# Patient Record
Sex: Female | Born: 1950
Health system: Southern US, Community
[De-identification: ages and names within clinical notes are randomized; demographics above are authoritative.]

## PROBLEM LIST (undated history)

## (undated) DIAGNOSIS — S0990XA Unspecified injury of head, initial encounter: Secondary | ICD-10-CM

## (undated) DIAGNOSIS — H269 Unspecified cataract: Secondary | ICD-10-CM

## (undated) DIAGNOSIS — J301 Allergic rhinitis due to pollen: Secondary | ICD-10-CM

## (undated) DIAGNOSIS — F988 Other specified behavioral and emotional disorders with onset usually occurring in childhood and adolescence: Secondary | ICD-10-CM

## (undated) DIAGNOSIS — R413 Other amnesia: Secondary | ICD-10-CM

## (undated) DIAGNOSIS — D649 Anemia, unspecified: Secondary | ICD-10-CM

## (undated) DIAGNOSIS — G2581 Restless legs syndrome: Secondary | ICD-10-CM

## (undated) DIAGNOSIS — G4730 Sleep apnea, unspecified: Secondary | ICD-10-CM

## (undated) DIAGNOSIS — G4733 Obstructive sleep apnea (adult) (pediatric): Secondary | ICD-10-CM

## (undated) DIAGNOSIS — Z9989 Dependence on other enabling machines and devices: Secondary | ICD-10-CM

## (undated) DIAGNOSIS — K219 Gastro-esophageal reflux disease without esophagitis: Secondary | ICD-10-CM

## (undated) DIAGNOSIS — E785 Hyperlipidemia, unspecified: Secondary | ICD-10-CM

## (undated) DIAGNOSIS — G3184 Mild cognitive impairment, so stated: Secondary | ICD-10-CM

## (undated) DIAGNOSIS — F32A Depression, unspecified: Secondary | ICD-10-CM

## (undated) DIAGNOSIS — G43909 Migraine, unspecified, not intractable, without status migrainosus: Secondary | ICD-10-CM

## (undated) DIAGNOSIS — R0981 Nasal congestion: Secondary | ICD-10-CM

## (undated) DIAGNOSIS — F329 Major depressive disorder, single episode, unspecified: Secondary | ICD-10-CM

## (undated) DIAGNOSIS — M810 Age-related osteoporosis without current pathological fracture: Secondary | ICD-10-CM

## (undated) DIAGNOSIS — N189 Chronic kidney disease, unspecified: Secondary | ICD-10-CM

## (undated) DIAGNOSIS — K760 Fatty (change of) liver, not elsewhere classified: Secondary | ICD-10-CM

## (undated) DIAGNOSIS — IMO0001 Reserved for inherently not codable concepts without codable children: Secondary | ICD-10-CM

## (undated) DIAGNOSIS — K589 Irritable bowel syndrome without diarrhea: Secondary | ICD-10-CM

## (undated) HISTORY — DX: Fatty (change of) liver, not elsewhere classified: K76.0

## (undated) HISTORY — DX: Hyperlipidemia, unspecified: E78.5

## (undated) HISTORY — DX: Other amnesia: R41.3

## (undated) HISTORY — DX: Anemia, unspecified: D64.9

## (undated) HISTORY — DX: Gastro-esophageal reflux disease without esophagitis: K21.9

## (undated) HISTORY — DX: Irritable bowel syndrome, unspecified: K58.9

## (undated) HISTORY — DX: Nasal congestion: R09.81

## (undated) HISTORY — DX: Unspecified injury of head, initial encounter: S09.90XA

## (undated) HISTORY — DX: Depression, unspecified: F32.A

## (undated) HISTORY — DX: Unspecified cataract: H26.9

## (undated) HISTORY — DX: Restless legs syndrome: G25.81

## (undated) HISTORY — DX: Age-related osteoporosis without current pathological fracture: M81.0

## (undated) HISTORY — DX: Sleep apnea, unspecified: G47.30

## (undated) HISTORY — PX: OTHER SURGICAL HISTORY: SHX169

## (undated) HISTORY — DX: Major depressive disorder, single episode, unspecified: F32.9

## (undated) HISTORY — PX: URETEROSCOPY: SHX842

## (undated) HISTORY — DX: Obstructive sleep apnea (adult) (pediatric): G47.33

## (undated) HISTORY — PX: NASAL SINUS SURGERY: SHX719

## (undated) HISTORY — DX: Dependence on other enabling machines and devices: Z99.89

## (undated) HISTORY — PX: JOINT REPLACEMENT: SHX530

## (undated) HISTORY — DX: Migraine, unspecified, not intractable, without status migrainosus: G43.909

## (undated) HISTORY — PX: APPENDECTOMY: SHX54

## (undated) HISTORY — DX: Reserved for inherently not codable concepts without codable children: IMO0001

## (undated) HISTORY — DX: Allergic rhinitis due to pollen: J30.1

## (undated) HISTORY — PX: COSMETIC SURGERY: SHX468

## (undated) HISTORY — PX: ABDOMINAL HYSTERECTOMY: SHX81

## (undated) HISTORY — DX: Mild cognitive impairment of uncertain or unknown etiology: G31.84

## (undated) HISTORY — PX: FRACTURE SURGERY: SHX138

## (undated) HISTORY — PX: TONSILLECTOMY AND ADENOIDECTOMY: SHX28

## (undated) HISTORY — DX: Chronic kidney disease, unspecified: N18.9

## (undated) HISTORY — PX: EYE SURGERY: SHX253

## (undated) HISTORY — DX: Other specified behavioral and emotional disorders with onset usually occurring in childhood and adolescence: F98.8

---

## 1998-02-23 ENCOUNTER — Emergency Department (HOSPITAL_COMMUNITY): Admission: EM | Admit: 1998-02-23 | Discharge: 1998-02-23 | Payer: Self-pay | Admitting: Emergency Medicine

## 1999-04-26 ENCOUNTER — Other Ambulatory Visit: Admission: RE | Admit: 1999-04-26 | Discharge: 1999-04-26 | Payer: Self-pay | Admitting: Gynecology

## 1999-06-03 ENCOUNTER — Other Ambulatory Visit: Admission: RE | Admit: 1999-06-03 | Discharge: 1999-06-03 | Payer: Self-pay | Admitting: Gynecology

## 2000-01-02 ENCOUNTER — Encounter (INDEPENDENT_AMBULATORY_CARE_PROVIDER_SITE_OTHER): Payer: Self-pay

## 2000-01-02 ENCOUNTER — Other Ambulatory Visit: Admission: RE | Admit: 2000-01-02 | Discharge: 2000-01-02 | Payer: Self-pay | Admitting: Gynecology

## 2000-05-11 ENCOUNTER — Other Ambulatory Visit: Admission: RE | Admit: 2000-05-11 | Discharge: 2000-05-11 | Payer: Self-pay | Admitting: Gynecology

## 2000-05-27 ENCOUNTER — Encounter: Admission: RE | Admit: 2000-05-27 | Discharge: 2000-05-27 | Payer: Self-pay | Admitting: Gynecology

## 2000-05-27 ENCOUNTER — Encounter: Payer: Self-pay | Admitting: Gynecology

## 2001-08-31 ENCOUNTER — Other Ambulatory Visit: Admission: RE | Admit: 2001-08-31 | Discharge: 2001-08-31 | Payer: Self-pay | Admitting: Gynecology

## 2002-03-21 ENCOUNTER — Encounter: Payer: Self-pay | Admitting: Internal Medicine

## 2002-03-21 ENCOUNTER — Encounter: Admission: RE | Admit: 2002-03-21 | Discharge: 2002-03-21 | Payer: Self-pay | Admitting: Internal Medicine

## 2003-01-24 ENCOUNTER — Other Ambulatory Visit: Admission: RE | Admit: 2003-01-24 | Discharge: 2003-01-24 | Payer: Self-pay | Admitting: Gynecology

## 2004-09-26 ENCOUNTER — Other Ambulatory Visit: Admission: RE | Admit: 2004-09-26 | Discharge: 2004-09-26 | Payer: Self-pay | Admitting: Gynecology

## 2004-12-29 ENCOUNTER — Emergency Department (HOSPITAL_COMMUNITY): Admission: EM | Admit: 2004-12-29 | Discharge: 2004-12-29 | Payer: Self-pay | Admitting: Emergency Medicine

## 2008-04-27 ENCOUNTER — Ambulatory Visit (HOSPITAL_COMMUNITY): Admission: RE | Admit: 2008-04-27 | Discharge: 2008-04-27 | Payer: Self-pay | Admitting: Internal Medicine

## 2008-07-17 ENCOUNTER — Other Ambulatory Visit: Admission: RE | Admit: 2008-07-17 | Discharge: 2008-07-17 | Payer: Self-pay | Admitting: Internal Medicine

## 2008-08-03 ENCOUNTER — Ambulatory Visit (HOSPITAL_COMMUNITY): Admission: RE | Admit: 2008-08-03 | Discharge: 2008-08-03 | Payer: Self-pay | Admitting: Internal Medicine

## 2008-08-07 ENCOUNTER — Ambulatory Visit (HOSPITAL_COMMUNITY): Admission: RE | Admit: 2008-08-07 | Discharge: 2008-08-07 | Payer: Self-pay | Admitting: Internal Medicine

## 2011-02-22 ENCOUNTER — Emergency Department (HOSPITAL_COMMUNITY)
Admission: EM | Admit: 2011-02-22 | Discharge: 2011-02-22 | Disposition: A | Discharge status: Home / Self Care | Payer: BC Managed Care – PPO | Attending: Emergency Medicine | Admitting: Emergency Medicine

## 2011-02-22 ENCOUNTER — Emergency Department (HOSPITAL_COMMUNITY): Payer: BC Managed Care – PPO

## 2011-02-22 DIAGNOSIS — R1031 Right lower quadrant pain: Secondary | ICD-10-CM | POA: Insufficient documentation

## 2011-02-22 DIAGNOSIS — K219 Gastro-esophageal reflux disease without esophagitis: Secondary | ICD-10-CM | POA: Insufficient documentation

## 2011-02-22 DIAGNOSIS — R109 Unspecified abdominal pain: Secondary | ICD-10-CM | POA: Insufficient documentation

## 2011-02-22 DIAGNOSIS — T148XXA Other injury of unspecified body region, initial encounter: Secondary | ICD-10-CM | POA: Insufficient documentation

## 2011-02-22 DIAGNOSIS — F329 Major depressive disorder, single episode, unspecified: Secondary | ICD-10-CM | POA: Insufficient documentation

## 2011-02-22 DIAGNOSIS — X58XXXA Exposure to other specified factors, initial encounter: Secondary | ICD-10-CM | POA: Insufficient documentation

## 2011-02-22 DIAGNOSIS — F3289 Other specified depressive episodes: Secondary | ICD-10-CM | POA: Insufficient documentation

## 2011-02-22 DIAGNOSIS — Z87442 Personal history of urinary calculi: Secondary | ICD-10-CM | POA: Insufficient documentation

## 2011-02-22 LAB — URINALYSIS, ROUTINE W REFLEX MICROSCOPIC
Nitrite: NEGATIVE
Protein, ur: NEGATIVE mg/dL
Specific Gravity, Urine: 1.039 — ABNORMAL HIGH (ref 1.005–1.030)
Urobilinogen, UA: 1 mg/dL (ref 0.0–1.0)

## 2011-02-22 LAB — DIFFERENTIAL
Basophils Absolute: 0 10*3/uL (ref 0.0–0.1)
Eosinophils Absolute: 0.1 10*3/uL (ref 0.0–0.7)
Eosinophils Relative: 1 % (ref 0–5)
Lymphocytes Relative: 35 % (ref 12–46)
Monocytes Absolute: 0.7 10*3/uL (ref 0.1–1.0)

## 2011-02-22 LAB — POCT I-STAT, CHEM 8
Calcium, Ion: 1.24 mmol/L (ref 1.12–1.32)
Creatinine, Ser: 0.8 mg/dL (ref 0.50–1.10)
Glucose, Bld: 88 mg/dL (ref 70–99)
HCT: 39 % (ref 36.0–46.0)
Hemoglobin: 13.3 g/dL (ref 12.0–15.0)
Potassium: 4.1 mEq/L (ref 3.5–5.1)

## 2011-02-22 LAB — URINE MICROSCOPIC-ADD ON

## 2011-02-22 LAB — CBC
HCT: 38 % (ref 36.0–46.0)
MCHC: 32.4 g/dL (ref 30.0–36.0)
MCV: 79.7 fL (ref 78.0–100.0)
Platelets: 327 10*3/uL (ref 150–400)
RDW: 14.6 % (ref 11.5–15.5)
WBC: 7.5 10*3/uL (ref 4.0–10.5)

## 2011-09-02 ENCOUNTER — Encounter: Payer: Self-pay | Admitting: Internal Medicine

## 2011-09-23 ENCOUNTER — Encounter: Payer: Self-pay | Admitting: Internal Medicine

## 2011-09-23 ENCOUNTER — Ambulatory Visit (AMBULATORY_SURGERY_CENTER): Payer: BC Managed Care – PPO

## 2011-09-23 VITALS — Ht 67.0 in | Wt 180.9 lb

## 2011-09-23 DIAGNOSIS — Z1211 Encounter for screening for malignant neoplasm of colon: Secondary | ICD-10-CM

## 2011-09-23 DIAGNOSIS — D649 Anemia, unspecified: Secondary | ICD-10-CM

## 2011-09-23 MED ORDER — PEG-KCL-NACL-NASULF-NA ASC-C 100 G PO SOLR: 1.0000 | Freq: Once | ORAL | Status: AC

## 2011-10-07 ENCOUNTER — Other Ambulatory Visit: Payer: BC Managed Care – PPO | Admitting: Internal Medicine

## 2011-10-09 ENCOUNTER — Telehealth: Payer: Self-pay | Admitting: Internal Medicine

## 2011-10-09 DIAGNOSIS — Z1211 Encounter for screening for malignant neoplasm of colon: Secondary | ICD-10-CM

## 2011-10-09 DIAGNOSIS — D649 Anemia, unspecified: Secondary | ICD-10-CM

## 2011-10-09 MED ORDER — PEG-KCL-NACL-NASULF-NA ASC-C 100 G PO SOLR: ORAL | Status: DC

## 2011-10-09 NOTE — Telephone Encounter (Signed)
Pt left her Moviprep in [place].  She needs the Moviprep sent to [facility] on [address].  Rx sent to [facility] as directed.  Pharmacist had questioned if Moviprep could be given as a generic; they were out of the name brand Moviprep.  Left message at pharmacy that pt is to have the name brand Moviprep.    Also, pt left her instructions in [place].  Writer reviewed prep instructions via phone and understanding voiced.

## 2011-10-13 ENCOUNTER — Ambulatory Visit (AMBULATORY_SURGERY_CENTER): Payer: BC Managed Care – PPO | Admitting: Internal Medicine

## 2011-10-13 ENCOUNTER — Encounter: Payer: Self-pay | Admitting: Internal Medicine

## 2011-10-13 VITALS — BP 124/72 | HR 65 | Temp 97.2°F | Resp 20 | Ht 67.0 in | Wt 180.0 lb

## 2011-10-13 DIAGNOSIS — Z1211 Encounter for screening for malignant neoplasm of colon: Secondary | ICD-10-CM

## 2011-10-13 DIAGNOSIS — D126 Benign neoplasm of colon, unspecified: Secondary | ICD-10-CM

## 2011-10-13 DIAGNOSIS — D128 Benign neoplasm of rectum: Secondary | ICD-10-CM

## 2011-10-13 DIAGNOSIS — D129 Benign neoplasm of anus and anal canal: Secondary | ICD-10-CM

## 2011-10-13 MED ORDER — SODIUM CHLORIDE 0.9 % IV SOLN: 500.0000 mL | INTRAVENOUS | Status: DC

## 2011-10-13 NOTE — Patient Instructions (Signed)
YOU HAD AN ENDOSCOPIC PROCEDURE TODAY AT THE [facility]: Refer to the procedure report that was given to you for any specific questions about what was found during the examination.  If the procedure report does not answer your questions, please call your gastroenterologist to clarify.  If you requested that your care partner not be given the details of your procedure findings, then the procedure report has been included in a sealed envelope for you to review at your convenience later.  YOU SHOULD EXPECT: Some feelings of bloating in the abdomen. Passage of more gas than usual.  Walking can help get rid of the air that was put into your GI tract during the procedure and reduce the bloating. If you had a lower endoscopy (such as a colonoscopy or flexible sigmoidoscopy) you may notice spotting of blood in your stool or on the toilet paper. If you underwent a bowel prep for your procedure, then you may not have a normal bowel movement for a few days.  DIET: Your first meal following the procedure should be a light meal and then it is ok to progress to your normal diet.  A half-sandwich or bowl of soup is an example of a good first meal.  Heavy or fried foods are harder to digest and may make you feel nauseous or bloated.  Likewise meals heavy in dairy and vegetables can cause extra gas to form and this can also increase the bloating.  Drink plenty of fluids but you should avoid alcoholic beverages for 24 hours.  ACTIVITY: Your care partner should take you home directly after the procedure.  You should plan to take it easy, moving slowly for the rest of the day.  You can resume normal activity the day after the procedure however you should NOT DRIVE or use heavy machinery for 24 hours (because of the sedation medicines used during the test).    SYMPTOMS TO REPORT IMMEDIATELY: A gastroenterologist can be reached at any hour.  During normal business hours, 8:30 AM to 5:00 PM Monday through Friday,  call [phone].  After hours and on weekends, please call the GI answering service at [phone] who will take a message and have the physician on call contact you.   Following lower endoscopy (colonoscopy or flexible sigmoidoscopy):  Excessive amounts of blood in the stool  Significant tenderness or worsening of abdominal pains  Swelling of the abdomen that is new, acute  Fever of 100F or higher    FOLLOW UP: If any biopsies were taken you will be contacted by phone or by letter within the next 1-3 weeks.  Call your gastroenterologist if you have not heard about the biopsies in 3 weeks.  Our staff will call the home number listed on your records the next business day following your procedure to check on you and address any questions or concerns that you may have at that time regarding the information given to you following your procedure. This is a courtesy call and so if there is no answer at the home number and we have not heard from you through the emergency physician on call, we will assume that you have returned to your regular daily activities without incident.  SIGNATURES/CONFIDENTIALITY: You and/or your care partner have signed paperwork which will be entered into your electronic medical record.  These signatures attest to the fact that that the information above on your After Visit Summary has been reviewed and is understood.  Full responsibility of the confidentiality   of this discharge information lies with you and/or your care-partner.     

## 2011-10-13 NOTE — Op Note (Signed)
[facility] [address] [place], [place]  [address]  COLONOSCOPY PROCEDURE REPORT  PATIENT:  Kimberly Ray, Kimberly Ray  MR#:  [id number] BIRTHDATE:  [birth year], 60 yrs. old  GENDER:  female ENDOSCOPIST:  Carie Caddy. Corby Villasenor, MD REF. BY:  Lucky Cowboy, M.D. PROCEDURE DATE:  10/13/2011 PROCEDURE:  Colon with cold biopsy polypectomy ASA CLASS:  Class II INDICATIONS:  Routine Risk Screening, 1st colonoscopy MEDICATIONS:   These medications were titrated to patient response per physician's verbal order, Versed 10 mg IV, Fentanyl 100 mcg IV  DESCRIPTION OF PROCEDURE:   After the risks benefits and alternatives of the procedure were thoroughly explained, informed consent was obtained.  Digital rectal exam was performed and revealed no rectal masses.   The LB [id number]-[id number] endoscope was introduced through the anus and advanced to the terminal ileum which was intubated for a short distance, without limitations. The quality of the prep was good, using MoviPrep.  The instrument was then slowly withdrawn as the colon was fully examined. <<PROCEDUREIMAGES>>  FINDINGS:  The terminal ileum appeared normal.  Moderate diverticulosis was found in the left colon.  A sessile polyp was found in the rectum. The polyp was removed using cold biopsy forceps.   Retroflexed views in the rectum revealed no abnormalities.    The time to cecum =  minutes. The scope was then withdrawn in  minutes from the cecum and the procedure completed.  COMPLICATIONS:  None  ENDOSCOPIC IMPRESSION: 1) Normal terminal ileum 2) Moderate diverticulosis in the left colon 3) Sessile polyp in the rectum.  Removed and sent to pathology.  RECOMMENDATIONS: 1) Await pathology results 2) High fiber diet. 3) If the polyps removed today is proven to be an adenomatous (pre-cancerous) polyp, you will need a repeat colonoscopy in 5 years. Otherwise you should continue to follow colorectal cancer screening guidelines for  "routine risk" patients with colonoscopy in 10 years. You will receive a letter within 1-2 weeks with the results of your biopsy as well as final recommendations. Please call my office if you have not received a letter after 3 weeks. 4) Per patient report: if anemia as recently reported to the patient by her PCP office is found to be iron deficiency, then would consider EGD as next diagnostic step.  Carie Caddy. Rhea Belton, MD  CC:  Lucky Cowboy, MD The Patient  n. eSIGNED:   Carie Caddy. Olia Hinderliter at 10/13/2011 11:21 AM  Damaris Hippo, [phone]

## 2011-10-13 NOTE — Progress Notes (Signed)
Patient did not experience any of the following events: a burn prior to discharge; a fall within the facility; wrong site/side/patient/procedure/implant event; or a hospital transfer or hospital admission upon discharge from the facility. (G8907) Patient did not have preoperative order for IV antibiotic SSI prophylaxis. (G8918)  

## 2011-10-14 ENCOUNTER — Telehealth: Payer: Self-pay | Admitting: *Deleted

## 2011-10-14 NOTE — Telephone Encounter (Signed)
  Follow up Call-  Call back number 10/13/2011  Post procedure Call Back phone  # [phone]  Permission to leave phone message Yes     Patient questions:  Do you have a fever, pain , or abdominal swelling? no Pain Score  0 *  Have you tolerated food without any problems? yes  Have you been able to return to your normal activities? yes  Do you have any questions about your discharge instructions: Diet   no Medications  no Follow up visit  no  Do you have questions or concerns about your Care? no  Actions: * If pain score is 4 or above: No action needed, pain <4.

## 2011-10-20 ENCOUNTER — Encounter: Payer: Self-pay | Admitting: Internal Medicine

## 2013-03-02 ENCOUNTER — Other Ambulatory Visit (HOSPITAL_COMMUNITY): Payer: Self-pay | Admitting: Internal Medicine

## 2013-03-02 DIAGNOSIS — Z1231 Encounter for screening mammogram for malignant neoplasm of breast: Secondary | ICD-10-CM

## 2013-03-09 ENCOUNTER — Encounter: Payer: Self-pay | Admitting: Neurology

## 2013-03-09 DIAGNOSIS — R413 Other amnesia: Secondary | ICD-10-CM

## 2013-03-09 DIAGNOSIS — S0990XA Unspecified injury of head, initial encounter: Secondary | ICD-10-CM

## 2013-03-10 ENCOUNTER — Other Ambulatory Visit: Payer: Self-pay | Admitting: Neurology

## 2013-03-10 ENCOUNTER — Encounter: Payer: Self-pay | Admitting: Neurology

## 2013-03-10 ENCOUNTER — Ambulatory Visit (INDEPENDENT_AMBULATORY_CARE_PROVIDER_SITE_OTHER): Payer: 59 | Admitting: Neurology

## 2013-03-10 VITALS — BP 120/66 | HR 68 | Ht 67.0 in | Wt 173.5 lb

## 2013-03-10 DIAGNOSIS — S0990XA Unspecified injury of head, initial encounter: Secondary | ICD-10-CM

## 2013-03-10 DIAGNOSIS — R413 Other amnesia: Secondary | ICD-10-CM
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
## 2013-03-11 LAB — THYROID PANEL WITH TSH: Free Thyroxine Index: 1.7 (ref 1.2–4.9)

## 2013-03-11 LAB — ANA W/REFLEX IF POSITIVE: Anti Nuclear Antibody(ANA): NEGATIVE

## 2013-03-14 NOTE — Progress Notes (Signed)
Quick Note:  Spoke with patient and relayed results of blood work. Patient understood and had no questions.  ______ 

## 2013-03-16 ENCOUNTER — Ambulatory Visit (INDEPENDENT_AMBULATORY_CARE_PROVIDER_SITE_OTHER): Payer: 59

## 2013-03-16 DIAGNOSIS — S0990XA Unspecified injury of head, initial encounter: Secondary | ICD-10-CM

## 2013-03-16 DIAGNOSIS — R413 Other amnesia: Secondary | ICD-10-CM

## 2013-03-17 NOTE — Progress Notes (Signed)
Quick Note:  Spoke with patient and relayed the results of their MRI. The patient was also reminded of any future appointments. The patient understood and had no questions.  ______ 

## 2013-04-08 DIAGNOSIS — G4733 Obstructive sleep apnea (adult) (pediatric): Secondary | ICD-10-CM

## 2013-04-08 DIAGNOSIS — G4761 Periodic limb movement disorder: Secondary | ICD-10-CM

## 2013-05-03 ENCOUNTER — Ambulatory Visit (HOSPITAL_COMMUNITY): Payer: BC Managed Care – PPO | Attending: Internal Medicine

## 2013-06-10 ENCOUNTER — Ambulatory Visit: Payer: 59 | Admitting: Nurse Practitioner

## 2013-06-28 ENCOUNTER — Encounter: Payer: Self-pay | Admitting: Internal Medicine

## 2013-06-29 ENCOUNTER — Ambulatory Visit: Payer: Self-pay | Admitting: Emergency Medicine

## 2013-06-29 ENCOUNTER — Encounter: Payer: Self-pay | Admitting: Emergency Medicine

## 2013-06-29 ENCOUNTER — Encounter: Payer: Managed Care, Other (non HMO) | Admitting: Emergency Medicine

## 2013-06-29 ENCOUNTER — Ambulatory Visit: Payer: Managed Care, Other (non HMO) | Admitting: Emergency Medicine

## 2013-06-29 VITALS — BP 122/68 | HR 82 | Temp 97.8°F | Resp 18 | Ht 67.0 in | Wt 161.0 lb

## 2013-06-29 DIAGNOSIS — F988 Other specified behavioral and emotional disorders with onset usually occurring in childhood and adolescence: Secondary | ICD-10-CM | POA: Insufficient documentation

## 2013-06-29 DIAGNOSIS — E559 Vitamin D deficiency, unspecified: Secondary | ICD-10-CM

## 2013-06-29 DIAGNOSIS — G43909 Migraine, unspecified, not intractable, without status migrainosus: Secondary | ICD-10-CM

## 2013-06-29 DIAGNOSIS — E782 Mixed hyperlipidemia: Secondary | ICD-10-CM

## 2013-06-29 DIAGNOSIS — F411 Generalized anxiety disorder: Secondary | ICD-10-CM

## 2013-06-29 DIAGNOSIS — R7309 Other abnormal glucose: Secondary | ICD-10-CM

## 2013-06-29 LAB — BASIC METABOLIC PANEL WITH GFR
CO2: 28 mEq/L (ref 19–32)
Glucose, Bld: 97 mg/dL (ref 70–99)
Potassium: 4.5 mEq/L (ref 3.5–5.3)
Sodium: 143 mEq/L (ref 135–145)

## 2013-06-29 LAB — CBC WITH DIFFERENTIAL/PLATELET
Eosinophils Relative: 1 % (ref 0–5)
HCT: 39.8 % (ref 36.0–46.0)
Hemoglobin: 13.6 g/dL (ref 12.0–15.0)
Lymphocytes Relative: 36 % (ref 12–46)
Lymphs Abs: 2.1 10*3/uL (ref 0.7–4.0)
MCV: 85.4 fL (ref 78.0–100.0)
Monocytes Absolute: 0.5 10*3/uL (ref 0.1–1.0)
Neutrophils Relative %: 54 % (ref 43–77)
Platelets: 303 10*3/uL (ref 150–400)
RBC: 4.66 MIL/uL (ref 3.87–5.11)
WBC: 5.9 10*3/uL (ref 4.0–10.5)

## 2013-06-29 LAB — LIPID PANEL
Cholesterol: 212 mg/dL — ABNORMAL HIGH (ref 0–200)
HDL: 41 mg/dL (ref >39)
Triglycerides: 322 mg/dL — ABNORMAL HIGH (ref <150)

## 2013-06-29 LAB — HEPATIC FUNCTION PANEL
ALT: 18 U/L (ref 0–35)
AST: 21 U/L (ref 0–37)
Alkaline Phosphatase: 68 U/L (ref 39–117)
Bilirubin, Direct: 0.1 mg/dL (ref 0.0–0.3)
Indirect Bilirubin: 0.4 mg/dL (ref 0.0–0.9)

## 2013-06-29 MED ORDER — VILAZODONE HCL 20 MG PO TABS: 20.0000 mg | ORAL_TABLET | Freq: Every day | ORAL | Status: DC

## 2013-06-29 MED ORDER — ELETRIPTAN HYDROBROMIDE 20 MG PO TABS: 20.0000 mg | ORAL_TABLET | ORAL | Status: DC | PRN

## 2013-06-29 MED ORDER — METHYLPHENIDATE HCL ER (LA) 10 MG PO CP24: 10.0000 mg | ORAL_CAPSULE | Freq: Every day | ORAL | Status: DC

## 2013-06-29 NOTE — Progress Notes (Signed)
Subjective:    Patient ID: Kimberly Ray, female    DOB: 03-Aug-1951, 62 y.o.   MRN: [id number]  HPI Comments: 62 yo female presents for 3 month F/U for Anxiety, ADD, Cholesterol, Pre-Dm, D. Deficient. She has been eating healthier and lost 17 #. She has not added cardio outside of work but plans to in the future. She has done well with Metformin add on at last OV for elevated insulin level. She has been able to decrease Viibryd to 30 mg on some days and would like a refil of 20 mg in order to be able to continue lower dose. She feels better on Ritalin LA has been able to decrease dose occasionally.    Hyperlipidemia  Anxiety      Current Outpatient Prescriptions on File Prior to Visit  Medication Sig Dispense Refill  . b complex vitamins tablet Take 1 tablet by mouth daily.      . cholecalciferol (VITAMIN D) 1000 UNITS tablet Take 1,000 Units by mouth daily. Take 5,000 units daily      . CINNAMON PO Take by mouth daily.      . fish oil-omega-3 fatty acids 1000 MG capsule Take 1 g by mouth daily.      Marland Kitchen lamoTRIgine (LAMICTAL) 100 MG tablet Take 100 mg by mouth 2 (two) times daily.      . magnesium 30 MG tablet Take 30 mg by mouth daily.      . metFORMIN (GLUCOPHAGE) 500 MG tablet       . Multiple Vitamin (MULTIVITAMIN) tablet Take 1 tablet by mouth daily. Multi-vitamin for Women-Take 1 daily      . NON FORMULARY Tumeric-Take 1 daily      . pantoprazole (PROTONIX) 40 MG tablet Take 40 mg by mouth daily.      Marland Kitchen selenium 50 MCG TABS Take 50 mcg by mouth daily.      Marland Kitchen VIIBRYD 40 MG TABS Take 20 mg by mouth daily.        No current facility-administered medications on file prior to visit.   ALLERGIES Erythromycin; Ivp dye; Penicillins; Clarithromycin; Macrodantin; and Wellbutrin  Past Medical History  Diagnosis Date  . Depression   . Migraines   . GERD (gastroesophageal reflux disease)   . Anemia   . Sinus congestion   . Memory loss   . Head trauma   . Hay fever       Review of Systems  All other systems reviewed and are negative.       Objective:   Physical Exam  Nursing note and vitals reviewed. Constitutional: She is oriented to person, place, and time. She appears well-developed and well-nourished.  HENT:  Head: Normocephalic and atraumatic.  Right Ear: External ear normal.  Left Ear: External ear normal.  Nose: Nose normal.  Mouth/Throat: Oropharynx is clear and moist.  Eyes: Conjunctivae and EOM are normal. Pupils are equal, round, and reactive to light.  Neck: Normal range of motion. Neck supple. No JVD present. No thyromegaly present.  Cardiovascular: Normal rate, regular rhythm, normal heart sounds and intact distal pulses.   Pulmonary/Chest: Effort normal and breath sounds normal.  Abdominal: Soft. Bowel sounds are normal.  Musculoskeletal: Normal range of motion.  Lymphadenopathy:    She has no cervical adenopathy.  Neurological: She is alert and oriented to person, place, and time.  Skin: Skin is warm and dry.  Psychiatric: She has a normal mood and affect. Judgment normal.  Assessment & Plan:  1. 3 month F/U for ADD, Anxiety, Cholesterol, Pre-Dm, D. Deficient check labs. Give needed refills AD in chart. Continue healthy diet and exercise.

## 2013-06-29 NOTE — Patient Instructions (Signed)
Fat and Cholesterol Control Diet Fat and cholesterol levels in your blood and organs are influenced by your diet. High levels of fat and cholesterol may lead to diseases of the heart, small and large blood vessels, gallbladder, liver, and pancreas. CONTROLLING FAT AND CHOLESTEROL WITH DIET Although exercise and lifestyle factors are important, your diet is key. That is because certain foods are known to raise cholesterol and others to lower it. The goal is to balance foods for their effect on cholesterol and more importantly, to replace saturated and trans fat with other types of fat, such as monounsaturated fat, polyunsaturated fat, and omega-3 fatty acids. On average, a person should consume no more than 15 to 17 g of saturated fat daily. Saturated and trans fats are considered "bad" fats, and they will raise LDL cholesterol. Saturated fats are primarily found in animal products such as meats, butter, and cream. However, that does not mean you need to give up all your favorite foods. Today, there are good tasting, low-fat, low-cholesterol substitutes for most of the things you like to eat. Choose low-fat or nonfat alternatives. Choose round or loin cuts of red meat. These types of cuts are lowest in fat and cholesterol. Chicken (without the skin), fish, veal, and ground turkey breast are great choices. Eliminate fatty meats, such as hot dogs and salami. Even shellfish have little or no saturated fat. Have a 3 oz (85 g) portion when you eat lean meat, poultry, or fish. Trans fats are also called "partially hydrogenated oils." They are oils that have been scientifically manipulated so that they are solid at room temperature resulting in a longer shelf life and improved taste and texture of foods in which they are added. Trans fats are found in stick margarine, some tub margarines, cookies, crackers, and baked goods.  When baking and cooking, oils are a great substitute for butter. The monounsaturated oils are  especially beneficial since it is believed they lower LDL and raise HDL. The oils you should avoid entirely are saturated tropical oils, such as coconut and palm.  Remember to eat a lot from food groups that are naturally free of saturated and trans fat, including fish, fruit, vegetables, beans, grains (barley, rice, couscous, bulgur wheat), and pasta (without cream sauces).  IDENTIFYING FOODS THAT LOWER FAT AND CHOLESTEROL  Soluble fiber may lower your cholesterol. This type of fiber is found in fruits such as apples, vegetables such as broccoli, potatoes, and carrots, legumes such as beans, peas, and lentils, and grains such as barley. Foods fortified with plant sterols (phytosterol) may also lower cholesterol. You should eat at least 2 g per day of these foods for a cholesterol lowering effect.  Read package labels to identify low-saturated fats, trans fat free, and low-fat foods at the supermarket. Select cheeses that have only 2 to 3 g saturated fat per ounce. Use a heart-healthy tub margarine that is free of trans fats or partially hydrogenated oil. When buying baked goods (cookies, crackers), avoid partially hydrogenated oils. Breads and muffins should be made from whole grains (whole-wheat or whole oat flour, instead of "flour" or "enriched flour"). Buy non-creamy canned soups with reduced salt and no added fats.  FOOD PREPARATION TECHNIQUES  Never deep-fry. If you must fry, either stir-fry, which uses very little fat, or use non-stick cooking sprays. When possible, broil, bake, or roast meats, and steam vegetables. Instead of putting butter or margarine on vegetables, use lemon and herbs, applesauce, and cinnamon (for squash and sweet potatoes). Use nonfat   yogurt, salsa, and low-fat dressings for salads.  LOW-SATURATED FAT / LOW-FAT FOOD SUBSTITUTES Meats / Saturated Fat (g)  Avoid: Steak, marbled (3 oz/85 g) / 11 g  Choose: Steak, lean (3 oz/85 g) / 4 g  Avoid: Hamburger (3 oz/85 g) / 7  g  Choose: Hamburger, lean (3 oz/85 g) / 5 g  Avoid: Ham (3 oz/85 g) / 6 g  Choose: Ham, lean cut (3 oz/85 g) / 2.4 g  Avoid: Chicken, with skin, dark meat (3 oz/85 g) / 4 g  Choose: Chicken, skin removed, dark meat (3 oz/85 g) / 2 g  Avoid: Chicken, with skin, light meat (3 oz/85 g) / 2.5 g  Choose: Chicken, skin removed, light meat (3 oz/85 g) / 1 g Dairy / Saturated Fat (g)  Avoid: Whole milk (1 cup) / 5 g  Choose: Low-fat milk, 2% (1 cup) / 3 g  Choose: Low-fat milk, 1% (1 cup) / 1.5 g  Choose: Skim milk (1 cup) / 0.3 g  Avoid: Hard cheese (1 oz/28 g) / 6 g  Choose: Skim milk cheese (1 oz/28 g) / 2 to 3 g  Avoid: Cottage cheese, 4% fat (1 cup) / 6.5 g  Choose: Low-fat cottage cheese, 1% fat (1 cup) / 1.5 g  Avoid: Ice cream (1 cup) / 9 g  Choose: Sherbet (1 cup) / 2.5 g  Choose: Nonfat frozen yogurt (1 cup) / 0.3 g  Choose: Frozen fruit bar / trace  Avoid: Whipped cream (1 tbs) / 3.5 g  Choose: Nondairy whipped topping (1 tbs) / 1 g Condiments / Saturated Fat (g)  Avoid: Mayonnaise (1 tbs) / 2 g  Choose: Low-fat mayonnaise (1 tbs) / 1 g  Avoid: Butter (1 tbs) / 7 g  Choose: Extra light margarine (1 tbs) / 1 g  Avoid: Coconut oil (1 tbs) / 11.8 g  Choose: Olive oil (1 tbs) / 1.8 g  Choose: Corn oil (1 tbs) / 1.7 g  Choose: Safflower oil (1 tbs) / 1.2 g  Choose: Sunflower oil (1 tbs) / 1.4 g  Choose: Soybean oil (1 tbs) / 2.4 g  Choose: Canola oil (1 tbs) / 1 g Document Released: 08/04/2005 Document Revised: 11/29/2012 Document Reviewed: 01/23/2011 [employer] Patient Information 2014 [place], [place]. Ataques de Ansiedade e Pnico (Anxiety and Panic Attacks) O mdico informou que voc est tendo um ataque de ansiedade ou de pnico, que pode se apresentar de vrias formas. Na [employer] vezes, o incio desses ataques  espontneo. Eles ocorrem a qualquer hora do dia, incluindo os perodos do sono e a Research officer, trade union. So repentinos,  fortes e inexplicados. Embora os ataques de pnico sejam bem assustadores, eles so fisicamente inofensivos. Por vezes, a causa de sua ansiedade  desconhecida. A ansiedade  um mecanismo protetor do corpo em seu mecanismo da luta ou do voo. A maior parte destas situaes de perigo visveis so, na verdade, situaes no fsicas (como por exemplo, a ansiedade por perder o trabalho). CAUSAS As causas da ansiedade ou ataques de pnico so vrias. Os ataques de pnico ocorrem em pessoas saudveis em certas circunstncias. Pode haver uma causa gentica para os ataques de pnico. A ansiedade pode tambm ser um efeito secundrio de alguns medicamentos. SINTOMAS Alguns dos sentimentos recorrentes mais comuns so:  Intenso terror.  Palpitaes e dor no peito.  Tontura, sentir que Optician, dispensing.  Sensaes de sufocamento e asfixia.  Sensaes de quente e frio.  Sentimento de uma desgraa iminente e  que a morte est prxima.  Medo de ficar louco(a).  Formigamento nas extremidades.  Sentimentos de Medical laboratory scientific officer.  Suor.  Tremores, estremecimentos.  Realidade alterada (desrealizao).  Ruptura com a personalidade (despersonalizao). Todos esses sintomas mais comuns e outros podem ser combinados para criar ataques de pnico. DIAGNSTICO A avaliao do mdico depende do tipo de sintomas que o paciente experimenta. O diagnstico de ansiedade ou ataque de pnico  feito quando no  possvel determinar uma doena fsica como causa dos sintomas. TRATAMENTO O tratamento para prevenir a ansiedade e os ataques de pnico podem incluir:  Fuga de circunstncias que causam ansiedade.  Tranquilizao e relaxamento.  Prtica regular de exerccios fsicos.  Terapias de relaxamento, como ioga.  Psicoterapia com um psiquiatra ou terapeuta.  Evaso  cafena, lcool e drogas ilegais.  Medicamentos vendidos com receita mdica. PROCURE UM MDICO IMEDIATAMENTE SE:  Se experimentar sintomas de  ataque de pnico diferentes dos sintomas habituais.  Tiver sintomas piorando ou preocupantes. Document Released: 08/04/2005 Document Revised: 10/27/2011 [facility] Patient Information 2014 [place], [place].

## 2013-06-30 LAB — VITAMIN D 25 HYDROXY (VIT D DEFICIENCY, FRACTURES): Vit D, 25-Hydroxy: 52 ng/mL (ref 30–89)

## 2013-06-30 LAB — HEMOGLOBIN A1C: Hgb A1c MFr Bld: 5.8 % — ABNORMAL HIGH (ref <5.7)

## 2013-07-27 ENCOUNTER — Ambulatory Visit (INDEPENDENT_AMBULATORY_CARE_PROVIDER_SITE_OTHER): Payer: 59 | Admitting: Emergency Medicine

## 2013-07-27 ENCOUNTER — Encounter: Payer: Self-pay | Admitting: Emergency Medicine

## 2013-07-27 VITALS — BP 116/84 | HR 82 | Temp 98.2°F | Resp 16 | Ht 67.0 in | Wt 165.0 lb

## 2013-07-27 DIAGNOSIS — G2581 Restless legs syndrome: Secondary | ICD-10-CM

## 2013-07-27 DIAGNOSIS — F411 Generalized anxiety disorder: Secondary | ICD-10-CM

## 2013-07-27 DIAGNOSIS — G43909 Migraine, unspecified, not intractable, without status migrainosus: Secondary | ICD-10-CM

## 2013-07-27 DIAGNOSIS — F988 Other specified behavioral and emotional disorders with onset usually occurring in childhood and adolescence: Secondary | ICD-10-CM

## 2013-07-27 DIAGNOSIS — G4733 Obstructive sleep apnea (adult) (pediatric): Secondary | ICD-10-CM

## 2013-07-27 DIAGNOSIS — K529 Noninfective gastroenteritis and colitis, unspecified: Secondary | ICD-10-CM

## 2013-07-27 MED ORDER — METHYLPHENIDATE HCL ER (LA) 10 MG PO CP24: 10.0000 mg | ORAL_CAPSULE | Freq: Three times a day (TID) | ORAL | Status: DC

## 2013-07-27 MED ORDER — CIPROFLOXACIN HCL 500 MG PO TABS: 500.0000 mg | ORAL_TABLET | Freq: Two times a day (BID) | ORAL | Status: AC

## 2013-07-27 MED ORDER — ROPINIROLE HCL 1 MG PO TABS: ORAL_TABLET | ORAL | Status: DC

## 2013-07-27 MED ORDER — ELETRIPTAN HYDROBROMIDE 20 MG PO TABS: 20.0000 mg | ORAL_TABLET | ORAL | Status: DC | PRN

## 2013-07-27 MED ORDER — HYOSCYAMINE SULFATE 0.125 MG PO TABS: 0.1250 mg | ORAL_TABLET | ORAL | Status: DC | PRN

## 2013-07-27 NOTE — Patient Instructions (Signed)
Restless Legs Syndrome Restless legs syndrome is a movement disorder. It may also be called a sensori-motor disorder.  CAUSES  No one knows what specifically causes restless legs syndrome, but it tends to run in families. It is also more common in people with low iron, in pregnancy, in people who need dialysis, and those with nerve damage (neuropathy).Some medications may make restless legs syndrome worse.Those medications include drugs to treat high blood pressure, some heart conditions, nausea, colds, allergies, and depression. SYMPTOMS Symptoms include uncomfortable sensations in the legs. These leg sensations are worse during periods of inactivity or rest. They are also worse while sitting or lying down. Individuals that have the disorder describe sensations in the legs that feel like:  Pulling.  Drawing.  Crawling.  Worming.  Boring.  Tingling.  Pins and needles.  Prickling.  Pain. The sensations are usually accompanied by an overwhelming urge to move the legs. Sudden muscle jerks may also occur. Movement provides temporary relief from the discomfort. In rare cases, the arms may also be affected. Symptoms may interfere with going to sleep (sleep onset insomnia). Restless legs syndrome may also be related to periodic limb movement disorder (PLMD). PLMD is another more common motor disorder. It also causes interrupted sleep. The symptoms from PLMD usually occur most often when you are awake. TREATMENT  Treatment for restless legs syndrome is symptomatic. This means that the symptoms are treated.   Massage and cold compresses may provide temporary relief.  Walk, stretch, or take a cold or hot bath.  Get regular exercise and a good night's sleep.  Avoid caffeine, alcohol, nicotine, and medications that can make it worse.  Do activities that provide mental stimulation like discussions, needlework, and video games. These may be helpful if you are not able to walk or  stretch. Some medications are effective in relieving the symptoms. However, many of these medications have side effects. Ask your caregiver about medications that may help your symptoms. Correcting iron deficiency may improve symptoms for some patients. Document Released: 07/25/2002 Document Revised: 10/27/2011 Document Reviewed: 10/31/2010 [facility] Patient Information 2014 [place], [place]. Colitis Colitis is inflammation of the colon. Colitis can be a short-term or long-standing (chronic) illness. Crohn's disease and ulcerative colitis are 2 types of colitis which are chronic. They usually require lifelong treatment. CAUSES  There are many different causes of colitis, including:  Viruses.  Germs (bacteria).  Medicine reactions. SYMPTOMS   Diarrhea.  Intestinal bleeding.  Pain.  Fever.  Throwing up (vomiting).  Tiredness (fatigue).  Weight loss.  Bowel blockage. DIAGNOSIS  The diagnosis of colitis is based on examination and stool or blood tests. X-rays, CT scan, and colonoscopy may also be needed. TREATMENT  Treatment may include:  Fluids given through the vein (intravenously).  Bowel rest (nothing to eat or drink for a period of time).  Medicine for pain and diarrhea.  Medicines (antibiotics) that kill germs.  Cortisone medicines.  Surgery. HOME CARE INSTRUCTIONS   Get plenty of rest.  Drink enough water and fluids to keep your urine clear or pale yellow.  Eat a well-balanced diet.  Call your caregiver for follow-up as recommended. SEEK IMMEDIATE MEDICAL CARE IF:   You develop chills.  You have an oral temperature above 102 F (38.9 C), not controlled by medicine.  You have extreme weakness, fainting, or dehydration.  You have repeated vomiting.  You develop severe belly (abdominal) pain or are passing bloody or tarry stools. MAKE SURE YOU:   Understand these instructions.  Will  watch your condition.  Will get help right away if you are not  doing well or get worse. Document Released: 09/11/2004 Document Revised: 10/27/2011 Document Reviewed: 12/07/2009 [facility] Patient Information 2014 [place], [place].

## 2013-07-29 ENCOUNTER — Telehealth: Payer: Self-pay | Admitting: Internal Medicine

## 2013-07-29 NOTE — Telephone Encounter (Signed)
Patient called and left message with the front to inform Loree Fee, PA-C that she took one dose of Ropinirole Rx and c/o negative side effects of dizziness, headache, and increased irritation.  I called patient to advise her to d/c Rx and call Monday with results on how she's feeling.  Patient states that symptoms of RLS increased with just one dose and requests we call in new Rx.  Per Loree Fee, PA-C patient needs to d/c Ropinirole and call Monday to see if Loree Fee, PA-C wants to prescribe alternative Rx for symptoms.

## 2013-07-29 NOTE — Progress Notes (Signed)
Subjective:    Patient ID: Kimberly Ray, female    DOB: December 08, 1950, 62 y.o.   MRN: [id number]  HPI Comments: 62 YO FEMALE needs FMLA forms completed for yearly coverage. She notes headaches have not changed. She occasional gets 2-3 month lasting from 1-3 days. She does note + aura, with increased sensitivity with light, sound, and smell. She uses her RX and sleep to relieve symptoms.  She has not received her OSA machine or mask yet, but notes she needs a small travel size unit due to her work constraints. She notes she also still suffering with RLS and is very difficult to sleep. She notes legs are extremely tight in the a.m.  She occasionally has diarrhea with increased stomach cramping. She denies any food triggers but thinks stress may increased episodes. She denies blood or mucus. She takes OTC and has a good BM and it usually settles down.    Current Outpatient Prescriptions on File Prior to Visit  Medication Sig Dispense Refill  . b complex vitamins tablet Take 1 tablet by mouth daily.      . cholecalciferol (VITAMIN D) 1000 UNITS tablet Take 1,000 Units by mouth daily. Take 5,000 units daily      . CINNAMON PO Take by mouth daily.      . fish oil-omega-3 fatty acids 1000 MG capsule Take 1 g by mouth daily.      Marland Kitchen lamoTRIgine (LAMICTAL) 100 MG tablet Take 100 mg by mouth 2 (two) times daily.      . magnesium 30 MG tablet Take 30 mg by mouth daily.      . metFORMIN (GLUCOPHAGE) 500 MG tablet       . Multiple Vitamin (MULTIVITAMIN) tablet Take 1 tablet by mouth daily. Multi-vitamin for Women-Take 1 daily      . NON FORMULARY Tumeric-Take 1 daily      . pantoprazole (PROTONIX) 40 MG tablet Take 40 mg by mouth daily.      Marland Kitchen selenium 50 MCG TABS Take 50 mcg by mouth daily.      Marland Kitchen VIIBRYD 40 MG TABS Take 40 mg by mouth daily.       . Vilazodone HCl 20 MG TABS Take 1 tablet (20 mg total) by mouth daily.  30 tablet  1   No current facility-administered medications on file prior to  visit.   ALLERGIES Erythromycin; Ivp dye; Penicillins; Clarithromycin; Macrodantin; and Wellbutrin  Past Medical History  Diagnosis Date  . Depression   . Migraines   . GERD (gastroesophageal reflux disease)   . Anemia   . Sinus congestion   . Memory loss   . Head trauma   . Hay fever   . OSA on CPAP   . RLS (restless legs syndrome)       Review of Systems  Gastrointestinal: Positive for diarrhea.  Musculoskeletal: Positive for myalgias.  Neurological: Positive for headaches.  Psychiatric/Behavioral: Positive for sleep disturbance.  All other systems reviewed and are negative.    BP 116/84  Pulse 82  Temp(Src) 98.2 F (36.8 C) (Temporal)  Resp 16  Ht 5\' 7"  (1.702 m)  Wt 165 lb (74.844 kg)  BMI 25.84 kg/m2     Objective:   Physical Exam  Nursing note and vitals reviewed. Constitutional: She is oriented to person, place, and time. She appears well-developed and well-nourished. No distress.  HENT:  Head: Normocephalic and atraumatic.  Right Ear: External ear normal.  Left Ear: External ear normal.  Nose: Nose  normal.  Mouth/Throat: Oropharynx is clear and moist.  Eyes: Conjunctivae and EOM are normal.  Neck: Normal range of motion. Neck supple. No JVD present. No thyromegaly present.  Cardiovascular: Normal rate, regular rhythm, normal heart sounds and intact distal pulses.   Pulmonary/Chest: Effort normal and breath sounds normal.  Abdominal: Soft. Bowel sounds are normal. She exhibits no distension and no mass. There is no tenderness. There is no rebound and no guarding.  Musculoskeletal: Normal range of motion. She exhibits no edema and no tenderness.  Lymphadenopathy:    She has no cervical adenopathy.  Neurological: She is alert and oriented to person, place, and time. No cranial nerve deficit.  Skin: Skin is warm and dry. No rash noted. No erythema. No pallor.  Psychiatric: She has a normal mood and affect. Her behavior is normal. Judgment and thought  content normal.          Assessment & Plan:  1. ? Colitis vs stress. Bland diet, Cipro 500 mg, hyoscyamine .125 AD. W/c if symptoms increase. Decrease stress. 2. Migraines- FMLA paperwork 3. OSA has not received mask or machine yet, needs smaller portable due to job travel. 4. RLS- Slow titration of requip 1mg  call with results. 5. ADD- refill RX

## 2013-08-01 ENCOUNTER — Other Ambulatory Visit: Payer: Self-pay | Admitting: Emergency Medicine

## 2013-08-03 ENCOUNTER — Other Ambulatory Visit: Payer: Self-pay | Admitting: Emergency Medicine

## 2013-08-03 MED ORDER — GABAPENTIN 300 MG PO CAPS: 300.0000 mg | ORAL_CAPSULE | Freq: Two times a day (BID) | ORAL | Status: DC

## 2013-08-08 ENCOUNTER — Telehealth: Payer: Self-pay | Admitting: Internal Medicine

## 2013-08-08 NOTE — Telephone Encounter (Signed)
Patient called with c/o negative side effects with Neurotin Rx. Rx too strong for RLS and causing her to sleep prolonged periods of time and "feels foggy."  Per Loree Fee, PA-C patient has tried and failed multiple Rx's and needs follow up with Neurologists for further evaluation and Rx to help treat symptoms.  Patient states she has follow up scheduled with [facility] in January.

## 2013-08-24 ENCOUNTER — Encounter: Payer: Self-pay | Admitting: Emergency Medicine

## 2013-08-30 ENCOUNTER — Other Ambulatory Visit (HOSPITAL_COMMUNITY)
Admission: RE | Admit: 2013-08-30 | Discharge: 2013-08-30 | Disposition: A | Discharge status: Home / Self Care | Payer: Medicare HMO | Source: Ambulatory Visit | Attending: Emergency Medicine | Admitting: Emergency Medicine

## 2013-08-30 ENCOUNTER — Ambulatory Visit (INDEPENDENT_AMBULATORY_CARE_PROVIDER_SITE_OTHER): Payer: 59 | Admitting: Emergency Medicine

## 2013-08-30 ENCOUNTER — Encounter: Payer: Self-pay | Admitting: Emergency Medicine

## 2013-08-30 VITALS — BP 124/82 | HR 70 | Temp 98.0°F | Resp 18 | Ht 67.0 in | Wt 168.0 lb

## 2013-08-30 DIAGNOSIS — E782 Mixed hyperlipidemia: Secondary | ICD-10-CM

## 2013-08-30 DIAGNOSIS — Z124 Encounter for screening for malignant neoplasm of cervix: Secondary | ICD-10-CM

## 2013-08-30 DIAGNOSIS — Z1212 Encounter for screening for malignant neoplasm of rectum: Secondary | ICD-10-CM

## 2013-08-30 DIAGNOSIS — Z0000 Encounter for general adult medical examination without abnormal findings: Secondary | ICD-10-CM

## 2013-08-30 DIAGNOSIS — Z111 Encounter for screening for respiratory tuberculosis: Secondary | ICD-10-CM

## 2013-08-30 DIAGNOSIS — F32A Depression, unspecified: Secondary | ICD-10-CM

## 2013-08-30 DIAGNOSIS — R7309 Other abnormal glucose: Secondary | ICD-10-CM

## 2013-08-30 DIAGNOSIS — Z23 Encounter for immunization: Secondary | ICD-10-CM

## 2013-08-30 DIAGNOSIS — F988 Other specified behavioral and emotional disorders with onset usually occurring in childhood and adolescence: Secondary | ICD-10-CM

## 2013-08-30 DIAGNOSIS — F329 Major depressive disorder, single episode, unspecified: Secondary | ICD-10-CM

## 2013-08-30 DIAGNOSIS — D171 Benign lipomatous neoplasm of skin and subcutaneous tissue of trunk: Secondary | ICD-10-CM

## 2013-08-30 DIAGNOSIS — R5381 Other malaise: Secondary | ICD-10-CM

## 2013-08-30 DIAGNOSIS — R5383 Other fatigue: Secondary | ICD-10-CM

## 2013-08-30 MED ORDER — METHYLPHENIDATE HCL ER (LA) 10 MG PO CP24: 10.0000 mg | ORAL_CAPSULE | Freq: Three times a day (TID) | ORAL | Status: DC

## 2013-08-30 NOTE — Patient Instructions (Signed)
Seasonal Affective Disorder  A seasonal affective disorder is a depressive reaction. It is when you feel emotionally down, which seems to come at specific times of the year. The most common time of year for this is winter. Otherwise, it behaves like a plain depression. As with other depressive disorders, there are:  Crying episodes.  Headaches.  Irritability.  Loss of energy. DIAGNOSIS  The diagnosis of this problem is usually made by the history (what has been going on). A physical exam may be done to make sure there is no other cause of your depression. TREATMENT  The treatment of seasonal affective disorders has been found to be helped immensely by photo-therapy. This means a person sits or lies for several hours per day in front of or under bright lights. The symptoms (problems) of depression respond rapidly, usually over a couple days. HOME CARE INSTRUCTIONS   Follow your caregiver's instructions for light therapy.  You must be awake during the light therapy.  If you do not respond or you feel you are getting worse, see your caregiver. Document Released: 04/29/2001 Document Revised: 10/27/2011 Document Reviewed: 11/20/2005 [facility] Patient Information 2014 [place], [place].

## 2013-08-30 NOTE — Progress Notes (Signed)
Subjective:    Patient ID: Kimberly Ray, female    DOB: April 25, 1951, 63 y.o.   MRN: [id number]  HPI Comments: 63 yo WF CPE and presents for 3 month F/U for ADD, Cholesterol, Pre-Dm, D. deficient She still notes mild depression. She notes hse feels more weepy than normal. She has not received CPAP machine yet so she is still having sleep disturbance and RLS. She was not able to tolerate RLS RX. She is not exercising routinely or eating as healthy. She had improved diet before holidays. LAST LABS BS 157 T 239 TG 274 L 144 INSULIN 251 A1 5.5 D 49  SHE NOTES THE LUMP ON HER MID BACK HAS INCREASED IN SIZE AND she would like it removed. She notes it has started affecting the way he sleeps due to it's size she is unable to lie on her back.  Anxiety Symptoms include nervous/anxious behavior. Patient reports no suicidal ideas.     Current Outpatient Prescriptions on File Prior to Visit  Medication Sig Dispense Refill  . hyoscyamine (LEVSIN, ANASPAZ) 0.125 MG tablet Take 1 tablet (0.125 mg total) by mouth every 4 (four) hours as needed.  60 tablet  0  . metFORMIN (GLUCOPHAGE) 500 MG tablet       . pantoprazole (PROTONIX) 40 MG tablet Take 40 mg by mouth daily.      Marland Kitchen VIIBRYD 40 MG TABS Take 40 mg by mouth daily.       Marland Kitchen b complex vitamins tablet Take 1 tablet by mouth daily.      . cholecalciferol (VITAMIN D) 1000 UNITS tablet Take 1,000 Units by mouth daily. Take 5,000 units daily      . CINNAMON PO Take by mouth daily.      . fish oil-omega-3 fatty acids 1000 MG capsule Take 1 g by mouth daily.      Marland Kitchen gabapentin (NEURONTIN) 300 MG capsule Take 1 capsule (300 mg total) by mouth 2 (two) times daily.  60 capsule  2  . lamoTRIgine (LAMICTAL) 100 MG tablet Take 100 mg by mouth 2 (two) times daily.      . magnesium 30 MG tablet Take 30 mg by mouth daily.      . Multiple Vitamin (MULTIVITAMIN) tablet Take 1 tablet by mouth daily. Multi-vitamin for Women-Take 1 daily      . NON FORMULARY Tumeric-Take  1 daily      . rOPINIRole (REQUIP) 1 MG tablet Start 1 qhs and slowly  Titrate up to 3 a day  90 tablet  1  . selenium 50 MCG TABS Take 50 mcg by mouth daily.       No current facility-administered medications on file prior to visit.   ALLERGIES Erythromycin; Ivp dye; Penicillins; Clarithromycin; Macrodantin; and Wellbutrin  Past Medical History  Diagnosis Date  . Depression   . Migraines   . GERD (gastroesophageal reflux disease)   . Anemia   . Sinus congestion   . Memory loss   . Head trauma   . Hay fever   . OSA on CPAP   . RLS (restless legs syndrome)    Past Surgical History  Procedure Laterality Date  . Perforated eardrum      left side  . Nasal sinus surgery    . Tonsillectomy and adenoidectomy    . Kidney stone removed     History  Substance Use Topics  . Smoking status: Never Smoker   . Smokeless tobacco: Never Used  . Alcohol Use: 0.6  oz/week    1 Shots of liquor per week     Comment: Once a year   Family History  Problem Relation Age of Onset  . Diabetes Mother   . Diabetes Sister   . Breast cancer Maternal Grandmother       Review of Systems  Constitutional: Positive for fatigue.  Eyes:       MILLER 2014 WNL  Psychiatric/Behavioral: Positive for sleep disturbance. Negative for suicidal ideas. The patient is nervous/anxious.   All other systems reviewed and are negative.   BP 124/82  Pulse 70  Temp(Src) 98 F (36.7 C) (Temporal)  Resp 18  Ht 5\' 7"  (1.702 m)  Wt 168 lb (76.204 kg)  BMI 26.31 kg/m2     Objective:   Physical Exam  Nursing note and vitals reviewed. Constitutional: She is oriented to person, place, and time. She appears well-developed and well-nourished. No distress.  HENT:  Head: Normocephalic and atraumatic.  Right Ear: External ear normal.  Left Ear: External ear normal.  Nose: Nose normal.  Mouth/Throat: Oropharynx is clear and moist. No oropharyngeal exudate.  Eyes: Conjunctivae and EOM are normal. Pupils are  equal, round, and reactive to light. Right eye exhibits no discharge. Left eye exhibits no discharge. No scleral icterus.  Neck: Normal range of motion. Neck supple. No JVD present. No tracheal deviation present. No thyromegaly present.  Cardiovascular: Normal rate, regular rhythm, normal heart sounds and intact distal pulses.   Pulmonary/Chest: Effort normal and breath sounds normal.  Abdominal: Soft. Bowel sounds are normal. She exhibits no distension and no mass. There is no tenderness. There is no rebound and no guarding.  Genitourinary: Vagina normal and uterus normal. No vaginal discharge found.  Breasts WNL  Musculoskeletal: Normal range of motion. She exhibits no edema and no tenderness.       Arms: Lymphadenopathy:    She has no cervical adenopathy.  Neurological: She is alert and oriented to person, place, and time. She has normal reflexes. No cranial nerve deficit. She exhibits normal muscle tone. Coordination normal.  Skin: Skin is warm and dry. No rash noted. No erythema. No pallor.  Psychiatric: She has a normal mood and affect. Her behavior is normal. Judgment and thought content normal.      EKG NSCSPT    Assessment & Plan:  1. CPE AND 3 month F/U for ADD, Cholesterol, Pre-Dm, D. Deficient. Needs healthy diet, cardio QD and obtain healthy weight. Check Labs, Check BP if >130/80 call office 2. Depression/ Fatigue vs OSA without machine- check labs, increase activity and H2O. Increase folic acid, STart CPAP, Increase lamictal to 1 tab a.m. And 1/2 tab p.m. If no change consider change RX 3. Back ?  Lipoma- Ref Gen Surgery

## 2013-08-31 ENCOUNTER — Other Ambulatory Visit: Payer: Managed Care, Other (non HMO)

## 2013-08-31 ENCOUNTER — Ambulatory Visit (HOSPITAL_COMMUNITY)
Admission: RE | Admit: 2013-08-31 | Discharge: 2013-08-31 | Disposition: A | Discharge status: Home / Self Care | Payer: Medicare HMO | Source: Ambulatory Visit | Attending: Internal Medicine | Admitting: Internal Medicine

## 2013-08-31 DIAGNOSIS — R5383 Other fatigue: Principal | ICD-10-CM

## 2013-08-31 DIAGNOSIS — E559 Vitamin D deficiency, unspecified: Secondary | ICD-10-CM

## 2013-08-31 DIAGNOSIS — R7309 Other abnormal glucose: Secondary | ICD-10-CM

## 2013-08-31 DIAGNOSIS — Z1231 Encounter for screening mammogram for malignant neoplasm of breast: Secondary | ICD-10-CM

## 2013-08-31 DIAGNOSIS — R5381 Other malaise: Secondary | ICD-10-CM

## 2013-08-31 DIAGNOSIS — E782 Mixed hyperlipidemia: Secondary | ICD-10-CM

## 2013-08-31 LAB — URINALYSIS, ROUTINE W REFLEX MICROSCOPIC
Bilirubin Urine: NEGATIVE
Glucose, UA: NEGATIVE mg/dL
Hgb urine dipstick: NEGATIVE
Ketones, ur: NEGATIVE mg/dL
LEUKOCYTES UA: NEGATIVE
NITRITE: NEGATIVE
PH: 5.5 (ref 5.0–8.0)
Protein, ur: NEGATIVE mg/dL
Specific Gravity, Urine: 1.012 (ref 1.005–1.030)
Urobilinogen, UA: 0.2 mg/dL (ref 0.0–1.0)

## 2013-08-31 LAB — CBC WITH DIFFERENTIAL/PLATELET
BASOS ABS: 0 10*3/uL (ref 0.0–0.1)
BASOS PCT: 1 % (ref 0–1)
EOS ABS: 0 10*3/uL (ref 0.0–0.7)
Eosinophils Relative: 1 % (ref 0–5)
HCT: 41 % (ref 36.0–46.0)
HEMOGLOBIN: 13.8 g/dL (ref 12.0–15.0)
Lymphocytes Relative: 10 % — ABNORMAL LOW (ref 12–46)
Lymphs Abs: 0.7 10*3/uL (ref 0.7–4.0)
MCH: 27.9 pg (ref 26.0–34.0)
MCHC: 33.7 g/dL (ref 30.0–36.0)
MCV: 83 fL (ref 78.0–100.0)
Monocytes Absolute: 0.5 10*3/uL (ref 0.1–1.0)
Monocytes Relative: 8 % (ref 3–12)
NEUTROS PCT: 80 % — AB (ref 43–77)
Neutro Abs: 5.3 10*3/uL (ref 1.7–7.7)
Platelets: 261 10*3/uL (ref 150–400)
RBC: 4.94 MIL/uL (ref 3.87–5.11)
RDW: 13.8 % (ref 11.5–15.5)
WBC: 6.5 10*3/uL (ref 4.0–10.5)

## 2013-08-31 LAB — HEPATIC FUNCTION PANEL
ALK PHOS: 85 U/L (ref 39–117)
ALT: 16 U/L (ref 0–35)
AST: 20 U/L (ref 0–37)
Albumin: 4.6 g/dL (ref 3.5–5.2)
BILIRUBIN DIRECT: 0.1 mg/dL (ref 0.0–0.3)
Indirect Bilirubin: 0.5 mg/dL (ref 0.0–0.9)
Total Bilirubin: 0.6 mg/dL (ref 0.3–1.2)
Total Protein: 6.9 g/dL (ref 6.0–8.3)

## 2013-08-31 LAB — MICROALBUMIN / CREATININE URINE RATIO
CREATININE, URINE: 60.8 mg/dL
MICROALB/CREAT RATIO: 9.9 mg/g (ref 0.0–30.0)
Microalb, Ur: 0.6 mg/dL (ref 0.00–1.89)

## 2013-08-31 LAB — HEMOGLOBIN A1C
HEMOGLOBIN A1C: 5.9 % — AB (ref <5.7)
Mean Plasma Glucose: 123 mg/dL — ABNORMAL HIGH (ref <117)

## 2013-08-31 LAB — TSH: TSH: 1.75 u[IU]/mL (ref 0.350–4.500)

## 2013-08-31 LAB — BASIC METABOLIC PANEL WITH GFR
BUN: 21 mg/dL (ref 6–23)
CO2: 27 mEq/L (ref 19–32)
Calcium: 9.4 mg/dL (ref 8.4–10.5)
Chloride: 102 mEq/L (ref 96–112)
Creat: 0.68 mg/dL (ref 0.50–1.10)
GFR, Est African American: >89 mL/min
GFR, Est Non African American: >89 mL/min
Glucose, Bld: 95 mg/dL (ref 70–99)
Potassium: 4 mEq/L (ref 3.5–5.3)
Sodium: 140 mEq/L (ref 135–145)

## 2013-08-31 LAB — MAGNESIUM: Magnesium: 1.7 mg/dL (ref 1.5–2.5)

## 2013-08-31 LAB — LIPID PANEL
CHOL/HDL RATIO: 4 ratio
CHOLESTEROL: 225 mg/dL — AB (ref 0–200)
HDL: 56 mg/dL (ref >39)
LDL Cholesterol: 121 mg/dL — ABNORMAL HIGH (ref 0–99)
Triglycerides: 242 mg/dL — ABNORMAL HIGH (ref <150)
VLDL: 48 mg/dL — ABNORMAL HIGH (ref 0–40)

## 2013-09-01 ENCOUNTER — Other Ambulatory Visit: Payer: Self-pay | Admitting: Internal Medicine

## 2013-09-01 ENCOUNTER — Other Ambulatory Visit: Payer: Self-pay | Admitting: Emergency Medicine

## 2013-09-01 DIAGNOSIS — R928 Other abnormal and inconclusive findings on diagnostic imaging of breast: Secondary | ICD-10-CM

## 2013-09-01 LAB — INSULIN, FASTING: Insulin fasting, serum: 49 u[IU]/mL — ABNORMAL HIGH (ref 3–28)

## 2013-09-01 LAB — VITAMIN D 25 HYDROXY (VIT D DEFICIENCY, FRACTURES): Vit D, 25-Hydroxy: 42 ng/mL (ref 30–89)

## 2013-09-01 MED ORDER — PRAVASTATIN SODIUM 40 MG PO TABS: 40.0000 mg | ORAL_TABLET | Freq: Every evening | ORAL | Status: DC

## 2013-09-02 LAB — TB SKIN TEST
Induration: 0 mm
TB Skin Test: NEGATIVE

## 2013-09-07 NOTE — Progress Notes (Signed)
SCHEDULED PT 6 WK F/U WITH ms 10/14/13

## 2013-09-12 ENCOUNTER — Ambulatory Visit: Payer: 59 | Admitting: Nurse Practitioner

## 2013-09-13 ENCOUNTER — Ambulatory Visit
Admission: RE | Admit: 2013-09-13 | Discharge: 2013-09-13 | Disposition: A | Discharge status: Home / Self Care | Payer: Managed Care, Other (non HMO) | Source: Ambulatory Visit | Attending: Internal Medicine | Admitting: Internal Medicine

## 2013-09-13 DIAGNOSIS — R928 Other abnormal and inconclusive findings on diagnostic imaging of breast: Secondary | ICD-10-CM

## 2013-09-21 ENCOUNTER — Ambulatory Visit (INDEPENDENT_AMBULATORY_CARE_PROVIDER_SITE_OTHER): Payer: Medicare HMO | Admitting: Surgery

## 2013-10-11 ENCOUNTER — Ambulatory Visit: Payer: Self-pay | Admitting: Emergency Medicine

## 2013-10-12 ENCOUNTER — Ambulatory Visit (INDEPENDENT_AMBULATORY_CARE_PROVIDER_SITE_OTHER): Payer: Medicare HMO | Admitting: Surgery

## 2013-10-30 ENCOUNTER — Other Ambulatory Visit: Payer: Self-pay | Admitting: Emergency Medicine

## 2013-10-30 MED ORDER — PANTOPRAZOLE SODIUM 40 MG PO TBEC: 40.0000 mg | DELAYED_RELEASE_TABLET | Freq: Every day | ORAL | Status: DC

## 2013-12-02 ENCOUNTER — Ambulatory Visit: Payer: 59 | Admitting: Nurse Practitioner

## 2013-12-05 ENCOUNTER — Encounter: Payer: Self-pay | Admitting: Emergency Medicine

## 2013-12-05 ENCOUNTER — Ambulatory Visit (INDEPENDENT_AMBULATORY_CARE_PROVIDER_SITE_OTHER): Payer: Managed Care, Other (non HMO) | Admitting: Emergency Medicine

## 2013-12-05 VITALS — BP 122/74 | HR 92 | Temp 98.0°F | Resp 16 | Ht 67.0 in | Wt 179.0 lb

## 2013-12-05 DIAGNOSIS — R5383 Other fatigue: Secondary | ICD-10-CM

## 2013-12-05 DIAGNOSIS — R7309 Other abnormal glucose: Secondary | ICD-10-CM

## 2013-12-05 DIAGNOSIS — K5732 Diverticulitis of large intestine without perforation or abscess without bleeding: Secondary | ICD-10-CM

## 2013-12-05 DIAGNOSIS — F988 Other specified behavioral and emotional disorders with onset usually occurring in childhood and adolescence: Secondary | ICD-10-CM

## 2013-12-05 DIAGNOSIS — R5381 Other malaise: Secondary | ICD-10-CM

## 2013-12-05 DIAGNOSIS — E782 Mixed hyperlipidemia: Secondary | ICD-10-CM

## 2013-12-05 LAB — CBC WITH DIFFERENTIAL/PLATELET
BASOS ABS: 0 10*3/uL (ref 0.0–0.1)
BASOS PCT: 0 % (ref 0–1)
Eosinophils Absolute: 0 10*3/uL (ref 0.0–0.7)
Eosinophils Relative: 0 % (ref 0–5)
HCT: 39.6 % (ref 36.0–46.0)
HEMOGLOBIN: 13.1 g/dL (ref 12.0–15.0)
Lymphocytes Relative: 18 % (ref 12–46)
Lymphs Abs: 1.8 10*3/uL (ref 0.7–4.0)
MCH: 27.7 pg (ref 26.0–34.0)
MCHC: 33.1 g/dL (ref 30.0–36.0)
MCV: 83.7 fL (ref 78.0–100.0)
MONOS PCT: 9 % (ref 3–12)
Monocytes Absolute: 0.9 10*3/uL (ref 0.1–1.0)
NEUTROS PCT: 73 % (ref 43–77)
Neutro Abs: 7.2 10*3/uL (ref 1.7–7.7)
Platelets: 302 10*3/uL (ref 150–400)
RBC: 4.73 MIL/uL (ref 3.87–5.11)
RDW: 13.8 % (ref 11.5–15.5)
WBC: 9.9 10*3/uL (ref 4.0–10.5)

## 2013-12-05 MED ORDER — CIPROFLOXACIN HCL 500 MG PO TABS: 500.0000 mg | ORAL_TABLET | Freq: Two times a day (BID) | ORAL | Status: AC

## 2013-12-05 MED ORDER — ELETRIPTAN HYDROBROMIDE 20 MG PO TABS: 20.0000 mg | ORAL_TABLET | ORAL | Status: DC | PRN

## 2013-12-05 MED ORDER — LAMOTRIGINE 100 MG PO TABS: 100.0000 mg | ORAL_TABLET | Freq: Two times a day (BID) | ORAL | Status: DC

## 2013-12-05 MED ORDER — PANTOPRAZOLE SODIUM 40 MG PO TBEC: 40.0000 mg | DELAYED_RELEASE_TABLET | Freq: Every day | ORAL | Status: DC

## 2013-12-05 MED ORDER — VILAZODONE HCL 40 MG PO TABS: 40.0000 mg | ORAL_TABLET | Freq: Every day | ORAL | Status: DC

## 2013-12-05 MED ORDER — METHYLPHENIDATE HCL ER (LA) 10 MG PO CP24: 10.0000 mg | ORAL_CAPSULE | Freq: Three times a day (TID) | ORAL | Status: DC

## 2013-12-05 NOTE — Progress Notes (Signed)
Subjective:    Patient ID: Kimberly Ray, female    DOB: 1951-02-02, 63 y.o.   MRN: [id number]  HPI Comments: 63 yo WF presents for 3 month F/U for HTN, Cholesterol, Pre-Dm, D. Deficient. She had been eating healthy and keeping active before recent illness.  Last labs  HGBA1C      5.9   08/31/2013 CREATININE     0.68   08/31/2013 BUN              21   08/31/2013 NA              140   08/31/2013 K               4.0   08/31/2013 CL              102   08/31/2013 CO2              27   08/31/2013 ALT           16   08/31/2013 AST           20   08/31/2013 ALKPHOS       85   08/31/2013 BILITOT      0.6   08/31/2013 CHOL         225   08/31/2013 HDL           56   08/31/2013 LDLCALC      121   08/31/2013 TRIG         242   08/31/2013 CHOLHDL      4.0   08/31/2013 WBC      6.5   08/31/2013 HGB     13.8   08/31/2013 HCT     41.0   08/31/2013 MCV     83.0   08/31/2013 PLT      261   08/31/2013  She notes cold like symptoms x 2 days and now with increased aches/ fatigue/ low abdomen pain/ feverish. She has tried Herbals and increased fluids w/o relief. She denies OTC cold medication. She notes she did eat spicy soup last eve that could contribute to abdomen pain today.  She notes migraines increased last month with increased allergy congestion. She missed at least 4-6 days of work with increased symptoms of headaches. She notes mild improvement with frequency this month. She notes relpax helps but does not always completely resolve headache.   Anxiety    Hyperlipidemia Associated symptoms include myalgias.   Current Outpatient Prescriptions on File Prior to Visit  Medication Sig Dispense Refill  . cholecalciferol (VITAMIN D) 1000 UNITS tablet Take 1,000 Units by mouth daily. Take 5,000 units daily      . fish oil-omega-3 fatty acids 1000 MG capsule Take 1 g by mouth daily.      Marland Kitchen lamoTRIgine (LAMICTAL) 100 MG tablet Take 100 mg by mouth 2 (two) times daily.      . magnesium 30 MG tablet Take 30 mg by  mouth daily.      . metFORMIN (GLUCOPHAGE) 500 MG tablet Take 500 mg by mouth daily with breakfast.       . methylphenidate (RITALIN LA) 10 MG 24 hr capsule Take 1 capsule (10 mg total) by mouth 3 (three) times daily.  90 capsule  0  . Multiple Vitamin (MULTIVITAMIN) tablet Take 1 tablet by mouth daily. Multi-vitamin for Women-Take 1 daily      . pantoprazole (PROTONIX) 40 MG tablet Take 1 tablet (40 mg total)  by mouth daily.  90 tablet  1  . selenium 50 MCG TABS Take 50 mcg by mouth daily.      Marland Kitchen VIIBRYD 40 MG TABS Take 40 mg by mouth daily.        No current facility-administered medications on file prior to visit.   Allergies  Allergen Reactions  . Erythromycin Other (See Comments)    TBS  . Ivp Dye [Iodinated Diagnostic Agents] Other (See Comments)    Hot flashes, itching scalp  . Penicillins Hives  . Clarithromycin Other (See Comments)    Itching ankles  . Macrodantin [Nitrofurantoin]   . Wellbutrin [Bupropion]    Past Medical History  Diagnosis Date  . Depression   . Migraines   . GERD (gastroesophageal reflux disease)   . Anemia   . Sinus congestion   . Memory loss   . Head trauma   . Hay fever   . OSA on CPAP   . RLS (restless legs syndrome)       Review of Systems  Constitutional: Positive for fever and fatigue.  HENT: Positive for postnasal drip.   Gastrointestinal: Positive for abdominal pain.  Musculoskeletal: Positive for arthralgias and myalgias.  Neurological: Positive for headaches.   BP 122/74  Pulse 92  Temp(Src) 98 F (36.7 C) (Temporal)  Resp 16  Ht 5\' 7"  (1.702 m)  Wt 179 lb (81.194 kg)  BMI 28.03 kg/m2     Objective:   Physical Exam  Nursing note and vitals reviewed. Constitutional: She is oriented to person, place, and time. She appears well-developed and well-nourished. No distress.  HENT:  Head: Normocephalic and atraumatic.  Right Ear: External ear normal.  Left Ear: External ear normal.  Nose: Nose normal.  Mouth/Throat:  Oropharynx is clear and moist. No oropharyngeal exudate.  Cloudy TM's bilaterally   Eyes: Conjunctivae and EOM are normal.  Neck: Normal range of motion. Neck supple. No JVD present. No thyromegaly present.  Cardiovascular: Normal rate, regular rhythm, normal heart sounds and intact distal pulses.   Pulmonary/Chest: Effort normal and breath sounds normal.  Abdominal: Soft. Bowel sounds are normal. She exhibits no distension and no mass. There is tenderness. There is no rebound and no guarding.  LLQ  Musculoskeletal: Normal range of motion. She exhibits no edema and no tenderness.  Lymphadenopathy:    She has no cervical adenopathy.  Neurological: She is alert and oriented to person, place, and time. No cranial nerve deficit.  Skin: Skin is warm and dry. No rash noted. No erythema. No pallor.  Psychiatric: She has a normal mood and affect. Her behavior is normal. Judgment and thought content normal.          Assessment & Plan:  1.  3 month F/U for Cholesterol, Pre-Dm, D. Deficient. Needs healthy diet, cardio QD and obtain healthy weight. Check Labs, Check BP if >130/80 call office   2. ? Viral illness vs colitis- Cipro 500 mg AD, bland diet explained, push fluids, check labs, w/c if SX increase or ER.   3. Migraines- Refil RX, continue AD may need to change allergy medication during flares to see if decreases migraines

## 2013-12-05 NOTE — Patient Instructions (Signed)
Dehydration, Adult Dehydration means your body does not have as much fluid as it needs. Your kidneys, brain, and heart will not work properly without the right amount of fluids and salt.  HOME CARE  Ask your doctor how to replace body fluid losses (rehydrate).  Drink enough fluids to keep your pee (urine) clear or pale yellow.  Drink small amounts of fluids often if you feel sick to your stomach (nauseous) or throw up (vomit).  Eat like you normally do.  Avoid:  Foods or drinks high in sugar.  Bubbly (carbonated) drinks.  Juice.  Very hot or cold fluids.  Drinks with caffeine.  Fatty, greasy foods.  Alcohol.  Tobacco.  Eating too much.  Gelatin desserts.  Wash your hands to avoid spreading germs (bacteria, viruses).  Only take medicine as told by your doctor.  Keep all doctor visits as told. GET HELP RIGHT AWAY IF:   You cannot drink something without throwing up.  You get worse even with treatment.  Your vomit has blood in it or looks greenish.  Your poop (stool) has blood in it or looks black and tarry.  You have not peed in 6 to 8 hours.  You pee a small amount of very dark pee.  You have a fever.  You pass out (faint).  You have belly (abdominal) pain that gets worse or stays in one spot (localizes).  You have a rash, stiff neck, or bad headache.  You get easily annoyed, sleepy, or are hard to wake up.  You feel weak, dizzy, or very thirsty. MAKE SURE YOU:   Understand these instructions.  Will watch your condition.  Will get help right away if you are not doing well or get worse. Document Released: 05/31/2009 Document Revised: 10/27/2011 Document Reviewed: 03/24/2011 [facility] Patient Information 2014 [place], [place]. Diverticulitis Small pockets or "bubbles" can develop in the wall of the intestine. Diverticulitis is when those pockets become infected and inflamed. This causes stomach pain (usually on the left side). HOME CARE  Take  all medicine as told by your doctor.  Try a clear liquid diet (broth, tea, or water) for as long as told by your doctor.  Keep all follow-up visits with your doctor.  You may be put on a low-fiber diet once you start feeling better. Here are foods that have low-fiber:  White breads, cereals, rice, and pasta.  Cooked fruits and vegetables or soft fresh fruits and vegetables without the skin.  Ground or well-cooked tender beef, ham, veal, lamb, pork, or poultry.  Eggs and seafood.  After you are doing well on the low-fiber diet, you may be put on a high-fiber diet. Here are ways to increase your fiber:  Choose whole-grain breads, cereals, pasta, and brown rice.  Choose fruits and vegetables with skin on. Do not overcook the vegetables.  Choose nuts, seeds, legumes, dried peas, beans, and lentils.  Look for food products that have more than 3 grams of fiber per serving on the food label. GET HELP RIGHT AWAY IF:  Your pain does not get better or gets worse.  You have trouble eating food.  You are not pooping (having bowel movements) like normal.  You have a temperature by mouth above 102 F (38.9 C), not controlled by medicine.  You keep throwing up (vomiting).  You have bloody or black, tarry poop (stools).  You are getting worse and not better. MAKE SURE YOU:   Understand these instructions.  Will watch your condition.  Will get  help right away if you are not doing well or get worse. Document Released: 01/21/2008 Document Revised: 10/27/2011 Document Reviewed: 06/25/2009 [facility] Patient Information 2014 [place], [place].

## 2013-12-06 LAB — HEPATIC FUNCTION PANEL
ALT: 30 U/L (ref 0–35)
AST: 29 U/L (ref 0–37)
Albumin: 4.4 g/dL (ref 3.5–5.2)
Alkaline Phosphatase: 71 U/L (ref 39–117)
BILIRUBIN DIRECT: 0.1 mg/dL (ref 0.0–0.3)
BILIRUBIN INDIRECT: 0.4 mg/dL (ref 0.2–1.2)
TOTAL PROTEIN: 6.5 g/dL (ref 6.0–8.3)
Total Bilirubin: 0.5 mg/dL (ref 0.2–1.2)

## 2013-12-06 LAB — HEMOGLOBIN A1C
Hgb A1c MFr Bld: 5.8 % — ABNORMAL HIGH (ref <5.7)
Mean Plasma Glucose: 120 mg/dL — ABNORMAL HIGH (ref <117)

## 2013-12-06 LAB — BASIC METABOLIC PANEL WITH GFR
BUN: 12 mg/dL (ref 6–23)
CO2: 24 meq/L (ref 19–32)
CREATININE: 0.71 mg/dL (ref 0.50–1.10)
Calcium: 9.3 mg/dL (ref 8.4–10.5)
Chloride: 102 mEq/L (ref 96–112)
GFR, Est African American: >89 mL/min
GFR, Est Non African American: >89 mL/min
Glucose, Bld: 115 mg/dL — ABNORMAL HIGH (ref 70–99)
Potassium: 4.3 mEq/L (ref 3.5–5.3)
Sodium: 138 mEq/L (ref 135–145)

## 2013-12-06 LAB — LIPID PANEL
Cholesterol: 193 mg/dL (ref 0–200)
HDL: 47 mg/dL (ref >39)
LDL CALC: 107 mg/dL — AB (ref 0–99)
TRIGLYCERIDES: 193 mg/dL — AB (ref <150)
Total CHOL/HDL Ratio: 4.1 Ratio
VLDL: 39 mg/dL (ref 0–40)

## 2013-12-06 LAB — INSULIN, FASTING: Insulin fasting, serum: 170 u[IU]/mL — ABNORMAL HIGH (ref 3–28)

## 2013-12-14 ENCOUNTER — Encounter: Payer: Self-pay | Admitting: *Deleted

## 2013-12-15 ENCOUNTER — Other Ambulatory Visit: Payer: Self-pay | Admitting: Emergency Medicine

## 2013-12-15 MED ORDER — CIPROFLOXACIN HCL 500 MG PO TABS: 500.0000 mg | ORAL_TABLET | Freq: Two times a day (BID) | ORAL | Status: AC

## 2014-01-02 ENCOUNTER — Ambulatory Visit: Payer: 59 | Admitting: Neurology

## 2014-01-19 ENCOUNTER — Ambulatory Visit (INDEPENDENT_AMBULATORY_CARE_PROVIDER_SITE_OTHER): Payer: Managed Care, Other (non HMO) | Admitting: Emergency Medicine

## 2014-01-19 ENCOUNTER — Encounter: Payer: Self-pay | Admitting: Emergency Medicine

## 2014-01-19 VITALS — BP 122/70 | HR 102 | Temp 97.8°F | Resp 16 | Ht 67.0 in | Wt 183.0 lb

## 2014-01-19 DIAGNOSIS — R109 Unspecified abdominal pain: Secondary | ICD-10-CM

## 2014-01-19 DIAGNOSIS — H9209 Otalgia, unspecified ear: Secondary | ICD-10-CM

## 2014-01-19 DIAGNOSIS — R062 Wheezing: Secondary | ICD-10-CM

## 2014-01-19 DIAGNOSIS — F988 Other specified behavioral and emotional disorders with onset usually occurring in childhood and adolescence: Secondary | ICD-10-CM

## 2014-01-19 MED ORDER — METHYLPHENIDATE HCL ER (CD) 10 MG PO CPCR: 10.0000 mg | ORAL_CAPSULE | Freq: Three times a day (TID) | ORAL | Status: DC

## 2014-01-19 MED ORDER — ALBUTEROL SULFATE HFA 108 (90 BASE) MCG/ACT IN AERS: 2.0000 | INHALATION_SPRAY | Freq: Four times a day (QID) | RESPIRATORY_TRACT | Status: DC | PRN

## 2014-01-19 MED ORDER — DOXYCYCLINE HYCLATE 100 MG PO TABS: 100.0000 mg | ORAL_TABLET | Freq: Two times a day (BID) | ORAL | Status: DC

## 2014-01-19 MED ORDER — PREDNISONE 10 MG PO TABS: ORAL_TABLET | ORAL | Status: DC

## 2014-01-19 MED ORDER — ELETRIPTAN HYDROBROMIDE 20 MG PO TABS: 20.0000 mg | ORAL_TABLET | ORAL | Status: DC | PRN

## 2014-01-19 NOTE — Patient Instructions (Signed)
Allergic Rhinitis Allergic rhinitis is when the mucous membranes in the nose respond to allergens. Allergens are particles in the air that cause your body to have an allergic reaction. This causes you to release allergic antibodies. Through a chain of events, these eventually cause you to release histamine into the blood stream. Although meant to protect the body, it is this release of histamine that causes your discomfort, such as frequent sneezing, congestion, and an itchy, runny nose.  CAUSES  Seasonal allergic rhinitis (hay fever) is caused by pollen allergens that may come from grasses, trees, and weeds. Year-round allergic rhinitis (perennial allergic rhinitis) is caused by allergens such as house dust mites, pet dander, and mold spores.  SYMPTOMS   Nasal stuffiness (congestion).  Itchy, runny nose with sneezing and tearing of the eyes. DIAGNOSIS  Your health care provider can help you determine the allergen or allergens that trigger your symptoms. If you and your health care provider are unable to determine the allergen, skin or blood testing may be used. TREATMENT  Allergic Rhinitis does not have a cure, but it can be controlled by:  Medicines and allergy shots (immunotherapy).  Avoiding the allergen. Hay fever may often be treated with antihistamines in pill or nasal spray forms. Antihistamines block the effects of histamine. There are over-the-counter medicines that may help with nasal congestion and swelling around the eyes. Check with your health care provider before taking or giving this medicine.  If avoiding the allergen or the medicine prescribed do not work, there are many new medicines your health care provider can prescribe. Stronger medicine may be used if initial measures are ineffective. Desensitizing injections can be used if medicine and avoidance does not work. Desensitization is when a patient is given ongoing shots until the body becomes less sensitive to the allergen.  Make sure you follow up with your health care provider if problems continue. HOME CARE INSTRUCTIONS It is not possible to completely avoid allergens, but you can reduce your symptoms by taking steps to limit your exposure to them. It helps to know exactly what you are allergic to so that you can avoid your specific triggers. SEEK MEDICAL CARE IF:   You have a fever.  You develop a cough that does not stop easily (persistent).  You have shortness of breath.  You start wheezing.  Symptoms interfere with normal daily activities. Document Released: 04/29/2001 Document Revised: 05/25/2013 Document Reviewed: 04/11/2013 [employer] Patient Information 2014 [employer].  

## 2014-01-19 NOTE — Progress Notes (Signed)
   Subjective:    Patient ID: Kimberly Ray, female    DOB: [birth year], 63 y.o.   MRN: [id number]  HPI Comments: 63 yo WF has had cold over 2 weeks. She now has laryngitis/ sinus pressure with color to production. She started zpak and it improved some of symptoms. She still has wheeze and feels clamy. She notes her ears have felt full on/ off.  SHe is still having abdomen pain on/off and has not followed up at GI AD. She has not had severe diarrhea as previous.   Otalgia  Associated symptoms include coughing and headaches.  Cough Associated symptoms include ear pain, headaches and wheezing.  Headache  Associated symptoms include coughing and ear pain.      Review of Systems  Constitutional: Positive for fatigue.  HENT: Positive for congestion and ear pain.   Respiratory: Positive for cough and wheezing.   Neurological: Positive for headaches.  All other systems reviewed and are negative.  BP 122/70  Pulse 102  Temp(Src) 97.8 F (36.6 C) (Temporal)  Resp 16  Ht 5\' 7"  (1.702 m)  Wt 183 lb (83.008 kg)  BMI 28.66 kg/m2     Objective:   Physical Exam  Nursing note and vitals reviewed. Constitutional: She is oriented to person, place, and time. She appears well-developed and well-nourished. No distress.  HENT:  Head: Normocephalic and atraumatic.  Right Ear: External ear normal.  Left Ear: External ear normal.  Nose: Nose normal.  Mouth/Throat: Oropharynx is clear and moist. No oropharyngeal exudate.  Left TM with ? Perforation, right with fluid bubbles  Eyes: Conjunctivae and EOM are normal.  Neck: Normal range of motion. Neck supple. No JVD present. No thyromegaly present.  Cardiovascular: Normal rate, regular rhythm, normal heart sounds and intact distal pulses.   Pulmonary/Chest: Effort normal. She has wheezes.  Abdominal: Soft. Bowel sounds are normal. She exhibits no distension. There is no tenderness. There is no rebound.  Musculoskeletal: Normal range of motion.  She exhibits no edema and no tenderness.  Lymphadenopathy:    She has no cervical adenopathy.  Neurological: She is alert and oriented to person, place, and time. No cranial nerve deficit.  Skin: Skin is warm and dry. No rash noted. No erythema. No pallor.  Psychiatric: She has a normal mood and affect. Her behavior is normal. Judgment and thought content normal.          Assessment & Plan:  1. Abdomen pain- Needs GI referral, prefers not to go to [facility]  2. Ear pain- REFER ENT w/ ? perforation  3. Wheeze- Pred DP 10 mg AD, Doxy 100 mg AD, Alb HFA inhaler AD

## 2014-02-21 ENCOUNTER — Other Ambulatory Visit: Payer: Self-pay | Admitting: Internal Medicine

## 2014-02-21 DIAGNOSIS — R921 Mammographic calcification found on diagnostic imaging of breast: Secondary | ICD-10-CM

## 2014-02-27 ENCOUNTER — Ambulatory Visit
Admission: RE | Admit: 2014-02-27 | Discharge: 2014-02-27 | Disposition: A | Discharge status: Home / Self Care | Payer: Managed Care, Other (non HMO) | Source: Ambulatory Visit | Attending: Internal Medicine | Admitting: Internal Medicine

## 2014-02-27 ENCOUNTER — Other Ambulatory Visit: Payer: Self-pay | Admitting: Internal Medicine

## 2014-02-27 ENCOUNTER — Ambulatory Visit: Payer: Self-pay | Admitting: Emergency Medicine

## 2014-02-27 DIAGNOSIS — R921 Mammographic calcification found on diagnostic imaging of breast: Secondary | ICD-10-CM

## 2014-02-27 HISTORY — PX: BREAST BIOPSY: SHX20

## 2014-02-28 ENCOUNTER — Ambulatory Visit
Admission: RE | Admit: 2014-02-28 | Discharge: 2014-02-28 | Disposition: A | Discharge status: Home / Self Care | Payer: Managed Care, Other (non HMO) | Source: Ambulatory Visit | Attending: Emergency Medicine | Admitting: Emergency Medicine

## 2014-02-28 ENCOUNTER — Other Ambulatory Visit: Payer: Self-pay | Admitting: Otolaryngology

## 2014-02-28 ENCOUNTER — Ambulatory Visit (INDEPENDENT_AMBULATORY_CARE_PROVIDER_SITE_OTHER): Payer: Managed Care, Other (non HMO) | Admitting: Emergency Medicine

## 2014-02-28 ENCOUNTER — Ambulatory Visit
Admission: RE | Admit: 2014-02-28 | Discharge: 2014-02-28 | Disposition: A | Discharge status: Home / Self Care | Payer: Managed Care, Other (non HMO) | Source: Ambulatory Visit | Attending: Otolaryngology | Admitting: Otolaryngology

## 2014-02-28 ENCOUNTER — Ambulatory Visit: Payer: Self-pay | Admitting: Emergency Medicine

## 2014-02-28 ENCOUNTER — Encounter: Payer: Self-pay | Admitting: Emergency Medicine

## 2014-02-28 VITALS — BP 124/80 | HR 84 | Temp 98.0°F | Resp 16 | Ht 67.0 in | Wt 187.0 lb

## 2014-02-28 DIAGNOSIS — M25569 Pain in unspecified knee: Secondary | ICD-10-CM

## 2014-02-28 DIAGNOSIS — F988 Other specified behavioral and emotional disorders with onset usually occurring in childhood and adolescence: Secondary | ICD-10-CM

## 2014-02-28 DIAGNOSIS — E782 Mixed hyperlipidemia: Secondary | ICD-10-CM

## 2014-02-28 DIAGNOSIS — E559 Vitamin D deficiency, unspecified: Secondary | ICD-10-CM

## 2014-02-28 DIAGNOSIS — R059 Cough, unspecified: Secondary | ICD-10-CM

## 2014-02-28 DIAGNOSIS — J310 Chronic rhinitis: Secondary | ICD-10-CM

## 2014-02-28 DIAGNOSIS — R05 Cough: Secondary | ICD-10-CM

## 2014-02-28 DIAGNOSIS — R7309 Other abnormal glucose: Secondary | ICD-10-CM

## 2014-02-28 DIAGNOSIS — I10 Essential (primary) hypertension: Secondary | ICD-10-CM

## 2014-02-28 DIAGNOSIS — R030 Elevated blood-pressure reading, without diagnosis of hypertension: Secondary | ICD-10-CM

## 2014-02-28 DIAGNOSIS — Z79899 Other long term (current) drug therapy: Secondary | ICD-10-CM

## 2014-02-28 LAB — HEMOGLOBIN A1C
Hgb A1c MFr Bld: 5.9 % — ABNORMAL HIGH (ref <5.7)
MEAN PLASMA GLUCOSE: 123 mg/dL — AB (ref <117)

## 2014-02-28 LAB — CBC WITH DIFFERENTIAL/PLATELET
Basophils Absolute: 0 10*3/uL (ref 0.0–0.1)
Basophils Relative: 0 % (ref 0–1)
EOS PCT: 1 % (ref 0–5)
Eosinophils Absolute: 0.1 10*3/uL (ref 0.0–0.7)
HCT: 41.2 % (ref 36.0–46.0)
HEMOGLOBIN: 13.7 g/dL (ref 12.0–15.0)
LYMPHS PCT: 36 % (ref 12–46)
Lymphs Abs: 2.4 10*3/uL (ref 0.7–4.0)
MCH: 27.3 pg (ref 26.0–34.0)
MCHC: 33.3 g/dL (ref 30.0–36.0)
MCV: 82.1 fL (ref 78.0–100.0)
MONO ABS: 0.8 10*3/uL (ref 0.1–1.0)
MONOS PCT: 12 % (ref 3–12)
NEUTROS ABS: 3.4 10*3/uL (ref 1.7–7.7)
Neutrophils Relative %: 51 % (ref 43–77)
Platelets: 375 10*3/uL (ref 150–400)
RBC: 5.02 MIL/uL (ref 3.87–5.11)
RDW: 14.9 % (ref 11.5–15.5)
WBC: 6.7 10*3/uL (ref 4.0–10.5)

## 2014-02-28 LAB — IRON AND TIBC
%SAT: 19 % — AB (ref 20–55)
Iron: 91 ug/dL (ref 42–145)
TIBC: 468 ug/dL (ref 250–470)
UIBC: 377 ug/dL (ref 125–400)

## 2014-02-28 LAB — VITAMIN B12: VITAMIN B 12: 553 pg/mL (ref 211–911)

## 2014-02-28 MED ORDER — METHYLPHENIDATE HCL ER (CD) 10 MG PO CPCR: 10.0000 mg | ORAL_CAPSULE | Freq: Three times a day (TID) | ORAL | Status: DC

## 2014-02-28 MED ORDER — ELETRIPTAN HYDROBROMIDE 20 MG PO TABS: 20.0000 mg | ORAL_TABLET | ORAL | Status: DC | PRN

## 2014-02-28 NOTE — Progress Notes (Signed)
Subjective:    Patient ID: Kimberly Ray, female    DOB: 25-Nov-1950, 63 y.o.   MRN: [id number]  HPI Comments: 63 yo WF with multiple complaints presents for 3 month F/U for elevated BP w/o HTN, Cholesterol, Pre-Dm, D. deficient She has had increased stress with work. She has been trying to improve diet but is not exercising routinely. She had to reschedule GI apointment and still has not had f/u as advised numerous times for  Colitis flares. She notes most recent flare 2 days ago.   She has had some green stringy mucus in post pharynx. She has had been using claritin D, mucinex and had ENT evaluation and will have CT f/u for chronic sinusitis.   She has had a couple of episodes where she has aspirated food while awake. She notes she has had walking pneumonia in past and recent aspiration almost felt like residual pneumonia.She notes she has felt mildly feverish and is always fatigued.   WBC             9.9   12/05/2013 HGB            13.1   12/05/2013 HCT            39.6   12/05/2013 PLT             302   12/05/2013 GLUCOSE         115   12/05/2013 CHOL            193   12/05/2013 TRIG            193   12/05/2013 HDL              47   12/05/2013 LDLCALC         107   12/05/2013 ALT              30   12/05/2013 AST              29   12/05/2013 NA              138   12/05/2013 K               4.3   12/05/2013 CL              102   12/05/2013 CREATININE     0.71   12/05/2013 BUN              12   12/05/2013 CO2              24   12/05/2013 TSH           1.750   08/31/2013 HGBA1C          5.8   12/05/2013 MICROALBUR     0.60   08/30/2013   Anxiety    Gastrophageal Reflux She complains of abdominal pain and coughing. Associated symptoms include fatigue.      Medication List       This list is accurate as of: 02/28/14 11:56 AM.  Always use your most recent med list.               albuterol 108 (90 BASE) MCG/ACT inhaler  Commonly known as:  PROVENTIL HFA;VENTOLIN HFA  Inhale 2 puffs into  the lungs every 6 (six) hours as needed for wheezing or shortness of breath.     eletriptan 20 MG tablet  Commonly known as:  RELPAX  Take 1 tablet (  20 mg total) by mouth as needed for migraine or headache. 1 po at onset of headache. May repeat in 2 hours if headache persists or recurs.     fish oil-omega-3 fatty acids 1000 MG capsule  Take 1 g by mouth daily.     lamoTRIgine 100 MG tablet  Commonly known as:  LAMICTAL  Take 1 tablet (100 mg total) by mouth 2 (two) times daily.     magnesium 30 MG tablet  Take 30 mg by mouth daily.     metFORMIN 500 MG tablet  Commonly known as:  GLUCOPHAGE  Take 500 mg by mouth daily with breakfast.     methylphenidate 10 MG CR capsule  Commonly known as:  METADATE CD  Take 1 capsule (10 mg total) by mouth 3 (three) times daily.     multivitamin tablet  Take 1 tablet by mouth daily. Multi-vitamin for Women-Take 1 daily     pantoprazole 40 MG tablet  Commonly known as:  PROTONIX  Take 1 tablet (40 mg total) by mouth daily.     RED YEAST RICE PO  Take by mouth daily.     selenium 50 MCG Tabs tablet  Take 50 mcg by mouth daily.     Vilazodone HCl 40 MG Tabs  Commonly known as:  VIIBRYD  Take 1 tablet (40 mg total) by mouth daily.       Allergies  Allergen Reactions  . Erythromycin Other (See Comments)    TBS  . Ivp Dye [Iodinated Diagnostic Agents] Other (See Comments)    Hot flashes, itching scalp  . Penicillins Hives  . Clarithromycin Other (See Comments)    Itching ankles  . Macrodantin [Nitrofurantoin]   . Wellbutrin [Bupropion]    Past Medical History  Diagnosis Date  . Depression   . Migraines   . GERD (gastroesophageal reflux disease)   . Anemia   . Sinus congestion   . Memory loss   . Head trauma   . Hay fever   . OSA on CPAP   . RLS (restless legs syndrome)      Review of Systems  Constitutional: Positive for fever and fatigue.  HENT: Positive for congestion and sinus pressure.   Respiratory: Positive  for cough.   Gastrointestinal: Positive for abdominal pain, diarrhea and abdominal distention.  All other systems reviewed and are negative.  BP 124/80  Pulse 84  Temp(Src) 98 F (36.7 C) (Temporal)  Resp 16  Ht 5\' 7"  (1.702 m)  Wt 187 lb (84.823 kg)  BMI 29.28 kg/m2     Objective:   Physical Exam  Nursing note and vitals reviewed. Constitutional: She is oriented to person, place, and time. She appears well-developed and well-nourished. No distress.  HENT:  Head: Normocephalic and atraumatic.  Right Ear: External ear normal.  Left Ear: External ear normal.  Nose: Nose normal.  Mouth/Throat: Oropharynx is clear and moist.  Eyes: Conjunctivae and EOM are normal.  Neck: Normal range of motion. Neck supple. No JVD present. No thyromegaly present.  Cardiovascular: Normal rate, regular rhythm, normal heart sounds and intact distal pulses.   Pulmonary/Chest: Effort normal and breath sounds normal.  Abdominal: Soft. Bowel sounds are normal. She exhibits no distension and no mass. There is no tenderness. There is no rebound and no guarding.  Musculoskeletal: Normal range of motion. She exhibits no edema and no tenderness.  Lymphadenopathy:    She has no cervical adenopathy.  Neurological: She is alert and oriented to person, place, and  time. No cranial nerve deficit.  Skin: Skin is warm and dry. No rash noted. No erythema. No pallor.  Psychiatric: She has a normal mood and affect. Her behavior is normal. Judgment and thought content normal.          Assessment & Plan:  1.  3 month F/U for elevated BP w/o HTN, Cholesterol, Pre-Dm, D. Deficient. Needs healthy diet, cardio QD and obtain healthy weight. Check Labs, Check BP if >130/80 call office   2. ? Sinusitis- She will check with ENT to see can reschedule CT sinus and let us know if unable to get with ENT, and we can order CT, check labs   3. ? ASpiration pneumonia- get CXR, GET GI moved up to evaluate, may need pulmonology  referral if GI eval NEG  4. Leg pain vs anemia- Check labs vs early neuropathy, increase activity, elevate legs, compression socks  OVER 40 minutes of exam, counseling, chart review, referral discussed. ADVISED NEEDS TO BE COMPLIANT with f/u in order to try to improve overall health

## 2014-02-28 NOTE — Patient Instructions (Signed)
Anemia, Nonspecific Anemia is a condition in which the concentration of red blood cells or hemoglobin in the blood is below normal. Hemoglobin is a substance in red blood cells that carries oxygen to the tissues of the body. Anemia results in not enough oxygen reaching these tissues.  CAUSES  Common causes of anemia include:   Excessive bleeding. Bleeding may be internal or external. This includes excessive bleeding from periods (in women) or from the intestine.   Poor nutrition.   Chronic kidney, thyroid, and liver disease.  Bone marrow disorders that decrease red blood cell production.  Cancer and treatments for cancer.  HIV, AIDS, and their treatments.  Spleen problems that increase red blood cell destruction.  Blood disorders.  Excess destruction of red blood cells due to infection, medicines, and autoimmune disorders. SIGNS AND SYMPTOMS   Minor weakness.   Dizziness.   Headache.  Palpitations.   Shortness of breath, especially with exercise.   Paleness.  Cold sensitivity.  Indigestion.  Nausea.  Difficulty sleeping.  Difficulty concentrating. Symptoms may occur suddenly or they may develop slowly.  DIAGNOSIS  Additional blood tests are often needed. These help your health care provider determine the best treatment. Your health care provider will check your stool for blood and look for other causes of blood loss.  TREATMENT  Treatment varies depending on the cause of the anemia. Treatment can include:   Supplements of iron, vitamin B12, or folic acid.   Hormone medicines.   A blood transfusion. This may be needed if blood loss is severe.   Hospitalization. This may be needed if there is significant continual blood loss.   Dietary changes.  Spleen removal. HOME CARE INSTRUCTIONS Keep all follow-up appointments. It often takes many weeks to correct anemia, and having your health care provider check on your condition and your response to  treatment is very important. SEEK IMMEDIATE MEDICAL CARE IF:   You develop extreme weakness, shortness of breath, or chest pain.   You become dizzy or have trouble concentrating.  You develop heavy vaginal bleeding.   You develop a rash.   You have bloody or black, tarry stools.   You faint.   You vomit up blood.   You vomit repeatedly.   You have abdominal pain.  You have a fever or persistent symptoms for more than 2-3 days.   You have a fever and your symptoms suddenly get worse.   You are dehydrated.  MAKE SURE YOU:  Understand these instructions.  Will watch your condition.  Will get help right away if you are not doing well or get worse. Document Released: 09/11/2004 Document Revised: 04/06/2013 Document Reviewed: 01/28/2013 [employer] Patient Information 2015 [employer]. This information is not intended to replace advice given to you by your health care provider. Make sure you discuss any questions you have with your health care provider.  

## 2014-03-01 ENCOUNTER — Other Ambulatory Visit: Payer: Self-pay | Admitting: Emergency Medicine

## 2014-03-01 ENCOUNTER — Telehealth: Payer: Self-pay

## 2014-03-01 DIAGNOSIS — Z780 Asymptomatic menopausal state: Secondary | ICD-10-CM

## 2014-03-01 LAB — LIPID PANEL
CHOLESTEROL: 219 mg/dL — AB (ref 0–200)
HDL: 53 mg/dL (ref >39)
LDL Cholesterol: 126 mg/dL — ABNORMAL HIGH (ref 0–99)
Total CHOL/HDL Ratio: 4.1 Ratio
Triglycerides: 202 mg/dL — ABNORMAL HIGH (ref <150)
VLDL: 40 mg/dL (ref 0–40)

## 2014-03-01 LAB — VITAMIN D 25 HYDROXY (VIT D DEFICIENCY, FRACTURES): Vit D, 25-Hydroxy: 38 ng/mL (ref 30–89)

## 2014-03-01 LAB — HEPATIC FUNCTION PANEL
ALBUMIN: 4.8 g/dL (ref 3.5–5.2)
ALT: 38 U/L — ABNORMAL HIGH (ref 0–35)
AST: 41 U/L — AB (ref 0–37)
Alkaline Phosphatase: 75 U/L (ref 39–117)
BILIRUBIN DIRECT: 0.1 mg/dL (ref 0.0–0.3)
BILIRUBIN TOTAL: 0.7 mg/dL (ref 0.2–1.2)
Indirect Bilirubin: 0.6 mg/dL (ref 0.2–1.2)
Total Protein: 7.1 g/dL (ref 6.0–8.3)

## 2014-03-01 LAB — BASIC METABOLIC PANEL WITH GFR
BUN: 14 mg/dL (ref 6–23)
CO2: 25 mEq/L (ref 19–32)
CREATININE: 0.76 mg/dL (ref 0.50–1.10)
Calcium: 9.9 mg/dL (ref 8.4–10.5)
Chloride: 103 mEq/L (ref 96–112)
GFR, Est Non African American: 83 mL/min
GLUCOSE: 88 mg/dL (ref 70–99)
POTASSIUM: 3.9 meq/L (ref 3.5–5.3)
Sodium: 140 mEq/L (ref 135–145)

## 2014-03-01 LAB — INSULIN, FASTING: Insulin fasting, serum: 40 u[IU]/mL — ABNORMAL HIGH (ref 3–28)

## 2014-03-01 LAB — MAGNESIUM: MAGNESIUM: 1.9 mg/dL (ref 1.5–2.5)

## 2014-03-01 LAB — FOLATE RBC: RBC Folate: 412 ng/mL (ref >280)

## 2014-03-01 NOTE — Telephone Encounter (Signed)
Patient called c/o increased GI symptoms since OV. Patient is requesting to see any GI doctor tomorrow. No appts at GI for tomorrow. Patient is advised to go to ER if unable to wait until GI appt.

## 2014-03-06 ENCOUNTER — Other Ambulatory Visit: Payer: Self-pay | Admitting: Gastroenterology

## 2014-03-06 DIAGNOSIS — R1032 Left lower quadrant pain: Secondary | ICD-10-CM

## 2014-03-07 ENCOUNTER — Ambulatory Visit
Admission: RE | Admit: 2014-03-07 | Discharge: 2014-03-07 | Disposition: A | Discharge status: Home / Self Care | Payer: Managed Care, Other (non HMO) | Source: Ambulatory Visit | Attending: Gastroenterology | Admitting: Gastroenterology

## 2014-03-07 DIAGNOSIS — R1032 Left lower quadrant pain: Secondary | ICD-10-CM

## 2014-03-15 ENCOUNTER — Ambulatory Visit (INDEPENDENT_AMBULATORY_CARE_PROVIDER_SITE_OTHER): Payer: Managed Care, Other (non HMO) | Admitting: Physician Assistant

## 2014-03-15 ENCOUNTER — Ambulatory Visit: Payer: Self-pay | Admitting: Physician Assistant

## 2014-03-15 ENCOUNTER — Encounter: Payer: Self-pay | Admitting: Physician Assistant

## 2014-03-15 VITALS — BP 132/80 | HR 80 | Temp 97.7°F | Resp 16 | Wt 188.0 lb

## 2014-03-15 DIAGNOSIS — F988 Other specified behavioral and emotional disorders with onset usually occurring in childhood and adolescence: Secondary | ICD-10-CM

## 2014-03-15 DIAGNOSIS — Z79899 Other long term (current) drug therapy: Secondary | ICD-10-CM

## 2014-03-15 LAB — COMPREHENSIVE METABOLIC PANEL
ALK PHOS: 75 U/L (ref 39–117)
ALT: 39 U/L — AB (ref 0–35)
AST: 60 U/L — AB (ref 0–37)
Albumin: 4.7 g/dL (ref 3.5–5.2)
BILIRUBIN TOTAL: 0.6 mg/dL (ref 0.2–1.2)
BUN: 18 mg/dL (ref 6–23)
CO2: 26 meq/L (ref 19–32)
CREATININE: 0.76 mg/dL (ref 0.50–1.10)
Calcium: 10 mg/dL (ref 8.4–10.5)
Chloride: 102 mEq/L (ref 96–112)
Glucose, Bld: 79 mg/dL (ref 70–99)
Potassium: 4.4 mEq/L (ref 3.5–5.3)
Sodium: 140 mEq/L (ref 135–145)
Total Protein: 7 g/dL (ref 6.0–8.3)

## 2014-03-15 LAB — CBC WITH DIFFERENTIAL/PLATELET
BASOS ABS: 0.1 10*3/uL (ref 0.0–0.1)
Basophils Relative: 1 % (ref 0–1)
EOS ABS: 0.1 10*3/uL (ref 0.0–0.7)
Eosinophils Relative: 1 % (ref 0–5)
HCT: 41.1 % (ref 36.0–46.0)
Hemoglobin: 13.4 g/dL (ref 12.0–15.0)
Lymphocytes Relative: 23 % (ref 12–46)
Lymphs Abs: 1.5 10*3/uL (ref 0.7–4.0)
MCH: 27 pg (ref 26.0–34.0)
MCHC: 32.6 g/dL (ref 30.0–36.0)
MCV: 82.7 fL (ref 78.0–100.0)
Monocytes Absolute: 0.7 10*3/uL (ref 0.1–1.0)
Monocytes Relative: 11 % (ref 3–12)
Neutro Abs: 4.2 10*3/uL (ref 1.7–7.7)
Neutrophils Relative %: 64 % (ref 43–77)
PLATELETS: 336 10*3/uL (ref 150–400)
RBC: 4.97 MIL/uL (ref 3.87–5.11)
RDW: 14.6 % (ref 11.5–15.5)
WBC: 6.5 10*3/uL (ref 4.0–10.5)

## 2014-03-15 MED ORDER — METHYLPHENIDATE HCL ER (CD) 10 MG PO CPCR: 10.0000 mg | ORAL_CAPSULE | Freq: Three times a day (TID) | ORAL | Status: DC

## 2014-03-15 MED ORDER — VORTIOXETINE HBR 20 MG PO TABS: ORAL_TABLET | ORAL | Status: DC

## 2014-03-15 NOTE — Progress Notes (Signed)
HPI 63 y.o.female presents for 2 week follow up. Since follow up she has seen GI, Dr. Oletta Lamas and ENT, Dr. Constance Holster, CT AB showed diverticulosis but other wise normal and her CT sinuses showed infection, she took Doxycycline which has helped. She had a negative breast biopsy. She has been walking with her mom, she is living with her parents to help with them and this has added extra stress. She also states at work there is a Hydrologist and a coworker committed suicide 3 weeks ago so she has been feeling a lot of stress/emotions. She feels that the Viibryd is not working as well any more.   Lab Results  Component Value Date   ALT 38* 02/28/2014   AST 41* 02/28/2014   ALKPHOS 75 02/28/2014   BILITOT 0.7 02/28/2014    Past Medical History  Diagnosis Date  . Depression   . Migraines   . GERD (gastroesophageal reflux disease)   . Anemia   . Sinus congestion   . Memory loss   . Head trauma   . Hay fever   . OSA on CPAP   . RLS (restless legs syndrome)      Allergies  Allergen Reactions  . Erythromycin Other (See Comments)    TBS  . Ivp Dye [Iodinated Diagnostic Agents] Other (See Comments)    Hot flashes, itching scalp  . Penicillins Hives  . Clarithromycin Other (See Comments)    Itching ankles  . Macrodantin [Nitrofurantoin]   . Wellbutrin [Bupropion]       Current Outpatient Prescriptions on File Prior to Visit  Medication Sig Dispense Refill  . albuterol (PROVENTIL HFA;VENTOLIN HFA) 108 (90 BASE) MCG/ACT inhaler Inhale 2 puffs into the lungs every 6 (six) hours as needed for wheezing or shortness of breath.  1 Inhaler  2  . eletriptan (RELPAX) 20 MG tablet Take 1 tablet (20 mg total) by mouth as needed for migraine or headache. 1 po at onset of headache. May repeat in 2 hours if headache persists or recurs.  8 tablet  2  . eletriptan (RELPAX) 20 MG tablet Take 1 tablet (20 mg total) by mouth as needed for migraine or headache. 1 po AD. May repeat in 2 hours. Max 80 mg/24h  8 tablet  2   . fish oil-omega-3 fatty acids 1000 MG capsule Take 1 g by mouth daily.      Marland Kitchen lamoTRIgine (LAMICTAL) 100 MG tablet Take 1 tablet (100 mg total) by mouth 2 (two) times daily.  180 tablet  1  . metFORMIN (GLUCOPHAGE) 500 MG tablet Take 500 mg by mouth daily with breakfast.       . methylphenidate (METADATE CD) 10 MG CR capsule Take 1 capsule (10 mg total) by mouth 3 (three) times daily.  90 capsule  0  . Multiple Vitamin (MULTIVITAMIN) tablet Take 1 tablet by mouth daily. Multi-vitamin for Women-Take 1 daily      . pantoprazole (PROTONIX) 40 MG tablet Take 1 tablet (40 mg total) by mouth daily.  90 tablet  2  . Red Yeast Rice Extract (RED YEAST RICE PO) Take by mouth daily.      Marland Kitchen selenium 50 MCG TABS Take 50 mcg by mouth daily.      . Vilazodone HCl (VIIBRYD) 40 MG TABS Take 1 tablet (40 mg total) by mouth daily.  90 tablet  1   No current facility-administered medications on file prior to visit.    ROS: all negative expect above.   Physical: Danley Danker  Weights   03/15/14 0922  Weight: 188 lb (85.276 kg)   BP 132/80  Pulse 80  Temp(Src) 97.7 F (36.5 C)  Resp 16  Wt 188 lb (85.276 kg) General Appearance: Well nourished, in no apparent distress. Eyes: PERRLA, EOMs. Sinuses: No Frontal/maxillary tenderness ENT/Mouth: Ext aud canals clear, normal light reflex with TMs without erythema, bulging. Post pharynx without erythema, swelling, exudate.  Respiratory: CTAB Cardio: RRR, no murmurs, rubs or gallops. Peripheral pulses brisk and equal bilaterally, without edema. No aortic or femoral bruits. Abdomen: Soft, with bowl sounds. Nontender, no guarding, rebound. Lymphatics: Non tender without lymphadenopathy.  Musculoskeletal: Full ROM all peripheral extremities, 5/5 strength, and normal gait. Skin: Warm, dry without rashes, lesions, ecchymosis.  Neuro: Cranial nerves intact, reflexes equal bilaterally. Normal muscle tone, no cerebellar symptoms. Sensation intact.  Pysch: Awake and oriented  X 3, normal affect, Insight and Judgment appropriate.   Assessment and Plan: Elevated LFTs- check levels Depression/anxiety- states has had thought of suicide but no plan at this time and understands that her family needs her- she would like to try to come off the Viibryd, will switch to Brintellix, will do everyother day and then switch completely to brintellix. Diverticulosis- increase fiber, diet discussed Obesity with co morbidities- long discussion about weight loss, diet, and exercise

## 2014-03-15 NOTE — Patient Instructions (Addendum)
Please cut Viibyrd in half for 2-3 days then start on brintellix and 1/2 of the viibryd everyother day for 1 week, then switch to the Brintellix 10mg  daily.   Depression Depression refers to feeling sad, low, down in the dumps, blue, gloomy, or empty. In general, there are two kinds of depression: 1. Normal sadness or normal grief. This kind of depression is one that we all feel from time to time after upsetting life experiences, such as the loss of a job or the ending of a relationship. This kind of depression is considered normal, is short lived, and resolves within a few days to 2 weeks. Depression experienced after the loss of a loved one (bereavement) often lasts longer than 2 weeks but normally gets better with time. 2. Clinical depression. This kind of depression lasts longer than normal sadness or normal grief or interferes with your ability to function at home, at work, and in school. It also interferes with your personal relationships. It affects almost every aspect of your life. Clinical depression is an illness. Symptoms of depression can also be caused by conditions other than those mentioned above, such as:  Physical illness. Some physical illnesses, including underactive thyroid gland (hypothyroidism), severe anemia, specific types of cancer, diabetes, uncontrolled seizures, heart and lung problems, strokes, and chronic pain are commonly associated with symptoms of depression.  Side effects of some prescription medicine. In some people, certain types of medicine can cause symptoms of depression.  Substance abuse. Abuse of alcohol and illicit drugs can cause symptoms of depression. SYMPTOMS Symptoms of normal sadness and normal grief include the following:  Feeling sad or crying for short periods of time.  Not caring about anything (apathy).  Difficulty sleeping or sleeping too much.  No longer able to enjoy the things you used to enjoy.  Desire to be by oneself all the time  (social isolation).  Lack of energy or motivation.  Difficulty concentrating or remembering.  Change in appetite or weight.  Restlessness or agitation. Symptoms of clinical depression include the same symptoms of normal sadness or normal grief and also the following symptoms:  Feeling sad or crying all the time.  Feelings of guilt or worthlessness.  Feelings of hopelessness or helplessness.  Thoughts of suicide or the desire to harm yourself (suicidal ideation).  Loss of touch with reality (psychotic symptoms). Seeing or hearing things that are not real (hallucinations) or having false beliefs about your life or the people around you (delusions and paranoia). DIAGNOSIS  The diagnosis of clinical depression is usually based on how bad the symptoms are and how long they have lasted. Your health care provider will also ask you questions about your medical history and substance use to find out if physical illness, use of prescription medicine, or substance abuse is causing your depression. Your health care provider may also order blood tests. TREATMENT  Often, normal sadness and normal grief do not require treatment. However, sometimes antidepressant medicine is given for bereavement to ease the depressive symptoms until they resolve. The treatment for clinical depression depends on how bad the symptoms are but often includes antidepressant medicine, counseling with a mental health professional, or both. Your health care provider will help to determine what treatment is best for you. Depression caused by physical illness usually goes away with appropriate medical treatment of the illness. If prescription medicine is causing depression, talk with your health care provider about stopping the medicine, decreasing the dose, or changing to another medicine. Depression caused by  the abuse of alcohol or illicit drugs goes away when you stop using these substances. Some adults need professional help in  order to stop drinking or using drugs. SEEK IMMEDIATE MEDICAL CARE IF:  You have thoughts about hurting yourself or others.  You lose touch with reality (have psychotic symptoms).  You are taking medicine for depression and have a serious side effect. FOR MORE INFORMATION  National [employer] on Mental Illness: www.nami.[employer]: https://carter.com/ Document Released: 08/01/2000 Document Revised: 12/19/2013 Document Reviewed: 11/03/2011 [facility] Patient Information 2015 [place], [place]. This information is not intended to replace advice given to you by your health care provider. Make sure you discuss any questions you have with your health care provider.

## 2014-03-16 ENCOUNTER — Ambulatory Visit: Payer: Self-pay | Admitting: Emergency Medicine

## 2014-03-23 ENCOUNTER — Ambulatory Visit (HOSPITAL_COMMUNITY)
Admission: RE | Admit: 2014-03-23 | Discharge: 2014-03-23 | Disposition: A | Discharge status: Home / Self Care | Payer: Managed Care, Other (non HMO) | Source: Ambulatory Visit | Attending: Emergency Medicine | Admitting: Emergency Medicine

## 2014-03-23 ENCOUNTER — Encounter (INDEPENDENT_AMBULATORY_CARE_PROVIDER_SITE_OTHER): Payer: Self-pay | Admitting: General Surgery

## 2014-03-23 ENCOUNTER — Ambulatory Visit (INDEPENDENT_AMBULATORY_CARE_PROVIDER_SITE_OTHER): Payer: Medicare HMO | Admitting: General Surgery

## 2014-03-23 VITALS — BP 118/78 | HR 84 | Temp 97.8°F | Ht 67.0 in | Wt 189.0 lb

## 2014-03-23 DIAGNOSIS — R222 Localized swelling, mass and lump, trunk: Secondary | ICD-10-CM

## 2014-03-23 DIAGNOSIS — Z13820 Encounter for screening for osteoporosis: Secondary | ICD-10-CM | POA: Insufficient documentation

## 2014-03-23 DIAGNOSIS — R229 Localized swelling, mass and lump, unspecified: Secondary | ICD-10-CM

## 2014-03-23 DIAGNOSIS — Z780 Asymptomatic menopausal state: Secondary | ICD-10-CM

## 2014-03-23 NOTE — Progress Notes (Signed)
Patient ID: Kimberly Ray, female   DOB: 1950-10-07, 62 y.o.   MRN: [id number]  Chief Complaint  Patient presents with  . eval lipoma on back    HPI Kimberly Ray is a 63 y.o. female.  The patient is a 63 year old female who is referred by the [place], [place], for evaluation of a mid back mass. He states that for several years. She states become more bothersome and painful when she sits back. Patient works as a Catering manager and states 1 sitting in her seat this painful.  HPI  Past Medical History  Diagnosis Date  . Depression   . Migraines   . GERD (gastroesophageal reflux disease)   . Anemia   . Sinus congestion   . Memory loss   . Head trauma   . Hay fever   . OSA on CPAP   . RLS (restless legs syndrome)     Past Surgical History  Procedure Laterality Date  . Perforated eardrum      left side  . Nasal sinus surgery    . Tonsillectomy and adenoidectomy    . Kidney stone removed      Family History  Problem Relation Age of Onset  . Diabetes Mother   . Diabetes Sister   . Breast cancer Maternal Grandmother     Social History History  Substance Use Topics  . Smoking status: Never Smoker   . Smokeless tobacco: Never Used  . Alcohol Use: 0.6 oz/week    1 Shots of liquor per week     Comment: Once a year    Allergies  Allergen Reactions  . Erythromycin Other (See Comments)    TBS  . Ivp Dye [Iodinated Diagnostic Agents] Other (See Comments)    Hot flashes, itching scalp  . Penicillins Hives  . Clarithromycin Other (See Comments)    Itching ankles  . Macrodantin [Nitrofurantoin]   . Wellbutrin [Bupropion]     Current Outpatient Prescriptions  Medication Sig Dispense Refill  . albuterol (PROVENTIL HFA;VENTOLIN HFA) 108 (90 BASE) MCG/ACT inhaler Inhale 2 puffs into the lungs every 6 (six) hours as needed for wheezing or shortness of breath.  1 Inhaler  2  . eletriptan (RELPAX) 20 MG tablet Take 1 tablet (20 mg total) by mouth as needed for migraine or  headache. 1 po at onset of headache. May repeat in 2 hours if headache persists or recurs.  8 tablet  2  . eletriptan (RELPAX) 20 MG tablet Take 1 tablet (20 mg total) by mouth as needed for migraine or headache. 1 po AD. May repeat in 2 hours. Max 80 mg/24h  8 tablet  2  . fish oil-omega-3 fatty acids 1000 MG capsule Take 1 g by mouth daily.      Marland Kitchen lamoTRIgine (LAMICTAL) 100 MG tablet Take 1 tablet (100 mg total) by mouth 2 (two) times daily.  180 tablet  1  . MAGNESIUM PO Take 250 mg by mouth daily.      . metFORMIN (GLUCOPHAGE) 500 MG tablet Take 500 mg by mouth daily with breakfast.       . methylphenidate (METADATE CD) 10 MG CR capsule Take 1 capsule (10 mg total) by mouth 3 (three) times daily.  30 capsule  0  . Multiple Vitamin (MULTIVITAMIN) tablet Take 1 tablet by mouth daily. Multi-vitamin for Women-Take 1 daily      . pantoprazole (PROTONIX) 40 MG tablet Take 1 tablet (40 mg total) by mouth daily.  90 tablet  2  .  Red Yeast Rice Extract (RED YEAST RICE PO) Take by mouth daily.      Marland Kitchen selenium 50 MCG TABS Take 50 mcg by mouth daily.      . Vilazodone HCl (VIIBRYD) 40 MG TABS Take 1 tablet (40 mg total) by mouth daily.  90 tablet  1  . Vortioxetine HBr (BRINTELLIX) 20 MG TABS 1/2-1 pill daily for depression  30 tablet  2   No current facility-administered medications for this visit.    Review of Systems Review of Systems  Constitutional: Negative.   HENT: Negative.   Respiratory: Negative.   Cardiovascular: Negative.   Gastrointestinal: Negative.   Neurological: Negative.   All other systems reviewed and are negative.   Blood pressure 118/78, pulse 84, temperature 97.8 F (36.6 C), height 5\' 7"  (1.702 m), weight 189 lb (85.73 kg).  Physical Exam Physical Exam  Constitutional: She is oriented to person, place, and time. She appears well-developed and well-nourished.  HENT:  Head: Normocephalic and atraumatic.  Eyes: Conjunctivae and EOM are normal. Pupils are equal, round,  and reactive to light.  Neck: Normal range of motion. Neck supple.  Cardiovascular: Normal rate, regular rhythm and normal heart sounds.   Pulmonary/Chest: Effort normal and breath sounds normal.  Abdominal: Soft. Bowel sounds are normal. She exhibits no distension and no mass. There is no tenderness. There is no rebound and no guarding.  Musculoskeletal: Normal range of motion.       Arms: 2-3cm subq mass  Neurological: She is alert and oriented to person, place, and time.  Skin: Skin is warm and dry.  Psychiatric: She has a normal mood and affect.    Data Reviewed none  Assessment    63 year old female with a likely epidermal inclusion cyst of the midback     Plan    1. The patient elected to proceed to the operating room for excision of the cyst versus lipoma. 2. I discussed with her the risks and benefits of the procedure to include but not limited to, infection bleeding, demonstrated structures, possible recurrence. Patient voiced understanding and wished to proceed.        Rosario Jacks., Willford Rabideau 03/23/2014, 10:41 AM

## 2014-03-29 ENCOUNTER — Ambulatory Visit (HOSPITAL_COMMUNITY): Payer: Managed Care, Other (non HMO)

## 2014-04-21 ENCOUNTER — Ambulatory Visit: Payer: Self-pay | Admitting: Physician Assistant

## 2014-05-04 ENCOUNTER — Other Ambulatory Visit: Payer: Self-pay | Admitting: Internal Medicine

## 2014-05-04 DIAGNOSIS — F988 Other specified behavioral and emotional disorders with onset usually occurring in childhood and adolescence: Secondary | ICD-10-CM

## 2014-05-04 MED ORDER — METHYLPHENIDATE HCL ER (CD) 10 MG PO CPCR: 10.0000 mg | ORAL_CAPSULE | Freq: Three times a day (TID) | ORAL | Status: DC

## 2014-05-08 ENCOUNTER — Encounter: Payer: Self-pay | Admitting: Internal Medicine

## 2014-05-08 ENCOUNTER — Ambulatory Visit (INDEPENDENT_AMBULATORY_CARE_PROVIDER_SITE_OTHER): Payer: Managed Care, Other (non HMO) | Admitting: Internal Medicine

## 2014-05-08 ENCOUNTER — Telehealth: Payer: Self-pay

## 2014-05-08 VITALS — BP 120/82 | HR 82 | Temp 98.6°F | Resp 16 | Ht 67.0 in | Wt 185.0 lb

## 2014-05-08 DIAGNOSIS — F988 Other specified behavioral and emotional disorders with onset usually occurring in childhood and adolescence: Secondary | ICD-10-CM

## 2014-05-08 DIAGNOSIS — F341 Dysthymic disorder: Secondary | ICD-10-CM

## 2014-05-08 DIAGNOSIS — M79609 Pain in unspecified limb: Secondary | ICD-10-CM

## 2014-05-08 MED ORDER — BUPROPION HCL ER (XL) 150 MG PO TB24: ORAL_TABLET | ORAL | Status: DC

## 2014-05-08 MED ORDER — METHYLPHENIDATE HCL ER (CD) 10 MG PO CPCR: 10.0000 mg | ORAL_CAPSULE | Freq: Three times a day (TID) | ORAL | Status: DC

## 2014-05-08 MED ORDER — FLUOXETINE HCL 20 MG PO CAPS: 20.0000 mg | ORAL_CAPSULE | Freq: Every day | ORAL | Status: DC

## 2014-05-08 NOTE — Patient Instructions (Signed)
Stop Viibyrd  Start Prozac 1 capsule daily  Start Bupropion 150 mg XL 1 tablet daily

## 2014-05-08 NOTE — Telephone Encounter (Signed)
[facility] last [facility] notes, last lab results, and last EKG. Records were faxed to [phone]

## 2014-05-09 ENCOUNTER — Other Ambulatory Visit: Payer: Self-pay | Admitting: Internal Medicine

## 2014-05-09 ENCOUNTER — Other Ambulatory Visit (INDEPENDENT_AMBULATORY_CARE_PROVIDER_SITE_OTHER): Payer: Managed Care, Other (non HMO)

## 2014-05-09 DIAGNOSIS — Z79899 Other long term (current) drug therapy: Secondary | ICD-10-CM

## 2014-05-09 DIAGNOSIS — R740 Nonspecific elevation of levels of transaminase and lactic acid dehydrogenase [LDH]: Principal | ICD-10-CM

## 2014-05-09 DIAGNOSIS — R7402 Elevation of levels of lactic acid dehydrogenase (LDH): Secondary | ICD-10-CM

## 2014-05-09 DIAGNOSIS — R5383 Other fatigue: Secondary | ICD-10-CM

## 2014-05-09 DIAGNOSIS — R7401 Elevation of levels of liver transaminase levels: Secondary | ICD-10-CM

## 2014-05-09 DIAGNOSIS — M79609 Pain in unspecified limb: Secondary | ICD-10-CM

## 2014-05-09 DIAGNOSIS — R7309 Other abnormal glucose: Secondary | ICD-10-CM

## 2014-05-09 DIAGNOSIS — E559 Vitamin D deficiency, unspecified: Secondary | ICD-10-CM

## 2014-05-09 DIAGNOSIS — R5381 Other malaise: Secondary | ICD-10-CM

## 2014-05-09 NOTE — Progress Notes (Signed)
   Subjective:    Patient ID: Kimberly Ray, female    DOB: 14-Mar-1951, 63 y.o.   MRN: [id number]  Anxiety Presents for initial (Presents for initial visit with me , but does have long standing hx/o depression.) visit. Onset was more than 5 years ago. The problem has been waxing and waning (Patient has  had improvement on various med regimens in the past , but also has developed tachyphalaxis or side effects  to different medications.). Symptoms include decreased concentration, depressed mood, dizziness, excessive worry, insomnia, irritability, malaise, muscle tension, nervous/anxious behavior, obsessions, palpitations and panic. Patient reports no chest pain, compulsions, confusion, dry mouth, feeling of choking, hyperventilation, impotence, nausea, restlessness, shortness of breath or suicidal ideas. Symptoms occur occasionally.        Review of Systems  Constitutional: Positive for irritability and fatigue. Negative for fever, chills and diaphoresis.  Eyes: Negative.   Respiratory: Negative.  Negative for shortness of breath.   Cardiovascular: Positive for palpitations. Negative for chest pain.  Gastrointestinal: Negative.  Negative for nausea.  Endocrine: Negative.   Genitourinary: Negative.  Negative for impotence.  Musculoskeletal: Positive for arthralgias and myalgias.  Neurological: Positive for dizziness, tremors, light-headedness, numbness and headaches. Negative for seizures, syncope, speech difficulty and weakness.  Psychiatric/Behavioral: Positive for decreased concentration. Negative for suicidal ideas and confusion. The patient is nervous/anxious and has insomnia.        Objective:   Physical Exam  Constitutional: She appears well-developed and well-nourished.  HENT:  Mouth/Throat: Oropharynx is clear and moist.  WNL  Eyes: Conjunctivae and EOM are normal. Pupils are equal, round, and reactive to light. Right eye exhibits no discharge. Left eye exhibits no discharge.   Neck: Normal range of motion. Neck supple. No JVD present. No thyromegaly present.  Cardiovascular: Normal rate, regular rhythm and normal heart sounds.   No murmur heard. Pulmonary/Chest: Effort normal. No respiratory distress. She has no wheezes. She has no rales. She exhibits no tenderness.  Musculoskeletal: Normal range of motion.  Lymphadenopathy:    She has no cervical adenopathy.  Neurological: She is alert. She has normal reflexes. No cranial nerve deficit. Coordination normal.  Skin: Skin is warm and dry. No rash noted. No erythema. No pallor.  Psychiatric:  Affect is flat and somewhat melancholic. Thought content is flat. No delusions or suicidal ideations. Judgement seems appropriate.           Assessment & Plan:   1. Pain in extremity, generalized Myalgias  - recheck labs tomorrow  2. Depression  - will d/c and switch Viibryd due  to perceived clouded thinking to Prozac which she feels helped her in the past and also add back lower dose of Bupropion which she felt intolerant to higher dose in the past . ROV - 3 weeks.  3. ADD (attention deficit disorder)  -Refill - methylphenidate (METADATE CD) 10 MG CR capsule; Take 1 capsule (10 mg total) by mouth 3 (three) times daily.  Dispense: 90 capsule; Refill: 0  4. PreDiabetes.   -recheck labs in am  5. Abn LFT's  - Recheck labs in am

## 2014-05-10 LAB — HEPATIC FUNCTION PANEL
ALT: 28 U/L (ref 0–35)
AST: 41 U/L — AB (ref 0–37)
Albumin: 4.5 g/dL (ref 3.5–5.2)
Alkaline Phosphatase: 86 U/L (ref 39–117)
Bilirubin, Direct: 0.1 mg/dL (ref 0.0–0.3)
Indirect Bilirubin: 0.5 mg/dL (ref 0.2–1.2)
TOTAL PROTEIN: 6.8 g/dL (ref 6.0–8.3)
Total Bilirubin: 0.6 mg/dL (ref 0.2–1.2)

## 2014-05-10 LAB — CBC WITH DIFFERENTIAL/PLATELET
BASOS ABS: 0 10*3/uL (ref 0.0–0.1)
BASOS PCT: 0 % (ref 0–1)
EOS ABS: 0.1 10*3/uL (ref 0.0–0.7)
Eosinophils Relative: 2 % (ref 0–5)
HCT: 39.8 % (ref 36.0–46.0)
Hemoglobin: 12.9 g/dL (ref 12.0–15.0)
LYMPHS ABS: 1.6 10*3/uL (ref 0.7–4.0)
Lymphocytes Relative: 22 % (ref 12–46)
MCH: 27.2 pg (ref 26.0–34.0)
MCHC: 32.4 g/dL (ref 30.0–36.0)
MCV: 84 fL (ref 78.0–100.0)
Monocytes Absolute: 0.6 10*3/uL (ref 0.1–1.0)
Monocytes Relative: 8 % (ref 3–12)
NEUTROS PCT: 68 % (ref 43–77)
Neutro Abs: 5 10*3/uL (ref 1.7–7.7)
PLATELETS: 373 10*3/uL (ref 150–400)
RBC: 4.74 MIL/uL (ref 3.87–5.11)
RDW: 13.8 % (ref 11.5–15.5)
WBC: 7.3 10*3/uL (ref 4.0–10.5)

## 2014-05-10 LAB — C-REACTIVE PROTEIN: CRP: 1.9 mg/dL — ABNORMAL HIGH (ref <0.60)

## 2014-05-10 LAB — HEMOGLOBIN A1C
Hgb A1c MFr Bld: 6 % — ABNORMAL HIGH (ref <5.7)
MEAN PLASMA GLUCOSE: 126 mg/dL — AB (ref <117)

## 2014-05-10 LAB — TSH: TSH: 1.636 u[IU]/mL (ref 0.350–4.500)

## 2014-05-10 LAB — CK: CK TOTAL: 53 U/L (ref 7–177)

## 2014-05-10 LAB — INSULIN, FASTING: Insulin fasting, serum: 32.8 u[IU]/mL — ABNORMAL HIGH (ref 2.0–19.6)

## 2014-05-10 LAB — VITAMIN D 25 HYDROXY (VIT D DEFICIENCY, FRACTURES): Vit D, 25-Hydroxy: 53 ng/mL (ref 30–89)

## 2014-05-10 LAB — SEDIMENTATION RATE: SED RATE: 12 mm/h (ref 0–22)

## 2014-05-10 LAB — ANTI-DNA ANTIBODY, DOUBLE-STRANDED: ds DNA Ab: <1 IU/mL

## 2014-05-10 LAB — MAGNESIUM: MAGNESIUM: 1.9 mg/dL (ref 1.5–2.5)

## 2014-05-11 LAB — ALDOLASE: ALDOLASE: 6.9 U/L (ref <=8.1)

## 2014-05-11 NOTE — Progress Notes (Signed)
Patient ID: Kimberly Ray, female   DOB: 23-Sep-1950, 63 y.o.   MRN: [id number] Mailed FMLA forms to patient. Patient aware.

## 2014-05-12 ENCOUNTER — Other Ambulatory Visit (INDEPENDENT_AMBULATORY_CARE_PROVIDER_SITE_OTHER): Payer: Self-pay | Admitting: General Surgery

## 2014-05-22 ENCOUNTER — Other Ambulatory Visit: Payer: Self-pay | Admitting: Internal Medicine

## 2014-05-22 DIAGNOSIS — F319 Bipolar disorder, unspecified: Secondary | ICD-10-CM

## 2014-05-22 MED ORDER — BUPROPION HCL ER (XL) 150 MG PO TB24: ORAL_TABLET | ORAL | Status: DC

## 2014-05-22 MED ORDER — FLUOXETINE HCL 20 MG PO CAPS: 20.0000 mg | ORAL_CAPSULE | Freq: Every day | ORAL | Status: DC

## 2014-05-23 ENCOUNTER — Other Ambulatory Visit: Payer: Self-pay | Admitting: *Deleted

## 2014-06-02 ENCOUNTER — Ambulatory Visit: Payer: Self-pay | Admitting: Physician Assistant

## 2014-06-07 ENCOUNTER — Ambulatory Visit: Payer: Managed Care, Other (non HMO) | Admitting: Internal Medicine

## 2014-06-07 ENCOUNTER — Encounter: Payer: Self-pay | Admitting: Internal Medicine

## 2014-06-07 VITALS — BP 124/82 | HR 76 | Temp 98.1°F | Resp 16 | Ht 67.0 in | Wt 185.0 lb

## 2014-06-07 DIAGNOSIS — F339 Major depressive disorder, recurrent, unspecified: Secondary | ICD-10-CM | POA: Insufficient documentation

## 2014-06-07 DIAGNOSIS — M79609 Pain in unspecified limb: Secondary | ICD-10-CM

## 2014-06-07 DIAGNOSIS — F319 Bipolar disorder, unspecified: Secondary | ICD-10-CM

## 2014-06-07 MED ORDER — FLUOXETINE HCL 40 MG PO CAPS: ORAL_CAPSULE | ORAL | Status: DC

## 2014-06-07 MED ORDER — BUPROPION HCL ER (XL) 300 MG PO TB24: ORAL_TABLET | ORAL | Status: DC

## 2014-06-07 MED ORDER — BUPROPION HCL ER (XL) 150 MG PO TB24: ORAL_TABLET | ORAL | Status: DC

## 2014-06-07 NOTE — Progress Notes (Signed)
Subjective:    Patient ID: Kimberly Ray, female    DOB: 1951-05-27, 63 y.o.   MRN: [id number]  Clio very nice 63 yo DWF presents for 1 mo  F/u of med change from Brintillex failing her Depression and "clouding "her" thinking". Since switched to Prozac 20 and Bupropion 150, she report onlys slight improvement and still feeling depressed & emotionally flat. Her thinking seems clearer to her. She has restarted her Vit D. Continues to have aching in her limb girdle musculature. Denies suicidal ideations.     Medication List   albuterol 108 (90 BASE) MCG/ACT inhaler  Commonly known as:  PROVENTIL HFA;VENTOLIN HFA  Inhale 2 puffs into the lungs every 6 (six) hours as needed for wheezing or shortness of breath.     buPROPion 300 MG 24 hr tablet  Commonly known as:  WELLBUTRIN XL  Take 1 tablet daily  for depression     clindamycin 300 MG capsule  Commonly known as:  CLEOCIN     eletriptan 20 MG tablet  Commonly known as:  RELPAX  Take 1 tablet (20 mg total) by mouth as needed for migraine or headache. 1 po at onset of headache. May repeat in 2 hours if headache persists or recurs.     fish oil-omega-3 fatty acids 1000 MG capsule  Take 1 g by mouth daily.     FLUoxetine 40 MG capsule  Commonly known as:  PROZAC  Take 1 capsule daily for mood & Depression     lamoTRIgine 100 MG tablet  Commonly known as:  LAMICTAL  Take 1 tablet (100 mg total) by mouth 2 (two) times daily.     MAGNESIUM PO  Take 250 mg by mouth daily.     metFORMIN 500 MG tablet  Commonly known as:  GLUCOPHAGE  Take 500 mg by mouth daily with breakfast.     methylphenidate 10 MG CR capsule  Commonly known as:  METADATE CD  Take 1 capsule (10 mg total) by mouth 3 (three) times daily.     multivitamin tablet  Take 1 tablet by mouth daily. Multi-vitamin for Women-Take 1 daily     pantoprazole 40 MG tablet  Commonly known as:  PROTONIX  Take 1 tablet (40 mg total) by mouth daily.     VICODIN 5-300 MG Tabs   Generic drug:  Hydrocodone-Acetaminophen       Allergies  Allergen Reactions  . Erythromycin Other (See Comments)    TBS  . Ivp Dye [Iodinated Diagnostic Agents] Other (See Comments)    Hot flashes, itching scalp  . Penicillins Hives  . Clarithromycin Other (See Comments)    Itching ankles  . Brintellix [Vortioxetine]     Itching  . Macrodantin [Nitrofurantoin]   . Wellbutrin [Bupropion]    Past Medical History  Diagnosis Date  . Depression   . Migraines   . GERD (gastroesophageal reflux disease)   . Anemia   . Sinus congestion   . Memory loss   . Head trauma   . Hay fever   . OSA on CPAP   . RLS (restless legs syndrome)    Review of Systems Negative & noncontributory to above.  Objective:   Physical Exam  BP 124/82  Pulse 76  Temp(Src) 98.1 F (36.7 C) (Temporal)  Resp 16  Ht 5\' 7"  (1.702 m)  Wt 185 lb (83.915 kg)  BMI 28.97 kg/m2  HEENT - Neg Neck - supple. Chest - Clear Cor - RRR w/o sig MAbd -  No palpable organomegaly, masses or tenderness. BS nl. MS- FROM w/o deformities. Muscle power, tone and bulk Nl. Gait Nl. Neuro - No obvious Cr N abnormalities. Sensory, motor and Cerebellar functions appear Nl w/o focal abnormalities. Psyche - Mental status seems clear with flat affect. Judgement seems appropriate.  No delusions or ideations.  Assessment & Plan:   1) Myalgias - suspect FM  2) Depression  Discussed increasing Prozac to 40 mg and Bupropion to 300 XL daily. Also discussed importance of Exercise in battling Depression.

## 2014-06-08 ENCOUNTER — Ambulatory Visit: Payer: Self-pay | Admitting: Physician Assistant

## 2014-07-07 ENCOUNTER — Ambulatory Visit: Payer: Self-pay | Admitting: Internal Medicine

## 2014-07-10 ENCOUNTER — Encounter: Payer: Self-pay | Admitting: Emergency Medicine

## 2014-07-20 ENCOUNTER — Ambulatory Visit (INDEPENDENT_AMBULATORY_CARE_PROVIDER_SITE_OTHER): Payer: Managed Care, Other (non HMO) | Admitting: Internal Medicine

## 2014-07-20 VITALS — BP 122/80 | HR 80 | Temp 98.1°F | Resp 16 | Ht 67.0 in | Wt 185.4 lb

## 2014-07-20 DIAGNOSIS — F319 Bipolar disorder, unspecified: Secondary | ICD-10-CM

## 2014-07-20 DIAGNOSIS — R030 Elevated blood-pressure reading, without diagnosis of hypertension: Secondary | ICD-10-CM

## 2014-07-20 DIAGNOSIS — G43009 Migraine without aura, not intractable, without status migrainosus: Secondary | ICD-10-CM

## 2014-07-20 DIAGNOSIS — F32A Depression, unspecified: Secondary | ICD-10-CM

## 2014-07-20 DIAGNOSIS — F329 Major depressive disorder, single episode, unspecified: Secondary | ICD-10-CM

## 2014-07-21 ENCOUNTER — Encounter: Payer: Self-pay | Admitting: Internal Medicine

## 2014-07-21 MED ORDER — FLUOXETINE HCL 40 MG PO CAPS
ORAL_CAPSULE | ORAL | Status: DC
Start: 2014-07-21 — End: 2014-08-09

## 2014-07-21 MED ORDER — FLUOXETINE HCL 40 MG PO CAPS: ORAL_CAPSULE | ORAL | Status: DC

## 2014-07-21 MED ORDER — BUPROPION HCL ER (XL) 300 MG PO TB24: ORAL_TABLET | ORAL | Status: DC

## 2014-07-21 NOTE — Progress Notes (Signed)
Subjective:    Patient ID: Kimberly Ray, female    DOB: 05-31-1951, 63 y.o.   MRN: [id number]  HPI  Patient returns for 1 month f/u after restarting & then increasing Fluoxetine & Bupropion after failure on Brintellix. Patient reports improved sense of well being for the last week since increasing dose of medications. Denies any apparent SE's.   Medication Sig  . albuterol HFA inhaler Inhale 2 puffs into the lungs every 6 (six) hours as needed for wheezing or shortness of breath.  . eletriptan (RELPAX) 20 MG tablet 1 po at onset of headache. May repeat in 2 hours   . fish oil 1000 MG cap Take 1 g by mouth daily.  Marland Kitchen lamoTRIgine (LAMICTAL) 100 MG tablet Take 1 tablet (100 mg total) by mouth 2 (two) times daily.  . metFORMIN XR 500 MG tablet Take 500 mg by mouth daily with breakfast.   . pantoprazole  40 MG tablet Take 1 tablet (40 mg total) by mouth daily.  Marland Kitchen VICODIN 5-300 MG TABS   . buPROPion  XL) 300 MG  Take 1 tablet daily  for depression  . FLUoxetine 40 MG capsule Take 1 capsule daily for mood & Depression  . MAGNESIUM Take 250 mg by mouth daily.  . MULTIVITAMIN Take 1 tablet by mouth daily. Multi-vitamin for Women-Take 1 daily   Allergies  Allergen Reactions  . Erythromycin Other (See Comments)    TBS  . Ivp Dye [Iodinated Diagnostic Agents] Other (See Comments)    Hot flashes, itching scalp  . Penicillins Hives  . Clarithromycin Other (See Comments)    Itching ankles  . Brintellix [Vortioxetine]     Itching  . Macrodantin [Nitrofurantoin]    Past Medical History  Diagnosis Date  . Depression   . Migraines   . GERD (gastroesophageal reflux disease)   . Anemia   . Sinus congestion   . Memory loss   . Head trauma   . Hay fever   . OSA on CPAP   . RLS (restless legs syndrome)    Review of Systems non contributory to above    Objective:   Physical Exam  BP 122/80   Pulse 80  Temp 98.1 F   Resp 16  Ht 5\' 7"    Wt 185 lb 6.4 oz   BMI 29.03  HEENT - Eac's  patent. TM's Nl. EOM's full. PERRLA. NasoOroPharynx clear. Neck - supple. Nl Thyroid. Carotids 2+ & No bruits, nodes, JVD Chest - Clear equal BS w/o Rales, rhonchi, wheezes. Cor - Nl HS. RRR w/o sig MGR. PP 1(+). No edema. Abd - Benign. MS- FROM w/o deformities. Muscle power, tone and bulk Nl. Gait Nl. Neuro - No obvious Cr N abnormalities. Sensory, motor and Cerebellar functions appear Nl w/o focal abnormalities. Psyche - Mental status normal & appropriate.  No delusions, ideations or obvious mood abnormalities.    Assessment & Plan:   1. Elevated blood pressure reading without diagnosis of hypertension   2. Migraine without aura and without status migrainosus, not intractable   3. Depression   4. Bipolar affective disorder  - buPROPion (WELLBUTRIN XL) 300 MG 24 hr tablet; Take 1 tablet daily  for depression  Dispense: 90 tablet; Refill: 99 - FLUoxetine (PROZAC) 40 MG capsule; Take 1 capsule daily for mood & Depression  Dispense: 90 capsule; Refill: 99   - 30 minutes was spent reviewing chart, face-to-face examining & counseling and evaluating patient and filling out FMLA forms and doing refills.

## 2014-07-31 ENCOUNTER — Other Ambulatory Visit: Payer: Self-pay | Admitting: *Deleted

## 2014-07-31 MED ORDER — ELETRIPTAN HYDROBROMIDE 20 MG PO TABS
20.0000 mg | ORAL_TABLET | ORAL | Status: DC | PRN
Start: 2014-07-31 — End: 2014-10-10

## 2014-08-09 ENCOUNTER — Encounter: Payer: Self-pay | Admitting: Internal Medicine

## 2014-08-09 ENCOUNTER — Ambulatory Visit: Payer: Managed Care, Other (non HMO) | Admitting: Internal Medicine

## 2014-08-09 VITALS — BP 116/82 | HR 76 | Temp 97.7°F | Resp 16 | Ht 67.0 in | Wt 183.6 lb

## 2014-08-09 DIAGNOSIS — R030 Elevated blood-pressure reading, without diagnosis of hypertension: Secondary | ICD-10-CM

## 2014-08-09 DIAGNOSIS — G43009 Migraine without aura, not intractable, without status migrainosus: Secondary | ICD-10-CM

## 2014-08-09 MED ORDER — FLUOXETINE HCL 20 MG PO CAPS: ORAL_CAPSULE | ORAL | Status: DC

## 2014-08-13 ENCOUNTER — Encounter: Payer: Self-pay | Admitting: Internal Medicine

## 2014-08-13 NOTE — Progress Notes (Signed)
Subjective:    Patient ID: Kimberly Ray, female    DOB: 1951/03/15, 63 y.o.   MRN: [id number]  HPI   This patient is a very nice 63 yo DFF with hx/o Labile HTN, HLD, ADD, Chronic a=Anxiety/Depression & ADD and Prediabetes who reports initial improvement increasing her Bupropion XL to 300 mg and Fluoxetine 40 mg & then began tapering her doses again to 1/2 doses QOD because of feelings of "overmedication". Medication Sig  . albuterol HFA inhaler Inhale 2 puffs  every 6  hours as needed  . buPROPion  XL) 300 MG 24 hr tab Take 1 tablet daily  for depression  . eletriptan (RELPAX) 20 MG tablet Take 1 tab as needed for migraine1 po at onset of HA  . fish oil 1000 MG capsule Take 1 g by mouth daily.  Marland Kitchen lamoTRIgine (LAMICTAL) 100 MG tablet Take 1 tablet (100 mg total) by mouth 2 (two) times daily.  . metFORMIN500 MG tablet Take 500 mg by mouth daily with breakfast.   . pantoprazole  40 MG tablet Take 1 tablet (40 mg total) by mouth daily.  Marland Kitchen VICODIN 5-300 MG TABS   . FLUoxetine 40 MG capsule Take 1 capsule daily for mood & Depression  . MAGNESIUM PO Take 250 mg by mouth daily.   Allergies  Allergen Reactions  . Erythromycin Other (See Comments)    TBS  . Ivp Dye [Iodinated Diagnostic Agents] Other (See Comments)    Hot flashes, itching scalp  . Penicillins Hives  . Clarithromycin Other (See Comments)    Itching ankles  . Brintellix [Vortioxetine]     Itching  . Macrodantin [Nitrofurantoin]    Past Medical History  Diagnosis Date  . Depression   . Migraines   . GERD (gastroesophageal reflux disease)   . Anemia   . Sinus congestion   . Memory loss   . Head trauma   . Hay fever   . OSA on CPAP   . RLS (restless legs syndrome)    Review of Systems  In addition to the HPI above,  No Fever-chills,  No Headache, No changes with Vision or hearing,  No problems swallowing food or Liquids,  No Chest pain or productive Cough or Shortness of Breath,  No Abdominal pain, No Nausea or  Vomitting, Bowel movements are regular,  No Blood in stool or Urine,  No dysuria,  No new skin rashes or bruises,  No new joints pains-aches,  No new weakness, tingling, numbness in any extremity,  No recent weight loss,  No polyuria, polydypsia or polyphagia,  No significant Mental Stressors.  A full 10 point Review of Systems was done, except as stated above, all other Review of Systems were negative     Objective:   Physical Exam  BP 116/82 Pulse 76  Temp 97.7 F  Resp 16  Ht 5\' 7"    Wt 183 lb 9.6 oz    BMI 28.75   HEENT - Eac's patent. TM's Nl. EOM's full. PERRLA. NasoOroPharynx clear. Neck - supple. Nl Thyroid. Carotids 2+ & No bruits, nodes, JVD Chest - Clear equal BS w/o Rales, rhonchi, wheezes. Cor - Nl HS. RRR w/o sig MGR. PP 1(+). No edema. Abd - No palpable organomegaly, masses or tenderness. BS nl. MS- FROM w/o deformities. Muscle power, tone and bulk Nl. Gait Nl. Neuro - No obvious Cr N abnormalities. Sensory, motor and Cerebellar functions appear Nl w/o focal abnormalities. Psyche - Mental status normal & appropriate.  No  delusions, ideations or obvious mood abnormalities.    Assessment & Plan:   1. Elevated blood pressure reading without diagnosis of hypertension   2. Migraine without aura and without status migrainosus, not intractable  - Discussed tapering med dosing to Fluoxetine to QOD & maintaining Bupropion 300 XL  at full std dose of 300 mg/daily - ROV 3 mo for CPE or prn

## 2014-08-13 NOTE — Progress Notes (Deleted)
Patient ID: Kimberly Ray, female   DOB: 11-12-1950, 63 y.o.   MRN: [id number]   This very nice 63 y.o. DWF presents for 3 month follow up with Hypertension, Hyperlipidemia, T2_NIDDM and Vitamin D Deficiency.    Patient is treated for HTN & BP has been controlled at home. Today's BP: 116/82 mmHg. Patient has had no complaints of any cardiac type chest pain, palpitations, dyspnea/orthopnea/PND, dizziness, claudication, or dependent edema.  Hyperlipidemia is controlled with diet & meds. Patient denies myalgias or other med SE's. Last Lipids were not at goal - Total Chol  219; HDL 53; LDL 126; & with elevated Trig 202 on 02/28/2014.   Also, the patient has history of T2_NIDDM  and has had no symptoms of reactive hypoglycemia, diabetic polys, paresthesias or visual blurring.  Last A1c was  6.0% on  05/09/2014.   Further, the patient also has history of Vitamin D Deficiency and supplements vitamin D without any suspected side-effects. Last vitamin D was  53 on  05/09/2014.   Medication List   buPROPion 300 MG 24 hr tablet  Commonly known as:  WELLBUTRIN XL  Take 1 tablet daily  for depression     eletriptan 20 MG tablet  Commonly known as:  RELPAX  Take 1 tablet (20 mg total) by mouth as needed for migraine or headache. 1 po at onset of headache. May repeat in 2 hours if headache persists or recurs.     fish oil-omega-3 fatty acids 1000 MG capsule  Take 1 g by mouth daily.     FLUoxetine 20 MG capsule  Commonly known as:  PROZAC  Take 1 to 2 capsules daily as directed for mood     FLUVIRIN Susp  Generic drug:  Influenza Vac Typ A&B Surf Ant     lamoTRIgine 100 MG tablet  Commonly known as:  LAMICTAL  Take 1 tablet (100 mg total) by mouth 2 (two) times daily.     metFORMIN 500 MG tablet  Commonly known as:  GLUCOPHAGE  Take 500 mg by mouth daily with breakfast.     pantoprazole 40 MG tablet  Commonly known as:  PROTONIX  Take 1 tablet (40 mg total) by mouth daily.     VICODIN 5-300  MG Tabs  Generic drug:  Hydrocodone-Acetaminophen     Allergies  Allergen Reactions  . Erythromycin Other (See Comments)    TBS  . Ivp Dye [Iodinated Diagnostic Agents] Other (See Comments)    Hot flashes, itching scalp  . Penicillins Hives  . Clarithromycin Other (See Comments)    Itching ankles  . Brintellix [Vortioxetine]     Itching  . Macrodantin [Nitrofurantoin]     PMHx:   Past Medical History  Diagnosis Date  . Depression   . Migraines   . GERD (gastroesophageal reflux disease)   . Anemia   . Sinus congestion   . Memory loss   . Head trauma   . Hay fever   . OSA on CPAP   . RLS (restless legs syndrome)    Immunization History  Administered Date(s) Administered  . PPD Test 08/19/2011, 08/30/2013  . Pneumococcal Conjugate-13 08/18/2009  . Td 08/19/2003  . Tdap 08/30/2013  . Zoster 08/18/2010   Past Surgical History  Procedure Laterality Date  . Perforated eardrum      left side  . Nasal sinus surgery    . Tonsillectomy and adenoidectomy    . Kidney stone removed     FHx:    Reviewed /  unchanged  SHx:    Reviewed / unchanged  Systems Review:  Constitutional: Denies fever, chills, wt changes, headaches, insomnia, fatigue, night sweats, change in appetite. Eyes: Denies redness, blurred vision, diplopia, discharge, itchy, watery eyes.  ENT: Denies discharge, congestion, post nasal drip, epistaxis, sore throat, earache, hearing loss, dental pain, tinnitus, vertigo, sinus pain, snoring.  CV: Denies chest pain, palpitations, irregular heartbeat, syncope, dyspnea, diaphoresis, orthopnea, PND, claudication or edema. Respiratory: denies cough, dyspnea, DOE, pleurisy, hoarseness, laryngitis, wheezing.  Gastrointestinal: Denies dysphagia, odynophagia, heartburn, reflux, water brash, abdominal pain or cramps, nausea, vomiting, bloating, diarrhea, constipation, hematemesis, melena, hematochezia  or hemorrhoids. Genitourinary: Denies dysuria, frequency, urgency,  nocturia, hesitancy, discharge, hematuria or flank pain. Musculoskeletal: Denies arthralgias, myalgias, stiffness, jt. swelling, pain, limping or strain/sprain.  Skin: Denies pruritus, rash, hives, warts, acne, eczema or change in skin lesion(s). Neuro: No weakness, tremor, incoordination, spasms, paresthesia or pain. Psychiatric: Denies confusion, memory loss or sensory loss. Endo: Denies change in weight, skin or hair change.  Heme/Lymph: No excessive bleeding, bruising or enlarged lymph nodes.  Physical Exam  BP 116/82  Pulse 76  Temp 97.7 F   Resp 16  Ht 5\' 7"   Wt 183 lb 9.6 oz    BMI 28.75  Appears well nourished and in no distress.  Eyes: PERRLA, EOMs, conjunctiva no swelling or erythema. Sinuses: No frontal/maxillary tenderness ENT/Mouth: EAC's clear, TM's nl w/o erythema, bulging. Nares clear w/o erythema, swelling, exudates. Oropharynx clear without erythema or exudates. Oral hygiene is good. Tongue normal, non obstructing. Hearing intact.  Neck: Supple. Thyroid nl. Car 2+/2+ without bruits, nodes or JVD. Chest: Respirations nl with BS clear & equal w/o rales, rhonchi, wheezing or stridor.  Cor: Heart sounds normal w/ regular rate and rhythm without sig. murmurs, gallops, clicks, or rubs. Peripheral pulses normal and equal  without edema.  Abdomen: Soft & bowel sounds normal. Non-tender w/o guarding, rebound, hernias, masses, or organomegaly.  Lymphatics: Unremarkable.  Musculoskeletal: Full ROM all peripheral extremities, joint stability, 5/5 strength, and normal gait.  Skin: Warm, dry without exposed rashes, lesions or ecchymosis apparent.  Neuro: Cranial nerves intact, reflexes equal bilaterally. Sensory-motor testing grossly intact. Tendon reflexes grossly intact.  Pysch: Alert & oriented x 3.  Insight and judgement nl & appropriate. No ideations.  Assessment and Plan:  1. Hypertension - Continue monitor blood pressure at home. Continue diet/meds same.  2.  Hyperlipidemia - Continue diet/meds, exercise,& lifestyle modifications. Continue monitor periodic cholesterol/liver & renal functions   3. T2_NIDDM - Continue diet, exercise, lifestyle modifications. Monitor appropriate labs.  4. Vitamin D Deficiency - Continue supplementation.   Recommended regular exercise, BP monitoring, weight control, and discussed med and SE's. Recommended labs to assess and monitor clinical status. Further disposition pending results of labs.

## 2014-08-30 ENCOUNTER — Encounter: Payer: Self-pay | Admitting: Emergency Medicine

## 2014-09-12 ENCOUNTER — Other Ambulatory Visit: Payer: Self-pay

## 2014-09-12 DIAGNOSIS — Z1231 Encounter for screening mammogram for malignant neoplasm of breast: Secondary | ICD-10-CM

## 2014-09-13 ENCOUNTER — Ambulatory Visit
Admission: RE | Admit: 2014-09-13 | Discharge: 2014-09-13 | Disposition: A | Discharge status: Home / Self Care | Payer: Managed Care, Other (non HMO) | Source: Ambulatory Visit

## 2014-09-13 ENCOUNTER — Other Ambulatory Visit: Payer: Self-pay

## 2014-09-13 DIAGNOSIS — Z1231 Encounter for screening mammogram for malignant neoplasm of breast: Secondary | ICD-10-CM

## 2014-10-10 ENCOUNTER — Ambulatory Visit
Admission: RE | Admit: 2014-10-10 | Discharge: 2014-10-10 | Disposition: A | Discharge status: Home / Self Care | Payer: Managed Care, Other (non HMO) | Source: Ambulatory Visit | Attending: Emergency Medicine | Admitting: Emergency Medicine

## 2014-10-10 ENCOUNTER — Encounter: Payer: Self-pay | Admitting: Emergency Medicine

## 2014-10-10 ENCOUNTER — Ambulatory Visit (INDEPENDENT_AMBULATORY_CARE_PROVIDER_SITE_OTHER): Payer: Managed Care, Other (non HMO) | Admitting: Emergency Medicine

## 2014-10-10 VITALS — BP 130/80 | HR 96 | Temp 98.0°F | Resp 18 | Ht 67.0 in | Wt 185.0 lb

## 2014-10-10 DIAGNOSIS — K2100 Gastro-esophageal reflux disease with esophagitis, without bleeding: Secondary | ICD-10-CM

## 2014-10-10 DIAGNOSIS — R5383 Other fatigue: Secondary | ICD-10-CM

## 2014-10-10 DIAGNOSIS — F319 Bipolar disorder, unspecified: Secondary | ICD-10-CM

## 2014-10-10 DIAGNOSIS — R05 Cough: Secondary | ICD-10-CM

## 2014-10-10 DIAGNOSIS — R5381 Other malaise: Secondary | ICD-10-CM

## 2014-10-10 DIAGNOSIS — R059 Cough, unspecified: Secondary | ICD-10-CM

## 2014-10-10 LAB — CBC WITH DIFFERENTIAL/PLATELET
BASOS ABS: 0 10*3/uL (ref 0.0–0.1)
Basophils Relative: 0 % (ref 0–1)
EOS ABS: 0.1 10*3/uL (ref 0.0–0.7)
EOS PCT: 1 % (ref 0–5)
HCT: 41 % (ref 36.0–46.0)
HEMOGLOBIN: 13.4 g/dL (ref 12.0–15.0)
LYMPHS ABS: 1.7 10*3/uL (ref 0.7–4.0)
Lymphocytes Relative: 23 % (ref 12–46)
MCH: 27.2 pg (ref 26.0–34.0)
MCHC: 32.7 g/dL (ref 30.0–36.0)
MCV: 83.2 fL (ref 78.0–100.0)
MPV: 9.2 fL (ref 8.6–12.4)
Monocytes Absolute: 0.7 10*3/uL (ref 0.1–1.0)
Monocytes Relative: 9 % (ref 3–12)
Neutro Abs: 5.1 10*3/uL (ref 1.7–7.7)
Neutrophils Relative %: 67 % (ref 43–77)
Platelets: 310 10*3/uL (ref 150–400)
RBC: 4.93 MIL/uL (ref 3.87–5.11)
RDW: 14.9 % (ref 11.5–15.5)
WBC: 7.6 10*3/uL (ref 4.0–10.5)

## 2014-10-10 LAB — BASIC METABOLIC PANEL WITH GFR
BUN: 18 mg/dL (ref 6–23)
CHLORIDE: 101 meq/L (ref 96–112)
CO2: 26 meq/L (ref 19–32)
Calcium: 10.1 mg/dL (ref 8.4–10.5)
Creat: 0.81 mg/dL (ref 0.50–1.10)
GFR, EST AFRICAN AMERICAN: 89 mL/min
GFR, Est Non African American: 77 mL/min
Glucose, Bld: 119 mg/dL — ABNORMAL HIGH (ref 70–99)
POTASSIUM: 4.2 meq/L (ref 3.5–5.3)
Sodium: 139 mEq/L (ref 135–145)

## 2014-10-10 MED ORDER — METFORMIN HCL 500 MG PO TABS: 500.0000 mg | ORAL_TABLET | Freq: Every day | ORAL | Status: DC

## 2014-10-10 MED ORDER — PANTOPRAZOLE SODIUM 40 MG PO TBEC: 40.0000 mg | DELAYED_RELEASE_TABLET | Freq: Every day | ORAL | Status: DC

## 2014-10-10 MED ORDER — LAMOTRIGINE 100 MG PO TABS: 100.0000 mg | ORAL_TABLET | Freq: Two times a day (BID) | ORAL | Status: DC

## 2014-10-10 MED ORDER — BUPROPION HCL ER (XL) 300 MG PO TB24: ORAL_TABLET | ORAL | Status: DC

## 2014-10-10 MED ORDER — ELETRIPTAN HYDROBROMIDE 20 MG PO TABS: 20.0000 mg | ORAL_TABLET | ORAL | Status: DC | PRN

## 2014-10-10 MED ORDER — METHYLPHENIDATE HCL ER (CD) 10 MG PO CPCR: 10.0000 mg | ORAL_CAPSULE | Freq: Three times a day (TID) | ORAL | Status: DC | PRN

## 2014-10-10 MED ORDER — FLUOXETINE HCL 20 MG PO TABS: 20.0000 mg | ORAL_TABLET | Freq: Two times a day (BID) | ORAL | Status: DC

## 2014-10-10 NOTE — Progress Notes (Signed)
Subjective:     Patient ID: Kimberly Ray, female   DOB: 09-07-1950, 64 y.o.   MRN: [id number]  HPI Comments: 64 yo Patient presents with c/o increasing fatigue, occasional cough, productive thick white mucus.  Symptoms have been intermittent times 2 months with multiple herbals. She has had increased reflux but has not followed up with GI. She is clearing throat more and staying hoarse.   She has been on Lamictal/ Prozac/ ritalin/ Wellbutrin since winter 2015 and has felt a little better but had to decrease Prozac 20 mg QOD alternating 40 mg QOD. She notes some days are better than others but still has occasional fatigue and decreased mood.   She also needs multiple medications refilled.   Cough     Medication List       This list is accurate as of: 10/10/14 11:59 AM.  Always use your most recent med list.               albuterol 108 (90 BASE) MCG/ACT inhaler  Commonly known as:  PROVENTIL HFA;VENTOLIN HFA  Inhale 2 puffs into the lungs every 6 (six) hours as needed for wheezing or shortness of breath.     buPROPion 300 MG 24 hr tablet  Commonly known as:  WELLBUTRIN XL  Take 1 tablet daily  for depression     eletriptan 20 MG tablet  Commonly known as:  RELPAX  Take 1 tablet (20 mg total) by mouth as needed for migraine or headache. 1 po at onset of headache. May repeat in 2 hours if headache persists or recurs.     fish oil-omega-3 fatty acids 1000 MG capsule  Take 1 g by mouth daily.     FLUoxetine 20 MG tablet  Commonly known as:  PROZAC  Take 1 tablet (20 mg total) by mouth 2 (two) times daily.     FLUVIRIN Susp  Generic drug:  Influenza Vac Typ A&B Surf Ant     lamoTRIgine 100 MG tablet  Commonly known as:  LAMICTAL  Take 1 tablet (100 mg total) by mouth 2 (two) times daily.     metFORMIN 500 MG tablet  Commonly known as:  GLUCOPHAGE  Take 1 tablet (500 mg total) by mouth daily with breakfast.     methylphenidate 10 MG CR capsule  Commonly known as:   METADATE CD  Take 1 capsule (10 mg total) by mouth 3 (three) times daily as needed.     pantoprazole 40 MG tablet  Commonly known as:  PROTONIX  Take 1 tablet (40 mg total) by mouth daily.       Allergies  Allergen Reactions  . Erythromycin Other (See Comments)    TBS  . Ivp Dye [Iodinated Diagnostic Agents] Other (See Comments)    Hot flashes, itching scalp  . Penicillins Hives  . Clarithromycin Other (See Comments)    Itching ankles  . Brintellix [Vortioxetine]     Itching  . Macrodantin [Nitrofurantoin]    Past Medical History  Diagnosis Date  . Depression   . Migraines   . GERD (gastroesophageal reflux disease)   . Anemia   . Sinus congestion   . Memory loss   . Head trauma   . Hay fever   . OSA on CPAP   . RLS (restless legs syndrome)      Review of Systems  Constitutional: Positive for fatigue.  Respiratory: Positive for cough.   Gastrointestinal:       Reflux  All other systems  reviewed and are negative.  BP 130/80 mmHg  Pulse 96  Temp(Src) 98 F (36.7 C) (Temporal)  Resp 18  Ht 5\' 7"  (1.702 m)  Wt 185 lb (83.915 kg)  BMI 28.97 kg/m2     Objective:   Physical Exam  Constitutional: She is oriented to person, place, and time. She appears well-developed and well-nourished.  HENT:  Left Ear: External ear normal.  Nose: Nose normal.  Mouth/Throat: Oropharynx is clear and moist.  Eyes: Conjunctivae and EOM are normal.  Neck: Normal range of motion. Neck supple. No JVD present. No thyromegaly present.  Cardiovascular: Normal rate, regular rhythm, normal heart sounds and intact distal pulses.   Pulmonary/Chest: Effort normal and breath sounds normal.  Abdominal: Soft. Bowel sounds are normal. She exhibits no distension. There is no tenderness. There is no rebound.  Musculoskeletal: Normal range of motion. She exhibits no edema or tenderness.  Lymphadenopathy:    She has no cervical adenopathy.  Neurological: She is alert and oriented to person,  place, and time.  Skin: Skin is warm and dry. No rash noted.  Psychiatric: She has a normal mood and affect. Her behavior is normal. Judgment and thought content normal.  Nursing note and vitals reviewed.      Assessment:     Bipolar affective disorder, most recent episode unspecified type, remission status unspecified - Plan: buPROPion (WELLBUTRIN XL) 300 MG 24 hr tablet  Cough - Plan: CBC with Differential/Platelet, DG Chest 2 View  Malaise and fatigue - Plan: BASIC METABOLIC PANEL WITH GFR, CBC with Differential/Platelet, DG Chest 2 View  Gastroesophageal reflux disease with esophagitis - Plan: BASIC METABOLIC PANEL WITH GFR, CBC with Differential/Platelet, DG Chest 2 View, Ambulatory referral to Gastroenterology       Plan:     1. Bipolar/ ADD- Controlled refill RX AD.   2. COUGH vs GERD-Increase Protonix to BID, NEW Referral to [facility] scheduled for EGD and further evaluation  3. Fatigue- check labs, increase activity and H2O may be in relation to mood vs medicine

## 2014-10-10 NOTE — Patient Instructions (Signed)
Gastroesophageal Reflux Disease, Adult Gastroesophageal reflux disease (GERD) happens when acid from your stomach flows up into the esophagus. When acid comes in contact with the esophagus, the acid causes soreness (inflammation) in the esophagus. Over time, GERD may create small holes (ulcers) in the lining of the esophagus. CAUSES   Increased body weight. This puts pressure on the stomach, making acid rise from the stomach into the esophagus.  Smoking. This increases acid production in the stomach.  Drinking alcohol. This causes decreased pressure in the lower esophageal sphincter (valve or ring of muscle between the esophagus and stomach), allowing acid from the stomach into the esophagus.  Late evening meals and a full stomach. This increases pressure and acid production in the stomach.  A malformed lower esophageal sphincter. Sometimes, no cause is found. SYMPTOMS   Burning pain in the lower part of the mid-chest behind the breastbone and in the mid-stomach area. This may occur twice a week or more often.  Trouble swallowing.  Sore throat.  Dry cough.  Asthma-like symptoms including chest tightness, shortness of breath, or wheezing. DIAGNOSIS  Your caregiver may be able to diagnose GERD based on your symptoms. In some cases, X-rays and other tests may be done to check for complications or to check the condition of your stomach and esophagus. TREATMENT  Your caregiver may recommend over-the-counter or prescription medicines to help decrease acid production. Ask your caregiver before starting or adding any new medicines.  HOME CARE INSTRUCTIONS   Change the factors that you can control. Ask your caregiver for guidance concerning weight loss, quitting smoking, and alcohol consumption.  Avoid foods and drinks that make your symptoms worse, such as:  Caffeine or alcoholic drinks.  Chocolate.  Peppermint or mint flavorings.  Garlic and onions.  Spicy foods.  Citrus fruits,  such as oranges, lemons, or limes.  Tomato-based foods such as sauce, chili, salsa, and pizza.  Fried and fatty foods.  Avoid lying down for the 3 hours prior to your bedtime or prior to taking a nap.  Eat small, frequent meals instead of large meals.  Wear loose-fitting clothing. Do not wear anything tight around your waist that causes pressure on your stomach.  Raise the head of your bed 6 to 8 inches with wood blocks to help you sleep. Extra pillows will not help.  Only take over-the-counter or prescription medicines for pain, discomfort, or fever as directed by your caregiver.  Do not take aspirin, ibuprofen, or other nonsteroidal anti-inflammatory drugs (NSAIDs). SEEK IMMEDIATE MEDICAL CARE IF:   You have pain in your arms, neck, jaw, teeth, or back.  Your pain increases or changes in intensity or duration.  You develop nausea, vomiting, or sweating (diaphoresis).  You develop shortness of breath, or you faint.  Your vomit is green, yellow, black, or looks like coffee grounds or blood.  Your stool is red, bloody, or black. These symptoms could be signs of other problems, such as heart disease, gastric bleeding, or esophageal bleeding. MAKE SURE YOU:   Understand these instructions.  Will watch your condition.  Will get help right away if you are not doing well or get worse. Document Released: 05/14/2005 Document Revised: 10/27/2011 Document Reviewed: 02/21/2011 [employer] Patient Information 2015 [employer]. This information is not intended to replace advice given to you by your health care provider. Make sure you discuss any questions you have with your health care provider.  

## 2014-10-25 ENCOUNTER — Telehealth: Payer: Self-pay | Admitting: *Deleted

## 2014-10-25 NOTE — Telephone Encounter (Signed)
I called patient to schedule appointment with GI from [facility].  Patient declines going back to see Dr. Hilarie Fredrickson, I explained to her that leaving a GI doctor requires her to get her records and see if another will accept her.  She says she knows this already and has already gotten her records from Dr. Hilarie Fredrickson.  Patient states she wants to see a GI doc in [place] but she does not know the name of the practice or doctor.  States she will call back with that info soon to get her scheduled.

## 2014-11-02 ENCOUNTER — Encounter: Payer: Self-pay | Admitting: Internal Medicine

## 2014-11-09 ENCOUNTER — Encounter: Payer: Self-pay | Admitting: Internal Medicine

## 2014-11-09 ENCOUNTER — Ambulatory Visit (INDEPENDENT_AMBULATORY_CARE_PROVIDER_SITE_OTHER): Payer: Managed Care, Other (non HMO) | Admitting: Internal Medicine

## 2014-11-09 VITALS — BP 124/80 | HR 80 | Temp 97.7°F | Resp 16 | Ht 66.5 in | Wt 184.0 lb

## 2014-11-09 DIAGNOSIS — K219 Gastro-esophageal reflux disease without esophagitis: Secondary | ICD-10-CM

## 2014-11-09 DIAGNOSIS — Z79899 Other long term (current) drug therapy: Secondary | ICD-10-CM

## 2014-11-09 DIAGNOSIS — R7309 Other abnormal glucose: Secondary | ICD-10-CM

## 2014-11-09 DIAGNOSIS — E782 Mixed hyperlipidemia: Secondary | ICD-10-CM

## 2014-11-09 DIAGNOSIS — Z1212 Encounter for screening for malignant neoplasm of rectum: Secondary | ICD-10-CM

## 2014-11-09 DIAGNOSIS — E559 Vitamin D deficiency, unspecified: Secondary | ICD-10-CM

## 2014-11-09 DIAGNOSIS — Z111 Encounter for screening for respiratory tuberculosis: Secondary | ICD-10-CM

## 2014-11-09 DIAGNOSIS — G4733 Obstructive sleep apnea (adult) (pediatric): Secondary | ICD-10-CM

## 2014-11-09 DIAGNOSIS — R5383 Other fatigue: Secondary | ICD-10-CM

## 2014-11-09 DIAGNOSIS — J45909 Unspecified asthma, uncomplicated: Secondary | ICD-10-CM

## 2014-11-09 DIAGNOSIS — R030 Elevated blood-pressure reading, without diagnosis of hypertension: Secondary | ICD-10-CM

## 2014-11-09 DIAGNOSIS — F319 Bipolar disorder, unspecified: Secondary | ICD-10-CM

## 2014-11-09 DIAGNOSIS — R7303 Prediabetes: Secondary | ICD-10-CM

## 2014-11-09 DIAGNOSIS — IMO0001 Reserved for inherently not codable concepts without codable children: Secondary | ICD-10-CM

## 2014-11-09 DIAGNOSIS — Z9989 Dependence on other enabling machines and devices: Secondary | ICD-10-CM

## 2014-11-09 DIAGNOSIS — G2581 Restless legs syndrome: Secondary | ICD-10-CM

## 2014-11-09 DIAGNOSIS — F3130 Bipolar disorder, current episode depressed, mild or moderate severity, unspecified: Secondary | ICD-10-CM

## 2014-11-09 HISTORY — DX: Unspecified asthma, uncomplicated: J45.909

## 2014-11-09 LAB — BASIC METABOLIC PANEL WITH GFR
BUN: 23 mg/dL (ref 6–23)
CO2: 26 mEq/L (ref 19–32)
Calcium: 9.9 mg/dL (ref 8.4–10.5)
Chloride: 99 mEq/L (ref 96–112)
Creat: 0.73 mg/dL (ref 0.50–1.10)
GFR, Est Non African American: 87 mL/min
GLUCOSE: 79 mg/dL (ref 70–99)
POTASSIUM: 4.6 meq/L (ref 3.5–5.3)
Sodium: 137 mEq/L (ref 135–145)

## 2014-11-09 LAB — CBC WITH DIFFERENTIAL/PLATELET
Basophils Absolute: 0 10*3/uL (ref 0.0–0.1)
Basophils Relative: 0 % (ref 0–1)
EOS PCT: 1 % (ref 0–5)
Eosinophils Absolute: 0.1 10*3/uL (ref 0.0–0.7)
HCT: 38.7 % (ref 36.0–46.0)
HEMOGLOBIN: 12.6 g/dL (ref 12.0–15.0)
LYMPHS PCT: 23 % (ref 12–46)
Lymphs Abs: 2.2 10*3/uL (ref 0.7–4.0)
MCH: 27.5 pg (ref 26.0–34.0)
MCHC: 32.6 g/dL (ref 30.0–36.0)
MCV: 84.5 fL (ref 78.0–100.0)
MONO ABS: 0.7 10*3/uL (ref 0.1–1.0)
MPV: 9.5 fL (ref 8.6–12.4)
Monocytes Relative: 7 % (ref 3–12)
NEUTROS ABS: 6.6 10*3/uL (ref 1.7–7.7)
Neutrophils Relative %: 69 % (ref 43–77)
Platelets: 331 10*3/uL (ref 150–400)
RBC: 4.58 MIL/uL (ref 3.87–5.11)
RDW: 15 % (ref 11.5–15.5)
WBC: 9.5 10*3/uL (ref 4.0–10.5)

## 2014-11-09 LAB — LIPID PANEL
Cholesterol: 197 mg/dL (ref 0–200)
HDL: 47 mg/dL (ref >=46)
LDL Cholesterol: 106 mg/dL — ABNORMAL HIGH (ref 0–99)
TRIGLYCERIDES: 218 mg/dL — AB (ref <150)
Total CHOL/HDL Ratio: 4.2 Ratio
VLDL: 44 mg/dL — ABNORMAL HIGH (ref 0–40)

## 2014-11-09 LAB — HEMOGLOBIN A1C
HEMOGLOBIN A1C: 5.9 % — AB (ref <5.7)
MEAN PLASMA GLUCOSE: 123 mg/dL — AB (ref <117)

## 2014-11-09 LAB — HEPATIC FUNCTION PANEL
ALBUMIN: 4.6 g/dL (ref 3.5–5.2)
ALT: 58 U/L — AB (ref 0–35)
AST: 72 U/L — AB (ref 0–37)
Alkaline Phosphatase: 71 U/L (ref 39–117)
Bilirubin, Direct: 0.1 mg/dL (ref 0.0–0.3)
Indirect Bilirubin: 0.4 mg/dL (ref 0.2–1.2)
Total Bilirubin: 0.5 mg/dL (ref 0.2–1.2)
Total Protein: 6.9 g/dL (ref 6.0–8.3)

## 2014-11-09 LAB — IRON AND TIBC
%SAT: 10 % — AB (ref 20–55)
Iron: 47 ug/dL (ref 42–145)
TIBC: 450 ug/dL (ref 250–470)
UIBC: 403 ug/dL — ABNORMAL HIGH (ref 125–400)

## 2014-11-09 LAB — MAGNESIUM: Magnesium: 1.8 mg/dL (ref 1.5–2.5)

## 2014-11-09 MED ORDER — RANITIDINE HCL 300 MG PO TABS: ORAL_TABLET | ORAL | Status: DC

## 2014-11-09 NOTE — Patient Instructions (Signed)
Recommend the book "The END of DIETING" by Dr Excell Seltzer   & the book "The END of DIABETES " by Dr Excell Seltzer  At [email or web address] - get book & Audio CD's      Being diabetic has a  300% increased risk for heart attack, stroke, cancer, and alzheimer- type vascular dementia. It is very important that you work harder with diet by avoiding all foods that are white. Avoid white rice (brown & wild rice is OK), white potatoes (sweetpotatoes in moderation is OK), White bread or wheat bread or anything made out of white flour like bagels, donuts, rolls, buns, biscuits, cakes, pastries, cookies, pizza crust, and pasta (made from white flour & egg whites) - vegetarian pasta or spinach or wheat pasta is OK. Multigrain breads like Arnold's or [employer], or multigrain sandwich thins or flatbreads.  Diet, exercise and weight loss can reverse and cure diabetes in the early stages.  Diet, exercise and weight loss is very important in the control and prevention of complications of diabetes which affects every system in your body, ie. Brain - dementia/stroke, eyes - glaucoma/blindness, heart - heart attack/heart failure, kidneys - dialysis, stomach - gastric paralysis, intestines - malabsorption, nerves - severe painful neuritis, circulation - gangrene & loss of a leg(s), and finally cancer and Alzheimers.    I recommend avoid fried & greasy foods,  sweets/candy, white rice (brown or wild rice or Quinoa is OK), white potatoes (sweet potatoes are OK) - anything made from white flour - bagels, doughnuts, rolls, buns, biscuits,white and wheat breads, pizza crust and traditional pasta made of white flour & egg white(vegetarian pasta or spinach or wheat pasta is OK).  Multi-grain bread is OK - like multi-grain flat bread or sandwich thins. Avoid alcohol in excess. Exercise is also important.    Eat all the vegetables you want - avoid meat, especially red meat and dairy - especially cheese.  Cheese is the most  concentrated form of trans-fats which is the worst thing to clog up our arteries. Veggie cheese is OK which can be found in the fresh produce section at [employer] or [employer] or [employer]  Preventive Care for Adults A healthy lifestyle and preventive care can promote health and wellness. Preventive health guidelines for women include the following key practices.  A routine yearly physical is a good way to check with your health care provider about your health and preventive screening. It is a chance to share any concerns and updates on your health and to receive a thorough exam.  Visit your dentist for a routine exam and preventive care every 6 months. Brush your teeth twice a day and floss once a day. Good oral hygiene prevents tooth decay and gum disease.  The frequency of eye exams is based on your age, health, family medical history, use of contact lenses, and other factors. Follow your health care provider's recommendations for frequency of eye exams.  Eat a healthy diet. Foods like vegetables, fruits, whole grains, low-fat dairy products, and lean protein foods contain the nutrients you need without too many calories. Decrease your intake of foods high in solid fats, added sugars, and salt. Eat the right amount of calories for you.Get information about a proper diet from your health care provider, if necessary.  Regular physical exercise is one of the most important things you can do for your health. Most adults should get at least 150 minutes of moderate-intensity exercise (any activity that increases your heart rate and  causes you to sweat) each week. In addition, most adults need muscle-strengthening exercises on 2 or more days a week.  Maintain a healthy weight. The body mass index (BMI) is a screening tool to identify possible weight problems. It provides an estimate of body fat based on height and weight. Your health care provider can find your BMI and can help you achieve or  maintain a healthy weight.For adults 20 years and older:  A BMI below 18.5 is considered underweight.  A BMI of 18.5 to 24.9 is normal.  A BMI of 25 to 29.9 is considered overweight.  A BMI of 30 and above is considered obese.  Maintain normal blood lipids and cholesterol levels by exercising and minimizing your intake of saturated fat. Eat a balanced diet with plenty of fruit and vegetables. Blood tests for lipids and cholesterol should begin at age 57 and be repeated every 5 years. If your lipid or cholesterol levels are high, you are over 50, or you are at high risk for heart disease, you may need your cholesterol levels checked more frequently.Ongoing high lipid and cholesterol levels should be treated with medicines if diet and exercise are not working.  If you smoke, find out from your health care provider how to quit. If you do not use tobacco, do not start.  Lung cancer screening is recommended for adults aged 52-80 years who are at high risk for developing lung cancer because of a history of smoking. A yearly low-dose CT scan of the lungs is recommended for people who have at least a 30-pack-year history of smoking and are a current smoker or have quit within the past 15 years. A pack year of smoking is smoking an average of 1 pack of cigarettes a day for 1 year (for example: 1 pack a day for 30 years or 2 packs a day for 15 years). Yearly screening should continue until the smoker has stopped smoking for at least 15 years. Yearly screening should be stopped for people who develop a health problem that would prevent them from having lung cancer treatment.  High blood pressure causes heart disease and increases the risk of stroke. Your blood pressure should be checked at least every 1 to 2 years. Ongoing high blood pressure should be treated with medicines if weight loss and exercise do not work.  If you are 43-25 years old, ask your health care provider if you should take aspirin to  prevent strokes.  Diabetes screening involves taking a blood sample to check your fasting blood sugar level. This should be done once every 3 years, after age 36, if you are within normal weight and without risk factors for diabetes. Testing should be considered at a younger age or be carried out more frequently if you are overweight and have at least 1 risk factor for diabetes.  Breast cancer screening is essential preventive care for women. You should practice "breast self-awareness." This means understanding the normal appearance and feel of your breasts and may include breast self-examination. Any changes detected, no matter how small, should be reported to a health care provider. Women in their 73s and 30s should have a clinical breast exam (CBE) by a health care provider as part of a regular health exam every 1 to 3 years. After age 12, women should have a CBE every year. Starting at age 75, women should consider having a mammogram (breast X-ray test) every year. Women who have a family history of breast cancer should talk to  their health care provider about genetic screening. Women at a high risk of breast cancer should talk to their health care providers about having an MRI and a mammogram every year.  Breast cancer gene (BRCA)-related cancer risk assessment is recommended for women who have family members with BRCA-related cancers. BRCA-related cancers include breast, ovarian, tubal, and peritoneal cancers. Having family members with these cancers may be associated with an increased risk for harmful changes (mutations) in the breast cancer genes BRCA1 and BRCA2. Results of the assessment will determine the need for genetic counseling and BRCA1 and BRCA2 testing.  Routine pelvic exams to screen for cancer are no longer recommended for nonpregnant women who are considered low risk for cancer of the pelvic organs (ovaries, uterus, and vagina) and who do not have symptoms. Ask your health care provider  if a screening pelvic exam is right for you.  If you have had past treatment for cervical cancer or a condition that could lead to cancer, you need Pap tests and screening for cancer for at least 20 years after your treatment. If Pap tests have been discontinued, your risk factors (such as having a new sexual partner) need to be reassessed to determine if screening should be resumed. Some women have medical problems that increase the chance of getting cervical cancer. In these cases, your health care provider may recommend more frequent screening and Pap tests.  Colorectal cancer can be detected and often prevented. Most routine colorectal cancer screening begins at the age of 63 years and continues through age 41 years. However, your health care provider may recommend screening at an earlier age if you have risk factors for colon cancer. On a yearly basis, your health care provider may provide home test kits to check for hidden blood in the stool. Use of a small camera at the end of a tube, to directly examine the colon (sigmoidoscopy or colonoscopy), can detect the earliest forms of colorectal cancer. Talk to your health care provider about this at age 57, when routine screening begins. Direct exam of the colon should be repeated every 5-10 years through age 65 years, unless early forms of pre-cancerous polyps or small growths are found.  Hepatitis C blood testing is recommended for all people born from [birth year] through 1965 and any individual with known risks for hepatitis C.  Pra  Osteoporosis is a disease in which the bones lose minerals and strength with aging. This can result in serious bone fractures or breaks. The risk of osteoporosis can be identified using a bone density scan. Women ages 78 years and over and women at risk for fractures or osteoporosis should discuss screening with their health care providers. Ask your health care provider whether you should take a calcium supplement or vitamin D  to reduce the rate of osteoporosis.  Menopause can be associated with physical symptoms and risks. Hormone replacement therapy is available to decrease symptoms and risks. You should talk to your health care provider about whether hormone replacement therapy is right for you.  Use sunscreen. Apply sunscreen liberally and repeatedly throughout the day. You should seek shade when your shadow is shorter than you. Protect yourself by wearing long sleeves, pants, a wide-brimmed hat, and sunglasses year round, whenever you are outdoors.  Once a month, do a whole body skin exam, using a mirror to look at the skin on your back. Tell your health care provider of new moles, moles that have irregular borders, moles that are larger than a pencil  eraser, or moles that have changed in shape or color.  Stay current with required vaccines (immunizations).  Influenza vaccine. All adults should be immunized every year.  Tetanus, diphtheria, and acellular pertussis (Td, Tdap) vaccine. Pregnant women should receive 1 dose of Tdap vaccine during each pregnancy. The dose should be obtained regardless of the length of time since the last dose. Immunization is preferred during the 27th-36th week of gestation. An adult who has not previously received Tdap or who does not know her vaccine status should receive 1 dose of Tdap. This initial dose should be followed by tetanus and diphtheria toxoids (Td) booster doses every 10 years. Adults with an unknown or incomplete history of completing a 3-dose immunization series with Td-containing vaccines should begin or complete a primary immunization series including a Tdap dose. Adults should receive a Td booster every 10 years.  Varicella vaccine. An adult without evidence of immunity to varicella should receive 2 doses or a second dose if she has previously received 1 dose. Pregnant females who do not have evidence of immunity should receive the first dose after pregnancy. This first  dose should be obtained before leaving the health care facility. The second dose should be obtained 4-8 weeks after the first dose.  Human papillomavirus (HPV) vaccine. Females aged 13-26 years who have not received the vaccine previously should obtain the 3-dose series. The vaccine is not recommended for use in pregnant females. However, pregnancy testing is not needed before receiving a dose. If a female is found to be pregnant after receiving a dose, no treatment is needed. In that case, the remaining doses should be delayed until after the pregnancy. Immunization is recommended for any person with an immunocompromised condition through the age of 24 years if she did not get any or all doses earlier. During the 3-dose series, the second dose should be obtained 4-8 weeks after the first dose. The third dose should be obtained 24 weeks after the first dose and 16 weeks after the second dose.  Zoster vaccine. One dose is recommended for adults aged 52 years or older unless certain conditions are present.  Measles, mumps, and rubella (MMR) vaccine. Adults born before [birth year] generally are considered immune to measles and mumps. Adults born in [birth year] or later should have 1 or more doses of MMR vaccine unless there is a contraindication to the vaccine or there is laboratory evidence of immunity to each of the three diseases. A routine second dose of MMR vaccine should be obtained at least 28 days after the first dose for students attending postsecondary schools, health care workers, or international travelers. People who received inactivated measles vaccine or an unknown type of measles vaccine during 1963-1967 should receive 2 doses of MMR vaccine. People who received inactivated mumps vaccine or an unknown type of mumps vaccine before 1979 and are at high risk for mumps infection should consider immunization with 2 doses of MMR vaccine. For females of childbearing age, rubella immunity should be determined. If there  is no evidence of immunity, females who are not pregnant should be vaccinated. If there is no evidence of immunity, females who are pregnant should delay immunization until after pregnancy. Unvaccinated health care workers born before [birth year] who lack laboratory evidence of measles, mumps, or rubella immunity or laboratory confirmation of disease should consider measles and mumps immunization with 2 doses of MMR vaccine or rubella immunization with 1 dose of MMR vaccine.  Pneumococcal 13-valent conjugate (PCV13) vaccine. When indicated, a person who  is uncertain of her immunization history and has no record of immunization should receive the PCV13 vaccine. An adult aged 68 years or older who has certain medical conditions and has not been previously immunized should receive 1 dose of PCV13 vaccine. This PCV13 should be followed with a dose of pneumococcal polysaccharide (PPSV23) vaccine. The PPSV23 vaccine dose should be obtained at least 8 weeks after the dose of PCV13 vaccine. An adult aged 60 years or older who has certain medical conditions and previously received 1 or more doses of PPSV23 vaccine should receive 1 dose of PCV13. The PCV13 vaccine dose should be obtained 1 or more years after the last PPSV23 vaccine dose.    Pneumococcal polysaccharide (PPSV23) vaccine. When PCV13 is also indicated, PCV13 should be obtained first. All adults aged 42 years and older should be immunized. An adult younger than age 80 years who has certain medical conditions should be immunized. Any person who resides in a nursing home or long-term care facility should be immunized. An adult smoker should be immunized. People with an immunocompromised condition and certain other conditions should receive both PCV13 and PPSV23 vaccines. People with human immunodeficiency virus (HIV) infection should be immunized as soon as possible after diagnosis. Immunization during chemotherapy or radiation therapy should be avoided. Routine use  of PPSV23 vaccine is not recommended for American Indians, [place] Natives, or people younger than 65 years unless there are medical conditions that require PPSV23 vaccine. When indicated, people who have unknown immunization and have no record of immunization should receive PPSV23 vaccine. One-time revaccination 5 years after the first dose of PPSV23 is recommended for people aged 19-64 years who have chronic kidney failure, nephrotic syndrome, asplenia, or immunocompromised conditions. People who received 1-2 doses of PPSV23 before age 70 years should receive another dose of PPSV23 vaccine at age 68 years or later if at least 5 years have passed since the previous dose. Doses of PPSV23 are not needed for people immunized with PPSV23 at or after age 38 years.  Preventive Services / Frequency   Ages 9 to 79 years  Blood pressure check.  Lipid and cholesterol check.  Lung cancer screening. / Every year if you are aged 51-80 years and have a 30-pack-year history of smoking and currently smoke or have quit within the past 15 years. Yearly screening is stopped once you have quit smoking for at least 15 years or develop a health problem that would prevent you from having lung cancer treatment.  Clinical breast exam.** / Every year after age 32 years.  BRCA-related cancer risk assessment.** / For women who have family members with a BRCA-related cancer (breast, ovarian, tubal, or peritoneal cancers).  Mammogram.** / Every year beginning at age 21 years and continuing for as long as you are in good health. Consult with your health care provider.  Pap test.** / Every 3 years starting at age 56 years through age 72 or 3 years with a history of 3 consecutive normal Pap tests.  HPV screening.** / Every 3 years from ages 39 years through ages 22 to 61 years with a history of 3 consecutive normal Pap tests.  Fecal occult blood test (FOBT) of stool. / Every year beginning at age 18 years and continuing  until age 70 years. You may not need to do this test if you get a colonoscopy every 10 years.  Flexible sigmoidoscopy or colonoscopy.** / Every 5 years for a flexible sigmoidoscopy or every 10 years for a colonoscopy beginning  at age 78 years and continuing until age 65 years.  Hepatitis C blood test.** / For all people born from [birth year] through 1966 and any individual with known risks for hepatitis C.  Skin self-exam. / Monthly.  Influenza vaccine. / Every year.  Tetanus, diphtheria, and acellular pertussis (Tdap/Td) vaccine.** / Consult your health care provider. Pregnant women should receive 1 dose of Tdap vaccine during each pregnancy. 1 dose of Td every 10 years.  Varicella vaccine.** / Consult your health care provider. Pregnant females who do not have evidence of immunity should receive the first dose after pregnancy.  Zoster vaccine.** / 1 dose for adults aged 64 years or older.  Pneumococcal 13-valent conjugate (PCV13) vaccine.** / Consult your health care provider.  Pneumococcal polysaccharide (PPSV23) vaccine.** / 1 to 2 doses if you smoke cigarettes or if you have certain conditions.  Meningococcal vaccine.** / Consult your health care provider.  Hepatitis A vaccine.** / Consult your health care provider.  Hepatitis B vaccine.** / Consult your health care provider. Screening for abdominal aortic aneurysm (AAA)  by ultrasound is recommended for people over 50 who have history of high blood pressure or who are current or former smokers.

## 2014-11-09 NOTE — Progress Notes (Signed)
Patient ID: Kimberly Ray, female   DOB: 09-06-1950, 64 y.o.   MRN: [id number]  Annual Comprehensive Examination  This very nice 64 y.o. DWF presents for complete physical.  Patient has been followed for HTN, PreDM/Insulin Resistance/T2_DM, Hyperlipidemia, and Vitamin D Deficiency.    Patient has been monitored a number of years for labile elevated BP. Patient's BP has been controlled at home and patient denies any cardiac symptoms as chest pain, palpitations, shortness of breath, dizziness or ankle swelling. Today's BP: 124/80 mmHg    Patient's hyperlipidemia is not controlled with diet and medications. Patient denies myalgias or other medication SE's. Last lipids were not at goal - Total Chol 219; HDL 53; LDL 126; with elevated Trig 202 on 02/28/2014.   Patient has PreDm with Insulin resistance since 2014 and was finally started on Metformin to also help with weight control after she had several elevated insulin levels in the range 250-350 and patient denies reactive hypoglycemic symptoms, visual blurring, diabetic polys, or paresthesias. Last A1c was  6.0% on 9/22/201.   Finally, patient has history of Vitamin D Deficiency of  37 in 2012 and last Vitamin D was 53 on 05/09/2014.  Medication Sig  . albuterol (PROVENTIL HFA;VENTOLIN HFA) 108 (90 BASE) MCG/ACT inhaler Inhale 2 puffs into the lungs every 6 (six) hours as needed for wheezing or shortness of breath.  Marland Kitchen buPROPion (WELLBUTRIN XL) 300 MG 24 hr tablet Take 1 tablet daily  for depression  . eletriptan (RELPAX) 20 MG tablet Take 1 tablet (20 mg total) by mouth as needed for migraine or headache. 1 po at onset of headache. May repeat in 2 hours if headache persists or recurs.  Marland Kitchen FLUoxetine (PROZAC) 20 MG tablet Take 1 tablet (20 mg total) by mouth 2 (two) times daily.  Marland Kitchen FLUVIRIN SUSP   . lamoTRIgine (LAMICTAL) 100 MG tablet Take 1 tablet (100 mg total) by mouth 2 (two) times daily.  . metFORMIN (GLUCOPHAGE) 500 MG tablet Take 1 tablet (500  mg total) by mouth daily with breakfast.  . methylphenidate (METADATE CD) 10 MG CR capsule Take 1 capsule (10 mg total) by mouth 3 (three) times daily as needed.  . pantoprazole (PROTONIX) 40 MG tablet Take 1 tablet (40 mg total) by mouth daily.  . fish oil-omega-3 fatty acids 1000 MG capsule Take 1 g by mouth daily.   Allergies  Allergen Reactions  . Erythromycin Other (See Comments)    TBS  . Ivp Dye [Iodinated Diagnostic Agents] Other (See Comments)    Hot flashes, itching scalp  . Penicillins Hives  . Clarithromycin Other (See Comments)    Itching ankles  . Brintellix [Vortioxetine]     Itching  . Macrodantin [Nitrofurantoin]    Past Medical History  Diagnosis Date  . Depression   . Migraines   . GERD (gastroesophageal reflux disease)   . Anemia   . Sinus congestion   . Memory loss   . Head trauma   . Hay fever   . OSA on CPAP   . RLS (restless legs syndrome)    Health Maintenance  Topic Date Due  . HIV Screening  01/22/1965  . INFLUENZA VACCINE  03/18/2014  . MAMMOGRAM  09/13/2016  . COLONOSCOPY  10/12/2021  . TETANUS/TDAP  08/31/2023  . ZOSTAVAX  Completed   Immunization History  Administered Date(s) Administered  . PPD Test 08/19/2011, 08/30/2013  . Pneumococcal Conjugate-13 08/18/2009  . Td 08/19/2003  . Tdap 08/30/2013  . Zoster 08/18/2010   Past  Surgical History  Procedure Laterality Date  . Perforated eardrum      left side  . Nasal sinus surgery    . Tonsillectomy and adenoidectomy    . Kidney stone removed     Family History  Problem Relation Age of Onset  . Diabetes Mother   . Diabetes Sister   . Breast cancer Maternal Grandmother    History  Substance Use Topics  . Smoking status: Never Smoker   . Smokeless tobacco: Never Used  . Alcohol Use: 0.6 oz/week    1 Shots of liquor per week     Comment: Once a year    ROS Constitutional: Denies fever, chills, weight loss/gain, headaches, insomnia, fatigue, night sweats, and change in  appetite. Eyes: Denies redness, blurred vision, diplopia, discharge, itchy, watery eyes.  ENT: Denies discharge, congestion, post nasal drip, epistaxis, sore throat, earache, hearing loss, dental pain, Tinnitus, Vertigo, Sinus pain, snoring.  Cardio: Denies chest pain, palpitations, irregular heartbeat, syncope, dyspnea, diaphoresis, orthopnea, PND, claudication, edema Respiratory: denies cough, dyspnea, DOE, pleurisy, hoarseness, laryngitis, wheezing.  Gastrointestinal: Denies dysphagia, heartburn, reflux, water brash, pain, cramps, nausea, vomiting, bloating, diarrhea, constipation, hematemesis, melena, hematochezia, jaundice, hemorrhoids Genitourinary: Denies dysuria, frequency, urgency, nocturia, hesitancy, discharge, hematuria, flank pain Breast: Breast lumps, nipple discharge, bleeding.  Musculoskeletal: Denies arthralgia, myalgia, stiffness, Jt. Swelling, pain, limp, and strain/sprain. Denies falls. Skin: Denies puritis, rash, hives, warts, acne, eczema, changing in skin lesion Neuro: No weakness, tremor, incoordination, spasms, paresthesia, pain Psychiatric: Denies confusion, memory loss, sensory loss. Denies Depression. Endocrine: Denies change in weight, skin, hair change, nocturia, and paresthesia, diabetic polys, visual blurring, hyper / hypo glycemic episodes.  Heme/Lymph: No excessive bleeding, bruising, enlarged lymph nodes.  Physical Exam  BP 124/80   Pulse 80  Temp 97.7 F   Resp 16  Ht 5' 6.5"   Wt 184 lb     BMI 29.26   General Appearance: Well nourished and in no apparent distress. Eyes: PERRLA, EOMs, conjunctiva no swelling or erythema, normal fundi and vessels. Sinuses: No frontal/maxillary tenderness ENT/Mouth: EACs patent / TMs  nl. Nares clear without erythema, swelling, mucoid exudates. Oral hygiene is good. No erythema, swelling, or exudate. Tongue normal, non-obstructing. Tonsils not swollen or erythematous. Hearing normal.  Neck: Supple, thyroid normal. No  bruits, nodes or JVD. Respiratory: Respiratory effort normal.  BS equal and clear bilateral without rales, rhonci, wheezing or stridor. Cardio: Heart sounds are normal with regular rate and rhythm and no murmurs, rubs or gallops. Peripheral pulses are normal and equal bilaterally without edema. No aortic or femoral bruits. Chest: symmetric with normal excursions and percussion. Breasts: Symmetric, without lumps, nipple discharge, retractions, or fibrocystic changes.  Abdomen: Flat, soft, with bowl sounds. Nontender, no guarding, rebound, hernias, masses, or organomegaly.  Lymphatics: Non tender without lymphadenopathy.  Genitourinary:  Musculoskeletal: Full ROM all peripheral extremities, joint stability, 5/5 strength, and normal gait. Skin: Warm and dry without rashes, lesions, cyanosis, clubbing or  ecchymosis.  Neuro: Cranial nerves intact, reflexes equal bilaterally. Normal muscle tone, no cerebellar symptoms. Sensation intact.  Pysch: Awake and oriented X 3, normal affect, Insight and Judgment appropriate.   Assessment and Plan  1. Elevated BP  - Microalbumin / creatinine urine ratio - EKG 12-Lead - [name], RETROPERITNL ABD,  LTD  2. Mixed hyperlipidemia  - Lipid panel  3. Prediabetes / Insulin Resistance  - Hemoglobin A1c - Insulin, random  4. Vitamin D deficiency  - Vit D  25 hydroxy (rtn osteoporosis monitoring)  5.  Bipolar depression   6. RLS (restless legs syndrome)   7. OSA on CPAP   8. Screening for rectal cancer  - POC Hemoccult Bld/Stl (3-Cd Home Screen); Future  9. Gastroesophageal reflux disease, esophagitis presence not specified   10. Other fatigue  - Vitamin B12 - Iron and TIBC - TSH  11. Medication management  - Urine Microscopic - CBC with Differential/Platelet - BASIC METABOLIC PANEL WITH GFR - Hepatic function panel - Magnesium   Continue prudent diet as discussed, weight control, BP monitoring, regular exercise, and medications.  Discussed med's effects and SE's. Screening labs and tests as requested with regular follow-up as recommended. Over 40 minutes of exam, counseling, chart review was performed.

## 2014-11-10 ENCOUNTER — Encounter: Payer: Self-pay | Admitting: Internal Medicine

## 2014-11-10 LAB — MICROALBUMIN / CREATININE URINE RATIO
Creatinine, Urine: 100.6 mg/dL
Microalb Creat Ratio: 7 mg/g (ref 0.0–30.0)
Microalb, Ur: 0.7 mg/dL (ref <2.0)

## 2014-11-10 LAB — URINALYSIS, MICROSCOPIC ONLY
Bacteria, UA: NONE SEEN
CRYSTALS: NONE SEEN
Casts: NONE SEEN
SQUAMOUS EPITHELIAL / LPF: NONE SEEN

## 2014-11-10 LAB — VITAMIN D 25 HYDROXY (VIT D DEFICIENCY, FRACTURES): Vit D, 25-Hydroxy: 47 ng/mL (ref 30–100)

## 2014-11-10 LAB — TSH: TSH: 1.979 u[IU]/mL (ref 0.350–4.500)

## 2014-11-10 LAB — VITAMIN B12: Vitamin B-12: 578 pg/mL (ref 211–911)

## 2014-11-10 LAB — INSULIN, RANDOM: INSULIN: 23.6 u[IU]/mL — AB (ref 2.0–19.6)

## 2014-11-13 LAB — TB SKIN TEST
Induration: 0 mm
TB Skin Test: NEGATIVE

## 2014-11-15 ENCOUNTER — Other Ambulatory Visit: Payer: Self-pay

## 2014-11-15 DIAGNOSIS — K219 Gastro-esophageal reflux disease without esophagitis: Secondary | ICD-10-CM

## 2014-12-06 ENCOUNTER — Encounter: Payer: Self-pay | Admitting: Physician Assistant

## 2014-12-06 ENCOUNTER — Ambulatory Visit (INDEPENDENT_AMBULATORY_CARE_PROVIDER_SITE_OTHER): Payer: Managed Care, Other (non HMO) | Admitting: Physician Assistant

## 2014-12-06 VITALS — BP 102/62 | HR 84 | Temp 97.7°F | Resp 16 | Wt 181.0 lb

## 2014-12-06 DIAGNOSIS — G43009 Migraine without aura, not intractable, without status migrainosus: Secondary | ICD-10-CM

## 2014-12-06 DIAGNOSIS — F3130 Bipolar disorder, current episode depressed, mild or moderate severity, unspecified: Secondary | ICD-10-CM

## 2014-12-06 DIAGNOSIS — R748 Abnormal levels of other serum enzymes: Secondary | ICD-10-CM

## 2014-12-06 DIAGNOSIS — F319 Bipolar disorder, unspecified: Secondary | ICD-10-CM

## 2014-12-06 MED ORDER — FLUOXETINE HCL 20 MG PO TABS: 20.0000 mg | ORAL_TABLET | Freq: Two times a day (BID) | ORAL | Status: DC

## 2014-12-06 MED ORDER — METHYLPHENIDATE HCL ER (CD) 10 MG PO CPCR: 10.0000 mg | ORAL_CAPSULE | Freq: Three times a day (TID) | ORAL | Status: DC | PRN

## 2014-12-06 NOTE — Progress Notes (Signed)
Subjective:    Patient ID: Kimberly Ray, female    DOB: [birth year], 64 y.o.   MRN: [id number]  HPI 64 y.o. with history of migraine, depression, presents for FMLA paper work. She has had headaches since before 2001, diagnosed with migraines and treated with relpax, she will have disabling migraines up to 3-4 times a month, more right sided/occipital with sensitivity to light and sound with nausea. She will have to take her medications and lay down and is able to recover. She is also treated for depression with lamictal, wellbutrin and prozac which is fairly well controlled but she will occ have flares of her depression as well which cause her to be unable to work due the inability to concentrate.    Current Outpatient Prescriptions on File Prior to Visit  Medication Sig Dispense Refill  . albuterol (PROVENTIL HFA;VENTOLIN HFA) 108 (90 BASE) MCG/ACT inhaler Inhale 2 puffs into the lungs every 6 (six) hours as needed for wheezing or shortness of breath. 1 Inhaler 2  . buPROPion (WELLBUTRIN XL) 300 MG 24 hr tablet Take 1 tablet daily  for depression 90 tablet 1  . eletriptan (RELPAX) 20 MG tablet Take 1 tablet (20 mg total) by mouth as needed for migraine or headache. 1 po at onset of headache. May repeat in 2 hours if headache persists or recurs. 24 tablet 1  . FLUoxetine (PROZAC) 20 MG tablet Take 1 tablet (20 mg total) by mouth 2 (two) times daily. 180 tablet 0  . FLUVIRIN SUSP   0  . lamoTRIgine (LAMICTAL) 100 MG tablet Take 1 tablet (100 mg total) by mouth 2 (two) times daily. 180 tablet 1  . metFORMIN (GLUCOPHAGE) 500 MG tablet Take 1 tablet (500 mg total) by mouth daily with breakfast. 90 tablet 1  . methylphenidate (METADATE CD) 10 MG CR capsule Take 1 capsule (10 mg total) by mouth 3 (three) times daily as needed. 90 capsule 0  . ranitidine (ZANTAC) 300 MG tablet Take 1 tablet 2 x daily for acid indigestion & reflux 180 tablet 3   No current facility-administered medications on file  prior to visit.   Past Medical History  Diagnosis Date  . Depression   . Migraines   . GERD (gastroesophageal reflux disease)   . Anemia   . Sinus congestion   . Memory loss   . Head trauma   . Hay fever   . OSA on CPAP   . RLS (restless legs syndrome)    Review of Systems  Constitutional: Positive for fatigue. Negative for fever.  HENT: Negative for postnasal drip.   Gastrointestinal: Negative for abdominal pain.  Musculoskeletal: Positive for myalgias and arthralgias.  Neurological: Positive for headaches.       Objective:   Physical Exam  Constitutional: She is oriented to person, place, and time. She appears well-developed and well-nourished.  HENT:  Left Ear: External ear normal.  Nose: Nose normal.  Mouth/Throat: Oropharynx is clear and moist.  Eyes: Conjunctivae and EOM are normal.  Neck: Normal range of motion. Neck supple. No JVD present. No thyromegaly present.  Cardiovascular: Normal rate, regular rhythm, normal heart sounds and intact distal pulses.   Pulmonary/Chest: Effort normal and breath sounds normal.  Abdominal: Soft. Bowel sounds are normal. She exhibits no distension. There is no tenderness. There is no rebound.  Musculoskeletal: Normal range of motion. She exhibits no edema or tenderness.  Lymphadenopathy:    She has no cervical adenopathy.  Neurological: She is alert and oriented  to person, place, and time.  Skin: Skin is warm and dry. No rash noted.  Psychiatric: She has a normal mood and affect. Her behavior is normal. Judgment and thought content normal.  Nursing note and vitals reviewed.      Assessment & Plan:  Migraine/depression- will fill out FMLA papers for work, continue medications, continue regular follow ups and PRN for flares.   Elevated liver function- will recheck liver enzymes

## 2014-12-06 NOTE — Patient Instructions (Signed)
Liver enzymes are elevated. This could be from a virus like hepatitis, infection like mono, Alcohol, pain medications that contain tylenol, fat and/or cholesterol build up. Decrease alcohol, decrease tylenol, and we encourage weight loss.

## 2014-12-07 LAB — HEPATIC FUNCTION PANEL
ALBUMIN: 4.1 g/dL (ref 3.5–5.2)
ALK PHOS: 67 U/L (ref 39–117)
ALT: 45 U/L — ABNORMAL HIGH (ref 0–35)
AST: 56 U/L — AB (ref 0–37)
BILIRUBIN INDIRECT: 0.3 mg/dL (ref 0.2–1.2)
Bilirubin, Direct: 0.1 mg/dL (ref 0.0–0.3)
TOTAL PROTEIN: 6.6 g/dL (ref 6.0–8.3)
Total Bilirubin: 0.4 mg/dL (ref 0.2–1.2)

## 2014-12-12 ENCOUNTER — Other Ambulatory Visit: Payer: Self-pay

## 2015-01-05 ENCOUNTER — Other Ambulatory Visit (INDEPENDENT_AMBULATORY_CARE_PROVIDER_SITE_OTHER): Payer: Managed Care, Other (non HMO)

## 2015-01-05 DIAGNOSIS — Z1212 Encounter for screening for malignant neoplasm of rectum: Secondary | ICD-10-CM

## 2015-01-05 LAB — POC HEMOCCULT BLD/STL (HOME/3-CARD/SCREEN)
Card #2 Fecal Occult Blod, POC: NEGATIVE
Card #3 Fecal Occult Blood, POC: NEGATIVE
Fecal Occult Blood, POC: NEGATIVE

## 2015-01-30 ENCOUNTER — Other Ambulatory Visit: Payer: Self-pay | Admitting: *Deleted

## 2015-01-30 DIAGNOSIS — F319 Bipolar disorder, unspecified: Secondary | ICD-10-CM

## 2015-01-30 MED ORDER — BUPROPION HCL ER (XL) 300 MG PO TB24: ORAL_TABLET | ORAL | Status: DC

## 2015-02-23 ENCOUNTER — Ambulatory Visit: Payer: Self-pay | Admitting: Internal Medicine

## 2015-03-14 ENCOUNTER — Encounter: Payer: Self-pay | Admitting: Internal Medicine

## 2015-03-14 ENCOUNTER — Ambulatory Visit (INDEPENDENT_AMBULATORY_CARE_PROVIDER_SITE_OTHER): Payer: Managed Care, Other (non HMO) | Admitting: Internal Medicine

## 2015-03-14 VITALS — BP 118/64

## 2015-03-14 DIAGNOSIS — R7309 Other abnormal glucose: Secondary | ICD-10-CM

## 2015-03-14 DIAGNOSIS — IMO0001 Reserved for inherently not codable concepts without codable children: Secondary | ICD-10-CM

## 2015-03-14 DIAGNOSIS — J45909 Unspecified asthma, uncomplicated: Secondary | ICD-10-CM

## 2015-03-14 DIAGNOSIS — R030 Elevated blood-pressure reading, without diagnosis of hypertension: Secondary | ICD-10-CM

## 2015-03-14 DIAGNOSIS — E782 Mixed hyperlipidemia: Secondary | ICD-10-CM

## 2015-03-14 DIAGNOSIS — D509 Iron deficiency anemia, unspecified: Secondary | ICD-10-CM

## 2015-03-14 DIAGNOSIS — F319 Bipolar disorder, unspecified: Secondary | ICD-10-CM

## 2015-03-14 DIAGNOSIS — Z79899 Other long term (current) drug therapy: Secondary | ICD-10-CM

## 2015-03-14 DIAGNOSIS — R7303 Prediabetes: Secondary | ICD-10-CM

## 2015-03-14 DIAGNOSIS — E559 Vitamin D deficiency, unspecified: Secondary | ICD-10-CM

## 2015-03-14 LAB — CBC WITH DIFFERENTIAL/PLATELET
Basophils Absolute: 0 10*3/uL (ref 0.0–0.1)
Basophils Relative: 0 % (ref 0–1)
Eosinophils Absolute: 0.1 10*3/uL (ref 0.0–0.7)
Eosinophils Relative: 1 % (ref 0–5)
HEMATOCRIT: 42.3 % (ref 36.0–46.0)
HEMOGLOBIN: 13.6 g/dL (ref 12.0–15.0)
LYMPHS PCT: 30 % (ref 12–46)
Lymphs Abs: 2.2 10*3/uL (ref 0.7–4.0)
MCH: 27.9 pg (ref 26.0–34.0)
MCHC: 32.2 g/dL (ref 30.0–36.0)
MCV: 86.7 fL (ref 78.0–100.0)
MONO ABS: 0.9 10*3/uL (ref 0.1–1.0)
MPV: 9.4 fL (ref 8.6–12.4)
Monocytes Relative: 12 % (ref 3–12)
NEUTROS ABS: 4.2 10*3/uL (ref 1.7–7.7)
NEUTROS PCT: 57 % (ref 43–77)
Platelets: 353 10*3/uL (ref 150–400)
RBC: 4.88 MIL/uL (ref 3.87–5.11)
RDW: 13.9 % (ref 11.5–15.5)
WBC: 7.4 10*3/uL (ref 4.0–10.5)

## 2015-03-14 MED ORDER — ELETRIPTAN HYDROBROMIDE 20 MG PO TABS: 20.0000 mg | ORAL_TABLET | ORAL | Status: DC | PRN

## 2015-03-14 MED ORDER — BUPROPION HCL ER (XL) 300 MG PO TB24: ORAL_TABLET | ORAL | Status: DC

## 2015-03-14 NOTE — Patient Instructions (Signed)

## 2015-03-14 NOTE — Addendum Note (Signed)
Addended by: Starlyn Skeans A on: 03/14/2015 03:55 PM   Modules accepted: Orders

## 2015-03-14 NOTE — Progress Notes (Signed)
Patient ID: ORVELLA DIGIULIO, female   DOB: [birth year], 64 y.o.   MRN: [id number]  Assessment and Plan:  Hypertension:  -Continue medication,  -monitor blood pressure at home.  -Continue DASH diet.   -Reminder to go to the ER if any CP, SOB, nausea, dizziness, severe HA, changes vision/speech, left arm numbness and tingling, and jaw pain.  Cholesterol: -Continue diet and exercise.  -Check cholesterol.   Pre-diabetes: -consider d/c metformin with further weight loss and control -Continue diet and exercise.  -Check A1C  Vitamin D Def: -check level -continue medications.   GERD -cont zantac -avoid trigger foods -small frequent meals  ADD IN WALKING TO DAILY ROUTINE.  Continue diet and meds as discussed. Further disposition pending results of labs.  HPI 64 y.o. female  presents for 3 month follow up with hypertension, hyperlipidemia, prediabetes and vitamin D.   Her blood pressure has been controlled at home, today their BP is BP: 118/64 mmHg.   She does workout. She denies chest pain, shortness of breath, dizziness.  She reports [place] 10 minutes.     She is on cholesterol medication and denies myalgias. Her cholesterol is at goal. The cholesterol last visit was:   Lab Results  Component Value Date   CHOL 197 11/09/2014   HDL 47 11/09/2014   LDLCALC 106* 11/09/2014   TRIG 218* 11/09/2014   CHOLHDL 4.2 11/09/2014     She has been working on diet and exercise for prediabetes, and denies foot ulcerations, hyperglycemia, hypoglycemia , increased appetite, nausea, paresthesia of the feet, polydipsia, polyuria, visual disturbances, vomiting and weight loss. Last A1C in the office was:  Lab Results  Component Value Date   HGBA1C 5.9* 11/09/2014    Patient is on Vitamin D supplement.  Lab Results  Component Value Date   VD25OH 47 11/09/2014       Current Medications:  Current Outpatient Prescriptions on File Prior to Visit   Medication Sig Dispense Refill  . albuterol (PROVENTIL HFA;VENTOLIN HFA) 108 (90 BASE) MCG/ACT inhaler Inhale 2 puffs into the lungs every 6 (six) hours as needed for wheezing or shortness of breath. 1 Inhaler 2  . FLUoxetine (PROZAC) 20 MG tablet Take 1 tablet (20 mg total) by mouth 2 (two) times daily. 180 tablet 0  . FLUVIRIN SUSP   0  . lamoTRIgine (LAMICTAL) 100 MG tablet Take 1 tablet (100 mg total) by mouth 2 (two) times daily. 180 tablet 1  . metFORMIN (GLUCOPHAGE) 500 MG tablet Take 1 tablet (500 mg total) by mouth daily with breakfast. 90 tablet 1  . methylphenidate (METADATE CD) 10 MG CR capsule Take 1 capsule (10 mg total) by mouth 3 (three) times daily as needed. 90 capsule 0  . ranitidine (ZANTAC) 300 MG tablet Take 1 tablet 2 x daily for acid indigestion & reflux 180 tablet 3   No current facility-administered medications on file prior to visit.    Medical History:  Past Medical History  Diagnosis Date  . Depression   . Migraines   . GERD (gastroesophageal reflux disease)   . Anemia   . Sinus congestion   . Memory loss   . Head trauma   . Hay fever   . OSA on CPAP   . RLS (restless legs syndrome)     Allergies:  Allergies  Allergen Reactions  . Erythromycin Other (See Comments)    TBS  . Ivp Dye [Iodinated Diagnostic Agents] Other (See Comments)  Hot flashes, itching scalp  . Penicillins Hives  . Clarithromycin Other (See Comments)    Itching ankles  . Brintellix [Vortioxetine]     Itching  . Macrodantin [Nitrofurantoin]      Review of Systems:  Review of Systems  Constitutional: Negative for fever and chills.  HENT: Negative for congestion, ear pain and sore throat.   Eyes: Negative.   Respiratory: Negative for cough, shortness of breath and wheezing.   Cardiovascular: Negative for chest pain, palpitations and leg swelling.  Gastrointestinal: Positive for heartburn. Negative for diarrhea, constipation, blood in stool and melena.  Genitourinary:  Negative.   Neurological: Negative for dizziness, sensory change, loss of consciousness and headaches.  Psychiatric/Behavioral: Negative for depression. The patient is not nervous/anxious and does not have insomnia.     Family history- Review and unchanged  Social history- Review and unchanged  Physical Exam: BP 118/64 mmHg Wt Readings from Last 3 Encounters:  12/06/14 181 lb (82.101 kg)  11/09/14 184 lb (83.462 kg)  10/10/14 185 lb (83.915 kg)    General Appearance: Well nourished well developed, in no apparent distress. Eyes: PERRLA, EOMs, conjunctiva no swelling or erythema ENT/Mouth: Ear canals normal without obstruction, swelling, erythma, discharge.  TMs normal bilaterally.  Oropharynx moist, clear, without exudate, or postoropharyngeal swelling. Neck: Supple, thyroid normal,no cervical adenopathy  Respiratory: Respiratory effort normal, Breath sounds clear A&P without rhonchi, wheeze, or rale.  No retractions, no accessory usage. Cardio: RRR with no MRGs. Brisk peripheral pulses without edema.  Abdomen: Soft, + BS,  Non tender, no guarding, rebound, hernias, masses. Musculoskeletal: Full ROM, 5/5 strength, Normal gait Skin: Warm, dry without rashes, lesions, ecchymosis.  Neuro: Awake and oriented X 3, Cranial nerves intact. Normal muscle tone, no cerebellar symptoms. Psych: Normal affect, Insight and Judgment appropriate.    Starlyn Skeans, PA-C 3:37 PM [facility]

## 2015-03-15 LAB — BASIC METABOLIC PANEL WITH GFR
BUN: 28 mg/dL — ABNORMAL HIGH (ref 7–25)
CO2: 28 mEq/L (ref 20–31)
Calcium: 10.6 mg/dL — ABNORMAL HIGH (ref 8.6–10.4)
Chloride: 102 mEq/L (ref 98–110)
Creat: 1.05 mg/dL — ABNORMAL HIGH (ref 0.50–0.99)
GFR, EST NON AFRICAN AMERICAN: 56 mL/min — AB (ref >=60)
GFR, Est African American: 64 mL/min (ref >=60)
Glucose, Bld: 92 mg/dL (ref 65–99)
Potassium: 4.9 mEq/L (ref 3.5–5.3)
Sodium: 142 mEq/L (ref 135–146)

## 2015-03-15 LAB — IRON AND TIBC
%SAT: 10 % — ABNORMAL LOW (ref 20–55)
IRON: 44 ug/dL (ref 42–145)
TIBC: 443 ug/dL (ref 250–470)
UIBC: 399 ug/dL (ref 125–400)

## 2015-03-15 LAB — HEMOGLOBIN A1C
Hgb A1c MFr Bld: 5.7 % — ABNORMAL HIGH (ref <5.7)
MEAN PLASMA GLUCOSE: 117 mg/dL — AB (ref <117)

## 2015-03-15 LAB — HEPATIC FUNCTION PANEL
ALK PHOS: 83 U/L (ref 33–130)
ALT: 66 U/L — ABNORMAL HIGH (ref 6–29)
AST: 59 U/L — ABNORMAL HIGH (ref 10–35)
Albumin: 4.6 g/dL (ref 3.6–5.1)
BILIRUBIN INDIRECT: 0.2 mg/dL (ref 0.2–1.2)
Bilirubin, Direct: 0.1 mg/dL (ref <=0.2)
TOTAL PROTEIN: 7 g/dL (ref 6.1–8.1)
Total Bilirubin: 0.3 mg/dL (ref 0.2–1.2)

## 2015-03-15 LAB — LIPID PANEL
Cholesterol: 220 mg/dL — ABNORMAL HIGH (ref 125–200)
HDL: 50 mg/dL (ref >=46)
LDL CALC: 101 mg/dL (ref <130)
Total CHOL/HDL Ratio: 4.4 Ratio (ref <=5.0)
Triglycerides: 343 mg/dL — ABNORMAL HIGH (ref <150)
VLDL: 69 mg/dL — ABNORMAL HIGH (ref <30)

## 2015-03-15 LAB — MAGNESIUM: Magnesium: 2.2 mg/dL (ref 1.5–2.5)

## 2015-03-15 LAB — INSULIN, RANDOM: INSULIN: 115.1 u[IU]/mL — AB (ref 2.0–19.6)

## 2015-03-15 LAB — VITAMIN D 25 HYDROXY (VIT D DEFICIENCY, FRACTURES): VIT D 25 HYDROXY: 51 ng/mL (ref 30–100)

## 2015-03-15 LAB — TSH: TSH: 3.915 u[IU]/mL (ref 0.350–4.500)

## 2015-03-19 ENCOUNTER — Other Ambulatory Visit: Payer: Self-pay | Admitting: *Deleted

## 2015-03-19 ENCOUNTER — Other Ambulatory Visit: Payer: Self-pay | Admitting: Internal Medicine

## 2015-03-28 ENCOUNTER — Other Ambulatory Visit: Payer: Self-pay | Admitting: Physician Assistant

## 2015-03-28 MED ORDER — FLUOXETINE HCL 20 MG PO TABS: 20.0000 mg | ORAL_TABLET | Freq: Two times a day (BID) | ORAL | Status: DC

## 2015-06-01 ENCOUNTER — Ambulatory Visit: Payer: Self-pay | Admitting: Internal Medicine

## 2015-06-15 ENCOUNTER — Encounter: Payer: Self-pay | Admitting: Internal Medicine

## 2015-06-15 ENCOUNTER — Ambulatory Visit (INDEPENDENT_AMBULATORY_CARE_PROVIDER_SITE_OTHER): Payer: Managed Care, Other (non HMO) | Admitting: Internal Medicine

## 2015-06-15 VITALS — BP 142/90 | HR 68 | Temp 97.5°F | Resp 16 | Wt 188.2 lb

## 2015-06-15 DIAGNOSIS — K219 Gastro-esophageal reflux disease without esophagitis: Secondary | ICD-10-CM | POA: Diagnosis not present

## 2015-06-15 DIAGNOSIS — Z6829 Body mass index (BMI) 29.0-29.9, adult: Secondary | ICD-10-CM

## 2015-06-15 DIAGNOSIS — R030 Elevated blood-pressure reading, without diagnosis of hypertension: Secondary | ICD-10-CM | POA: Diagnosis not present

## 2015-06-15 DIAGNOSIS — Z79899 Other long term (current) drug therapy: Secondary | ICD-10-CM

## 2015-06-15 DIAGNOSIS — IMO0001 Reserved for inherently not codable concepts without codable children: Secondary | ICD-10-CM

## 2015-06-15 DIAGNOSIS — E559 Vitamin D deficiency, unspecified: Secondary | ICD-10-CM

## 2015-06-15 DIAGNOSIS — E782 Mixed hyperlipidemia: Secondary | ICD-10-CM | POA: Diagnosis not present

## 2015-06-15 DIAGNOSIS — R7303 Prediabetes: Secondary | ICD-10-CM | POA: Diagnosis not present

## 2015-06-15 LAB — HEPATIC FUNCTION PANEL
ALBUMIN: 4.3 g/dL (ref 3.6–5.1)
ALT: 32 U/L — AB (ref 6–29)
AST: 33 U/L (ref 10–35)
Alkaline Phosphatase: 81 U/L (ref 33–130)
BILIRUBIN INDIRECT: 0.3 mg/dL (ref 0.2–1.2)
Bilirubin, Direct: 0.1 mg/dL (ref <=0.2)
TOTAL PROTEIN: 6.6 g/dL (ref 6.1–8.1)
Total Bilirubin: 0.4 mg/dL (ref 0.2–1.2)

## 2015-06-15 LAB — BASIC METABOLIC PANEL WITH GFR
BUN: 16 mg/dL (ref 7–25)
CO2: 23 mmol/L (ref 20–31)
Calcium: 9.3 mg/dL (ref 8.6–10.4)
Chloride: 102 mmol/L (ref 98–110)
Creat: 0.76 mg/dL (ref 0.50–0.99)
GFR, Est African American: >89 mL/min (ref >=60)
GFR, Est Non African American: 83 mL/min (ref >=60)
GLUCOSE: 84 mg/dL (ref 65–99)
Potassium: 4.1 mmol/L (ref 3.5–5.3)
Sodium: 135 mmol/L (ref 135–146)

## 2015-06-15 LAB — CBC WITH DIFFERENTIAL/PLATELET
BASOS ABS: 0.1 10*3/uL (ref 0.0–0.1)
BASOS PCT: 1 % (ref 0–1)
EOS ABS: 0.1 10*3/uL (ref 0.0–0.7)
Eosinophils Relative: 1 % (ref 0–5)
HEMATOCRIT: 41.6 % (ref 36.0–46.0)
HEMOGLOBIN: 13.7 g/dL (ref 12.0–15.0)
LYMPHS PCT: 33 % (ref 12–46)
Lymphs Abs: 1.8 10*3/uL (ref 0.7–4.0)
MCH: 28.7 pg (ref 26.0–34.0)
MCHC: 32.9 g/dL (ref 30.0–36.0)
MCV: 87 fL (ref 78.0–100.0)
MPV: 10 fL (ref 8.6–12.4)
Monocytes Absolute: 0.7 10*3/uL (ref 0.1–1.0)
Monocytes Relative: 12 % (ref 3–12)
Neutro Abs: 2.9 10*3/uL (ref 1.7–7.7)
Neutrophils Relative %: 53 % (ref 43–77)
Platelets: 314 10*3/uL (ref 150–400)
RBC: 4.78 MIL/uL (ref 3.87–5.11)
RDW: 13 % (ref 11.5–15.5)
WBC: 5.5 10*3/uL (ref 4.0–10.5)

## 2015-06-15 LAB — LIPID PANEL
CHOLESTEROL: 229 mg/dL — AB (ref 125–200)
HDL: 42 mg/dL — ABNORMAL LOW (ref >=46)
Total CHOL/HDL Ratio: 5.5 Ratio — ABNORMAL HIGH (ref <=5.0)
Triglycerides: 436 mg/dL — ABNORMAL HIGH (ref <150)

## 2015-06-15 LAB — TSH: TSH: 2.029 u[IU]/mL (ref 0.350–4.500)

## 2015-06-15 LAB — HEMOGLOBIN A1C
HEMOGLOBIN A1C: 5.9 % — AB (ref <5.7)
Mean Plasma Glucose: 123 mg/dL — ABNORMAL HIGH (ref <117)

## 2015-06-15 LAB — MAGNESIUM: Magnesium: 1.8 mg/dL (ref 1.5–2.5)

## 2015-06-15 NOTE — Patient Instructions (Signed)

## 2015-06-16 ENCOUNTER — Encounter: Payer: Self-pay | Admitting: Internal Medicine

## 2015-06-16 DIAGNOSIS — E663 Overweight: Secondary | ICD-10-CM | POA: Insufficient documentation

## 2015-06-16 DIAGNOSIS — E669 Obesity, unspecified: Secondary | ICD-10-CM | POA: Insufficient documentation

## 2015-06-16 LAB — INSULIN, RANDOM: Insulin: 162.3 u[IU]/mL — ABNORMAL HIGH (ref 2.0–19.6)

## 2015-06-16 LAB — VITAMIN D 25 HYDROXY (VIT D DEFICIENCY, FRACTURES): VIT D 25 HYDROXY: 40 ng/mL (ref 30–100)

## 2015-06-16 NOTE — Progress Notes (Signed)
Patient ID: Kimberly Ray, female   DOB: [birth year], 64 y.o.   MRN: [id number]   This very nice 64 y.o. DWF  presents for follow up with labile Hypertension, Hyperlipidemia, T2_NIDDM and Vitamin D Deficiency. Patient also has GERD which is controlled with diet & Ranitidine.   Patient has labile HTN  For years and has been monitored expectantly & BP has been controlled at home. Today's BP is elevated at 142/90. Patient has had no complaints of any cardiac type chest pain, palpitations, dyspnea/orthopnea/PND, dizziness, claudication, or dependent edema.   Hyperlipidemia is controlled with diet & meds. Patient denies myalgias or other med SE's. Current  Lipids are not at goal with elevated Cholesterol 229*; HDL 42*; LDL NOT CALC; and elevated Triglycerides 436.   Also, the patient has been treated for T2_NIDDM since Oct 2015 and has had no symptoms of reactive hypoglycemia, diabetic polys, paresthesias or visual blurring.  Current  A1c is 5.9%.     Further, the patient also has history of Vitamin D Deficiency of 37 in 2011 and supplements vitamin D without any suspected side-effects. Current Vitamin D is 40.   Medication Sig  . albuterol  HFA  inhaler Inhale 2 puffs into the lungs every 6 (six) hours as needed for wheezing or shortness of breath. - infrequently  . buPROPion  XL 300 MG 24 hr tablet Take 1 tablet daily  for depression- alternates 1 tab qod with 1/2 tab qod  . eletriptan (RELPAX) 20 MG tablet 1 po at onset of headache. May repeat in 2 hours if headache persists or recurs.  Marland Kitchen FLUoxetine (PROZAC) 20 MG tablet Take 1 tab  times daily.  Marland Kitchen lamoTRIgine (LAMICTAL) 100 MG tablet Take 1 tab 2  times daily.  . metFORMIN  500 MG tablet Take 1 tab daily with breakfast.  . methylphenidate (METADATE CD) 10 MG CR cap Take 1 capsule (10 mg total) by mouth 3 (three) times daily as needed.  . Ranitidine  300 MG tablet Take 1 tablet 2 x daily for acid indigestion & reflux  . FLUVIRIN SUSP    Allergies   Allergen Reactions  . Erythromycin Other (See Comments)    TBS  . Ivp Dye [Iodinated Diagnostic Agents] Other (See Comments)    Hot flashes, itching scalp  . Penicillins Hives  . Clarithromycin Other (See Comments)    Itching ankles  . Brintellix [Vortioxetine]     Itching  . Macrodantin [Nitrofurantoin]    PMHx:   Past Medical History  Diagnosis Date  . Depression   . Migraines   . GERD (gastroesophageal reflux disease)   . Anemia   . Sinus congestion   . Memory loss   . Head trauma   . Hay fever   . OSA on CPAP   . RLS (restless legs syndrome)    Immunization History  Administered Date(s) Administered  . Influenza-Unspecified 06/02/2015  . PPD Test 08/19/2011, 08/30/2013, 11/09/2014  . Pneumococcal Conjugate-13 08/18/2009  . Td 08/19/2003  . Tdap 08/30/2013  . Zoster 08/18/2010   Past Surgical History  Procedure Laterality Date  . Perforated eardrum      left side  . Nasal sinus surgery    . Tonsillectomy and adenoidectomy    . Kidney stone removed     FHx:    Reviewed / unchanged  SHx:    Reviewed / unchanged  Systems Review:  Constitutional: Denies fever, chills, wt changes, headaches, insomnia, fatigue, night sweats, change in appetite. Eyes: Denies redness,  blurred vision, diplopia, discharge, itchy, watery eyes.  ENT: Denies discharge, congestion, post nasal drip, epistaxis, sore throat, earache, hearing loss, dental pain, tinnitus, vertigo, sinus pain, snoring.  CV: Denies chest pain, palpitations, irregular heartbeat, syncope, dyspnea, diaphoresis, orthopnea, PND, claudication or edema. Respiratory: denies cough, dyspnea, DOE, pleurisy, hoarseness, laryngitis, wheezing.  Gastrointestinal: Denies dysphagia, odynophagia, heartburn, reflux, water brash, abdominal pain or cramps, nausea, vomiting, bloating, diarrhea, constipation, hematemesis, melena, hematochezia  or hemorrhoids. Genitourinary: Denies dysuria, frequency, urgency, nocturia, hesitancy,  discharge, hematuria or flank pain. Musculoskeletal: Denies arthralgias, myalgias, stiffness, jt. swelling, pain, limping or strain/sprain.  Skin: Denies pruritus, rash, hives, warts, acne, eczema or change in skin lesion(s). Neuro: No weakness, tremor, incoordination, spasms, paresthesia or pain. Psychiatric: Denies confusion, memory loss or sensory loss. Endo: Denies change in weight, skin or hair change.  Heme/Lymph: No excessive bleeding, bruising or enlarged lymph nodes.  Physical Exam  BP 142/90 mmHg  Pulse 68  Temp(Src) 97.5 F (36.4 C)  Resp 16  Wt 188 lb 3.2 oz (85.367 kg)  Appears well nourished and in no distress. Eyes: PERRLA, EOMs, conjunctiva no swelling or erythema. Sinuses: No frontal/maxillary tenderness ENT/Mouth: EAC's clear, TM's nl w/o erythema, bulging. Nares clear w/o erythema, swelling, exudates. Oropharynx clear without erythema or exudates. Oral hygiene is good. Tongue normal, non obstructing. Hearing intact.  Neck: Supple. Thyroid nl. Car 2+/2+ without bruits, nodes or JVD. Chest: Respirations nl with BS clear & equal w/o rales, rhonchi, wheezing or stridor.  Cor: Heart sounds normal w/ regular rate and rhythm without sig. murmurs, gallops, clicks, or rubs. Peripheral pulses normal and equal  without edema.  Abdomen: Soft & bowel sounds normal. Non-tender w/o guarding, rebound, hernias, masses, or organomegaly.  Lymphatics: Unremarkable.  Musculoskeletal: Full ROM all peripheral extremities, joint stability, 5/5 strength, and normal gait.  Skin: Warm, dry without exposed rashes, lesions or ecchymosis apparent.  Neuro: Cranial nerves intact, reflexes equal bilaterally. Sensory-motor testing grossly intact. Tendon reflexes grossly intact.  Pysch: Alert & oriented x 3.  Insight and judgement nl & appropriate. No ideations.  Assessment and Plan:  1. Elevated BP  - TSH  2. Mixed hyperlipidemia  - Lipid panel  3. Prediabetes  - Hemoglobin A1c -  Insulin, random  4. Vitamin D deficiency  - Vit D  25 hydroxy  5. Gastroesophageal reflux disease   6. Medication management  - CBC with Differential/Platelet - BASIC METABOLIC PANEL WITH GFR - Hepatic function panel - Magnesium  7. BMI 29.92,  adult   Recommended regular exercise, BP monitoring, weight control, and discussed med and SE's. Recommended labs to assess and monitor clinical status. Further disposition pending results of labs. Over 30 minutes of exam, counseling, chart review was performed

## 2015-07-23 ENCOUNTER — Other Ambulatory Visit: Payer: Self-pay | Admitting: *Deleted

## 2015-07-23 MED ORDER — METFORMIN HCL 500 MG PO TABS: 500.0000 mg | ORAL_TABLET | Freq: Two times a day (BID) | ORAL | Status: DC

## 2015-08-08 ENCOUNTER — Other Ambulatory Visit: Payer: Self-pay | Admitting: Emergency Medicine

## 2015-09-26 ENCOUNTER — Other Ambulatory Visit: Payer: Self-pay | Admitting: Internal Medicine

## 2015-10-08 ENCOUNTER — Ambulatory Visit (INDEPENDENT_AMBULATORY_CARE_PROVIDER_SITE_OTHER): Payer: Managed Care, Other (non HMO) | Admitting: Internal Medicine

## 2015-10-08 ENCOUNTER — Ambulatory Visit: Payer: Self-pay | Admitting: Physician Assistant

## 2015-10-08 ENCOUNTER — Encounter: Payer: Self-pay | Admitting: Internal Medicine

## 2015-10-08 VITALS — BP 118/80 | HR 106 | Temp 98.0°F | Resp 16 | Ht 66.5 in | Wt 181.0 lb

## 2015-10-08 DIAGNOSIS — E559 Vitamin D deficiency, unspecified: Secondary | ICD-10-CM | POA: Diagnosis not present

## 2015-10-08 DIAGNOSIS — E782 Mixed hyperlipidemia: Secondary | ICD-10-CM

## 2015-10-08 DIAGNOSIS — IMO0001 Reserved for inherently not codable concepts without codable children: Secondary | ICD-10-CM

## 2015-10-08 DIAGNOSIS — R030 Elevated blood-pressure reading, without diagnosis of hypertension: Secondary | ICD-10-CM

## 2015-10-08 DIAGNOSIS — R7303 Prediabetes: Secondary | ICD-10-CM | POA: Diagnosis not present

## 2015-10-08 DIAGNOSIS — Z79899 Other long term (current) drug therapy: Secondary | ICD-10-CM

## 2015-10-08 DIAGNOSIS — K219 Gastro-esophageal reflux disease without esophagitis: Secondary | ICD-10-CM

## 2015-10-08 LAB — CBC WITH DIFFERENTIAL/PLATELET
Basophils Absolute: 0 10*3/uL (ref 0.0–0.1)
Basophils Relative: 0 % (ref 0–1)
EOS ABS: 0.1 10*3/uL (ref 0.0–0.7)
Eosinophils Relative: 1 % (ref 0–5)
HCT: 40.9 % (ref 36.0–46.0)
HEMOGLOBIN: 13.3 g/dL (ref 12.0–15.0)
LYMPHS ABS: 1.9 10*3/uL (ref 0.7–4.0)
Lymphocytes Relative: 24 % (ref 12–46)
MCH: 28.4 pg (ref 26.0–34.0)
MCHC: 32.5 g/dL (ref 30.0–36.0)
MCV: 87.2 fL (ref 78.0–100.0)
MONOS PCT: 9 % (ref 3–12)
MPV: 9.5 fL (ref 8.6–12.4)
Monocytes Absolute: 0.7 10*3/uL (ref 0.1–1.0)
NEUTROS PCT: 66 % (ref 43–77)
Neutro Abs: 5.3 10*3/uL (ref 1.7–7.7)
PLATELETS: 337 10*3/uL (ref 150–400)
RBC: 4.69 MIL/uL (ref 3.87–5.11)
RDW: 13.4 % (ref 11.5–15.5)
WBC: 8.1 10*3/uL (ref 4.0–10.5)

## 2015-10-08 LAB — BASIC METABOLIC PANEL WITH GFR
BUN: 25 mg/dL (ref 7–25)
CHLORIDE: 101 mmol/L (ref 98–110)
CO2: 25 mmol/L (ref 20–31)
CREATININE: 1.09 mg/dL — AB (ref 0.50–0.99)
Calcium: 9.8 mg/dL (ref 8.6–10.4)
GFR, Est African American: 62 mL/min (ref >=60)
GFR, Est Non African American: 53 mL/min — ABNORMAL LOW (ref >=60)
GLUCOSE: 128 mg/dL — AB (ref 65–99)
Potassium: 4.1 mmol/L (ref 3.5–5.3)
Sodium: 138 mmol/L (ref 135–146)

## 2015-10-08 LAB — HEPATIC FUNCTION PANEL
ALBUMIN: 4.4 g/dL (ref 3.6–5.1)
ALK PHOS: 61 U/L (ref 33–130)
ALT: 36 U/L — ABNORMAL HIGH (ref 6–29)
AST: 40 U/L — ABNORMAL HIGH (ref 10–35)
BILIRUBIN DIRECT: 0.1 mg/dL (ref <=0.2)
BILIRUBIN INDIRECT: 0.4 mg/dL (ref 0.2–1.2)
BILIRUBIN TOTAL: 0.5 mg/dL (ref 0.2–1.2)
TOTAL PROTEIN: 7 g/dL (ref 6.1–8.1)

## 2015-10-08 LAB — LIPID PANEL
CHOL/HDL RATIO: 5.1 ratio — AB (ref <=5.0)
CHOLESTEROL: 231 mg/dL — AB (ref 125–200)
HDL: 45 mg/dL — ABNORMAL LOW (ref >=46)
Triglycerides: 426 mg/dL — ABNORMAL HIGH (ref <150)

## 2015-10-08 LAB — HEMOGLOBIN A1C
Hgb A1c MFr Bld: 5.5 % (ref <5.7)
MEAN PLASMA GLUCOSE: 111 mg/dL (ref <117)

## 2015-10-08 MED ORDER — METHYLPHENIDATE HCL ER (CD) 10 MG PO CPCR: 10.0000 mg | ORAL_CAPSULE | Freq: Three times a day (TID) | ORAL | Status: DC | PRN

## 2015-10-08 MED ORDER — METFORMIN HCL ER 500 MG PO TB24: 500.0000 mg | ORAL_TABLET | Freq: Every day | ORAL | Status: DC

## 2015-10-08 MED ORDER — SUCRALFATE 1 G PO TABS: 1.0000 g | ORAL_TABLET | Freq: Four times a day (QID) | ORAL | Status: DC

## 2015-10-08 MED ORDER — ONDANSETRON 8 MG PO TBDP: ORAL_TABLET | ORAL | Status: DC

## 2015-10-08 NOTE — Patient Instructions (Signed)
Food Choices for Gastroesophageal Reflux Disease, Adult When you have gastroesophageal reflux disease (GERD), the foods you eat and your eating habits are very important. Choosing the right foods can help ease the discomfort of GERD. WHAT GENERAL GUIDELINES DO I NEED TO FOLLOW?  Choose fruits, vegetables, whole grains, low-fat dairy products, and low-fat meat, fish, and poultry.  Limit fats such as oils, salad dressings, butter, nuts, and avocado.  Keep a food diary to identify foods that cause symptoms.  Avoid foods that cause reflux. These may be different for different people.  Eat frequent small meals instead of three large meals each day.  Eat your meals slowly, in a relaxed setting.  Limit fried foods.  Cook foods using methods other than frying.  Avoid drinking alcohol.  Avoid drinking large amounts of liquids with your meals.  Avoid bending over or lying down until 2-3 hours after eating. WHAT FOODS ARE NOT RECOMMENDED? The following are some foods and drinks that may worsen your symptoms: Vegetables Tomatoes. Tomato juice. Tomato and spaghetti sauce. Chili peppers. Onion and garlic. Horseradish. Fruits Oranges, grapefruit, and lemon (fruit and juice). Meats High-fat meats, fish, and poultry. This includes hot dogs, ribs, ham, sausage, salami, and bacon. Dairy Whole milk and chocolate milk. Sour cream. Cream. Butter. Ice cream. Cream cheese.  Beverages Coffee and tea, with or without caffeine. Carbonated beverages or energy drinks. Condiments Hot sauce. Barbecue sauce.  Sweets/Desserts Chocolate and cocoa. Donuts. Peppermint and spearmint. Fats and Oils High-fat foods, including French fries and potato chips. Other Vinegar. Strong spices, such as black pepper, white pepper, red pepper, cayenne, curry powder, cloves, ginger, and chili powder. The items listed above may not be a complete list of foods and beverages to avoid. Contact your dietitian for more  information.   This information is not intended to replace advice given to you by your health care provider. Make sure you discuss any questions you have with your health care provider.   Document Released: 08/04/2005 Document Revised: 08/25/2014 Document Reviewed: 06/08/2013 Elsevier Interactive Patient Education 2016 Elsevier Inc.  

## 2015-10-08 NOTE — Progress Notes (Signed)
Patient ID: Kimberly Ray, female   DOB: 14-Oct-1950, 65 y.o.   MRN: [id number]  Assessment and Plan:  Hypertension:  -Continue medication,  -monitor blood pressure at home.  -Continue DASH diet.   -Reminder to go to the ER if any CP, SOB, nausea, dizziness, severe HA, changes vision/speech, left arm numbness and tingling, and jaw pain.  Cholesterol: -Continue diet and exercise.  -Check cholesterol.   Pre-diabetes: -stop metformin IR -start metformin xr -zofran prn for upset stomach -Continue diet and exercise.  -Check A1C  Vitamin D Def: -check level -continue medications.   GERD -referral to GI -cont ranitidine -carafate prn  Continue diet and meds as discussed. Further disposition pending results of labs.  HPI 65 y.o. female  presents for 3 month follow up with hypertension, hyperlipidemia, prediabetes and vitamin D.   Her blood pressure has been controlled at home, today their BP is BP: 118/80 mmHg.   She does workout. She denies chest pain, shortness of breath, dizziness.   She is on cholesterol medication and denies myalgias. Her cholesterol is at goal. The cholesterol last visit was:   Lab Results  Component Value Date   CHOL 229* 06/15/2015   HDL 42* 06/15/2015   LDLCALC NOT CALC 06/15/2015   TRIG 436* 06/15/2015   CHOLHDL 5.5* 06/15/2015     She has been working on diet and exercise for prediabetes, and denies foot ulcerations, hyperglycemia, hypoglycemia , increased appetite, nausea, paresthesia of the feet, polydipsia, polyuria, visual disturbances, vomiting and weight loss. Last A1C in the office was:  Lab Results  Component Value Date   HGBA1C 5.9* 06/15/2015    Patient is on Vitamin D supplement.  Lab Results  Component Value Date   VD25OH 40 06/15/2015     Patient reports that she does need her FMLA papers filled out for her migraines and also for her depression. She would like them mailed to her in [place].    She also notes that she has been  having a hard time with GERD.  She stopped protonix suddenly.  She reports that she did see Dr. Oletta Lamas and she didn't like him.  She has not had an endoscopy since then.  She has been having a lot of nausea and some diarrhea with the metformin.  She is currently on IR dosing.     Current Medications:  Current Outpatient Prescriptions on File Prior to Visit  Medication Sig Dispense Refill  . albuterol (PROVENTIL HFA;VENTOLIN HFA) 108 (90 BASE) MCG/ACT inhaler Inhale 2 puffs into the lungs every 6 (six) hours as needed for wheezing or shortness of breath. 1 Inhaler 2  . buPROPion (WELLBUTRIN XL) 300 MG 24 hr tablet TAKE 1 TABLET DAILY FOR    DEPRESSION 90 tablet 1  . eletriptan (RELPAX) 20 MG tablet Take 1 tablet (20 mg total) by mouth as needed for migraine or headache. 1 po at onset of headache. May repeat in 2 hours if headache persists or recurs. 24 tablet 3  . FLUoxetine (PROZAC) 20 MG tablet Take 1 tablet (20 mg total) by mouth 2 (two) times daily. 180 tablet 0  . lamoTRIgine (LAMICTAL) 100 MG tablet Take 1 tablet (100 mg total) by mouth 2 (two) times daily. 180 tablet 1  . metFORMIN (GLUCOPHAGE) 500 MG tablet TAKE 1 TABLET (500 MG TOTAL) BY MOUTH 2 (TWO) TIMES DAILY WITH A MEAL. 60 tablet 1  . methylphenidate (METADATE CD) 10 MG CR capsule Take 1 capsule (10 mg total) by mouth 3 (  three) times daily as needed. 90 capsule 0  . ranitidine (ZANTAC) 300 MG tablet Take 1 tablet 2 x daily for acid indigestion & reflux 180 tablet 3   No current facility-administered medications on file prior to visit.    Medical History:  Past Medical History  Diagnosis Date  . Depression   . Migraines   . GERD (gastroesophageal reflux disease)   . Anemia   . Sinus congestion   . Memory loss   . Head trauma   . Hay fever   . OSA on CPAP   . RLS (restless legs syndrome)     Allergies:  Allergies  Allergen Reactions  . Erythromycin Other (See Comments)    TBS  . Ivp Dye [Iodinated Diagnostic  Agents] Other (See Comments)    Hot flashes, itching scalp  . Penicillins Hives  . Clarithromycin Other (See Comments)    Itching ankles  . Brintellix [Vortioxetine]     Itching  . Macrodantin [Nitrofurantoin]      Review of Systems:  Review of Systems  Constitutional: Negative for fever, chills and malaise/fatigue.  HENT: Negative for congestion, ear pain and sore throat.   Respiratory: Negative for cough, shortness of breath and wheezing.   Cardiovascular: Negative for chest pain, palpitations and leg swelling.  Gastrointestinal: Positive for heartburn, nausea and diarrhea. Negative for vomiting, abdominal pain, constipation, blood in stool and melena.  Genitourinary: Negative.   Skin: Negative.   Neurological: Negative for dizziness, sensory change, loss of consciousness and headaches.  Psychiatric/Behavioral: Negative for depression. The patient is not nervous/anxious and does not have insomnia.     Family history- Review and unchanged  Social history- Review and unchanged  Physical Exam: BP 118/80 mmHg  Pulse 106  Temp(Src) 98 F (36.7 C) (Temporal)  Resp 16  Ht 5' 6.5" (1.689 m)  Wt 181 lb (82.101 kg)  BMI 28.78 kg/m2 Wt Readings from Last 3 Encounters:  10/08/15 181 lb (82.101 kg)  06/15/15 188 lb 3.2 oz (85.367 kg)  12/06/14 181 lb (82.101 kg)    General Appearance: Well nourished well developed, in no apparent distress. Eyes: PERRLA, EOMs, conjunctiva no swelling or erythema ENT/Mouth: Ear canals normal without obstruction, swelling, erythma, discharge.  TMs normal bilaterally.  Oropharynx moist, clear, without exudate, or postoropharyngeal swelling. Neck: Supple, thyroid normal,no cervical adenopathy  Respiratory: Respiratory effort normal, Breath sounds clear A&P without rhonchi, wheeze, or rale.  No retractions, no accessory usage. Cardio: RRR with no MRGs. Brisk peripheral pulses without edema.  Abdomen: Soft, + BS,  Non tender, no guarding, rebound,  hernias, masses. Musculoskeletal: Full ROM, 5/5 strength, Normal gait Skin: Warm, dry without rashes, lesions, ecchymosis.  Neuro: Awake and oriented X 3, Cranial nerves intact. Normal muscle tone, no cerebellar symptoms. Psych: Normal affect, Insight and Judgment appropriate.    Starlyn Skeans, PA-C 2:51 PM [facility]

## 2015-10-09 ENCOUNTER — Other Ambulatory Visit: Payer: Self-pay | Admitting: Internal Medicine

## 2015-10-09 LAB — TSH: TSH: 2.61 m[IU]/L

## 2015-10-09 MED ORDER — PRAVASTATIN SODIUM 40 MG PO TABS: 40.0000 mg | ORAL_TABLET | Freq: Every evening | ORAL | Status: DC

## 2015-11-07 ENCOUNTER — Ambulatory Visit
Admission: RE | Admit: 2015-11-07 | Discharge: 2015-11-07 | Disposition: A | Discharge status: Home / Self Care | Payer: Managed Care, Other (non HMO) | Source: Ambulatory Visit

## 2015-11-07 ENCOUNTER — Other Ambulatory Visit: Payer: Self-pay

## 2015-11-07 DIAGNOSIS — Z1231 Encounter for screening mammogram for malignant neoplasm of breast: Secondary | ICD-10-CM

## 2015-11-09 ENCOUNTER — Other Ambulatory Visit: Payer: Self-pay | Admitting: Internal Medicine

## 2015-11-09 DIAGNOSIS — R928 Other abnormal and inconclusive findings on diagnostic imaging of breast: Secondary | ICD-10-CM

## 2015-11-14 ENCOUNTER — Other Ambulatory Visit: Payer: Self-pay | Admitting: Gastroenterology

## 2015-11-16 ENCOUNTER — Ambulatory Visit
Admission: RE | Admit: 2015-11-16 | Discharge: 2015-11-16 | Disposition: A | Discharge status: Home / Self Care | Payer: Managed Care, Other (non HMO) | Source: Ambulatory Visit | Attending: Internal Medicine | Admitting: Internal Medicine

## 2015-11-16 DIAGNOSIS — R928 Other abnormal and inconclusive findings on diagnostic imaging of breast: Secondary | ICD-10-CM

## 2015-11-26 ENCOUNTER — Encounter: Payer: Self-pay | Admitting: Internal Medicine

## 2015-12-15 ENCOUNTER — Ambulatory Visit (HOSPITAL_COMMUNITY)
Admission: EM | Admit: 2015-12-15 | Discharge: 2015-12-15 | Disposition: A | Discharge status: Home / Self Care | Payer: Managed Care, Other (non HMO) | Attending: Emergency Medicine | Admitting: Emergency Medicine

## 2015-12-15 ENCOUNTER — Encounter (HOSPITAL_COMMUNITY): Payer: Self-pay | Admitting: Emergency Medicine

## 2015-12-15 DIAGNOSIS — J209 Acute bronchitis, unspecified: Secondary | ICD-10-CM | POA: Diagnosis not present

## 2015-12-15 MED ORDER — ALBUTEROL SULFATE HFA 108 (90 BASE) MCG/ACT IN AERS: 2.0000 | INHALATION_SPRAY | RESPIRATORY_TRACT | Status: DC | PRN

## 2015-12-15 MED ORDER — HYDROCOD POLST-CPM POLST ER 10-8 MG/5ML PO SUER: 5.0000 mL | Freq: Every evening | ORAL | Status: DC | PRN

## 2015-12-15 MED ORDER — AZITHROMYCIN 250 MG PO TABS: 250.0000 mg | ORAL_TABLET | Freq: Every day | ORAL | Status: DC

## 2015-12-15 MED ORDER — PREDNISONE 10 MG PO TABS: ORAL_TABLET | ORAL | Status: DC

## 2015-12-15 MED ORDER — IPRATROPIUM-ALBUTEROL 0.5-2.5 (3) MG/3ML IN SOLN
RESPIRATORY_TRACT | Status: AC
  Filled 2015-12-15: qty 3

## 2015-12-15 NOTE — ED Notes (Signed)
Patient belongings placed in bag and given to husband. 1 watch, 1 bracelet, 1 shirt, glasses, and notebook.

## 2015-12-15 NOTE — ED Notes (Signed)
C/o cold sx onset x1 week... 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 No acute distress.

## 2015-12-15 NOTE — ED Provider Notes (Signed)
CSN: [id number]     Arrival date & time 12/15/15  1821 History   First MD Initiated Contact with Patient 12/15/15 1844     Chief Complaint  Patient presents with  . URI   (Consider location/radiation/quality/duration/timing/severity/associated sxs/prior Treatment) HPI  History obtained from patient:  Pt presents with the cc [id number], wheezes Duration of symptoms: almost 1 week Treatment prior to arrival:OTC meds Context:recent arrival from [place]. Sudden onset of symptoms Other symptoms include: tightness to breath, dry cough without sputum production Pain score:2 FAMILY HISTORY: Cancer in grandmother SOCIAL HISTORY: non smoker   Past Medical History  Diagnosis Date  . Depression   . Migraines   . GERD (gastroesophageal reflux disease)   . Anemia   . Sinus congestion   . Memory loss   . Head trauma   . Hay fever   . OSA on CPAP   . RLS (restless legs syndrome)    Past Surgical History  Procedure Laterality Date  . Perforated eardrum      left side  . Nasal sinus surgery    . Tonsillectomy and adenoidectomy    . Kidney stone removed     Family History  Problem Relation Age of Onset  . Diabetes Mother   . Diabetes Sister   . Breast cancer Maternal Grandmother    Social History  Substance Use Topics  . Smoking status: Never Smoker   . Smokeless tobacco: Never Used  . Alcohol Use: 0.6 oz/week    1 Shots of liquor per week     Comment: Once a year   OB History    No data available     Review of Systems ROS +'ve cough, wheezes,   Denies: HEADACHE, NAUSEA, ABDOMINAL PAIN, CHEST PAIN, CONGESTION, DYSURIA, SHORTNESS OF BREATH  Allergies  Erythromycin; Ivp dye; Penicillins; Clarithromycin; Brintellix; and Macrodantin  Home Medications   Prior to Admission medications   Medication Sig Start Date End Date Taking? Authorizing Provider  buPROPion (WELLBUTRIN XL) 300 MG 24 hr tablet TAKE 1 TABLET DAILY FOR    DEPRESSION 08/08/15  Yes Unk Pinto, MD   eletriptan (RELPAX) 20 MG tablet Take 1 tablet (20 mg total) by mouth as needed for migraine or headache. 1 po at onset of headache. May repeat in 2 hours if headache persists or recurs. 03/14/15  Yes Courtney Forcucci, PA-C  lamoTRIgine (LAMICTAL) 100 MG tablet Take 1 tablet (100 mg total) by mouth 2 (two) times daily. 10/10/14  Yes Kelby Aline, PA-C  metFORMIN (GLUCOPHAGE XR) 500 MG 24 hr tablet Take 1 tablet (500 mg total) by mouth daily with breakfast. 10/08/15 10/07/16 Yes Courtney Forcucci, PA-C  albuterol (PROVENTIL HFA;VENTOLIN HFA) 108 (90 BASE) MCG/ACT inhaler Inhale 2 puffs into the lungs every 6 (six) hours as needed for wheezing or shortness of breath. 01/19/14   Kelby Aline, PA-C  albuterol (PROVENTIL HFA;VENTOLIN HFA) 108 (90 Base) MCG/ACT inhaler Inhale 2 puffs into the lungs every 4 (four) hours as needed for wheezing or shortness of breath. 12/15/15   Konrad Felix, PA  azithromycin (ZITHROMAX Z-PAK) 250 MG tablet Take 1 tablet (250 mg total) by mouth daily. 12/15/15   Konrad Felix, PA  chlorpheniramine-HYDROcodone [facility]) 10-8 MG/5ML SUER Take 5 mLs by mouth at bedtime as needed for cough. 12/15/15   Konrad Felix, PA  FLUoxetine (PROZAC) 20 MG tablet Take 1 tablet (20 mg total) by mouth 2 (two) times daily. 03/28/15   Vicie Mutters, PA-C  methylphenidate ([place] CD) 10  MG CR capsule Take 1 capsule (10 mg total) by mouth 3 (three) times daily as needed. 10/08/15   Courtney Forcucci, PA-C  ondansetron (ZOFRAN ODT) 8 MG disintegrating tablet 8mg  ODT q4 hours prn nausea 10/08/15   Courtney Forcucci, PA-C  pravastatin (PRAVACHOL) 40 MG tablet Take 1 tablet (40 mg total) by mouth every evening. 10/09/15 10/08/16  Courtney Forcucci, PA-C  predniSONE (DELTASONE) 10 MG tablet Sig: 4 tables once a day for 3 days, 3 tablets once a day X3 days, 2 tablets a day for 3 days, 1 tablet a day for 3 days. 12/15/15   Konrad Felix, PA  ranitidine (ZANTAC) 300 MG tablet Take 1 tablet  2 x daily for acid indigestion & reflux 11/09/14 10/08/15  Unk Pinto, MD  sucralfate (CARAFATE) 1 g tablet Take 1 tablet (1 g total) by mouth 4 (four) times daily. 10/08/15 10/07/16  Starlyn Skeans, PA-C   Meds Ordered and Administered this Visit  Medications - No data to display  BP 129/71 mmHg  Pulse 85  Temp(Src) 98 F (36.7 C) (Oral)  Resp 16  SpO2 97% No data found.   Physical Exam NURSES NOTES AND VITAL SIGNS REVIEWED. CONSTITUTIONAL: Well developed, well nourished, no acute distress HEENT: normocephalic, atraumatic EYES: Conjunctiva normal NECK:normal ROM, supple, no adenopathy PULMONARY:No respiratory distress, normal effort, diffuse wheezes throughout lung fields.  ABDOMINAL: Soft, ND, NT BS+, No CVAT MUSCULOSKELETAL: Normal ROM of all extremities,  SKIN: warm and dry without rash PSYCHIATRIC: Mood and affect, behavior are normal  ED Course  Procedures (including critical care time)  Labs Review Labs Reviewed - No data to display  Imaging Review No results found.   Visual Acuity Review  Right Eye Distance:   Left Eye Distance:   Bilateral Distance:    Right Eye Near:   Left Eye Near:    Bilateral Near:     Nebulizer treatment given with good results.  rx prednisone, albuterol, zpak, tussionex    MDM   1. Bronchospasm with bronchitis, acute     Patient is reassured that there are no issues that require transfer to higher level of care at this time or additional tests. Patient is advised to continue home symptomatic treatment. Patient is advised that if there are new or worsening symptoms to attend the emergency department, contact primary care provider, or return to UC. Instructions of care provided discharged home in stable condition.    THIS NOTE WAS GENERATED USING A VOICE RECOGNITION SOFTWARE PROGRAM. ALL REASONABLE EFFORTS  WERE MADE TO PROOFREAD THIS DOCUMENT FOR ACCURACY.  I have verbally reviewed the discharge instructions with the  patient. A printed AVS was given to the patient.  All questions were answered prior to discharge.      Konrad Felix, PA 12/15/15 2042

## 2015-12-15 NOTE — Discharge Instructions (Signed)
Bronchospasm, Adult A bronchospasm is when the tubes that carry air in and out of your lungs (airways) spasm or tighten. During a bronchospasm it is hard to breathe. This is because the airways get smaller. A bronchospasm can be triggered by:  Allergies. These may be to animals, pollen, food, or mold.  Infection. This is a common cause of bronchospasm.  Exercise.  Irritants. These include pollution, cigarette smoke, strong odors, aerosol sprays, and paint fumes.  Weather changes.  Stress.  Being emotional. HOME CARE   Always have a plan for getting help. Know when to call your doctor and local emergency services (911 in the [place] Know where you can get emergency care.  Only take medicines as told by your doctor.  If you were prescribed an inhaler or nebulizer machine, ask your doctor how to use it correctly. Always use a spacer with your inhaler if you were given one.  Stay calm during an attack. Try to relax and breathe more slowly.  Control your home environment:  Change your heating and air conditioning filter at least once a month.  Limit your use of fireplaces and wood stoves.  Do not  smoke. Do not  allow smoking in your home.  Avoid perfumes and fragrances.  Get rid of pests (such as roaches and mice) and their droppings.  Throw away plants if you see mold on them.  Keep your house clean and dust free.  Replace carpet with wood, tile, or vinyl flooring. Carpet can trap dander and dust.  Use allergy-proof pillows, mattress covers, and box spring covers.  Wash bed sheets and blankets every week in hot water. Dry them in a dryer.  Use blankets that are made of polyester or cotton.  Wash hands frequently. GET HELP IF:  You have muscle aches.  You have chest pain.  The thick spit you spit or cough up (sputum) changes from clear or white to yellow, green, gray, or bloody.  The thick spit you spit or cough up gets thicker.  There are problems that may be  related to the medicine you are given such as:  A rash.  Itching.  Swelling.  Trouble breathing. GET HELP RIGHT AWAY IF:  You feel you cannot breathe or catch your breath.  You cannot stop coughing.  Your treatment is not helping you breathe better.  You have very bad chest pain. MAKE SURE YOU:   Understand these instructions.  Will watch your condition.  Will get help right away if you are not doing well or get worse.   This information is not intended to replace advice given to you by your health care provider. Make sure you discuss any questions you have with your health care provider.   Document Released: 06/01/2009 Document Revised: 08/25/2014 Document Reviewed: 01/25/2013 Elsevier Interactive Patient Education 2016 Elsevier Inc.  

## 2015-12-16 ENCOUNTER — Other Ambulatory Visit: Payer: Self-pay | Admitting: Internal Medicine

## 2015-12-26 ENCOUNTER — Encounter: Payer: Self-pay | Admitting: Internal Medicine

## 2015-12-26 ENCOUNTER — Other Ambulatory Visit: Payer: Self-pay | Admitting: Internal Medicine

## 2015-12-26 ENCOUNTER — Ambulatory Visit (INDEPENDENT_AMBULATORY_CARE_PROVIDER_SITE_OTHER): Payer: Managed Care, Other (non HMO) | Admitting: Internal Medicine

## 2015-12-26 VITALS — BP 122/82 | HR 76 | Temp 97.7°F | Resp 16 | Ht 66.5 in | Wt 185.4 lb

## 2015-12-26 DIAGNOSIS — J4521 Mild intermittent asthma with (acute) exacerbation: Secondary | ICD-10-CM | POA: Diagnosis not present

## 2015-12-26 MED ORDER — ALBUTEROL SULFATE HFA 108 (90 BASE) MCG/ACT IN AERS: INHALATION_SPRAY | RESPIRATORY_TRACT | Status: DC

## 2015-12-26 MED ORDER — FLUTICASONE FUROATE-VILANTEROL 200-25 MCG/INH IN AEPB: 1.0000 | INHALATION_SPRAY | Freq: Every day | RESPIRATORY_TRACT | Status: DC

## 2015-12-26 MED ORDER — AZITHROMYCIN 250 MG PO TABS: ORAL_TABLET | ORAL | Status: DC

## 2015-12-26 MED ORDER — PREDNISONE 20 MG PO TABS: ORAL_TABLET | ORAL | Status: DC

## 2015-12-26 NOTE — Progress Notes (Signed)
Subjective:    Patient ID: Kimberly Ray, female    DOB: May 28, 1951, 65 y.o.   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 Denies fevers, chills, sweats or sputum production. Still has dry cough & occasional wheezing & using her Albuterol MDI rescue occasionally.  Medication Sig  . buPROPion-XL 300 MG  TAKE 1 TABLET DAILY FOR    DEPRESSION  . eletriptan  20 MG tablet Take 1 tablet (20 mg total) by mouth as needed for migraine or headache.   Marland Kitchen FLUoxetine  20 MG tablet Take 1 tablet (20 mg total) by mouth 2 (two) times daily.  Marland Kitchen lamoTRIgine 100 MG tablet Take 1 tablet (100 mg total) by mouth 2 (two) times daily.  . metFORMIN-XR) 500 MG Take 1 tablet (500 mg total) by mouth daily with breakfast.  . METADATE CD 10 MG CR Take 1 capsule (10 mg total) by mouth 3 (three) times daily as needed.  . ondansetron  ODT 8 MG  8mg  ODT q4 hours prn nausea  . pravastatin  40 MG tablet Take 1 tablet (40 mg total) by mouth every evening.  . sucralfate  1 g tablet Take 1 tablet (1 g total) by mouth 4 (four) times daily.  . ranitidine  300 MG tablet Take 1 tablet 2 x daily for acid indigestion & reflux  . albuterol  HFA) 108   inhaler Inhale 2 puffs into the lungs every 6 (six) hours as needed for wheezing or shortness of breath.   Allergies  Allergen Reactions  . Erythromycin Other (See Comments)    TBS  . Ivp Dye [Iodinated Diagnostic Agents] Other (See Comments)    Hot flashes, itching scalp  . Penicillins Hives  . Clarithromycin Other (See Comments)    Itching ankles  . Brintellix [Vortioxetine]     Itching  . Macrodantin [Nitrofurantoin]    Past Medical History  Diagnosis Date  . Depression   . Migraines   . GERD (gastroesophageal reflux disease)   . Anemia   . Sinus congestion   . Memory loss   . Head trauma   . Hay fever   . OSA on  CPAP   . RLS (restless legs syndrome)    Past Surgical History  Procedure Laterality Date  . Perforated eardrum      left side  . Nasal sinus surgery    . Tonsillectomy and adenoidectomy    . Kidney stone removed     Review of Systems  10 point systems review negative except as above.    Objective:   Physical Exam  BP 122/82 mmHg  Pulse 76  Temp(Src) 97.7 F (36.5 C)  Resp 16  Ht 5' 6.5" (1.689 m)  Wt 185 lb 6.4 oz (84.097 kg)  BMI 29.48 kg/m2  Dry cough. Sl hoarse. No stridor.   HEENT - Eac's patent. TM's Nl. EOM's full. PERRLA. NasoOroPharynx clear. Neck - supple. Nl Thyroid. Carotids 2+ & No bruits, nodes, JVD Chest - Clear equal BS w/o Rales, rhonchi, but does have few post-tuissive forced expiratory wheezes.  Cor - Nl HS. RRR w/o sig MGR. PP 1(+). No edema. MS- FROM w/o deformities. Muscle power, tone and bulk Nl. Gait Nl. Neuro - No obvious Cr N abnormalities. Sensory, motor and Cerebellar functions appear Nl w/o focal abnormalities.    Assessment & Plan:  1. Asthmatic bronchitis, mild intermittent with acute exacerbation  - fluticasone furoate-vilanterol (BREO ELLIPTA) 200-25 MCG/INH AEPB; Inhale 1 puff into the lungs daily. Use 1 inhalation daily  Dispense: 60 each; Refill: 99  - Gave Rx-Z-Pak & 1 rf and Rx-Prednisine 20 mm to hold if sputum turns putrid  - Gave sx- Breo 100/25 #14 doses

## 2015-12-26 NOTE — Patient Instructions (Addendum)
Asthma Attack Prevention While you may not be able to control the fact that you have asthma, you can take actions to prevent asthma attacks. The best way to prevent asthma attacks is to maintain good control of your asthma. You can achieve this by:  Taking your medicines as directed.  Avoiding things that can irritate your airways or make your asthma symptoms worse (asthma triggers).  Keeping track of how well your asthma is controlled and of any changes in your symptoms.  Responding quickly to worsening asthma symptoms (asthma attack).  Seeking emergency care when it is needed. WHAT ARE SOME WAYS TO PREVENT AN ASTHMA ATTACK? Have a Plan Work with your health care provider to create a written plan for managing and treating your asthma attacks (asthma action plan). This plan includes:  A list of your asthma triggers and how you can avoid them.  Information on when medicines should be taken and when their dosages should be changed.  The use of a device that measures how well your lungs are working (peak flow meter). Monitor Your Asthma Use your peak flow meter and record your results in a journal every day. A drop in your peak flow numbers on one or more days may indicate the start of an asthma attack. This can happen even before you start to feel symptoms. You can prevent an asthma attack from getting worse by following the steps in your asthma action plan. Avoid Asthma Triggers Work with your asthma health care provider to find out what your asthma triggers are. This can be done by:  Allergy testing.  Keeping a journal that notes when asthma attacks occur and the factors that may have contributed to them.  Determining if there are other medical conditions that are making your asthma worse. Once you have determined your asthma triggers, take steps to avoid them. This may include avoiding excessive or prolonged exposure to:  Dust. Have someone dust and vacuum your home for you once or  twice a week. Using a high-efficiency particulate arrestance (HEPA) vacuum is best.  Smoke. This includes campfire smoke, forest fire smoke, and secondhand smoke from tobacco products.  Pet dander. Avoid contact with animals that you know you are allergic to.  Allergens from trees, grasses or pollens. Avoid spending a lot of time outdoors when pollen counts are high, and on very windy days.  Very cold, dry, or humid air.  Mold.  Foods that contain high amounts of sulfites.  Strong odors.  Outdoor air pollutants, such as engine exhaust.  Indoor air pollutants, such as aerosol sprays and fumes from household cleaners.  Household pests, including dust mites and cockroaches, and pest droppings.  Certain medicines, including NSAIDs. Always talk to your health care provider before stopping or starting any new medicines. Medicines Take over-the-counter and prescription medicines only as told by your health care provider. Many asthma attacks can be prevented by carefully following your medicine schedule. Taking your medicines correctly is especially important when you cannot avoid certain asthma triggers. Act Quickly If an asthma attack does happen, acting quickly can decrease how severe it is and how long it lasts. Take these steps:   Pay attention to your symptoms. If you are coughing, wheezing, or having difficulty breathing, do not wait to see if your symptoms go away on their own. Follow your asthma action plan.  If you have followed your asthma action plan and your symptoms are not improving, call your health care provider or seek immediate medical care   at the nearest hospital. It is important to note how often you need to use your fast-acting rescue inhaler. If you are using your rescue inhaler more often, it may mean that your asthma is not under control. Adjusting your asthma treatment plan may help you to prevent future asthma attacks and help you to gain better control of your  condition. HOW CAN I PREVENT AN ASTHMA ATTACK WHEN I EXERCISE? Follow advice from your health care provider about whether you should use your fast-acting inhaler before exercising. Many people with asthma experience exercise-induced bronchoconstriction (EIB). This condition often worsens during vigorous exercise in cold, humid, or dry environments. Usually, people with EIB can stay very active by pre-treating with a fast-acting inhaler before exercising.   This information is not intended to replace advice given to you by your health care provider. Make sure you discuss any questions you have with your health care provider.   Document Released: 07/23/2009 Document Revised: 04/25/2015 Document Reviewed: 01/04/2015 Elsevier Interactive Patient Education 2016 [employer]  +++++++++++++++++++++++++++++++++++++++++++++++++++++++++++++++++++++++++++++++++++++ Asthma, Adult Asthma is a recurring condition in which the airways tighten and narrow. Asthma can make it difficult to breathe. It can cause coughing, wheezing, and shortness of breath. Asthma episodes, also called asthma attacks, range from minor to life-threatening. Asthma cannot be cured, but medicines and lifestyle changes can help control it. CAUSES Asthma is believed to be caused by inherited (genetic) and environmental factors, but its exact cause is unknown. Asthma may be triggered by allergens, lung infections, or irritants in the air. Asthma triggers are different for each person. Common triggers include:   Animal dander.  Dust mites.  Cockroaches.  Pollen from trees or grass.  Mold.  Smoke.  Air pollutants such as dust, household cleaners, hair sprays, aerosol sprays, paint fumes, strong chemicals, or strong odors.  Cold air, weather changes, and winds (which increase molds and pollens in the air).  Strong emotional expressions such as crying or laughing hard.  Stress.  Certain medicines (such as aspirin) or types of  drugs (such as beta-blockers).  Sulfites in foods and drinks. Foods and drinks that may contain sulfites include dried fruit, potato chips, and sparkling grape juice.  Infections or inflammatory conditions such as the flu, a cold, or an inflammation of the nasal membranes (rhinitis).  Gastroesophageal reflux disease (GERD).  Exercise or strenuous activity. SYMPTOMS Symptoms may occur immediately after asthma is triggered or many hours later. Symptoms include:  Wheezing.  Excessive nighttime or early morning coughing.  Frequent or severe coughing with a common cold.  Chest tightness.  Shortness of breath. DIAGNOSIS  The diagnosis of asthma is made by a review of your medical history and a physical exam. Tests may also be performed. These may include:  Lung function studies. These tests show how much air you breathe in and out.  Allergy tests.  Imaging tests such as X-rays. TREATMENT  Asthma cannot be cured, but it can usually be controlled. Treatment involves identifying and avoiding your asthma triggers. It also involves medicines. There are 2 classes of medicine used for asthma treatment:   Controller medicines. These prevent asthma symptoms from occurring. They are usually taken every day.  Reliever or rescue medicines. These quickly relieve asthma symptoms. They are used as needed and provide short-term relief. Your health care provider will help you create an asthma action plan. An asthma action plan is a written plan for managing and treating your asthma attacks. It includes a list of your asthma triggers and how  they may be avoided. It also includes information on when medicines should be taken and when their dosage should be changed. An action plan may also involve the use of a device called a peak flow meter. A peak flow meter measures how well the lungs are working. It helps you monitor your condition. HOME CARE INSTRUCTIONS   Take medicines only as directed by your  health care provider. Speak with your health care provider if you have questions about how or when to take the medicines.  Use a peak flow meter as directed by your health care provider. Record and keep track of readings.  Understand and use the action plan to help minimize or stop an asthma attack without needing to seek medical care.  Control your home environment in the following ways to help prevent asthma attacks:  Do not smoke. Avoid being exposed to secondhand smoke.  Change your heating and air conditioning filter regularly.  Limit your use of fireplaces and wood stoves.  Get rid of pests (such as roaches and mice) and their droppings.  Throw away plants if you see mold on them.  Clean your floors and dust regularly. Use unscented cleaning products.  Try to have someone else vacuum for you regularly. Stay out of rooms while they are being vacuumed and for a short while afterward. If you vacuum, use a dust mask from a hardware store, a double-layered or microfilter vacuum cleaner bag, or a vacuum cleaner with a HEPA filter.  Replace carpet with wood, tile, or vinyl flooring. Carpet can trap dander and dust.  Use allergy-proof pillows, mattress covers, and box spring covers.  Wash bed sheets and blankets every week in hot water and dry them in a dryer.  Use blankets that are made of polyester or cotton.  Clean bathrooms and kitchens with bleach. If possible, have someone repaint the walls in these rooms with mold-resistant paint. Keep out of the rooms that are being cleaned and painted.  Wash hands frequently. SEEK MEDICAL CARE IF:   You have wheezing, shortness of breath, or a cough even if taking medicine to prevent attacks.  The colored mucus you cough up (sputum) is thicker than usual.  Your sputum changes from clear or white to yellow, green, gray, or bloody.  You have any problems that may be related to the medicines you are taking (such as a rash, itching,  swelling, or trouble breathing).  You are using a reliever medicine more than 2-3 times per week.  Your peak flow is still at 50-79% of your personal best after following your action plan for 1 hour.  You have a fever. SEEK IMMEDIATE MEDICAL CARE IF:   You seem to be getting worse and are unresponsive to treatment during an asthma attack.  You are short of breath even at rest.  You get short of breath when doing very little physical activity.  You have difficulty eating, drinking, or talking due to asthma symptoms.  You develop chest pain.  You develop a fast heartbeat.  You have a bluish color to your lips or fingernails.  You are light-headed, dizzy, or faint.  Your peak flow is less than 50% of your personal best.   This information is not intended to replace advice given to you by your health care provider. Make sure you discuss any questions you have with your health care provider.   Document Released: 08/04/2005 Document Revised: 04/25/2015 Document Reviewed: 03/03/2013 Elsevier Interactive Patient Education 2016 [employer] Inc[employer] +++++++++++++++++++++++++++++++++++++++++++++++++++++++++++++++++++++++++++++++++++++++ Recommend  Adult Low Dose Aspirin or   coated  Aspirin 81 mg daily   To reduce risk of Colon Cancer 20 %,   Skin Cancer 26 % ,   Melanoma 46%   and   Pancreatic cancer 60%   ++++++++++++++++++++++++++++++++++++++++++++++++++++++ Vitamin D goal   is between 70-100.   Please make sure that you are taking your Vitamin D as directed.   It is very important as a natural anti-inflammatory   helping hair, skin, and nails, as well as reducing stroke and heart attack risk.   It helps your bones and helps with mood.  It also decreases numerous cancer risks so please take it as directed.   Low Vit D is associated with a 200-300% higher risk for CANCER   and 200-300% higher risk for HEART   ATTACK  &  STROKE.    .....................................Marland Kitchen  It is also associated with higher death rate at younger ages,   autoimmune diseases like Rheumatoid arthritis, Lupus, Multiple Sclerosis.     Also many other serious conditions, like depression, Alzheimer's  Dementia, infertility, muscle aches, fatigue, fibromyalgia - just to name a few.  ++++++++++++++++++++++++++++++++++++++++++++++++  Recommend the book "The END of DIETING" by Dr Excell Seltzer   & the book "The END of DIABETES " by Dr Excell Seltzer  At [email or web address] - get book & Audio CD's     Being diabetic has a  300% increased risk for heart attack, stroke, cancer, and alzheimer- type vascular dementia. It is very important that you work harder with diet by avoiding all foods that are white. Avoid white rice (brown & wild rice is OK), white potatoes (sweetpotatoes in moderation is OK), White bread or wheat bread or anything made out of white flour like bagels, donuts, rolls, buns, biscuits, cakes, pastries, cookies, pizza crust, and pasta (made from white flour & egg whites) - vegetarian pasta or spinach or wheat pasta is OK. Multigrain breads like Arnold's or Pepperidge Farm, or multigrain sandwich thins or flatbreads.  Diet, exercise and weight loss can reverse and cure diabetes in the early stages.  Diet, exercise and weight loss is very important in the control and prevention of complications of diabetes which affects every system in your body, ie. Brain - dementia/stroke, eyes - glaucoma/blindness, heart - heart attack/heart failure, kidneys - dialysis, stomach - gastric paralysis, intestines - malabsorption, nerves - severe painful neuritis, circulation - gangrene & loss of a leg(s), and finally cancer and Alzheimers.    I recommend avoid fried & greasy foods,  sweets/candy, white rice (brown or wild rice or Quinoa is OK), white potatoes (sweet potatoes are OK) - anything made from white flour - bagels, doughnuts, rolls, buns, biscuits,white and  wheat breads, pizza crust and traditional pasta made of white flour & egg white(vegetarian pasta or spinach or wheat pasta is OK).  Multi-grain bread is OK - like multi-grain flat bread or sandwich thins. Avoid alcohol in excess. Exercise is also important.    Eat all the vegetables you want - avoid meat, especially red meat and dairy - especially cheese.  Cheese is the most concentrated form of trans-fats which is the worst thing to clog up our arteries. Veggie cheese is OK which can be found in the fresh produce section at [employer] or [employer] or [employer]  ++++++++++++++++++++++++++++++++++++++++++++++++++ DASH Eating Plan  DASH stands for "Dietary Approaches to Stop Hypertension."   The DASH eating plan is a healthy eating plan that has been shown to reduce high blood  pressure (hypertension). Additional health benefits may include reducing the risk of type 2 diabetes mellitus, heart disease, and stroke. The DASH eating plan may also help with weight loss.  WHAT DO I NEED TO KNOW ABOUT THE DASH EATING PLAN?  For the DASH eating plan, you will follow these general guidelines:  Choose foods with a percent daily value for sodium of less than 5% (as listed on the food label).  Use salt-free seasonings or herbs instead of table salt or sea salt.  Check with your health care provider or pharmacist before using salt substitutes.  Eat lower-sodium products, often labeled as "lower sodium" or "no salt added."  Eat fresh foods.  Eat more vegetables, fruits, and low-fat dairy products.    Choose whole grains. Look for the word "whole" as the first word in the ingredient list.  Choose fish   Limit sweets, desserts, sugars, and sugary drinks.  Choose heart-healthy fats.  Eat veggie cheese   Eat more home-cooked food and less restaurant, buffet, and fast food.  Limit fried foods.  Cook foods using methods other than frying.  Limit canned vegetables. If you do use them, rinse  them well to decrease the sodium.  When eating at a restaurant, ask that your food be prepared with less salt, or no salt if possible.                      WHAT FOODS CAN I EAT?  Read Dr Fara Olden Fuhrman's books on The End of Dieting & The End of Diabetes  Grains  Whole grain or whole wheat bread. Brown rice. Whole grain or whole wheat pasta. Quinoa, bulgur, and whole grain cereals. Low-sodium cereals. Corn or whole wheat flour tortillas. Whole grain cornbread. Whole grain crackers. Low-sodium crackers.  Vegetables  Fresh or frozen vegetables (raw, steamed, roasted, or grilled). Low-sodium or reduced-sodium tomato and vegetable juices. Low-sodium or reduced-sodium tomato sauce and paste. Low-sodium or reduced-sodium canned vegetables.   Fruits  All fresh, canned (in natural juice), or frozen fruits.  Protein Products   All fish and seafood.  Dried beans, peas, or lentils. Unsalted nuts and seeds. Unsalted canned beans.  Dairy  Low-fat dairy products, such as skim or 1% milk, 2% or reduced-fat cheeses, low-fat ricotta or cottage cheese, or plain low-fat yogurt. Low-sodium or reduced-sodium cheeses.  Fats and Oils  Tub margarines without trans fats. Light or reduced-fat mayonnaise and salad dressings (reduced sodium). Avocado. Safflower, olive, or canola oils. Natural peanut or almond butter.  Other  Unsalted popcorn and pretzels. The items listed above may not be a complete list of recommended foods or beverages. Contact your dietitian for more options.  +++++++++++++++++++++++++++++++++++++++++++  WHAT FOODS ARE NOT RECOMMENDED?  Grains/ White flour or wheat flour  White bread. White pasta. White rice. Refined cornbread. Bagels and croissants. Crackers that contain trans fat.  Vegetables  Creamed or fried vegetables. Vegetables in a . Regular canned vegetables. Regular canned tomato sauce and paste. Regular tomato and vegetable juices.  Fruits  Dried fruits. Canned  fruit in light or heavy syrup. Fruit juice.  Meat and Other Protein Products  Meat in general - RED mwaet & White meat.  Fatty cuts of meat. Ribs, chicken wings, bacon, sausage, bologna, salami, chitterlings, fatback, hot dogs, bratwurst, and packaged luncheon meats.  Dairy  Whole or 2% milk, cream, half-and-half, and cream cheese. Whole-fat or sweetened yogurt. Full-fat cheeses or blue cheese. Nondairy creamers and whipped toppings. Processed cheese, cheese spreads, or  cheese curds.  Condiments  Onion and garlic salt, seasoned salt, table salt, and sea salt. Canned and packaged gravies. Worcestershire sauce. Tartar sauce. Barbecue sauce. Teriyaki sauce. Soy sauce, including reduced sodium. Steak sauce. Fish sauce. Oyster sauce. Cocktail sauce. Horseradish. Ketchup and mustard. Meat flavorings and tenderizers. Bouillon cubes. Hot sauce. Tabasco sauce. Marinades. Taco seasonings. Relishes.  Fats and Oils Butter, stick margarine, lard, shortening and bacon fat. Coconut, palm kernel, or palm oils. Regular salad dressings.  Pickles and olives. Salted popcorn and pretzels.  The items listed above may not be a complete list of foods and beverages to avoid.   

## 2016-01-06 ENCOUNTER — Encounter: Payer: Self-pay | Admitting: *Deleted

## 2016-01-07 ENCOUNTER — Ambulatory Visit (INDEPENDENT_AMBULATORY_CARE_PROVIDER_SITE_OTHER): Payer: Managed Care, Other (non HMO) | Admitting: Internal Medicine

## 2016-01-07 ENCOUNTER — Encounter: Payer: Self-pay | Admitting: Internal Medicine

## 2016-01-07 VITALS — BP 124/84 | HR 76 | Temp 98.0°F | Resp 18 | Ht 66.75 in | Wt 185.0 lb

## 2016-01-07 DIAGNOSIS — Z0001 Encounter for general adult medical examination with abnormal findings: Secondary | ICD-10-CM

## 2016-01-07 DIAGNOSIS — Z79899 Other long term (current) drug therapy: Secondary | ICD-10-CM | POA: Diagnosis not present

## 2016-01-07 DIAGNOSIS — Z1212 Encounter for screening for malignant neoplasm of rectum: Secondary | ICD-10-CM

## 2016-01-07 DIAGNOSIS — I10 Essential (primary) hypertension: Secondary | ICD-10-CM

## 2016-01-07 DIAGNOSIS — F988 Other specified behavioral and emotional disorders with onset usually occurring in childhood and adolescence: Secondary | ICD-10-CM

## 2016-01-07 DIAGNOSIS — Z0000 Encounter for general adult medical examination without abnormal findings: Secondary | ICD-10-CM | POA: Diagnosis not present

## 2016-01-07 DIAGNOSIS — D509 Iron deficiency anemia, unspecified: Secondary | ICD-10-CM

## 2016-01-07 DIAGNOSIS — Z136 Encounter for screening for cardiovascular disorders: Secondary | ICD-10-CM | POA: Diagnosis not present

## 2016-01-07 DIAGNOSIS — E559 Vitamin D deficiency, unspecified: Secondary | ICD-10-CM

## 2016-01-07 DIAGNOSIS — R739 Hyperglycemia, unspecified: Secondary | ICD-10-CM

## 2016-01-07 DIAGNOSIS — E785 Hyperlipidemia, unspecified: Secondary | ICD-10-CM

## 2016-01-07 DIAGNOSIS — J209 Acute bronchitis, unspecified: Secondary | ICD-10-CM

## 2016-01-07 LAB — BASIC METABOLIC PANEL WITH GFR
BUN: 16 mg/dL (ref 7–25)
CHLORIDE: 101 mmol/L (ref 98–110)
CO2: 26 mmol/L (ref 20–31)
Calcium: 9.6 mg/dL (ref 8.6–10.4)
Creat: 0.87 mg/dL (ref 0.50–0.99)
GFR, EST NON AFRICAN AMERICAN: 70 mL/min (ref >=60)
GFR, Est African American: 81 mL/min (ref >=60)
GLUCOSE: 85 mg/dL (ref 65–99)
POTASSIUM: 4.2 mmol/L (ref 3.5–5.3)
SODIUM: 137 mmol/L (ref 135–146)

## 2016-01-07 LAB — IRON AND TIBC
%SAT: 10 % — ABNORMAL LOW (ref 11–50)
Iron: 41 ug/dL — ABNORMAL LOW (ref 45–160)
TIBC: 418 ug/dL (ref 250–450)
UIBC: 377 ug/dL (ref 125–400)

## 2016-01-07 LAB — HEPATIC FUNCTION PANEL
ALK PHOS: 71 U/L (ref 33–130)
ALT: 29 U/L (ref 6–29)
AST: 32 U/L (ref 10–35)
Albumin: 4.4 g/dL (ref 3.6–5.1)
BILIRUBIN DIRECT: 0.1 mg/dL (ref <=0.2)
BILIRUBIN INDIRECT: 0.3 mg/dL (ref 0.2–1.2)
Total Bilirubin: 0.4 mg/dL (ref 0.2–1.2)
Total Protein: 6.8 g/dL (ref 6.1–8.1)

## 2016-01-07 LAB — CBC WITH DIFFERENTIAL/PLATELET
Basophils Absolute: 0 cells/uL (ref 0–200)
Basophils Relative: 0 %
EOS PCT: 2 %
Eosinophils Absolute: 138 cells/uL (ref 15–500)
HEMATOCRIT: 40 % (ref 35.0–45.0)
HEMOGLOBIN: 12.8 g/dL (ref 11.7–15.5)
LYMPHS ABS: 1725 {cells}/uL (ref 850–3900)
Lymphocytes Relative: 25 %
MCH: 27.6 pg (ref 27.0–33.0)
MCHC: 32 g/dL (ref 32.0–36.0)
MCV: 86.4 fL (ref 80.0–100.0)
MONO ABS: 552 {cells}/uL (ref 200–950)
MPV: 9.4 fL (ref 7.5–12.5)
Monocytes Relative: 8 %
NEUTROS ABS: 4485 {cells}/uL (ref 1500–7800)
Neutrophils Relative %: 65 %
Platelets: 335 10*3/uL (ref 140–400)
RBC: 4.63 MIL/uL (ref 3.80–5.10)
RDW: 13.2 % (ref 11.0–15.0)
WBC: 6.9 10*3/uL (ref 3.8–10.8)

## 2016-01-07 LAB — LIPID PANEL
CHOL/HDL RATIO: 3.7 ratio (ref <=5.0)
CHOLESTEROL: 212 mg/dL — AB (ref 125–200)
HDL: 57 mg/dL (ref >=46)
LDL CALC: 95 mg/dL (ref <130)
Triglycerides: 301 mg/dL — ABNORMAL HIGH (ref <150)
VLDL: 60 mg/dL — AB (ref <30)

## 2016-01-07 LAB — URINALYSIS, ROUTINE W REFLEX MICROSCOPIC
BILIRUBIN URINE: NEGATIVE
GLUCOSE, UA: NEGATIVE
Hgb urine dipstick: NEGATIVE
KETONES UR: NEGATIVE
Leukocytes, UA: NEGATIVE
Nitrite: NEGATIVE
PROTEIN: NEGATIVE
Specific Gravity, Urine: 1.008 (ref 1.001–1.035)
pH: 7 (ref 5.0–8.0)

## 2016-01-07 LAB — MAGNESIUM: Magnesium: 1.9 mg/dL (ref 1.5–2.5)

## 2016-01-07 LAB — VITAMIN B12: VITAMIN B 12: 1303 pg/mL — AB (ref 200–1100)

## 2016-01-07 LAB — TSH: TSH: 2.12 mIU/L

## 2016-01-07 MED ORDER — PSEUDOEPH-BROMPHEN-DM 30-2-10 MG/5ML PO SYRP: 5.0000 mL | ORAL_SOLUTION | Freq: Three times a day (TID) | ORAL | Status: DC | PRN

## 2016-01-07 MED ORDER — METHYLPHENIDATE HCL ER (CD) 10 MG PO CPCR: 10.0000 mg | ORAL_CAPSULE | Freq: Three times a day (TID) | ORAL | Status: DC | PRN

## 2016-01-07 NOTE — Progress Notes (Signed)
Complete Physical  Assessment and Plan:   1. Encounter for general adult medical examination with abnormal findings  - CBC with Differential/Platelet - BASIC METABOLIC PANEL WITH GFR - Hepatic function panel - Magnesium  2. Hyperlipidemia -cont diet and exercise - Lipid panel  3. Hyperglycemia  - Hemoglobin A1c - Insulin, random  4. Anemia, iron deficiency  - Iron and TIBC - Vitamin B12  5. Essential hypertension  - Urinalysis, Routine w reflex microscopic (not at [facility]) - Microalbumin / creatinine urine ratio - EKG 12-Lead - TSH  6. Screening for rectal cancer  - POC Hemoccult Bld/Stl (3-Cd Home Screen); Future  7. Vitamin D deficiency  - VITAMIN D 25 Hydroxy (Vit-D Deficiency, Fractures)  8. ADD (attention deficit disorder)  - methylphenidate (METADATE CD) 10 MG CR capsule; Take 1 capsule (10 mg total) by mouth 3 (three) times daily as needed.  Dispense: 90 capsule; Refill: 0  9. Acute bronchitis, unspecified organism  - brompheniramine-pseudoephedrine-DM 30-2-10 MG/5ML syrup; Take 5 mLs by mouth 3 (three) times daily as needed.  Dispense: 473 mL; Refill: 0    Discussed med's effects and SE's. Screening labs and tests as requested with regular follow-up as recommended.  HPI  65 y.o. female  presents for a complete physical.  Her blood pressure has been controlled at home, today their BP is BP: 128/84 mmHg.  She does not workout. She denies chest pain, shortness of breath, dizziness.   She is on cholesterol medication and denies myalgias. Her cholesterol is not at goal. The cholesterol last visit was:  Lab Results  Component Value Date   CHOL 231* 10/08/2015   HDL 45* 10/08/2015   LDLCALC NOT CALC 10/08/2015   TRIG 426* 10/08/2015   CHOLHDL 5.1* 10/08/2015  .  She has been working on diet and exercise for prediabetes, she is not on bASA, she is on ACE/ARB and denies foot ulcerations, hyperglycemia, hypoglycemia , increased appetite, nausea,  paresthesia of the feet, polydipsia, polyuria, visual disturbances, vomiting and weight loss. Last A1C in the office was:  Lab Results  Component Value Date   HGBA1C 5.5 10/08/2015  She is not checking blood sugars.  She is taking 1 metformin daily.    Patient is on Vitamin D supplement.   Lab Results  Component Value Date   VD25OH 40 06/15/2015     She reports that she is taking the protonix daily.  She did have an endoscopy.  She is also taking zantac prn.  She takes carafate prn.     Current Medications:  Current Outpatient Prescriptions on File Prior to Visit  Medication Sig Dispense Refill  . albuterol (PROVENTIL HFA;VENTOLIN HFA) 108 (90 Base) MCG/ACT inhaler Use 1 to 2 inhalations 5 to 10 minutes apart every 4 hours if need too rescue Asthma 18 g 3  . buPROPion (WELLBUTRIN XL) 300 MG 24 hr tablet TAKE 1 TABLET DAILY FOR    DEPRESSION 90 tablet 1  . eletriptan (RELPAX) 20 MG tablet Take 1 tablet (20 mg total) by mouth as needed for migraine or headache. 1 po at onset of headache. May repeat in 2 hours if headache persists or recurs. 24 tablet 3  . FLUoxetine (PROZAC) 20 MG tablet Take 1 tablet (20 mg total) by mouth 2 (two) times daily. 180 tablet 0  . fluticasone furoate-vilanterol (BREO ELLIPTA) 200-25 MCG/INH AEPB Inhale 1 puff into the lungs daily. Use 1 inhalation daily 60 each 99  . lamoTRIgine (LAMICTAL) 100 MG tablet Take 1 tablet (100  mg total) by mouth 2 (two) times daily. 180 tablet 1  . metFORMIN (GLUCOPHAGE XR) 500 MG 24 hr tablet Take 1 tablet (500 mg total) by mouth daily with breakfast. 30 tablet 11  . methylphenidate (METADATE CD) 10 MG CR capsule Take 1 capsule (10 mg total) by mouth 3 (three) times daily as needed. 90 capsule 0  . ondansetron (ZOFRAN ODT) 8 MG disintegrating tablet 8mg  ODT q4 hours prn nausea 30 tablet 1  . pravastatin (PRAVACHOL) 40 MG tablet Take 1 tablet (40 mg total) by mouth every evening. 30 tablet 11  . ranitidine (ZANTAC) 300 MG tablet Take  1 tablet 2 x daily for acid indigestion & reflux 180 tablet 3  . sucralfate (CARAFATE) 1 g tablet Take 1 tablet (1 g total) by mouth 4 (four) times daily. 120 tablet 1   No current facility-administered medications on file prior to visit.    Health Maintenance:   Immunization History  Administered Date(s) Administered  . Influenza-Unspecified 06/02/2015  . PPD Test 08/19/2011, 08/30/2013, 11/09/2014  . Pneumococcal Conjugate-13 08/18/2009  . Td 08/19/2003  . Tdap 08/30/2013  . Zoster 08/18/2010    Tetanus: 2015 Pneumovax: Due today Prevnar: 2011 Flu vaccine: 2016 Zostavax: 2012 MGM: 11/16/15  DEXA: 2015 Colonoscopy: 2013 EGD: 2017 Last Dental Exam: Dr. Tama Gander, twice yearly Last Eye Exam: Dr. Marica Otter, yearly  Patient Care Team: Unk Pinto, MD as PCP - General (Internal Medicine)  Allergies:  Allergies  Allergen Reactions  . Erythromycin Other (See Comments)    TBS  . Ivp Dye [Iodinated Diagnostic Agents] Other (See Comments)    Hot flashes, itching scalp  . Penicillins Hives  . Clarithromycin Other (See Comments)    Itching ankles  . Brintellix [Vortioxetine]     Itching  . Macrodantin [Nitrofurantoin]     Medical History:  Past Medical History  Diagnosis Date  . Depression   . Migraines   . GERD (gastroesophageal reflux disease)   . Anemia   . Sinus congestion   . Memory loss   . Head trauma   . Hay fever   . OSA on CPAP   . RLS (restless legs syndrome)     Surgical History:  Past Surgical History  Procedure Laterality Date  . Perforated eardrum      left side  . Nasal sinus surgery    . Tonsillectomy and adenoidectomy    . Kidney stone removed      Family History:  Family History  Problem Relation Age of Onset  . Diabetes Mother   . Diabetes Sister   . Breast cancer Maternal Grandmother     Social History:  Social History  Substance Use Topics  . Smoking status: Never Smoker   . Smokeless tobacco: Never Used  . Alcohol  Use: 0.6 oz/week    1 Shots of liquor per week     Comment: Once a year    Review of Systems: Review of Systems  Constitutional: Positive for malaise/fatigue. Negative for fever and chills.  HENT: Negative for congestion, ear pain and sore throat.   Respiratory: Negative for cough, shortness of breath and wheezing.   Cardiovascular: Negative for chest pain, palpitations and leg swelling.  Gastrointestinal: Positive for heartburn. Negative for abdominal pain, diarrhea, constipation, blood in stool and melena.  Genitourinary: Negative.   Neurological: Positive for headaches (occasional migraines). Negative for dizziness and loss of consciousness.  Psychiatric/Behavioral: Negative for depression. The patient is nervous/anxious. The patient does not have insomnia.  Physical Exam: Estimated body mass index is 29.21 kg/(m^2) as calculated from the following:   Height as of this encounter: 5' 6.75" (1.695 m).   Weight as of this encounter: 185 lb (83.915 kg). BP 128/84 mmHg  Pulse 76  Temp(Src) 98 F (36.7 C) (Temporal)  Resp 18  Ht 5' 6.75" (1.695 m)  Wt 185 lb (83.915 kg)  BMI 29.21 kg/m2  Wt Readings from Last 3 Encounters:  01/07/16 185 lb (83.915 kg)  12/26/15 185 lb 6.4 oz (84.097 kg)  10/08/15 181 lb (82.101 kg)    General Appearance: Well nourished well developed, in no apparent distress.  Eyes: PERRLA, EOMs, conjunctiva no swelling or erythema ENT/Mouth: Ear canals normal without obstruction, swelling, erythema, or discharge.  TMs normal bilaterally with no erythema, bulging, retraction, or loss of landmark.  Oropharynx moist and clear with no exudate, erythema, or swelling.   Neck: Supple, thyroid normal. No bruits.  No cervical adenopathy Respiratory: Respiratory effort normal, Breath sounds clear A&P without wheeze, rhonchi, rales.   Cardio: RRR without murmurs, rubs or gallops. Brisk peripheral pulses without edema.  Chest: symmetric, with normal  excursions Breasts: Symmetric, without lumps, nipple discharge, retractions.  Abdomen: Soft, nontender, no guarding, rebound, hernias, masses, or organomegaly.  Lymphatics: Non tender without lymphadenopathy.   Musculoskeletal: Full ROM all peripheral extremities,5/5 strength, and normal gait.  Skin: Warm, dry without rashes, lesions, ecchymosis. Neuro: Awake and oriented X 3, Cranial nerves intact, reflexes equal bilaterally. Normal muscle tone, no cerebellar symptoms. Sensation intact.  Psych:  normal affect, Insight and Judgment appropriate.   EKG: WNL no changes.  Over 40 minutes of exam, counseling, chart review and critical decision making was performed  Loma Sousa Forcucci 2:16 PM [facility]

## 2016-01-08 LAB — VITAMIN D 25 HYDROXY (VIT D DEFICIENCY, FRACTURES): Vit D, 25-Hydroxy: 35 ng/mL (ref 30–100)

## 2016-01-08 LAB — MICROALBUMIN / CREATININE URINE RATIO: Creatinine, Urine: 18 mg/dL — ABNORMAL LOW (ref 20–320)

## 2016-01-08 LAB — HEMOGLOBIN A1C
Hgb A1c MFr Bld: 6 % — ABNORMAL HIGH (ref <5.7)
MEAN PLASMA GLUCOSE: 126 mg/dL

## 2016-01-08 LAB — INSULIN, RANDOM: Insulin: 51.3 u[IU]/mL — ABNORMAL HIGH (ref 2.0–19.6)

## 2016-01-29 ENCOUNTER — Other Ambulatory Visit: Payer: Self-pay | Admitting: *Deleted

## 2016-01-29 DIAGNOSIS — J4521 Mild intermittent asthma with (acute) exacerbation: Secondary | ICD-10-CM

## 2016-01-29 MED ORDER — ONDANSETRON 8 MG PO TBDP: ORAL_TABLET | ORAL | Status: DC

## 2016-01-29 MED ORDER — FLUOXETINE HCL 20 MG PO TABS: 20.0000 mg | ORAL_TABLET | Freq: Two times a day (BID) | ORAL | Status: DC

## 2016-02-24 ENCOUNTER — Encounter: Payer: Self-pay | Admitting: *Deleted

## 2016-03-05 ENCOUNTER — Encounter: Payer: Self-pay | Admitting: Internal Medicine

## 2016-03-05 ENCOUNTER — Ambulatory Visit (INDEPENDENT_AMBULATORY_CARE_PROVIDER_SITE_OTHER): Payer: Managed Care, Other (non HMO) | Admitting: Internal Medicine

## 2016-03-05 VITALS — BP 130/80 | HR 89 | Temp 97.3°F | Resp 16 | Ht 66.75 in | Wt 181.8 lb

## 2016-03-05 DIAGNOSIS — F988 Other specified behavioral and emotional disorders with onset usually occurring in childhood and adolescence: Secondary | ICD-10-CM

## 2016-03-05 DIAGNOSIS — R030 Elevated blood-pressure reading, without diagnosis of hypertension: Secondary | ICD-10-CM

## 2016-03-05 DIAGNOSIS — F909 Attention-deficit hyperactivity disorder, unspecified type: Secondary | ICD-10-CM

## 2016-03-05 DIAGNOSIS — IMO0001 Reserved for inherently not codable concepts without codable children: Secondary | ICD-10-CM

## 2016-03-05 DIAGNOSIS — E119 Type 2 diabetes mellitus without complications: Secondary | ICD-10-CM | POA: Diagnosis not present

## 2016-03-05 DIAGNOSIS — E782 Mixed hyperlipidemia: Secondary | ICD-10-CM | POA: Diagnosis not present

## 2016-03-05 DIAGNOSIS — E559 Vitamin D deficiency, unspecified: Secondary | ICD-10-CM | POA: Diagnosis not present

## 2016-03-05 MED ORDER — METHYLPHENIDATE HCL ER (CD) 10 MG PO CPCR: 10.0000 mg | ORAL_CAPSULE | Freq: Three times a day (TID) | ORAL | Status: DC | PRN

## 2016-03-05 NOTE — Progress Notes (Signed)
Patient ID: Kimberly Ray, female   DOB: 06-Jan-1951, 65 y.o.   MRN: [id number]  [facility]IC[facility]                       Unk Pinto, M.D.        Uvaldo Bristle. Silverio Lay, P.A.-C       Starlyn Skeans, P.A.-C   [facility]                [address] [place] [id number] Telephone [phone] Telefax [phone] ______________________________________________________________________     This very nice 65 y.o. DWF presents for follow up with HTN, HLD, T2_DM, Bipolar Depression and Vitamin D Deficiency. Patient also relates a long story about bumping the crown of her head w/o LOC at work and later was referred by her employer & saw a neurologist 3x in [place] and and had a CTscan and also later saw an orthopedist for neck pain and had x-rays and was advised that she has "arthritis" in her neck. She feels her neck discomfort has resolved. She does have hx/o bipolar depression controlled on current meds and did request refill of he Metadate today.     Patient is monitored expectantly for labile HTN & BP has been controlled at home. Today's BP is 130/80 mmHg. Patient has had no complaints of any cardiac type chest pain, palpitations, dyspnea/orthopnea/PND, dizziness, claudication, or dependent edema.     Hyperlipidemia is controlled with diet & meds. Patient denies myalgias or other med SE's. Last Lipids were not at goal with elevated T Cholesterol 212*; HDL 57; LDL 95; & elevated Triglycerides 301 on 01/07/2016.     Also, the patient has history of T2_NIDDM circa 2014 (altho had A1c 6.3% in 2011) and has had no symptoms of reactive hypoglycemia, diabetic polys, paresthesias or visual blurring.  Last A1c was 6.0% on 01/07/2016.      Further, the patient also has history of Vitamin D Deficiency of "33" in 2012 and supplements vitamin D without any suspected side-effects. Last vitamin D was 35 on  01/07/2016.  Medication  Sig  . PROVENTIL HFA inhaler Use 1 to 2 inhalations 5 to 10 minutes apart every 4 hours if need too rescue Asthma  . buPROPion-XL 300 MG TAKE 1 TABLET DAILY  . eletriptan / RELPAX 20 MG tablet Take 1 tab as needed for migraine. May repeat in 2 hours  . FLUoxetine20 MG tablet Take 1 tablet (20 mg total) by mouth 2 (two) times daily.  Marland Kitchen BREO ELLIPTA 200-25  Inhale 1 puff into the lungs daily. Use 1 inhalation daily  . LAMICTAL 100 MG tablet Take 1 tablet (100 mg total) by mouth 2 (two) times daily.  . metFORMIN-XR 500 MG  Take 1 tablet (500 mg total) by mouth daily with breakfast.  . METADATE CD 10 MG CR  Take 1 capsule (10 mg total) by mouth 3 (three) times daily as needed.  . Ondansetron ODT 8 MG  8mg  ODT q4 hours prn nausea  . pravastatin  40 MG tablet Take 1 tablet (40 mg total) by mouth every evening.  . sucralfate  1 g tablet Take 1 tablet (1 g total) by mouth 4 (four) times daily.  . ranitidine  300 MG tablet Take 1 tablet 2 x daily for acid indigestion & reflux   Allergies  Allergen Reactions  .  Erythromycin Other (See Comments)    TBS  . Ivp Dye [Iodinated Diagnostic Agents] Other (See Comments)    Hot flashes, itching scalp  . Penicillins Hives  . Clarithromycin Other (See Comments)    Itching ankles  . Brintellix [Vortioxetine]     Itching  . Macrodantin [Nitrofurantoin]     PMHx:   Past Medical History  Diagnosis Date  . Depression   . Migraines   . GERD (gastroesophageal reflux disease)   . Anemia   . Sinus congestion   . Memory loss   . Head trauma   . Hay fever   . OSA on CPAP   . RLS (restless legs syndrome)    Immunization History  Administered Date(s) Administered  . Influenza-Unspecified 06/02/2015  . PPD Test 08/19/2011, 08/30/2013, 11/09/2014  . Pneumococcal Conjugate-13 08/18/2009  . Td 08/19/2003  . Tdap 08/30/2013  . Zoster 08/18/2010   Past Surgical History  Procedure Laterality Date  . Perforated eardrum      left side  . Nasal sinus  surgery    . Tonsillectomy and adenoidectomy    . Kidney stone removed     FHx:    Reviewed / unchanged  SHx:    Reviewed / unchanged  Systems Review:  Constitutional: Denies fever, chills, wt changes, headaches, insomnia, fatigue, night sweats, change in appetite. Eyes: Denies redness, blurred vision, diplopia, discharge, itchy, watery eyes.  ENT: Denies discharge, congestion, post nasal drip, epistaxis, sore throat, earache, hearing loss, dental pain, tinnitus, vertigo, sinus pain, snoring.  CV: Denies chest pain, palpitations, irregular heartbeat, syncope, dyspnea, diaphoresis, orthopnea, PND, claudication or edema. Respiratory: denies cough, dyspnea, DOE, pleurisy, hoarseness, laryngitis, wheezing.  Gastrointestinal: Denies dysphagia, odynophagia, heartburn, reflux, water brash, abdominal pain or cramps, nausea, vomiting, bloating, diarrhea, constipation, hematemesis, melena, hematochezia  or hemorrhoids. Genitourinary: Denies dysuria, frequency, urgency, nocturia, hesitancy, discharge, hematuria or flank pain. Musculoskeletal: Denies arthralgias, myalgias, stiffness, jt. swelling, pain, limping or strain/sprain.  Skin: Denies pruritus, rash, hives, warts, acne, eczema or change in skin lesion(s). Neuro: No weakness, tremor, incoordination, spasms, paresthesia or pain. Psychiatric: Denies confusion, memory loss or sensory loss. Endo: Denies change in weight, skin or hair change.  Heme/Lymph: No excessive bleeding, bruising or enlarged lymph nodes.  Physical Exam  BP 130/80   Pulse 89  Temp 97.3 F   Resp 16  Ht 5' 6.75" Wt 181 lb 12.8 oz     BMI 28.70   SpO2 98%  Appears well nourished and in no distress.  Eyes: PERRLA, EOMs, conjunctiva no swelling or erythema. Sinuses: No frontal/maxillary tenderness ENT/Mouth: EAC's clear, TM's nl w/o erythema, bulging. Nares clear w/o erythema, swelling, exudates. Oropharynx clear without erythema or exudates. Oral hygiene is good.  Tongue normal, non obstructing. Hearing intact.  Neck: Supple. Thyroid nl. Car 2+/2+ without bruits, nodes or JVD. Chest: Respirations nl with BS clear & equal w/o rales, rhonchi, wheezing or stridor.  Cor: Heart sounds normal w/ regular rate and rhythm without sig. murmurs, gallops, clicks, or rubs. Peripheral pulses normal and equal  without edema.  Abdomen: Soft & bowel sounds normal. Non-tender w/o guarding, rebound, hernias, masses, or organomegaly.  Lymphatics: Unremarkable.  Musculoskeletal: Full ROM all peripheral extremities, joint stability, 5/5 strength, and normal gait.  Skin: Warm, dry without exposed rashes, lesions or ecchymosis apparent.  Neuro: Cranial nerves intact, reflexes equal bilaterally. Sensory-motor testing grossly intact. Tendon reflexes grossly intact.  Pysch: Alert & oriented x 3.  Insight and judgement nl & appropriate. No ideations.  Assessment and Plan:   1. Elevated BP  - Continue medication, monitor blood pressure at home. Continue DASH diet. Reminder to go to the ER if any CP, SOB, nausea, dizziness, severe HA, changes vision/speech, left arm numbness and tingling and jaw pain.  2. Mixed hyperlipidemia  - Continue diet/meds, exercise,& lifestyle modifications. Continue monitor periodic cholesterol/liver & renal functions   3. Diabetes mellitus without complication ([place])  - Continue diet, exercise, lifestyle modifications. Monitor appropriate labs.  4. Vitamin D deficiency  - Continue supplementation.   Recommended regular exercise, BP monitoring, weight control, and discussed med and SE's. Reviewed recent last labs with patient. Reassured patient that the scalp contusion she experienced about 1 month ago will not likely effect any problems in the future. Over 30 minutes of exam, counseling, chart review was performed

## 2016-04-05 ENCOUNTER — Other Ambulatory Visit: Payer: Self-pay | Admitting: Internal Medicine

## 2016-04-05 DIAGNOSIS — F319 Bipolar disorder, unspecified: Secondary | ICD-10-CM

## 2016-04-05 MED ORDER — LAMOTRIGINE 100 MG PO TABS: 100.0000 mg | ORAL_TABLET | Freq: Two times a day (BID) | ORAL | 1 refills | Status: DC

## 2016-04-07 ENCOUNTER — Other Ambulatory Visit: Payer: Self-pay | Admitting: *Deleted

## 2016-04-07 DIAGNOSIS — F319 Bipolar disorder, unspecified: Secondary | ICD-10-CM

## 2016-04-07 MED ORDER — LAMOTRIGINE 100 MG PO TABS: 100.0000 mg | ORAL_TABLET | Freq: Two times a day (BID) | ORAL | 1 refills | Status: DC

## 2016-04-07 MED ORDER — METFORMIN HCL ER 500 MG PO TB24: 500.0000 mg | ORAL_TABLET | Freq: Every day | ORAL | 2 refills | Status: DC

## 2016-04-09 ENCOUNTER — Other Ambulatory Visit: Payer: Self-pay | Admitting: *Deleted

## 2016-04-09 ENCOUNTER — Encounter: Payer: Self-pay | Admitting: Internal Medicine

## 2016-04-09 ENCOUNTER — Ambulatory Visit (INDEPENDENT_AMBULATORY_CARE_PROVIDER_SITE_OTHER): Payer: Managed Care, Other (non HMO) | Admitting: Internal Medicine

## 2016-04-09 ENCOUNTER — Ambulatory Visit: Payer: Self-pay | Admitting: Internal Medicine

## 2016-04-09 ENCOUNTER — Ambulatory Visit (HOSPITAL_COMMUNITY)
Admission: RE | Admit: 2016-04-09 | Discharge: 2016-04-09 | Disposition: A | Discharge status: Home / Self Care | Payer: Managed Care, Other (non HMO) | Source: Ambulatory Visit | Attending: Internal Medicine | Admitting: Internal Medicine

## 2016-04-09 VITALS — BP 118/74 | HR 86 | Temp 98.0°F | Resp 16 | Ht 66.75 in | Wt 181.0 lb

## 2016-04-09 DIAGNOSIS — Z79899 Other long term (current) drug therapy: Secondary | ICD-10-CM | POA: Diagnosis not present

## 2016-04-09 DIAGNOSIS — E119 Type 2 diabetes mellitus without complications: Secondary | ICD-10-CM

## 2016-04-09 DIAGNOSIS — J45901 Unspecified asthma with (acute) exacerbation: Secondary | ICD-10-CM | POA: Insufficient documentation

## 2016-04-09 DIAGNOSIS — E559 Vitamin D deficiency, unspecified: Secondary | ICD-10-CM

## 2016-04-09 DIAGNOSIS — R030 Elevated blood-pressure reading, without diagnosis of hypertension: Secondary | ICD-10-CM

## 2016-04-09 DIAGNOSIS — E782 Mixed hyperlipidemia: Secondary | ICD-10-CM

## 2016-04-09 DIAGNOSIS — J4521 Mild intermittent asthma with (acute) exacerbation: Secondary | ICD-10-CM

## 2016-04-09 DIAGNOSIS — R0602 Shortness of breath: Secondary | ICD-10-CM | POA: Diagnosis present

## 2016-04-09 DIAGNOSIS — IMO0001 Reserved for inherently not codable concepts without codable children: Secondary | ICD-10-CM

## 2016-04-09 LAB — HEPATIC FUNCTION PANEL
ALK PHOS: 72 U/L (ref 33–130)
ALT: 32 U/L — ABNORMAL HIGH (ref 6–29)
AST: 34 U/L (ref 10–35)
Albumin: 4.3 g/dL (ref 3.6–5.1)
BILIRUBIN DIRECT: 0.1 mg/dL (ref <=0.2)
BILIRUBIN INDIRECT: 0.3 mg/dL (ref 0.2–1.2)
BILIRUBIN TOTAL: 0.4 mg/dL (ref 0.2–1.2)
Total Protein: 6.6 g/dL (ref 6.1–8.1)

## 2016-04-09 LAB — CBC WITH DIFFERENTIAL/PLATELET
BASOS PCT: 0 %
Basophils Absolute: 0 cells/uL (ref 0–200)
EOS PCT: 1 %
Eosinophils Absolute: 132 cells/uL (ref 15–500)
HEMATOCRIT: 39.7 % (ref 35.0–45.0)
Hemoglobin: 12.9 g/dL (ref 11.7–15.5)
LYMPHS ABS: 4224 {cells}/uL — AB (ref 850–3900)
LYMPHS PCT: 32 %
MCH: 28.2 pg (ref 27.0–33.0)
MCHC: 32.5 g/dL (ref 32.0–36.0)
MCV: 86.7 fL (ref 80.0–100.0)
MONO ABS: 1320 {cells}/uL — AB (ref 200–950)
MPV: 9.2 fL (ref 7.5–12.5)
Monocytes Relative: 10 %
NEUTROS PCT: 57 %
Neutro Abs: 7524 cells/uL (ref 1500–7800)
Platelets: 334 10*3/uL (ref 140–400)
RBC: 4.58 MIL/uL (ref 3.80–5.10)
RDW: 13.7 % (ref 11.0–15.0)
WBC: 13.2 10*3/uL — AB (ref 3.8–10.8)

## 2016-04-09 LAB — BASIC METABOLIC PANEL WITH GFR
BUN: 20 mg/dL (ref 7–25)
CALCIUM: 10.2 mg/dL (ref 8.6–10.4)
CO2: 25 mmol/L (ref 20–31)
Chloride: 101 mmol/L (ref 98–110)
Creat: 0.97 mg/dL (ref 0.50–0.99)
GFR, EST AFRICAN AMERICAN: 70 mL/min (ref >=60)
GFR, EST NON AFRICAN AMERICAN: 61 mL/min (ref >=60)
Glucose, Bld: 70 mg/dL (ref 65–99)
POTASSIUM: 4.2 mmol/L (ref 3.5–5.3)
SODIUM: 139 mmol/L (ref 135–146)

## 2016-04-09 LAB — LIPID PANEL
CHOL/HDL RATIO: 3.2 ratio (ref <=5.0)
Cholesterol: 163 mg/dL (ref 125–200)
HDL: 51 mg/dL (ref >=46)
LDL CALC: 40 mg/dL (ref <130)
TRIGLYCERIDES: 358 mg/dL — AB (ref <150)
VLDL: 72 mg/dL — AB (ref <30)

## 2016-04-09 LAB — TSH: TSH: 3.59 mIU/L

## 2016-04-09 MED ORDER — IPRATROPIUM-ALBUTEROL 0.5-2.5 (3) MG/3ML IN SOLN: 3.0000 mL | Freq: Once | RESPIRATORY_TRACT | Status: DC

## 2016-04-09 MED ORDER — FLUTICASONE FUROATE-VILANTEROL 200-25 MCG/INH IN AEPB: 1.0000 | INHALATION_SPRAY | Freq: Every day | RESPIRATORY_TRACT | 3 refills | Status: DC

## 2016-04-09 MED ORDER — PREDNISONE 20 MG PO TABS: ORAL_TABLET | ORAL | 0 refills | Status: DC

## 2016-04-09 MED ORDER — BENZONATATE 200 MG PO CAPS: 200.0000 mg | ORAL_CAPSULE | Freq: Three times a day (TID) | ORAL | 0 refills | Status: DC | PRN

## 2016-04-09 NOTE — Patient Instructions (Signed)
Please make sure you are taking breo every day.  Please start taking prednisone taper over the next couple weeks.  Take it until it is gone.  Please use 2 puffs of albuterol every 6 hours as needed for coughing and shortness of breath.  Please continue the claritin daily.  Please continue taking flonase 2 sprays per nostril.  Please use saline to help rinse out your nose.   [facility].  You can also take 50 mg of benadryl at bedtime.  Please take zantac or pepcid twice daily.

## 2016-04-09 NOTE — Progress Notes (Signed)
Assessment and Plan:  Hypertension:  -Continue medication -monitor blood pressure at home. -Continue DASH diet -Reminder to go to the ER if any CP, SOB, nausea, dizziness, severe HA, changes vision/speech, left arm numbness and tingling and jaw pain.  Cholesterol - Continue diet and exercise -Check cholesterol.   Diabetes without complications -Continue diet and exercise.  -Check A1C  Vitamin D Def -continue medications.   Acute uri -prednisone -Duoneb given here -albuterol q6hrs -breo daily -flonase -tessalon -claritin -nasal saline -no further abx currently as CXR was clear  Continue diet and meds as discussed. Further disposition pending results of labs. Discussed med's effects and SE's.    HPI 65 y.o. female  presents for 3 month follow up with hypertension, hyperlipidemia, diabetes and vitamin D deficiency.   Her blood pressure has been controlled at home, today their BP is BP: 118/74.She does not workout. She denies chest pain, shortness of breath, dizziness.   She is on cholesterol medication and denies myalgias. Her cholesterol is not at goal. The cholesterol was:  01/07/2016: Cholesterol 212; HDL 57; LDL Cholesterol 95; Triglycerides 301   She has been working on diet and exercise for diabetes without complications, she is on bASA, she is on ACE/ARB, and denies  foot ulcerations, hyperglycemia, hypoglycemia , increased appetite, nausea, paresthesia of the feet, polydipsia, polyuria, visual disturbances, vomiting and weight loss. Last A1C was: 01/07/2016: Hgb A1c MFr Bld 6.0   Patient is on Vitamin D supplement. 01/07/2016: Vit D, 25-Hydroxy 35  Patient reports that for the last 2 weeks she has been having sore throat, cold and runny nose.  She reports that thus far she has gone to a clinic and had a zpak, tessalon, and prednisone x 3 days.  She reports that she took 2 tablets of tessalon this morning and she feels like she is not doing any better.    Current  Medications:  Current Outpatient Prescriptions on File Prior to Visit  Medication Sig Dispense Refill  . albuterol (PROVENTIL HFA;VENTOLIN HFA) 108 (90 Base) MCG/ACT inhaler Use 1 to 2 inhalations 5 to 10 minutes apart every 4 hours if need too rescue Asthma 18 g 3  . brompheniramine-pseudoephedrine-DM 30-2-10 MG/5ML syrup Take 5 mLs by mouth 3 (three) times daily as needed. 473 mL 0  . buPROPion (WELLBUTRIN XL) 300 MG 24 hr tablet TAKE 1 TABLET DAILY FOR    DEPRESSION 90 tablet 1  . eletriptan (RELPAX) 20 MG tablet Take 1 tablet (20 mg total) by mouth as needed for migraine or headache. 1 po at onset of headache. May repeat in 2 hours if headache persists or recurs. 24 tablet 3  . FLUoxetine (PROZAC) 20 MG tablet Take 1 tablet (20 mg total) by mouth 2 (two) times daily. 180 tablet 1  . fluticasone furoate-vilanterol (BREO ELLIPTA) 200-25 MCG/INH AEPB Inhale 1 puff into the lungs daily. Use 1 inhalation daily 60 each 99  . lamoTRIgine (LAMICTAL) 100 MG tablet Take 1 tablet (100 mg total) by mouth 2 (two) times daily. 180 tablet 1  . metFORMIN (GLUCOPHAGE XR) 500 MG 24 hr tablet Take 1 tablet (500 mg total) by mouth daily with breakfast. 90 tablet 2  . methylphenidate (METADATE CD) 10 MG CR capsule Take 1 capsule (10 mg total) by mouth 3 (three) times daily as needed. 90 capsule 0  . ondansetron (ZOFRAN ODT) 8 MG disintegrating tablet 8mg  ODT q4 hours prn nausea 90 tablet 0  . pravastatin (PRAVACHOL) 40 MG tablet Take 1 tablet (40 mg  total) by mouth every evening. 30 tablet 11  . sucralfate (CARAFATE) 1 g tablet Take 1 tablet (1 g total) by mouth 4 (four) times daily. 120 tablet 1  . ranitidine (ZANTAC) 300 MG tablet Take 1 tablet 2 x daily for acid indigestion & reflux 180 tablet 3   No current facility-administered medications on file prior to visit.    Medical History:  Past Medical History:  Diagnosis Date  . Anemia   . Depression   . GERD (gastroesophageal reflux disease)   . Hay fever    . Head trauma   . Memory loss   . Migraines   . OSA on CPAP   . RLS (restless legs syndrome)   . Sinus congestion    Allergies:  Allergies  Allergen Reactions  . Erythromycin Other (See Comments)    TBS  . Ivp Dye [Iodinated Diagnostic Agents] Other (See Comments)    Hot flashes, itching scalp  . Penicillins Hives  . Clarithromycin Other (See Comments)    Itching ankles  . Brintellix [Vortioxetine]     Itching  . Macrodantin [Nitrofurantoin]      Review of Systems:  Review of Systems  Constitutional: Negative for chills, fever and malaise/fatigue.  HENT: Negative for congestion, ear pain and sore throat.   Eyes: Negative.   Respiratory: Negative for cough, shortness of breath and wheezing.   Cardiovascular: Negative for chest pain, palpitations and leg swelling.  Gastrointestinal: Negative for abdominal pain, blood in stool, constipation, diarrhea, heartburn and melena.  Genitourinary: Negative.   Skin: Negative.   Neurological: Negative for dizziness, sensory change, loss of consciousness and headaches.  Psychiatric/Behavioral: Negative for depression. The patient is not nervous/anxious and does not have insomnia.     Family history- Review and unchanged  Social history- Review and unchanged  Physical Exam: BP 118/74   Pulse 86   Temp 98 F (36.7 C) (Temporal)   Resp 16   Ht 5' 6.75" (1.695 m)   Wt 181 lb (82.1 kg)   SpO2 97%   BMI 28.56 kg/m  Wt Readings from Last 3 Encounters:  04/09/16 181 lb (82.1 kg)  03/05/16 181 lb 12.8 oz (82.5 kg)  01/07/16 185 lb (83.9 kg)   General Appearance: Well nourished well developed, non-toxic appearing, in no apparent distress. Eyes: PERRLA, EOMs, conjunctiva no swelling or erythema ENT/Mouth: Ear canals clear with no erythema, swelling, or discharge.  TMs normal bilaterally, oropharynx clear, moist, with no exudate.   Neck: Supple, thyroid normal, no JVD, no cervical adenopathy.  Respiratory: Respiratory effort  normal, breath sounds clear A&P, no wheeze, rhonchi or rales noted.  No retractions, no accessory muscle usage Cardio: RRR with no MRGs. No noted edema.  Abdomen: Soft, + BS.  Non tender, no guarding, rebound, hernias, masses. Musculoskeletal: Full ROM, 5/5 strength, Normal gait Skin: Warm, dry without rashes, lesions, ecchymosis.  Neuro: Awake and oriented X 3, Cranial nerves intact. No cerebellar symptoms.  Psych: normal affect, Insight and Judgment appropriate.    Starlyn Skeans, PA-C 10:22 AM [facility]

## 2016-04-10 LAB — HEMOGLOBIN A1C
HEMOGLOBIN A1C: 5.3 % (ref <5.7)
Mean Plasma Glucose: 105 mg/dL

## 2016-04-21 ENCOUNTER — Encounter: Payer: Self-pay | Admitting: *Deleted

## 2016-05-14 ENCOUNTER — Encounter: Payer: Self-pay | Admitting: Podiatry

## 2016-05-14 ENCOUNTER — Ambulatory Visit (INDEPENDENT_AMBULATORY_CARE_PROVIDER_SITE_OTHER): Payer: Managed Care, Other (non HMO) | Admitting: Podiatry

## 2016-05-14 VITALS — BP 118/77 | HR 85 | Resp 14 | Ht 67.0 in | Wt 175.0 lb

## 2016-05-14 DIAGNOSIS — M2041 Other hammer toe(s) (acquired), right foot: Secondary | ICD-10-CM

## 2016-05-14 NOTE — Progress Notes (Addendum)
   Subjective:    Patient ID: Kimberly Ray, female    DOB: 1951-06-06, 65 y.o.   MRN: [id number]  HPI this patient presents to the office with chief complaint of a painful fourth and fifth toe of her right foot. She says that she dropped she broke her fifth toe on her right foot years ago. The toe has since become red, swollen and enlarged compared to the fifth toe of the left foot. She says she develops corn between the fourth and fifth toes of the right foot. She says she has used acid treatment in the past. This patient also states that she's having pain noted on the top of the arch area of the left foot. She says this was painful yesterday and continues to be painful today. She presents the office today for an evaluation and treatment of this condition    Review of Systems  All other systems reviewed and are negative.      Objective:   Physical Exam GENERAL APPEARANCE: Alert, conversant. Appropriately groomed. No acute distress.  VASCULAR: Pedal pulses are  palpable at  [facility] and PT bilateral.  Capillary refill time is immediate to all digits,  Normal temperature gradient.   NEUROLOGIC: sensation is normal to 5.07 monofilament at 5/5 sites bilateral.  Light touch is intact bilateral, Muscle strength normal.  MUSCULOSKELETAL: acceptable muscle strength, tone and stability bilateral.  Intrinsic muscluature intact bilateral. Hammer toe fifth toe right and bone spur lateral aspect fourth toe right foot. HAV 1st MPJ  B/L  DERMATOLOGIC: skin color, texture, and turgor are within normal limits.  No preulcerative lesions or ulcers  are seen, no interdigital maceration noted.  No open lesions present.  Digital nails are asymptomatic. No drainage noted.         Assessment & Plan:  Hammer toe fifth toe right foot.  IE  Debride corn.  Dispensed pad fourth interspace right foot. Told her not to use acid infuture.   Gardiner Barefoot DPM

## 2016-05-15 ENCOUNTER — Ambulatory Visit: Payer: Managed Care, Other (non HMO) | Admitting: Podiatry

## 2016-06-12 ENCOUNTER — Encounter: Payer: Self-pay | Admitting: Internal Medicine

## 2016-06-12 ENCOUNTER — Ambulatory Visit (INDEPENDENT_AMBULATORY_CARE_PROVIDER_SITE_OTHER): Payer: Managed Care, Other (non HMO) | Admitting: Internal Medicine

## 2016-06-12 VITALS — BP 122/68 | HR 64 | Temp 98.2°F | Resp 16 | Ht 67.0 in | Wt 179.0 lb

## 2016-06-12 DIAGNOSIS — Z79899 Other long term (current) drug therapy: Secondary | ICD-10-CM | POA: Diagnosis not present

## 2016-06-12 DIAGNOSIS — E119 Type 2 diabetes mellitus without complications: Secondary | ICD-10-CM

## 2016-06-12 DIAGNOSIS — R030 Elevated blood-pressure reading, without diagnosis of hypertension: Secondary | ICD-10-CM | POA: Diagnosis not present

## 2016-06-12 DIAGNOSIS — J45909 Unspecified asthma, uncomplicated: Secondary | ICD-10-CM | POA: Diagnosis not present

## 2016-06-12 DIAGNOSIS — F3130 Bipolar disorder, current episode depressed, mild or moderate severity, unspecified: Secondary | ICD-10-CM | POA: Diagnosis not present

## 2016-06-12 DIAGNOSIS — Z23 Encounter for immunization: Secondary | ICD-10-CM

## 2016-06-12 DIAGNOSIS — E559 Vitamin D deficiency, unspecified: Secondary | ICD-10-CM | POA: Diagnosis not present

## 2016-06-12 DIAGNOSIS — F319 Bipolar disorder, unspecified: Secondary | ICD-10-CM

## 2016-06-12 DIAGNOSIS — E782 Mixed hyperlipidemia: Secondary | ICD-10-CM | POA: Diagnosis not present

## 2016-06-12 DIAGNOSIS — F988 Other specified behavioral and emotional disorders with onset usually occurring in childhood and adolescence: Secondary | ICD-10-CM

## 2016-06-12 LAB — CBC WITH DIFFERENTIAL/PLATELET
BASOS ABS: 0 {cells}/uL (ref 0–200)
Basophils Relative: 0 %
EOS PCT: 1 %
Eosinophils Absolute: 53 cells/uL (ref 15–500)
HCT: 41.3 % (ref 35.0–45.0)
Hemoglobin: 13.1 g/dL (ref 11.7–15.5)
LYMPHS PCT: 30 %
Lymphs Abs: 1590 cells/uL (ref 850–3900)
MCH: 28 pg (ref 27.0–33.0)
MCHC: 31.7 g/dL — AB (ref 32.0–36.0)
MCV: 88.2 fL (ref 80.0–100.0)
MONOS PCT: 11 %
MPV: 9.6 fL (ref 7.5–12.5)
Monocytes Absolute: 583 cells/uL (ref 200–950)
NEUTROS PCT: 58 %
Neutro Abs: 3074 cells/uL (ref 1500–7800)
PLATELETS: 281 10*3/uL (ref 140–400)
RBC: 4.68 MIL/uL (ref 3.80–5.10)
RDW: 13.7 % (ref 11.0–15.0)
WBC: 5.3 10*3/uL (ref 3.8–10.8)

## 2016-06-12 LAB — BASIC METABOLIC PANEL WITH GFR
BUN: 27 mg/dL — ABNORMAL HIGH (ref 7–25)
CALCIUM: 9.5 mg/dL (ref 8.6–10.4)
CO2: 23 mmol/L (ref 20–31)
CREATININE: 0.88 mg/dL (ref 0.50–0.99)
Chloride: 104 mmol/L (ref 98–110)
GFR, EST AFRICAN AMERICAN: 79 mL/min (ref >=60)
GFR, Est Non African American: 69 mL/min (ref >=60)
Glucose, Bld: 81 mg/dL (ref 65–99)
Potassium: 4 mmol/L (ref 3.5–5.3)
SODIUM: 139 mmol/L (ref 135–146)

## 2016-06-12 LAB — HEMOGLOBIN A1C
Hgb A1c MFr Bld: 5.3 % (ref <5.7)
Mean Plasma Glucose: 105 mg/dL

## 2016-06-12 LAB — HEPATIC FUNCTION PANEL
ALT: 28 U/L (ref 6–29)
AST: 32 U/L (ref 10–35)
Albumin: 4.5 g/dL (ref 3.6–5.1)
Alkaline Phosphatase: 60 U/L (ref 33–130)
BILIRUBIN DIRECT: 0.1 mg/dL (ref <=0.2)
Indirect Bilirubin: 0.6 mg/dL (ref 0.2–1.2)
Total Bilirubin: 0.7 mg/dL (ref 0.2–1.2)
Total Protein: 6.5 g/dL (ref 6.1–8.1)

## 2016-06-12 LAB — LIPID PANEL
CHOLESTEROL: 190 mg/dL (ref 125–200)
HDL: 44 mg/dL — AB (ref >=46)
LDL Cholesterol: 106 mg/dL (ref <130)
TRIGLYCERIDES: 202 mg/dL — AB (ref <150)
Total CHOL/HDL Ratio: 4.3 Ratio (ref <=5.0)
VLDL: 40 mg/dL — AB (ref <30)

## 2016-06-12 LAB — TSH: TSH: 2.11 m[IU]/L

## 2016-06-12 MED ORDER — METHYLPHENIDATE HCL ER (CD) 10 MG PO CPCR: 10.0000 mg | ORAL_CAPSULE | Freq: Three times a day (TID) | ORAL | 0 refills | Status: DC | PRN

## 2016-06-12 NOTE — Progress Notes (Signed)
Assessment and Plan:  Hypertension:  -Continue medication -monitor blood pressure at home. -Continue DASH diet -Reminder to go to the ER if any CP, SOB, nausea, dizziness, severe HA, changes vision/speech, left arm numbness and tingling and jaw pain.  Cholesterol - Continue diet and exercise -Check cholesterol.   Diabetes with diabetic chronic kidney disease  -has been recently well controlled -Continue diet and exercise.  -Check A1C  Vitamin D Def -check level -continue medications.   Bipolar -well controlled currently -cont meds -metadate refilled  Asthma -cont albuterol prn -breo if starting to have increased need to use albuterol -wear mask when dealing with dust  Continue diet and meds as discussed. Further disposition pending results of labs. Discussed med's effects and SE's.    HPI 65 y.o. female  presents for 3 month follow up with hypertension, hyperlipidemia, diabetes and vitamin D deficiency.   Her blood pressure has been controlled at home, today their BP is BP: 122/68.She does not workout. She denies chest pain, shortness of breath, dizziness.   She is on cholesterol medication and denies myalgias. Her cholesterol is at goal. The cholesterol was:  04/09/2016: Cholesterol 163; HDL 51; LDL Cholesterol 40; Triglycerides 358   She has been working on diet and exercise for diabetes with diabetic chronic kidney disease, she is not on bASA, she is not on ACE/ARB, and denies  foot ulcerations, hyperglycemia, hypoglycemia , increased appetite, nausea, paresthesia of the feet, polydipsia, polyuria, visual disturbances, vomiting and weight loss. Last A1C was: 04/09/2016: Hgb A1c MFr Bld 5.3. She is not checking blood sugars currently.  She is worried that they might be bad.  She is only taking 1 metformin currently.     Patient is on Vitamin D supplement. 01/07/2016: Vit D, 25-Hydroxy 35  She reports that she is currently renovating her new condo.  She reports that she is  scraping ceilings and is also sanding down walls.  She lives next door to her best friend.    She is not currently taking breo. She has not been taking her albuterol regularly.  She reports that she does wear a mask anytime she is doing renovation work on her apartment.  She is still working for the [employer].  She will retire at 43 if she can make it that long.      Current Medications:  Current Outpatient Prescriptions on File Prior to Visit  Medication Sig Dispense Refill  . albuterol (PROVENTIL HFA;VENTOLIN HFA) 108 (90 Base) MCG/ACT inhaler Use 1 to 2 inhalations 5 to 10 minutes apart every 4 hours if need too rescue Asthma 18 g 3  . benzonatate (TESSALON) 100 MG capsule     . benzonatate (TESSALON) 200 MG capsule Take 1 capsule (200 mg total) by mouth 3 (three) times daily as needed for cough. 30 capsule 0  . brompheniramine-pseudoephedrine-DM 30-2-10 MG/5ML syrup Take 5 mLs by mouth 3 (three) times daily as needed. 473 mL 0  . buPROPion (WELLBUTRIN XL) 300 MG 24 hr tablet TAKE 1 TABLET DAILY FOR    DEPRESSION 90 tablet 1  . eletriptan (RELPAX) 20 MG tablet Take 1 tablet (20 mg total) by mouth as needed for migraine or headache. 1 po at onset of headache. May repeat in 2 hours if headache persists or recurs. 24 tablet 3  . FLUoxetine (PROZAC) 20 MG tablet Take 1 tablet (20 mg total) by mouth 2 (two) times daily. 180 tablet 1  . fluticasone furoate-vilanterol (BREO ELLIPTA) 200-25 MCG/INH AEPB Inhale 1 puff into  the lungs daily. Use 1 inhalation daily 60 each 3  . lamoTRIgine (LAMICTAL) 100 MG tablet Take 1 tablet (100 mg total) by mouth 2 (two) times daily. 180 tablet 1  . metFORMIN (GLUCOPHAGE XR) 500 MG 24 hr tablet Take 1 tablet (500 mg total) by mouth daily with breakfast. 90 tablet 2  . methylphenidate (METADATE CD) 10 MG CR capsule Take 1 capsule (10 mg total) by mouth 3 (three) times daily as needed. 90 capsule 0  . omeprazole (PRILOSEC) 40 MG capsule     . ondansetron (ZOFRAN ODT) 8  MG disintegrating tablet 8mg  ODT q4 hours prn nausea 90 tablet 0  . pravastatin (PRAVACHOL) 40 MG tablet Take 1 tablet (40 mg total) by mouth every evening. 30 tablet 11  . sucralfate (CARAFATE) 1 g tablet Take 1 tablet (1 g total) by mouth 4 (four) times daily. 120 tablet 1  . ranitidine (ZANTAC) 300 MG tablet Take 1 tablet 2 x daily for acid indigestion & reflux 180 tablet 3   Current Facility-Administered Medications on File Prior to Visit  Medication Dose Route Frequency Provider Last Rate Last Dose  . ipratropium-albuterol (DUONEB) 0.5-2.5 (3) MG/3ML nebulizer solution 3 mL  3 mL Nebulization Once Starlyn Skeans, PA-C       Medical History:  Past Medical History:  Diagnosis Date  . Anemia   . Depression   . GERD (gastroesophageal reflux disease)   . Hay fever   . Head trauma   . IBS (irritable bowel syndrome)   . Memory loss   . Migraines   . OSA on CPAP   . Reflux   . RLS (restless legs syndrome)   . Sinus congestion    Allergies:  Allergies  Allergen Reactions  . Erythromycin Other (See Comments)    TBS  . Ivp Dye [Iodinated Diagnostic Agents] Other (See Comments)    Hot flashes, itching scalp  . Penicillins Hives  . Clarithromycin Other (See Comments)    Itching ankles  . Brintellix [Vortioxetine]     Itching  . Macrodantin [Nitrofurantoin]      Review of Systems:  Review of Systems  Constitutional: Negative for chills, fever and malaise/fatigue.  HENT: Negative for congestion, ear pain and sore throat.   Eyes: Negative.   Respiratory: Negative for cough, shortness of breath and wheezing.   Cardiovascular: Negative for chest pain, palpitations and leg swelling.  Gastrointestinal: Negative for abdominal pain, blood in stool, constipation, diarrhea, heartburn and melena.  Genitourinary: Negative.   Skin: Negative.   Neurological: Negative for dizziness, sensory change, loss of consciousness and headaches.  Psychiatric/Behavioral: Negative for depression.  The patient is not nervous/anxious and does not have insomnia.     Family history- Review and unchanged  Social history- Review and unchanged  Physical Exam: BP 122/68   Pulse 64   Temp 98.2 F (36.8 C) (Temporal)   Resp 16   Ht 5\' 7"  (1.702 m)   Wt 179 lb (81.2 kg)   BMI 28.04 kg/m  Wt Readings from Last 3 Encounters:  06/12/16 179 lb (81.2 kg)  05/14/16 175 lb (79.4 kg)  04/09/16 181 lb (82.1 kg)   General Appearance: Well nourished well developed, non-toxic appearing, in no apparent distress. Eyes: PERRLA, EOMs, conjunctiva no swelling or erythema ENT/Mouth: Ear canals clear with no erythema, swelling, or discharge.  TMs normal bilaterally, oropharynx clear, moist, with no exudate.   Neck: Supple, thyroid normal, no JVD, no cervical adenopathy.  Respiratory: Respiratory effort normal, breath sounds  clear A&P, no wheeze, rhonchi or rales noted.  No retractions, no accessory muscle usage Cardio: RRR with no MRGs. No noted edema.  Abdomen: Soft, + BS.  Non tender, no guarding, rebound, hernias, masses. Musculoskeletal: Full ROM, 5/5 strength, Normal gait Skin: Warm, dry without rashes, lesions, ecchymosis.  Neuro: Awake and oriented X 3, Cranial nerves intact. No cerebellar symptoms.  Psych: normal affect, Insight and Judgment appropriate.    Starlyn Skeans, PA-C 9:32 AM [facility]

## 2016-06-19 ENCOUNTER — Other Ambulatory Visit: Payer: Self-pay | Admitting: Internal Medicine

## 2016-07-13 ENCOUNTER — Other Ambulatory Visit: Payer: Self-pay | Admitting: Physician Assistant

## 2016-07-13 ENCOUNTER — Other Ambulatory Visit: Payer: Self-pay | Admitting: Internal Medicine

## 2016-07-13 DIAGNOSIS — F319 Bipolar disorder, unspecified: Secondary | ICD-10-CM

## 2016-07-21 ENCOUNTER — Ambulatory Visit: Payer: Self-pay | Admitting: Internal Medicine

## 2016-08-04 ENCOUNTER — Ambulatory Visit: Payer: Self-pay | Admitting: Internal Medicine

## 2016-08-07 ENCOUNTER — Ambulatory Visit (INDEPENDENT_AMBULATORY_CARE_PROVIDER_SITE_OTHER): Payer: Managed Care, Other (non HMO) | Admitting: Internal Medicine

## 2016-08-07 ENCOUNTER — Encounter: Payer: Self-pay | Admitting: Internal Medicine

## 2016-08-07 DIAGNOSIS — F988 Other specified behavioral and emotional disorders with onset usually occurring in childhood and adolescence: Secondary | ICD-10-CM | POA: Diagnosis not present

## 2016-08-07 MED ORDER — BUPROPION HCL ER (XL) 300 MG PO TB24: ORAL_TABLET | ORAL | 0 refills | Status: DC

## 2016-08-07 MED ORDER — METHYLPHENIDATE HCL ER (CD) 10 MG PO CPCR: 10.0000 mg | ORAL_CAPSULE | Freq: Three times a day (TID) | ORAL | 0 refills | Status: DC | PRN

## 2016-08-07 NOTE — Patient Instructions (Signed)

## 2016-08-07 NOTE — Progress Notes (Signed)
Assessment and Plan:   1. Attention deficit disorder, unspecified hyperactivity presence -cont medication -refill given - methylphenidate (METADATE CD) 10 MG CR capsule; Take 1 capsule (10 mg total) by mouth 3 (three) times daily as needed.  Dispense: 90 capsule; Refill: 0     HPI 65 y.o.female presents for follow-up of ADD.  She reports that she is doing well.  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  She is taking breaks from medication. Patient reports that they have been doing well.  female is taking their medication.  They are not having difficulty with their medications.    Past Medical History:  Diagnosis Date  . Anemia   . Depression   . GERD (gastroesophageal reflux disease)   . Hay fever   . Head trauma   . IBS (irritable bowel syndrome)   . Memory loss   . Migraines   . OSA on CPAP   . Reflux   . RLS (restless legs syndrome)   . Sinus congestion      Allergies  Allergen Reactions  . Erythromycin Other (See Comments)    TBS  . Ivp Dye [Iodinated Diagnostic Agents] Other (See Comments)    Hot flashes, itching scalp  . Penicillins Hives  . Clarithromycin Other (See Comments)    Itching ankles  . Brintellix [Vortioxetine]     Itching  . Macrodantin [Nitrofurantoin]       Current Outpatient Prescriptions on File Prior to Visit  Medication Sig Dispense Refill  . albuterol (PROVENTIL HFA;VENTOLIN HFA) 108 (90 Base) MCG/ACT inhaler Use 1 to 2 inhalations 5 to 10 minutes apart every 4 hours if need too rescue Asthma 18 g 3  . eletriptan (RELPAX) 20 MG tablet Take 1 tablet (20 mg total) by mouth as needed for migraine or headache. 1 po at onset of headache. May repeat in 2 hours if headache persists or recurs. 24 tablet 3  . FLUoxetine (PROZAC) 20 MG tablet TAKE 1 TABLET TWICE A DAY 180 tablet 1  . fluticasone furoate-vilanterol (BREO ELLIPTA) 200-25 MCG/INH AEPB Inhale 1 puff into the lungs daily. Use 1 inhalation daily 60 each 3  .  lamoTRIgine (LAMICTAL) 100 MG tablet Take 1 tablet (100 mg total) by mouth 2 (two) times daily. 180 tablet 1  . lamoTRIgine (LAMICTAL) 100 MG tablet TAKE 1 TABLET TWICE A DAY 180 tablet 1  . metFORMIN (GLUCOPHAGE XR) 500 MG 24 hr tablet Take 1 tablet (500 mg total) by mouth daily with breakfast. 90 tablet 2  . omeprazole (PRILOSEC) 40 MG capsule     . ondansetron (ZOFRAN-ODT) 8 MG disintegrating tablet PLACE 1 TABLET ON THE      TONGUE AND ALLOW TO        DISSOLVE EVERY 4 HOURS AS  NEEDED FOR NAUSEA 90 tablet 0  . pravastatin (PRAVACHOL) 40 MG tablet Take 1 tablet (40 mg total) by mouth every evening. 30 tablet 11  . sucralfate (CARAFATE) 1 g tablet Take 1 tablet (1 g total) by mouth 4 (four) times daily. 120 tablet 1  . ranitidine (ZANTAC) 300 MG tablet Take 1 tablet 2 x daily for acid indigestion & reflux 180 tablet 3   Current Facility-Administered Medications on File Prior to Visit  Medication Dose Route Frequency Provider Last Rate Last Dose  . ipratropium-albuterol (DUONEB) 0.5-2.5 (3) MG/3ML nebulizer solution 3 mL  3 mL Nebulization Once [employer], PA-C        ROS: all negative except  above.   Physical Exam: Filed Weights   08/07/16 1054  Weight: 175 lb (79.4 kg)   BP 116/70   Pulse 86   Temp 98.2 F (36.8 C) (Temporal)   Resp 16   Ht 5\' 7"  (1.702 m)   Wt 175 lb (79.4 kg)   BMI 27.41 kg/m  General Appearance: Well developed well nourished, non-toxic appearing in no apparent distress. Eyes: PERRLA, EOMs, conjunctiva w/ no swelling or erythema or discharge Sinuses: No Frontal/maxillary tenderness ENT/Mouth: Ear canals clear without swelling or erythema.  TM's normal bilaterally with no retractions, bulging, or loss of landmarks.   Neck: Supple, thyroid normal, no notable JVD  Respiratory: Respiratory effort normal, Clear breath sounds anteriorly and posteriorly bilaterally without rales, rhonchi, wheezing or stridor. No retractions or accessory muscle usage. Cardio:  RRR with no MRGs.   Abdomen: Soft, + BS.  Non tender, no guarding, rebound, hernias, masses.  Musculoskeletal: Full ROM, 5/5 strength, normal gait.  Skin: Warm, dry without rashes  Neuro: Awake and oriented X 3, Cranial nerves intact. Normal muscle tone, no cerebellar symptoms. Sensation intact.  Psych: normal affect, Insight and Judgment appropriate.     Starlyn Skeans, PA-C 11:23 AM [facility]

## 2016-08-25 ENCOUNTER — Other Ambulatory Visit: Payer: Self-pay | Admitting: *Deleted

## 2016-08-25 MED ORDER — BUPROPION HCL ER (XL) 300 MG PO TB24: ORAL_TABLET | ORAL | 0 refills | Status: DC

## 2016-08-26 ENCOUNTER — Other Ambulatory Visit: Payer: Self-pay | Admitting: *Deleted

## 2016-08-26 MED ORDER — BUPROPION HCL ER (XL) 300 MG PO TB24: ORAL_TABLET | ORAL | 3 refills | Status: DC

## 2016-09-08 ENCOUNTER — Ambulatory Visit (INDEPENDENT_AMBULATORY_CARE_PROVIDER_SITE_OTHER): Payer: Managed Care, Other (non HMO) | Admitting: Internal Medicine

## 2016-09-08 ENCOUNTER — Encounter: Payer: Self-pay | Admitting: Internal Medicine

## 2016-09-08 VITALS — BP 126/80 | HR 82 | Temp 98.2°F | Ht 67.0 in | Wt 183.0 lb

## 2016-09-08 DIAGNOSIS — F331 Major depressive disorder, recurrent, moderate: Secondary | ICD-10-CM

## 2016-09-08 DIAGNOSIS — K219 Gastro-esophageal reflux disease without esophagitis: Secondary | ICD-10-CM | POA: Diagnosis not present

## 2016-09-08 DIAGNOSIS — F988 Other specified behavioral and emotional disorders with onset usually occurring in childhood and adolescence: Secondary | ICD-10-CM | POA: Diagnosis not present

## 2016-09-08 DIAGNOSIS — G43809 Other migraine, not intractable, without status migrainosus: Secondary | ICD-10-CM | POA: Diagnosis not present

## 2016-09-08 MED ORDER — BENZONATATE 100 MG PO CAPS: 100.0000 mg | ORAL_CAPSULE | Freq: Four times a day (QID) | ORAL | 1 refills | Status: DC | PRN

## 2016-09-08 MED ORDER — PREDNISONE 20 MG PO TABS: ORAL_TABLET | ORAL | 0 refills | Status: DC

## 2016-09-08 MED ORDER — METHYLPHENIDATE HCL ER (CD) 10 MG PO CPCR: 10.0000 mg | ORAL_CAPSULE | Freq: Three times a day (TID) | ORAL | 0 refills | Status: DC | PRN

## 2016-09-08 MED ORDER — ELETRIPTAN HYDROBROMIDE 20 MG PO TABS: 20.0000 mg | ORAL_TABLET | ORAL | 3 refills | Status: DC | PRN

## 2016-09-08 NOTE — Progress Notes (Signed)
   Subjective:    Patient ID: Kimberly Ray, female    DOB: 06-18-1951, 66 y.o.   MRN: [id number]  HPI  Patient reports that she is here for refills on her medications and also needs her FMLA papers filled back out for her depression and migraines.  She reports that her migraines are doing relatively well on the current medications she is taking.  She does still have the occasional migrainge which will last for greater than 24 hours that will incapacitate her.  She notes that she is taking her medications regularly.  Her depression is stable.  She is able to accomplish her regular daily tasks.  She does still struggle sometimes with increased appetite and fatigue.    She also notes that she has been having runny nose, congestion, cough, and fatigue.  She reports that this has been going on for the last couple weeks.     Review of Systems  Constitutional: Positive for fatigue. Negative for chills and fever.  HENT: Positive for congestion, ear pain, postnasal drip and sinus pressure. Negative for dental problem, drooling, sinus pain, sore throat, tinnitus and voice change.   Respiratory: Positive for cough. Negative for chest tightness, shortness of breath and wheezing.   Cardiovascular: Negative for chest pain and palpitations.       Objective:   Physical Exam  Constitutional: She is oriented to person, place, and time. She appears well-developed and well-nourished. No distress.  HENT:  Head: Normocephalic.  Nose: Mucosal edema present. Right sinus exhibits no maxillary sinus tenderness and no frontal sinus tenderness. Left sinus exhibits no maxillary sinus tenderness and no frontal sinus tenderness.  Mouth/Throat: Oropharynx is clear and moist. No oropharyngeal exudate.  Eyes: Conjunctivae are normal. No scleral icterus.  Neck: Normal range of motion. Neck supple. No JVD present. No thyromegaly present.  Cardiovascular: Normal rate, regular rhythm, normal heart sounds and intact distal  pulses.  Exam reveals no gallop and no friction rub.   No murmur heard. Pulmonary/Chest: Effort normal and breath sounds normal. No respiratory distress. She has no wheezes. She has no rales. She exhibits no tenderness.  Abdominal: Soft. Bowel sounds are normal. She exhibits no distension and no mass. There is no tenderness. There is no rebound and no guarding.  Musculoskeletal: Normal range of motion.  Lymphadenopathy:    She has no cervical adenopathy.  Neurological: She is alert and oriented to person, place, and time.  Skin: Skin is warm and dry. She is not diaphoretic.  Psychiatric: She has a normal mood and affect. Her behavior is normal. Judgment and thought content normal.  Nursing note and vitals reviewed.   Vitals:   09/08/16 1123  BP: 126/80  Pulse: 82  Temp: 98.2 F (36.8 C)         Assessment & Plan:    1. Attention deficit disorder, unspecified hyperactivity presence -refill given - methylphenidate (METADATE CD) 10 MG CR capsule; Take 1 capsule (10 mg total) by mouth 3 (three) times daily as needed.  Dispense: 90 capsule; Refill: 0  2. Other migraine without status migrainosus, not intractable -relpax refilled  3. Moderate episode of recurrent major depressive disorder (HCC) -cont meds -will fill out FMLA forms  4. Gastroesophageal reflux disease without esophagitis -cont omeprazole  -cont ranitidine -recommended buying omeprazole over the counter  5.  Acute URI -tessalon -prednisone -flonase -cont claritin -cont sudafed if desired -will call if she has fevers or colored sputum production.

## 2016-09-23 ENCOUNTER — Ambulatory Visit: Payer: Self-pay | Admitting: Internal Medicine

## 2016-10-10 ENCOUNTER — Other Ambulatory Visit: Payer: Self-pay | Admitting: Internal Medicine

## 2016-10-16 ENCOUNTER — Other Ambulatory Visit: Payer: Self-pay | Admitting: *Deleted

## 2016-10-16 MED ORDER — ELETRIPTAN HYDROBROMIDE 20 MG PO TABS: 20.0000 mg | ORAL_TABLET | ORAL | 3 refills | Status: DC | PRN

## 2016-10-16 MED ORDER — ELETRIPTAN HYDROBROMIDE 20 MG PO TABS: 20.0000 mg | ORAL_TABLET | ORAL | 0 refills | Status: DC | PRN

## 2016-10-29 ENCOUNTER — Telehealth: Payer: Self-pay | Admitting: *Deleted

## 2016-10-29 NOTE — Telephone Encounter (Signed)
Patient called stating she has not received her Relpax Rx. I called [facility] to verify if Rx was received and [facility] states they shipped the Rx 10/16/16 and was noted to be received 10/21/16. [facility] states patient has also tried to fill at [facility] [place]  Patient denies receiving Rx form [facility] and states the local pharmacy was unable to fill Rx.  Patient feels Rx must have been lost in the mail and is requesting we provide an override for [facility] to reship the Rx.  Per Starlyn Skeans, PA-C orders, we will provide an override this one time in case the Rx was lost in the mail.  [facility] will reship the Rx today and patient will receive her Relpax Rx within 2 days.

## 2016-12-08 NOTE — Progress Notes (Signed)
Assessment and Plan:  Attention deficit disorder, unspecified hyperactivity presence -cont medication -refill given - methylphenidate (METADATE CD) 10 MG CR capsule; Take 1 capsule (10 mg total) by mouth 3 (three) times daily as needed.  Dispense: 90 capsule; Refill: 0  Anemia, unspecified type -     Iron and TIBC -     Ferritin -     Vitamin B12  Medication management -     CBC with Differential/Platelet -     BASIC METABOLIC PANEL WITH GFR -     Magnesium  Fatigue, unspecified type Normal EKG,will check labs, if negative may be depression, follow up psych -     CBC with Differential/Platelet -     BASIC METABOLIC PANEL WITH GFR -     TSH -     Urinalysis, Routine w reflex microscopic -     EKG 12-Lead -     CK  Vitamin D deficiency -     VITAMIN D 25 Hydroxy (Vit-D Deficiency, Fractures)  Attention deficit disorder, unspecified hyperactivity presence -     methylphenidate (METADATE CD) 10 MG CR capsule; Take 1 capsule (10 mg total) by mouth 3 (three) times daily as needed.  Vertigo No focal symptoms, get on bASA Any worsening symptoms go to ER -     fluticasone (FLONASE) 50 MCG/ACT nasal spray; Place 2 sprays into both nostrils at bedtime. -     meclizine (ANTIVERT) 25 MG tablet; 1/2-1 pill up to 3 times daily for motion sickness/dizziness  The patient was advised to call immediately if she has any concerning symptoms in the interval. The patient voices understanding of current treatment options and is in agreement with the current care plan.The patient knows to call the clinic with any problems, questions or concerns or go to the ER if any further progression of symptoms. Has close follow up in June.   Future Appointments Date Time Provider [place]  01/20/2017 9:00 AM Vicie Mutters, PA-C [facility] None    HPI 66 y.o.female presents for follow-up of ADD and chol.    She reports that she is doing well.  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  She is taking breaks from medication. Patient reports that they have been doing well.  female is taking their medication.  They are not having difficulty with their medications.    She has had fatigue x 1-2 weeks, some allergy symptoms, some vertigo, some left arm numbness last night, no palpations, fever, chills, SOB, CP, changes in vision/speech.   Her blood pressure has been controlled at home, today their BP is BP: 110/76  She does workout. She denies chest pain, shortness of breath, dizziness.  She is on cholesterol medication and denies myalgias. Her cholesterol is at goal. The cholesterol last visit was:   Lab Results  Component Value Date   CHOL 190 06/12/2016   HDL 44 (L) 06/12/2016   LDLCALC 106 06/12/2016   TRIG 202 (H) 06/12/2016   CHOLHDL 4.3 06/12/2016    She has been working on diet and exercise for prediabetes with insulin resistance (as high as 160's), states MF helps her and would like to stay on it. , and denies paresthesia of the feet, polydipsia, polyuria and visual disturbances. Last A1C in the office was:  Lab Results  Component Value Date   HGBA1C 5.3 06/12/2016   Patient is on Vitamin D supplement.   Lab Results  Component Value Date   VD25OH  35 01/07/2016     BMI is Body mass index is 27.19 kg/m., she is working on diet and exercise. Wt Readings from Last 3 Encounters:  12/09/16 173 lb 9.6 oz (78.7 kg)  09/08/16 183 lb (83 kg)  08/07/16 175 lb (79.4 kg)     Past Medical History:  Diagnosis Date  . Anemia   . Depression   . GERD (gastroesophageal reflux disease)   . Hay fever   . Head trauma   . IBS (irritable bowel syndrome)   . Memory loss   . Migraines   . OSA on CPAP   . Reflux   . RLS (restless legs syndrome)   . Sinus congestion      Allergies  Allergen Reactions  . Erythromycin Other (See Comments)    TBS  . Ivp Dye [Iodinated Diagnostic Agents] Other (See Comments)    Hot flashes, itching scalp  . Penicillins  Hives  . Clarithromycin Other (See Comments)    Itching ankles  . Brintellix [Vortioxetine]     Itching  . Macrodantin [Nitrofurantoin]       Current Outpatient Prescriptions on File Prior to Visit  Medication Sig Dispense Refill  . albuterol (PROVENTIL HFA;VENTOLIN HFA) 108 (90 Base) MCG/ACT inhaler Use 1 to 2 inhalations 5 to 10 minutes apart every 4 hours if need too rescue Asthma 18 g 3  . buPROPion (WELLBUTRIN XL) 300 MG 24 hr tablet TAKE 1 TABLET DAILY FOR    DEPRESSION 90 tablet 3  . eletriptan (RELPAX) 20 MG tablet Take 1 tablet (20 mg total) by mouth as needed for migraine or headache. 1 po at onset of headache. May repeat in 2 hours if headache persists or recurs. 24 tablet 3  . FLUoxetine (PROZAC) 20 MG tablet TAKE 1 TABLET TWICE A DAY 180 tablet 1  . lamoTRIgine (LAMICTAL) 100 MG tablet Take 1 tablet (100 mg total) by mouth 2 (two) times daily. 180 tablet 1  . lamoTRIgine (LAMICTAL) 100 MG tablet TAKE 1 TABLET TWICE A DAY 180 tablet 1  . metFORMIN (GLUCOPHAGE XR) 500 MG 24 hr tablet Take 1 tablet (500 mg total) by mouth daily with breakfast. 90 tablet 2  . methylphenidate (METADATE CD) 10 MG CR capsule Take 1 capsule (10 mg total) by mouth 3 (three) times daily as needed. 90 capsule 0  . omeprazole (PRILOSEC) 40 MG capsule     . ondansetron (ZOFRAN-ODT) 8 MG disintegrating tablet PLACE 1 TABLET ON THE      TONGUE AND ALLOW TO        DISSOLVE EVERY 4 HOURS AS  NEEDED FOR NAUSEA 90 tablet 0  . pravastatin (PRAVACHOL) 40 MG tablet TAKE ONE TABLET BY MOUTH EVERY DAY IN THE EVENING 90 tablet 1  . ranitidine (ZANTAC) 300 MG tablet Take 1 tablet 2 x daily for acid indigestion & reflux 180 tablet 3  . sucralfate (CARAFATE) 1 g tablet Take 1 tablet (1 g total) by mouth 4 (four) times daily. 120 tablet 1   Current Facility-Administered Medications on File Prior to Visit  Medication Dose Route Frequency Provider Last Rate Last Dose  . ipratropium-albuterol (DUONEB) 0.5-2.5 (3) MG/3ML  nebulizer solution 3 mL  3 mL Nebulization Once [employer], PA-C        ROS: all negative except above.   Physical Exam: Filed Weights   12/09/16 1040  Weight: 173 lb 9.6 oz (78.7 kg)   BP 110/76   Pulse 85   Temp 97.3 F (36.3 C)  Resp 14   Ht 5\' 7"  (1.702 m)   Wt 173 lb 9.6 oz (78.7 kg)   SpO2 97%   BMI 27.19 kg/m  General Appearance: Well developed well nourished, non-toxic appearing in no apparent distress. Eyes: PERRLA, EOMs, conjunctiva w/ no swelling or erythema or discharge Sinuses: No Frontal/maxillary tenderness ENT/Mouth: Ear canals clear without swelling or erythema.  TM's normal bilaterally with no retractions, bulging, or loss of landmarks.   Neck: Supple, thyroid normal, no notable JVD  Respiratory: Respiratory effort normal, Clear breath sounds anteriorly and posteriorly bilaterally without rales, rhonchi, wheezing or stridor. No retractions or accessory muscle usage. Cardio: RRR with no MRGs.   Abdomen: Soft, + BS.  Non tender, no guarding, rebound, hernias, masses.  Musculoskeletal: Full ROM, 5/5 strength, normal gait.  Skin: Warm, dry without rashes  Neuro: Awake and oriented X 3, Cranial nerves intact. Normal muscle tone, no cerebellar symptoms. Sensation intact.  Psych: normal affect, Insight and Judgment appropriate.     Vicie Mutters, PA-C 10:50 AM [facility]

## 2016-12-09 ENCOUNTER — Ambulatory Visit (INDEPENDENT_AMBULATORY_CARE_PROVIDER_SITE_OTHER): Payer: Managed Care, Other (non HMO) | Admitting: Physician Assistant

## 2016-12-09 ENCOUNTER — Other Ambulatory Visit: Payer: Self-pay | Admitting: Orthopedic Surgery

## 2016-12-09 ENCOUNTER — Ambulatory Visit
Admission: RE | Admit: 2016-12-09 | Discharge: 2016-12-09 | Disposition: A | Discharge status: Home / Self Care | Payer: Managed Care, Other (non HMO) | Source: Ambulatory Visit | Attending: Orthopedic Surgery | Admitting: Orthopedic Surgery

## 2016-12-09 ENCOUNTER — Encounter: Payer: Self-pay | Admitting: Physician Assistant

## 2016-12-09 VITALS — BP 110/76 | HR 85 | Temp 97.3°F | Resp 14 | Ht 67.0 in | Wt 173.6 lb

## 2016-12-09 DIAGNOSIS — R5383 Other fatigue: Secondary | ICD-10-CM

## 2016-12-09 DIAGNOSIS — Z1231 Encounter for screening mammogram for malignant neoplasm of breast: Secondary | ICD-10-CM

## 2016-12-09 DIAGNOSIS — R030 Elevated blood-pressure reading, without diagnosis of hypertension: Secondary | ICD-10-CM

## 2016-12-09 DIAGNOSIS — E559 Vitamin D deficiency, unspecified: Secondary | ICD-10-CM | POA: Diagnosis not present

## 2016-12-09 DIAGNOSIS — Z79899 Other long term (current) drug therapy: Secondary | ICD-10-CM

## 2016-12-09 DIAGNOSIS — E782 Mixed hyperlipidemia: Secondary | ICD-10-CM

## 2016-12-09 DIAGNOSIS — D649 Anemia, unspecified: Secondary | ICD-10-CM | POA: Diagnosis not present

## 2016-12-09 DIAGNOSIS — F988 Other specified behavioral and emotional disorders with onset usually occurring in childhood and adolescence: Secondary | ICD-10-CM

## 2016-12-09 LAB — CBC WITH DIFFERENTIAL/PLATELET
BASOS ABS: 69 {cells}/uL (ref 0–200)
Basophils Relative: 1 %
EOS ABS: 69 {cells}/uL (ref 15–500)
EOS PCT: 1 %
HCT: 39.8 % (ref 35.0–45.0)
Hemoglobin: 12.9 g/dL (ref 11.7–15.5)
LYMPHS PCT: 27 %
Lymphs Abs: 1863 cells/uL (ref 850–3900)
MCH: 27.2 pg (ref 27.0–33.0)
MCHC: 32.4 g/dL (ref 32.0–36.0)
MCV: 84 fL (ref 80.0–100.0)
MONOS PCT: 10 %
MPV: 9.3 fL (ref 7.5–12.5)
Monocytes Absolute: 690 cells/uL (ref 200–950)
NEUTROS PCT: 61 %
Neutro Abs: 4209 cells/uL (ref 1500–7800)
PLATELETS: 320 10*3/uL (ref 140–400)
RBC: 4.74 MIL/uL (ref 3.80–5.10)
RDW: 13.9 % (ref 11.0–15.0)
WBC: 6.9 10*3/uL (ref 3.8–10.8)

## 2016-12-09 LAB — HEPATIC FUNCTION PANEL
ALBUMIN: 4.5 g/dL (ref 3.6–5.1)
ALT: 18 U/L (ref 6–29)
AST: 24 U/L (ref 10–35)
Alkaline Phosphatase: 89 U/L (ref 33–130)
BILIRUBIN DIRECT: 0.1 mg/dL (ref <=0.2)
BILIRUBIN TOTAL: 0.4 mg/dL (ref 0.2–1.2)
Indirect Bilirubin: 0.3 mg/dL (ref 0.2–1.2)
Total Protein: 6.9 g/dL (ref 6.1–8.1)

## 2016-12-09 LAB — BASIC METABOLIC PANEL WITH GFR
BUN: 16 mg/dL (ref 7–25)
CALCIUM: 9.7 mg/dL (ref 8.6–10.4)
CO2: 24 mmol/L (ref 20–31)
CREATININE: 1 mg/dL — AB (ref 0.50–0.99)
Chloride: 102 mmol/L (ref 98–110)
GFR, Est African American: 68 mL/min (ref >=60)
GFR, Est Non African American: 59 mL/min — ABNORMAL LOW (ref >=60)
Glucose, Bld: 87 mg/dL (ref 65–99)
Potassium: 4.3 mmol/L (ref 3.5–5.3)
SODIUM: 139 mmol/L (ref 135–146)

## 2016-12-09 LAB — IRON AND TIBC
%SAT: 13 % (ref 11–50)
Iron: 59 ug/dL (ref 45–160)
TIBC: 459 ug/dL — AB (ref 250–450)
UIBC: 400 ug/dL (ref 125–400)

## 2016-12-09 LAB — VITAMIN B12: VITAMIN B 12: 708 pg/mL (ref 200–1100)

## 2016-12-09 LAB — FERRITIN: Ferritin: 12 ng/mL — ABNORMAL LOW (ref 20–288)

## 2016-12-09 LAB — TSH: TSH: 1.89 m[IU]/L

## 2016-12-09 MED ORDER — METHYLPHENIDATE HCL ER (CD) 10 MG PO CPCR: 10.0000 mg | ORAL_CAPSULE | Freq: Three times a day (TID) | ORAL | 0 refills | Status: DC | PRN

## 2016-12-09 MED ORDER — FLUTICASONE PROPIONATE 50 MCG/ACT NA SUSP: 2.0000 | Freq: Every day | NASAL | 1 refills | Status: DC

## 2016-12-09 MED ORDER — MECLIZINE HCL 25 MG PO TABS: ORAL_TABLET | ORAL | 0 refills | Status: DC

## 2016-12-09 NOTE — Patient Instructions (Addendum)
Your ears and sinuses are connected by the eustachian tube. When your sinuses are inflamed, this can close off the tube and cause fluid to collect in your middle ear. This can then cause dizziness, popping, clicking, ringing, and echoing in your ears. This is often NOT an infection and does NOT require antibiotics, it is caused by inflammation so the treatments help the inflammation. This can take a long time to get better so please be patient.  Here are things you can do to help with this: - Try the Flonase or Nasonex. Remember to spray each nostril twice towards the outer part of your eye.  Do not sniff but instead pinch your nose and tilt your head back to help the medicine get into your sinuses.  The best time to do this is at bedtime.Stop if you get blurred vision or nose bleeds.  -While drinking fluids, pinch and hold nose close and swallow, to help open eustachian tubes to drain fluid behind ear drums. -Please pick one of the over the counter allergy medications below and take it once daily for allergies.  It will also help with fluid behind ear drums. Claritin or loratadine cheapest but likely the weakest  Zyrtec or certizine at night because it can make you sleepy The strongest is allegra or fexafinadine  Cheapest at [employer], [employer], [employer] -can use decongestant over the counter, please do not use if you have high blood pressure or certain heart conditions.   if worsening HA, changes vision/speech, imbalance, weakness go to the ER    Dizziness Dizziness is a common problem. It is a feeling of unsteadiness or light-headedness. You may feel like you are about to faint. Dizziness can lead to injury if you stumble or fall. Anyone can become dizzy, but dizziness is more common in older adults. This condition can be caused by a number of things, including medicines, dehydration, or illness. Follow these instructions at home: Taking these steps may help with your condition: Eating and  drinking  Drink enough fluid to keep your urine clear or pale yellow. This helps to keep you from becoming dehydrated. Try to drink more clear fluids, such as water.  Do not drink alcohol.  Limit your caffeine intake if directed by your health care provider.  Limit your salt intake if directed by your health care provider. Activity  Avoid making quick movements. ? Rise slowly from chairs and steady yourself until you feel okay. ? In the morning, first sit up on the side of the bed. When you feel okay, stand slowly while you hold onto something until you know that your balance is fine.  Move your legs often if you need to stand in one place for a long time. Tighten and relax your muscles in your legs while you are standing.  Do not drive or operate heavy machinery if you feel dizzy.  Avoid bending down if you feel dizzy. Place items in your home so that they are easy for you to reach without leaning over. Lifestyle  Do not use any tobacco products, including cigarettes, chewing tobacco, or electronic cigarettes. If you need help quitting, ask your health care provider.  Try to reduce your stress level, such as with yoga or meditation. Talk with your health care provider if you need help. General instructions  Watch your dizziness for any changes.  Take medicines only as directed by your health care provider. Talk with your health care provider if you think that your dizziness is caused by   a medicine that you are taking.  Tell a friend or a family member that you are feeling dizzy. If he or she notices any changes in your behavior, have this person call your health care provider.  Keep all follow-up visits as directed by your health care provider. This is important. Contact a health care provider if:  Your dizziness does not go away.  Your dizziness or light-headedness gets worse.  You feel nauseous.  You have reduced hearing.  You have new symptoms.  You are unsteady on  your feet or you feel like the room is spinning. Get help right away if:  You vomit or have diarrhea and are unable to eat or drink anything.  You have problems talking, walking, swallowing, or using your arms, hands, or legs.  You feel generally weak.  You are not thinking clearly or you have trouble forming sentences. It may take a friend or family member to notice this.  You have chest pain, abdominal pain, shortness of breath, or sweating.  Your vision changes.  You notice any bleeding.  You have a headache.  You have neck pain or a stiff neck.  You have a fever. This information is not intended to replace advice given to you by your health care provider. Make sure you discuss any questions you have with your health care provider. Document Released: 01/28/2001 Document Revised: 01/10/2016 Document Reviewed: 07/31/2014 Elsevier Interactive Patient Education  2017 [employer]vier Inc[employer]  

## 2016-12-10 ENCOUNTER — Other Ambulatory Visit: Payer: Self-pay | Admitting: Internal Medicine

## 2016-12-10 DIAGNOSIS — N63 Unspecified lump in unspecified breast: Secondary | ICD-10-CM

## 2016-12-10 LAB — URINALYSIS, ROUTINE W REFLEX MICROSCOPIC
BILIRUBIN URINE: NEGATIVE
GLUCOSE, UA: NEGATIVE
HGB URINE DIPSTICK: NEGATIVE
KETONES UR: NEGATIVE
Leukocytes, UA: NEGATIVE
Nitrite: NEGATIVE
PH: 6 (ref 5.0–8.0)
PROTEIN: NEGATIVE
Specific Gravity, Urine: 1.013 (ref 1.001–1.035)

## 2016-12-10 LAB — VITAMIN D 25 HYDROXY (VIT D DEFICIENCY, FRACTURES): VIT D 25 HYDROXY: 30 ng/mL (ref 30–100)

## 2016-12-10 LAB — MAGNESIUM: Magnesium: 2 mg/dL (ref 1.5–2.5)

## 2016-12-10 LAB — CK: CK TOTAL: 99 U/L (ref 7–177)

## 2016-12-11 ENCOUNTER — Ambulatory Visit
Admission: RE | Admit: 2016-12-11 | Discharge: 2016-12-11 | Disposition: A | Discharge status: Home / Self Care | Payer: Managed Care, Other (non HMO) | Source: Ambulatory Visit | Attending: Internal Medicine | Admitting: Internal Medicine

## 2016-12-11 ENCOUNTER — Other Ambulatory Visit: Payer: Self-pay | Admitting: Internal Medicine

## 2016-12-11 DIAGNOSIS — N63 Unspecified lump in unspecified breast: Secondary | ICD-10-CM

## 2016-12-11 NOTE — Progress Notes (Signed)
Pt aware of lab results & voiced understanding of those results.

## 2017-01-07 ENCOUNTER — Encounter: Payer: Self-pay | Admitting: Internal Medicine

## 2017-01-18 NOTE — Progress Notes (Signed)
Complete Physical  Assessment and Plan:   Encounter for general adult medical examination with abnormal findings  Hyperlipidemia -cont diet and exercise - Lipid panel   Hyperglycemia Discussed general issues about diabetes pathophysiology and management., Educational material distributed., Suggested low cholesterol diet., Encouraged aerobic exercise., Discussed foot care., Reminded to get yearly retinal exam. Stop metformin for now  Anemia, iron deficiency - monitor, continue iron supp with Vitamin C and increase green leafy veggies - Iron and TIBC - Vitamin B12   Essential hypertension - continue medications, DASH diet, exercise and monitor at home. Call if greater than 130/80.  - Urinalysis, Routine w reflex microscopic (not at [facility]) - Microalbumin / creatinine urine ratio - EKG 12-Lead - TSH   Vitamin D deficiency - VITAMIN D 25 Hydroxy (Vit-D Deficiency, Fractures)  ADD (attention deficit disorder) - methylphenidate (METADATE CD) 10 MG CR capsule; Take 1 capsule (10 mg total) by mouth 3 (three) times daily as needed.  Dispense: 90 capsule; Refill: 0  OSA on CPAP - not using due to not having mask/tubes, needs equipment Has fatigue, frequent awakening, needs to get back on it -     For home use only DME continuous positive airway pressure (CPAP)  Extrinsic asthma without complication, unspecified asthma severity, unspecified whether persistent Avoid triggers  Gastroesophageal reflux disease, esophagitis presence not specified Continue PPI/H2 blocker, diet discussed  RLS (restless legs syndrome) Get on iron, stop metformin, increase walking ? Need to switch to cymbalta for possible FM  Bipolar depression (HCC) Continue medications ? Need to switch to cymbalta for possible FM  Encounter for hepatitis C screening test for low risk patient -     Hepatitis C antibody  Discussed med's effects and SE's. Screening labs and tests as requested with regular follow-up as  recommended. Future Appointments Date Time Provider [place]  05/13/2017 2:30 PM Vicie Mutters, PA-C [facility] None  01/21/2018 9:00 AM Vicie Mutters, PA-C [facility] None     HPI  66 y.o. female  presents for a complete physical.  Her blood pressure has been controlled at home, today their BP is BP: 112/78.  She does not workout. She denies chest pain, shortness of breath, dizziness.  She is on lamictal and ADD medication for bipolar depression.  She is NOT on cholesterol medication, she stopped with pravastatin due to knee pain, states they are better.  Her cholesterol is not at goal. The cholesterol last visit was:  Lab Results  Component Value Date   CHOL 190 06/12/2016   HDL 44 (L) 06/12/2016   LDLCALC 106 06/12/2016   TRIG 202 (H) 06/12/2016   CHOLHDL 4.3 06/12/2016  . She has been working on diet and exercise for prediabetes, she is not on bASA, she is on ACE/ARB and denies foot ulcerations, hyperglycemia, hypoglycemia , increased appetite, nausea, paresthesia of the feet, polydipsia, polyuria, visual disturbances, vomiting and weight loss. Last A1C in the office was:  Lab Results  Component Value Date   HGBA1C 5.3 06/12/2016  She is not checking blood sugars.  She is taking 1 metformin daily.    Patient is on Vitamin D supplement.   Lab Results  Component Value Date   VD25OH 30 12/09/2016     She reports that she is taking the prilosec daily.  She did have an endoscopy.  She is also taking zantac also.  She has RLS, last iron was low, b12 normal, mag and CPK normal, started back on iron but states worsened GERD so will stop  for now.  Lab Results  Component Value Date   IRON 59 12/09/2016   TIBC 459 (H) 12/09/2016   FERRITIN 12 (L) 12/09/2016    BMI is Body mass index is 26.17 kg/m., she is working on diet and exercise. She has OSA sleep study 2-3 years ago, is not on CPAP x 1 year because need replacement parts. She has fatigue, gets up 2-3 x a night for  nocturia, HA, need to be back on it.  Wt Readings from Last 3 Encounters:  01/20/17 169 lb 9.6 oz (76.9 kg)  12/09/16 173 lb 9.6 oz (78.7 kg)  09/08/16 183 lb (83 kg)     Current Medications:  Current Outpatient Prescriptions on File Prior to Visit  Medication Sig  . buPROPion (WELLBUTRIN XL) 300 MG 24 hr tablet TAKE 1 TABLET DAILY FOR    DEPRESSION  . eletriptan (RELPAX) 20 MG tablet Take 1 tablet (20 mg total) by mouth as needed for migraine or headache. 1 po at onset of headache. May repeat in 2 hours if headache persists or recurs.  Marland Kitchen FLUoxetine (PROZAC) 20 MG tablet TAKE 1 TABLET TWICE A DAY  . fluticasone (FLONASE) 50 MCG/ACT nasal spray Place 2 sprays into both nostrils at bedtime.  . lamoTRIgine (LAMICTAL) 100 MG tablet Take 1 tablet (100 mg total) by mouth 2 (two) times daily.  Marland Kitchen lamoTRIgine (LAMICTAL) 100 MG tablet TAKE 1 TABLET TWICE A DAY  . meclizine (ANTIVERT) 25 MG tablet 1/2-1 pill up to 3 times daily for motion sickness/dizziness  . methylphenidate (METADATE CD) 10 MG CR capsule Take 1 capsule (10 mg total) by mouth 3 (three) times daily as needed.  Marland Kitchen omeprazole (PRILOSEC) 40 MG capsule   . pravastatin (PRAVACHOL) 40 MG tablet TAKE ONE TABLET BY MOUTH EVERY DAY IN THE EVENING  . albuterol (PROVENTIL HFA;VENTOLIN HFA) 108 (90 Base) MCG/ACT inhaler Use 1 to 2 inhalations 5 to 10 minutes apart every 4 hours if need too rescue Asthma  . ranitidine (ZANTAC) 300 MG tablet Take 1 tablet 2 x daily for acid indigestion & reflux   Current Facility-Administered Medications on File Prior to Visit  Medication  . ipratropium-albuterol (DUONEB) 0.5-2.5 (3) MG/3ML nebulizer solution 3 mL    Health Maintenance:   Immunization History  Administered Date(s) Administered  . Influenza, High Dose Seasonal PF 06/12/2016  . Influenza-Unspecified 06/02/2015  . PPD Test 08/19/2011, 08/30/2013, 11/09/2014  . Pneumococcal Conjugate-13 08/18/2009  . Pneumococcal Polysaccharide-23 06/12/2016   . Td 08/19/2003  . Tdap 08/30/2013  . Zoster 08/18/2010    Tetanus: 2015 Pneumovax: 2017 Prevnar: 2011 Flu vaccine: 2017 Zostavax: 2012  MGM: 11/2016  DEXA: 2015 Colonoscopy: 2013 EGD: 2017 Last Dental Exam: Dr. Tama Gander, twice yearly Last Eye Exam: Dr. Marica Otter, yearly  Patient Care Team: Unk Pinto, MD as PCP - General (Internal Medicine)   Medical History:  Past Medical History:  Diagnosis Date  . Anemia   . Depression   . GERD (gastroesophageal reflux disease)   . Hay fever   . Head trauma   . IBS (irritable bowel syndrome)   . Memory loss   . Migraines   . OSA on CPAP   . Reflux   . RLS (restless legs syndrome)   . Sinus congestion     Allergies Allergies  Allergen Reactions  . Erythromycin Other (See Comments)    TBS  . Ivp Dye [Iodinated Diagnostic Agents] Other (See Comments)    Hot flashes, itching scalp  . Penicillins  Hives  . Clarithromycin Other (See Comments)    Itching ankles  . Brintellix [Vortioxetine]     Itching  . Macrodantin [Nitrofurantoin]     SURGICAL HISTORY She  has a past surgical history that includes perforated eardrum; Nasal sinus surgery; Tonsillectomy and adenoidectomy; kidney stone removed; and Breast biopsy (Right, 02/27/2014). FAMILY HISTORY Her family history includes Breast cancer in her cousin; Breast cancer (age of onset: 29) in her maternal aunt; Breast cancer (age of onset: 51) in her maternal grandmother; Diabetes in her mother and sister. SOCIAL HISTORY She  reports that she has never smoked. She has never used smokeless tobacco. She reports that she drinks about 0.6 oz of alcohol per week . She reports that she does not use drugs.  Review of Systems: Review of Systems  Constitutional: Positive for malaise/fatigue. Negative for chills and fever.  HENT: Negative for congestion, ear pain and sore throat.   Respiratory: Negative for cough, shortness of breath and wheezing.   Cardiovascular: Negative  for chest pain, palpitations and leg swelling.  Gastrointestinal: Positive for heartburn. Negative for abdominal pain, blood in stool, constipation, diarrhea and melena.  Genitourinary: Negative.   Neurological: Negative for dizziness, loss of consciousness and headaches (occasional migraines).  Psychiatric/Behavioral: Negative for depression. The patient is nervous/anxious. The patient does not have insomnia.     Physical Exam: Estimated body mass index is 26.17 kg/m as calculated from the following:   Height as of this encounter: 5' 7.5" (1.715 m).   Weight as of this encounter: 169 lb 9.6 oz (76.9 kg). BP 112/78   Pulse 74   Temp 97.5 F (36.4 C)   Resp 16   Ht 5' 7.5" (1.715 m)   Wt 169 lb 9.6 oz (76.9 kg)   SpO2 97%   BMI 26.17 kg/m   Wt Readings from Last 3 Encounters:  01/20/17 169 lb 9.6 oz (76.9 kg)  12/09/16 173 lb 9.6 oz (78.7 kg)  09/08/16 183 lb (83 kg)    General Appearance: Well nourished well developed, in no apparent distress.  Eyes: PERRLA, EOMs, conjunctiva no swelling or erythema ENT/Mouth: Ear canals normal without obstruction, swelling, erythema, or discharge.  TMs normal bilaterally with no erythema, bulging, retraction, or loss of landmark.  Oropharynx moist and clear with no exudate, erythema, or swelling.   Neck: Supple, thyroid normal. No bruits.  No cervical adenopathy Respiratory: Respiratory effort normal, Breath sounds clear A&P without wheeze, rhonchi, rales.   Cardio: RRR without murmurs, rubs or gallops. Brisk peripheral pulses without edema.  Chest: symmetric, with normal excursions Breasts: Symmetric, without lumps, nipple discharge, retractions.  Abdomen: Soft, nontender, no guarding, rebound, hernias, masses, or organomegaly.  Lymphatics: Non tender without lymphadenopathy.   Musculoskeletal: Full ROM all peripheral extremities,5/5 strength, and normal gait.  Skin: Warm, dry without rashes, lesions, ecchymosis. Neuro: Awake and oriented X  3, Cranial nerves intact, reflexes equal bilaterally. Normal muscle tone, no cerebellar symptoms. Sensation intact.  Psych:  normal affect, Insight and Judgment appropriate.   EKG: WNL no changes.  Over 40 minutes of exam, counseling, chart review and critical decision making was performed  Vicie Mutters 9:35 AM [facility]

## 2017-01-20 ENCOUNTER — Other Ambulatory Visit: Payer: Self-pay

## 2017-01-20 ENCOUNTER — Ambulatory Visit (INDEPENDENT_AMBULATORY_CARE_PROVIDER_SITE_OTHER): Payer: Managed Care, Other (non HMO) | Admitting: Physician Assistant

## 2017-01-20 ENCOUNTER — Encounter: Payer: Self-pay | Admitting: Physician Assistant

## 2017-01-20 ENCOUNTER — Encounter: Payer: Self-pay | Admitting: Internal Medicine

## 2017-01-20 VITALS — BP 112/78 | HR 74 | Temp 97.5°F | Resp 16 | Ht 67.5 in | Wt 169.6 lb

## 2017-01-20 DIAGNOSIS — R6889 Other general symptoms and signs: Secondary | ICD-10-CM | POA: Diagnosis not present

## 2017-01-20 DIAGNOSIS — R030 Elevated blood-pressure reading, without diagnosis of hypertension: Secondary | ICD-10-CM

## 2017-01-20 DIAGNOSIS — R7309 Other abnormal glucose: Secondary | ICD-10-CM | POA: Diagnosis not present

## 2017-01-20 DIAGNOSIS — Z1159 Encounter for screening for other viral diseases: Secondary | ICD-10-CM

## 2017-01-20 DIAGNOSIS — Z0001 Encounter for general adult medical examination with abnormal findings: Secondary | ICD-10-CM

## 2017-01-20 DIAGNOSIS — G2581 Restless legs syndrome: Secondary | ICD-10-CM

## 2017-01-20 DIAGNOSIS — G4733 Obstructive sleep apnea (adult) (pediatric): Secondary | ICD-10-CM

## 2017-01-20 DIAGNOSIS — Z79899 Other long term (current) drug therapy: Secondary | ICD-10-CM

## 2017-01-20 DIAGNOSIS — E559 Vitamin D deficiency, unspecified: Secondary | ICD-10-CM

## 2017-01-20 DIAGNOSIS — K219 Gastro-esophageal reflux disease without esophagitis: Secondary | ICD-10-CM

## 2017-01-20 DIAGNOSIS — F3130 Bipolar disorder, current episode depressed, mild or moderate severity, unspecified: Secondary | ICD-10-CM | POA: Diagnosis not present

## 2017-01-20 DIAGNOSIS — F988 Other specified behavioral and emotional disorders with onset usually occurring in childhood and adolescence: Secondary | ICD-10-CM | POA: Diagnosis not present

## 2017-01-20 DIAGNOSIS — Z9989 Dependence on other enabling machines and devices: Secondary | ICD-10-CM

## 2017-01-20 DIAGNOSIS — E782 Mixed hyperlipidemia: Secondary | ICD-10-CM

## 2017-01-20 DIAGNOSIS — D649 Anemia, unspecified: Secondary | ICD-10-CM

## 2017-01-20 DIAGNOSIS — J45909 Unspecified asthma, uncomplicated: Secondary | ICD-10-CM | POA: Diagnosis not present

## 2017-01-20 DIAGNOSIS — F319 Bipolar disorder, unspecified: Secondary | ICD-10-CM

## 2017-01-20 LAB — CBC WITH DIFFERENTIAL/PLATELET
BASOS PCT: 1 %
Basophils Absolute: 64 cells/uL (ref 0–200)
EOS PCT: 2 %
Eosinophils Absolute: 128 cells/uL (ref 15–500)
HCT: 39.4 % (ref 35.0–45.0)
HEMOGLOBIN: 12.6 g/dL (ref 11.7–15.5)
LYMPHS ABS: 1664 {cells}/uL (ref 850–3900)
Lymphocytes Relative: 26 %
MCH: 27.2 pg (ref 27.0–33.0)
MCHC: 32 g/dL (ref 32.0–36.0)
MCV: 85.1 fL (ref 80.0–100.0)
MONOS PCT: 9 %
MPV: 9.2 fL (ref 7.5–12.5)
Monocytes Absolute: 576 cells/uL (ref 200–950)
Neutro Abs: 3968 cells/uL (ref 1500–7800)
Neutrophils Relative %: 62 %
PLATELETS: 343 10*3/uL (ref 140–400)
RBC: 4.63 MIL/uL (ref 3.80–5.10)
RDW: 14.1 % (ref 11.0–15.0)
WBC: 6.4 10*3/uL (ref 3.8–10.8)

## 2017-01-20 LAB — HEPATITIS C ANTIBODY: HCV AB: NEGATIVE

## 2017-01-20 MED ORDER — ONDANSETRON 8 MG PO TBDP: ORAL_TABLET | ORAL | 0 refills | Status: DC

## 2017-01-20 MED ORDER — METFORMIN HCL ER 500 MG PO TB24: 500.0000 mg | ORAL_TABLET | Freq: Every day | ORAL | 2 refills | Status: DC

## 2017-01-20 MED ORDER — METHYLPHENIDATE HCL ER (CD) 10 MG PO CPCR: 10.0000 mg | ORAL_CAPSULE | Freq: Three times a day (TID) | ORAL | 0 refills | Status: DC | PRN

## 2017-01-20 MED ORDER — SUCRALFATE 1 G PO TABS: 1.0000 g | ORAL_TABLET | Freq: Four times a day (QID) | ORAL | 1 refills | Status: DC

## 2017-01-20 NOTE — Patient Instructions (Addendum)
    Simple math prevails.    1st - exercise does not produce significant weight loss - at best one converts fat into muscle , "bulks up", loses inches, but usually stays "weight neutral"     2nd - think of your body weightas a check book: If you eat more calories than you burn up - you save money or gain weight .... Or if you spend more money than you put in the check book, ie burn up more calories than you eat, then you lose weight     3rd - if you walk or run 1 mile, you burn up 100 calories - you have to burn up 3,500 calories to lose 1 pound, ie you have to walk/run 35 miles to lose 1 measly pound. So if you want to lose 10 #, then you have to walk/run 350 miles, so.... clearly exercise is not the solution.     4. So if you consume 1,500 calories, then you have to burn up the equivalent of 15 miles to stay weight neutral - It also stands to reason that if you consume 1,500 cal/day and don't lose weight, then you must be burning up about 1,500 cals/day to stay weight neutral.     5. If you really want to lose weight, you must cut your calorie intake 300 calories /day and at that rate you should lose about 1 # every 3 days.   6. Please purchase Dr Fara Olden Fuhrman's book(s) "The End of Dieting" & "Eat to Live" . It has some great concepts and recipes.     Stop the metformin Start the iron   Please increase green leafy veggies, take an iron supplement with vitamin C at the same time to increase absorption. Take the iron daily or 3 times a week, it can cause constipation or black stool. We will monitor this closely.

## 2017-01-21 LAB — HEPATIC FUNCTION PANEL
ALT: 21 U/L (ref 6–29)
AST: 25 U/L (ref 10–35)
Albumin: 4.2 g/dL (ref 3.6–5.1)
Alkaline Phosphatase: 86 U/L (ref 33–130)
BILIRUBIN DIRECT: 0.1 mg/dL (ref <=0.2)
BILIRUBIN INDIRECT: 0.2 mg/dL (ref 0.2–1.2)
BILIRUBIN TOTAL: 0.3 mg/dL (ref 0.2–1.2)
Total Protein: 6.6 g/dL (ref 6.1–8.1)

## 2017-01-21 LAB — LIPID PANEL
CHOL/HDL RATIO: 3.9 ratio (ref <5.0)
CHOLESTEROL: 182 mg/dL (ref <200)
HDL: 47 mg/dL — AB (ref >50)
LDL Cholesterol: 74 mg/dL (ref <100)
Triglycerides: 303 mg/dL — ABNORMAL HIGH (ref <150)
VLDL: 61 mg/dL — AB (ref <30)

## 2017-01-21 LAB — URINALYSIS, ROUTINE W REFLEX MICROSCOPIC
BILIRUBIN URINE: NEGATIVE
GLUCOSE, UA: NEGATIVE
Hgb urine dipstick: NEGATIVE
KETONES UR: NEGATIVE
Leukocytes, UA: NEGATIVE
Nitrite: NEGATIVE
Protein, ur: NEGATIVE
SPECIFIC GRAVITY, URINE: 1.016 (ref 1.001–1.035)
pH: 6 (ref 5.0–8.0)

## 2017-01-21 LAB — BASIC METABOLIC PANEL WITH GFR
BUN: 11 mg/dL (ref 7–25)
CHLORIDE: 105 mmol/L (ref 98–110)
CO2: 24 mmol/L (ref 20–31)
CREATININE: 0.83 mg/dL (ref 0.50–0.99)
Calcium: 9.4 mg/dL (ref 8.6–10.4)
GFR, EST NON AFRICAN AMERICAN: 74 mL/min (ref >=60)
GFR, Est African American: 85 mL/min (ref >=60)
Glucose, Bld: 88 mg/dL (ref 65–99)
POTASSIUM: 4.3 mmol/L (ref 3.5–5.3)
SODIUM: 141 mmol/L (ref 135–146)

## 2017-01-21 LAB — IRON AND TIBC
%SAT: 9 % — AB (ref 11–50)
IRON: 37 ug/dL — AB (ref 45–160)
TIBC: 417 ug/dL (ref 250–450)
UIBC: 380 ug/dL

## 2017-01-21 LAB — MICROALBUMIN / CREATININE URINE RATIO
Creatinine, Urine: 109 mg/dL (ref 20–320)
Microalb Creat Ratio: 6 mcg/mg creat (ref <30)
Microalb, Ur: 0.7 mg/dL

## 2017-01-21 LAB — TSH: TSH: 2.04 m[IU]/L

## 2017-01-21 LAB — MAGNESIUM: MAGNESIUM: 1.9 mg/dL (ref 1.5–2.5)

## 2017-01-23 ENCOUNTER — Other Ambulatory Visit: Payer: Self-pay | Admitting: Internal Medicine

## 2017-01-23 LAB — LAMOTRIGINE LEVEL: Lamotrigine Lvl: 1.7 ug/mL — ABNORMAL LOW (ref 4.0–18.0)

## 2017-01-23 MED ORDER — NEOMYCIN-POLYMYXIN-DEXAMETH 3.5-10000-0.1 OP SUSP: OPHTHALMIC | 1 refills | Status: DC

## 2017-01-23 NOTE — Progress Notes (Signed)
Pt aware of lab results & voiced understanding of those results.

## 2017-01-23 NOTE — Progress Notes (Signed)
LVM for pt to return office call for LAB results.

## 2017-02-13 ENCOUNTER — Other Ambulatory Visit: Payer: Self-pay | Admitting: Physician Assistant

## 2017-02-17 ENCOUNTER — Institutional Professional Consult (permissible substitution): Payer: Managed Care, Other (non HMO) | Admitting: Neurology

## 2017-03-25 ENCOUNTER — Ambulatory Visit (INDEPENDENT_AMBULATORY_CARE_PROVIDER_SITE_OTHER): Payer: Managed Care, Other (non HMO) | Admitting: Neurology

## 2017-03-25 ENCOUNTER — Encounter (INDEPENDENT_AMBULATORY_CARE_PROVIDER_SITE_OTHER): Payer: Self-pay

## 2017-03-25 ENCOUNTER — Encounter: Payer: Self-pay | Admitting: Neurology

## 2017-03-25 VITALS — BP 123/77 | HR 70 | Ht 67.0 in | Wt 172.0 lb

## 2017-03-25 DIAGNOSIS — G43011 Migraine without aura, intractable, with status migrainosus: Secondary | ICD-10-CM

## 2017-03-25 DIAGNOSIS — R0683 Snoring: Secondary | ICD-10-CM | POA: Diagnosis not present

## 2017-03-25 DIAGNOSIS — G43909 Migraine, unspecified, not intractable, without status migrainosus: Secondary | ICD-10-CM | POA: Insufficient documentation

## 2017-03-25 DIAGNOSIS — G4761 Periodic limb movement disorder: Secondary | ICD-10-CM | POA: Diagnosis not present

## 2017-03-25 DIAGNOSIS — G4719 Other hypersomnia: Secondary | ICD-10-CM | POA: Diagnosis not present

## 2017-03-25 DIAGNOSIS — G4720 Circadian rhythm sleep disorder, unspecified type: Secondary | ICD-10-CM

## 2017-03-25 NOTE — Progress Notes (Signed)
SLEEP MEDICINE CLINIC   Provider:  Larey Ray, M D  Primary Care Physician:  Kimberly Pinto, MD   Referring Provider: Unk Pinto, MD   Chief Complaint  Patient presents with  . New Patient (Initial Visit)    cpap user for at least 5 years, not been using it due to needing new supplies, last sleep study  also about  years ago.Marland Kitchen PA explained the benefits of using the CPAP like she is suppose to and the patient would like to get back to being complaint and get new supplies. pt is a Catering manager.    HPI:  Kimberly Ray is a 66 y.o. female , seen here as in a referral/ revisit  from Dr. Melford Ray for a new evaluation of CPAP.   [name] had been evaluated at the [facility]  center in the year 2012, she stated that she did not have follow-up was placed on a CPAP and was basically followed by the medical equipment company for supplies.  She had actually to sleep studies in the past the first one did not find enough apnea to be treated were the, but PLMS and restless legs were documented. In the meantime her sleep habits have changed and her concerns, also. 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 When she is on a " dead head" trip by plane she can sleep, in theory ,but has been asked by passengers if she had RLS ?   She carries a diagnosis of asthma, which has followed bronchitic infections in the past but is not frequent. She also takes methylphenidate for ADD which is really used for daytime sleepiness. She has carried a diagnosis of GERD continues to use a PPI, restless legs started getting on iron supplement, increased her walking.   Seasonal depression . Hep C ab positive. she will have regular screening labs with Vicie Mutters yearly. Labs were reviewed - normal HbA1c.   Sleep habits are as follows: The patient's intended bedtime is before 10 PM but due to her work requirements  and residing between [place] and [place] she does have some irregularities. She can fall asleep within minutes she states, she can stay asleep for a period of several hours, and if she wouldn't have a bathroom break in between she would sleep uninterrupted sleep. She has no trouble sleeping for 12 hours if the opportunity arises. Her restless legs do not keep her from falling asleep. They are more PLMS evident while she sleeping. Her rise time is between 3 and 5 AM depending on which flight she has to attend to. 6 hours of nocturnal sleep. If she has some opportunity to sleep in daytime , she will do so and feels refreshed.    Sleep medical history and family sleep history: brother has seasonal depression, father is 75 - has RSL and apnea. Mother is healthy 69.  The patient has a light therapy device at home which helps with seasonal depression, it also helps her to fly into [place] destinations in Winter.   Social history: The patient is single, working and resides in 2 locations due to work. Her more active social life and family life is here [place]. She has 2 grown sons. She is not a tobacco user, she seldomly drinks alcohol, and very rarely drinks caffeine in form of coffee, ice tea or soda.   Review of Systems: Out of  a complete 14 system review, the patient complains of only the following symptoms, and all other reviewed systems are negative. Skin itching, no burning feet, pin or needles.   Epworth score 20 , Fatigue severity score 50 , depression score 2/1 5   How likely are you to doze in the following situations: 0 = not likely, 1 = slight chance, 2 = moderate chance, 3 = high chance  Sitting and Reading? 3 Watching Television?  3 Sitting inactive in a public place (theater or meeting)? 3 As a passenger in a car for an hour without a break? 3  Lying down in the afternoon when circumstances permit? 3 Sitting and talking to someone?2 Sitting quietly after lunch  without alcohol? 3 In a car, while stopped for a few minutes in traffic? 1  Total = 20    Social History   Social History  . Marital status: Divorced    Spouse name: N/A  . Number of children: 2  . Years of education: 13   Occupational History  .  [employer] Attendant   Social History Main Topics  . Smoking status: Never Smoker  . Smokeless tobacco: Never Used  . Alcohol use 0.6 oz/week    1 Shots of liquor per week     Comment: Once a year  . Drug use: No  . Sexual activity: Not on file   Other Topics Concern  . Not on file   Social History Narrative   Patient is single and lives at home alone. Patient is flight attendant. Patient has college education one year      Right handed.   Caffeine- Rare.     Family History  Problem Relation Age of Onset  . Diabetes Mother   . Diabetes Sister   . Breast cancer Maternal Grandmother 60  . Breast cancer Maternal Aunt 64  . Breast cancer Cousin     Past Medical History:  Diagnosis Date  . Anemia   . Depression   . GERD (gastroesophageal reflux disease)   . Hay fever   . Head trauma   . IBS (irritable bowel syndrome)   . Memory loss   . Migraines   . OSA on CPAP   . Reflux   . RLS (restless legs syndrome)   . Sinus congestion     Past Surgical History:  Procedure Laterality Date  . BREAST BIOPSY Right 02/27/2014   Stereo- Benign  . kidney stone removed    . NASAL SINUS SURGERY    . perforated eardrum     left side  . TONSILLECTOMY AND ADENOIDECTOMY      Current Outpatient Prescriptions  Medication Sig Dispense Refill  . buPROPion (WELLBUTRIN XL) 300 MG 24 hr tablet TAKE 1 TABLET DAILY FOR    DEPRESSION 90 tablet 3  . eletriptan (RELPAX) 20 MG tablet Take 1 tablet (20 mg total) by mouth as needed for migraine or headache. 1 po at onset of headache. May repeat in 2 hours if headache persists or recurs. 24 tablet 3  . FLUoxetine (PROZAC) 20 MG tablet TAKE 1 TABLET TWICE A DAY 180 tablet 1    . fluticasone (FLONASE) 50 MCG/ACT nasal spray PLACE 2 SPRAYS INTO BOTH NOSTRILS AT BEDTIME. 16 g 1  . lamoTRIgine (LAMICTAL) 100 MG tablet Take 1 tablet (100 mg total) by mouth 2 (two) times daily. 180 tablet 1  . lamoTRIgine (LAMICTAL) 100 MG tablet TAKE 1 TABLET TWICE A DAY 180 tablet 1  .  meclizine (ANTIVERT) 25 MG tablet 1/2-1 pill up to 3 times daily for motion sickness/dizziness 30 tablet 0  . methylphenidate (METADATE CD) 10 MG CR capsule Take 1 capsule (10 mg total) by mouth 3 (three) times daily as needed. 90 capsule 0  . neomycin-polymyxin b-dexamethasone (MAXITROL) 3.5-10000-0.1 SUSP 1 to 1 drops to the affected eye every 1 to 2 hours today, then 4 x /day 5 mL 1  . omeprazole (PRILOSEC) 40 MG capsule     . ondansetron (ZOFRAN-ODT) 8 MG disintegrating tablet PLACE 1 TABLET ON THE      TONGUE AND ALLOW TO        DISSOLVE EVERY 4 HOURS AS  NEEDED FOR NAUSEA 90 tablet 0  . pravastatin (PRAVACHOL) 40 MG tablet TAKE ONE TABLET BY MOUTH EVERY DAY IN THE EVENING 90 tablet 1  . sucralfate (CARAFATE) 1 g tablet Take 1 tablet (1 g total) by mouth 4 (four) times daily. 120 tablet 1  . albuterol (PROVENTIL HFA;VENTOLIN HFA) 108 (90 Base) MCG/ACT inhaler Use 1 to 2 inhalations 5 to 10 minutes apart every 4 hours if need too rescue Asthma 18 g 3  . ranitidine (ZANTAC) 300 MG tablet Take 1 tablet 2 x daily for acid indigestion & reflux 180 tablet 3   Current Facility-Administered Medications  Medication Dose Route Frequency Provider Last Rate Last Dose  . ipratropium-albuterol (DUONEB) 0.5-2.5 (3) MG/3ML nebulizer solution 3 mL  3 mL Nebulization Once Forcucci, Courtney, PA-C        Allergies as of 03/25/2017 - Review Complete 01/20/2017  Allergen Reaction Noted  . Erythromycin Other (See Comments) 09/23/2011  . Ivp dye [iodinated diagnostic agents] Other (See Comments) 09/23/2011  . Penicillins Hives 09/23/2011  . Clarithromycin Other (See Comments) 09/23/2011  . Brintellix [vortioxetine]   05/08/2014  . Macrodantin [nitrofurantoin]  06/28/2013    Vitals: BP 123/77   Pulse 70   Ht 5\' 7"  (1.702 m)   Wt 172 lb (78 kg)   BMI 26.94 kg/m  Last Weight:  Wt Readings from Last 1 Encounters:  03/25/17 172 lb (78 kg)   CXK:GYJE mass index is 26.94 kg/m.     Last Height:   Ht Readings from Last 1 Encounters:  03/25/17 5\' 7"  (1.702 m)    Physical exam:  General: The patient is awake, alert and appears not in acute distress. The patient is well groomed. Head: Normocephalic, atraumatic. Neck is supple. Mallampati 1-2 neck circumference:  15 . Nasal airflow patent ,  Retrognathia is not seen.  Cardiovascular:  Regular rate and rhythm , without  murmurs or carotid bruit, and without distended neck veins. Respiratory: Lungs are clear to auscultation. Skin:  Without evidence of edema, or rash. Trunk: BMI is 27. The patient's posture is erect    Neurologic exam :The patient is awake and alert, oriented to place and time.   Memory subjective  described as intact.  Attention span & concentration ability appears normal.  Speech is fluent,  without dysarthria or aphasia.  Mood and affect are appropriate.  Cranial nerves:Pupils are equal and briskly reactive to light. Extraocular movements  in vertical and horizontal planes intact and without nystagmus. Visual fields by finger perimetry are intact. Hearing to finger rub intact. Facial sensation intact to fine touch.Facial motor strength is symmetric and tongue and uvula move midline. Shoulder shrug was symmetrical.  Motor exam:   Normal tone, muscle bulk and symmetric strength in all extremities. Sensory:  Fine touch, pinprick and vibration were  normal.  Coordination: Rapid alternating movements in the fingers/hands was normal. Finger-to-nose maneuver  normal without evidence of ataxia, dysmetria or tremor. Gait and station: Patient walks without assistive device . Deep tendon reflexes: in the  upper and lower extremities are symmetric  and intact. Babinski maneuver response is downgoing.    Assessment:  After physical and neurologic examination, review of laboratory studies,  Personal review of imaging studies, reports of other /same  Imaging studies, results of polysomnography and / or neurophysiology testing and pre-existing records as far as provided in visit., my assessment is   1)   I will be happy to retest Mrs. Wilshire for the presence of sleep apnea, she has not been using CPAP recently as she has not been able to get new equipment. Her degree of daytime sleepiness aside from seasonal depression, is still very high. I do think that she has implemented good sleep hygiene as good as a person with her work requirements and schedule can be.  2) excessive daytime sleepiness. stimulant use Also she takes methylphenidate in the generic form of Ritalin to suppress daytime sleepiness she has been remaining excessively daytime sleepy. We will see how her Epworth sleepiness score response to the reinitiation of CPAP therapy should she be tested positive for apnea.  3)I do not suspect that the patient has hypoxemia she does not wake up with headaches,  4)She has a well-documented history of periodic limb movement disorder rather than actual restless legs. Her restless legs do not keep her from falling asleep she does still have  the irresistible urge to move while awake- PLM overlap with RLS>   5) Circadian rhythm , shift disorder.   6) seasonal depression, on light therapy   The patient was advised of the nature of the diagnosed disorder , the treatment options and the  risks for general health and wellness arising from not treating the condition.   I spent more than 50 minutes of face to face time with the patient.  Greater than 50% of time was spent in counseling and coordination of care. We have discussed the diagnosis and differential and I answered the patient's questions.    Plan:  Treatment plan and additional workup :     Mrs. Doubleday is insured through [employer] that do not does not permit in lab sleep studies. Our sleep study however is not meant to just simply check if the patient still has apnea or not, but also to confirm if she still has periodic limb movements, and document her circadian rhythm shift. The latter is necessary to allow her to use a stimulant on a medical prescription reason.  The patient will undergo an attended sleep study with possible SPLIT, but we need to monitor for PLMs.  Will arrange for a night time study.     Kimberly Seat, MD 0/10/90, 3:30 AM  Certified in Neurology by ABPN Certified in [place] by [facility] [address], [place], [place] [address]

## 2017-04-22 ENCOUNTER — Ambulatory Visit (INDEPENDENT_AMBULATORY_CARE_PROVIDER_SITE_OTHER): Payer: Managed Care, Other (non HMO) | Admitting: Neurology

## 2017-04-22 DIAGNOSIS — G4720 Circadian rhythm sleep disorder, unspecified type: Secondary | ICD-10-CM

## 2017-04-22 DIAGNOSIS — G43011 Migraine without aura, intractable, with status migrainosus: Secondary | ICD-10-CM

## 2017-04-22 DIAGNOSIS — G4761 Periodic limb movement disorder: Secondary | ICD-10-CM | POA: Diagnosis not present

## 2017-04-22 DIAGNOSIS — R0683 Snoring: Secondary | ICD-10-CM

## 2017-04-22 DIAGNOSIS — G4719 Other hypersomnia: Secondary | ICD-10-CM

## 2017-04-23 ENCOUNTER — Telehealth: Payer: Self-pay | Admitting: Neurology

## 2017-04-23 ENCOUNTER — Other Ambulatory Visit: Payer: Self-pay | Admitting: Neurology

## 2017-04-23 MED ORDER — ROPINIROLE HCL 0.25 MG PO TABS: 0.2500 mg | ORAL_TABLET | Freq: Every day | ORAL | 3 refills | Status: DC

## 2017-04-23 MED ORDER — ALPRAZOLAM 0.5 MG PO TABS: 0.5000 mg | ORAL_TABLET | Freq: Every evening | ORAL | 0 refills | Status: DC | PRN

## 2017-04-23 NOTE — Telephone Encounter (Signed)
Pt returned call and I was able to discuss the sleep study results. I advised the pt that per Dr Dohmeier's recommendations she is needing the patient to come in for a CPAP titration study. Pt verbalized understanding and agreed. 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 I confirmed the place and resent the medication to that pharmacy as well as contacted the [facility] where was first called into to have them cancel it. I informed the pt that Dr Brett Fairy would like to have the patient take xanax the night of her sleep study. She didn't want her to continue on it just to use it the night of and she was appreciative to that. [facility] on [address] so that she could pick up prior to when she schedules her sleep study. Pt verbalized understanding and had no further questions and will await call from the sleep lab

## 2017-04-23 NOTE — Telephone Encounter (Signed)
-----   Message from Larey Seat, MD sent at 04/23/2017  8:38 AM EDT ----- It was not a good night for Kimberly Ray - she slept poorly, only 63% of the recorded time, but enough to make a diagnosis.  . The diagnosis of apnea was confirmed and her apnea is very REM sleep dependent.  Most arousals were related to PLMs.- severe periodic limb movements.   I would like to start on a medication for PLMs and have the patient return for CPAP titration. I will also provide Xanax as a sleep aid ( no refills)  to help her sleep in the lab when she returns.CD

## 2017-04-23 NOTE — Procedures (Signed)
PATIENT'S NAME:  Kimberly Ray, Kimberly Ray DOB:      [birth year]      MR#:    [id number]     DATE OF RECORDING: 04/22/2017 REFERRING M.D.:  Unk Pinto, MD Study Performed:   Baseline Polysomnogram HISTORY:  OSA on CPAP, RLS, insomnia, EDS. Depression, GERD, Head trauma, Memory loss, Migraines,   The patient endorsed the Epworth Sleepiness Scale at 20/24 points.   The patient's weight 172 pounds with a height of 67 (inches), resulting in a BMI of 27.0 kg/m2. The patient's neck circumference measured 15 inches.  CURRENT MEDICATIONS: Wellbutrin, Relpax, Prozac, Flonase, Lamictal, Antivert, Rital-Metadate, Prilosec, Zofran, Pravachol, Carafate, Proventil, Zantac, DuoNeb.   PROCEDURE:  This is a multichannel digital polysomnogram utilizing the SomnoStar 11.2 system.  Electrodes and sensors were applied and monitored per AASM Specifications.   EEG, EOG, Chin and Limb EMG, were sampled at 200 Hz.  ECG, Snore and Nasal Pressure, Thermal Airflow, Respiratory Effort, CPAP Flow and Pressure, Oximetry was sampled at 50 Hz. Digital video and audio were recorded.      BASELINE STUDY Lights Out was at 22:27 and Lights On at 05:34.  Total recording time (TRT) was 427.5 minutes, with a total sleep time (TST) of 270.5 minutes.   The patient's sleep latency was 40 minutes.  REM latency was 350.5 minutes.  The sleep efficiency was 63.3 %.     SLEEP ARCHITECTURE: WASO (Wake after sleep onset) was 130 minutes.  There were 45 minutes in Stage N1, 139.5 minutes Stage N2, 44 minutes Stage N3 and 42 minutes in Stage REM.  The percentage of Stage N1 was 16.6%, Stage N2 was 51.6%, Stage N3 was 16.3% and Stage R (REM sleep) was 15.5%.   RESPIRATORY ANALYSIS:  There were a total of 61 respiratory events:  26 obstructive apneas, 2 central apneas and 0 mixed apneas with a total of 28 apneas and an apnea index (AI) of 6.2 /hour. There were 33 hypopneas with a hypopnea index of 7.3 /hour. The patient also had 0 respiratory event related  arousals (RERAs).  The total APNEA/HYPOPNEA INDEX (AHI) was 13.5/hour and the total RESPIRATORY DISTURBANCE INDEX was 13.5 /hour.  33 events occurred in REM sleep and 46 events in NREM. The REM AHI was 47.1 /hour, versus a non-REM AHI of 7.4. The patient spent 57.5 minutes of total sleep time in the supine position and 213 minutes in non-supine. The supine AHI was 16.7 versus a non-supine AHI of 12.6.  OXYGEN SATURATION & C02:  The Wake baseline 02 saturation was 97%, with the lowest being 84%. Time spent below 89% saturation equaled 6 minutes.  PERIODIC LIMB MOVEMENTS:  The patient had a total of 511 Periodic Limb Movements.  The Periodic Limb Movement (PLM) index was 113.3 and the PLM Arousal index was 22.4/hour.  EKG sinus rhythm with PVCs. Nocturia times 3, patient reported discomfort and being more restless than usual.   IMPRESSION:   1. Severe Periodic Limb Movement Disorder (PLMD) 2. Mild Obstructive Sleep Apnea(OSA) with general AHI of 13.5 /hr. but  with REM accentuation ( REM AHI was 47/hr. ). No clinically significant hypoxemia.  3. Insomnia, sleep fragmentation.  RECOMMENDATIONS:  1. Recommend CPAP titration under use of a sleep aid  to identify an optimal treatment pressure-  2. There were severe periodic limb movements of sleep (PLMS) with associated sleep disruption.  I will consider treating the PLMS primarily with medication.   3. Avoid sedative-hypnotics which may worsen sleep apnea, alcohol and tobacco (  as applicable). 4. Further information regarding OSA may be obtained from [employer] (www.sleepfoundation.org) or American Sleep Apnea Association (www.sleepapnea.org). Correlate clinically for a history consistent with regarding restless legs syndrome (RLS).    Consider secondary restless legs syndrome. Obtain a serum ferritin level if the clinical history is consistent with RLS.  Consider iron therapy and evaluation for iron deficiency anemia if the serum  ferritin level < 50 ng/ml.  Certain medications or substances may aggravate RLS and common offenders may include the following:  nicotine, caffeine, SSRIs, TCAs, phenothiazine, dopamine antagonists, diphenhydramine, and alcohol.   5. Consider dedicated sleep psychology referral if insomnia is of clinical concern.   6. A follow up appointment will be scheduled in the Sleep Clinic at [facility]. The referring provider will be notified of the results.      I certify that I have reviewed the entire raw data recording prior to the issuance of this report in accordance with the Standards of Accreditation of the American Academy of Sleep Medicine (AASM)      Larey Seat, MD   04-23-2017 Diplomat, American Board of Psychiatry and Neurology  Diplomat, American Board of Sleep Medicine Medical Director of [facility] [employer]

## 2017-04-23 NOTE — Telephone Encounter (Signed)
Called to discuss the sleep study results with pt. She was unable to come to phone. The husband stated he would make her aware of our call and tell the pt to call back.

## 2017-04-23 NOTE — Addendum Note (Signed)
Addended by: Larey Seat on: 04/23/2017 08:38 AM   Modules accepted: Orders

## 2017-05-12 ENCOUNTER — Ambulatory Visit (INDEPENDENT_AMBULATORY_CARE_PROVIDER_SITE_OTHER): Payer: Managed Care, Other (non HMO) | Admitting: Neurology

## 2017-05-12 DIAGNOSIS — G4733 Obstructive sleep apnea (adult) (pediatric): Secondary | ICD-10-CM | POA: Diagnosis not present

## 2017-05-12 DIAGNOSIS — G4761 Periodic limb movement disorder: Secondary | ICD-10-CM

## 2017-05-12 DIAGNOSIS — G43011 Migraine without aura, intractable, with status migrainosus: Secondary | ICD-10-CM

## 2017-05-12 DIAGNOSIS — G4720 Circadian rhythm sleep disorder, unspecified type: Secondary | ICD-10-CM

## 2017-05-12 DIAGNOSIS — R0683 Snoring: Secondary | ICD-10-CM

## 2017-05-12 DIAGNOSIS — G4719 Other hypersomnia: Secondary | ICD-10-CM

## 2017-05-12 NOTE — Progress Notes (Signed)
Assessment and Plan:    Mixed hyperlipidemia -continue medications, check lipids, decrease fatty foods, increase activity.  -     CBC with Differential/Platelet -     BASIC METABOLIC PANEL WITH GFR -     Hepatic function panel -     Lipid panel  Bipolar depression (HCC) Continue medications  Elevated BP without diagnosis of hypertension - continue medications, DASH diet, exercise and monitor at home. Call if greater than 130/80.  -     TSH  Needs flu shot -     Flu vaccine HIGH DOSE PF  Female cystocele -     Ambulatory referral to Gynecology  Vitamin D deficiency -     VITAMIN D 25 Hydroxy (Vit-D Deficiency, Fractures)  Anemia, unspecified type -     Iron,Total/Total Iron Binding Cap    Continue diet and meds as discussed. Further disposition pending results of labs. Discussed med's effects and SE's.   Future Appointments Date Time Provider [place]  01/21/2018 9:00 AM Vicie Mutters, PA-C [facility] None    HPI 66 y.o. female  presents for 3 month follow up with hypertension, hyperlipidemia, diabetes and vitamin D deficiency.   Her blood pressure has been controlled at home, today their BP is BP: 118/66.She does not workout. She denies chest pain, shortness of breath, dizziness.   She is on cholesterol medication and denies myalgias. Her cholesterol is not at goal. The cholesterol was:  01/20/2017: Cholesterol 182; HDL 47; LDL Cholesterol 74; Triglycerides 303   She has been working on diet and exercise for prediabetes without complications, she is on bASA, she is on ACE/ARB, and denies  foot ulcerations, hyperglycemia, hypoglycemia , increased appetite, nausea, paresthesia of the feet, polydipsia, polyuria, visual disturbances, vomiting and weight loss. Last A1C was: 06/12/2016: Hgb A1c MFr Bld 5.3   Patient is on Vitamin D supplement. 12/09/2016: Vit D, 25-Hydroxy 30  BMI is Body mass index is 26.81 kg/m., she is working on diet and exercise. She had sleep  study and is going to get on CPAP, AHI 13.  Wt Readings from Last 3 Encounters:  05/13/17 171 lb 3.2 oz (77.7 kg)  03/25/17 172 lb (78 kg)  01/20/17 169 lb 9.6 oz (76.9 kg)     Current Medications:  Current Outpatient Prescriptions on File Prior to Visit  Medication Sig Dispense Refill  . ALPRAZolam (XANAX) 0.5 MG tablet Take 1 tablet (0.5 mg total) by mouth at bedtime as needed for anxiety. 15 tablet 0  . buPROPion (WELLBUTRIN XL) 300 MG 24 hr tablet TAKE 1 TABLET DAILY FOR    DEPRESSION 90 tablet 3  . eletriptan (RELPAX) 20 MG tablet Take 1 tablet (20 mg total) by mouth as needed for migraine or headache. 1 po at onset of headache. May repeat in 2 hours if headache persists or recurs. 24 tablet 3  . FLUoxetine (PROZAC) 20 MG tablet TAKE 1 TABLET TWICE A DAY 180 tablet 1  . fluticasone (FLONASE) 50 MCG/ACT nasal spray PLACE 2 SPRAYS INTO BOTH NOSTRILS AT BEDTIME. 16 g 1  . lamoTRIgine (LAMICTAL) 100 MG tablet Take 1 tablet (100 mg total) by mouth 2 (two) times daily. 180 tablet 1  . meclizine (ANTIVERT) 25 MG tablet 1/2-1 pill up to 3 times daily for motion sickness/dizziness 30 tablet 0  . methylphenidate (METADATE CD) 10 MG CR capsule Take 1 capsule (10 mg total) by mouth 3 (three) times daily as needed. 90 capsule 0  . neomycin-polymyxin b-dexamethasone (MAXITROL) 3.5-10000-0.1 SUSP  1 to 1 drops to the affected eye every 1 to 2 hours today, then 4 x /day 5 mL 1  . omeprazole (PRILOSEC) 40 MG capsule     . ondansetron (ZOFRAN-ODT) 8 MG disintegrating tablet PLACE 1 TABLET ON THE      TONGUE AND ALLOW TO        DISSOLVE EVERY 4 HOURS AS  NEEDED FOR NAUSEA 90 tablet 0  . pravastatin (PRAVACHOL) 40 MG tablet TAKE ONE TABLET BY MOUTH EVERY DAY IN THE EVENING 90 tablet 1  . rOPINIRole (REQUIP) 0.25 MG tablet Take 1 tablet (0.25 mg total) by mouth at bedtime. 30 tablet 3  . sucralfate (CARAFATE) 1 g tablet Take 1 tablet (1 g total) by mouth 4 (four) times daily. 120 tablet 1  . albuterol  (PROVENTIL HFA;VENTOLIN HFA) 108 (90 Base) MCG/ACT inhaler Use 1 to 2 inhalations 5 to 10 minutes apart every 4 hours if need too rescue Asthma 18 g 3  . ranitidine (ZANTAC) 300 MG tablet Take 1 tablet 2 x daily for acid indigestion & reflux 180 tablet 3   Current Facility-Administered Medications on File Prior to Visit  Medication Dose Route Frequency Provider Last Rate Last Dose  . ipratropium-albuterol (DUONEB) 0.5-2.5 (3) MG/3ML nebulizer solution 3 mL  3 mL Nebulization Once Starlyn Skeans, PA-C       Medical History:  Past Medical History:  Diagnosis Date  . Anemia   . Depression   . GERD (gastroesophageal reflux disease)   . Hay fever   . Head trauma   . IBS (irritable bowel syndrome)   . Memory loss   . Migraines   . OSA on CPAP   . Reflux   . RLS (restless legs syndrome)   . Sinus congestion    Allergies:  Allergies  Allergen Reactions  . Erythromycin Other (See Comments)    TBS  . Ivp Dye [Iodinated Diagnostic Agents] Other (See Comments)    Hot flashes, itching scalp  . Penicillins Hives  . Clarithromycin Other (See Comments)    Itching ankles  . Brintellix [Vortioxetine]     Itching  . Macrodantin [Nitrofurantoin]      Review of Systems:  Review of Systems  Constitutional: Negative for chills, fever and malaise/fatigue.  HENT: Negative for congestion, ear pain and sore throat.   Eyes: Negative.   Respiratory: Negative for cough, shortness of breath and wheezing.   Cardiovascular: Negative for chest pain, palpitations and leg swelling.  Gastrointestinal: Negative for abdominal pain, blood in stool, constipation, diarrhea, heartburn and melena.  Genitourinary: Negative.   Skin: Negative.   Neurological: Negative for dizziness, sensory change, loss of consciousness and headaches.  Psychiatric/Behavioral: Negative for depression. The patient is not nervous/anxious and does not have insomnia.     Family history- Review and unchanged  Social history-  Review and unchanged  Physical Exam: BP 118/66   Pulse 74   Temp 97.7 F (36.5 C)   Resp 16   Ht 5\' 7"  (1.702 m)   Wt 171 lb 3.2 oz (77.7 kg)   SpO2 97%   BMI 26.81 kg/m  Wt Readings from Last 3 Encounters:  05/13/17 171 lb 3.2 oz (77.7 kg)  03/25/17 172 lb (78 kg)  01/20/17 169 lb 9.6 oz (76.9 kg)   General Appearance: Well nourished well developed, non-toxic appearing, in no apparent distress. Eyes: PERRLA, EOMs, conjunctiva no swelling or erythema ENT/Mouth: Ear canals clear with no erythema, swelling, or discharge.  TMs normal bilaterally, oropharynx  clear, moist, with no exudate.   Neck: Supple, thyroid normal, no JVD, no cervical adenopathy.  Respiratory: Respiratory effort normal, breath sounds clear A&P, no wheeze, rhonchi or rales noted.  No retractions, no accessory muscle usage Cardio: RRR with no MRGs. No noted edema.  Abdomen: Soft, + BS.  Non tender, no guarding, rebound, hernias, masses. Musculoskeletal: Full ROM, 5/5 strength, Normal gait Skin: Warm, dry without rashes, lesions, ecchymosis.  Neuro: Awake and oriented X 3, Cranial nerves intact. No cerebellar symptoms.  Psych: normal affect, Insight and Judgment appropriate.    Vicie Mutters, PA-C 2:53 PM [facility]

## 2017-05-13 ENCOUNTER — Encounter: Payer: Self-pay | Admitting: Physician Assistant

## 2017-05-13 ENCOUNTER — Ambulatory Visit: Payer: Managed Care, Other (non HMO) | Admitting: Physician Assistant

## 2017-05-13 VITALS — BP 118/66 | HR 74 | Temp 97.7°F | Resp 16 | Ht 67.0 in | Wt 171.2 lb

## 2017-05-13 DIAGNOSIS — D649 Anemia, unspecified: Secondary | ICD-10-CM

## 2017-05-13 DIAGNOSIS — Z23 Encounter for immunization: Secondary | ICD-10-CM

## 2017-05-13 DIAGNOSIS — R7309 Other abnormal glucose: Secondary | ICD-10-CM

## 2017-05-13 DIAGNOSIS — N8110 Cystocele, unspecified: Secondary | ICD-10-CM

## 2017-05-13 DIAGNOSIS — Z79899 Other long term (current) drug therapy: Secondary | ICD-10-CM

## 2017-05-13 DIAGNOSIS — E559 Vitamin D deficiency, unspecified: Secondary | ICD-10-CM

## 2017-05-13 DIAGNOSIS — F319 Bipolar disorder, unspecified: Secondary | ICD-10-CM

## 2017-05-13 DIAGNOSIS — R030 Elevated blood-pressure reading, without diagnosis of hypertension: Secondary | ICD-10-CM

## 2017-05-13 DIAGNOSIS — E782 Mixed hyperlipidemia: Secondary | ICD-10-CM

## 2017-05-13 NOTE — Patient Instructions (Addendum)
You can try some of the at home treatments for Restless legs and we will check some labs on you looking for deficiencies that could contribute to it however we will have you start on :   Requip 0.25mg - start this dose about 1-2 hours BEFORE restless symptoms start.  The dose may be increased by 0.25 mg every two to three days until relief is obtained. Most patients require at least 2 mg, and doses up to 4 mg may be needed. Common adverse side effects: usually mild, transient, and limited to nausea, lightheadedness, and fatigue; these usually resolve within 10 to 14 days. Less frequent side effects include nasal stuffiness, constipation, and leg edema; these are reversible if the medication is stopped.   Restless Legs Syndrome Restless legs syndrome is a movement disorder. It may also be called a sensorimotor disorder.  CAUSES  No one knows what specifically causes restless legs syndrome, but it tends to run in families. It is also more common in people with low iron, in pregnancy, in people who need dialysis, and those with nerve damage (neuropathy).Some medications may make restless legs syndrome worse.Those medications include drugs to treat high blood pressure, some heart conditions, nausea, colds, allergies, and depression. SYMPTOMS Symptoms include uncomfortable sensations in the legs. These leg sensations are worse during periods of inactivity or rest. They are also worse while sitting or lying down. Individuals that have the disorder describe sensations in the legs that feel like:  Pulling.  Drawing.  Crawling.  Worming.  Boring.  Tingling.  Pins and needles.  Prickling.  Pain. The sensations are usually accompanied by an overwhelming urge to move the legs. Sudden muscle jerks may also occur. Movement provides temporary relief from the discomfort. In rare cases, the arms may also be affected. Symptoms may interfere with going to sleep (sleep onset insomnia). Restless legs  syndrome may also be related to periodic limb movement disorder (PLMD). PLMD is another more common motor disorder. It also causes interrupted sleep. The symptoms from PLMD usually occur most often when you are awake. TREATMENT  Treatment for restless legs syndrome is symptomatic. This means that the symptoms are treated.   Massage and cold compresses may provide temporary relief.  Walk, stretch, or take a cold or hot bath.  Get regular exercise and a good night's sleep.  Avoid caffeine, alcohol, nicotine, and medications that can make it worse.  Do activities that provide mental stimulation like discussions, needlework, and video games. These may be helpful if you are not able to walk or stretch. Some medications are effective in relieving the symptoms. However, many of these medications have side effects. Ask your caregiver about medications that may help your symptoms. Correcting iron deficiency may improve symptoms for some patients. Document Released: 07/25/2002 Document Revised: 12/19/2013 Document Reviewed: 10/31/2010 [facility] Patient Information 2015 [place], [place]. This information is not intended to replace advice given to you by your health care provider. Make sure you discuss any questions you have with your health care provider.   About Cystocele  Overview  The pelvic organs, including the bladder, are normally supported by pelvic floor muscles and ligaments.  When these muscles and ligaments are stretched, weakened or torn, the wall between the bladder and the vagina sags or herniates causing a prolapse, sometimes called a cystocele.  This condition may cause discomfort and problems with emptying the bladder.  It can be present in various stages.  Some people are not aware of the changes.  Others may notice  changes at the vaginal opening or a feeling of the bladder dropping outside the body.  Causes of a Cystocele  A cystocele is usually caused by muscle straining or  stretching during childbirth.  In addition, cystocele is more common after menopause, because the hormone estrogen helps keep the elastic tissues around the pelvic organs strong.  A cystocele is more likely to occur when levels of estrogen decrease.  Other causes include: heavy lifting, chronic coughing, previous pelvic surgery and obesity.  Symptoms  A bladder that has dropped from its normal position may cause: unwanted urine leakage (stress incontinence), frequent urination or urge to urinate, incomplete emptying of the bladder (not feeling bladder relief after emptying), pain or discomfort in the vagina, pelvis, groin, lower back or lower abdomen and frequent urinary tract infections.  Mild cases may not cause any symptoms.  Treatment Options  Pelvic floor (Kegel) exercises:  Strength training the muscles in your genital area  Behavioral changes: Treating and preventing constipation, taking time to empty your bladder properly, learning to lift properly and/or avoid heavy lifting when possible, stopping smoking, avoiding weight gain and treating a chronic cough or bronchitis.  A pessary: A vaginal support device is sometimes used to help pelvic support caused by muscle and ligament changes.  Surgery: Surgical repair may be necessary if symptoms cannot be managed with exercise, behavioral changes and a pessary.  Surgery is usually considered for severe cases.   2007, [employer]

## 2017-05-14 ENCOUNTER — Telehealth: Payer: Self-pay | Admitting: Neurology

## 2017-05-14 LAB — CBC WITH DIFFERENTIAL/PLATELET
BASOS ABS: 43 {cells}/uL (ref 0–200)
BASOS PCT: 0.7 %
EOS ABS: 122 {cells}/uL (ref 15–500)
Eosinophils Relative: 2 %
HCT: 38.1 % (ref 35.0–45.0)
Hemoglobin: 12.7 g/dL (ref 11.7–15.5)
Lymphs Abs: 2141 cells/uL (ref 850–3900)
MCH: 27.7 pg (ref 27.0–33.0)
MCHC: 33.3 g/dL (ref 32.0–36.0)
MCV: 83.2 fL (ref 80.0–100.0)
MPV: 10.3 fL (ref 7.5–12.5)
Monocytes Relative: 10.1 %
NEUTROS PCT: 52.1 %
Neutro Abs: 3178 cells/uL (ref 1500–7800)
PLATELETS: 353 10*3/uL (ref 140–400)
RBC: 4.58 10*6/uL (ref 3.80–5.10)
RDW: 13.2 % (ref 11.0–15.0)
TOTAL LYMPHOCYTE: 35.1 %
WBC: 6.1 10*3/uL (ref 3.8–10.8)
WBCMIX: 616 {cells}/uL (ref 200–950)

## 2017-05-14 LAB — TSH: TSH: 2.02 mIU/L (ref 0.40–4.50)

## 2017-05-14 LAB — BASIC METABOLIC PANEL WITH GFR
BUN: 20 mg/dL (ref 7–25)
CO2: 23 mmol/L (ref 20–32)
Calcium: 9.8 mg/dL (ref 8.6–10.4)
Chloride: 104 mmol/L (ref 98–110)
Creat: 0.98 mg/dL (ref 0.50–0.99)
GFR, EST AFRICAN AMERICAN: 69 mL/min/{1.73_m2} (ref >=60)
GFR, EST NON AFRICAN AMERICAN: 60 mL/min/{1.73_m2} (ref >=60)
Glucose, Bld: 95 mg/dL (ref 65–99)
POTASSIUM: 4.3 mmol/L (ref 3.5–5.3)
SODIUM: 140 mmol/L (ref 135–146)

## 2017-05-14 LAB — LIPID PANEL
Cholesterol: 236 mg/dL — ABNORMAL HIGH (ref <200)
HDL: 43 mg/dL — ABNORMAL LOW (ref >50)
Non-HDL Cholesterol (Calc): 193 mg/dL (calc) — ABNORMAL HIGH (ref <130)
Total CHOL/HDL Ratio: 5.5 (calc) — ABNORMAL HIGH (ref <5.0)
Triglycerides: 598 mg/dL — ABNORMAL HIGH (ref <150)

## 2017-05-14 LAB — HEPATIC FUNCTION PANEL
AG Ratio: 2 (calc) (ref 1.0–2.5)
ALKALINE PHOSPHATASE (APISO): 121 U/L (ref 33–130)
ALT: 17 U/L (ref 6–29)
AST: 25 U/L (ref 10–35)
Albumin: 4.4 g/dL (ref 3.6–5.1)
BILIRUBIN DIRECT: 0.1 mg/dL (ref 0.0–0.2)
BILIRUBIN INDIRECT: 0.2 mg/dL (ref 0.2–1.2)
BILIRUBIN TOTAL: 0.3 mg/dL (ref 0.2–1.2)
Globulin: 2.2 g/dL (calc) (ref 1.9–3.7)
Total Protein: 6.6 g/dL (ref 6.1–8.1)

## 2017-05-14 LAB — IRON, TOTAL/TOTAL IRON BINDING CAP
%SAT: 10 % (calc) — ABNORMAL LOW (ref 11–50)
IRON: 44 ug/dL — AB (ref 45–160)
TIBC: 432 ug/dL (ref 250–450)

## 2017-05-14 LAB — VITAMIN D 25 HYDROXY (VIT D DEFICIENCY, FRACTURES): VIT D 25 HYDROXY: 35 ng/mL (ref 30–100)

## 2017-05-14 NOTE — Telephone Encounter (Signed)
-----   Message from Larey Seat, MD sent at 05/14/2017  1:10 PM EDT ----- CPAP resolved apnea and helped with uninterrupted sleep, but severe PLMs were still seen. See attached hypnogram. Order for CPAP 7 cm with nasal pillow was issued.

## 2017-05-14 NOTE — Telephone Encounter (Signed)
Called patient to discuss sleep study results. No answer. LVM for pt to return call.

## 2017-05-14 NOTE — Addendum Note (Signed)
Addended by: Larey Seat on: 05/14/2017 01:10 PM   Modules accepted: Orders

## 2017-05-14 NOTE — Procedures (Signed)
PATIENT'S NAME:  Kimberly Ray, Effertz DOB:      July 19, 1951      MR#:    [id number]     DATE OF RECORDING: 05/12/2017 REFERRING M.D.:  Unk Pinto, MD Study Performed:   Titration to CPAP HISTORY:  Kimberly Ray underwent a baseline sleep study with [name] on 04/22/17, resulting in total APNEA/HYPOPNEA INDEX (AHI) of 13.5/hour and REM AHI of 47.1 /hour, versus a non-REM AHI of 7.4. The patient spent 57.5 minutes of total sleep time in the supine position and 213 minutes in non-supine. The supine AHI was 16.7 versus a non-supine AHI of 12.6. OXYGEN SATURATION Nadir being 84%, no significant hypoxemia duration.   The patient had 511 Periodic Limb Movements.  The PLM index was 113.3/hr. and the PLM Arousal index was 22.4/hour.  OSA on CPAP, RLS, Insomnia, EDS. Depression, GERD, Head trauma, Memory loss, Migraines,    The patient endorsed the Epworth Sleepiness Scale at 20/24 points.   The patient's weight 172 pounds with a height of 67 (inches), resulting in a BMI of 27.0 kg/m2. The patient's neck circumference measured 15 inches.  CURRENT MEDICATIONS: Wellbutrin, Relpax, Prozac, Flonase, Lamictal, Antivert, Metadate, Prilosec, Zofran, Pravachol, Carafate, Proventil, Zantac, Duoneb.   PROCEDURE:  This is a multichannel digital polysomnogram utilizing the SomnoStar 11.2 system.  Electrodes and sensors were applied and monitored per AASM Specifications.   EEG, EOG, Chin and Limb EMG, were sampled at 200 Hz.  ECG, Snore and Nasal Pressure, Thermal Airflow, Respiratory Effort, CPAP Flow and Pressure, Oximetry was sampled at 50 Hz. Digital video and audio were recorded.      CPAP was initiated at 5 cmH20 with heated humidity per AASM split night standards and pressure was advanced to 7/7cmH20 because of hypopneas, apneas and desaturations.  At a PAP pressure of 7 cmH20, there was a reduction of the AHI to 0.0 with improvement of nadir to 88% and 265 minutes of sleep, 100% efficiency.  Lights Out was at 21:25 and  Lights On at 05:19. Total recording time (TRT) was 417 minutes, with a total sleep time (TST) of 417 minutes. The patient's sleep latency was 0.0 minutes with 0 minutes of wake time after sleep onset. REM latency was 147 minutes.  The sleep efficiency was 100 %.    SLEEP ARCHITECTURE: WASO (Wake after sleep onset)   was 0 minutes.  There were 2 minutes in Stage N1, 277 minutes Stage N2, 66 minutes Stage N3 and 121.5 minutes in Stage REM.  The percentage of Stage N1 was .4%, Stage N2 was 59.4%, Stage N3 was 14.1% and Stage R (REM sleep) was 26.%.   RESPIRATORY ANALYSIS:  There was a total of 0 respiratory events and  0 respiratory event related arousals (RERAs).     OXYGEN SATURATION & C02:  The baseline 02 saturation was 0%, with the lowest being 88%. Time spent below 89% saturation equaled 1 minute.  PERIODIC LIMB MOVEMENTS:    The patient had a total of 165 Periodic Limb Movements. The Periodic Limb Movement (PLM) index was 23.7 and the PLM Arousal index was 1.6 /hour. The arousals were noted as: 28 were spontaneous, 11 were associated with PLMs, and none (0) were associated with respiratory events. Audio and video analysis did not show any abnormal or unusual movements, behaviors, phonations or vocalizations.   No nocturia, mild snoring. EKG was in keeping with normal sinus rhythm (NSR). Post-study, the patient indicated that sleep was better than usual.    DIAGNOSIS Obstructive Sleep  Apnea, responding well to CPAP at only 7 cm water pressure. Nasal pillow- The patient was fitted with a ResMed AirFit P10 with small pillows.  1. PLMs persisted under CPAP pressure, the patient has reported a history of RLS. Will evaluate further for medication effect, causality of RLS/ PLMs.  2. High sleep efficiency, which contradicts the Insomnia report- CPAP related improvement?    PLANS/RECOMMENDATIONS: A follow up appointment will be scheduled in the Sleep Clinic at [facility].   Please  call [phone] with any questions.     I certify that I have reviewed the entire raw data recording prior to the issuance of this report in accordance with the Standards of Accreditation of the American Academy of Sleep Medicine (AASM)    Larey Seat, M.D.   05-14-2017  Diplomat, American Board of Psychiatry and Neurology  Diplomat, [place] [employer] Medical Director, [place] [facility]

## 2017-05-19 ENCOUNTER — Encounter: Payer: Self-pay | Admitting: Physician Assistant

## 2017-05-19 ENCOUNTER — Other Ambulatory Visit: Payer: Self-pay | Admitting: Physician Assistant

## 2017-05-19 MED ORDER — FENOFIBRATE 145 MG PO TABS: 145.0000 mg | ORAL_TABLET | Freq: Every day | ORAL | 3 refills | Status: DC

## 2017-05-20 ENCOUNTER — Ambulatory Visit (INDEPENDENT_AMBULATORY_CARE_PROVIDER_SITE_OTHER): Payer: Managed Care, Other (non HMO) | Admitting: Adult Health

## 2017-05-20 ENCOUNTER — Encounter: Payer: Self-pay | Admitting: Adult Health

## 2017-05-20 VITALS — BP 132/80 | HR 91 | Temp 97.0°F | Resp 16 | Ht 67.0 in | Wt 174.6 lb

## 2017-05-20 DIAGNOSIS — R05 Cough: Secondary | ICD-10-CM

## 2017-05-20 DIAGNOSIS — R059 Cough, unspecified: Secondary | ICD-10-CM

## 2017-05-20 DIAGNOSIS — J40 Bronchitis, not specified as acute or chronic: Secondary | ICD-10-CM

## 2017-05-20 DIAGNOSIS — E785 Hyperlipidemia, unspecified: Secondary | ICD-10-CM

## 2017-05-20 MED ORDER — AZITHROMYCIN 250 MG PO TABS: ORAL_TABLET | ORAL | 0 refills | Status: AC

## 2017-05-20 MED ORDER — PRAVASTATIN SODIUM 40 MG PO TABS: 40.0000 mg | ORAL_TABLET | Freq: Every evening | ORAL | 1 refills | Status: DC

## 2017-05-20 MED ORDER — PREDNISONE 20 MG PO TABS: ORAL_TABLET | ORAL | 0 refills | Status: DC

## 2017-05-20 MED ORDER — BENZONATATE 100 MG PO CAPS: 200.0000 mg | ORAL_CAPSULE | Freq: Three times a day (TID) | ORAL | 0 refills | Status: DC | PRN

## 2017-05-20 NOTE — Patient Instructions (Signed)

## 2017-05-20 NOTE — Progress Notes (Signed)
Assessment and Plan:  Briann was seen today for acute visit, cough and headache.  Diagnoses and all orders for this visit:  Bronchitis -     benzonatate (TESSALON PERLES) 100 MG capsule; Take 2 capsules (200 mg total) by mouth 3 (three) times daily as needed for cough (Max: 600mg  per day). -     predniSONE (DELTASONE) 20 MG tablet; 2 tablets daily for 3 days, 1 tablet daily for 4 days. -     azithromycin (ZITHROMAX Z-PAK) 250 MG tablet; Take 2 tablets PO on day 1, then take 1 tablet PO every day for 4 days for pneumonia. (fill only if needed).   Hyperlipidemia, unspecified hyperlipidemia type -     pravastatin (PRAVACHOL) 40 MG tablet; Take 1 tablet (40 mg total) by mouth every evening.  Cough -     benzonatate (TESSALON PERLES) 100 MG capsule; Take 2 capsules (200 mg total) by mouth 3 (three) times daily as needed for cough (Max: 600mg  per day).  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 Refilled pravastatin per patient request today; discussed risks associated with taking fenofibrate with pravastatin, and that she should discontinue this immediately and contact the office should she experience muscle aches. Information for caring for a cold/URI provided. ER precautions given.   Further disposition pending results of labs. Discussed med's effects and SE's.   Over 30 minutes of exam, counseling, chart review, and critical decision making was performed.   Future Appointments Date Time Provider [place]  07/31/2017 11:00 AM Dennie Bible, NP [facility] None  08/26/2017 8:45 AM Vicie Mutters, PA-C [facility] None  01/21/2018 9:00 AM Vicie Mutters, PA-C [facility] None     ------------------------------------------------------------------------------------------------------------------   HPI BP 132/80   Pulse 91   Temp (!) 97 F (36.1 C)   Resp 16   Ht 5\' 7"  (1.702 m)   Wt 174 lb 9.6 oz (79.2 kg)   SpO2 97%   BMI 27.35 kg/m   66 y.o.female presents for productive cough that began yesterday suddenly, accompanied by congestion, HA, body aches, chills and ear fullness. She endorses some chest pressure with deep inspiration, but denies CP, SOB, wheezing, blurry vision, N/V/D, stiff neck/neck pain. She denies sinus pressure, sore throat, rashes, known sick contacts.  She reports she has frequent URIs, but that this particular episode is much more sudden and severe in its onset than typical for her.   She reports she has taken seudofed, dextromethorphan +guaifenesin, tylenol x 2, last night then went back to bed, but woke up continuing to feel bad this AM.  She reports she has tolerated azithromycin in the past.   Past Medical History:  Diagnosis Date  . Anemia   . Depression   . GERD (gastroesophageal reflux disease)   . Hay fever   . Head trauma   . IBS (irritable bowel syndrome)   . Memory loss   . Migraines   . OSA on CPAP   . Reflux   . RLS (restless legs syndrome)   . Sinus congestion      Allergies  Allergen Reactions  . Erythromycin Other (See Comments)    TBS  . Ivp Dye [Iodinated Diagnostic Agents] Other (See Comments)    Hot flashes, itching scalp  . Penicillins Hives  .  Clarithromycin Other (See Comments)    Itching ankles  . Brintellix [Vortioxetine]     Itching  . Macrodantin [Nitrofurantoin]     Current Outpatient Prescriptions on File Prior to Visit  Medication Sig  . ALPRAZolam (XANAX) 0.5 MG tablet Take 1 tablet (0.5 mg total) by mouth at bedtime as needed for anxiety.  Marland Kitchen buPROPion (WELLBUTRIN XL) 300 MG 24 hr tablet TAKE 1 TABLET DAILY FOR    DEPRESSION  . eletriptan (RELPAX) 20 MG tablet Take 1 tablet (20 mg  total) by mouth as needed for migraine or headache. 1 po at onset of headache. May repeat in 2 hours if headache persists or recurs.  . fenofibrate (TRICOR) 145 MG tablet Take 1 tablet (145 mg total) by mouth daily.  Marland Kitchen FLUoxetine (PROZAC) 20 MG tablet TAKE 1 TABLET TWICE A DAY  . fluticasone (FLONASE) 50 MCG/ACT nasal spray PLACE 2 SPRAYS INTO BOTH NOSTRILS AT BEDTIME.  Marland Kitchen lamoTRIgine (LAMICTAL) 100 MG tablet Take 1 tablet (100 mg total) by mouth 2 (two) times daily.  . meclizine (ANTIVERT) 25 MG tablet 1/2-1 pill up to 3 times daily for motion sickness/dizziness  . methylphenidate (METADATE CD) 10 MG CR capsule Take 1 capsule (10 mg total) by mouth 3 (three) times daily as needed.  . neomycin-polymyxin b-dexamethasone (MAXITROL) 3.5-10000-0.1 SUSP 1 to 1 drops to the affected eye every 1 to 2 hours today, then 4 x /day  . omeprazole (PRILOSEC) 40 MG capsule   . ondansetron (ZOFRAN-ODT) 8 MG disintegrating tablet PLACE 1 TABLET ON THE      TONGUE AND ALLOW TO        DISSOLVE EVERY 4 HOURS AS  NEEDED FOR NAUSEA  . pravastatin (PRAVACHOL) 40 MG tablet TAKE ONE TABLET BY MOUTH EVERY DAY IN THE EVENING  . rOPINIRole (REQUIP) 0.25 MG tablet Take 1 tablet (0.25 mg total) by mouth at bedtime.  . sucralfate (CARAFATE) 1 g tablet Take 1 tablet (1 g total) by mouth 4 (four) times daily.  Marland Kitchen albuterol (PROVENTIL HFA;VENTOLIN HFA) 108 (90 Base) MCG/ACT inhaler Use 1 to 2 inhalations 5 to 10 minutes apart every 4 hours if need too rescue Asthma  . ranitidine (ZANTAC) 300 MG tablet Take 1 tablet 2 x daily for acid indigestion & reflux   Current Facility-Administered Medications on File Prior to Visit  Medication  . ipratropium-albuterol (DUONEB) 0.5-2.5 (3) MG/3ML nebulizer solution 3 mL    ROS: all negative except above.   Physical Exam:  BP 132/80   Pulse 91   Temp (!) 97 F (36.1 C)   Resp 16   Ht 5\' 7"  (1.702 m)   Wt 174 lb 9.6 oz (79.2 kg)   SpO2 97%   BMI 27.35 kg/m   General Appearance:  Well nourished, appears unwell but in no acute distress. Eyes: PERRLA, conjunctiva no swelling or erythema Sinuses: No Frontal/maxillary tenderness ENT/Mouth: Ext aud canals clear, TMs without erythema, bulging, effusion present to right ear. Posterior pharynx mildly injected, no notable swelling.  Tonsils not present. Hearing normal.  Neck: Supple, without lymphadenopathy.   Respiratory: Respiratory effort normal, BS equal bilaterally with scattered rales in RLL, coarse bronchial sound;  wheezing or stridor.  Cardio: RRR with no MRGs. Brisk peripheral pulses without edema.  Abdomen: Soft, + BS.  Non tender, no guarding, rebound, hernias, masses.  Skin: Warm, dry without rashes, lesions, ecchymosis.  Neuro: Cranial nerves intact. Normal muscle tone, no cerebellar symptoms. Sensation intact.  Psych: Awake and oriented X 3, normal  affect, Insight and Judgment appropriate.     Izora Ribas, NP 2:51 PM [facility]

## 2017-06-17 ENCOUNTER — Other Ambulatory Visit: Payer: Self-pay

## 2017-06-17 MED ORDER — FLUTICASONE PROPIONATE 50 MCG/ACT NA SUSP: 2.0000 | Freq: Every day | NASAL | 1 refills | Status: DC

## 2017-07-28 ENCOUNTER — Other Ambulatory Visit: Payer: Self-pay | Admitting: Internal Medicine

## 2017-07-28 ENCOUNTER — Encounter: Payer: Self-pay | Admitting: Internal Medicine

## 2017-07-28 ENCOUNTER — Ambulatory Visit: Payer: Managed Care, Other (non HMO) | Admitting: Internal Medicine

## 2017-07-28 VITALS — BP 122/74 | HR 86 | Temp 97.3°F | Resp 16 | Ht 67.0 in | Wt 174.4 lb

## 2017-07-28 DIAGNOSIS — K219 Gastro-esophageal reflux disease without esophagitis: Secondary | ICD-10-CM

## 2017-07-28 DIAGNOSIS — D508 Other iron deficiency anemias: Secondary | ICD-10-CM | POA: Diagnosis not present

## 2017-07-28 DIAGNOSIS — Z79899 Other long term (current) drug therapy: Secondary | ICD-10-CM | POA: Diagnosis not present

## 2017-07-28 DIAGNOSIS — R1032 Left lower quadrant pain: Secondary | ICD-10-CM

## 2017-07-28 DIAGNOSIS — R112 Nausea with vomiting, unspecified: Secondary | ICD-10-CM

## 2017-07-28 DIAGNOSIS — K5792 Diverticulitis of intestine, part unspecified, without perforation or abscess without bleeding: Secondary | ICD-10-CM

## 2017-07-28 MED ORDER — RANITIDINE HCL 300 MG PO TABS: ORAL_TABLET | ORAL | 1 refills | Status: DC

## 2017-07-28 MED ORDER — ALBUTEROL SULFATE HFA 108 (90 BASE) MCG/ACT IN AERS: INHALATION_SPRAY | RESPIRATORY_TRACT | 3 refills | Status: DC

## 2017-07-28 MED ORDER — ONDANSETRON HCL 4 MG PO TABS: ORAL_TABLET | ORAL | 1 refills | Status: DC

## 2017-07-28 MED ORDER — METRONIDAZOLE 500 MG PO TABS: ORAL_TABLET | ORAL | 0 refills | Status: DC

## 2017-07-28 MED ORDER — ESOMEPRAZOLE MAGNESIUM 40 MG PO CPDR: DELAYED_RELEASE_CAPSULE | ORAL | 1 refills | Status: DC

## 2017-07-28 MED ORDER — CIPROFLOXACIN HCL 500 MG PO TABS: ORAL_TABLET | ORAL | 0 refills | Status: DC

## 2017-07-28 NOTE — Progress Notes (Signed)
Subjective:    Patient ID: Kimberly Ray, female    DOB: 14-Jul-1951, 66 y.o.   MRN: [id number]  HPI  This very nice 66 yo DWF with hx/o GERD with c/o increasing dyspepsia an d LLQ abdominal discomfort since stopping her omeprazole & Ranitidine about 1 - 1.5 months ago. She has had recent increasing episodes of N/V . Denies hematochezia, diarrhea, melena or hematochezia.   Medication Sig  . buPROPion-XL 300 MG  TAKE 1 TABLET DAILY FOR    DEPRESSION  . eletriptan  20 MG tablet Take 1 tab as needed for migraine.  May repeat in 2 hours   . fenofibrate  145 MG tablet Take 1 tablet (145 mg total) by mouth daily.  Marland Kitchen FLUoxetine  20 MG tablet TAKE 1 TABLET TWICE A DAY  . FLONASE nasal spray Place 2 sprays into both nostrils at bedtime.  . lamoTRIgine 100 MG tablet Take 1 tablet (100 mg total) by mouth 2 (two) times daily.  . meclizine  25 MG tablet 1/2-1 pill up to 3 times daily for motion sickness/dizziness  . methylphenidate (METADATE CD) 10 MG CR capsule Take 1 capsule (10 mg total) by mouth 3 (three) times daily as needed.  Marland Kitchen MAXITROL  1 to 1 drops to the affected eye every 1 to 2 hours today, then 4 x /day  . rOPINIRole  0.25 MG tablet Take 1 tablet (0.25 mg total) by mouth at bedtime.  . sucralfate  1 g tablet Take 1 tablet (1 g total) by mouth 4 (four) times daily.  Marland Kitchen albuterol HFA inhaler 1 to 2 inhalations 5 to 10 minutes apart every 4 hours if need   . ALPRAZolam  0.5 MG tablet Take 1 tablet (0.5 mg total) by mouth at bedtime as needed for anxiety. (Patient not taking: Reported on 07/28/2017)  . omeprazole  40 MG capsule NOT Taking  . ondansetron-ODT 8 MG  PLACE 1 TABEVERY 4 HOURS AS  NEEDED FOR NAUSEA (Patient not taking: Reported on 07/28/2017)  . pravastatin  40 MG tablet Take 1 tablet (40 mg total) by mouth every evening. (Patient not taking: Reported on 07/28/2017)  . ranitidine  300 MG tablet Take 1 tablet 2 x daily for acid indigestion & reflux - OFF - NOT taking    Facility-Administered Medications Prior to Visit  Medication Dose Route Frequency Provider Last Rate Last Dose  . ipratropium-albuterol (DUONEB) 0.5-2.5 (3) MG/3ML nebulizer solution 3 mL  3 mL Nebulization Once Forcucci, Courtney, PA-C       Allergies  Allergen Reactions  . Erythromycin Other (See Comments)    TBS  . Ivp Dye [Iodinated Diagnostic Agents] Other (See Comments)    Hot flashes, itching scalp  . Penicillins Hives  . Clarithromycin Other (See Comments)    Itching ankles  . Brintellix [Vortioxetine]     Itching  . Macrodantin [Nitrofurantoin]    Review of Systems   10 point systems review negative except as above.    Objective:   Physical Exam  BP 122/74   Pulse 86   Temp (!) 97.3 F (36.3 C)   Resp 16   Ht 5\' 7"  (1.702 m)   Wt 174 lb 6.4 oz (79.1 kg)   SpO2 95%   BMI 27.31 kg/m   HEENT - Eac's patent. TM's Nl. EOM's full. PERRLA. NasoOroPharynx clear. Neck - supple. Nl Thyroid. Carotids 2+ & No bruits, nodes, JVD Chest - Clear equal BS. Cor - Nl HS. RRR w/o sig MGR. PP  1(+). No edema. Abd - No palpable masses, but soft with LLQ tenderness and no guarding or rebound. BS nl. MS- FROM w/o deformities. Muscle power, tone and bulk Nl. Gait Nl. Neuro - No obvious Cr N abnormalities.  Nl w/o focal abnormalities.    Assessment & Plan:   1. Left lower quadrant pain  - CBC with Differential/Platelet - BASIC METABOLIC PANEL WITH GFR  2. Gastroesophageal reflux disease, esophagitis presence not specified  - CBC with Differential/Platelet - ranitidine (ZANTAC) 300 MG tablet; Take 1 tablet every night for Acid Reflu  Dispense: 90 tablet; Refill: 1 - esomeprazole (NEXIUM) 40 MG capsule; Take 1 capsule every morning for acid indigestion  Dispense: 90 capsule; Refill: 1  3. Diverticulitis  - CBC with Differential/Platelet - BASIC METABOLIC PANEL WITH GFR  - ranitidine (ZANTAC) 300 MG tablet; Take 1 tablet every night for Acid Reflu  Dispense: 90 tablet;  Refill: 1  - ciprofloxacin (CIPRO) 500 MG tablet; Take 1 tablet 2 x/day after meals for infection  Dispense: 20 tablet; Refill: 0 - metroNIDAZOLE (FLAGYL) 500 MG tablet; Take 1 tablet 3 x/day after meals for infection  Dispense: 30 tablet; Refill: 0  4. Nausea and vomiting, intractability of vomiting not specified, unspecified vomiting type  - CBC with Differential/Platelet - BASIC METABOLIC PANEL WITH GFR - Hepatic function panel - Amylase - Lipase  - ondansetron (ZOFRAN) 4 MG tablet; Take 1 to 2 tablets 2 to 3 x / day if needed for Nausea  Dispense: 60 tablet; Refill: 1  5. Iron deficiency anemia  - CBC with Differential/Platelet - Iron,Total/Total Iron Binding Cap - Ferritin  6. Medication management  - Hepatic function panel  - discussed diet, meds & SE's. Further disposition pending labs and clinical improvement.

## 2017-07-28 NOTE — Patient Instructions (Signed)
Food Choices for Gastroesophageal Reflux Disease, Adult When you have gastroesophageal reflux disease (GERD), the foods you eat and your eating habits are very important. Choosing the right foods can help ease the discomfort of GERD. Consider working with a diet and nutrition specialist (dietitian) to help you make healthy food choices. What general guidelines should I follow? Eating plan  Choose healthy foods low in fat, such as fruits, vegetables, whole grains, low-fat dairy products, and lean meat, fish, and poultry.  Eat frequent, small meals instead of three large meals each day. Eat your meals slowly, in a relaxed setting. Avoid bending over or lying down until 2-3 hours after eating.  Limit high-fat foods such as fatty meats or fried foods.  Limit your intake of oils, butter, and shortening to less than 8 teaspoons each day.  Avoid the following: ? Foods that cause symptoms. These may be different for different people. Keep a food diary to keep track of foods that cause symptoms. ? Alcohol. ? Drinking large amounts of liquid with meals. ? Eating meals during the 2-3 hours before bed.  Cook foods using methods other than frying. This may include baking, grilling, or broiling. Lifestyle   Maintain a healthy weight. Ask your health care provider what weight is healthy for you. If you need to lose weight, work with your health care provider to do so safely.  Exercise for at least 30 minutes on 5 or more days each week, or as told by your health care provider.  Avoid wearing clothes that fit tightly around your waist and chest.  Do not use any products that contain nicotine or tobacco, such as cigarettes and e-cigarettes. If you need help quitting, ask your health care provider.  Sleep with the head of your bed raised. Use a wedge under the mattress or blocks under the bed frame to raise the head of the bed. What foods are not recommended? The items listed may not be a complete  list. Talk with your dietitian about what dietary choices are best for you. Grains Pastries or quick breads with added fat. French toast. Vegetables Deep fried vegetables. French fries. Any vegetables prepared with added fat. Any vegetables that cause symptoms. For some people this may include tomatoes and tomato products, chili peppers, onions and garlic, and horseradish. Fruits Any fruits prepared with added fat. Any fruits that cause symptoms. For some people this may include citrus fruits, such as oranges, grapefruit, pineapple, and lemons. Meats and other protein foods High-fat meats, such as fatty beef or pork, hot dogs, ribs, ham, sausage, salami and bacon. Fried meat or protein, including fried fish and fried chicken. Nuts and nut butters. Dairy Whole milk and chocolate milk. Sour cream. Cream. Ice cream. Cream cheese. Milk shakes. Beverages Coffee and tea, with or without caffeine. Carbonated beverages. Sodas. Energy drinks. Fruit juice made with acidic fruits (such as orange or grapefruit). Tomato juice. Alcoholic drinks. Fats and oils Butter. Margarine. Shortening. Ghee. Sweets and desserts Chocolate and cocoa. Donuts. Seasoning and other foods Pepper. Peppermint and spearmint. Any condiments, herbs, or seasonings that cause symptoms. For some people, this may include curry, hot sauce, or vinegar-based salad dressings. Summary  When you have gastroesophageal reflux disease (GERD), food and lifestyle choices are very important to help ease the discomfort of GERD.  Eat frequent, small meals instead of three large meals each day. Eat your meals slowly, in a relaxed setting. Avoid bending over or lying down until 2-3 hours after eating.  Limit high-fat   foods such as fatty meat or fried foods. +++++++++++++++++++++++++++++++   Diverticulitis  Diverticulitis is inflammation or infection of small pouches in your colon that form when you have a condition called diverticulosis. The  pouches in your colon are called diverticula. Your colon, or large intestine, is where water is absorbed and stool is formed. Complications of diverticulitis can include:  Bleeding.  Severe infection.  Severe pain.  Perforation of your colon.  Obstruction of your colon.  What are the causes? Diverticulitis is caused by bacteria. Diverticulitis happens when stool becomes trapped in diverticula. This allows bacteria to grow in the diverticula, which can lead to inflammation and infection. What increases the risk? People with diverticulosis are at risk for diverticulitis. Eating a diet that does not include enough fiber from fruits and vegetables may make diverticulitis more likely to develop. What are the signs or symptoms? Symptoms of diverticulitis may include:  Abdominal pain and tenderness. The pain is normally located on the left side of the abdomen, but may occur in other areas.  Fever and chills.  Bloating.  Cramping.  Nausea.  Vomiting.  Constipation.  Diarrhea.  Blood in your stool.  How is this diagnosed? Your health care provider will ask you about your medical history and do a physical exam. You may need to have tests done because many medical conditions can cause the same symptoms as diverticulitis. Tests may include:  Blood tests.  Urine tests.  Imaging tests of the abdomen, including X-rays and CT scans.  When your condition is under control, your health care provider may recommend that you have a colonoscopy. A colonoscopy can show how severe your diverticula are and whether something else is causing your symptoms. How is this treated? Most cases of diverticulitis are mild and can be treated at home. Treatment may include:  Taking over-the-counter pain medicines.  Following a clear liquid diet.  Taking antibiotic medicines by mouth for 7-10 days.  More severe cases may be treated at a hospital. Treatment may include:  Not eating or  drinking.  Taking prescription pain medicine.  Receiving antibiotic medicines through an IV tube.  Receiving fluids and nutrition through an IV tube.  Surgery.  Follow these instructions at home:  Follow your health care provider's instructions carefully.  Follow a full liquid diet or other diet as directed by your health care provider. After your symptoms improve, your health care provider may tell you to change your diet. He or she may recommend you eat a high-fiber diet. Fruits and vegetables are good sources of fiber. Fiber makes it easier to pass stool.  Take fiber supplements or probiotics as directed by your health care provider.  Only take medicines as directed by your health care provider.  Keep all your follow-up appointments. Contact a health care provider if:  Your pain does not improve.  You have a hard time eating food.  Your bowel movements do not return to normal. Get help right away if:  Your pain becomes worse.  Your symptoms do not get better.  Your symptoms suddenly get worse.  You have a fever.  You have repeated vomiting.  You have bloody or black, tarry stools. This information is not intended to replace advice given to you by your health care provider. Make sure you discuss any questions you have with your health care provider. Document Released: 05/14/2005 Document Revised: 01/10/2016 Document Reviewed: 06/29/2013 Elsevier Interactive Patient Education  2017 [employer].

## 2017-07-29 ENCOUNTER — Encounter: Payer: Self-pay | Admitting: Nurse Practitioner

## 2017-07-29 ENCOUNTER — Ambulatory Visit: Payer: Managed Care, Other (non HMO) | Admitting: Nurse Practitioner

## 2017-07-29 ENCOUNTER — Encounter: Payer: Self-pay | Admitting: Neurology

## 2017-07-29 ENCOUNTER — Other Ambulatory Visit: Payer: Self-pay | Admitting: Internal Medicine

## 2017-07-29 VITALS — BP 105/56 | HR 72 | Ht 67.0 in | Wt 173.4 lb

## 2017-07-29 DIAGNOSIS — G4733 Obstructive sleep apnea (adult) (pediatric): Secondary | ICD-10-CM

## 2017-07-29 DIAGNOSIS — G2581 Restless legs syndrome: Secondary | ICD-10-CM | POA: Diagnosis not present

## 2017-07-29 DIAGNOSIS — Z9989 Dependence on other enabling machines and devices: Secondary | ICD-10-CM

## 2017-07-29 DIAGNOSIS — K219 Gastro-esophageal reflux disease without esophagitis: Secondary | ICD-10-CM

## 2017-07-29 DIAGNOSIS — N289 Disorder of kidney and ureter, unspecified: Secondary | ICD-10-CM

## 2017-07-29 LAB — CBC WITH DIFFERENTIAL/PLATELET
BASOS PCT: 0.6 %
Basophils Absolute: 54 cells/uL (ref 0–200)
EOS ABS: 72 {cells}/uL (ref 15–500)
Eosinophils Relative: 0.8 %
HEMATOCRIT: 38.3 % (ref 35.0–45.0)
Hemoglobin: 12.4 g/dL (ref 11.7–15.5)
LYMPHS ABS: 2376 {cells}/uL (ref 850–3900)
MCH: 27.3 pg (ref 27.0–33.0)
MCHC: 32.4 g/dL (ref 32.0–36.0)
MCV: 84.4 fL (ref 80.0–100.0)
MPV: 10.1 fL (ref 7.5–12.5)
Monocytes Relative: 9.2 %
NEUTROS PCT: 63 %
Neutro Abs: 5670 cells/uL (ref 1500–7800)
Platelets: 398 10*3/uL (ref 140–400)
RBC: 4.54 10*6/uL (ref 3.80–5.10)
RDW: 12.6 % (ref 11.0–15.0)
TOTAL LYMPHOCYTE: 26.4 %
WBC: 9 10*3/uL (ref 3.8–10.8)
WBCMIX: 828 {cells}/uL (ref 200–950)

## 2017-07-29 LAB — IRON, TOTAL/TOTAL IRON BINDING CAP
%SAT: 4 % (calc) — ABNORMAL LOW (ref 11–50)
Iron: 19 ug/dL — ABNORMAL LOW (ref 45–160)
TIBC: 424 mcg/dL (calc) (ref 250–450)

## 2017-07-29 LAB — BASIC METABOLIC PANEL WITH GFR
BUN / CREAT RATIO: 17 (calc) (ref 6–22)
BUN: 23 mg/dL (ref 7–25)
CALCIUM: 10.2 mg/dL (ref 8.6–10.4)
CHLORIDE: 104 mmol/L (ref 98–110)
CO2: 32 mmol/L (ref 20–32)
Creat: 1.32 mg/dL — ABNORMAL HIGH (ref 0.50–0.99)
GFR, EST AFRICAN AMERICAN: 48 mL/min/{1.73_m2} — AB (ref >=60)
GFR, EST NON AFRICAN AMERICAN: 42 mL/min/{1.73_m2} — AB (ref >=60)
Glucose, Bld: 92 mg/dL (ref 65–99)
Potassium: 5.2 mmol/L (ref 3.5–5.3)
Sodium: 143 mmol/L (ref 135–146)

## 2017-07-29 LAB — HEPATIC FUNCTION PANEL
AG RATIO: 2 (calc) (ref 1.0–2.5)
ALBUMIN MSPROF: 4.7 g/dL (ref 3.6–5.1)
ALT: 15 U/L (ref 6–29)
AST: 18 U/L (ref 10–35)
Alkaline phosphatase (APISO): 77 U/L (ref 33–130)
BILIRUBIN DIRECT: 0.1 mg/dL (ref 0.0–0.2)
BILIRUBIN INDIRECT: 0.3 mg/dL (ref 0.2–1.2)
BILIRUBIN TOTAL: 0.4 mg/dL (ref 0.2–1.2)
GLOBULIN: 2.3 g/dL (ref 1.9–3.7)
Total Protein: 7 g/dL (ref 6.1–8.1)

## 2017-07-29 LAB — AMYLASE: AMYLASE: 67 U/L (ref 21–101)

## 2017-07-29 LAB — LIPASE: LIPASE: 32 U/L (ref 7–60)

## 2017-07-29 LAB — FERRITIN: Ferritin: 77 ng/mL (ref 20–288)

## 2017-07-29 MED ORDER — ESOMEPRAZOLE MAGNESIUM 40 MG PO CPDR: DELAYED_RELEASE_CAPSULE | ORAL | 1 refills | Status: DC

## 2017-07-29 MED ORDER — ROPINIROLE HCL 0.25 MG PO TABS: 0.2500 mg | ORAL_TABLET | Freq: Every day | ORAL | 6 refills | Status: DC

## 2017-07-29 NOTE — Progress Notes (Signed)
I reviewed note and agree with plan.   Penni Bombard, MD 37/34/2876, 8:11 PM Certified in Neurology, Neurophysiology and Neuroimaging  [facility] [address] [place], [place] [address] [phone]

## 2017-07-29 NOTE — Progress Notes (Signed)
[facility]  PATIENT: Kimberly Ray DOB: 12-May-1951   REASON FOR VISIT: Follow-up for restless legs and obstructive sleep apnea with initial CPAP data HISTORY FROM: Patient    HISTORY OF PRESENT ILLNESS:Kimberly Ray is a 66 y.o. female , seen here as in a referral/ revisit  from Dr. Melford Aase for a new evaluation of CPAP.   Kimberly Ray had been evaluated at the [facility] in the year 2012, she stated that she did not have follow-up was placed on a CPAP and was basically followed by the medical equipment company for supplies.  She had actually to sleep studies in the past the first one did not find enough apnea to be treated were the, but PLMS and restless legs were documented. In the meantime her sleep habits have changed and her concerns, also. 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 When she is on a " dead head" trip by plane she can sleep, in theory ,but has been asked by passengers if she had RLS ?   She carries a diagnosis of asthma, which has followed bronchitic infections in the past but is not frequent. She also takes methylphenidate for ADD which is really used for daytime sleepiness. She has carried a diagnosis of GERD continues to use a PPI, restless legs started getting on iron supplement, increased her walking.   Seasonal depression . Hep C ab positive. she will have regular screening labs with Vicie Mutters yearly. Labs were reviewed - normal HbA1c.   Sleep habits are as follows: The patient's intended bedtime is before 10 PM but due to her work requirements and residing between [place] and [place] she does have some irregularities. She can fall asleep within minutes she states, she can stay asleep for a period of several hours, and if she wouldn't have a bathroom break in between she would sleep uninterrupted sleep. She has no trouble  sleeping for 12 hours if the opportunity arises. Her restless legs do not keep her from falling asleep. They are more PLMS evident while she sleeping. Her rise time is between 3 and 5 AM depending on which flight she has to attend to. 6 hours of nocturnal sleep. If she has some opportunity to sleep in daytime , she will do so and feels refreshed.  UPDATE 12/12/2018CM [name], 66 year old female returns for follow-up with a history of obstructive sleep apnea newly on CPAP.  In addition she has restless legs with and is on Requip.  She also relates that her father recently died of congestive heart failure the end of October.  CPAP compliance data dated 06/30/2017 to 07/29/2017 shows greater than 4 hours for 27 days and 90%.  Average usage 6 hours 47 minutes.  Set pressure 7 cm.  EPR level 1.  AHI 0.8.  She returns for reevaluation  REVIEW OF SYSTEMS: Full 14 system review of systems performed and notable only for those listed, all others are neg:  Constitutional: neg  Cardiovascular: neg Ear/Nose/Throat: neg  Skin: neg Eyes: neg Respiratory: neg Gastroitestinal: Abdominal pain and nausea Hematology/Lymphatic: neg  Endocrine: neg Musculoskeletal: Joint pain Allergy/Immunology: neg Neurological: neg Psychiatric: neg Sleep : Obstructive sleep apnea with CPAP, restless legs   ALLERGIES: Allergies  Allergen Reactions  . Erythromycin Other (See Comments)    TBS  . Ivp Dye [Iodinated Diagnostic Agents] Other (See Comments)    Hot  flashes, itching scalp  . Penicillins Hives  . Clarithromycin Other (See Comments)    Itching ankles  . Brintellix [Vortioxetine]     Itching  . Macrodantin [Nitrofurantoin]     HOME MEDICATIONS: Outpatient Medications Prior to Visit  Medication Sig Dispense Refill  . albuterol (PROVENTIL HFA;VENTOLIN HFA) 108 (90 Base) MCG/ACT inhaler Use 1 to 2 inhalations 5 to 10 minutes apart every 4 hours if need too rescue Asthma 48 g 3  . buPROPion (WELLBUTRIN XL) 300  MG 24 hr tablet TAKE 1 TABLET DAILY FOR    DEPRESSION 90 tablet 3  . ciprofloxacin (CIPRO) 500 MG tablet Take 1 tablet 2 x/day after meals for infection 20 tablet 0  . eletriptan (RELPAX) 20 MG tablet Take 1 tablet (20 mg total) by mouth as needed for migraine or headache. 1 po at onset of headache. May repeat in 2 hours if headache persists or recurs. 24 tablet 3  . esomeprazole (NEXIUM) 40 MG capsule Take 1 capsule every morning for acid indigestion 90 capsule 1  . fenofibrate (TRICOR) 145 MG tablet Take 1 tablet (145 mg total) by mouth daily. 30 tablet 3  . FLUoxetine (PROZAC) 20 MG tablet TAKE 1 TABLET TWICE A DAY 180 tablet 1  . fluticasone (FLONASE) 50 MCG/ACT nasal spray Place 2 sprays into both nostrils at bedtime. 16 g 1  . lamoTRIgine (LAMICTAL) 100 MG tablet Take 1 tablet (100 mg total) by mouth 2 (two) times daily. 180 tablet 1  . meclizine (ANTIVERT) 25 MG tablet 1/2-1 pill up to 3 times daily for motion sickness/dizziness 30 tablet 0  . methylphenidate (METADATE CD) 10 MG CR capsule Take 1 capsule (10 mg total) by mouth 3 (three) times daily as needed. 90 capsule 0  . metroNIDAZOLE (FLAGYL) 500 MG tablet Take 1 tablet 3 x/day after meals for infection 30 tablet 0  . neomycin-polymyxin b-dexamethasone (MAXITROL) 3.5-10000-0.1 SUSP 1 to 1 drops to the affected eye every 1 to 2 hours today, then 4 x /day 5 mL 1  . ondansetron (ZOFRAN) 4 MG tablet Take 1 to 2 tablets 2 to 3 x / day if needed for Nausea 60 tablet 1  . ranitidine (ZANTAC) 300 MG tablet Take 1 tablet every night for Acid Reflu 90 tablet 1  . rOPINIRole (REQUIP) 0.25 MG tablet Take 1 tablet (0.25 mg total) by mouth at bedtime. 30 tablet 3  . sucralfate (CARAFATE) 1 g tablet Take 1 tablet (1 g total) by mouth 4 (four) times daily. 120 tablet 1   Facility-Administered Medications Prior to Visit  Medication Dose Route Frequency Provider Last Rate Last Dose  . ipratropium-albuterol (DUONEB) 0.5-2.5 (3) MG/3ML nebulizer solution  3 mL  3 mL Nebulization Once Starlyn Skeans, PA-C        PAST MEDICAL HISTORY: Past Medical History:  Diagnosis Date  . Anemia   . Depression   . GERD (gastroesophageal reflux disease)   . Hay fever   . Head trauma   . IBS (irritable bowel syndrome)   . Memory loss   . Migraines   . OSA on CPAP   . Reflux   . RLS (restless legs syndrome)   . Sinus congestion     PAST SURGICAL HISTORY: Past Surgical History:  Procedure Laterality Date  . BREAST BIOPSY Right 02/27/2014   Stereo- Benign  . kidney stone removed    . NASAL SINUS SURGERY    . perforated eardrum     left side  . TONSILLECTOMY AND  ADENOIDECTOMY      FAMILY HISTORY: Family History  Problem Relation Age of Onset  . Diabetes Mother   . Diabetes Sister   . Breast cancer Maternal Grandmother 36  . Breast cancer Maternal Aunt 23  . Breast cancer Cousin     SOCIAL HISTORY: Social History   Socioeconomic History  . Marital status: Divorced    Spouse name: Not on file  . Number of children: 2  . Years of education: 30  . Highest education level: Not on file  Social Needs  . Financial resource strain: Not on file  . Food insecurity - worry: Not on file  . Food insecurity - inability: Not on file  . Transportation needs - medical: Not on file  . Transportation needs - non-medical: Not on file  Occupational History    Employer: Special educational needs teacher    Comment: Flight Attendant  Tobacco Use  . Smoking status: Never Smoker  . Smokeless tobacco: Never Used  Substance and Sexual Activity  . Alcohol use: Yes    Alcohol/week: 0.6 oz    Types: 1 Shots of liquor per week    Comment: Once a year  . Drug use: No  . Sexual activity: Not on file  Other Topics Concern  . Not on file  Social History Narrative   Patient is single and lives at home alone. Patient is flight attendant. Patient has college education one year      Right handed.   Caffeine- Rare.      PHYSICAL EXAM  Vitals:   07/29/17 1354    BP: (!) 105/56  Pulse: 72  Weight: 173 lb 6.4 oz (78.7 kg)  Height: 5\' 7"  (1.702 m)   Body mass index is 27.16 kg/m.  Generalized: Well developed, in no acute distress  Head: normocephalic and atraumatic,. Oropharynx benign  Neck: Supple,  Musculoskeletal: No deformity   Neurological examination   Mentation: Alert oriented to time, place, history taking. Attention span and concentration appropriate. Recent and remote memory intact.  Follows all commands speech and language fluent.   Cranial nerve II-XII: .Pupils were equal round reactive to light extraocular movements were full, visual field were full on confrontational test. Facial sensation and strength were normal. hearing was intact to finger rubbing bilaterally. Uvula tongue midline. head turning and shoulder shrug were normal and symmetric.Tongue protrusion into cheek strength was normal. Motor: normal bulk and tone, full strength in the BUE, BLE, fine finger movements normal, no pronator drift. No focal weakness Sensory: normal and symmetric to light touch, in the upper and lower extremities Coordination: finger-nose-finger, heel-to-shin bilaterally, no dysmetria Gait and Station: Rising up from seated position without assistance, normal stance,  moderate stride, good arm swing, smooth turning, able to perform tiptoe, and heel walking without difficulty. Tandem gait is steady  DIAGNOSTIC DATA (LABS, IMAGING, TESTING) - I reviewed patient records, labs, notes, testing and imaging myself where available.  Lab Results  Component Value Date   WBC 9.0 07/28/2017   HGB 12.4 07/28/2017   HCT 38.3 07/28/2017   MCV 84.4 07/28/2017   PLT 398 07/28/2017      Component Value Date/Time   NA 143 07/28/2017 1710   K 5.2 07/28/2017 1710   CL 104 07/28/2017 1710   CO2 32 07/28/2017 1710   GLUCOSE 92 07/28/2017 1710   BUN 23 07/28/2017 1710   CREATININE 1.32 (H) 07/28/2017 1710   CALCIUM 10.2 07/28/2017 1710   PROT 7.0 07/28/2017  1710   ALBUMIN 4.2  01/20/2017 1006   AST 18 07/28/2017 1710   ALT 15 07/28/2017 1710   ALKPHOS 86 01/20/2017 1006   BILITOT 0.4 07/28/2017 1710   GFRNONAA 42 (L) 07/28/2017 1710   GFRAA 48 (L) 07/28/2017 1710   Lab Results  Component Value Date   CHOL 236 (H) 05/13/2017   HDL 43 (L) 05/13/2017   LDLCALC 74 01/20/2017   TRIG 598 (H) 05/13/2017   CHOLHDL 5.5 (H) 05/13/2017   Lab Results  Component Value Date   HGBA1C 5.3 06/12/2016   Lab Results  Component Value Date   VITAMINB12 708 12/09/2016   Lab Results  Component Value Date   TSH 2.02 05/13/2017      ASSESSMENT AND PLAN  66 y.o. year old female  has a past medical history of Anemia, Depression, GERD (gastroesophageal reflux disease), Hay fever, Head trauma, IBS (irritable bowel syndrome), Memory loss, Migraines, OSA on CPAP, Reflux, RLS (restless legs syndrome), and Sinus congestion. here to follow-up for initial CPAP compliance and restless legs.  Patient also recently lost her father to congestive heart failure.The patient is a current patient of Dr. Brett Fairy  who is out of the office today . This note is sent to the work in doctor.    CPAP compliance 90% Continue same settings We will refill her Requip for restless legs Recommend bereavement therapy for the recent loss of her dad Follow-up in 6 months Dennie Bible, [facility], [facility], [facility] [address], [place], [place] [address] [phone]

## 2017-07-29 NOTE — Patient Instructions (Signed)
CPAP compliance 90% Continue same settings We will refill her Requip for restless legs Recommend bereavement therapy for the recent loss of her dad Follow-up in 6 months

## 2017-07-30 ENCOUNTER — Other Ambulatory Visit: Payer: Self-pay | Admitting: *Deleted

## 2017-07-30 MED ORDER — PANTOPRAZOLE SODIUM 40 MG PO TBEC: 40.0000 mg | DELAYED_RELEASE_TABLET | Freq: Every day | ORAL | 1 refills | Status: DC

## 2017-07-31 ENCOUNTER — Ambulatory Visit: Payer: Self-pay | Admitting: Nurse Practitioner

## 2017-08-06 ENCOUNTER — Encounter: Payer: Self-pay | Admitting: Internal Medicine

## 2017-08-19 DIAGNOSIS — D500 Iron deficiency anemia secondary to blood loss (chronic): Secondary | ICD-10-CM | POA: Diagnosis not present

## 2017-08-19 DIAGNOSIS — Z86010 Personal history of colonic polyps: Secondary | ICD-10-CM | POA: Diagnosis not present

## 2017-08-19 DIAGNOSIS — K5730 Diverticulosis of large intestine without perforation or abscess without bleeding: Secondary | ICD-10-CM | POA: Diagnosis not present

## 2017-08-19 DIAGNOSIS — R1013 Epigastric pain: Secondary | ICD-10-CM | POA: Diagnosis not present

## 2017-08-24 NOTE — Progress Notes (Deleted)
Complete Physical  Assessment and Plan:  Hyperlipidemia -cont diet and exercise - Lipid panel   Hyperglycemia Discussed general issues about diabetes pathophysiology and management., Educational material distributed., Suggested low cholesterol diet., Encouraged aerobic exercise., Discussed foot care., Reminded to get yearly retinal exam. Stop metformin for now   Essential hypertension - continue medications, DASH diet, exercise and monitor at home. Call if greater than 130/80.  - Urinalysis, Routine w reflex microscopic (not at [facility]) - Microalbumin / creatinine urine ratio - EKG 12-Lead - TSH   Vitamin D deficiency - VITAMIN D 25 Hydroxy (Vit-D Deficiency, Fractures)  ADD (attention deficit disorder) - methylphenidate (METADATE CD) 10 MG CR capsule; Take 1 capsule (10 mg total) by mouth 3 (three) times daily as needed.  Dispense: 90 capsule; Refill: 0  Bipolar depression ([place]) Continue medications ? Need to switch to cymbalta for possible FM  Discussed med's effects and SE's. Screening labs and tests as requested with regular follow-up as recommended. Future Appointments  Date Time Provider [place]  08/26/2017  8:45 AM Vicie Mutters, PA-C [facility] None  01/21/2018  9:00 AM Vicie Mutters, PA-C [facility] None  01/27/2018  1:15 PM Dennie Bible, NP [facility] None     HPI  67 y.o. female  presents for a 4 month follow up.   Her blood pressure has been controlled at home, today their BP is  .  She does not workout. She denies chest pain, shortness of breath, dizziness.  She is on lamictal and ADD medication for bipolar depression.  She is NOT on cholesterol medication, she stopped with pravastatin due to knee pain, states they are better.  Her cholesterol is not at goal. The cholesterol last visit was:  Lab Results  Component Value Date   CHOL 236 (H) 05/13/2017   HDL 43 (L) 05/13/2017   LDLCALC 74 01/20/2017   TRIG 598 (H) 05/13/2017   CHOLHDL 5.5 (H)  05/13/2017  . She has been working on diet and exercise for prediabetes, she is not on bASA, she is on ACE/ARB and denies foot ulcerations, hyperglycemia, hypoglycemia , increased appetite, nausea, paresthesia of the feet, polydipsia, polyuria, visual disturbances, vomiting and weight loss. Last A1C in the office was:  Lab Results  Component Value Date   HGBA1C 5.3 06/12/2016  She is not checking blood sugars.  She is taking 1 metformin daily.    Patient is on Vitamin D supplement.   Lab Results  Component Value Date   VD25OH 35 05/13/2017     She reports that she is taking the prilosec daily.  She did have an endoscopy.  She is also taking zantac also.  She has RLS, last iron was low, b12 normal, mag and CPK normal, started back on iron but states worsened GERD so will stop for now.  Lab Results  Component Value Date   IRON 19 (L) 07/28/2017   TIBC 424 07/28/2017   FERRITIN 77 07/28/2017   BMI is There is no height or weight on file to calculate BMI., she is working on diet and exercise. She has OSA and is on CPAP.  Wt Readings from Last 3 Encounters:  07/29/17 173 lb 6.4 oz (78.7 kg)  07/28/17 174 lb 6.4 oz (79.1 kg)  05/20/17 174 lb 9.6 oz (79.2 kg)    Current Medications:  Current Outpatient Medications on File Prior to Visit  Medication Sig  . albuterol (PROVENTIL HFA;VENTOLIN HFA) 108 (90 Base) MCG/ACT inhaler Use 1 to 2 inhalations 5 to 10  minutes apart every 4 hours if need too rescue Asthma  . buPROPion (WELLBUTRIN XL) 300 MG 24 hr tablet TAKE 1 TABLET DAILY FOR    DEPRESSION  . ciprofloxacin (CIPRO) 500 MG tablet Take 1 tablet 2 x/day after meals for infection  . eletriptan (RELPAX) 20 MG tablet Take 1 tablet (20 mg total) by mouth as needed for migraine or headache. 1 po at onset of headache. May repeat in 2 hours if headache persists or recurs.  . fenofibrate (TRICOR) 145 MG tablet Take 1 tablet (145 mg total) by mouth daily.  Marland Kitchen FLUoxetine (PROZAC) 20 MG tablet TAKE 1  TABLET TWICE A DAY  . fluticasone (FLONASE) 50 MCG/ACT nasal spray Place 2 sprays into both nostrils at bedtime.  . lamoTRIgine (LAMICTAL) 100 MG tablet Take 1 tablet (100 mg total) by mouth 2 (two) times daily.  . meclizine (ANTIVERT) 25 MG tablet 1/2-1 pill up to 3 times daily for motion sickness/dizziness  . methylphenidate (METADATE CD) 10 MG CR capsule Take 1 capsule (10 mg total) by mouth 3 (three) times daily as needed.  . metroNIDAZOLE (FLAGYL) 500 MG tablet Take 1 tablet 3 x/day after meals for infection  . neomycin-polymyxin b-dexamethasone (MAXITROL) 3.5-10000-0.1 SUSP 1 to 1 drops to the affected eye every 1 to 2 hours today, then 4 x /day  . ondansetron (ZOFRAN) 4 MG tablet Take 1 to 2 tablets 2 to 3 x / day if needed for Nausea  . pantoprazole (PROTONIX) 40 MG tablet Take 1 tablet (40 mg total) by mouth daily.  . ranitidine (ZANTAC) 300 MG tablet Take 1 tablet every night for Acid Reflu  . rOPINIRole (REQUIP) 0.25 MG tablet Take 1 tablet (0.25 mg total) by mouth at bedtime.  . sucralfate (CARAFATE) 1 g tablet Take 1 tablet (1 g total) by mouth 4 (four) times daily.   Current Facility-Administered Medications on File Prior to Visit  Medication  . ipratropium-albuterol (DUONEB) 0.5-2.5 (3) MG/3ML nebulizer solution 3 mL    Medical History:  Past Medical History:  Diagnosis Date  . Anemia   . Depression   . GERD (gastroesophageal reflux disease)   . Hay fever   . Head trauma   . IBS (irritable bowel syndrome)   . Memory loss   . Migraines   . OSA on CPAP   . Reflux   . RLS (restless legs syndrome)   . Sinus congestion     Allergies Allergies  Allergen Reactions  . Erythromycin Other (See Comments)    TBS  . Ivp Dye [Iodinated Diagnostic Agents] Other (See Comments)    Hot flashes, itching scalp  . Penicillins Hives  . Clarithromycin Other (See Comments)    Itching ankles  . Brintellix [Vortioxetine]     Itching  . Macrodantin [Nitrofurantoin]    Surgical  History: reviewed and unchanged Family History: reviewed and unchanged Social History: reviewed and unchanged   Review of Systems: Review of Systems  Constitutional: Positive for malaise/fatigue. Negative for chills and fever.  HENT: Negative for congestion, ear pain and sore throat.   Respiratory: Negative for cough, shortness of breath and wheezing.   Cardiovascular: Negative for chest pain, palpitations and leg swelling.  Gastrointestinal: Positive for heartburn. Negative for abdominal pain, blood in stool, constipation, diarrhea and melena.  Genitourinary: Negative.   Neurological: Negative for dizziness, loss of consciousness and headaches (occasional migraines).  Psychiatric/Behavioral: Negative for depression. The patient is nervous/anxious. The patient does not have insomnia.     Physical Exam:  Estimated body mass index is 27.16 kg/m as calculated from the following:   Height as of 07/29/17: 5\' 7"  (1.702 m).   Weight as of 07/29/17: 173 lb 6.4 oz (78.7 kg). There were no vitals taken for this visit.  Wt Readings from Last 3 Encounters:  07/29/17 173 lb 6.4 oz (78.7 kg)  07/28/17 174 lb 6.4 oz (79.1 kg)  05/20/17 174 lb 9.6 oz (79.2 kg)    General Appearance: Well nourished well developed, in no apparent distress.  Eyes: PERRLA, EOMs, conjunctiva no swelling or erythema ENT/Mouth: Ear canals normal without obstruction, swelling, erythema, or discharge.  TMs normal bilaterally with no erythema, bulging, retraction, or loss of landmark.  Oropharynx moist and clear with no exudate, erythema, or swelling.   Neck: Supple, thyroid normal. No bruits.  No cervical adenopathy Respiratory: Respiratory effort normal, Breath sounds clear A&P without wheeze, rhonchi, rales.   Cardio: RRR without murmurs, rubs or gallops. Brisk peripheral pulses without edema.  Chest: symmetric, with normal excursions Breasts: Symmetric, without lumps, nipple discharge, retractions.  Abdomen: Soft,  nontender, no guarding, rebound, hernias, masses, or organomegaly.  Lymphatics: Non tender without lymphadenopathy.   Musculoskeletal: Full ROM all peripheral extremities,5/5 strength, and normal gait.  Skin: Warm, dry without rashes, lesions, ecchymosis. Neuro: Awake and oriented X 3, Cranial nerves intact, reflexes equal bilaterally. Normal muscle tone, no cerebellar symptoms. Sensation intact.  Psych:  normal affect, Insight and Judgment appropriate.    Vicie Mutters 12:47 PM [facility]

## 2017-08-26 ENCOUNTER — Ambulatory Visit: Payer: Self-pay | Admitting: Physician Assistant

## 2017-08-26 ENCOUNTER — Ambulatory Visit: Payer: BLUE CROSS/BLUE SHIELD | Admitting: Physician Assistant

## 2017-08-26 ENCOUNTER — Encounter: Payer: Self-pay | Admitting: Physician Assistant

## 2017-08-26 VITALS — BP 110/74 | HR 78 | Resp 16 | Ht 67.0 in | Wt 177.0 lb

## 2017-08-26 DIAGNOSIS — R160 Hepatomegaly, not elsewhere classified: Secondary | ICD-10-CM | POA: Diagnosis not present

## 2017-08-26 DIAGNOSIS — M791 Myalgia, unspecified site: Secondary | ICD-10-CM | POA: Diagnosis not present

## 2017-08-26 DIAGNOSIS — R0600 Dyspnea, unspecified: Secondary | ICD-10-CM | POA: Diagnosis not present

## 2017-08-26 DIAGNOSIS — D649 Anemia, unspecified: Secondary | ICD-10-CM | POA: Diagnosis not present

## 2017-08-26 DIAGNOSIS — F319 Bipolar disorder, unspecified: Secondary | ICD-10-CM

## 2017-08-26 DIAGNOSIS — Z79899 Other long term (current) drug therapy: Secondary | ICD-10-CM | POA: Diagnosis not present

## 2017-08-26 DIAGNOSIS — F3130 Bipolar disorder, current episode depressed, mild or moderate severity, unspecified: Secondary | ICD-10-CM | POA: Diagnosis not present

## 2017-08-26 DIAGNOSIS — E782 Mixed hyperlipidemia: Secondary | ICD-10-CM

## 2017-08-26 DIAGNOSIS — F988 Other specified behavioral and emotional disorders with onset usually occurring in childhood and adolescence: Secondary | ICD-10-CM

## 2017-08-26 DIAGNOSIS — R030 Elevated blood-pressure reading, without diagnosis of hypertension: Secondary | ICD-10-CM

## 2017-08-26 DIAGNOSIS — G2581 Restless legs syndrome: Secondary | ICD-10-CM

## 2017-08-26 MED ORDER — FENOFIBRATE 145 MG PO TABS: 145.0000 mg | ORAL_TABLET | Freq: Every day | ORAL | 3 refills | Status: DC

## 2017-08-26 MED ORDER — DULOXETINE HCL 30 MG PO CPEP: 30.0000 mg | ORAL_CAPSULE | Freq: Every day | ORAL | 2 refills | Status: DC

## 2017-08-26 NOTE — Progress Notes (Signed)
Assessment and Plan:   Bipolar depression ([place]) Has stopped all of her medications Will start on cymabalta Suggest counseling  Elevated BP without diagnosis of hypertension - continue medications, DASH diet, exercise and monitor at home. Call if greater than 130/80.  -     TSH  Vitamin D deficiency -     VITAMIN D 25 Hydroxy (Vit-D Deficiency, Fractures)  Mixed hyperlipidemia -     Lipid panel -     fenofibrate (TRICOR) 145 MG tablet; Take 1 tablet (145 mg total) by mouth daily.  Anemia, unspecified type -     CBC with Differential/Platelet -     Iron,Total/Total Iron Binding Cap -     Methylmalonic acid, serum  Attention deficit disorder, unspecified hyperactivity presence Off meds at this time  Hepatomegaly -     US Abdomen Complete; Future  Myalgia ? FM start on cymabalta, increase fluids -     Uric acid -     Sedimentation rate -     CK  Dyspnea, unspecified type Declines EKG/CXR at this time, no CP, recheck iron   Continue diet and meds as discussed. Further disposition pending results of labs. Discussed med's effects and SE's.   Future Appointments  Date Time Provider [place]  10/15/2017  3:45 PM Vicie Mutters, PA-C [facility] None  01/21/2018  9:00 AM Vicie Mutters, PA-C [facility] None  01/27/2018  1:15 PM Dennie Bible, NP [facility] None    HPI 68 y.o. female  presents for 3 month follow up with hypertension, hyperlipidemia, diabetes and vitamin D deficiency.  She has been feeling bad.  She will sleep 10-12 hours, on her CPAP but still feels tired in the AM.  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 She had abnormal iron and kidney studies last visit. She has stopped all her medications because she was not feeling well, stopped lamictal, PPI, reglan,prozac, wellbutrin . She is scheduled for a colonoscopy and EGD on  12-13-2022.Father recently died in OCT from CHF.  Normal MGM 11/2016 CXR 03/2016 CT AB 02/2014 fatty liver   Lab Results  Component Value Date   IRON 19 (L) 07/28/2017   TIBC 424 07/28/2017   FERRITIN 77 07/28/2017   Lab Results  Component Value Date   GFRNONAA 42 (L) 07/28/2017    Her blood pressure has been controlled at home, today their BP is BP: 110/74.She does not workout. She denies chest pain, shortness of breath, dizziness.   She is on cholesterol medication and denies myalgias. Her cholesterol is not at goal. The cholesterol was:  01/20/2017: LDL Cholesterol 74 05/13/2017: Cholesterol 236; HDL 43; Triglycerides 598   She has no DM or PreDM Lab Results  Component Value Date   HGBA1C 5.3 06/12/2016   Patient is on Vitamin D supplement. 05/13/2017: Vit D, 25-Hydroxy 35  BMI is Body mass index is 27.72 kg/m., she is working on diet and exercise. She had sleep study and is going to get on CPAP, AHI 13.  Wt Readings from Last 3 Encounters:  08/26/17 177 lb (80.3 kg)  07/29/17 173 lb 6.4 oz (78.7 kg)  07/28/17 174 lb 6.4 oz (79.1 kg)     Current Medications:  Current Outpatient Medications on File Prior to Visit  Medication Sig Dispense Refill  . eletriptan (RELPAX) 20 MG tablet Take 1 tablet (20 mg total) by mouth as needed for migraine  or headache. 1 po at onset of headache. May repeat in 2 hours if headache persists or recurs. 24 tablet 3  . ondansetron (ZOFRAN) 4 MG tablet Take 1 to 2 tablets 2 to 3 x / day if needed for Nausea 60 tablet 1  . rOPINIRole (REQUIP) 0.25 MG tablet Take 1 tablet (0.25 mg total) by mouth at bedtime. 30 tablet 6  . sucralfate (CARAFATE) 1 g tablet Take 1 tablet (1 g total) by mouth 4 (four) times daily. 120 tablet 1   Current Facility-Administered Medications on File Prior to Visit  Medication Dose Route Frequency Provider Last Rate Last Dose  . ipratropium-albuterol (DUONEB) 0.5-2.5 (3) MG/3ML nebulizer solution 3 mL  3 mL Nebulization Once  Starlyn Skeans, PA-C       Medical History:  Past Medical History:  Diagnosis Date  . Anemia   . Depression   . GERD (gastroesophageal reflux disease)   . Hay fever   . Head trauma   . IBS (irritable bowel syndrome)   . Memory loss   . Migraines   . OSA on CPAP   . Reflux   . RLS (restless legs syndrome)   . Sinus congestion    Allergies:  Allergies  Allergen Reactions  . Erythromycin Other (See Comments)    TBS  . Ivp Dye [Iodinated Diagnostic Agents] Other (See Comments)    Hot flashes, itching scalp  . Penicillins Hives  . Clarithromycin Other (See Comments)    Itching ankles  . Brintellix [Vortioxetine]     Itching  . Macrodantin [Nitrofurantoin]      Review of Systems:  Review of Systems  see HPI  Family history- Review and unchanged  Social history- Review and unchanged  Physical Exam: BP 110/74   Pulse 78   Resp 16   Ht 5\' 7"  (1.702 m)   Wt 177 lb (80.3 kg)   SpO2 98%   BMI 27.72 kg/m  Wt Readings from Last 3 Encounters:  08/26/17 177 lb (80.3 kg)  07/29/17 173 lb 6.4 oz (78.7 kg)  07/28/17 174 lb 6.4 oz (79.1 kg)   General Appearance: Well nourished well developed, non-toxic appearing, in no apparent distress. Eyes: PERRLA, EOMs, conjunctiva no swelling or erythema ENT/Mouth: Ear canals clear with no erythema, swelling, or discharge.  TMs normal bilaterally, oropharynx clear, moist, with no exudate.   Neck: Supple, thyroid normal, no JVD, no cervical adenopathy.  Respiratory: Respiratory effort normal, breath sounds clear A&P, no wheeze, rhonchi or rales noted.  No retractions, no accessory muscle usage Cardio: RRR with no MRGs. No noted edema.  Abdomen: Soft, + BS.  + RUQ tender, with possible hepatomegaly, no guarding, rebound, hernias, masses. Musculoskeletal: Full ROM, 5/5 strength, Normal gait Skin: Warm, dry without rashes, lesions, ecchymosis.  Neuro: Awake and oriented X 3, Cranial nerves intact. No cerebellar symptoms.  Psych:  normal affect, Insight and Judgment appropriate.    Vicie Mutters, PA-C 5:29 PM [facility]

## 2017-08-26 NOTE — Patient Instructions (Addendum)
Continue the requip Add on Cymbalta 30mg  for 2 weeks, if you do well with it we can go up to 60mg  if needed After 2 weeks if doing well add on fenofibrate for trigs  Food Choices to Lower Your Triglycerides Triglycerides are a type of fat in your blood. High levels of triglycerides can increase the risk of heart disease and stroke. If your triglyceride levels are high, the foods you eat and your eating habits are very important. Choosing the right foods can help lower your triglycerides. What general guidelines do I need to follow?  Lose weight if you are overweight.  Limit or avoid alcohol.  Fill one half of your plate with vegetables and green salads.  Limit fruit to two servings a day. Choose fruit instead of juice.  Make one fourth of your plate whole grains. Look for the word "whole" as the first word in the ingredient list.  Fill one fourth of your plate with lean protein foods.  Enjoy fatty fish (such as salmon, mackerel, sardines, and tuna) three times a week.  Choose healthy fats.  Limit foods high in starch and sugar.  Eat more home-cooked food and less restaurant, buffet, and fast food.  Limit fried foods.  Cook foods using methods other than frying.  Limit saturated fats.  Check ingredient lists to avoid foods with partially hydrogenated oils (trans fats) in them. What foods can I eat? Grains Whole grains, such as whole wheat or whole grain breads, crackers, cereals, and pasta. Unsweetened oatmeal, bulgur, barley, quinoa, or brown rice. Corn or whole wheat flour tortillas. Vegetables Fresh or frozen vegetables (raw, steamed, roasted, or grilled). Green salads. Fruits All fresh, canned (in natural juice), or frozen fruits. Meat and Other Protein Products Ground beef (85% or leaner), grass-fed beef, or beef trimmed of fat. Skinless chicken or [place]. Ground chicken or [place]. Pork trimmed of fat. All fish and seafood. Eggs. Dried beans, peas, or lentils. Unsalted  nuts or seeds. Unsalted canned or dry beans. Dairy Low-fat dairy products, such as skim or 1% milk, 2% or reduced-fat cheeses, low-fat ricotta or cottage cheese, or plain low-fat yogurt. Fats and Oils Tub margarines without trans fats. Light or reduced-fat mayonnaise and salad dressings. Avocado. Safflower, olive, or canola oils. Natural peanut or almond butter. The items listed above may not be a complete list of recommended foods or beverages. Contact your dietitian for more options. What foods are not recommended? Grains White bread. White pasta. White rice. Cornbread. Bagels, pastries, and croissants. Crackers that contain trans fat. Vegetables White potatoes. Corn. Creamed or fried vegetables. Vegetables in a cheese sauce. Fruits Dried fruits. Canned fruit in light or heavy syrup. Fruit juice. Meat and Other Protein Products Fatty cuts of meat. Ribs, chicken wings, bacon, sausage, bologna, salami, chitterlings, fatback, hot dogs, bratwurst, and packaged luncheon meats. Dairy Whole or 2% milk, cream, half-and-half, and cream cheese. Whole-fat or sweetened yogurt. Full-fat cheeses. Nondairy creamers and whipped toppings. Processed cheese, cheese spreads, or cheese curds. Sweets and Desserts Corn syrup, sugars, honey, and molasses. Candy. Jam and jelly. Syrup. Sweetened cereals. Cookies, pies, cakes, donuts, muffins, and ice cream. Fats and Oils Butter, stick margarine, lard, shortening, ghee, or bacon fat. Coconut, palm kernel, or palm oils. Beverages Alcohol. Sweetened drinks (such as sodas, lemonade, and fruit drinks or punches). The items listed above may not be a complete list of foods and beverages to avoid. Contact your dietitian for more information. This information is not intended to replace advice given to  you by your health care provider. Make sure you discuss any questions you have with your health care provider. Document Released: 05/22/2004 Document Revised: 01/10/2016  Document Reviewed: 06/08/2013 Elsevier Interactive Patient Education  2017 [place].    Myofascial Pain Syndrome and Fibromyalgia Myofascial pain syndrome and fibromyalgia are both pain disorders. This pain may be felt mainly in your muscles.  Myofascial pain syndrome: ? Always has trigger points or tender points in the muscle that will cause pain when pressed. The pain may come and go. ? Usually affects your neck, upper back, and shoulder areas. The pain often radiates into your arms and hands.  Fibromyalgia: ? Has muscle pains and tenderness that come and go. ? Is often associated with fatigue and sleep disturbances. ? Has trigger points. ? Tends to be long-lasting (chronic), but is not life-threatening.  Fibromyalgia and myofascial pain are not the same. However, they often occur together. If you have both conditions, each can make the other worse. Both are common and can cause enough pain and fatigue to make day-to-day activities difficult. What are the causes? The exact causes of fibromyalgia and myofascial pain are not known. People with certain gene types may be more likely to develop fibromyalgia. Some factors can be triggers for both conditions, such as:  Spine disorders.  Arthritis.  Severe injury (trauma) and other physical stressors.  Being under a lot of stress.  A medical illness.  What are the signs or symptoms? Fibromyalgia The main symptom of fibromyalgia is widespread pain and tenderness in your muscles. This can vary over time. Pain is sometimes described as stabbing, shooting, or burning. You may have tingling or numbness, too. You may also have sleep problems and fatigue. You may wake up feeling tired and groggy (fibro fog). Other symptoms may include:  Bowel and bladder problems.  Headaches.  Visual problems.  Problems with odors and noises.  Depression or mood changes.  Painful menstrual periods (dysmenorrhea).  Dry skin or  eyes.  Myofascial pain syndrome Symptoms of myofascial pain syndrome include:  Tight, ropy bands of muscle.  Uncomfortable sensations in muscular areas, such as: ? Aching. ? Cramping. ? Burning. ? Numbness. ? Tingling. ? Muscle weakness.  Trouble moving certain muscles freely (range of motion).  How is this diagnosed? There are no specific tests to diagnose fibromyalgia or myofascial pain syndrome. Both can be hard to diagnose because their symptoms are common in many other conditions. Your health care provider may suspect one or both of these conditions based on your symptoms and medical history. Your health care provider will also do a physical exam. The key to diagnosing fibromyalgia is having pain, fatigue, and other symptoms for more than three months that cannot be explained by another condition. The key to diagnosing myofascial pain syndrome is finding trigger points in muscles that are tender and cause pain elsewhere in your body (referred pain). How is this treated? Treating fibromyalgia and myofascial pain often requires a team of health care providers. This usually starts with your primary provider and a physical therapist. You may also find it helpful to work with alternative health care providers, such as massage therapists or acupuncturists. Treatment for fibromyalgia may include medicines. This may include nonsteroidal anti-inflammatory drugs (NSAIDs), along with other medicines. Treatment for myofascial pain may also include:  NSAIDs.  Cooling and stretching of muscles.  Trigger point injections.  Sound wave (ultrasound) treatments to stimulate muscles.  Follow these instructions at home:  Take medicines only as directed  by your health care provider.  Exercise as directed by your health care provider or physical therapist.  Try to avoid stressful situations.  Practice relaxation techniques to control your stress. You may want to try: ? Biofeedback. ? Visual  imagery. ? Hypnosis. ? Muscle relaxation. ? Yoga. ? Meditation.  Talk to your health care provider about alternative treatments, such as acupuncture or massage treatment.  Maintain a healthy lifestyle. This includes eating a healthy diet and getting enough sleep.  Consider joining a support group.  Do not do activities that stress or strain your muscles. That includes repetitive motions and heavy lifting. Where to find more information:  National Fibromyalgia Association: www.fmaware.[place]: www.arthritis.org  American Chronic Pain Association: [employer].dk Contact a health care provider if:  You have new symptoms.  Your symptoms get worse.  You have side effects from your medicines.  You have trouble sleeping.  Your condition is causing depression or anxiety. This information is not intended to replace advice given to you by your health care provider. Make sure you discuss any questions you have with your health care provider. Document Released: 08/04/2005 Document Revised: 01/10/2016 Document Reviewed: 05/10/2014 Elsevier Interactive Patient Education  [employer].

## 2017-08-28 ENCOUNTER — Ambulatory Visit
Admission: RE | Admit: 2017-08-28 | Discharge: 2017-08-28 | Disposition: A | Discharge status: Home / Self Care | Payer: BLUE CROSS/BLUE SHIELD | Source: Ambulatory Visit | Attending: Physician Assistant | Admitting: Physician Assistant

## 2017-08-28 DIAGNOSIS — R160 Hepatomegaly, not elsewhere classified: Secondary | ICD-10-CM

## 2017-08-28 DIAGNOSIS — K219 Gastro-esophageal reflux disease without esophagitis: Secondary | ICD-10-CM | POA: Diagnosis not present

## 2017-08-28 DIAGNOSIS — K263 Acute duodenal ulcer without hemorrhage or perforation: Secondary | ICD-10-CM | POA: Diagnosis not present

## 2017-08-28 DIAGNOSIS — Z86010 Personal history of colonic polyps: Secondary | ICD-10-CM | POA: Diagnosis not present

## 2017-08-28 DIAGNOSIS — K449 Diaphragmatic hernia without obstruction or gangrene: Secondary | ICD-10-CM | POA: Diagnosis not present

## 2017-08-29 ENCOUNTER — Encounter: Payer: Self-pay | Admitting: Physician Assistant

## 2017-08-29 DIAGNOSIS — K760 Fatty (change of) liver, not elsewhere classified: Secondary | ICD-10-CM | POA: Insufficient documentation

## 2017-08-29 LAB — HEPATIC FUNCTION PANEL
AG RATIO: 2 (calc) (ref 1.0–2.5)
ALT: 27 U/L (ref 6–29)
AST: 23 U/L (ref 10–35)
Albumin: 4.4 g/dL (ref 3.6–5.1)
Alkaline phosphatase (APISO): 82 U/L (ref 33–130)
Bilirubin, Direct: 0.1 mg/dL (ref 0.0–0.2)
GLOBULIN: 2.2 g/dL (ref 1.9–3.7)
Indirect Bilirubin: 0.2 mg/dL (calc) (ref 0.2–1.2)
TOTAL PROTEIN: 6.6 g/dL (ref 6.1–8.1)
Total Bilirubin: 0.3 mg/dL (ref 0.2–1.2)

## 2017-08-29 LAB — BASIC METABOLIC PANEL WITH GFR
BUN / CREAT RATIO: 18 (calc) (ref 6–22)
BUN: 21 mg/dL (ref 7–25)
CO2: 27 mmol/L (ref 20–32)
CREATININE: 1.14 mg/dL — AB (ref 0.50–0.99)
Calcium: 9.6 mg/dL (ref 8.6–10.4)
Chloride: 105 mmol/L (ref 98–110)
GFR, EST NON AFRICAN AMERICAN: 50 mL/min/{1.73_m2} — AB (ref >=60)
GFR, Est African American: 58 mL/min/{1.73_m2} — ABNORMAL LOW (ref >=60)
GLUCOSE: 80 mg/dL (ref 65–99)
Potassium: 5.1 mmol/L (ref 3.5–5.3)
Sodium: 140 mmol/L (ref 135–146)

## 2017-08-29 LAB — CBC WITH DIFFERENTIAL/PLATELET
BASOS PCT: 0.8 %
Basophils Absolute: 51 cells/uL (ref 0–200)
Eosinophils Absolute: 109 cells/uL (ref 15–500)
Eosinophils Relative: 1.7 %
HEMATOCRIT: 38.3 % (ref 35.0–45.0)
HEMOGLOBIN: 12.3 g/dL (ref 11.7–15.5)
LYMPHS ABS: 2067 {cells}/uL (ref 850–3900)
MCH: 27.5 pg (ref 27.0–33.0)
MCHC: 32.1 g/dL (ref 32.0–36.0)
MCV: 85.7 fL (ref 80.0–100.0)
MONOS PCT: 11 %
MPV: 10.8 fL (ref 7.5–12.5)
NEUTROS ABS: 3469 {cells}/uL (ref 1500–7800)
Neutrophils Relative %: 54.2 %
Platelets: 314 10*3/uL (ref 140–400)
RBC: 4.47 10*6/uL (ref 3.80–5.10)
RDW: 12.8 % (ref 11.0–15.0)
Total Lymphocyte: 32.3 %
WBC: 6.4 10*3/uL (ref 3.8–10.8)
WBCMIX: 704 {cells}/uL (ref 200–950)

## 2017-08-29 LAB — IRON, TOTAL/TOTAL IRON BINDING CAP
%SAT: 8 % — AB (ref 11–50)
Iron: 33 ug/dL — ABNORMAL LOW (ref 45–160)
TIBC: 413 ug/dL (ref 250–450)

## 2017-08-29 LAB — LIPID PANEL
CHOLESTEROL: 228 mg/dL — AB (ref <200)
HDL: 37 mg/dL — ABNORMAL LOW (ref >50)
Non-HDL Cholesterol (Calc): 191 mg/dL (calc) — ABNORMAL HIGH (ref <130)
Total CHOL/HDL Ratio: 6.2 (calc) — ABNORMAL HIGH (ref <5.0)
Triglycerides: 623 mg/dL — ABNORMAL HIGH (ref <150)

## 2017-08-29 LAB — SEDIMENTATION RATE: Sed Rate: 9 mm/h (ref 0–30)

## 2017-08-29 LAB — CK: Total CK: 77 U/L (ref 29–143)

## 2017-08-29 LAB — MAGNESIUM: MAGNESIUM: 1.9 mg/dL (ref 1.5–2.5)

## 2017-08-29 LAB — URIC ACID: URIC ACID, SERUM: 6.9 mg/dL (ref 2.5–7.0)

## 2017-08-29 LAB — TSH: TSH: 1.95 mIU/L (ref 0.40–4.50)

## 2017-08-29 LAB — METHYLMALONIC ACID, SERUM: METHYLMALONIC ACID, QUANT: 216 nmol/L (ref 87–318)

## 2017-08-31 ENCOUNTER — Other Ambulatory Visit: Payer: Managed Care, Other (non HMO)

## 2017-09-01 ENCOUNTER — Ambulatory Visit: Payer: Self-pay | Admitting: Physician Assistant

## 2017-09-04 ENCOUNTER — Telehealth: Payer: Self-pay

## 2017-09-04 NOTE — Telephone Encounter (Signed)
Pt reports new start says she is doing well on meds & no side effects. But still has some depression asking if she can increase either meds. (cymbalta & requip)  Per provider cymbalta takes about 4 weeks to kick in so let's wait the 4 wks & if need be we can increase it then.  Pt agreed, voiced understanding & hung up.

## 2017-09-23 ENCOUNTER — Ambulatory Visit: Payer: BLUE CROSS/BLUE SHIELD | Admitting: Physician Assistant

## 2017-09-23 VITALS — BP 114/72 | HR 88 | Temp 97.5°F | Resp 16 | Ht 67.0 in | Wt 175.6 lb

## 2017-09-23 DIAGNOSIS — F319 Bipolar disorder, unspecified: Secondary | ICD-10-CM

## 2017-09-23 DIAGNOSIS — R030 Elevated blood-pressure reading, without diagnosis of hypertension: Secondary | ICD-10-CM

## 2017-09-23 DIAGNOSIS — R1011 Right upper quadrant pain: Secondary | ICD-10-CM

## 2017-09-23 DIAGNOSIS — F3130 Bipolar disorder, current episode depressed, mild or moderate severity, unspecified: Secondary | ICD-10-CM

## 2017-09-23 DIAGNOSIS — E782 Mixed hyperlipidemia: Secondary | ICD-10-CM

## 2017-09-23 DIAGNOSIS — R112 Nausea with vomiting, unspecified: Secondary | ICD-10-CM | POA: Diagnosis not present

## 2017-09-23 DIAGNOSIS — R948 Abnormal results of function studies of other organs and systems: Secondary | ICD-10-CM

## 2017-09-23 DIAGNOSIS — D508 Other iron deficiency anemias: Secondary | ICD-10-CM

## 2017-09-23 MED ORDER — FENOFIBRATE 145 MG PO TABS: 145.0000 mg | ORAL_TABLET | Freq: Every day | ORAL | 3 refills | Status: DC

## 2017-09-23 MED ORDER — ROPINIROLE HCL 1 MG PO TABS: ORAL_TABLET | ORAL | 3 refills | Status: DC

## 2017-09-23 MED ORDER — DULOXETINE HCL 60 MG PO CPEP: 60.0000 mg | ORAL_CAPSULE | Freq: Every day | ORAL | 2 refills | Status: DC

## 2017-09-23 NOTE — Patient Instructions (Addendum)
Start on zantac 150mg  twice a day Will get HIDA and if this is abnormal we will send you to a surgeon Continue to do low fat diet tums is fine to take, max 10 a day   Look up voltern gel/diclofenac gel in [place]  Measuring out your water, goal is 80-100 oz a day  Low-Fat Diet for Pancreatitis or Gallbladder Conditions A low-fat diet can be helpful if you have pancreatitis or a gallbladder condition. With these conditions, your pancreas and gallbladder have trouble digesting fats. A healthy eating plan with less fat will help rest your pancreas and gallbladder and reduce your symptoms. What do I need to know about this diet?  Eat a low-fat diet. ? Reduce your fat intake to less than 20-30% of your total daily calories. This is less than 50-60 g of fat per day. ? Remember that you need some fat in your diet. Ask your dietician what your daily goal should be. ? Choose nonfat and low-fat healthy foods. Look for the words "nonfat," "low fat," or "fat free." ? As a guide, look on the label and choose foods with less than 3 g of fat per serving. Eat only one serving.  Avoid alcohol.  Do not smoke. If you need help quitting, talk with your health care provider.  Eat small frequent meals instead of three large heavy meals. What foods can I eat? Grains Include healthy grains and starches such as potatoes, wheat bread, fiber-rich cereal, and brown rice. Choose whole grain options whenever possible. In adults, whole grains should account for 45-65% of your daily calories. Fruits and Vegetables Eat plenty of fruits and vegetables. Fresh fruits and vegetables add fiber to your diet. Meats and Other Protein Sources Eat lean meat such as chicken and pork. Trim any fat off of meat before cooking it. Eggs, fish, and beans are other sources of protein. In adults, these foods should account for 10-35% of your daily calories. Dairy Choose low-fat milk and dairy options. Dairy includes fat and protein, as  well as calcium. Fats and Oils Limit high-fat foods such as fried foods, sweets, baked goods, sugary drinks. Other Creamy sauces and condiments, such as mayonnaise, can add extra fat. Think about whether or not you need to use them, or use smaller amounts or low fat options. What foods are not recommended?  High fat foods, such as: ? [employer]. ? Ice cream. ? [place] toast. ? Sweet rolls. ? Pizza. ? Cheese bread. ? Foods covered with batter, butter, creamy sauces, or cheese. ? Fried foods. ? Sugary drinks and desserts.  Foods that cause gas or bloating This information is not intended to replace advice given to you by your health care provider. Make sure you discuss any questions you have with your health care provider. Document Released: 08/09/2013 Document Revised: 01/10/2016 Document Reviewed: 07/18/2013 Elsevier Interactive Patient Education  2017 [employer].

## 2017-09-23 NOTE — Progress Notes (Addendum)
Subjective:    Patient ID: Kimberly Ray, female    DOB: Sep 23, 1950, 67 y.o.   MRN: [id number]  HPI 67 y.o. WF presents with possible reflux symptoms.  She complains of nausea x years, no vomiting, she is on carafate 3-4 times a day and is on zofran 4 mg 2-3 times a day, worse in the AM. States she can not go to work due to the nausea, epigastric pain, feels like a knot, no radiation. Feels it is worse with fatty foods.  Had an EGD 10/2015 with Dr. Watt Climes PER PATIENT HAD RECENT EGD AND COLONOSOCPY WITH DR. Ardine Eng , she had duodenal ulcer at that time. She has also had recent US 08/28/2017 that showed cholelithiasis without evidence of cholecystitis. She is off her PPI due to anemia.  Last colonoscopy 2013 She has RLS and is on requip 0.25mg .  She has depression and is on cymbalta 30 mg. States she is doing well with it, no side effects, would like to increase to 60 mg. She has anemia and decreased kidney function.    Blood pressure 114/72, pulse 88, temperature (!) 97.5 F (36.4 C), resp. rate 16, height 5\' 7"  (1.702 m), weight 175 lb 9.6 oz (79.7 kg).  Medications Current Outpatient Medications on File Prior to Visit  Medication Sig  . DULoxetine (CYMBALTA) 30 MG capsule Take 1 capsule (30 mg total) by mouth daily.  Marland Kitchen eletriptan (RELPAX) 20 MG tablet Take 1 tablet (20 mg total) by mouth as needed for migraine or headache. 1 po at onset of headache. May repeat in 2 hours if headache persists or recurs.  . fenofibrate (TRICOR) 145 MG tablet Take 1 tablet (145 mg total) by mouth daily.  . ondansetron (ZOFRAN) 4 MG tablet Take 1 to 2 tablets 2 to 3 x / day if needed for Nausea  . rOPINIRole (REQUIP) 0.25 MG tablet Take 1 tablet (0.25 mg total) by mouth at bedtime.  . sucralfate (CARAFATE) 1 g tablet Take 1 tablet (1 g total) by mouth 4 (four) times daily.   Current Facility-Administered Medications on File Prior to Visit  Medication  . ipratropium-albuterol (DUONEB) 0.5-2.5 (3)  MG/3ML nebulizer solution 3 mL    Problem list She has Mixed hyperlipidemia; ADD (attention deficit disorder); RLS (restless legs syndrome); OSA on CPAP; Bipolar depression ([place]); Elevated BP without diagnosis of hypertension; Vitamin D deficiency; GERD ; Extrinsic asthma; Medication management; BMI 29.92,  adult; Anemia; Intractable migraine without aura and with status migrainosus; Excessive daytime sleepiness; Snoring; Circadian rhythm sleep disorder; Periodic limb movement disorder (PLMD); and Fatty liver on their problem list.   Review of Systems  Constitutional: Positive for fatigue. Negative for fever.  HENT: Negative for postnasal drip.   Respiratory: Negative.   Cardiovascular: Negative.   Gastrointestinal: Positive for abdominal pain and nausea. Negative for abdominal distention, anal bleeding, blood in stool, constipation, diarrhea, rectal pain and vomiting.  Endocrine: Negative.   Genitourinary: Negative.   Musculoskeletal: Positive for arthralgias and myalgias.  Skin: Negative.  Negative for rash.  Neurological: Positive for headaches.  Psychiatric/Behavioral: Negative.        Objective:   Physical Exam  Constitutional: She is oriented to person, place, and time. She appears well-developed and well-nourished.  HENT:  Head: Normocephalic and atraumatic.  Right Ear: External ear normal.  Left Ear: External ear normal.  Mouth/Throat: Oropharynx is clear and moist.  Eyes: Conjunctivae and EOM are normal. Pupils are equal, round, and reactive to light.  Neck: Normal  range of motion. Neck supple. No thyromegaly present.  Cardiovascular: Normal rate, regular rhythm and normal heart sounds. Exam reveals no gallop and no friction rub.  No murmur heard. Pulmonary/Chest: Effort normal and breath sounds normal. No respiratory distress. She has no wheezes.  Abdominal: Soft. Bowel sounds are normal. She exhibits no distension and no mass. There is no tenderness. There is no rebound  and no guarding.  Musculoskeletal: Normal range of motion.  Lymphadenopathy:    She has no cervical adenopathy.  Neurological: She is alert and oriented to person, place, and time. She displays normal reflexes. No cranial nerve deficit. Coordination normal.  Skin: Skin is warm and dry.  Psychiatric: She has a normal mood and affect.          Assessment & Plan:  Kimberly Ray was seen today for gastroesophageal reflux.  Diagnoses and all orders for this visit:  Other iron deficiency anemia -     Iron,Total/Total Iron Binding Cap - increase requip  Bipolar depression (H[facility]) -     DULoxetine (CYMBALTA) 60 MG capsule; Take 1 capsule (60 mg total) by mouth daily. - doing well on it, increase  Mixed hyperlipidemia Start on tricor -     fenofibrate (TRICOR) 145 MG tablet; Take 1 tablet (145 mg total) by mouth daily. -     CBC with Differential/Platelet -     BASIC METABOLIC PANEL WITH GFR -     Hepatic function panel  Nausea and vomiting, intractability of vomiting not specified, unspecified vomiting type -     NM Hepato W/EjeCT Fract; Future -     Amylase - continue zofran and carafate.   Right upper quadrant pain Normal EGD recently per patient, will request records, can add on zantac, will get HIDA, + stones but no inflammation, if + will refer to surgeon or if pain continues will send to surgeon.  -     NM Hepato W/EjeCT Fract; Future -     Amylase  Elevated BP without diagnosis of hypertension -     Urinalysis, Routine w reflex microscopic -     Microalbumin / creatinine urine ratio   Addendum: + HIDA with the symptoms, will refer to surgeon.   Future Appointments  Date Time Provider [place]  10/15/2017  3:45 PM Vicie Mutters, PA-C [facility] None  01/21/2018  9:00 AM Vicie Mutters, PA-C [facility] None  01/27/2018  1:15 PM Dennie Bible, NP [facility] None

## 2017-09-24 LAB — HEPATIC FUNCTION PANEL
AG RATIO: 1.9 (calc) (ref 1.0–2.5)
ALBUMIN MSPROF: 4.7 g/dL (ref 3.6–5.1)
ALT: 21 U/L (ref 6–29)
AST: 22 U/L (ref 10–35)
Alkaline phosphatase (APISO): 80 U/L (ref 33–130)
BILIRUBIN DIRECT: 0.1 mg/dL (ref 0.0–0.2)
BILIRUBIN TOTAL: 0.6 mg/dL (ref 0.2–1.2)
GLOBULIN: 2.5 g/dL (ref 1.9–3.7)
Indirect Bilirubin: 0.5 mg/dL (calc) (ref 0.2–1.2)
Total Protein: 7.2 g/dL (ref 6.1–8.1)

## 2017-09-24 LAB — URINALYSIS, ROUTINE W REFLEX MICROSCOPIC
BILIRUBIN URINE: NEGATIVE
Glucose, UA: NEGATIVE
Hgb urine dipstick: NEGATIVE
Ketones, ur: NEGATIVE
Leukocytes, UA: NEGATIVE
Nitrite: NEGATIVE
PH: 5.5 (ref 5.0–8.0)
Protein, ur: NEGATIVE
SPECIFIC GRAVITY, URINE: 1.017 (ref 1.001–1.03)

## 2017-09-24 LAB — IRON, TOTAL/TOTAL IRON BINDING CAP
%SAT: 14 % (ref 11–50)
IRON: 61 ug/dL (ref 45–160)
TIBC: 432 mcg/dL (calc) (ref 250–450)

## 2017-09-24 LAB — BASIC METABOLIC PANEL WITH GFR
BUN: 22 mg/dL (ref 7–25)
CO2: 32 mmol/L (ref 20–32)
CREATININE: 0.89 mg/dL (ref 0.50–0.99)
Calcium: 10.4 mg/dL (ref 8.6–10.4)
Chloride: 102 mmol/L (ref 98–110)
GFR, EST AFRICAN AMERICAN: 78 mL/min/{1.73_m2} (ref >=60)
GFR, EST NON AFRICAN AMERICAN: 67 mL/min/{1.73_m2} (ref >=60)
Glucose, Bld: 108 mg/dL — ABNORMAL HIGH (ref 65–99)
POTASSIUM: 4.8 mmol/L (ref 3.5–5.3)
Sodium: 141 mmol/L (ref 135–146)

## 2017-09-24 LAB — CBC WITH DIFFERENTIAL/PLATELET
BASOS ABS: 60 {cells}/uL (ref 0–200)
Basophils Relative: 0.6 %
Eosinophils Absolute: 60 cells/uL (ref 15–500)
Eosinophils Relative: 0.6 %
HEMATOCRIT: 41.4 % (ref 35.0–45.0)
HEMOGLOBIN: 13.5 g/dL (ref 11.7–15.5)
LYMPHS ABS: 1500 {cells}/uL (ref 850–3900)
MCH: 27.4 pg (ref 27.0–33.0)
MCHC: 32.6 g/dL (ref 32.0–36.0)
MCV: 84 fL (ref 80.0–100.0)
MPV: 10.4 fL (ref 7.5–12.5)
Monocytes Relative: 7 %
NEUTROS ABS: 7680 {cells}/uL (ref 1500–7800)
NEUTROS PCT: 76.8 %
Platelets: 318 10*3/uL (ref 140–400)
RBC: 4.93 10*6/uL (ref 3.80–5.10)
RDW: 12.5 % (ref 11.0–15.0)
Total Lymphocyte: 15 %
WBC: 10 10*3/uL (ref 3.8–10.8)
WBCMIX: 700 {cells}/uL (ref 200–950)

## 2017-09-24 LAB — MICROALBUMIN / CREATININE URINE RATIO
Creatinine, Urine: 94 mg/dL (ref 20–275)
MICROALB/CREAT RATIO: 9 ug/mg{creat} (ref <30)
Microalb, Ur: 0.8 mg/dL

## 2017-09-24 LAB — AMYLASE: AMYLASE: 110 U/L — AB (ref 21–101)

## 2017-10-12 ENCOUNTER — Ambulatory Visit (HOSPITAL_COMMUNITY)
Admission: RE | Admit: 2017-10-12 | Discharge: 2017-10-12 | Disposition: A | Discharge status: Home / Self Care | Payer: BLUE CROSS/BLUE SHIELD | Source: Ambulatory Visit | Attending: Physician Assistant | Admitting: Physician Assistant

## 2017-10-12 DIAGNOSIS — R1011 Right upper quadrant pain: Secondary | ICD-10-CM | POA: Diagnosis present

## 2017-10-12 DIAGNOSIS — R112 Nausea with vomiting, unspecified: Secondary | ICD-10-CM | POA: Insufficient documentation

## 2017-10-12 MED ORDER — TECHNETIUM TC 99M MEBROFENIN IV KIT
5.0000 | PACK | Freq: Once | INTRAVENOUS | Status: AC | PRN
  Administered 2017-10-12: 5 via INTRAVENOUS

## 2017-10-13 NOTE — Addendum Note (Signed)
Addended by: Vicie Mutters R on: 10/13/2017 07:08 AM   Modules accepted: Orders

## 2017-10-14 ENCOUNTER — Other Ambulatory Visit: Payer: Self-pay | Admitting: Physician Assistant

## 2017-10-14 DIAGNOSIS — F319 Bipolar disorder, unspecified: Secondary | ICD-10-CM

## 2017-10-14 MED ORDER — DULOXETINE HCL 60 MG PO CPEP: 60.0000 mg | ORAL_CAPSULE | Freq: Every day | ORAL | 1 refills | Status: DC

## 2017-10-15 ENCOUNTER — Other Ambulatory Visit: Payer: Self-pay | Admitting: Physician Assistant

## 2017-10-15 ENCOUNTER — Ambulatory Visit: Payer: Self-pay | Admitting: Physician Assistant

## 2017-10-20 DIAGNOSIS — R350 Frequency of micturition: Secondary | ICD-10-CM | POA: Diagnosis not present

## 2017-10-20 DIAGNOSIS — R102 Pelvic and perineal pain: Secondary | ICD-10-CM | POA: Diagnosis not present

## 2017-10-20 DIAGNOSIS — N950 Postmenopausal bleeding: Secondary | ICD-10-CM | POA: Diagnosis not present

## 2017-10-20 DIAGNOSIS — Z124 Encounter for screening for malignant neoplasm of cervix: Secondary | ICD-10-CM | POA: Diagnosis not present

## 2017-10-21 LAB — HM PAP SMEAR: HM PAP: NORMAL

## 2017-10-22 ENCOUNTER — Ambulatory Visit: Payer: Self-pay | Admitting: General Surgery

## 2017-10-22 NOTE — H&P (Signed)
History of Present Illness Ralene Ok MD; 10/22/2017 9:42 AM) The patient is a 67 year old female who presents for evaluation of gall stones. Referred by: Dr. Melford Aase Chief Complaint: Abdominal pain  Patient is a 67 year old female who comes in with a complaint of epigastric abdominal pain. Patient states that she feels that she's had this for approximately 2 years on and off. She states she has had some reflux persisted with the gastric abdominal pain. She states that she does notice more pain when she has spicy foods. Patient recently underwent upper endoscopy which revealed no signs of ulcers or masses. Patient has been on Carafate. She states this is help with her reflux would not submerse the abdominal pain. She does state that she has associated nausea and vomiting.  Patient had no previous abdominal surgeries   US shows large GS and HIDA with EF 13%    Allergies (Tanisha A. Owens Shark, RMA; 10/22/2017 9:26 AM) Erythromycin *DERMATOLOGICALS*  Penicillin G Sodium *PENICILLINS*  Clarithromycin *CHEMICALS*  Brintellix *ANTIDEPRESSANTS*  Macrodantin *URINARY ANTI-INFECTIVES*  Wellbutrin *ANTIDEPRESSANTS*  Allergies Reconciled   Medication History (Tanisha A. Owens Shark, RMA; 10/22/2017 9:27 AM) Ondansetron HCl (4MG  Tablet, Oral) Active. DULoxetine HCl (60MG  Capsule DR Part, Oral) Active. ROPINIRole HCl (1MG  Tablet, Oral) Active. Fenofibrate (145MG  Tablet, Oral) Active. Medications Reconciled    Review of Systems Ralene Ok, MD; 10/22/2017 9:43 AM) General Present- Feeling well. Not Present- Fever. Respiratory Not Present- Cough and Difficulty Breathing. Cardiovascular Not Present- Chest Pain. Gastrointestinal Present- Abdominal Pain and Nausea. Musculoskeletal Not Present- Myalgia. Neurological Not Present- Weakness. All other systems negative  Vitals (Tanisha A. Brown RMA; 10/22/2017 9:26 AM) 10/22/2017 9:26 AM Weight: 180.5 lb Height: 67in Body Surface  Area: 1.94 m Body Mass Index: 28.27 kg/m  Temp.: 97.31F  Pulse: 88 (Regular)  BP: 122/76 (Sitting, Left Arm, Standard)       Physical Exam Ralene Ok MD; 10/22/2017 9:42 AM) The physical exam findings are as follows: Note:Constitutional: No acute distress, conversant, appears stated age  Eyes: Anicteric sclerae, moist conjunctiva, no lid lag  Neck: No thyromegaly, trachea midline, no cervical lymphadenopathy  Lungs: Clear to auscultation biilaterally, normal respiratory effot  Cardiovascular: regular rate & rhythm, no murmurs, no peripheal edema, pedal pulses 2+  GI: Soft, no masses or hepatosplenomegaly, non-tender to palpation  MSK: Normal gait, no clubbing cyanosis, edema  Skin: No rashes, palpation reveals normal skin turgor  Psychiatric: Appropriate judgment and insight, oriented to person, place, and time    Assessment & Plan Ralene Ok MD; 10/22/2017 9:43 AM) SYMPTOMATIC CHOLELITHIASIS (K80.20) Impression: 67 year old female with symptomatic cholelithiasis 1. We will proceed to the operating room for a laparoscopic cholecystectomy  2. Risks and benefits were discussed with the patient to generally include, but not limited to: infection, bleeding, possible need for post op ERCP, damage to the bile ducts, bile leak, and possible need for further surgery. Alternatives were offered and described. All questions were answered and the patient voiced understanding of the procedure and wishes to proceed at this point with a laparoscopic cholecystectomy Current Plans You are being scheduled for surgery- Our schedulers will call you.  You should hear from our office's scheduling department within 5 working days about the location, date, and time of surgery. We try to make accommodations for patient's preferences in scheduling surgery, but sometimes the OR schedule or the surgeon's schedule prevents [name] from making those accommodations.  If you have not  heard from our office [phone]) in 5 working days, call the office  and ask for your surgeon's nurse.  If you have other questions about your diagnosis, plan, or surgery, call the office and ask for your surgeon's nurse.  Pt Education - Pamphlet Given - Laparoscopic Gallbladder Surgery: discussed with patient and provided information. Pt Education - Laparoscopic Cholecystectomy: gallbladder

## 2017-10-27 DIAGNOSIS — N83201 Unspecified ovarian cyst, right side: Secondary | ICD-10-CM | POA: Diagnosis not present

## 2017-10-27 DIAGNOSIS — N950 Postmenopausal bleeding: Secondary | ICD-10-CM | POA: Diagnosis not present

## 2017-11-02 DIAGNOSIS — N950 Postmenopausal bleeding: Secondary | ICD-10-CM | POA: Diagnosis not present

## 2017-11-02 DIAGNOSIS — N83209 Unspecified ovarian cyst, unspecified side: Secondary | ICD-10-CM | POA: Diagnosis not present

## 2017-11-03 ENCOUNTER — Other Ambulatory Visit: Payer: Self-pay | Admitting: General Surgery

## 2017-11-03 DIAGNOSIS — K8010 Calculus of gallbladder with chronic cholecystitis without obstruction: Secondary | ICD-10-CM | POA: Diagnosis not present

## 2017-11-03 DIAGNOSIS — K824 Cholesterolosis of gallbladder: Secondary | ICD-10-CM | POA: Diagnosis not present

## 2017-11-03 HISTORY — PX: CHOLECYSTECTOMY: SHX55

## 2017-11-10 ENCOUNTER — Encounter: Payer: Self-pay | Admitting: Physician Assistant

## 2017-11-10 ENCOUNTER — Ambulatory Visit: Payer: BLUE CROSS/BLUE SHIELD | Admitting: Physician Assistant

## 2017-11-10 VITALS — BP 112/78 | HR 102 | Temp 97.4°F | Resp 16 | Ht 67.0 in | Wt 173.4 lb

## 2017-11-10 DIAGNOSIS — F319 Bipolar disorder, unspecified: Secondary | ICD-10-CM

## 2017-11-10 DIAGNOSIS — F3130 Bipolar disorder, current episode depressed, mild or moderate severity, unspecified: Secondary | ICD-10-CM | POA: Diagnosis not present

## 2017-11-10 DIAGNOSIS — R030 Elevated blood-pressure reading, without diagnosis of hypertension: Secondary | ICD-10-CM

## 2017-11-10 DIAGNOSIS — R102 Pelvic and perineal pain: Secondary | ICD-10-CM | POA: Diagnosis not present

## 2017-11-10 MED ORDER — LAMOTRIGINE 100 MG PO TABS: 100.0000 mg | ORAL_TABLET | Freq: Every day | ORAL | 2 refills | Status: DC

## 2017-11-10 MED ORDER — DULOXETINE HCL 30 MG PO CPEP: ORAL_CAPSULE | ORAL | 1 refills | Status: DC

## 2017-11-10 NOTE — Patient Instructions (Addendum)
Lamictal start on 1/2 pill at night for 14 days Then go to 1 pill at night   Requip two ways to take it Can do 1 mg at supper and 1 mg 1 hour before bed  Or can do 2 mg at least 1 hour before bed  Will increase cymbalta to 90mg - this is 30 mg x 3 pills See if this helps  Can do miralax once a day until BM  Do liquid diet for a few days with jello/brothes then go to bowel rest Bowel rest means no wheat/fiber, so please eat white stuff like potatoes, soup, crackers until you are feeling better and then slowly advance your diet back to veggies and fiber like wheat.   IF NOT BETTER, will call in cipro/flaygl for diverticulitis  Diverticulitis Diverticulitis is inflammation or infection of small pouches in your colon that form when you have a condition called diverticulosis. The pouches in your colon are called diverticula. Your colon, or large intestine, is where water is absorbed and stool is formed. Complications of diverticulitis can include:  Bleeding.  Severe infection.  Severe pain.  Perforation of your colon.  Obstruction of your colon.  What are the causes? Diverticulitis is caused by bacteria. Diverticulitis happens when stool becomes trapped in diverticula. This allows bacteria to grow in the diverticula, which can lead to inflammation and infection. What increases the risk? People with diverticulosis are at risk for diverticulitis. Eating a diet that does not include enough fiber from fruits and vegetables may make diverticulitis more likely to develop. What are the signs or symptoms? Symptoms of diverticulitis may include:  Abdominal pain and tenderness. The pain is normally located on the left side of the abdomen, but may occur in other areas.  Fever and chills.  Bloating.  Cramping.  Nausea.  Vomiting.  Constipation.  Diarrhea.  Blood in your stool.  How is this diagnosed? Your health care provider will ask you about your medical history and do a  physical exam. You may need to have tests done because many medical conditions can cause the same symptoms as diverticulitis. Tests may include:  Blood tests.  Urine tests.  Imaging tests of the abdomen, including X-rays and CT scans.  When your condition is under control, your health care provider may recommend that you have a colonoscopy. A colonoscopy can show how severe your diverticula are and whether something else is causing your symptoms. How is this treated? Most cases of diverticulitis are mild and can be treated at home. Treatment may include:  Taking over-the-counter pain medicines.  Following a clear liquid diet.  Taking antibiotic medicines by mouth for 7-10 days.  More severe cases may be treated at a hospital. Treatment may include:  Not eating or drinking.  Taking prescription pain medicine.  Receiving antibiotic medicines through an IV tube.  Receiving fluids and nutrition through an IV tube.  Surgery.  Follow these instructions at home:  Follow your health care provider's instructions carefully.  Follow a full liquid diet or other diet as directed by your health care provider. After your symptoms improve, your health care provider may tell you to change your diet. He or she may recommend you eat a high-fiber diet. Fruits and vegetables are good sources of fiber. Fiber makes it easier to pass stool.  Take fiber supplements or probiotics as directed by your health care provider.  Only take medicines as directed by your health care provider.  Keep all your follow-up appointments. Contact a health  care provider if:  Your pain does not improve.  You have a hard time eating food.  Your bowel movements do not return to normal. Get help right away if:  Your pain becomes worse.  Your symptoms do not get better.  Your symptoms suddenly get worse.  You have a fever.  You have repeated vomiting.  You have bloody or black, tarry stools. This  information is not intended to replace advice given to you by your health care provider. Make sure you discuss any questions you have with your health care provider. Document Released: 05/14/2005 Document Revised: 01/10/2016 Document Reviewed: 06/29/2013 Elsevier Interactive Patient Education  2017 [employer].

## 2017-11-10 NOTE — Progress Notes (Signed)
FOLLOW UP  Assessment and Plan:  Kimberly Ray was seen today for acute visit and depression.  Diagnoses and all orders for this visit:  Bipolar depression ([place]) -     lamoTRIgine (LAMICTAL) 100 MG tablet; Take 1 tablet (100 mg total) by mouth daily- start slowly and taper after 2-4 weeks -     DULoxetine (CYMBALTA) 30 MG capsule; Take a 60 mg and 30 mg of cymbalta a day for a total of 90mg  a day - no SI/HI, go to ER if any suicidal ideations - suggest counseling - very close follow up 4-6 weeks  Elevated BP without diagnosis of hypertension - continue medications, DASH diet, exercise and monitor at home. Call if greater than 130/80.  -     CBC with Differential/Platelet -     BASIC METABOLIC PANEL WITH GFR -     Hepatic function panel  Suprapubic abdominal pain Will get notes from GYN Patient has follow up next week ? Some constipation, do miralax, liquid diet, if any worsening pain call the office and or go to the ER -     Urinalysis, Routine w reflex microscopic -     Urine Culture    Continue diet and meds as discussed. Further disposition pending results of labs. Over 60 minutes of exam, counseling, chart review, and critical decision making was performed  Future Appointments  Date Time Provider [place]  01/21/2018  9:00 AM Vicie Mutters, PA-C [facility] None  01/27/2018  1:15 PM Dennie Bible, NP [facility] None     HPI 67 y.o. female  presents for 3 month follow up on hypertension, cholesterol, prediabetes, and vitamin D deficiency.   She has a history of depression, stopped all of her medications on her own without supervision, recently restarted on cymbalta 60 mg and requip 1mg . Since surgery she has been sleeping 10-12 hours a day.   She was having nausea and upper GI pain, had abnormal HIDA, had recent gallbladder surgery 11/03/2017 with Dr. Rosendo Gros. She states the stomach "knot" she feels is better, she has been having less nausea but decreased GERD.  She has been following with Dr. Theodoro Clock for ovarian cyst, we do not have any notes, will request. She is having bilateral lower AB pain, she is very anxious and has been searching google a lot about ovarian cyst.   Her blood pressure has been controlled at home, today their BP is BP: 112/78   She does not workout. She denies chest pain, shortness of breath, dizziness.  Last A1C in the office was:  Lab Results  Component Value Date   HGBA1C 5.3 06/12/2016   Patient is on Vitamin D supplement.   Lab Results  Component Value Date   VD25OH 35 05/13/2017        Current Medications:  Current Outpatient Medications on File Prior to Visit  Medication Sig  . DULoxetine (CYMBALTA) 60 MG capsule Take 1 capsule (60 mg total) by mouth daily.  Marland Kitchen eletriptan (RELPAX) 20 MG tablet Take 1 tablet (20 mg total) by mouth as needed for migraine or headache. 1 po at onset of headache. May repeat in 2 hours if headache persists or recurs.  . fenofibrate (TRICOR) 145 MG tablet Take 1 tablet (145 mg total) by mouth daily.  . ondansetron (ZOFRAN) 4 MG tablet Take 1 to 2 tablets 2 to 3 x / day if needed for Nausea  . rOPINIRole (REQUIP) 1 MG tablet 1/2- 2 pills at night for restless legs as  needed  . sucralfate (CARAFATE) 1 g tablet Take 1 tablet (1 g total) by mouth 4 (four) times daily.   Current Facility-Administered Medications on File Prior to Visit  Medication  . ipratropium-albuterol (DUONEB) 0.5-2.5 (3) MG/3ML nebulizer solution 3 mL    Medical History:  Past Medical History:  Diagnosis Date  . Anemia   . Depression   . GERD (gastroesophageal reflux disease)   . Hay fever   . Head trauma   . IBS (irritable bowel syndrome)   . Memory loss   . Migraines   . OSA on CPAP   . Reflux   . RLS (restless legs syndrome)   . Sinus congestion    Allergies:  Allergies  Allergen Reactions  . Erythromycin Other (See Comments)    TBS  . Ivp Dye [Iodinated Diagnostic Agents] Other (See  Comments)    Hot flashes, itching scalp  . Penicillins Hives  . Clarithromycin Other (See Comments)    Itching ankles  . Brintellix [Vortioxetine]     Itching  . Macrodantin [Nitrofurantoin]      Review of Systems:  ROS See HPI  Family history- Review and unchanged Social history- Review and unchanged Physical Exam: BP 112/78   Pulse (!) 102   Temp (!) 97.4 F (36.3 C)   Resp 16   Ht 5\' 7"  (1.702 m)   Wt 173 lb 6.4 oz (78.7 kg)   SpO2 96%   BMI 27.16 kg/m  Wt Readings from Last 3 Encounters:  11/10/17 173 lb 6.4 oz (78.7 kg)  09/23/17 175 lb 9.6 oz (79.7 kg)  08/26/17 177 lb (80.3 kg)   General Appearance: Well nourished, in no apparent distress. Eyes: PERRLA, EOMs, conjunctiva no swelling or erythema Sinuses: No Frontal/maxillary tenderness ENT/Mouth: Ext aud canals clear, TMs without erythema, bulging. No erythema, swelling, or exudate on post pharynx.  Tonsils not swollen or erythematous. Hearing normal.  Neck: Supple, thyroid normal.  Respiratory: Respiratory effort normal, BS equal bilaterally without rales, rhonchi, wheezing or stridor.  Cardio: RRR with no MRGs. Brisk peripheral pulses without edema.  Abdomen: Soft, + BS, well healing lap scars, mild right and left lower quadrant tenderness, no guarding, rebound, hernias, masses. Lymphatics: Non tender without lymphadenopathy.  Musculoskeletal: Full ROM, 5/5 strength, Normal gait Skin: Warm, dry without rashes, lesions, ecchymosis.  Neuro: Cranial nerves intact. Normal muscle tone, no cerebellar symptoms. Psych: Awake and oriented X 3, normal affect, Insight and Judgment appropriate.    Vicie Mutters, PA-C 11:30 AM [facility]

## 2017-11-11 ENCOUNTER — Encounter: Payer: Self-pay | Admitting: Physician Assistant

## 2017-11-11 LAB — CBC WITH DIFFERENTIAL/PLATELET
BASOS ABS: 47 {cells}/uL (ref 0–200)
BASOS PCT: 0.4 %
EOS PCT: 0.4 %
Eosinophils Absolute: 47 cells/uL (ref 15–500)
HEMATOCRIT: 39.9 % (ref 35.0–45.0)
HEMOGLOBIN: 13.2 g/dL (ref 11.7–15.5)
Lymphs Abs: 1837 cells/uL (ref 850–3900)
MCH: 27.4 pg (ref 27.0–33.0)
MCHC: 33.1 g/dL (ref 32.0–36.0)
MCV: 83 fL (ref 80.0–100.0)
MONOS PCT: 8.9 %
MPV: 9.5 fL (ref 7.5–12.5)
NEUTROS ABS: 8728 {cells}/uL — AB (ref 1500–7800)
Neutrophils Relative %: 74.6 %
Platelets: 426 10*3/uL — ABNORMAL HIGH (ref 140–400)
RBC: 4.81 10*6/uL (ref 3.80–5.10)
RDW: 12.4 % (ref 11.0–15.0)
Total Lymphocyte: 15.7 %
WBC mixed population: 1041 cells/uL — ABNORMAL HIGH (ref 200–950)
WBC: 11.7 10*3/uL — AB (ref 3.8–10.8)

## 2017-11-11 LAB — BASIC METABOLIC PANEL WITH GFR
BUN/Creatinine Ratio: 20 (calc) (ref 6–22)
BUN: 21 mg/dL (ref 7–25)
CALCIUM: 9.9 mg/dL (ref 8.6–10.4)
CO2: 27 mmol/L (ref 20–32)
CREATININE: 1.03 mg/dL — AB (ref 0.50–0.99)
Chloride: 100 mmol/L (ref 98–110)
GFR, EST NON AFRICAN AMERICAN: 56 mL/min/{1.73_m2} — AB (ref >=60)
GFR, Est African American: 65 mL/min/{1.73_m2} (ref >=60)
GLUCOSE: 93 mg/dL (ref 65–99)
Potassium: 4 mmol/L (ref 3.5–5.3)
SODIUM: 136 mmol/L (ref 135–146)

## 2017-11-11 LAB — HEPATIC FUNCTION PANEL
AG Ratio: 1.6 (calc) (ref 1.0–2.5)
ALBUMIN MSPROF: 4.3 g/dL (ref 3.6–5.1)
ALT: 14 U/L (ref 6–29)
AST: 15 U/L (ref 10–35)
Alkaline phosphatase (APISO): 69 U/L (ref 33–130)
Bilirubin, Direct: 0.1 mg/dL (ref 0.0–0.2)
GLOBULIN: 2.7 g/dL (ref 1.9–3.7)
Indirect Bilirubin: 0.4 mg/dL (calc) (ref 0.2–1.2)
TOTAL PROTEIN: 7 g/dL (ref 6.1–8.1)
Total Bilirubin: 0.5 mg/dL (ref 0.2–1.2)

## 2017-11-11 LAB — URINALYSIS, ROUTINE W REFLEX MICROSCOPIC
BILIRUBIN URINE: NEGATIVE
Bacteria, UA: NONE SEEN /HPF
GLUCOSE, UA: NEGATIVE
Hgb urine dipstick: NEGATIVE
Hyaline Cast: NONE SEEN /LPF
KETONES UR: NEGATIVE
NITRITE: NEGATIVE
PH: 5.5 (ref 5.0–8.0)
Protein, ur: NEGATIVE
SPECIFIC GRAVITY, URINE: 1.023 (ref 1.001–1.03)
Squamous Epithelial / LPF: NONE SEEN /HPF (ref <=5)

## 2017-11-11 LAB — URINE CULTURE
MICRO NUMBER:: 90378320
SPECIMEN QUALITY:: ADEQUATE

## 2017-11-13 ENCOUNTER — Other Ambulatory Visit: Payer: Self-pay | Admitting: Physician Assistant

## 2017-11-13 MED ORDER — LAMOTRIGINE 50 MG PO TBDP: ORAL_TABLET | ORAL | 1 refills | Status: DC

## 2017-11-16 DIAGNOSIS — N83209 Unspecified ovarian cyst, unspecified side: Secondary | ICD-10-CM | POA: Diagnosis not present

## 2017-11-19 ENCOUNTER — Encounter: Payer: Self-pay | Admitting: Physician Assistant

## 2017-11-19 ENCOUNTER — Telehealth: Payer: Self-pay | Admitting: *Deleted

## 2017-11-19 ENCOUNTER — Other Ambulatory Visit: Payer: Self-pay | Admitting: Physician Assistant

## 2017-11-19 DIAGNOSIS — N63 Unspecified lump in unspecified breast: Secondary | ICD-10-CM

## 2017-11-19 NOTE — Telephone Encounter (Signed)
Called patient and scheduled an appt for April 11th at 11am

## 2017-11-21 ENCOUNTER — Other Ambulatory Visit: Payer: Self-pay | Admitting: Physician Assistant

## 2017-11-21 DIAGNOSIS — Z1231 Encounter for screening mammogram for malignant neoplasm of breast: Secondary | ICD-10-CM

## 2017-11-26 ENCOUNTER — Inpatient Hospital Stay: Payer: BLUE CROSS/BLUE SHIELD | Attending: Gynecologic Oncology | Admitting: Gynecologic Oncology

## 2017-11-26 ENCOUNTER — Encounter: Payer: Self-pay | Admitting: Physician Assistant

## 2017-11-26 ENCOUNTER — Ambulatory Visit (INDEPENDENT_AMBULATORY_CARE_PROVIDER_SITE_OTHER): Payer: BLUE CROSS/BLUE SHIELD | Admitting: Physician Assistant

## 2017-11-26 ENCOUNTER — Encounter: Payer: Self-pay | Admitting: Gynecologic Oncology

## 2017-11-26 VITALS — BP 133/63 | HR 94 | Temp 98.1°F | Resp 20 | Wt 177.0 lb

## 2017-11-26 VITALS — BP 124/72 | HR 86 | Temp 98.4°F | Resp 14 | Ht 67.0 in | Wt 177.0 lb

## 2017-11-26 DIAGNOSIS — E782 Mixed hyperlipidemia: Secondary | ICD-10-CM

## 2017-11-26 DIAGNOSIS — R21 Rash and other nonspecific skin eruption: Secondary | ICD-10-CM

## 2017-11-26 DIAGNOSIS — Z803 Family history of malignant neoplasm of breast: Secondary | ICD-10-CM | POA: Insufficient documentation

## 2017-11-26 DIAGNOSIS — F319 Bipolar disorder, unspecified: Secondary | ICD-10-CM

## 2017-11-26 DIAGNOSIS — N83209 Unspecified ovarian cyst, unspecified side: Secondary | ICD-10-CM

## 2017-11-26 MED ORDER — DULOXETINE HCL 60 MG PO CPEP: 60.0000 mg | ORAL_CAPSULE | Freq: Two times a day (BID) | ORAL | 2 refills | Status: DC

## 2017-11-26 MED ORDER — TRIAMCINOLONE ACETONIDE 0.5 % EX CREA: 1.0000 "application " | TOPICAL_CREAM | Freq: Two times a day (BID) | CUTANEOUS | 2 refills | Status: DC

## 2017-11-26 MED ORDER — FENOFIBRATE 145 MG PO TABS: 145.0000 mg | ORAL_TABLET | Freq: Every day | ORAL | 3 refills | Status: DC

## 2017-11-26 MED ORDER — NYSTATIN 100000 UNIT/GM EX CREA: 1.0000 "application " | TOPICAL_CREAM | Freq: Two times a day (BID) | CUTANEOUS | 1 refills | Status: DC

## 2017-11-26 NOTE — Patient Instructions (Addendum)
Plan on having surgery at [facility] with Dr. Skeet Latch.  You will receive a phone call from [place] at [facility].

## 2017-11-26 NOTE — Progress Notes (Signed)
Subjective:    Patient ID: Kimberly Ray, female    DOB: 1951/01/21, 67 y.o.   MRN: [id number]  HPI 67 y.o. WF with history of bipolar depression. She had recent gallbladder surgery and states she is doing better with this, she was started back on lamictal for depression and states she is doing much better.  She has been having itching and has new rash on back of her right leg. Did just get her legs waxed a week ago. No rash anywhere else, no rash on mucosa, no fever, chills.   Blood pressure 124/72, pulse 86, temperature 98.4 F (36.9 C), resp. rate 14, height 5\' 7"  (1.702 m), weight 177 lb (80.3 kg), SpO2 96 %.  Medications Current Outpatient Medications on File Prior to Visit  Medication Sig  . DULoxetine (CYMBALTA) 30 MG capsule Take a 60 mg and 30 mg of cymbalta a day for a total of 90mg  a day  . eletriptan (RELPAX) 20 MG tablet Take 1 tablet (20 mg total) by mouth as needed for migraine or headache. 1 po at onset of headache. May repeat in 2 hours if headache persists or recurs.  . fenofibrate (TRICOR) 145 MG tablet Take 1 tablet (145 mg total) by mouth daily.  Marland Kitchen lamoTRIgine 50 MG TBDP 1/2 tablet at night for 5 nights, then go up to 1 tablet at night for 2-4 weeks.  . ondansetron (ZOFRAN) 4 MG tablet Take 1 to 2 tablets 2 to 3 x / day if needed for Nausea  . rOPINIRole (REQUIP) 1 MG tablet 1/2- 2 pills at night for restless legs as needed  . sucralfate (CARAFATE) 1 g tablet Take 1 tablet (1 g total) by mouth 4 (four) times daily.   Current Facility-Administered Medications on File Prior to Visit  Medication  . ipratropium-albuterol (DUONEB) 0.5-2.5 (3) MG/3ML nebulizer solution 3 mL    Problem list She has Mixed hyperlipidemia; ADD (attention deficit disorder); RLS (restless legs syndrome); OSA on CPAP; Bipolar depression ([place]); Elevated BP without diagnosis of hypertension; Vitamin D deficiency; GERD ; Extrinsic asthma; Medication management; BMI 29.92,  adult; Anemia;  Intractable migraine without aura and with status migrainosus; Excessive daytime sleepiness; Snoring; Circadian rhythm sleep disorder; Periodic limb movement disorder (PLMD); and Fatty liver on their problem list.   Review of Systems  Constitutional: Negative.  Negative for chills, fatigue and fever.  HENT: Negative.   Respiratory: Negative.   Cardiovascular: Negative.   Gastrointestinal: Negative.  Negative for diarrhea.  Genitourinary: Negative.   Musculoskeletal: Negative.  Negative for arthralgias.  Skin: Positive for rash. Negative for color change and wound.  Neurological: Negative.  Negative for dizziness.       Objective:   Physical Exam  Constitutional: She is oriented to person, place, and time. She appears well-developed and well-nourished.  HENT:  Head: Normocephalic and atraumatic.  Nose: Nose normal.  Mouth/Throat: No oropharyngeal exudate.  Neck: Normal range of motion. Neck supple.  Musculoskeletal: Normal range of motion.  Neurological: She is alert and oriented to person, place, and time.  Skin: Skin is warm and dry. Rash (2 cm dry erytheamtous scaly macule on right posterior leg, no other rashes anywhere else) noted.  Psychiatric: She has a normal mood and affect. Her behavior is normal. Judgment and thought content normal.          Assessment & Plan:    Rash Understands if any other rashes to stop lamictal but appears to be more eczema versus fungal- will treat with  cream -     nystatin cream (MYCOSTATIN); Apply 1 application topically 2 (two) times daily. -     triamcinolone cream (KENALOG) 0.5 %; Apply 1 application topically 2 (two) times daily.  Mixed hyperlipidemia -     fenofibrate (TRICOR) 145 MG tablet; Take 1 tablet (145 mg total) by mouth daily.  Bipolar depression ([place]) Continue lamictal for now but do 50mg  BID- has helped with depression Discussed steven's johnson and rare rash that can come from lamictal- patient has return precautions  and understands.   Can not find her 60mg  cymbalta so will send in for twice a day but she is only taking once a day with a 30 mg to make 90 mg -     DULoxetine (CYMBALTA) 60 MG capsule; Take 1 capsule (60 mg total) by mouth 2 (two) times daily.  Future Appointments  Date Time Provider [place]  11/26/2017 11:00 AM Janie Morning, MD [facility] None  12/15/2017  7:30 AM [facility] MM 3 [facility]  12/15/2017 11:15 AM Vicie Mutters, PA-C [facility] None  01/27/2018  1:15 PM Dennie Bible, NP [facility] None  03/16/2018  9:00 AM Liane Comber, NP [facility] None

## 2017-11-26 NOTE — Progress Notes (Signed)
Consult Note: Gyn-Onc  Consult was requested by Dr. Royston Sinner  for the evaluation of ENYLA LISBON 67 y.o. female  CC:  Chief Complaint  Patient presents with  . Cyst of ovary, unspecified laterality    Assessment/Plan:  Ms. KEAISHA SUBLETTE is a 67 y.o. with self-limiting uterine bleeding, an endometrial stripe of 4 mm, an ultrasound that shows a 10 mm irregular cyst and laparoscopic findings notable for an adnexa with excrescences.  CA125 is 6 and she is asymptomatic save for the persistent bloating which has not resolved since her cholecystectomy.  We considered repeat ultrasound however that seems to be of limited utility given the fact that the ultrasound not identified the presence of the excrescences noted on laparoscopic imaging.  It is possible that the excrescences represent [place] cysts but this cannot be confirmed without repeat laparoscopy.  The endometrial stripe is 4 mm however that does not eliminate the possibility of neoplasia and sampling would require sedation.    Options presented were repeat laparoscopy  without any additional action if the adnexa appear within normal limits.  At that time a D&C can be attempted to definitively evaluate the postmenopausal bleeding and the mucoid material noted on the diagnostic limited attempt.  A second option which will likely be negative for evidence of invasive malignancy is  bilateral salpingo-oophorectomy with D&C hysteroscopy, or third a minimally invasive hysterectomy bilateral salpingo-oophorectomy.  The third option will be the most definitive however is the most invasive.  Marcille Buffy opts for the third procedure.  We had a long discussion regarding the possibility of complications and the fact that this may be very low yield.  She wishes to proceed as soon as possible and to accommodate that procedure will be scheduled at [facility] in May 2019   HPI: Ms. TARYNE KIGER is a 67 y.o.   who initially presented with complaints of  postmenopausal bleeding.  A transvaginal ultrasound showed an endometrial stripe of 4 mm.  Sonohysterogram was attempted but was very limited and uncomfortable at that same visit and attempt was made with an endometrial biopsy and the pathology returned scant.  Attempted an endometrial biopsy was very painful. amounts of mucoid material and rare benign glandular epithelial cells nondiagnostic material.  The ultrasound at that time demonstrated the presence of a left ovarian mass measuring 10 x 8 mm irregular without flow no fluid in the cul-de-sac.  Uterus was noted to measure 4.4 cm in greatest length.  CA 125 was collected and returned with a value of 6.  Subsequently underwent laparoscopy for her gallbladder and the right adnexa was evaluated and appeared abnormal.  An image was obtained and is noted below.  Surgeon reported that the adnexa appeared fixed to the sidewall.   Family history is notable for breast cancer in her mother's sisters and also in the first and second cousin.  Her grandmother was diagnosed with breast cancer at the age of 67 there is no family history of ovarian or gynecologic malignancies.  She reports a history of diverticulosis and an episode of diverticulitis treated with antibiotics.  Colonoscopy and EGD in February 2019 were within normal limits.  Review of Systems:  Constitutional  Feels well,   Cardiovascular  No chest pain, shortness of breath, or edema  Pulmonary  No cough or wheeze.  Gastro Intestinal  No nausea, vomitting, or diarrhoea. No bright red blood per rectum, no abdominal pain, change in bowel movement, or constipation.  Reports bloating, appetite has  improved since cholecystectomy Genito Urinary  No frequency, urgency, dysuria, of limiting episode of vaginal bleeding Musculo Skeletal  No myalgia, arthralgia, joint swelling or pain  Neurologic  No weakness, numbness, change in gait,  Psychology  No depression, anxiety, insomnia.    Current  Meds:  Outpatient Encounter Medications as of 11/26/2017  Medication Sig  . DULoxetine (CYMBALTA) 30 MG capsule Take a 60 mg and 30 mg of cymbalta a day for a total of 90mg  a day  . DULoxetine (CYMBALTA) 60 MG capsule Take 1 capsule (60 mg total) by mouth 2 (two) times daily.  Marland Kitchen eletriptan (RELPAX) 20 MG tablet Take 1 tablet (20 mg total) by mouth as needed for migraine or headache. 1 po at onset of headache. May repeat in 2 hours if headache persists or recurs.  . fenofibrate (TRICOR) 145 MG tablet Take 1 tablet (145 mg total) by mouth daily.  Marland Kitchen lamoTRIgine 50 MG TBDP 1/2 tablet at night for 5 nights, then go up to 1 tablet at night for 2-4 weeks.  . nystatin cream (MYCOSTATIN) Apply 1 application topically 2 (two) times daily.  . ondansetron (ZOFRAN) 4 MG tablet Take 1 to 2 tablets 2 to 3 x / day if needed for Nausea  . rOPINIRole (REQUIP) 1 MG tablet 1/2- 2 pills at night for restless legs as needed  . sucralfate (CARAFATE) 1 g tablet Take 1 tablet (1 g total) by mouth 4 (four) times daily.  Marland Kitchen triamcinolone cream (KENALOG) 0.5 % Apply 1 application topically 2 (two) times daily.  . [DISCONTINUED] fenofibrate (TRICOR) 145 MG tablet Take 1 tablet (145 mg total) by mouth daily.   Facility-Administered Encounter Medications as of 11/26/2017  Medication  . ipratropium-albuterol (DUONEB) 0.5-2.5 (3) MG/3ML nebulizer solution 3 mL    Allergy:  Allergies  Allergen Reactions  . Erythromycin Other (See Comments)    TBS  . Ivp Dye [Iodinated Diagnostic Agents] Other (See Comments)    Hot flashes, itching scalp  . Penicillins Hives  . Clarithromycin Other (See Comments)    Itching ankles  . Brintellix [Vortioxetine]     Itching  . Macrodantin [Nitrofurantoin]     Social Hx:   Social History   Socioeconomic History  . Marital status: Divorced    Spouse name: Not on file  . Number of children: 2  . Years of education: 78  . 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 Financial resource strain: Not on file  . Food insecurity:    Worry: Not on file    Inability: Not on file  . Transportation needs:    Medical: Not on file    Non-medical: Not on file  Tobacco Use  . Smoking status: Never Smoker  . Smokeless tobacco: Never Used  Substance and Sexual Activity  . Alcohol use: Yes    Alcohol/week: 0.6 oz    Types: 1 Shots of liquor per week    Comment: Once a year  . Drug use: No  . Sexual activity: Not on file  Lifestyle  . Physical activity:    Days per week: Not on file    Minutes per session: Not on file  . Stress: Not on file  Relationships  . Social connections:    Talks on phone: Not on file    Gets together: Not on file    Attends religious service: Not on file  Active member of club or organization: Not on file    Attends meetings of clubs or organizations: Not on file    Relationship status: Not on file  . Intimate partner violence:    Fear of current or ex partner: Not on file    Emotionally abused: Not on file    Physically abused: Not on file    Forced sexual activity: Not on file  Other Topics Concern  . Not on file  Social History Narrative   Patient is single and lives at home alone. Patient is flight attendant. Patient has college education one year      Right handed.   Caffeine- Rare.     Past Surgical Hx:  Past Surgical History:  Procedure Laterality Date  . BREAST BIOPSY Right 02/27/2014   Stereo- Benign  . CHOLECYSTECTOMY  11/03/2017   Dr. Rosendo Gros  . kidney stone removed    . NASAL SINUS SURGERY    . perforated eardrum     left side  . TONSILLECTOMY AND ADENOIDECTOMY      Past Medical Hx:  Past Medical History:  Diagnosis Date  . Anemia   . Depression   . GERD (gastroesophageal reflux disease)   . Hay fever   . Head trauma   . IBS (irritable bowel syndrome)   . Memory loss   . Migraines   . OSA on CPAP   . Reflux   .  RLS (restless legs syndrome)   . Sinus congestion     Past Gynecological History: Gravida 3 para 2 no history of abnormal Pap tests postmenopausal since the age of 34 with one episode of abnormal uterine bleeding at age 27  Family Hx:  Family History  Problem Relation Age of Onset  . Diabetes Mother   . Diabetes Sister   . Breast cancer Maternal Grandmother 18  . Breast cancer Maternal Aunt 6  . Breast cancer Cousin     Vitals:  Blood pressure 133/63, pulse 94, temperature 98.1 F (36.7 C), temperature source Oral, resp. rate 20, weight 177 lb (80.3 kg), SpO2 100 %.  Physical Exam: WD in NAD Neck  Supple NROM, without any enlargements.  Lymph Node Survey No cervical supraclavicular or inguinal adenopathy Cardiovascular  Pulse normal rate, regularity and rhythm. S1 and S2 normal.  Lungs  Clear to auscultation bilaterally, without wheezes/crackles/rhonchi. Good air movement.  Skin  No rash/lesions/breakdown  Psychiatry  Alert and oriented appropriate mood affect speech and reasoning. Abdomen  Normoactive bowel sounds, abdomen soft, non-tender.  Back No CVA tenderness Genito Urinary  Vulva/vagina: Normal external female genitalia.  No lesions. No discharge or bleeding.  Bladder/urethra:  No lesions or masses  Vagina: Atrophic cervix: Normal appearing, no lesions.  Approximately 4 cm  Uterus: Mobile, no parametrial involvement or nodularity.  Adnexa: No palpable masses.  No cul-de-sac nodularity Rectal  Good tone, no masses no cul de sac nodularity.  Extremities  No bilateral cyanosis, clubbing or edema.   Janie Morning, MD, PhD 11/26/2017, 5:57 PM

## 2017-11-26 NOTE — Patient Instructions (Addendum)
Rash does not appear to be from Lamictal but if can cause a rash and a severe rash. If you get any more rashes, rash in your mouth please stop the Lamictal Add on Claritin Do Lamictal 50mg  twice a day, continue Cymbalta in the morning.   Put 2 creams on your leg twice a day x 2 weeks   Rash A rash is a change in the color of the skin. A rash can also change the way your skin feels. There are many different conditions and factors that can cause a rash. Follow these instructions at home: Pay attention to any changes in your symptoms. Follow these instructions to help with your condition: Medicine Take or apply over-the-counter and prescription medicines only as told by your health care provider. These may include:  Corticosteroid cream.  Anti-itch lotions.  Oral antihistamines.  Skin Care  Apply cool compresses to the affected areas.  Try taking a bath with: ? Epsom salts. Follow the instructions on the packaging. You can get these at your local pharmacy or grocery store. ? Baking soda. Pour a small amount into the bath as told by your health care provider. ? Colloidal oatmeal. Follow the instructions on the packaging. You can get this at your local pharmacy or grocery store.  Try applying baking soda paste to your skin. Stir water into baking soda until it reaches a paste-like consistency.  Do not scratch or rub your skin.  Avoid covering the rash. Make sure the rash is exposed to air as much as possible. General instructions  Avoid hot showers or baths, which can make itching worse. A cold shower may help.  Avoid scented soaps, detergents, and perfumes. Use gentle soaps, detergents, perfumes, and other cosmetic products.  Avoid any substance that causes your rash. Keep a journal to help track what causes your rash. Write down: ? What you eat. ? What cosmetic products you use. ? What you drink. ? What you wear. This includes jewelry.  Keep all follow-up visits as told by  your health care provider. This is important. Contact a health care provider if:  You sweat at night.  You lose weight.  You urinate more than normal.  You feel weak.  You vomit.  Your skin or the whites of your eyes look yellow (jaundice).  Your skin: ? Tingles. ? Is numb.  Your rash: ? Does not go away after several days. ? Gets worse.  You are: ? Unusually thirsty. ? More tired than normal.  You have: ? New symptoms. ? Pain in your abdomen. ? A fever. ? Diarrhea. Get help right away if:  You develop a rash that covers all or most of your body. The rash may or may not be painful.  You develop blisters that: ? Are on top of the rash. ? Grow larger or grow together. ? Are painful. ? Are inside your nose or mouth.  You develop a rash that: ? Looks like purple pinprick-sized spots all over your body. ? Has a "bull's eye" or looks like a target. ? Is not related to sun exposure, is red and painful, and causes your skin to peel. This information is not intended to replace advice given to you by your health care provider. Make sure you discuss any questions you have with your health care provider. Document Released: 07/25/2002 Document Revised: 01/08/2016 Document Reviewed: 12/20/2014 Elsevier Interactive Patient Education  2018 [employer].

## 2017-12-03 ENCOUNTER — Encounter: Payer: Self-pay | Admitting: Internal Medicine

## 2017-12-08 ENCOUNTER — Ambulatory Visit (HOSPITAL_COMMUNITY): Payer: BLUE CROSS/BLUE SHIELD

## 2017-12-09 ENCOUNTER — Encounter: Payer: Self-pay | Admitting: Internal Medicine

## 2017-12-10 ENCOUNTER — Other Ambulatory Visit: Payer: Self-pay | Admitting: Physician Assistant

## 2017-12-13 NOTE — Progress Notes (Deleted)
Assessment and Plan:   Bipolar depression ([place]) Has stopped all of her medications Will start on cymabalta Suggest counseling  Elevated BP without diagnosis of hypertension - continue medications, DASH diet, exercise and monitor at home. Call if greater than 130/80.  -     TSH  Vitamin D deficiency -     VITAMIN D 25 Hydroxy (Vit-D Deficiency, Fractures)  Mixed hyperlipidemia -     Lipid panel -     fenofibrate (TRICOR) 145 MG tablet; Take 1 tablet (145 mg total) by mouth daily.  Anemia, unspecified type -     CBC with Differential/Platelet -     Iron,Total/Total Iron Binding Cap -     Methylmalonic acid, serum  Attention deficit disorder, unspecified hyperactivity presence Off meds at this time  Hepatomegaly -     US Abdomen Complete; Future  Myalgia ? FM start on cymabalta, increase fluids -     Uric acid -     Sedimentation rate -     CK  Dyspnea, unspecified type Declines EKG/CXR at this time, no CP, recheck iron   Continue diet and meds as discussed. Further disposition pending results of labs. Discussed med's effects and SE's.   Future Appointments  Date Time Provider [place]  12/15/2017  7:30 AM [facility] MM 3 [facility]  12/15/2017 11:15 AM Vicie Mutters, PA-C [facility] None  01/27/2018  1:15 PM Dennie Bible, NP [facility] None  02/04/2018  3:45 PM Janie Morning, MD [facility] None  03/16/2018  9:00 AM Liane Comber, NP [facility] None    HPI 67 y.o. female  presents for 3 month follow up with hypertension, hyperlipidemia, diabetes and vitamin D deficiency.  She has been feeling bad.  She will sleep 10-12 hours, on her CPAP but still feels tired in the AM.  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 She had abnormal iron and kidney studies last visit. She has stopped all her medications because she was not feeling  well, stopped lamictal, PPI, reglan,prozac, wellbutrin . She is scheduled for a colonoscopy and EGD on 11-23-2022.Father recently died in OCT from CHF.  Normal MGM 11/2016 CXR 03/2016 CT AB 02/2014 fatty liver   Lab Results  Component Value Date   IRON 11-23-59 09/23/2017   TIBC 432 09/23/2017   FERRITIN 77 07/28/2017   Lab Results  Component Value Date   GFRNONAA 56 (L) 11/10/2017    Her blood pressure has been controlled at home, today their BP is  .She does not workout. She denies chest pain, shortness of breath, dizziness.   She is on cholesterol medication and denies myalgias. Her cholesterol is not at goal. The cholesterol was:  01/20/2017: LDL Cholesterol 74 08/26/2017: Cholesterol 228; HDL 37; Triglycerides 623   She has no DM or PreDM Lab Results  Component Value Date   HGBA1C 5.3 06/12/2016   Patient is on Vitamin D supplement. 05/13/2017: Vit D, 25-Hydroxy 35  BMI is There is no height or weight on file to calculate BMI., she is working on diet and exercise. She had sleep study and is going to get on CPAP, AHI 13.  Wt Readings from Last 3 Encounters:  11/26/17 177 lb (80.3 kg)  11/26/17 177 lb (80.3 kg)  11/10/17 173 lb 6.4 oz (78.7 kg)     Current Medications:  Current Outpatient Medications on File Prior to Visit  Medication  Sig Dispense Refill  . DULoxetine (CYMBALTA) 30 MG capsule Take a 60 mg and 30 mg of cymbalta a day for a total of 90mg  a day 90 capsule 1  . DULoxetine (CYMBALTA) 60 MG capsule Take 1 capsule (60 mg total) by mouth 2 (two) times daily. 60 capsule 2  . eletriptan (RELPAX) 20 MG tablet Take 1 tablet (20 mg total) by mouth as needed for migraine or headache. 1 po at onset of headache. May repeat in 2 hours if headache persists or recurs. 24 tablet 3  . fenofibrate (TRICOR) 145 MG tablet Take 1 tablet (145 mg total) by mouth daily. 90 tablet 3  . lamoTRIgine 50 MG TBDP TAKE 1/2 TABLET AT NIGHT FOR 5 NIGHTS THEN INCREASE 1 TABLET AT NIGHT FOR 2 TO 4 WEEKS 30  tablet 0  . nystatin cream (MYCOSTATIN) Apply 1 application topically 2 (two) times daily. 30 g 1  . ondansetron (ZOFRAN) 4 MG tablet Take 1 to 2 tablets 2 to 3 x / day if needed for Nausea 60 tablet 1  . rOPINIRole (REQUIP) 1 MG tablet 1/2- 2 pills at night for restless legs as needed 60 tablet 3  . sucralfate (CARAFATE) 1 g tablet Take 1 tablet (1 g total) by mouth 4 (four) times daily. 120 tablet 1  . triamcinolone cream (KENALOG) 0.5 % Apply 1 application topically 2 (two) times daily. 80 g 2   Current Facility-Administered Medications on File Prior to Visit  Medication Dose Route Frequency Provider Last Rate Last Dose  . ipratropium-albuterol (DUONEB) 0.5-2.5 (3) MG/3ML nebulizer solution 3 mL  3 mL Nebulization Once Starlyn Skeans, PA-C       Medical History:  Past Medical History:  Diagnosis Date  . Anemia   . Depression   . GERD (gastroesophageal reflux disease)   . Hay fever   . Head trauma   . IBS (irritable bowel syndrome)   . Memory loss   . Migraines   . OSA on CPAP   . Reflux   . RLS (restless legs syndrome)   . Sinus congestion    Allergies:  Allergies  Allergen Reactions  . Erythromycin Other (See Comments)    TBS  . Ivp Dye [Iodinated Diagnostic Agents] Other (See Comments)    Hot flashes, itching scalp  . Penicillins Hives  . Clarithromycin Other (See Comments)    Itching ankles  . Brintellix [Vortioxetine]     Itching  . Macrodantin [Nitrofurantoin]      Review of Systems:  ROSsee HPI  Family history- Review and unchanged  Social history- Review and unchanged  Physical Exam: There were no vitals taken for this visit. Wt Readings from Last 3 Encounters:  11/26/17 177 lb (80.3 kg)  11/26/17 177 lb (80.3 kg)  11/10/17 173 lb 6.4 oz (78.7 kg)   General Appearance: Well nourished well developed, non-toxic appearing, in no apparent distress. Eyes: PERRLA, EOMs, conjunctiva no swelling or erythema ENT/Mouth: Ear canals clear with no erythema,  swelling, or discharge.  TMs normal bilaterally, oropharynx clear, moist, with no exudate.   Neck: Supple, thyroid normal, no JVD, no cervical adenopathy.  Respiratory: Respiratory effort normal, breath sounds clear A&P, no wheeze, rhonchi or rales noted.  No retractions, no accessory muscle usage Cardio: RRR with no MRGs. No noted edema.  Abdomen: Soft, + BS.  + RUQ tender, with possible hepatomegaly, no guarding, rebound, hernias, masses. Musculoskeletal: Full ROM, 5/5 strength, Normal gait Skin: Warm, dry without rashes, lesions, ecchymosis.  Neuro: Awake  and oriented X 3, Cranial nerves intact. No cerebellar symptoms.  Psych: normal affect, Insight and Judgment appropriate.    Vicie Mutters, PA-C 7:44 AM [facility]

## 2017-12-14 ENCOUNTER — Other Ambulatory Visit: Payer: Self-pay

## 2017-12-14 MED ORDER — ROPINIROLE HCL 1 MG PO TABS: ORAL_TABLET | ORAL | 3 refills | Status: DC

## 2017-12-15 ENCOUNTER — Ambulatory Visit: Payer: Self-pay | Admitting: Physician Assistant

## 2017-12-15 ENCOUNTER — Ambulatory Visit: Payer: BLUE CROSS/BLUE SHIELD

## 2017-12-15 ENCOUNTER — Other Ambulatory Visit: Payer: Self-pay

## 2017-12-15 MED ORDER — ROPINIROLE HCL 1 MG PO TABS: ORAL_TABLET | ORAL | 1 refills | Status: DC

## 2017-12-16 HISTORY — PX: LAPAROSCOPIC HYSTERECTOMY: SHX1926

## 2017-12-16 NOTE — Progress Notes (Deleted)
Assessment and Plan:  There are no diagnoses linked to this encounter.    Further disposition pending results of labs. Discussed med's effects and SE's.   Over 15 minutes of exam, counseling, chart review, and critical decision making was performed.   Future Appointments  Date Time Provider [place]  12/17/2017  2:00 PM Liane Comber, NP [facility] None  01/05/2018  8:30 AM [facility] MM 2 [facility]  01/27/2018  1:15 PM Dennie Bible, NP [facility] None  02/04/2018  3:45 PM Janie Morning, MD [facility] None  03/16/2018  9:00 AM Liane Comber, NP [facility] None    ------------------------------------------------------------------------------------------------------------------   HPI 67 y.o.female presents for 6 week follow up after recently being restarted on lamictal (initially 50 mg once nightly, then was increased to BID on 4/11) and cymbalta 90 mg daily for bipolar depression.   Rash?   Past Medical History:  Diagnosis Date  . Anemia   . Depression   . GERD (gastroesophageal reflux disease)   . Hay fever   . Head trauma   . IBS (irritable bowel syndrome)   . Memory loss   . Migraines   . OSA on CPAP   . Reflux   . RLS (restless legs syndrome)   . Sinus congestion      Allergies  Allergen Reactions  . Erythromycin Other (See Comments)    TBS  . Ivp Dye [Iodinated Diagnostic Agents] Other (See Comments)    Hot flashes, itching scalp  . Penicillins Hives  . Clarithromycin Other (See Comments)    Itching ankles  . Brintellix [Vortioxetine]     Itching  . Macrodantin [Nitrofurantoin]     Current Outpatient Medications on File Prior to Visit  Medication Sig  . DULoxetine (CYMBALTA) 30 MG capsule Take a 60 mg and 30 mg of cymbalta a day for a total of 90mg  a day  . DULoxetine (CYMBALTA) 60 MG capsule Take 1 capsule (60 mg total) by mouth 2 (two) times daily.  Marland Kitchen eletriptan (RELPAX) 20 MG tablet Take 1 tablet (20 mg total) by mouth as needed  for migraine or headache. 1 po at onset of headache. May repeat in 2 hours if headache persists or recurs.  . fenofibrate (TRICOR) 145 MG tablet Take 1 tablet (145 mg total) by mouth daily.  Marland Kitchen lamoTRIgine 50 MG TBDP TAKE 1/2 TABLET AT NIGHT FOR 5 NIGHTS THEN INCREASE 1 TABLET AT NIGHT FOR 2 TO 4 WEEKS  . nystatin cream (MYCOSTATIN) Apply 1 application topically 2 (two) times daily.  . ondansetron (ZOFRAN) 4 MG tablet Take 1 to 2 tablets 2 to 3 x / day if needed for Nausea  . rOPINIRole (REQUIP) 1 MG tablet 1/2- 2 pills at night for restless legs as needed  . sucralfate (CARAFATE) 1 g tablet Take 1 tablet (1 g total) by mouth 4 (four) times daily.  Marland Kitchen triamcinolone cream (KENALOG) 0.5 % Apply 1 application topically 2 (two) times daily.   Current Facility-Administered Medications on File Prior to Visit  Medication  . ipratropium-albuterol (DUONEB) 0.5-2.5 (3) MG/3ML nebulizer solution 3 mL    ROS: all negative except above.   Physical Exam:  There were no vitals taken for this visit.  General Appearance: Well nourished, in no apparent distress. Eyes: PERRLA, EOMs, conjunctiva no swelling or erythema Sinuses: No Frontal/maxillary tenderness ENT/Mouth: Ext aud canals clear, TMs without erythema, bulging. No erythema, swelling, or exudate on post pharynx.  Tonsils not swollen or erythematous. Hearing normal.  Neck: Supple, thyroid  normal.  Respiratory: Respiratory effort normal, BS equal bilaterally without rales, rhonchi, wheezing or stridor.  Cardio: RRR with no MRGs. Brisk peripheral pulses without edema.  Abdomen: Soft, + BS.  Non tender, no guarding, rebound, hernias, masses. Lymphatics: Non tender without lymphadenopathy.  Musculoskeletal: Full ROM, 5/5 strength, normal gait.  Skin: Warm, dry without rashes, lesions, ecchymosis.  Neuro: Cranial nerves intact. Normal muscle tone, no cerebellar symptoms. Sensation intact.  Psych: Awake and oriented X 3, normal affect, Insight and  Judgment appropriate.     Izora Ribas, NP 1:35 PM [facility]

## 2017-12-17 ENCOUNTER — Encounter: Payer: Self-pay | Admitting: *Deleted

## 2017-12-17 ENCOUNTER — Ambulatory Visit: Payer: Self-pay | Admitting: Adult Health

## 2017-12-17 NOTE — Progress Notes (Signed)
[place] [facility]  Clinical Social Work was referred by patient to review and complete healthcare advance directives.  Clinical Social Worker met with patient and patient's mother in [place] office.  The patient designated brother, Vilinda Boehringer, as their primary healthcare agent and oldest son, Darrelyn Morro, as their secondary agent.  Patient also completed healthcare living will.    Clinical Social Worker notarized documents and made copies for patient/family. Clinical Social Worker will send documents to medical records to be scanned into patient's chart. Clinical Social Worker encouraged patient/family to contact with any additional questions or concerns.  Maryjean Morn, MSW, LCSW Clinical Social Worker [facility] [phone]

## 2017-12-18 ENCOUNTER — Other Ambulatory Visit: Payer: Self-pay | Admitting: Physician Assistant

## 2017-12-18 DIAGNOSIS — N838 Other noninflammatory disorders of ovary, fallopian tube and broad ligament: Secondary | ICD-10-CM | POA: Diagnosis not present

## 2017-12-18 DIAGNOSIS — G4730 Sleep apnea, unspecified: Secondary | ICD-10-CM | POA: Diagnosis not present

## 2017-12-18 DIAGNOSIS — G43909 Migraine, unspecified, not intractable, without status migrainosus: Secondary | ICD-10-CM | POA: Diagnosis not present

## 2017-12-18 DIAGNOSIS — Z8719 Personal history of other diseases of the digestive system: Secondary | ICD-10-CM | POA: Diagnosis not present

## 2017-12-18 DIAGNOSIS — K589 Irritable bowel syndrome without diarrhea: Secondary | ICD-10-CM | POA: Diagnosis not present

## 2017-12-18 DIAGNOSIS — N83209 Unspecified ovarian cyst, unspecified side: Secondary | ICD-10-CM | POA: Diagnosis not present

## 2017-12-18 DIAGNOSIS — Z91041 Radiographic dye allergy status: Secondary | ICD-10-CM | POA: Diagnosis not present

## 2017-12-18 DIAGNOSIS — Z01818 Encounter for other preprocedural examination: Secondary | ICD-10-CM | POA: Diagnosis not present

## 2017-12-18 DIAGNOSIS — F329 Major depressive disorder, single episode, unspecified: Secondary | ICD-10-CM | POA: Diagnosis not present

## 2017-12-18 DIAGNOSIS — Z880 Allergy status to penicillin: Secondary | ICD-10-CM | POA: Diagnosis not present

## 2017-12-18 DIAGNOSIS — Z6828 Body mass index (BMI) 28.0-28.9, adult: Secondary | ICD-10-CM | POA: Diagnosis not present

## 2017-12-18 DIAGNOSIS — R140 Abdominal distension (gaseous): Secondary | ICD-10-CM | POA: Diagnosis not present

## 2017-12-18 DIAGNOSIS — Z9049 Acquired absence of other specified parts of digestive tract: Secondary | ICD-10-CM | POA: Diagnosis not present

## 2017-12-18 DIAGNOSIS — G2581 Restless legs syndrome: Secondary | ICD-10-CM | POA: Diagnosis not present

## 2017-12-18 DIAGNOSIS — Z79899 Other long term (current) drug therapy: Secondary | ICD-10-CM | POA: Diagnosis not present

## 2017-12-18 DIAGNOSIS — F319 Bipolar disorder, unspecified: Secondary | ICD-10-CM

## 2017-12-18 DIAGNOSIS — R1900 Intra-abdominal and pelvic swelling, mass and lump, unspecified site: Secondary | ICD-10-CM | POA: Diagnosis not present

## 2017-12-18 DIAGNOSIS — K219 Gastro-esophageal reflux disease without esophagitis: Secondary | ICD-10-CM | POA: Diagnosis not present

## 2017-12-18 DIAGNOSIS — N950 Postmenopausal bleeding: Secondary | ICD-10-CM | POA: Diagnosis not present

## 2017-12-18 DIAGNOSIS — G4733 Obstructive sleep apnea (adult) (pediatric): Secondary | ICD-10-CM | POA: Diagnosis not present

## 2017-12-23 DIAGNOSIS — D219 Benign neoplasm of connective and other soft tissue, unspecified: Secondary | ICD-10-CM | POA: Diagnosis not present

## 2017-12-23 DIAGNOSIS — N736 Female pelvic peritoneal adhesions (postinfective): Secondary | ICD-10-CM | POA: Diagnosis not present

## 2017-12-23 DIAGNOSIS — N838 Other noninflammatory disorders of ovary, fallopian tube and broad ligament: Secondary | ICD-10-CM | POA: Diagnosis not present

## 2017-12-23 DIAGNOSIS — F3130 Bipolar disorder, current episode depressed, mild or moderate severity, unspecified: Secondary | ICD-10-CM | POA: Diagnosis not present

## 2017-12-23 DIAGNOSIS — R140 Abdominal distension (gaseous): Secondary | ICD-10-CM | POA: Diagnosis not present

## 2017-12-23 DIAGNOSIS — D270 Benign neoplasm of right ovary: Secondary | ICD-10-CM | POA: Diagnosis not present

## 2017-12-23 DIAGNOSIS — G4733 Obstructive sleep apnea (adult) (pediatric): Secondary | ICD-10-CM | POA: Diagnosis not present

## 2017-12-23 DIAGNOSIS — K7689 Other specified diseases of liver: Secondary | ICD-10-CM | POA: Diagnosis not present

## 2017-12-23 DIAGNOSIS — N9489 Other specified conditions associated with female genital organs and menstrual cycle: Secondary | ICD-10-CM | POA: Diagnosis not present

## 2017-12-23 DIAGNOSIS — Z880 Allergy status to penicillin: Secondary | ICD-10-CM | POA: Diagnosis not present

## 2017-12-23 DIAGNOSIS — D261 Other benign neoplasm of corpus uteri: Secondary | ICD-10-CM | POA: Diagnosis not present

## 2017-12-23 DIAGNOSIS — N888 Other specified noninflammatory disorders of cervix uteri: Secondary | ICD-10-CM | POA: Diagnosis not present

## 2017-12-23 DIAGNOSIS — N950 Postmenopausal bleeding: Secondary | ICD-10-CM | POA: Diagnosis not present

## 2017-12-23 DIAGNOSIS — D250 Submucous leiomyoma of uterus: Secondary | ICD-10-CM | POA: Diagnosis not present

## 2017-12-23 DIAGNOSIS — N949 Unspecified condition associated with female genital organs and menstrual cycle: Secondary | ICD-10-CM | POA: Diagnosis not present

## 2017-12-23 DIAGNOSIS — E785 Hyperlipidemia, unspecified: Secondary | ICD-10-CM | POA: Diagnosis not present

## 2017-12-23 DIAGNOSIS — K9172 Accidental puncture and laceration of a digestive system organ or structure during other procedure: Secondary | ICD-10-CM | POA: Diagnosis not present

## 2017-12-23 DIAGNOSIS — Z79899 Other long term (current) drug therapy: Secondary | ICD-10-CM | POA: Diagnosis not present

## 2017-12-23 DIAGNOSIS — J302 Other seasonal allergic rhinitis: Secondary | ICD-10-CM | POA: Diagnosis not present

## 2017-12-24 DIAGNOSIS — D261 Other benign neoplasm of corpus uteri: Secondary | ICD-10-CM | POA: Diagnosis not present

## 2017-12-24 DIAGNOSIS — K7689 Other specified diseases of liver: Secondary | ICD-10-CM | POA: Diagnosis not present

## 2017-12-24 DIAGNOSIS — N888 Other specified noninflammatory disorders of cervix uteri: Secondary | ICD-10-CM | POA: Diagnosis not present

## 2017-12-24 DIAGNOSIS — N838 Other noninflammatory disorders of ovary, fallopian tube and broad ligament: Secondary | ICD-10-CM | POA: Diagnosis not present

## 2017-12-24 DIAGNOSIS — K9172 Accidental puncture and laceration of a digestive system organ or structure during other procedure: Secondary | ICD-10-CM | POA: Diagnosis not present

## 2017-12-24 DIAGNOSIS — Z880 Allergy status to penicillin: Secondary | ICD-10-CM | POA: Diagnosis not present

## 2017-12-24 DIAGNOSIS — N736 Female pelvic peritoneal adhesions (postinfective): Secondary | ICD-10-CM | POA: Diagnosis not present

## 2017-12-24 DIAGNOSIS — G4733 Obstructive sleep apnea (adult) (pediatric): Secondary | ICD-10-CM | POA: Diagnosis not present

## 2017-12-24 DIAGNOSIS — R140 Abdominal distension (gaseous): Secondary | ICD-10-CM | POA: Diagnosis not present

## 2017-12-24 DIAGNOSIS — E785 Hyperlipidemia, unspecified: Secondary | ICD-10-CM | POA: Diagnosis not present

## 2017-12-24 DIAGNOSIS — N9489 Other specified conditions associated with female genital organs and menstrual cycle: Secondary | ICD-10-CM | POA: Diagnosis not present

## 2017-12-24 DIAGNOSIS — N950 Postmenopausal bleeding: Secondary | ICD-10-CM | POA: Diagnosis not present

## 2017-12-24 DIAGNOSIS — F3130 Bipolar disorder, current episode depressed, mild or moderate severity, unspecified: Secondary | ICD-10-CM | POA: Diagnosis not present

## 2017-12-24 DIAGNOSIS — J302 Other seasonal allergic rhinitis: Secondary | ICD-10-CM | POA: Diagnosis not present

## 2017-12-24 DIAGNOSIS — D270 Benign neoplasm of right ovary: Secondary | ICD-10-CM | POA: Diagnosis not present

## 2017-12-24 DIAGNOSIS — Z79899 Other long term (current) drug therapy: Secondary | ICD-10-CM | POA: Diagnosis not present

## 2017-12-26 DIAGNOSIS — Z90722 Acquired absence of ovaries, bilateral: Secondary | ICD-10-CM | POA: Diagnosis not present

## 2017-12-26 DIAGNOSIS — R112 Nausea with vomiting, unspecified: Secondary | ICD-10-CM | POA: Diagnosis not present

## 2017-12-26 DIAGNOSIS — R197 Diarrhea, unspecified: Secondary | ICD-10-CM | POA: Diagnosis not present

## 2017-12-26 DIAGNOSIS — K219 Gastro-esophageal reflux disease without esophagitis: Secondary | ICD-10-CM | POA: Diagnosis not present

## 2017-12-26 DIAGNOSIS — R110 Nausea: Secondary | ICD-10-CM | POA: Diagnosis not present

## 2017-12-26 DIAGNOSIS — K6389 Other specified diseases of intestine: Secondary | ICD-10-CM | POA: Diagnosis not present

## 2017-12-26 DIAGNOSIS — G4733 Obstructive sleep apnea (adult) (pediatric): Secondary | ICD-10-CM | POA: Diagnosis not present

## 2017-12-26 DIAGNOSIS — Z90710 Acquired absence of both cervix and uterus: Secondary | ICD-10-CM | POA: Diagnosis not present

## 2017-12-26 DIAGNOSIS — R05 Cough: Secondary | ICD-10-CM | POA: Diagnosis not present

## 2017-12-26 DIAGNOSIS — R1084 Generalized abdominal pain: Secondary | ICD-10-CM | POA: Diagnosis not present

## 2017-12-26 DIAGNOSIS — F329 Major depressive disorder, single episode, unspecified: Secondary | ICD-10-CM | POA: Diagnosis not present

## 2017-12-26 DIAGNOSIS — K9131 Postprocedural partial intestinal obstruction: Secondary | ICD-10-CM | POA: Diagnosis not present

## 2017-12-26 DIAGNOSIS — K5900 Constipation, unspecified: Secondary | ICD-10-CM | POA: Diagnosis not present

## 2017-12-26 DIAGNOSIS — T797XXA Traumatic subcutaneous emphysema, initial encounter: Secondary | ICD-10-CM | POA: Diagnosis not present

## 2017-12-26 DIAGNOSIS — R509 Fever, unspecified: Secondary | ICD-10-CM | POA: Diagnosis not present

## 2017-12-27 DIAGNOSIS — K6389 Other specified diseases of intestine: Secondary | ICD-10-CM | POA: Diagnosis not present

## 2017-12-27 DIAGNOSIS — R112 Nausea with vomiting, unspecified: Secondary | ICD-10-CM | POA: Diagnosis not present

## 2017-12-27 DIAGNOSIS — K9131 Postprocedural partial intestinal obstruction: Secondary | ICD-10-CM | POA: Diagnosis not present

## 2017-12-30 DIAGNOSIS — K6389 Other specified diseases of intestine: Secondary | ICD-10-CM | POA: Diagnosis not present

## 2017-12-31 ENCOUNTER — Ambulatory Visit: Payer: BLUE CROSS/BLUE SHIELD | Admitting: Gynecologic Oncology

## 2018-01-02 DIAGNOSIS — K9131 Postprocedural partial intestinal obstruction: Secondary | ICD-10-CM | POA: Diagnosis not present

## 2018-01-02 MED ORDER — LAMOTRIGINE 100 MG PO TABS
100.00 | ORAL_TABLET | ORAL | Status: DC
Start: 2018-01-03 — End: 2018-01-02

## 2018-01-02 MED ORDER — SIMETHICONE 80 MG PO CHEW
80.00 | CHEWABLE_TABLET | ORAL | Status: DC
Start: ? — End: 2018-01-02

## 2018-01-02 MED ORDER — GENERIC EXTERNAL MEDICATION
90.00 | Status: DC
Start: 2018-01-03 — End: 2018-01-02

## 2018-01-02 MED ORDER — FAMOTIDINE 40 MG PO TABS
40.00 | ORAL_TABLET | ORAL | Status: DC
Start: 2018-01-02 — End: 2018-01-02

## 2018-01-02 MED ORDER — OXYCODONE HCL 5 MG PO TABS
5.00 | ORAL_TABLET | ORAL | Status: DC
Start: ? — End: 2018-01-02

## 2018-01-02 MED ORDER — ACETAMINOPHEN 325 MG PO TABS
650.00 | ORAL_TABLET | ORAL | Status: DC
Start: 2018-01-02 — End: 2018-01-02

## 2018-01-02 MED ORDER — GENERIC EXTERNAL MEDICATION
30.00 | Status: DC
Start: ? — End: 2018-01-02

## 2018-01-02 MED ORDER — PROMETHAZINE HCL 25 MG/ML IJ SOLN
12.50 | INTRAMUSCULAR | Status: DC
Start: ? — End: 2018-01-02

## 2018-01-02 MED ORDER — DIPHENHYDRAMINE HCL 25 MG PO CAPS
25.00 | ORAL_CAPSULE | ORAL | Status: DC
Start: ? — End: 2018-01-02

## 2018-01-02 MED ORDER — LORATADINE 10 MG PO TABS
10.00 | ORAL_TABLET | ORAL | Status: DC
Start: 2018-01-03 — End: 2018-01-02

## 2018-01-02 MED ORDER — MAGNESIUM OXIDE 400 MG PO TABS
400.00 | ORAL_TABLET | ORAL | Status: DC
Start: 2018-01-03 — End: 2018-01-02

## 2018-01-02 MED ORDER — ONDANSETRON HCL 4 MG/2ML IJ SOLN
4.00 | INTRAMUSCULAR | Status: DC
Start: ? — End: 2018-01-02

## 2018-01-02 MED ORDER — ROPINIROLE HCL 2 MG PO TABS
2.00 | ORAL_TABLET | ORAL | Status: DC
Start: 2018-01-02 — End: 2018-01-02

## 2018-01-02 MED ORDER — ENOXAPARIN SODIUM 40 MG/0.4ML ~~LOC~~ SOLN
40.00 | SUBCUTANEOUS | Status: DC
Start: 2018-01-03 — End: 2018-01-02

## 2018-01-04 ENCOUNTER — Telehealth: Payer: Self-pay

## 2018-01-05 ENCOUNTER — Ambulatory Visit: Payer: BLUE CROSS/BLUE SHIELD

## 2018-01-05 ENCOUNTER — Ambulatory Visit
Admission: RE | Admit: 2018-01-05 | Discharge: 2018-01-05 | Disposition: A | Discharge status: Home / Self Care | Payer: BLUE CROSS/BLUE SHIELD | Source: Ambulatory Visit | Attending: Physician Assistant | Admitting: Physician Assistant

## 2018-01-05 DIAGNOSIS — Z1231 Encounter for screening mammogram for malignant neoplasm of breast: Secondary | ICD-10-CM

## 2018-01-07 ENCOUNTER — Other Ambulatory Visit: Payer: Self-pay | Admitting: Physician Assistant

## 2018-01-11 NOTE — Progress Notes (Signed)
[place]     This very nice 67 y.o. DWF  was admitted to the hospital on 05.11.2019 and patient was discharged from the hospital on  05.18.2019. The patient now presents for follow up for transition from recent hospitalization.  The day after discharge our clinical staff contacted the patient to assure stability and schedule a follow up appointment. The discharge summary, medications and diagnostic test results were reviewed before meeting with the patient. The patient was admitted for:   S/P Total Robotic Hysterectomy/ BSO for Lt Ovarian Mass (benign)  Abdominal Pain, Generalized Partial Small Bowel Obstruction Clostridium Difficile Diarrhea Anemia     Patient underwent a Total Robotic Hysterectomy on 05.08.2019& BSO for a Lt Ovarian Mass (benign) and was stable and discharged. Then she was re-admitted 05.11-18.2019 for Abdominal Pain attributed to a partial small bowel obstruction vs Ileus and treated w/NG suction for bowel rest and IVF. As she improved , and diet was advanced she did have frequent watery BM's. Culture for C. Diff returned positive and she's completing day 10 of Oral Vancomycin & Flagyl. Her last CBC before discharge showed low Hgb 10.8 gm%.          Hospitalization discharge instructions and medications are reconciled with the patient.      Patient is also followed with Hypertension, Hyperlipidemia, Pre-Diabetes and Vitamin D Deficiency.      Patient is followed expectantly with labile HTN & BP has been controlled at home. Today's BP is at goal - 102/60. Patient has had no complaints of any cardiac type chest pain, palpitations, dyspnea Vertell Limber /PND, dizziness, claudication, or dependent edema.     Hyperlipidemia is not controlled with diet & meds. Patient denies myalgias or other med SE's. Last Lipids were not at goal: Lab Results  Component Value Date   CHOL 228 (H) 08/26/2017   HDL 37 (L) 08/26/2017   LDLCALC LDL cholesterol not calculated.  08/26/2017   TRIG 623 (H) 08/26/2017   CHOLHDL 6.2 (H) 08/26/2017      Also, the patient has history of PreDiabetes (A1c 6.3%/2011) and then dx'd T2_NIDDM in 2014and had been treated with Metformin .  and has had no symptoms of reactive hypoglycemia, diabetic polys, paresthesias or visual blurring.  Last A1c was at goal: Lab Results  Component Value Date   HGBA1C 5.3 06/12/2016      Further, the patient also has history of Vitamin D Deficiency ("33"/2012)  And she does not  supplement vitamin D as repeatedly advised. Last vitamin D was still not at goal:  Lab Results  Component Value Date   VD25OH 35 05/13/2017   Current Outpatient Medications on File Prior to Visit  Medication Sig  . DULoxetine (CYMBALTA) 60 MG capsule TAKE 1 CAPSULE (60 MG TOTAL) BY MOUTH 2 (TWO) TIMES DAILY.  Marland Kitchen eletriptan (RELPAX) 20 MG tablet Take 1 tablet (20 mg total) by mouth as needed for migraine or headache. 1 po at onset of headache. May repeat in 2 hours if headache persists or recurs.  . fenofibrate (TRICOR) 145 MG tablet Take 1 tablet (145 mg total) by mouth daily.  Marland Kitchen lamoTRIgine 50 MG TBDP TAKE 1/2 TABLET AT NIGHT FOR 5 NIGHTS THEN INCREASE 1 TABLET AT NIGHT FOR 2 TO 4 WEEKS  . ondansetron (ZOFRAN) 4 MG tablet Take 1 to 2 tablets 2 to 3 x / day if needed for Nausea  . rOPINIRole (REQUIP) 1 MG tablet 1/2- 2 pills at night for restless legs as needed  Current Facility-Administered Medications on File Prior to Visit  Medication  . ipratropium-albuterol (DUONEB) 0.5-2.5 (3) MG/3ML nebulizer solution 3 mL   Allergies  Allergen Reactions  . Erythromycin Other (See Comments)    TBS  . Ivp Dye [Iodinated Diagnostic Agents] Other (See Comments)    Hot flashes, itching scalp  . Penicillins Hives  . Clarithromycin Other (See Comments)    Itching ankles  . Brintellix [Vortioxetine]     Itching  . Macrodantin [Nitrofurantoin]    PMHx:   Past Medical History:  Diagnosis Date  . Anemia   . Depression   .  GERD (gastroesophageal reflux disease)   . Hay fever   . Head trauma   . IBS (irritable bowel syndrome)   . Memory loss   . Migraines   . OSA on CPAP   . Reflux   . RLS (restless legs syndrome)   . Sinus congestion    Immunization History  Administered Date(s) Administered  . Influenza, High Dose Seasonal PF 06/12/2016, 05/13/2017  . Influenza-Unspecified 06/02/2015  . PPD Test 08/19/2011, 08/30/2013, 11/09/2014  . Pneumococcal Conjugate-13 08/18/2009  . Pneumococcal Polysaccharide-23 06/12/2016  . Td 08/19/2003  . Tdap 08/30/2013  . Zoster 08/18/2010   Past Surgical History:  Procedure Laterality Date  . BREAST BIOPSY Right 02/27/2014   Stereo- Benign  . CHOLECYSTECTOMY  11/03/2017   Dr. Rosendo Gros  . kidney stone removed    . NASAL SINUS SURGERY    . perforated eardrum     left side  . TONSILLECTOMY AND ADENOIDECTOMY     FHx:    Reviewed / unchanged  SHx:    Reviewed / unchanged  Systems Review:  Constitutional: Denies fever, chills, wt changes, headaches, insomnia, fatigue, night sweats, change in appetite. Eyes: Denies redness, blurred vision, diplopia, discharge, itchy, watery eyes.  ENT: Denies discharge, congestion, post nasal drip, epistaxis, sore throat, earache, hearing loss, dental pain, tinnitus, vertigo, sinus pain, snoring.  CV: Denies chest pain, palpitations, irregular heartbeat, syncope, dyspnea, diaphoresis, orthopnea, PND, claudication or edema. Respiratory: denies cough, dyspnea, DOE, pleurisy, hoarseness, laryngitis, wheezing.  Gastrointestinal: Denies dysphagia, odynophagia, heartburn, reflux, water brash, abdominal pain or cramps, nausea, vomiting, bloating, diarrhea, constipation, hematemesis, melena, hematochezia  or hemorrhoids. Genitourinary: Denies dysuria, frequency, urgency, nocturia, hesitancy, discharge, hematuria or flank pain. Musculoskeletal: Denies arthralgias, myalgias, stiffness, jt. swelling, pain, limping or strain/sprain.  Skin:  Denies pruritus, rash, hives, warts, acne, eczema or change in skin lesion(s). Neuro: No weakness, tremor, incoordination, spasms, paresthesia or pain. Psychiatric: Denies confusion, memory loss or sensory loss. Endo: Denies change in weight, skin or hair change.  Heme/Lymph: No excessive bleeding, bruising or enlarged lymph nodes.  Physical Exam  BP 102/60   Pulse 92   Temp (!) 97.5 F (36.4 C)   Resp 16   Ht 5\' 7"  (1.702 m)   Wt 172 lb 9.6 oz (78.3 kg)   BMI 27.03 kg/m   Appears well nourished, well groomed  and in no distress.  Eyes: PERRLA, EOMs, conjunctiva no swelling or erythema. Sinuses: No frontal/maxillary tenderness ENT/Mouth: EAC's clear, TM's nl w/o erythema, bulging. Nares clear w/o erythema, swelling, exudates. Oropharynx clear without erythema or exudates. Oral hygiene is good. Tongue normal, non obstructing. Hearing intact.  Neck: Supple. Thyroid nl. Car 2+/2+ without bruits, nodes or JVD. Chest: Respirations nl with BS clear & equal w/o rales, rhonchi, wheezing or stridor.  Cor: Heart sounds normal w/ regular rate and rhythm without sig. murmurs, gallops, clicks or rubs. Peripheral pulses normal  and equal  without edema.  Abdomen: Soft & bowel sounds normal. Non-tender w/o guarding, rebound, hernias, masses or organomegaly.  Lymphatics: Unremarkable.  Musculoskeletal: Full ROM all peripheral extremities, joint stability, 5/5 strength and normal gait.  Skin: Warm, dry without exposed rashes, lesions or ecchymosis apparent.  Neuro: Cranial nerves intact, reflexes equal bilaterally. Sensory-motor testing grossly intact. Tendon reflexes grossly intact.  Pysch: Alert & oriented x 3.  Insight and judgement nl & appropriate. No ideations.  Assessment and Plan:  1. Abdominal pain, acute, generalized  - CBC with Differential/Platelet - COMPLETE METABOLIC PANEL WITH GFR - Magnesium  2. Small bowel obstruction, partial (HCC)  3. Paralytic ileus (HCC)  4.  Clostridium difficile diarrhea  - COMPLETE METABOLIC PANEL WITH GFR - Magnesium  - Clostridium difficile culture-fecal; Future - in 2 weeks after completion of treatment   5. Anemia  - CBC with Differential/Platelet  6. History of total abdominal hysterectomy  7. Status post bilateral salpingo-oophorectomy (BSO)   8. Elevated BP without diagnosis of hypertension  - Continue monitor blood pressure at home.  - Continue DASH diet. Reminder to go to the ER if any CP,  SOB, nausea, dizziness, severe HA, changes vision/speech.  - CBC with Differential/Platelet - COMPLETE METABOLIC PANEL WITH GFR - Magnesium  9. Mixed hyperlipidemia  - Continue diet/meds, exercise,& lifestyle modifications.  - Continue monitor periodic cholesterol/liver & renal functions    - COMPLETE METABOLIC PANEL WITH GFR  10. Abnormal glucose  - Continue diet, exercise, lifestyle modifications.  - Monitor appropriate labs.  - COMPLETE METABOLIC PANEL WITH GFR  11. Medication management  - CBC with Differential/Platelet - COMPLETE METABOLIC PANEL WITH GFR - Magnesium  12. Vitamin D deficiency  - Continue supplementation.        Discussed  regular exercise, BP monitoring, weight control to achieve/maintain BMI less than 25 and discussed meds and SE's. Recommended labs to assess and monitor clinical status with further disposition pending results of labs. Over 45 minutes of exam, counseling, chart review was performed.

## 2018-01-12 ENCOUNTER — Ambulatory Visit (INDEPENDENT_AMBULATORY_CARE_PROVIDER_SITE_OTHER): Payer: BLUE CROSS/BLUE SHIELD | Admitting: Internal Medicine

## 2018-01-12 ENCOUNTER — Other Ambulatory Visit: Payer: Self-pay | Admitting: Physician Assistant

## 2018-01-12 ENCOUNTER — Encounter: Payer: Self-pay | Admitting: Internal Medicine

## 2018-01-12 VITALS — BP 102/60 | HR 92 | Temp 97.5°F | Resp 16 | Ht 67.0 in | Wt 172.6 lb

## 2018-01-12 DIAGNOSIS — Z90722 Acquired absence of ovaries, bilateral: Secondary | ICD-10-CM | POA: Diagnosis not present

## 2018-01-12 DIAGNOSIS — K560 Paralytic ileus: Secondary | ICD-10-CM

## 2018-01-12 DIAGNOSIS — A0472 Enterocolitis due to Clostridium difficile, not specified as recurrent: Secondary | ICD-10-CM

## 2018-01-12 DIAGNOSIS — R1084 Generalized abdominal pain: Secondary | ICD-10-CM

## 2018-01-12 DIAGNOSIS — Z90710 Acquired absence of both cervix and uterus: Secondary | ICD-10-CM | POA: Diagnosis not present

## 2018-01-12 DIAGNOSIS — K56600 Partial intestinal obstruction, unspecified as to cause: Secondary | ICD-10-CM | POA: Diagnosis not present

## 2018-01-12 DIAGNOSIS — R030 Elevated blood-pressure reading, without diagnosis of hypertension: Secondary | ICD-10-CM

## 2018-01-12 DIAGNOSIS — Z79899 Other long term (current) drug therapy: Secondary | ICD-10-CM | POA: Diagnosis not present

## 2018-01-12 DIAGNOSIS — N63 Unspecified lump in unspecified breast: Secondary | ICD-10-CM

## 2018-01-12 DIAGNOSIS — E559 Vitamin D deficiency, unspecified: Secondary | ICD-10-CM | POA: Diagnosis not present

## 2018-01-12 DIAGNOSIS — D649 Anemia, unspecified: Secondary | ICD-10-CM

## 2018-01-12 DIAGNOSIS — R7309 Other abnormal glucose: Secondary | ICD-10-CM | POA: Diagnosis not present

## 2018-01-12 DIAGNOSIS — E782 Mixed hyperlipidemia: Secondary | ICD-10-CM | POA: Diagnosis not present

## 2018-01-12 DIAGNOSIS — F319 Bipolar disorder, unspecified: Secondary | ICD-10-CM

## 2018-01-12 LAB — COMPLETE METABOLIC PANEL WITH GFR
AG Ratio: 1.4 (calc) (ref 1.0–2.5)
ALKALINE PHOSPHATASE (APISO): 73 U/L (ref 33–130)
ALT: 10 U/L (ref 6–29)
AST: 17 U/L (ref 10–35)
Albumin: 4.3 g/dL (ref 3.6–5.1)
BILIRUBIN TOTAL: 0.3 mg/dL (ref 0.2–1.2)
BUN: 20 mg/dL (ref 7–25)
CHLORIDE: 99 mmol/L (ref 98–110)
CO2: 31 mmol/L (ref 20–32)
CREATININE: 0.88 mg/dL (ref 0.50–0.99)
Calcium: 10 mg/dL (ref 8.6–10.4)
GFR, Est African American: 79 mL/min/{1.73_m2} (ref >=60)
GFR, Est Non African American: 68 mL/min/{1.73_m2} (ref >=60)
GLUCOSE: 81 mg/dL (ref 65–99)
Globulin: 3.1 g/dL (calc) (ref 1.9–3.7)
Potassium: 4.3 mmol/L (ref 3.5–5.3)
Sodium: 138 mmol/L (ref 135–146)
Total Protein: 7.4 g/dL (ref 6.1–8.1)

## 2018-01-12 LAB — CBC WITH DIFFERENTIAL/PLATELET
Basophils Absolute: 63 cells/uL (ref 0–200)
Basophils Relative: 0.6 %
EOS ABS: 95 {cells}/uL (ref 15–500)
EOS PCT: 0.9 %
HCT: 37.6 % (ref 35.0–45.0)
Hemoglobin: 12.1 g/dL (ref 11.7–15.5)
Lymphs Abs: 1617 cells/uL (ref 850–3900)
MCH: 26.5 pg — AB (ref 27.0–33.0)
MCHC: 32.2 g/dL (ref 32.0–36.0)
MCV: 82.5 fL (ref 80.0–100.0)
MONOS PCT: 9.7 %
MPV: 8.9 fL (ref 7.5–12.5)
NEUTROS ABS: 7707 {cells}/uL (ref 1500–7800)
Neutrophils Relative %: 73.4 %
PLATELETS: 707 10*3/uL — AB (ref 140–400)
RBC: 4.56 10*6/uL (ref 3.80–5.10)
RDW: 13.3 % (ref 11.0–15.0)
TOTAL LYMPHOCYTE: 15.4 %
WBC mixed population: 1019 cells/uL — ABNORMAL HIGH (ref 200–950)
WBC: 10.5 10*3/uL (ref 3.8–10.8)

## 2018-01-12 LAB — MAGNESIUM: MAGNESIUM: 2.2 mg/dL (ref 1.5–2.5)

## 2018-01-12 NOTE — Patient Instructions (Signed)
Clostridium Difficile Infection  Clostridium difficile (C. difficile or C. diff) infection is a condition that causes inflammation of the large intestine (colon). This condition can result in damage to the lining of your colon and may lead to colitis. This infection can be passed from person to person (is contagious).  What are the causes?  C. diff is a bacterium that is normally found in the colon. This infection is caused when the balance of C. diff is changed and there is an overgrowth of C. diff. This is often caused by antibiotic use.  What increases the risk?  This condition is more likely to develop in people who:  · Take antibiotic medicines.  · Take a certain type of medicine called proton pump inhibitors over a long period of time (chronic use).  · Are older.  · Have had a C. diff infection before.  · Have serious underlying conditions, such as colon cancer.  · Are in the hospital.  · Have a weak defense (immune) system.  · Live in a place where there is a lot of contact with others, such as a nursing home.  · Have had gastrointestinal (GI) tract surgery.    What are the signs or symptoms?  Symptoms of this condition include:  · Diarrhea. This may be bloody, watery, or yellow or green in color.  · Fever.  · Fatigue.  · Loss of appetite.  · Nausea.  · Swelling, pain, or tenderness in the abdomen.  · Dehydration. Dehydration can cause you to be tired and thirsty, have a dry mouth, and urinate less frequently.    How is this diagnosed?  This condition is diagnosed with a medical history and physical exam. You may also have tests, including:  · A test that checks for C. diff in your stool.  · Blood tests.  · A sigmoidoscopy or colonoscopy to look at your colon. These procedures involve passing an instrument through your rectum to look at the inside of your colon.    How is this treated?  Treatment for this condition includes:  · Antibiotics that keep C. diff from growing.  · Stopping the antibiotics you were  on before the C. diff infection began. Only do this as told by your health care provider.  · Fluids through an IV tube, if you are dehydrated.  · Surgery to remove the infected part of the colon. This is rare.    Follow these instructions at home:  Eating and drinking  · Drink enough fluid to keep your urine clear or pale yellow. Avoid milk, caffeine, and alcohol.  · Follow specific rehydration instructions as told by your health care provider.  · Eat small, frequent meals instead of large meals.  Medicines  · Take your antibiotic medicine as told by your health care provider. Do not stop taking the antibiotic even if you start to feel better unless your health care provider told you to do that.  · Take over-the-counter and prescription medicines only as told by your health care provider.  · Do not use medicines to help with diarrhea.  General instructions  · Wash your hands thoroughly before you prepare food and after you use the bathroom. Make sure people who live with you also wash their hands often.  · Clean surfaces that you touch with a product that contains chlorine bleach.  · Keep all follow-up visits as told by your health care provider. This is important.  Contact a health care provider if:  ·   Your symptoms do not get better with treatment.  · Your symptoms get worse with treatment.  · Your symptoms go away and then return.  · You have a fever.  · You have new symptoms.  Get help right away if:  · You have increasing pain or tenderness in your abdomen.  · You have stool that is mostly bloody, or your stool looks dark black and tarry.  · You cannot eat or drink without vomiting.  · You have signs of dehydration, such as:  ? Dark urine, very little urine, or no urine.  ? Cracked lips.  ? Not making tears when you cry.  ? Dry mouth.  ? Sunken eyes.  ? Sleepiness.  ? Weakness.  ? Dizziness.  This information is not intended to replace advice given to you by your health care provider. Make sure you discuss any  questions you have with your health care provider.  Document Released: 05/14/2005 Document Revised: 01/10/2016 Document Reviewed: 02/05/2015  [employer] Interactive Patient Education © 2017 [employer]

## 2018-01-19 ENCOUNTER — Other Ambulatory Visit: Payer: Self-pay | Admitting: *Deleted

## 2018-01-19 DIAGNOSIS — R112 Nausea with vomiting, unspecified: Secondary | ICD-10-CM

## 2018-01-19 MED ORDER — ONDANSETRON HCL 4 MG PO TABS: ORAL_TABLET | ORAL | 1 refills | Status: DC

## 2018-01-20 ENCOUNTER — Encounter: Payer: Self-pay | Admitting: Gynecologic Oncology

## 2018-01-21 ENCOUNTER — Encounter: Payer: Self-pay | Admitting: Physician Assistant

## 2018-01-26 ENCOUNTER — Encounter: Payer: Self-pay | Admitting: Nurse Practitioner

## 2018-01-26 NOTE — Progress Notes (Signed)
[facility]  PATIENT: ANALILIA GEDDIS DOB: 11-Mar-1951   REASON FOR VISIT: Follow-up for restless legs and obstructive sleep apnea with  CPAP  HISTORY FROM: Patient    HISTORY OF PRESENT ILLNESS:Flara R Friddle is a 67 y.o. female , seen here as in a referral/ revisit  from Dr. Melford Aase for a new evaluation of CPAP.   Mrs. Smither had been evaluated at the [facility] in the year 2012, she stated that she did not have follow-up was placed on a CPAP and was basically followed by the medical equipment company for supplies.  She had actually to sleep studies in the past the first one did not find enough apnea to be treated were the, but PLMS and restless legs were documented. In the meantime her sleep habits have changed and her concerns, also. 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 When she is on a " dead head" trip by plane she can sleep, in theory ,but has been asked by passengers if she had RLS ?   She carries a diagnosis of asthma, which has followed bronchitic infections in the past but is not frequent. She also takes methylphenidate for ADD which is really used for daytime sleepiness. She has carried a diagnosis of GERD continues to use a PPI, restless legs started getting on iron supplement, increased her walking.   Seasonal depression . Hep C ab positive. she will have regular screening labs with Vicie Mutters yearly. Labs were reviewed - normal HbA1c.   Sleep habits are as follows: The patient's intended bedtime is before 10 PM but due to her work requirements and residing between [place] and [place] she does have some irregularities. She can fall asleep within minutes she states, she can stay asleep for a period of several hours, and if she wouldn't have a bathroom break in between she would sleep uninterrupted sleep. She has no trouble sleeping for 12  hours if the opportunity arises. Her restless legs do not keep her from falling asleep. They are more PLMS evident while she sleeping. Her rise time is between 3 and 5 AM depending on which flight she has to attend to. 6 hours of nocturnal sleep. If she has some opportunity to sleep in daytime , she will do so and feels refreshed.  UPDATE 12/12/2018CM Ms. Zavaleta, 67 year old female returns for follow-up with a history of obstructive sleep apnea newly on CPAP.  In addition she has restless legs with and is on Requip.  She also relates that her father recently died of congestive heart failure the end of October.  CPAP compliance data dated 06/30/2017 to 07/29/2017 shows greater than 4 hours for 27 days and 90%.  Average usage 6 hours 47 minutes.  Set pressure 7 cm.  EPR level 1.  AHI 0.8.  She returns for reevaluation UPDATE 6/12/2019CM Ms. Jaspers, 67 year old female returns for follow-up with history of obstructive sleep apnea on CPAP.  She is having a problem with her mask, she has  not called the equipment company.  Her restless legs are well controlled on Requip.  CPAP data dated 12/28/2017-01/26/2018 shows compliance greater than 4 hours at 77%.  Average usage 5 hours 35 minutes.  Set pressure 7 cm.  EPR level 3 leak 95th percentile at 40.  AHI 3.3 ESS 15 fatigue severity scale 55.  She had her gallbladder removed in March and  a hysterectomy in May.  She returns for reevaluation REVIEW OF SYSTEMS: Full 14 system review of systems performed and notable only for those listed, all others are neg:  Constitutional: neg  Cardiovascular: neg Ear/Nose/Throat: neg  Skin: neg Eyes: neg Respiratory: neg Gastroitestinal: neg Hematology/Lymphatic: neg  Endocrine: neg Musculoskeletal: Joint pain Allergy/Immunology: neg Neurological: neg Psychiatric: Depression Sleep : Obstructive sleep apnea with CPAP, restless legs   ALLERGIES: Allergies  Allergen Reactions  . Erythromycin Other (See Comments)    TBS  .  Ivp Dye [Iodinated Diagnostic Agents] Other (See Comments)    Hot flashes, itching scalp  . Penicillins Hives  . Clarithromycin Other (See Comments)    Itching ankles  . Brintellix [Vortioxetine]     Itching  . Macrodantin [Nitrofurantoin]     HOME MEDICATIONS: Outpatient Medications Prior to Visit  Medication Sig Dispense Refill  . DULoxetine (CYMBALTA) 60 MG capsule TAKE 1 CAPSULE (60 MG TOTAL) BY MOUTH 2 (TWO) TIMES DAILY. 180 capsule 1  . eletriptan (RELPAX) 20 MG tablet Take 1 tablet (20 mg total) by mouth as needed for migraine or headache. 1 po at onset of headache. May repeat in 2 hours if headache persists or recurs. 24 tablet 3  . fenofibrate (TRICOR) 145 MG tablet Take 1 tablet (145 mg total) by mouth daily. 90 tablet 3  . lamoTRIgine (LAMICTAL) 100 MG tablet TAKE 1 TABLET BY MOUTH EVERY DAY 30 tablet 2  . ondansetron (ZOFRAN) 4 MG tablet Take 1 to 2 tablets 2 to 3 x / day if needed for Nausea 60 tablet 1  . rOPINIRole (REQUIP) 1 MG tablet 1/2- 2 pills at night for restless legs as needed 180 tablet 1  . lamoTRIgine 50 MG TBDP TAKE 1/2 TABLET AT NIGHT FOR 5 NIGHTS THEN INCREASE 1 TABLET AT NIGHT FOR 2 TO 4 WEEKS (Patient not taking: Reported on 01/27/2018) 30 tablet 0   Facility-Administered Medications Prior to Visit  Medication Dose Route Frequency Provider Last Rate Last Dose  . ipratropium-albuterol (DUONEB) 0.5-2.5 (3) MG/3ML nebulizer solution 3 mL  3 mL Nebulization Once Starlyn Skeans, PA-C        PAST MEDICAL HISTORY: Past Medical History:  Diagnosis Date  . Anemia   . Depression   . GERD (gastroesophageal reflux disease)   . Hay fever   . Head trauma   . IBS (irritable bowel syndrome)   . Memory loss   . Migraines   . OSA on CPAP   . Reflux   . RLS (restless legs syndrome)   . Sinus congestion     PAST SURGICAL HISTORY: Past Surgical History:  Procedure Laterality Date  . BREAST BIOPSY Right 02/27/2014   Stereo- Benign  . CHOLECYSTECTOMY   11/03/2017   Dr. Rosendo Gros  . kidney stone removed    . NASAL SINUS SURGERY    . perforated eardrum     left side  . TONSILLECTOMY AND ADENOIDECTOMY      FAMILY HISTORY: Family History  Problem Relation Age of Onset  . Diabetes Mother   . Diabetes Sister   . Breast cancer Maternal Grandmother 58  . Breast cancer Maternal Aunt 7  . Breast cancer Cousin     SOCIAL HISTORY: Social History   Socioeconomic History  . Marital status: Divorced    Spouse name: Not on file  . Number of children: 2  . Years of education: 94  . Highest education level: Not on file  Occupational History    Employer: [employer]  Comment: Flight Attendant  Social Needs  . Financial resource strain: Not on file  . Food insecurity:    Worry: Not on file    Inability: Not on file  . Transportation needs:    Medical: Not on file    Non-medical: Not on file  Tobacco Use  . Smoking status: Never Smoker  . Smokeless tobacco: Never Used  Substance and Sexual Activity  . Alcohol use: Yes    Alcohol/week: 0.6 oz    Types: 1 Shots of liquor per week    Comment: Once a year  . Drug use: No  . Sexual activity: Not on file  Lifestyle  . Physical activity:    Days per week: Not on file    Minutes per session: Not on file  . Stress: Not on file  Relationships  . Social connections:    Talks on phone: Not on file    Gets together: Not on file    Attends religious service: Not on file    Active member of club or organization: Not on file    Attends meetings of clubs or organizations: Not on file    Relationship status: Not on file  . Intimate partner violence:    Fear of current or ex partner: Not on file    Emotionally abused: Not on file    Physically abused: Not on file    Forced sexual activity: Not on file  Other Topics Concern  . Not on file  Social History Narrative   Patient is single and lives at home alone. Patient is flight attendant. Patient has college education one year        Right handed.   Caffeine- Rare.      PHYSICAL EXAM  Vitals:   01/27/18 0820  BP: 115/74  Pulse: 86  Weight: 176 lb 12.8 oz (80.2 kg)  Height: 5\' 7"  (1.702 m)   Body mass index is 27.69 kg/m.  Generalized: Well developed, in no acute distress  Head: normocephalic and atraumatic,. Oropharynx benign mallopatti 2 Neck: Supple, circumference 15 Lungs clear Musculoskeletal: No deformity  Skin no edema Neurological examination   Mentation: Alert oriented to time, place, history taking. Attention span and concentration appropriate. Recent and remote memory intact.  Follows all commands speech and language fluent.   Cranial nerve II-XII: .Pupils were equal round reactive to light extraocular movements were full, visual field were full on confrontational test. Facial sensation and strength were normal. hearing was intact to finger rubbing bilaterally. Uvula tongue midline. head turning and shoulder shrug were normal and symmetric.Tongue protrusion into cheek strength was normal. Motor: normal bulk and tone, full strength in the BUE, BLE,  Sensory: normal and symmetric to light touch, in the upper and lower extremities Coordination: finger-nose-finger, heel-to-shin bilaterally, no dysmetria Gait and Station: Rising up from seated position without assistance, normal stance,  moderate stride, good arm swing, smooth turning, able to perform tiptoe, and heel walking without difficulty. Tandem gait is steady  DIAGNOSTIC DATA (LABS, IMAGING, TESTING) - I reviewed patient records, labs, notes, testing and imaging myself where available.  Lab Results  Component Value Date   WBC 10.5 01/12/2018   HGB 12.1 01/12/2018   HCT 37.6 01/12/2018   MCV 82.5 01/12/2018   PLT 707 (H) 01/12/2018      Component Value Date/Time   NA 138 01/12/2018 0941   K 4.3 01/12/2018 0941   CL 99 01/12/2018 0941   CO2 31 01/12/2018 0941   GLUCOSE 81 01/12/2018  0941   BUN 20 01/12/2018 0941   CREATININE 0.88  01/12/2018 0941   CALCIUM 10.0 01/12/2018 0941   PROT 7.4 01/12/2018 0941   ALBUMIN 4.2 01/20/2017 1006   AST 17 01/12/2018 0941   ALT 10 01/12/2018 0941   ALKPHOS 86 01/20/2017 1006   BILITOT 0.3 01/12/2018 0941   GFRNONAA 68 01/12/2018 0941   GFRAA 79 01/12/2018 0941   Lab Results  Component Value Date   CHOL 228 (H) 08/26/2017   HDL 37 (L) 08/26/2017   LDLCALC  08/26/2017     Comment:     . LDL cholesterol not calculated. Triglyceride levels greater than 400 mg/dL invalidate calculated LDL results. . Reference range: <100 . Desirable range <100 mg/dL for primary prevention;   <70 mg/dL for patients with CHD or diabetic patients  with > or = 2 CHD risk factors. Marland Kitchen LDL-C is now calculated using the Kenidi Elenbaas-Hopkins  calculation, which is a validated novel method providing  better accuracy than the Friedewald equation in the  estimation of LDL-C.  Cresenciano Genre et al. Annamaria Helling. 6283;662(94): 2061-2068  (http://education.[facility].com/faq/FAQ164)    TRIG 623 (H) 08/26/2017   CHOLHDL 6.2 (H) 08/26/2017   Lab Results  Component Value Date   HGBA1C 5.3 06/12/2016   Lab Results  Component Value Date   VITAMINB12 708 12/09/2016   Lab Results  Component Value Date   TSH 1.95 08/26/2017      ASSESSMENT AND PLAN  67 y.o. year old female  has a past medical history of Anemia, Depression, GERD (gastroesophageal reflux disease), Hay fever, Head trauma, IBS (irritable bowel syndrome), Memory loss, Migraines, OSA on CPAP, Reflux, RLS (restless legs syndrome), and Sinus congestion. here to follow-up for  CPAP compliance and restless legs. CPAP data dated 12/28/2017-01/26/2018 shows compliance greater than 4 hours at 77%.  Average usage 5 hours 35 minutes.  Set pressure 7 cm.  EPR level 3 leak 95th percentile at 40.  AHI 3.3 ESS 15 fatigue severity scale 55. Marland Kitchen      CPAP compliance 77% Has significant leak needs mask refit Continue same settings Continue  Requip for restless legs  refilled by PCP Follow-up in 6 months Dennie Bible, [facility], [facility], APRN  [facility] [address] [place], [place] [address] [phone]

## 2018-01-27 ENCOUNTER — Ambulatory Visit: Payer: BLUE CROSS/BLUE SHIELD | Admitting: Nurse Practitioner

## 2018-01-27 ENCOUNTER — Encounter: Payer: Self-pay | Admitting: Nurse Practitioner

## 2018-01-27 VITALS — BP 115/74 | HR 86 | Ht 67.0 in | Wt 176.8 lb

## 2018-01-27 DIAGNOSIS — Z9989 Dependence on other enabling machines and devices: Secondary | ICD-10-CM

## 2018-01-27 DIAGNOSIS — G4733 Obstructive sleep apnea (adult) (pediatric): Secondary | ICD-10-CM

## 2018-01-27 DIAGNOSIS — G2581 Restless legs syndrome: Secondary | ICD-10-CM | POA: Diagnosis not present

## 2018-01-27 DIAGNOSIS — A0472 Enterocolitis due to Clostridium difficile, not specified as recurrent: Secondary | ICD-10-CM | POA: Diagnosis not present

## 2018-01-27 NOTE — Progress Notes (Signed)
I spoke to pt and relayed that per [facility] that she is to call them [phone] to transfer care to [place].  She would do this.  She also would take her cpap machines by [facility] and get new sd cards.

## 2018-01-27 NOTE — Patient Instructions (Signed)
CPAP compliance 77% Has significant leak needs mask refit Continue same settings Continue  Requip for restless legs Follow-up in 6 months

## 2018-01-27 NOTE — Addendum Note (Signed)
Addended by: Eulis Canner on: 01/27/2018 04:45 PM   Modules accepted: Orders

## 2018-01-28 ENCOUNTER — Ambulatory Visit (INDEPENDENT_AMBULATORY_CARE_PROVIDER_SITE_OTHER): Payer: BLUE CROSS/BLUE SHIELD | Admitting: Internal Medicine

## 2018-01-28 VITALS — BP 114/88 | HR 92 | Temp 97.0°F | Resp 16 | Ht 67.0 in | Wt 176.2 lb

## 2018-01-28 DIAGNOSIS — R030 Elevated blood-pressure reading, without diagnosis of hypertension: Secondary | ICD-10-CM

## 2018-01-28 DIAGNOSIS — F988 Other specified behavioral and emotional disorders with onset usually occurring in childhood and adolescence: Secondary | ICD-10-CM | POA: Diagnosis not present

## 2018-01-28 DIAGNOSIS — F319 Bipolar disorder, unspecified: Secondary | ICD-10-CM

## 2018-01-28 MED ORDER — METHYLPHENIDATE HCL ER (CD) 10 MG PO CPCR: ORAL_CAPSULE | ORAL | 0 refills | Status: DC

## 2018-01-31 ENCOUNTER — Encounter: Payer: Self-pay | Admitting: Internal Medicine

## 2018-01-31 NOTE — Progress Notes (Signed)
  Subjective:    Patient ID: Kimberly Ray, female    DOB: [birth year], 67 y.o.   MRN: [id number]  HPI  This very nice 67 yo DWF was hospitalized in Feb for lap Cholecystectomy on  11/03/2017. Then she had Robotic Hysterectomy /BSO for Ovarian mass (benign) on 12/23/2017. Subsequently she was re-hospitalized from 5/11-18/2009 for partial SBO treated conservatively. Then she was found to have and treated for C.Diff colitis. She presents now for clearance to return to work.     Patient  has also been followed for Depression & ADD.  Medication Sig  . DULoxetine (CYMBALTA) 60 MG capsule TAKE 1 CAPSULE (60 MG TOTAL) BY MOUTH 2 (TWO) TIMES DAILY.  Marland Kitchen eletriptan (RELPAX) 20 MG tablet Take 1 tablet (20 mg total) by mouth as needed for migraine or headache. 1 po at onset of headache. May repeat in 2 hours if headache persists or recurs.  . fenofibrate (TRICOR) 145 MG tablet Take 1 tablet (145 mg total) by mouth daily.  Marland Kitchen lamoTRIgine (LAMICTAL) 100 MG tablet TAKE 1 TABLET BY MOUTH EVERY DAY  . ondansetron (ZOFRAN) 4 MG tablet Take 1 to 2 tablets 2 to 3 x / day if needed for Nausea  . rOPINIRole (REQUIP) 1 MG tablet 1/2- 2 pills at night for restless legs as needed   Allergies  Allergen Reactions  . Erythromycin Other (See Comments)    TBS  . Ivp Dye [Iodinated Diagnostic Agents] Other (See Comments)    Hot flashes, itching scalp  . Penicillins Hives  . Clarithromycin Other (See Comments)    Itching ankles  . Brintellix [Vortioxetine]     Itching  . Macrodantin [Nitrofurantoin]     Past Medical History:  Diagnosis Date  . Anemia   . Depression   . GERD (gastroesophageal reflux disease)   . Hay fever   . Head trauma   . IBS (irritable bowel syndrome)   . Memory loss   . Migraines   . OSA on CPAP   . Reflux   . RLS (restless legs syndrome)   . Sinus congestion    Past Surgical History:  Procedure Laterality Date  . BREAST BIOPSY Right 02/27/2014   Stereo- Benign  . CHOLECYSTECTOMY   11/03/2017   Dr. Rosendo Gros  . kidney stone removed    . LAPAROSCOPIC HYSTERECTOMY  12/2017  . NASAL SINUS SURGERY    . perforated eardrum     left side  . TONSILLECTOMY AND ADENOIDECTOMY     Review of Systems    10 point systems review negative except as above.    Objective:   Physical Exam  BP 114/88   Pulse 92   Temp (!) 97 F (36.1 C)   Resp 16   Ht 5\' 7"  (1.702 m)   Wt 176 lb 3.2 oz (79.9 kg)   BMI 27.60 kg/m   In no Distress.  HEENT - WNL. Neck - supple.  Chest - Clear equal BS. Cor - Nl HS. RRR w/o sig MGR. PP 1(+). No edema. MS- FROM w/o deformities.  Gait Nl. Neuro -  Nl w/o focal abnormalities.    Assessment & Plan:   1. Elevated BP without diagnosis of hypertension  2. Bipolar depression ([place])  - methylphenidate (METADATE CD) 10 MG CR capsule; Take 1 capsule 3 x/day for ADD  Dispense: 90 capsule  3. Attention deficit disorder  - methylphenidate (METADATE CD) 10 MG CR capsule; Take 1 capsule 3 x/day for ADD  Dispense: 90 capsule

## 2018-02-01 LAB — CLOSTRIDIUM DIFFICILE CULTURE-FECAL

## 2018-02-04 ENCOUNTER — Encounter: Payer: Self-pay | Admitting: Gynecologic Oncology

## 2018-02-04 ENCOUNTER — Inpatient Hospital Stay: Payer: BLUE CROSS/BLUE SHIELD | Attending: Gynecologic Oncology | Admitting: Gynecologic Oncology

## 2018-02-04 VITALS — BP 110/81 | HR 93 | Temp 97.6°F | Resp 20 | Ht 67.0 in | Wt 176.1 lb

## 2018-02-04 DIAGNOSIS — Z90710 Acquired absence of both cervix and uterus: Secondary | ICD-10-CM | POA: Diagnosis not present

## 2018-02-04 DIAGNOSIS — Z90722 Acquired absence of ovaries, bilateral: Secondary | ICD-10-CM | POA: Diagnosis not present

## 2018-02-04 DIAGNOSIS — N83209 Unspecified ovarian cyst, unspecified side: Secondary | ICD-10-CM | POA: Insufficient documentation

## 2018-02-04 DIAGNOSIS — N9489 Other specified conditions associated with female genital organs and menstrual cycle: Secondary | ICD-10-CM

## 2018-02-04 NOTE — Telephone Encounter (Signed)
Error opening  

## 2018-02-04 NOTE — Progress Notes (Signed)
Consult Note: Gyn-Onc  Consult was requested by Dr. Royston Sinner  for the evaluation of Kimberly Ray 67 y.o. female  CC:  Chief Complaint  Patient presents with  . Adnexal mass    Assessment/Plan:  [name] is a 67 y.o. with self-limiting uterine bleeding, an endometrial stripe of 4 mm, an ultrasound that shows a 10 mm irregular cyst and laparoscopic findings notable for an adnexa with excrescences.  CA125 is 6.  She underwent hysterectomy bilateral salpingo-oophorectomy and a minimally invasive fashion on Dec 23, 2017.  Pathology was unremarkable for malignancy however her postop course was complicated by a partial small bowel obstruction and C. difficile.   She is doing well.  Physical examination is notable for a vagina the still appears quite bruised otherwise abdominal incisions have healed well. Follow-up with Dr. Unk Pinto as scheduled  HPI: [name] is a 67 y.o.   who initially presented with complaints of postmenopausal bleeding.  A transvaginal ultrasound showed an endometrial stripe of 4 mm.  Sonohysterogram was attempted but was very limited and uncomfortable at that same visit and attempt was made with an endometrial biopsy and the pathology returned scant.  Attempted an endometrial biopsy was very painful. amounts of mucoid material and rare benign glandular epithelial cells nondiagnostic material.  The ultrasound at that time demonstrated the presence of a left ovarian mass measuring 10 x 8 mm irregular without flow no fluid in the cul-de-sac.  Uterus was noted to measure 4.4 cm in greatest length.  CA 125 was collected and returned with a value of 6.  Subsequently underwent laparoscopy for her gallbladder and the right adnexa was evaluated and appeared abnormal.  An image was obtained and is noted below.  Surgeon reported that the adnexa appeared fixed to the sidewall.   Family history is notable for breast cancer in her mother's sisters and also in the  first and second cousin.  Her grandmother was diagnosed with breast cancer at the age of 75 there is no family history of ovarian or gynecologic malignancies.  She reports a history of diverticulosis and an episode of diverticulitis treated with antibiotics.  Colonoscopy and EGD in February 2019 were within normal limits.  She underwent robotic assisted total laparoscopic hysterectomy bilateral salpingo-oophorectomy on 12/23/2017.  Final pathology without evidence of malignancy.  She was readmitted on 12/26/2017 with nausea and emesis.  CT imaging was unremarkable for the presence of perforation and she was subsequently manages a partial small bowel obstruction.  She was also diagnosed with C. difficile and treated.  She was discharged on 01/03/2018.  Pathology A: Uterus, cervix, bilateral fallopian tubes and ovaries, hysterectomy, bilateral salpingo-oophorectomy  Cervix: Atrophic with areas of erosion/healing ulceration, Nabothian cysts and tunnel clusters  Endometrium: Atrophic with areas of erosion  Myometrium:  - Submucosal adenomyoma, 0.8 cm  Bilateral ovaries:    Right ovary: Multiloculated serous cystadenofibroma, 2.5 cm, and tubo-ovarian adhesions   Left ovary: Cystic endosalpingiosis and scant adhesions   No borderline tumor or malignancy identified  Bilateral fallopian tubes:  - Two fimbriated fallopian tubes; left with small paratubal cysts, dystrophic calcifications and scant adhesions  Other findings: - Lipoleiomyoma involving adherent soft tissue at right cornu (1.5 cm) (see comment) - Vascular medial calcifications   Review of Systems: Constitutional  Feels well,   Cardiovascular  No chest pain, shortness of breath, or edema  Pulmonary  No cough or wheeze.  Gastro Intestinal  No nausea, vomitting, or diarrhoea. No bright  red blood per rectum, no abdominal pain, change in bowel movement, or constipation.  Reports bloating, appetite has improved since  cholecystectomy Genito Urinary  No frequency, urgency, dysuria, of limiting episode of vaginal bleeding Musculo Skeletal  No myalgia, arthralgia, joint swelling or pain  Neurologic  No weakness, numbness, change in gait,  Psychology  No depression, anxiety, insomnia.    Current Meds:  Outpatient Encounter Medications as of 02/04/2018  Medication Sig  . eletriptan (RELPAX) 20 MG tablet Take 1 tablet (20 mg total) by mouth as needed for migraine or headache. 1 po at onset of headache. May repeat in 2 hours if headache persists or recurs.  . fenofibrate (TRICOR) 145 MG tablet Take 1 tablet (145 mg total) by mouth daily.  Marland Kitchen lamoTRIgine (LAMICTAL) 100 MG tablet TAKE 1 TABLET BY MOUTH EVERY DAY  . methylphenidate (METADATE CD) 10 MG CR capsule Take 1 capsule 3 x/day for ADD  . ondansetron (ZOFRAN) 4 MG tablet Take 1 to 2 tablets 2 to 3 x / day if needed for Nausea  . rOPINIRole (REQUIP) 1 MG tablet 1/2- 2 pills at night for restless legs as needed  . DULoxetine (CYMBALTA) 60 MG capsule TAKE 1 CAPSULE (60 MG TOTAL) BY MOUTH 2 (TWO) TIMES DAILY. (Patient taking differently: Take 60 mg by mouth 2 (two) times daily. Patient states that she takes 90mg  daily.)   Facility-Administered Encounter Medications as of 02/04/2018  Medication  . ipratropium-albuterol (DUONEB) 0.5-2.5 (3) MG/3ML nebulizer solution 3 mL    Allergy:  Allergies  Allergen Reactions  . Erythromycin Other (See Comments)    TBS  . Ivp Dye [Iodinated Diagnostic Agents] Other (See Comments)    Hot flashes, itching scalp  . Penicillins Hives  . Clarithromycin Other (See Comments)    Itching ankles  . Brintellix [Vortioxetine]     Itching  . Macrodantin [Nitrofurantoin]     Social Hx:   Social History   Socioeconomic History  . Marital status: Divorced    Spouse name: Not on file  . Number of children: 2  . Years of education: 73  . Highest education level: Not on file  Occupational History    Employer: [employer]gallery [employer]    Comment: Flight Attendant  Social Needs  . Financial resource strain: Not on file  . Food insecurity:    Worry: Not on file    Inability: Not on file  . Transportation needs:    Medical: Not on file    Non-medical: Not on file  Tobacco Use  . Smoking status: Never Smoker  . Smokeless tobacco: Never Used  Substance and Sexual Activity  . Alcohol use: Yes    Alcohol/week: 0.6 oz    Types: 1 Shots of liquor per week    Comment: Once a year  . Drug use: No  . Sexual activity: Not on file  Lifestyle  . Physical activity:    Days per week: Not on file    Minutes per session: Not on file  . Stress: Not on file  Relationships  . Social connections:    Talks on phone: Not on file    Gets together: Not on file    Attends religious service: Not on file    Active member of club or organization: Not on file    Attends meetings of clubs or organizations: Not on file    Relationship status: Not on file  . Intimate partner violence:    Fear of current or ex partner: Not  on file    Emotionally abused: Not on file    Physically abused: Not on file    Forced sexual activity: Not on file  Other Topics Concern  . Not on file  Social History Narrative   Patient is single and lives at home alone. Patient is flight attendant. Patient has college education one year      Right handed.   Caffeine- Rare.     Past Surgical Hx:  Past Surgical History:  Procedure Laterality Date  . BREAST BIOPSY Right 02/27/2014   Stereo- Benign  . CHOLECYSTECTOMY  11/03/2017   Dr. Rosendo Gros  . kidney stone removed    . LAPAROSCOPIC HYSTERECTOMY  12/2017  . NASAL SINUS SURGERY    . perforated eardrum     left side  . TONSILLECTOMY AND ADENOIDECTOMY      Past Medical Hx:  Past Medical History:  Diagnosis Date  . Anemia   . Depression   . GERD (gastroesophageal reflux disease)   . Hay fever   . Head trauma   . IBS (irritable bowel syndrome)   . Memory loss   . Migraines   . OSA on  CPAP   . Reflux   . RLS (restless legs syndrome)   . Sinus congestion     Past Gynecological History: Gravida 3 para 2 no history of abnormal Pap tests postmenopausal since the age of 78 with one episode of abnormal uterine bleeding at age 67  Family Hx:  Family History  Problem Relation Age of Onset  . Diabetes Mother   . Diabetes Sister   . Breast cancer Maternal Grandmother 32  . Breast cancer Maternal Aunt 49  . Breast cancer Cousin     Vitals:  Blood pressure 110/81, pulse 93, temperature 97.6 F (36.4 C), temperature source Oral, resp. rate 20, height 5\' 7"  (1.702 m), weight 176 lb 1.6 oz (79.9 kg), SpO2 98 %.  Physical Exam: WD in NAD Neck  Supple NROM, without any enlargements.  Lymph Node Survey No cervical supraclavicular or inguinal adenopathy Skin  No rash/lesions/breakdown  Psychiatry  Alert and oriented appropriate mood affect speech and reasoning. Abdomen  Normoactive bowel sounds, abdomen soft, non-tender.  Laparoscopic port sites intact without any hernia tenderness or masses Back No CVA tenderness Genito Urinary  Vulva/vagina: Normal external female genitalia.  No lesions. No discharge or bleeding.    Vagina: Atropic, bruising noted at the apex, no masses no cul de sac nodularity.  Extremities  No bilateral cyanosis, clubbing or edema.   Janie Morning, MD, PhD 02/04/2018, 4:38 PM

## 2018-02-04 NOTE — Patient Instructions (Addendum)
No follow up scheduled with Dr Skeet Latch, you can follow up with your primary physician or OB/Gyn as needed.   If you need our office in the future, please call us at [phone].

## 2018-02-05 ENCOUNTER — Other Ambulatory Visit: Payer: Self-pay | Admitting: Internal Medicine

## 2018-02-05 ENCOUNTER — Encounter: Payer: Self-pay | Admitting: Internal Medicine

## 2018-02-05 DIAGNOSIS — F988 Other specified behavioral and emotional disorders with onset usually occurring in childhood and adolescence: Secondary | ICD-10-CM

## 2018-02-05 DIAGNOSIS — F319 Bipolar disorder, unspecified: Secondary | ICD-10-CM

## 2018-02-05 MED ORDER — METHYLPHENIDATE HCL 10 MG PO TABS: ORAL_TABLET | ORAL | 0 refills | Status: DC

## 2018-02-05 NOTE — Progress Notes (Signed)
I agree with the assessment and plan as directed by NP .The patient is known to me .   Terrin Meddaugh, MD  

## 2018-02-12 ENCOUNTER — Other Ambulatory Visit: Payer: Self-pay | Admitting: Internal Medicine

## 2018-02-12 DIAGNOSIS — K219 Gastro-esophageal reflux disease without esophagitis: Secondary | ICD-10-CM

## 2018-02-12 DIAGNOSIS — K5792 Diverticulitis of intestine, part unspecified, without perforation or abscess without bleeding: Secondary | ICD-10-CM

## 2018-03-15 NOTE — Progress Notes (Deleted)
Complete Physical  Assessment and Plan:   Encounter for general adult medical examination with abnormal findings  Hyperlipidemia -cont diet and exercise - Lipid panel   Hyperglycemia Discussed general issues about diabetes pathophysiology and management., Educational material distributed., Suggested low cholesterol diet., Encouraged aerobic exercise., Discussed foot care., Reminded to get yearly retinal exam.  Anemia, iron deficiency - monitor, continue iron supp with Vitamin C and increase green leafy veggies - Iron and TIBC - Vitamin B12   Essential hypertension - continue medications, DASH diet, exercise and monitor at home. Call if greater than 130/80.  - Urinalysis, Routine w reflex microscopic (not at [facility]) - Microalbumin / creatinine urine ratio - EKG 12-Lead - TSH   Vitamin D deficiency - VITAMIN D 25 Hydroxy (Vit-D Deficiency, Fractures)  ADD (attention deficit disorder) - methylphenidate (METADATE CD) 10 MG CR capsule; Take 1 capsule (10 mg total) by mouth 3 (three) times daily as needed.  Dispense: 90 capsule; Refill: 0  OSA on CPAP - not using due to not having mask/tubes, needs equipment *** Has fatigue, frequent awakening, needs to get back on it -     For home use only DME continuous positive airway pressure (CPAP)  Extrinsic asthma without complication, unspecified asthma severity, unspecified whether persistent Avoid triggers  Gastroesophageal reflux disease, esophagitis presence not specified Continue PPI/H2 blocker, diet discussed  RLS (restless legs syndrome) Check iron, magnesium, increase walking  Bipolar depression ([place]) Continue medications  Discussed med's effects and SE's. Screening labs and tests as requested with regular follow-up as recommended. Future Appointments  Date Time Provider [place]  03/16/2018  9:00 AM Liane Comber, NP [facility] None  08/05/2018  8:15 AM Dennie Bible, NP [facility] None     HPI  67  y.o. female  presents for a complete physical. She has Mixed hyperlipidemia; ADD (attention deficit disorder); RLS (restless legs syndrome); OSA on CPAP; Bipolar depression ([place]); Elevated BP without diagnosis of hypertension; Vitamin D deficiency; GERD ; Extrinsic asthma; Medication management; BMI 27.0-27.9,adult; Anemia; Migraines; Excessive daytime sleepiness; Circadian rhythm sleep disorder; Periodic limb movement disorder (PLMD); and Fatty liver on their problem list. This year she experienced self-limiting uterine bleeding, workup by GYN demonstrated an endometrial stripe of 4 mm, an ultrasound that showed a 10 mm irregular cyst and laparoscopic findings notable for an adnexa with excrescences.  CA125 was 6.  She underwent hysterectomy bilateral salpingo-oophorectomy and a minimally invasive fashion on Dec 23, 2017.    She is on lamictal and ADD medication for bipolar depression.    BMI is There is no height or weight on file to calculate BMI., she {HAS HAS [id number]} been working on diet and exercise. She has OSA sleep study 3 years ago ***  Wt Readings from Last 3 Encounters:  02/04/18 176 lb 1.6 oz (79.9 kg)  01/28/18 176 lb 3.2 oz (79.9 kg)  01/27/18 176 lb 12.8 oz (80.2 kg)   Her blood pressure has been controlled at home, today their BP is  .   She does not workout. She denies chest pain, shortness of breath, dizziness.   She is on cholesterol medication (fenofibrate) and denies myalgias. She was on pravastatin previously and reported arthralgias that resolved with cessation.  Her cholesterol is not at goal. The cholesterol last visit was:  Lab Results  Component Value Date   CHOL 228 (H) 08/26/2017   HDL 37 (L) 08/26/2017   LDLCALC  08/26/2017     Comment:     . LDL cholesterol  not calculated. Triglyceride levels greater than 400 mg/dL invalidate calculated LDL results. . Reference range: <100 . Desirable range <100 mg/dL for primary prevention;   <70 mg/dL for patients  with CHD or diabetic patients  with > or = 2 CHD risk factors. Marland Kitchen LDL-C is now calculated using the Martin-Hopkins  calculation, which is a validated novel method providing  better accuracy than the Friedewald equation in the  estimation of LDL-C.  Cresenciano Genre et al. Annamaria Helling. 9977;414(23): 2061-2068  (http://education.[employer].com/faq/FAQ164)    TRIG 623 (H) 08/26/2017   CHOLHDL 6.2 (H) 08/26/2017  . She has been working on diet and exercise for prediabetes, she is not on bASA, she is on ACE/ARB and denies foot ulcerations, hyperglycemia, hypoglycemia , increased appetite, nausea, paresthesia of the feet, polydipsia, polyuria, visual disturbances, vomiting and weight loss. Last A1C in the office was:  Lab Results  Component Value Date   HGBA1C 5.3 06/12/2016   Patient is on Vitamin D supplement.   Lab Results  Component Value Date   VD25OH 35 05/13/2017     She takes ranitidine daily for reflux She has RLS, last iron was normal, b12 normal, mag and CPK normal Lab Results  Component Value Date   IRON 61 09/23/2017   TIBC 432 09/23/2017   FERRITIN 77 07/28/2017     Current Medications:  Current Outpatient Medications on File Prior to Visit  Medication Sig  . DULoxetine (CYMBALTA) 60 MG capsule TAKE 1 CAPSULE (60 MG TOTAL) BY MOUTH 2 (TWO) TIMES DAILY. (Patient taking differently: Take 60 mg by mouth 2 (two) times daily. Patient states that she takes 90mg  daily.)  . eletriptan (RELPAX) 20 MG tablet Take 1 tablet (20 mg total) by mouth as needed for migraine or headache. 1 po at onset of headache. May repeat in 2 hours if headache persists or recurs.  . fenofibrate (TRICOR) 145 MG tablet Take 1 tablet (145 mg total) by mouth daily.  Marland Kitchen lamoTRIgine (LAMICTAL) 100 MG tablet TAKE 1 TABLET BY MOUTH EVERY DAY  . methylphenidate (RITALIN) 10 MG tablet Take 1 tablet 3 x/ day for ADD & Mood  . ondansetron (ZOFRAN) 4 MG tablet Take 1 to 2 tablets 2 to 3 x / day if needed for Nausea  .  ranitidine (ZANTAC) 300 MG tablet TAKE 1 TABLET BY MOUTH EVERY NIGHT FOR ACID REFLUX  . rOPINIRole (REQUIP) 1 MG tablet 1/2- 2 pills at night for restless legs as needed   Current Facility-Administered Medications on File Prior to Visit  Medication  . ipratropium-albuterol (DUONEB) 0.5-2.5 (3) MG/3ML nebulizer solution 3 mL    Health Maintenance:   Immunization History  Administered Date(s) Administered  . Influenza, High Dose Seasonal PF 06/12/2016, 05/13/2017  . Influenza-Unspecified 06/02/2015  . PPD Test 08/19/2011, 08/30/2013, 11/09/2014  . Pneumococcal Conjugate-13 08/18/2009  . Pneumococcal Polysaccharide-23 06/12/2016  . Td 08/19/2003  . Tdap 08/30/2013  . Zoster 08/18/2010    Tetanus: 2015 Pneumovax: 2017 Prevnar: 2011 Flu vaccine: 2017 Zostavax: 2012  PAP: 2015, s/p TAH MGM: 12/2017 DEXA: 2015 - osteopenia Colonoscopy: 2013 EGD: 2017  Last Dental Exam: Dr. Tama Gander, twice yearly Last Eye Exam: Dr. Marica Otter, yearly  Patient Care Team: Unk Pinto, MD as PCP - General (Internal Medicine) Clarene Essex, MD as Consulting Physician (Gastroenterology) Royston Sinner Colin Benton, MD as Consulting Physician (Obstetrics and Gynecology)   Medical History:  Past Medical History:  Diagnosis Date  . Anemia   . Depression   . GERD (gastroesophageal reflux disease)   .  Hay fever   . Head trauma   . IBS (irritable bowel syndrome)   . Memory loss   . Migraines   . OSA on CPAP   . Reflux   . RLS (restless legs syndrome)   . Sinus congestion     Allergies Allergies  Allergen Reactions  . Erythromycin Other (See Comments)    TBS  . Ivp Dye [Iodinated Diagnostic Agents] Other (See Comments)    Hot flashes, itching scalp  . Penicillins Hives  . Clarithromycin Other (See Comments)    Itching ankles  . Brintellix [Vortioxetine]     Itching  . Macrodantin [Nitrofurantoin]     SURGICAL HISTORY She  has a past surgical history that includes perforated  eardrum; Nasal sinus surgery; Tonsillectomy and adenoidectomy; kidney stone removed; Breast biopsy (Right, 02/27/2014); Cholecystectomy (11/03/2017); and Laparoscopic hysterectomy (12/2017). FAMILY HISTORY Her family history includes Breast cancer in her cousin; Breast cancer (age of onset: 32) in her maternal aunt; Breast cancer (age of onset: 39) in her maternal grandmother; Diabetes in her mother and sister. SOCIAL HISTORY She  reports that she has never smoked. She has never used smokeless tobacco. She reports that she drinks about 0.6 oz of alcohol per week. She reports that she does not use drugs.  Review of Systems: Review of Systems  Constitutional: Negative for chills, fever, malaise/fatigue and weight loss.  HENT: Negative for congestion, ear pain, hearing loss, sore throat and tinnitus.   Eyes: Negative for blurred vision and double vision.  Respiratory: Negative for cough, sputum production, shortness of breath and wheezing.   Cardiovascular: Negative for chest pain, palpitations, orthopnea, claudication, leg swelling and PND.  Gastrointestinal: Negative for abdominal pain, blood in stool, constipation, diarrhea, heartburn, melena, nausea and vomiting.  Genitourinary: Negative.   Musculoskeletal: Negative for falls, joint pain and myalgias.  Skin: Negative for rash.  Neurological: Negative for dizziness, tingling, sensory change, loss of consciousness, weakness and headaches (occasional migraines).  Endo/Heme/Allergies: Negative for polydipsia.  Psychiatric/Behavioral: Negative for depression, memory loss, substance abuse and suicidal ideas. The patient is not nervous/anxious and does not have insomnia.   All other systems reviewed and are negative.   Physical Exam: Estimated body mass index is 27.58 kg/m as calculated from the following:   Height as of 02/04/18: 5\' 7"  (1.702 m).   Weight as of 02/04/18: 176 lb 1.6 oz (79.9 kg). There were no vitals taken for this visit.  Wt  Readings from Last 3 Encounters:  02/04/18 176 lb 1.6 oz (79.9 kg)  01/28/18 176 lb 3.2 oz (79.9 kg)  01/27/18 176 lb 12.8 oz (80.2 kg)    General Appearance: Well nourished well developed, in no apparent distress.  Eyes: PERRLA, EOMs, conjunctiva no swelling or erythema ENT/Mouth: Ear canals normal without obstruction, swelling, erythema, or discharge.  TMs normal bilaterally with no erythema, bulging, retraction, or loss of landmark.  Oropharynx moist and clear with no exudate, erythema, or swelling.   Neck: Supple, thyroid normal. No bruits.  No cervical adenopathy Respiratory: Respiratory effort normal, Breath sounds clear A&P without wheeze, rhonchi, rales.   Cardio: RRR without murmurs, rubs or gallops. Brisk peripheral pulses without edema.  Chest: symmetric, with normal excursions Breasts: Symmetric, without lumps, nipple discharge, retractions.  Abdomen: Soft, nontender, no guarding, rebound, hernias, masses, or organomegaly.  Lymphatics: Non tender without lymphadenopathy.   Musculoskeletal: Full ROM all peripheral extremities,5/5 strength, and normal gait.  Skin: Warm, dry without rashes, lesions, ecchymosis. Neuro: Awake and oriented X 3, Cranial  nerves intact, reflexes equal bilaterally. Normal muscle tone, no cerebellar symptoms. Sensation intact.  Psych:  normal affect, Insight and Judgment appropriate.   EKG: WNL no changes.  Over 40 minutes of exam, counseling, chart review and critical decision making was performed  [place] 8:16 AM [facility]

## 2018-03-16 ENCOUNTER — Encounter: Payer: Self-pay | Admitting: Adult Health

## 2018-03-26 ENCOUNTER — Other Ambulatory Visit: Payer: Self-pay | Admitting: Internal Medicine

## 2018-03-26 DIAGNOSIS — F319 Bipolar disorder, unspecified: Secondary | ICD-10-CM

## 2018-03-26 DIAGNOSIS — F988 Other specified behavioral and emotional disorders with onset usually occurring in childhood and adolescence: Secondary | ICD-10-CM

## 2018-03-26 MED ORDER — METHYLPHENIDATE HCL 10 MG PO TABS: ORAL_TABLET | ORAL | 0 refills | Status: DC

## 2018-04-05 NOTE — Progress Notes (Signed)
Complete Physical  Assessment and Plan:   Encounter for general adult medical examination with abnormal findings  Hyperlipidemia -cont diet and exercise - Lipid panel   Hyperglycemia Discussed general issues about diabetes pathophysiology and management., Educational material distributed., Suggested low cholesterol diet., Encouraged aerobic exercise., Discussed foot care., Reminded to get yearly retinal exam.  Anemia, iron deficiency - monitor, continue iron supp with Vitamin C and increase green leafy veggies - Iron and TIBC - Vitamin B12   Essential hypertension - continue medications, DASH diet, exercise and monitor at home. Call if greater than 130/80.  - Urinalysis, Routine w reflex microscopic (not at [facility]) - Microalbumin / creatinine urine ratio - EKG 12-Lead - TSH   Vitamin D deficiency - VITAMIN D 25 Hydroxy (Vit-D Deficiency, Fractures)  Osteopenia - get dexa, continue Vit D and Ca, weight bearing exercises  ADD (attention deficit disorder) - methylphenidate (METADATE CD) 10 MG CR capsule; Take 1 capsule (10 mg total) by mouth 3 (three) times daily as needed.  Dispense: 90 capsule; Refill: 0  OSA on CPAP Endorses 100 % compliance  Extrinsic asthma without complication, unspecified asthma severity, unspecified whether persistent Avoid triggers  Gastroesophageal reflux disease, esophagitis presence not specified Continue PPI/H2 blocker, diet discussed  RLS (restless legs syndrome) Check iron, magnesium, increase walking Increase requip, if not getting better can try going back to neuro  Bipolar depression ([place]) Continue medications  Vaginal discharge ? Yeast, wet prep  Overweight Long discussion about weight loss, diet, and exercise Recommended diet heavy in fruits and veggies and low in animal meats, cheeses, and dairy products, appropriate calorie intake Discussed appropriate weight for height  Follow up at next visit    Discussed med's effects  and SE's. Screening labs and tests as requested with regular follow-up as recommended. Future Appointments  Date Time Provider [place]  08/05/2018  8:15 AM Kimberly Bible, NP [facility] None     HPI  67 y.o. female  presents for a complete physical. She has Mixed hyperlipidemia; ADD (attention deficit disorder); RLS (restless legs syndrome); OSA on CPAP; Bipolar depression ([place]); Elevated BP without diagnosis of hypertension; Abnormal glucose; Vitamin D deficiency; GERD ; Extrinsic asthma; Medication management; Overweight (BMI 25.0-29.9); Anemia; Migraines; Excessive daytime sleepiness; Circadian rhythm sleep disorder; Periodic limb movement disorder (PLMD); and Fatty liver on their problem list. This year she experienced self-limiting uterine bleeding, workup by GYN demonstrated an endometrial stripe of 4 mm, an ultrasound that showed a 10 mm irregular cyst and laparoscopic findings notable for an adnexa with excrescences.  CA125 was 6.  She underwent hysterectomy bilateral salpingo-oophorectomy and a minimally invasive fashion on Dec 23, 2017.   She is on lamictal and ADD medication for bipolar depression.   BMI is Body mass index is 27.72 kg/m., she has not been working on diet and exercise. She has OSA sleep study 3 years ago, now on CPAP.   She does have a lot of trouble with periodic limb movement/restless legs, feels like requip is not working well. She has moved recently, has been working more, under some stress.   Wt Readings from Last 3 Encounters:  04/06/18 177 lb (80.3 kg)  02/04/18 176 lb 1.6 oz (79.9 kg)  01/28/18 176 lb 3.2 oz (79.9 kg)   Her blood pressure has been controlled at home, today their BP is BP: 110/80.   She does not workout. She denies chest pain, shortness of breath, dizziness.   She is on cholesterol medication (fenofibrate) and denies myalgias.  She was on pravastatin previously and reported arthralgias that resolved with cessation.  Her  cholesterol is not at goal. The cholesterol last visit was:  Lab Results  Component Value Date   CHOL 228 (H) 08/26/2017   HDL 37 (L) 08/26/2017   LDLCALC  08/26/2017     Comment:     . LDL cholesterol not calculated. Triglyceride levels greater than 400 mg/dL invalidate calculated LDL results. . Reference range: <100 . Desirable range <100 mg/dL for primary prevention;   <70 mg/dL for patients with CHD or diabetic patients  with > or = 2 CHD risk factors. Marland Kitchen LDL-C is now calculated using the Martin-Hopkins  calculation, which is a validated novel method providing  better accuracy than the Friedewald equation in the  estimation of LDL-C.  Kimberly Ray et al. Kimberly Ray. 7124;580(99): 2061-2068  (http://education.[employer].com/faq/FAQ164)    TRIG 623 (H) 08/26/2017   CHOLHDL 6.2 (H) 08/26/2017  . She has been working on diet and exercise for glucose management, Last A1C in the office was:  Lab Results  Component Value Date   HGBA1C 5.3 06/12/2016   Patient is on Vitamin D supplement.   Lab Results  Component Value Date   VD25OH 35 05/13/2017     She takes ranitidine daily for reflux She has RLS, last iron was normal, b12 normal, mag and CPK normal Lab Results  Component Value Date   IRON 61 09/23/2017   TIBC 432 09/23/2017   FERRITIN 77 07/28/2017     Current Medications:  Current Outpatient Medications on File Prior to Visit  Medication Sig  . DULoxetine (CYMBALTA) 60 MG capsule TAKE 1 CAPSULE (60 MG TOTAL) BY MOUTH 2 (TWO) TIMES DAILY. (Patient taking differently: Take 60 mg by mouth 2 (two) times daily. Patient states that she takes 90mg  daily.)  . eletriptan (RELPAX) 20 MG tablet Take 1 tablet (20 mg total) by mouth as needed for migraine or headache. 1 po at onset of headache. May repeat in 2 hours if headache persists or recurs.  . fenofibrate (TRICOR) 145 MG tablet Take 1 tablet (145 mg total) by mouth daily.  Marland Kitchen lamoTRIgine (LAMICTAL) 100 MG tablet TAKE 1  TABLET BY MOUTH EVERY DAY  . methylphenidate (RITALIN) 10 MG tablet Take 1 tablet 1 to 2  x/ day for ADD & Mood  . ondansetron (ZOFRAN) 4 MG tablet Take 1 to 2 tablets 2 to 3 x / day if needed for Nausea  . ranitidine (ZANTAC) 300 MG tablet TAKE 1 TABLET BY MOUTH EVERY NIGHT FOR ACID REFLUX (Patient taking differently: TAKE 1 TABLET BY MOUTH EVERY NIGHT FOR ACID REFLUX AS NEEDED)  . rOPINIRole (REQUIP) 1 MG tablet 1/2- 2 pills at night for restless legs as needed   No current facility-administered medications on file prior to visit.     Health Maintenance:   Immunization History  Administered Date(s) Administered  . Influenza, High Dose Seasonal PF 06/12/2016, 05/13/2017  . Influenza-Unspecified 06/02/2015  . PPD Test 08/19/2011, 08/30/2013, 11/09/2014  . Pneumococcal Conjugate-13 08/18/2009  . Pneumococcal Polysaccharide-23 06/12/2016  . Td 08/19/2003  . Tdap 08/30/2013  . Zoster 08/18/2010    Tetanus: 2015 Pneumovax: 2017 Prevnar: 2011 Flu vaccine: 2018 Zostavax: 2012  PAP: 2019, s/p TAH MGM: 12/2017 DEXA: 2015 - osteopenia Colonoscopy: 09/2011 EGD: 2019  Last Dental Exam: Dr. Tama Gander, twice yearly  Last Eye Exam: Dr. Trish Fountain office, yearly Last Derm Exam: few years ago  Patient Care Team: Unk Pinto, MD as PCP - General (Internal  Medicine) Clarene Essex, MD as Consulting Physician (Gastroenterology) Royston Sinner Colin Benton, MD as Consulting Physician (Obstetrics and Gynecology)   Medical History:  Past Medical History:  Diagnosis Date  . Anemia   . Depression   . GERD (gastroesophageal reflux disease)   . Hay fever   . Head trauma   . IBS (irritable bowel syndrome)   . Memory loss   . Migraines   . OSA on CPAP   . Reflux   . RLS (restless legs syndrome)   . Sinus congestion     Allergies Allergies  Allergen Reactions  . Erythromycin Other (See Comments)    TBS  . Ivp Dye [Iodinated Diagnostic Agents] Other (See Comments)    Hot flashes, itching  scalp  . Penicillins Hives  . Clarithromycin Other (See Comments)    Itching ankles  . Brintellix [Vortioxetine]     Itching  . Macrodantin [Nitrofurantoin]     SURGICAL HISTORY She  has a past surgical history that includes perforated eardrum; Nasal sinus surgery; Tonsillectomy and adenoidectomy; Breast biopsy (Right, 02/27/2014); Cholecystectomy (11/03/2017); Laparoscopic hysterectomy (12/2017); and Ureteroscopy. FAMILY HISTORY Her family history includes Breast cancer in her cousin; Breast cancer (age of onset: 70) in her maternal aunt; Breast cancer (age of onset: 37) in her maternal grandmother; Colon cancer (age of onset: 47) in her paternal grandmother; Diabetes in her mother and sister; Heart failure in her father; Heart failure (age of onset: 69) in her maternal grandfather; Osteoporosis in her brother; Stomach cancer in her maternal aunt. SOCIAL HISTORY She  reports that she has never smoked. She has never used smokeless tobacco. She reports that she drinks about 1.0 standard drinks of alcohol per week. She reports that she does not use drugs.  Review of Systems: Review of Systems  Constitutional: Negative for chills, fever, malaise/fatigue and weight loss.  HENT: Negative for congestion, ear pain, hearing loss, sore throat and tinnitus.   Eyes: Negative for blurred vision and double vision.  Respiratory: Negative for cough, sputum production, shortness of breath and wheezing.   Cardiovascular: Negative for chest pain, palpitations, orthopnea, claudication, leg swelling and PND.  Gastrointestinal: Negative for abdominal pain, blood in stool, constipation, diarrhea, heartburn, melena, nausea and vomiting.  Genitourinary: Negative.        White vaginal discharge since surgery, ? Yeast  Musculoskeletal: Negative for falls, joint pain and myalgias.  Skin: Negative for rash.  Neurological: Negative for dizziness, tingling, sensory change, loss of consciousness, weakness and headaches  (occasional migraines).  Endo/Heme/Allergies: Negative for polydipsia.  Psychiatric/Behavioral: Negative for depression, memory loss, substance abuse and suicidal ideas. The patient is not nervous/anxious and does not have insomnia.   All other systems reviewed and are negative.   Physical Exam: Estimated body mass index is 27.72 kg/m as calculated from the following:   Height as of this encounter: 5\' 7"  (1.702 m).   Weight as of this encounter: 177 lb (80.3 kg). BP 110/80   Pulse 81   Temp (!) 97.2 F (36.2 C)   Ht 5\' 7"  (1.702 m)   Wt 177 lb (80.3 kg)   SpO2 98%   BMI 27.72 kg/m   Wt Readings from Last 3 Encounters:  04/06/18 177 lb (80.3 kg)  02/04/18 176 lb 1.6 oz (79.9 kg)  01/28/18 176 lb 3.2 oz (79.9 kg)    General Appearance: Well nourished well developed, in no apparent distress.  Eyes: PERRLA, EOMs, conjunctiva no swelling or erythema ENT/Mouth: Ear canals normal without obstruction, swelling, erythema,  or discharge.  TMs normal bilaterally with no erythema, bulging, retraction, or loss of landmark.  Oropharynx moist and clear with no exudate, erythema, or swelling.   Neck: Supple, thyroid normal. No bruits.  No cervical adenopathy Respiratory: Respiratory effort normal, Breath sounds clear A&P without wheeze, rhonchi, rales.   Cardio: RRR without murmurs, rubs or gallops. Brisk peripheral pulses without edema.  Chest: symmetric, with normal excursions Breasts: Symmetric, without lumps, nipple discharge, retractions.  Abdomen: Soft, nontender, no guarding, rebound, hernias, masses, or organomegaly.  Lymphatics: Non tender without lymphadenopathy.   Musculoskeletal: Full ROM all peripheral extremities,5/5 strength, and normal gait.  Skin: Warm, dry without rashes, lesions, ecchymosis. GU: defer Neuro: Awake and oriented X 3, Cranial nerves intact, reflexes equal bilaterally. Normal muscle tone, no cerebellar symptoms. Sensation intact.  Psych:  normal affect, Insight  and Judgment appropriate.   EKG: WNL no changes.  Over 40 minutes of exam, counseling, chart review and critical decision making was performed  Izora Ribas 10:28 AM [facility]

## 2018-04-06 ENCOUNTER — Ambulatory Visit: Payer: BLUE CROSS/BLUE SHIELD | Admitting: Adult Health

## 2018-04-06 ENCOUNTER — Encounter: Payer: Self-pay | Admitting: Adult Health

## 2018-04-06 VITALS — BP 110/80 | HR 81 | Temp 97.2°F | Ht 67.0 in | Wt 177.0 lb

## 2018-04-06 DIAGNOSIS — F988 Other specified behavioral and emotional disorders with onset usually occurring in childhood and adolescence: Secondary | ICD-10-CM

## 2018-04-06 DIAGNOSIS — Z79899 Other long term (current) drug therapy: Secondary | ICD-10-CM | POA: Diagnosis not present

## 2018-04-06 DIAGNOSIS — G4719 Other hypersomnia: Secondary | ICD-10-CM

## 2018-04-06 DIAGNOSIS — E559 Vitamin D deficiency, unspecified: Secondary | ICD-10-CM | POA: Diagnosis not present

## 2018-04-06 DIAGNOSIS — N898 Other specified noninflammatory disorders of vagina: Secondary | ICD-10-CM

## 2018-04-06 DIAGNOSIS — Z130 Encounter for screening for diseases of the blood and blood-forming organs and certain disorders involving the immune mechanism: Secondary | ICD-10-CM | POA: Diagnosis not present

## 2018-04-06 DIAGNOSIS — Z0001 Encounter for general adult medical examination with abnormal findings: Secondary | ICD-10-CM

## 2018-04-06 DIAGNOSIS — Z0000 Encounter for general adult medical examination without abnormal findings: Secondary | ICD-10-CM | POA: Diagnosis not present

## 2018-04-06 DIAGNOSIS — K219 Gastro-esophageal reflux disease without esophagitis: Secondary | ICD-10-CM

## 2018-04-06 DIAGNOSIS — R030 Elevated blood-pressure reading, without diagnosis of hypertension: Secondary | ICD-10-CM

## 2018-04-06 DIAGNOSIS — G2581 Restless legs syndrome: Secondary | ICD-10-CM

## 2018-04-06 DIAGNOSIS — G43809 Other migraine, not intractable, without status migrainosus: Secondary | ICD-10-CM

## 2018-04-06 DIAGNOSIS — Z1329 Encounter for screening for other suspected endocrine disorder: Secondary | ICD-10-CM | POA: Diagnosis not present

## 2018-04-06 DIAGNOSIS — R7309 Other abnormal glucose: Secondary | ICD-10-CM

## 2018-04-06 DIAGNOSIS — E663 Overweight: Secondary | ICD-10-CM

## 2018-04-06 DIAGNOSIS — Z1211 Encounter for screening for malignant neoplasm of colon: Secondary | ICD-10-CM

## 2018-04-06 DIAGNOSIS — J45909 Unspecified asthma, uncomplicated: Secondary | ICD-10-CM

## 2018-04-06 DIAGNOSIS — Z113 Encounter for screening for infections with a predominantly sexual mode of transmission: Secondary | ICD-10-CM | POA: Diagnosis not present

## 2018-04-06 DIAGNOSIS — K760 Fatty (change of) liver, not elsewhere classified: Secondary | ICD-10-CM

## 2018-04-06 DIAGNOSIS — G4720 Circadian rhythm sleep disorder, unspecified type: Secondary | ICD-10-CM

## 2018-04-06 DIAGNOSIS — G4733 Obstructive sleep apnea (adult) (pediatric): Secondary | ICD-10-CM

## 2018-04-06 DIAGNOSIS — M8580 Other specified disorders of bone density and structure, unspecified site: Secondary | ICD-10-CM | POA: Insufficient documentation

## 2018-04-06 DIAGNOSIS — Z136 Encounter for screening for cardiovascular disorders: Secondary | ICD-10-CM | POA: Diagnosis not present

## 2018-04-06 DIAGNOSIS — F319 Bipolar disorder, unspecified: Secondary | ICD-10-CM

## 2018-04-06 DIAGNOSIS — Z13220 Encounter for screening for lipoid disorders: Secondary | ICD-10-CM | POA: Diagnosis not present

## 2018-04-06 DIAGNOSIS — E782 Mixed hyperlipidemia: Secondary | ICD-10-CM

## 2018-04-06 DIAGNOSIS — Z131 Encounter for screening for diabetes mellitus: Secondary | ICD-10-CM | POA: Diagnosis not present

## 2018-04-06 DIAGNOSIS — Z9989 Dependence on other enabling machines and devices: Secondary | ICD-10-CM

## 2018-04-06 DIAGNOSIS — G4761 Periodic limb movement disorder: Secondary | ICD-10-CM

## 2018-04-06 DIAGNOSIS — D649 Anemia, unspecified: Secondary | ICD-10-CM

## 2018-04-06 DIAGNOSIS — Z1389 Encounter for screening for other disorder: Secondary | ICD-10-CM | POA: Diagnosis not present

## 2018-04-06 NOTE — Patient Instructions (Addendum)
   Kimberly Ray , Thank you for taking time to come for your Complete Annual Physical. I appreciate your ongoing commitment to your health goals. Please review the following plan we discussed and let me know if I can assist you in the future.   These are the goals we discussed: Goals    . Weight (lb) < 160 lb (72.6 kg)       This is a list of the screening recommended for you and due dates:  Health Maintenance  Topic Date Due  . Flu Shot  05/18/2018*  . Urine Protein Check  09/23/2018  . Mammogram  01/06/2020  . Colon Cancer Screening  10/12/2021  . Tetanus Vaccine  08/31/2023  . DEXA scan (bone density measurement)  Completed  .  Hepatitis C: One time screening is recommended by Center for Disease Control  (CDC) for  adults born from [birth year] through 1966.   Completed  . Pneumonia vaccines  Completed  *Topic was postponed. The date shown is not the original due date.    Know what a healthy weight is for you (roughly BMI <25) and aim to maintain this  Aim for 7+ servings of fruits and vegetables daily  65-80+ fluid ounces of water or unsweet tea for healthy kidneys  Limit to max 1 drink of alcohol per day; avoid smoking/tobacco  Limit animal fats in diet for cholesterol and heart health - choose grass fed whenever available  Avoid highly processed foods, and foods high in saturated/trans fats  Aim for low stress - take time to unwind and care for your mental health  Aim for 150 min of moderate intensity exercise weekly for heart health, and weights twice weekly for bone health  Aim for 7-9 hours of sleep daily      When it comes to diets, agreement about the perfect plan isn't easy to find, even among the experts. Experts at the [employer] developed an idea known as the Healthy Eating Plate. Just imagine a plate divided into logical, healthy portions.  The emphasis is on diet quality:  Load up on vegetables and fruits - one-half of your plate: Aim for  color and variety, and remember that potatoes don't count.  Go for whole grains - one-quarter of your plate: Whole wheat, barley, wheat berries, quinoa, oats, brown rice, and foods made with them. If you want pasta, go with whole wheat pasta.  Protein power - one-quarter of your plate: Fish, chicken, beans, and nuts are all healthy, versatile protein sources. Limit red meat.  The diet, however, does go beyond the plate, offering a few other suggestions.  Use healthy plant oils, such as olive, canola, soy, corn, sunflower and peanut. Check the labels, and avoid partially hydrogenated oil, which have unhealthy trans fats.  If you're thirsty, drink water. Coffee and tea are good in moderation, but skip sugary drinks and limit milk and dairy products to one or two daily servings.  The type of carbohydrate in the diet is more important than the amount. Some sources of carbohydrates, such as vegetables, fruits, whole grains, and beans-are healthier than others.  Finally, stay active.

## 2018-04-07 LAB — WET PREP BY MOLECULAR PROBE
Candida species: NOT DETECTED
Gardnerella vaginalis: NOT DETECTED
MICRO NUMBER: 90992312
SPECIMEN QUALITY: ADEQUATE
Trichomonas vaginosis: NOT DETECTED

## 2018-04-07 LAB — IRON, TOTAL/TOTAL IRON BINDING CAP
%SAT: 18 % (calc) (ref 16–45)
IRON: 94 ug/dL (ref 45–160)
TIBC: 530 mcg/dL (calc) — ABNORMAL HIGH (ref 250–450)

## 2018-04-07 LAB — VITAMIN B12: VITAMIN B 12: 611 pg/mL (ref 200–1100)

## 2018-04-08 LAB — HEMOGLOBIN A1C
EAG (MMOL/L): 7 (calc)
HEMOGLOBIN A1C: 6 %{Hb} — AB (ref <5.7)
Mean Plasma Glucose: 126 (calc)

## 2018-04-08 LAB — MICROALBUMIN / CREATININE URINE RATIO
Creatinine, Urine: 100 mg/dL (ref 20–275)
Microalb Creat Ratio: 5 mcg/mg creat (ref <30)
Microalb, Ur: 0.5 mg/dL

## 2018-04-08 LAB — URINALYSIS W MICROSCOPIC + REFLEX CULTURE
BILIRUBIN URINE: NEGATIVE
Bacteria, UA: NONE SEEN /HPF
GLUCOSE, UA: NEGATIVE
Hgb urine dipstick: NEGATIVE
Hyaline Cast: NONE SEEN /LPF
KETONES UR: NEGATIVE
NITRITES URINE, INITIAL: NEGATIVE
PROTEIN: NEGATIVE
RBC / HPF: NONE SEEN /HPF (ref 0–2)
SQUAMOUS EPITHELIAL / LPF: NONE SEEN /HPF (ref <=5)
Specific Gravity, Urine: 1.02 (ref 1.001–1.03)
pH: 5.5 (ref 5.0–8.0)

## 2018-04-08 LAB — CBC WITH DIFFERENTIAL/PLATELET
BASOS ABS: 60 {cells}/uL (ref 0–200)
BASOS PCT: 0.7 %
EOS ABS: 153 {cells}/uL (ref 15–500)
Eosinophils Relative: 1.8 %
HEMATOCRIT: 42.3 % (ref 35.0–45.0)
Hemoglobin: 13.5 g/dL (ref 11.7–15.5)
Lymphs Abs: 1709 cells/uL (ref 850–3900)
MCH: 26.7 pg — AB (ref 27.0–33.0)
MCHC: 31.9 g/dL — AB (ref 32.0–36.0)
MCV: 83.6 fL (ref 80.0–100.0)
MONOS PCT: 6.5 %
MPV: 9.4 fL (ref 7.5–12.5)
Neutro Abs: 6027 cells/uL (ref 1500–7800)
Neutrophils Relative %: 70.9 %
Platelets: 432 10*3/uL — ABNORMAL HIGH (ref 140–400)
RBC: 5.06 10*6/uL (ref 3.80–5.10)
RDW: 13.4 % (ref 11.0–15.0)
Total Lymphocyte: 20.1 %
WBC: 8.5 10*3/uL (ref 3.8–10.8)
WBCMIX: 553 {cells}/uL (ref 200–950)

## 2018-04-08 LAB — VITAMIN D 25 HYDROXY (VIT D DEFICIENCY, FRACTURES): Vit D, 25-Hydroxy: 51 ng/mL (ref 30–100)

## 2018-04-08 LAB — LIPID PANEL
Cholesterol: 188 mg/dL (ref <200)
HDL: 45 mg/dL — ABNORMAL LOW (ref >50)
LDL Cholesterol (Calc): 98 mg/dL (calc)
NON-HDL CHOLESTEROL (CALC): 143 mg/dL — AB (ref <130)
TRIGLYCERIDES: 334 mg/dL — AB (ref <150)
Total CHOL/HDL Ratio: 4.2 (calc) (ref <5.0)

## 2018-04-08 LAB — COMPLETE METABOLIC PANEL WITH GFR
AG RATIO: 1.9 (calc) (ref 1.0–2.5)
ALBUMIN MSPROF: 4.7 g/dL (ref 3.6–5.1)
ALKALINE PHOSPHATASE (APISO): 71 U/L (ref 33–130)
ALT: 24 U/L (ref 6–29)
AST: 36 U/L — ABNORMAL HIGH (ref 10–35)
BILIRUBIN TOTAL: 0.4 mg/dL (ref 0.2–1.2)
BUN: 24 mg/dL (ref 7–25)
CHLORIDE: 102 mmol/L (ref 98–110)
CO2: 26 mmol/L (ref 20–32)
Calcium: 10.1 mg/dL (ref 8.6–10.4)
Creat: 0.99 mg/dL (ref 0.50–0.99)
GFR, EST AFRICAN AMERICAN: 68 mL/min/{1.73_m2} (ref >=60)
GFR, Est Non African American: 59 mL/min/{1.73_m2} — ABNORMAL LOW (ref >=60)
GLOBULIN: 2.5 g/dL (ref 1.9–3.7)
GLUCOSE: 107 mg/dL — AB (ref 65–99)
POTASSIUM: 4.7 mmol/L (ref 3.5–5.3)
SODIUM: 141 mmol/L (ref 135–146)
Total Protein: 7.2 g/dL (ref 6.1–8.1)

## 2018-04-08 LAB — TSH: TSH: 1.81 m[IU]/L (ref 0.40–4.50)

## 2018-04-08 LAB — CULTURE INDICATED

## 2018-04-08 LAB — URINE CULTURE
MICRO NUMBER:: 90996309
SPECIMEN QUALITY:: ADEQUATE

## 2018-04-08 LAB — MAGNESIUM: MAGNESIUM: 1.9 mg/dL (ref 1.5–2.5)

## 2018-04-09 ENCOUNTER — Encounter: Payer: Self-pay | Admitting: Adult Health

## 2018-04-13 DIAGNOSIS — G4733 Obstructive sleep apnea (adult) (pediatric): Secondary | ICD-10-CM | POA: Diagnosis not present

## 2018-04-16 ENCOUNTER — Other Ambulatory Visit: Payer: Self-pay | Admitting: Adult Health

## 2018-04-16 DIAGNOSIS — F319 Bipolar disorder, unspecified: Secondary | ICD-10-CM

## 2018-04-24 ENCOUNTER — Other Ambulatory Visit: Payer: Self-pay | Admitting: Physician Assistant

## 2018-04-28 NOTE — Progress Notes (Signed)
Assessment and Plan:  Kimberly Ray was seen today for medication management.  Diagnoses and all orders for this visit:  Bipolar depression ([place]) Will try boosting cymbalta to 120 mg daily per patient preference after discussion, advised limited benefit above doses of 60 mg, and advised to resume 60 mg dose if no improvement seen. Alternately we may consider switching back to prozac which was very effective previously.  Lifestyle discussed: diet/exerise, sleep hygiene, stress management, hydration Follow up in 6-8 weeks or sooner if needed  Further disposition pending results of labs. Discussed med's effects and SE's.   Over 15 minutes of exam, counseling, chart review, and critical decision making was performed.   Future Appointments  Date Time Provider [place]  07/21/2018  2:30 PM Liane Comber, NP [facility] None  08/05/2018  8:15 AM Dennie Bible, NP [facility] None  04/12/2019 10:00 AM Liane Comber, NP [facility] None    ------------------------------------------------------------------------------------------------------------------   HPI BP 122/76   Pulse 86   Temp (!) 97.3 F (36.3 C)   Ht 5\' 7"  (1.702 m)   Wt 183 lb (83 kg)   SpO2 96%   BMI 28.66 kg/m   67 y.o.female with hx of bipolar depression presents reporting she is feeling more angry and emotionally labile than usual, she has been under a lot of stress, was out of work, had surgery, mother has been struggling with health, conflict with housing/roommate. She was previously managed by psych but meds have been prescribed by this office for several years now. She is currently taking cymbalta 90 mg daily, lamotrigine 100 mg daily, ritalin 10 mg daily/BID PRN. She is also onropinirole and reports is sleeping much better since dose increase to 3 mg at night. PHQ-2 score of 9 today.   She has been been on prozac in the past and did very well with this.    Past Medical History:  Diagnosis Date  . Anemia    . Depression   . GERD (gastroesophageal reflux disease)   . Hay fever   . Head trauma   . IBS (irritable bowel syndrome)   . Memory loss   . Migraines   . OSA on CPAP   . Reflux   . RLS (restless legs syndrome)   . Sinus congestion      Allergies  Allergen Reactions  . Erythromycin Other (See Comments)    TBS  . Ivp Dye [Iodinated Diagnostic Agents] Other (See Comments)    Hot flashes, itching scalp  . Penicillins Hives  . Clarithromycin Other (See Comments)    Itching ankles  . Brintellix [Vortioxetine]     Itching  . Macrodantin [Nitrofurantoin]     Current Outpatient Medications on File Prior to Visit  Medication Sig  . DULoxetine (CYMBALTA) 60 MG capsule TAKE 1 CAPSULE (60 MG TOTAL) BY MOUTH 2 (TWO) TIMES DAILY. (Patient taking differently: Take 60 mg by mouth 2 (two) times daily. Patient states that she takes 90mg  daily.)  . eletriptan (RELPAX) 20 MG tablet Take 1 tablet (20 mg total) by mouth as needed for migraine or headache. 1 po at onset of headache. May repeat in 2 hours if headache persists or recurs.  . fenofibrate (TRICOR) 145 MG tablet Take 1 tablet (145 mg total) by mouth daily.  Marland Kitchen lamoTRIgine (LAMICTAL) 100 MG tablet TAKE 1 TABLET BY MOUTH EVERY DAY  . methylphenidate (RITALIN) 10 MG tablet Take 1 tablet 1 to 2  x/ day for ADD & Mood  . ondansetron (ZOFRAN) 4 MG tablet  Take 1 to 2 tablets 2 to 3 x / day if needed for Nausea  . ranitidine (ZANTAC) 300 MG tablet TAKE 1 TABLET BY MOUTH EVERY NIGHT FOR ACID REFLUX (Patient taking differently: TAKE 1 TABLET BY MOUTH EVERY NIGHT FOR ACID REFLUX AS NEEDED)  . rOPINIRole (REQUIP) 1 MG tablet TAKE 1/2 TO 2 TABLETS BY MOUTH AT NIGHT FOR RESTLESS LEGS AS NEEDED (Patient taking differently: Takes up to three tablets by mouth as needed at bedtime)   No current facility-administered medications on file prior to visit.     ROS: all negative except above.   Physical Exam:  BP 122/76   Pulse 86   Temp (!) 97.3 F  (36.3 C)   Ht 5\' 7"  (1.702 m)   Wt 183 lb (83 kg)   SpO2 96%   BMI 28.66 kg/m   General Appearance: Well nourished, in no apparent distress. Eyes: conjunctiva no swelling or erythema ENT/Mouth: Hearing normal.  Neck: Supple, thyroid normal.  Respiratory: Respiratory effort normal, BS equal bilaterally without rales, rhonchi, wheezing or stridor.  Cardio: RRR with no MRGs. Brisk peripheral pulses without edema.  Lymphatics: Non tender without lymphadenopathy.  Musculoskeletal: normal gait.  Skin: Warm, dry without rashes, lesions, ecchymosis.   Psych: Awake and oriented X 3, normal/calm affect, Insight and Judgment appropriate.     Izora Ribas, NP 3:44 PM [facility]

## 2018-04-29 ENCOUNTER — Ambulatory Visit: Payer: BLUE CROSS/BLUE SHIELD | Admitting: Adult Health

## 2018-04-29 ENCOUNTER — Encounter: Payer: Self-pay | Admitting: Adult Health

## 2018-04-29 VITALS — BP 122/76 | HR 86 | Temp 97.3°F | Ht 67.0 in | Wt 183.0 lb

## 2018-04-29 DIAGNOSIS — F319 Bipolar disorder, unspecified: Secondary | ICD-10-CM

## 2018-04-29 NOTE — Patient Instructions (Addendum)

## 2018-05-07 ENCOUNTER — Other Ambulatory Visit: Payer: Self-pay | Admitting: Physician Assistant

## 2018-05-07 DIAGNOSIS — F319 Bipolar disorder, unspecified: Secondary | ICD-10-CM

## 2018-05-12 ENCOUNTER — Other Ambulatory Visit: Payer: Self-pay | Admitting: Physician Assistant

## 2018-05-12 DIAGNOSIS — F319 Bipolar disorder, unspecified: Secondary | ICD-10-CM

## 2018-06-09 ENCOUNTER — Encounter: Payer: Self-pay | Admitting: Nurse Practitioner

## 2018-07-13 ENCOUNTER — Other Ambulatory Visit: Payer: BLUE CROSS/BLUE SHIELD

## 2018-07-19 ENCOUNTER — Encounter: Payer: Self-pay | Admitting: Adult Health

## 2018-07-19 ENCOUNTER — Ambulatory Visit (INDEPENDENT_AMBULATORY_CARE_PROVIDER_SITE_OTHER): Payer: Medicare HMO | Admitting: Adult Health

## 2018-07-19 VITALS — BP 138/88 | HR 87 | Temp 97.2°F | Ht 67.0 in | Wt 187.0 lb

## 2018-07-19 DIAGNOSIS — R079 Chest pain, unspecified: Secondary | ICD-10-CM

## 2018-07-19 DIAGNOSIS — J0100 Acute maxillary sinusitis, unspecified: Secondary | ICD-10-CM | POA: Diagnosis not present

## 2018-07-19 DIAGNOSIS — R42 Dizziness and giddiness: Secondary | ICD-10-CM

## 2018-07-19 DIAGNOSIS — R0600 Dyspnea, unspecified: Secondary | ICD-10-CM

## 2018-07-19 DIAGNOSIS — R002 Palpitations: Secondary | ICD-10-CM | POA: Diagnosis not present

## 2018-07-19 MED ORDER — PREDNISONE 20 MG PO TABS: ORAL_TABLET | ORAL | 0 refills | Status: DC

## 2018-07-19 MED ORDER — DOXYCYCLINE HYCLATE 100 MG PO CAPS: ORAL_CAPSULE | ORAL | 0 refills | Status: DC

## 2018-07-19 MED ORDER — PROMETHAZINE-DM 6.25-15 MG/5ML PO SYRP: 5.0000 mL | ORAL_SOLUTION | Freq: Four times a day (QID) | ORAL | 1 refills | Status: DC | PRN

## 2018-07-19 NOTE — Progress Notes (Deleted)
FOLLOW UP  Assessment and Plan:   BP Well controlled off of medications Monitor blood pressure at home; patient to call if consistently greater than 130/80 Continue DASH diet.   Reminder to go to the ER if any CP, SOB, nausea, dizziness, severe HA, changes vision/speech, left arm numbness and tingling and jaw pain.  Cholesterol Currently at LDL goal; continue fenofibrate for elevated trigs, lifestyle discussed at length Continue low cholesterol diet and exercise.  Check lipid panel.   Prediabetes Continue diet and exercise.  Perform daily foot/skin check, notify office of any concerning changes.  Check A1C  Overweight Long discussion about weight loss, diet, and exercise Recommended diet heavy in fruits and veggies and low in animal meats, cheeses, and dairy products, appropriate calorie intake Discussed ideal weight for height and initial weight goal (***) Patient will work on *** Will follow up in 3 months  Vitamin D Def At goal at last visit; continue supplementation to maintain goal of 70-100 Defer Vit D level  Bipolar depression/ADD Continue medications  The patient was counseled on the addictive nature of the medication and was encouraged to take drug holidays when not needed.  Lifestyle discussed: diet/exerise, sleep hygiene, stress management, hydration  Continue diet and meds as discussed. Further disposition pending results of labs. Discussed med's effects and SE's.   Over 30 minutes of exam, counseling, chart review, and critical decision making was performed.   Future Appointments  Date Time Provider [place]  07/21/2018  2:30 PM Liane Comber, NP [facility] None  08/05/2018  8:15 AM Dennie Bible, NP [facility] None  09/02/2018  8:00 AM [facility] DX DEXA 1 [facility]  04/12/2019 10:00 AM Liane Comber, NP [facility] None     ----------------------------------------------------------------------------------------------------------------------  HPI 67 y.o. female  presents for 3 month follow up on hypertension, cholesterol, prediabetes, weight and vitamin D deficiency. She is on CPAP for OSA.   She has dx of bipolar depression and ADD; She was previously managed by psych but meds have been prescribed by this office for several years now. She is currently taking cymbalta 90 *** 120 *** mg daily, lamotrigine 100 mg daily, ritalin 10 mg daily/BID PRN. She is also onropinirole and reports is sleeping much better since dose increase to 3 mg at night.  BMI is There is no height or weight on file to calculate BMI., she {HAS HAS NWG:95621} been working on diet and exercise. Wt Readings from Last 3 Encounters:  04/29/18 183 lb (83 kg)  04/06/18 177 lb (80.3 kg)  02/04/18 176 lb 1.6 oz (79.9 kg)   Her blood pressure {HAS HAS NOT:18834} been controlled at home, today their BP is    She {DOES_DOES HYQ:65784} workout. She denies chest pain, shortness of breath, dizziness.   She is on cholesterol medication (fenofibrate). Her LDL cholesterol is at goal. The cholesterol last visit was:   Lab Results  Component Value Date   CHOL 188 04/06/2018   HDL 45 (L) 04/06/2018   LDLCALC 98 04/06/2018   TRIG 334 (H) 04/06/2018   CHOLHDL 4.2 04/06/2018    She {Has/has not:18111} been working on diet and exercise for prediabetes, and denies {Symptoms; diabetes w/o none:19199}. Last A1C in the office was:  Lab Results  Component Value Date   HGBA1C 6.0 (H) 04/06/2018   Patient is on Vitamin D supplement.   Lab Results  Component Value Date   VD25OH 51 04/06/2018        Current Medications:  Current Outpatient Medications  on File Prior to Visit  Medication Sig  . DULoxetine (CYMBALTA) 30 MG capsule TAKE 1 CAPSULE BY MOUTH EVERY DAY WITH 60MG  CAPSULE, FOR A TOTAL OF 90MG  A DAY  . DULoxetine (CYMBALTA) 60 MG capsule TAKE 1  CAPSULE (60 MG TOTAL) BY MOUTH 2 (TWO) TIMES DAILY.  Marland Kitchen eletriptan (RELPAX) 20 MG tablet Take 1 tablet (20 mg total) by mouth as needed for migraine or headache. 1 po at onset of headache. May repeat in 2 hours if headache persists or recurs.  . fenofibrate (TRICOR) 145 MG tablet Take 1 tablet (145 mg total) by mouth daily.  Marland Kitchen lamoTRIgine (LAMICTAL) 100 MG tablet TAKE 1 TABLET BY MOUTH EVERY DAY  . methylphenidate (RITALIN) 10 MG tablet Take 1 tablet 1 to 2  x/ day for ADD & Mood  . ondansetron (ZOFRAN) 4 MG tablet Take 1 to 2 tablets 2 to 3 x / day if needed for Nausea  . ranitidine (ZANTAC) 300 MG tablet TAKE 1 TABLET BY MOUTH EVERY NIGHT FOR ACID REFLUX (Patient taking differently: TAKE 1 TABLET BY MOUTH EVERY NIGHT FOR ACID REFLUX AS NEEDED)  . rOPINIRole (REQUIP) 1 MG tablet TAKE 1/2 TO 2 TABLETS BY MOUTH AT NIGHT FOR RESTLESS LEGS AS NEEDED (Patient taking differently: Takes up to three tablets by mouth as needed at bedtime)   No current facility-administered medications on file prior to visit.      Allergies:  Allergies  Allergen Reactions  . Erythromycin Other (See Comments)    TBS  . Ivp Dye [Iodinated Diagnostic Agents] Other (See Comments)    Hot flashes, itching scalp  . Penicillins Hives  . Clarithromycin Other (See Comments)    Itching ankles  . Brintellix [Vortioxetine]     Itching  . Macrodantin [Nitrofurantoin]      Medical History:  Past Medical History:  Diagnosis Date  . Anemia   . Depression   . GERD (gastroesophageal reflux disease)   . Hay fever   . Head trauma   . IBS (irritable bowel syndrome)   . Memory loss   . Migraines   . OSA on CPAP   . Reflux   . RLS (restless legs syndrome)   . Sinus congestion    Family history- Reviewed and unchanged Social history- Reviewed and unchanged   Review of Systems:  Review of Systems  Constitutional: Negative for malaise/fatigue and weight loss.  HENT: Negative for hearing loss and tinnitus.   Eyes:  Negative for blurred vision and double vision.  Respiratory: Negative for cough, shortness of breath and wheezing.   Cardiovascular: Negative for chest pain, palpitations, orthopnea, claudication and leg swelling.  Gastrointestinal: Negative for abdominal pain, blood in stool, constipation, diarrhea, heartburn, melena, nausea and vomiting.  Genitourinary: Negative.   Musculoskeletal: Negative for joint pain and myalgias.  Skin: Negative for rash.  Neurological: Negative for dizziness, tingling, sensory change, weakness and headaches.  Endo/Heme/Allergies: Negative for polydipsia.  Psychiatric/Behavioral: Negative.   All other systems reviewed and are negative.    Physical Exam: There were no vitals taken for this visit. Wt Readings from Last 3 Encounters:  04/29/18 183 lb (83 kg)  04/06/18 177 lb (80.3 kg)  02/04/18 176 lb 1.6 oz (79.9 kg)   General Appearance: Well nourished, in no apparent distress. Eyes: PERRLA, EOMs, conjunctiva no swelling or erythema Sinuses: No Frontal/maxillary tenderness ENT/Mouth: Ext aud canals clear, TMs without erythema, bulging. No erythema, swelling, or exudate on post pharynx.  Tonsils not swollen or erythematous. Hearing normal.  Neck: Supple, thyroid normal.  Respiratory: Respiratory effort normal, BS equal bilaterally without rales, rhonchi, wheezing or stridor.  Cardio: RRR with no MRGs. Brisk peripheral pulses without edema.  Abdomen: Soft, + BS.  Non tender, no guarding, rebound, hernias, masses. Lymphatics: Non tender without lymphadenopathy.  Musculoskeletal: Full ROM, 5/5 strength, {PSY - GAIT AND STATION:22860} gait Skin: Warm, dry without rashes, lesions, ecchymosis.  Neuro: Cranial nerves intact. No cerebellar symptoms.  Psych: Awake and oriented X 3, normal affect, Insight and Judgment appropriate.    Kimberly Ribas, NP 9:17 AM [facility]

## 2018-07-19 NOTE — Progress Notes (Deleted)
FOLLOW UP  Assessment and Plan:   BP without diagnosis of htn Well controlled off of medications Monitor blood pressure at home; patient to call if consistently greater than 130/80 Continue DASH diet.   Reminder to go to the ER if any CP, SOB, nausea, dizziness, severe HA, changes vision/speech, left arm numbness and tingling and jaw pain.  Cholesterol Currently at LDL goal; continue fenofibrate for trigs, lifestyle discussed Continue low cholesterol diet and exercise.  Check lipid panel.   Prediabetes Continue diet and exercise.  Perform daily foot/skin check, notify office of any concerning changes.  Check A1C  Obesity with co morbidities Long discussion about weight loss, diet, and exercise Recommended diet heavy in fruits and veggies and low in animal meats, cheeses, and dairy products, appropriate calorie intake Discussed ideal weight for height (below ***) and initial weight goal (***) Patient will work on *** Will follow up in 3 months  Vitamin D Def Below goal at last visit;  continue supplementation to maintain goal of 70-100 Check Vit D level  Bipolar depression/ADD Continue medications *** Helps with focus, no AE's. The patient was counseled on the addictive nature of the medication and was encouraged to take drug holidays when not needed.  Lifestyle discussed: diet/exerise, sleep hygiene, stress management, hydration   Continue diet and meds as discussed. Further disposition pending results of labs. Discussed med's effects and SE's.   Over 30 minutes of exam, counseling, chart review, and critical decision making was performed.   Future Appointments  Date Time Provider [place]  07/19/2018  2:15 PM Liane Comber, NP [facility] None  08/05/2018  8:15 AM Dennie Bible, NP [facility] None  09/02/2018  8:00 AM [facility] DX DEXA 1 [facility]  04/12/2019 10:00 AM Liane Comber, NP [facility] None     ----------------------------------------------------------------------------------------------------------------------  HPI 67 y.o. female  presents for 3 month follow up on hypertension, cholesterol, prediabetes, weight and vitamin D deficiency.   She was previously managed by psych but meds have been prescribed by this office for several years now. She is currently taking cymbalta 90 ***120mg  *** mg daily, lamotrigine 100 mg daily, ritalin 10 mg daily/BID PRN. She is also onropinirole and reports is sleeping much better since dose increase to 3 mg at night.  BMI is There is no height or weight on file to calculate BMI., she {HAS HAS HCW:23762} been working on diet and exercise. Wt Readings from Last 3 Encounters:  04/29/18 183 lb (83 kg)  04/06/18 177 lb (80.3 kg)  02/04/18 176 lb 1.6 oz (79.9 kg)   Her blood pressure {HAS HAS NOT:18834} been controlled at home, today their BP is    She {DOES_DOES GBT:51761} workout. She denies chest pain, shortness of breath, dizziness.   She is on cholesterol medication Fenofibrate and denies myalgias. Her cholesterol is not at goal. The cholesterol last visit was:   Lab Results  Component Value Date   CHOL 188 04/06/2018   HDL 45 (L) 04/06/2018   LDLCALC 98 04/06/2018   TRIG 334 (H) 04/06/2018   CHOLHDL 4.2 04/06/2018    She {Has/has not:18111} been working on diet and exercise for prediabetes, and denies {Symptoms; diabetes w/o none:19199}. Last A1C in the office was:  Lab Results  Component Value Date   HGBA1C 6.0 (H) 04/06/2018   Patient is on Vitamin D supplement.   Lab Results  Component Value Date   VD25OH 51 04/06/2018        Current Medications:  Current Outpatient  Medications on File Prior to Visit  Medication Sig  . DULoxetine (CYMBALTA) 30 MG capsule TAKE 1 CAPSULE BY MOUTH EVERY DAY WITH 60MG  CAPSULE, FOR A TOTAL OF 90MG  A DAY  . DULoxetine (CYMBALTA) 60 MG capsule TAKE 1 CAPSULE (60 MG TOTAL) BY MOUTH 2 (TWO) TIMES  DAILY.  Marland Kitchen eletriptan (RELPAX) 20 MG tablet Take 1 tablet (20 mg total) by mouth as needed for migraine or headache. 1 po at onset of headache. May repeat in 2 hours if headache persists or recurs.  . fenofibrate (TRICOR) 145 MG tablet Take 1 tablet (145 mg total) by mouth daily.  Marland Kitchen lamoTRIgine (LAMICTAL) 100 MG tablet TAKE 1 TABLET BY MOUTH EVERY DAY  . methylphenidate (RITALIN) 10 MG tablet Take 1 tablet 1 to 2  x/ day for ADD & Mood  . ondansetron (ZOFRAN) 4 MG tablet Take 1 to 2 tablets 2 to 3 x / day if needed for Nausea  . ranitidine (ZANTAC) 300 MG tablet TAKE 1 TABLET BY MOUTH EVERY NIGHT FOR ACID REFLUX (Patient taking differently: TAKE 1 TABLET BY MOUTH EVERY NIGHT FOR ACID REFLUX AS NEEDED)  . rOPINIRole (REQUIP) 1 MG tablet TAKE 1/2 TO 2 TABLETS BY MOUTH AT NIGHT FOR RESTLESS LEGS AS NEEDED (Patient taking differently: Takes up to three tablets by mouth as needed at bedtime)   No current facility-administered medications on file prior to visit.      Allergies:  Allergies  Allergen Reactions  . Erythromycin Other (See Comments)    TBS  . Ivp Dye [Iodinated Diagnostic Agents] Other (See Comments)    Hot flashes, itching scalp  . Penicillins Hives  . Clarithromycin Other (See Comments)    Itching ankles  . Brintellix [Vortioxetine]     Itching  . Macrodantin [Nitrofurantoin]      Medical History:  Past Medical History:  Diagnosis Date  . Anemia   . Depression   . GERD (gastroesophageal reflux disease)   . Hay fever   . Head trauma   . IBS (irritable bowel syndrome)   . Memory loss   . Migraines   . OSA on CPAP   . Reflux   . RLS (restless legs syndrome)   . Sinus congestion    Family history- Reviewed and unchanged Social history- Reviewed and unchanged   Review of Systems:  Review of Systems  Constitutional: Negative for malaise/fatigue and weight loss.  HENT: Negative for hearing loss and tinnitus.   Eyes: Negative for blurred vision and double vision.   Respiratory: Negative for cough, shortness of breath and wheezing.   Cardiovascular: Negative for chest pain, palpitations, orthopnea, claudication and leg swelling.  Gastrointestinal: Negative for abdominal pain, blood in stool, constipation, diarrhea, heartburn, melena, nausea and vomiting.  Genitourinary: Negative.   Musculoskeletal: Negative for joint pain and myalgias.  Skin: Negative for rash.  Neurological: Negative for dizziness, tingling, sensory change, weakness and headaches.  Endo/Heme/Allergies: Negative for polydipsia.  Psychiatric/Behavioral: Negative.   All other systems reviewed and are negative.     Physical Exam: There were no vitals taken for this visit. Wt Readings from Last 3 Encounters:  04/29/18 183 lb (83 kg)  04/06/18 177 lb (80.3 kg)  02/04/18 176 lb 1.6 oz (79.9 kg)   General Appearance: Well nourished, in no apparent distress. Eyes: PERRLA, EOMs, conjunctiva no swelling or erythema Sinuses: No Frontal/maxillary tenderness ENT/Mouth: Ext aud canals clear, TMs without erythema, bulging. No erythema, swelling, or exudate on post pharynx.  Tonsils not swollen or erythematous.  Hearing normal.  Neck: Supple, thyroid normal.  Respiratory: Respiratory effort normal, BS equal bilaterally without rales, rhonchi, wheezing or stridor.  Cardio: RRR with no MRGs. Brisk peripheral pulses without edema.  Abdomen: Soft, + BS.  Non tender, no guarding, rebound, hernias, masses. Lymphatics: Non tender without lymphadenopathy.  Musculoskeletal: Full ROM, 5/5 strength, {PSY - GAIT AND STATION:22860} gait Skin: Warm, dry without rashes, lesions, ecchymosis.  Neuro: Cranial nerves intact. No cerebellar symptoms.  Psych: Awake and oriented X 3, normal affect, Insight and Judgment appropriate.    Izora Ribas, NP 12:49 PM [facility]

## 2018-07-19 NOTE — Progress Notes (Signed)
Assessment and Plan:  Kimberly Ray was seen today for breathing problem, anxiety and sinusitis.  Diagnoses and all orders for this visit:  Dyspnea/chest pain/dizziness/intermittent palpitations No sig hx of CVD, EKG without significant changes today, exam benign;  Check labs to r/o acute anemia, electrolyte imbalance, MI, PE; imaging deferred today as insurance has lapsed and is cash pay, discussed CXR/CT as indicated by labs today, stable VS, not in distress and non-toxic appearing The patient was advised to call immediately if she has any concerning symptoms in the interval. The patient voices understanding of current treatment options and is in agreement with the current care plan.The patient knows to call the clinic with any problems, questions or concerns or go to the ER if any further progression of symptoms.  -     CBC with Differential/Platelet -     COMPLETE METABOLIC PANEL WITH GFR -     TSH -     Troponin I -     D-dimer, quantitative (not at [facility]) -     EKG 12-Lead  Acute non-recurrent maxillary sinusitis Restart allergy medications Suggested symptomatic OTC remedies. Nasal saline spray for congestion. Nasal steroids, oral steroids offered Follow up as needed. -     promethazine-dextromethorphan (PROMETHAZINE-DM) 6.25-15 MG/5ML syrup; Take 5 mLs by mouth 4 (four) times daily as needed for cough. -     predniSONE (DELTASONE) 20 MG tablet; 2 tablets daily for 3 days, 1 tablet daily for 4 days. -     doxycycline (VIBRAMYCIN) 100 MG capsule; Take 1 capsule twice daily with food  Further disposition pending results of labs. Discussed med's effects and SE's.   Over 30 minutes of exam, counseling, chart review, and critical decision making was performed.   Future Appointments  Date Time Provider [place]  08/05/2018  8:15 AM Dennie Bible, NP [facility] None  09/02/2018  8:00 AM [facility] DX DEXA 1 [facility]  10/22/2018  8:45 AM Liane Comber, NP [facility]  None  04/12/2019 10:00 AM Liane Comber, NP [facility] None    ------------------------------------------------------------------------------------------------------------------   HPI BP 138/88   Pulse 87   Temp (!) 97.2 F (36.2 C)   Ht 5\' 7"  (1.702 m)   Wt 187 lb (84.8 kg)   SpO2 97%   BMI 29.29 kg/m   67 y.o.female with hx of asthma, bipolar, hyperlipidemia, anemia presents for sick visit; she reports she has noted some mild intermittent chest discomfort, mildly dyspneic, very anxious, mildly nauseous. "I feel like I've had a lot of caffeine" "like I should have tremors" but denies having any recently. She does endorse mild intermittent R shoulder pain, intermittent palpitations. She reports mood has been better, no problems with anxiety until today. She also endorses ongoing sinus congestion/facial pressure, recently stopped several "non-essential" medications, supplements, allergy medications for concern that this was contributing to RLS which she reports is well controlled recently on requip. She denies other changes in medications in the past 2 months. Denies fever/chills, headache, dizziness, fatigue, symptoms worse with exertion, LE edema, nocturnal dyspnea. She does endorse allergy symptoms, itchy/watery eyes, sneezing. She endorses occasional dry cough. Denies sore throat.   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 She is concerned regarding her sx due to upcoming travel plans. She has no significant CVD hx, no hx of clots, not on HRT, non-snoker, no recent extended travel.   She is currently in between insurance, medicare hasn't kicked in yet, is cash pay today.   She speaks in complete  sentences and doesn't appear in any acute distress, non-toxic appearing.    Past Medical History:  Diagnosis Date  . Anemia   . Depression   . GERD (gastroesophageal reflux disease)   . Hay fever   . Head trauma   . IBS (irritable bowel syndrome)   . Memory loss   .  Migraines   . OSA on CPAP   . Reflux   . RLS (restless legs syndrome)   . Sinus congestion      Allergies  Allergen Reactions  . Erythromycin Other (See Comments)    TBS  . Ivp Dye [Iodinated Diagnostic Agents] Other (See Comments)    Hot flashes, itching scalp  . Penicillins Hives  . Clarithromycin Other (See Comments)    Itching ankles  . Brintellix [Vortioxetine]     Itching  . Macrodantin [Nitrofurantoin]     Current Outpatient Medications on File Prior to Visit  Medication Sig  . DULoxetine (CYMBALTA) 60 MG capsule TAKE 1 CAPSULE (60 MG TOTAL) BY MOUTH 2 (TWO) TIMES DAILY.  Marland Kitchen eletriptan (RELPAX) 20 MG tablet Take 1 tablet (20 mg total) by mouth as needed for migraine or headache. 1 po at onset of headache. May repeat in 2 hours if headache persists or recurs.  . fenofibrate (TRICOR) 145 MG tablet Take 1 tablet (145 mg total) by mouth daily.  Marland Kitchen lamoTRIgine (LAMICTAL) 100 MG tablet TAKE 1 TABLET BY MOUTH EVERY DAY  . methylphenidate (RITALIN) 10 MG tablet Take 1 tablet 1 to 2  x/ day for ADD & Mood  . ondansetron (ZOFRAN) 4 MG tablet Take 1 to 2 tablets 2 to 3 x / day if needed for Nausea  . ranitidine (ZANTAC) 300 MG tablet TAKE 1 TABLET BY MOUTH EVERY NIGHT FOR ACID REFLUX (Patient taking differently: TAKE 1 TABLET BY MOUTH EVERY NIGHT FOR ACID REFLUX AS NEEDED)  . rOPINIRole (REQUIP) 1 MG tablet TAKE 1/2 TO 2 TABLETS BY MOUTH AT NIGHT FOR RESTLESS LEGS AS NEEDED (Patient taking differently: Takes up to three tablets by mouth as needed at bedtime)   No current facility-administered medications on file prior to visit.     ROS: all negative except above.   Physical Exam:  BP 138/88   Pulse 87   Temp (!) 97.2 F (36.2 C)   Ht 5\' 7"  (1.702 m)   Wt 187 lb (84.8 kg)   SpO2 97%   BMI 29.29 kg/m   General Appearance: Well nourished, in no apparent distress. Eyes: PERRLA, EOMs, conjunctiva no swelling or erythema Sinuses: No Frontal tenderness, + bilateral maxillary  tenderness ENT/Mouth: Ext aud canals clear, TMs without erythema, bulging. No erythema, swelling, or exudate on post pharynx.  Tonsils not swollen or erythematous. Hearing normal.  Neck: Supple, thyroid normal.  Respiratory: Respiratory effort normal, BS equal bilaterally without rales, rhonchi, wheezing or stridor.  Cardio: RRR with no MRGs. Brisk peripheral pulses without edema.  Abdomen: Soft, + BS.  Non tender, no guarding, rebound, hernias, masses. Lymphatics: Non tender without lymphadenopathy.  Musculoskeletal: Full ROM, 5/5 strength, normal gait.  Skin: Warm, dry without rashes, lesions, ecchymosis.  Neuro: Cranial nerves intact. Normal muscle tone, no cerebellar symptoms.  Psych: Awake and oriented X 3, normal affect, Insight and Judgment appropriate.     Izora Ribas, NP 5:15 PM [facility]

## 2018-07-20 LAB — COMPLETE METABOLIC PANEL WITH GFR
AG Ratio: 2 (calc) (ref 1.0–2.5)
ALT: 30 U/L — ABNORMAL HIGH (ref 6–29)
AST: 33 U/L (ref 10–35)
Albumin: 4.8 g/dL (ref 3.6–5.1)
Alkaline phosphatase (APISO): 74 U/L (ref 33–130)
BUN: 24 mg/dL (ref 7–25)
CO2: 29 mmol/L (ref 20–32)
CREATININE: 0.82 mg/dL (ref 0.50–0.99)
Calcium: 10.8 mg/dL — ABNORMAL HIGH (ref 8.6–10.4)
Chloride: 99 mmol/L (ref 98–110)
GFR, EST AFRICAN AMERICAN: 85 mL/min/{1.73_m2} (ref >=60)
GFR, Est Non African American: 74 mL/min/{1.73_m2} (ref >=60)
Globulin: 2.4 g/dL (calc) (ref 1.9–3.7)
Glucose, Bld: 150 mg/dL — ABNORMAL HIGH (ref 65–99)
Potassium: 4.4 mmol/L (ref 3.5–5.3)
Sodium: 137 mmol/L (ref 135–146)
TOTAL PROTEIN: 7.2 g/dL (ref 6.1–8.1)
Total Bilirubin: 0.4 mg/dL (ref 0.2–1.2)

## 2018-07-20 LAB — CBC WITH DIFFERENTIAL/PLATELET
Basophils Absolute: 43 cells/uL (ref 0–200)
Basophils Relative: 0.6 %
Eosinophils Absolute: 29 cells/uL (ref 15–500)
Eosinophils Relative: 0.4 %
HEMATOCRIT: 42.2 % (ref 35.0–45.0)
Hemoglobin: 13.8 g/dL (ref 11.7–15.5)
LYMPHS ABS: 1649 {cells}/uL (ref 850–3900)
MCH: 28 pg (ref 27.0–33.0)
MCHC: 32.7 g/dL (ref 32.0–36.0)
MCV: 85.6 fL (ref 80.0–100.0)
MPV: 10 fL (ref 7.5–12.5)
Monocytes Relative: 7.7 %
NEUTROS ABS: 4925 {cells}/uL (ref 1500–7800)
Neutrophils Relative %: 68.4 %
Platelets: 361 10*3/uL (ref 140–400)
RBC: 4.93 10*6/uL (ref 3.80–5.10)
RDW: 13.1 % (ref 11.0–15.0)
Total Lymphocyte: 22.9 %
WBC mixed population: 554 cells/uL (ref 200–950)
WBC: 7.2 10*3/uL (ref 3.8–10.8)

## 2018-07-20 LAB — TROPONIN I: Troponin I: <0.01 ng/mL (ref <=0.0)

## 2018-07-20 LAB — D-DIMER, QUANTITATIVE: D-Dimer, Quant: 0.37 mcg/mL FEU (ref <0.50)

## 2018-07-20 LAB — TSH: TSH: 1.62 mIU/L (ref 0.40–4.50)

## 2018-07-21 ENCOUNTER — Ambulatory Visit: Payer: Self-pay | Admitting: Adult Health

## 2018-08-04 ENCOUNTER — Telehealth: Payer: Self-pay | Admitting: *Deleted

## 2018-08-04 NOTE — Progress Notes (Signed)
[facility]  PATIENT: Kimberly Ray DOB: Jan 24, 1951   REASON FOR VISIT: Follow-up for restless legs and obstructive sleep apnea with  CPAP  HISTORY FROM: Patient    HISTORY OF PRESENT ILLNESS:Kimberly Ray is a 67 y.o. female , seen here as in a referral/ revisit  from Dr. Melford Aase for a new evaluation of CPAP.   [name] had been evaluated at the [facility] in the year 2012, she stated that she did not have follow-up was placed on a CPAP and was basically followed by the medical equipment company for supplies.  She had actually to sleep studies in the past the first one did not find enough apnea to be treated were the, but PLMS and restless legs were documented. In the meantime her sleep habits have changed and her concerns, also. 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 When she is on a " dead head" trip by plane she can sleep, in theory ,but has been asked by passengers if she had RLS ?   She carries a diagnosis of asthma, which has followed bronchitic infections in the past but is not frequent. She also takes methylphenidate for ADD which is really used for daytime sleepiness. She has carried a diagnosis of GERD continues to use a PPI, restless legs started getting on iron supplement, increased her walking.   Seasonal depression . Hep C ab positive. she will have regular screening labs with Vicie Mutters yearly. Labs were reviewed - normal HbA1c.   Sleep habits are as follows: The patient's intended bedtime is before 10 PM but due to her work requirements and residing between [place] and [place] she does have some irregularities. She can fall asleep within minutes she states, she can stay asleep for a period of several hours, and if she wouldn't have a bathroom break in between she would sleep uninterrupted sleep. She has no trouble sleeping for 12  hours if the opportunity arises. Her restless legs do not keep her from falling asleep. They are more PLMS evident while she sleeping. Her rise time is between 3 and 5 AM depending on which flight she has to attend to. 6 hours of nocturnal sleep. If she has some opportunity to sleep in daytime , she will do so and feels refreshed.  UPDATE 12/12/2018CM Kimberly Ray, 67 year old female returns for follow-up with a history of obstructive sleep apnea newly on CPAP.  In addition she has restless legs with and is on Requip.  She also relates that her father recently died of congestive heart failure the end of October.  CPAP compliance data dated 06/30/2017 to 07/29/2017 shows greater than 4 hours for 27 days and 90%.  Average usage 6 hours 47 minutes.  Set pressure 7 cm.  EPR level 1.  AHI 0.8.  She returns for reevaluation UPDATE 6/12/2019CM Kimberly Ray, 67 year old female returns for follow-up with history of obstructive sleep apnea on CPAP.  She is having a problem with her mask, she has  not called the equipment company.  Her restless legs are well controlled on Requip.  CPAP data dated 12/28/2017-01/26/2018 shows compliance greater than 4 hours at 77%.  Average usage 5 hours 35 minutes.  Set pressure 7 cm.  EPR level 3 leak 95th percentile at 40.  AHI 3.3 ESS 15 fatigue severity scale 55.  She had her gallbladder removed in March and  a hysterectomy in May.  She returns for reevaluation UPDATE 12/19/2019CM Kimberly Ray 67 year old female follow-up with history of obstructive sleep apnea on CPAP.  She also has a history of restless legs  well controlled on Requip when last seen however today she states they wake her up frequently.  Her CPAP compliance is 40% for 3 months however she had a trip to [place] and also was out of town.  She never takes her CPAP on trips.  She claims she has started on magnesium which is helped her restless legs recent hemoglobin hematocrit and iron studies by PCP were okay however she has not had a  recent ferritin level.  She recently retired and has had more anxiety.  She has a small lesion on her right chest which she says has suddenly appeared in the last couple of weeks.  It is irregular in shape and discolored.  She was advised to see a dermatologist.  She is also wanting to see a psychiatrist for her anxiety depression.  She returns for reevaluation REVIEW OF SYSTEMS: Full 14 system review of systems performed and notable only for those listed, all others are neg:  Constitutional: Fatigue Cardiovascular: neg Ear/Nose/Throat: neg  Skin: neg Eyes: neg Respiratory: Cough Gastroitestinal: neg Hematology/Lymphatic: Anemia Endocrine: neg Musculoskeletal: Joint pain Allergy/Immunology: Environmental allergies Neurological: neg Psychiatric: Depression, anxiety Sleep : Obstructive sleep apnea with CPAP, restless legs   ALLERGIES: Allergies  Allergen Reactions  . Erythromycin Other (See Comments)    TBS  . Iodinated Diagnostic Agents Other (See Comments)    Hot flashes, itching scalp Hot flashes, itching scalp  . Penicillins Hives  . Clarithromycin Other (See Comments)    Itching ankles  . Brintellix [Vortioxetine]     Itching  . Macrodantin [Nitrofurantoin]     HOME MEDICATIONS: Outpatient Medications Prior to Visit  Medication Sig Dispense Refill  . DULoxetine (CYMBALTA) 60 MG capsule TAKE 1 CAPSULE (60 MG TOTAL) BY MOUTH 2 (TWO) TIMES DAILY. 180 capsule 0  . eletriptan (RELPAX) 20 MG tablet Take 1 tablet (20 mg total) by mouth as needed for migraine or headache. 1 po at onset of headache. May repeat in 2 hours if headache persists or recurs. 24 tablet 3  . fenofibrate (TRICOR) 145 MG tablet Take 1 tablet (145 mg total) by mouth daily. 90 tablet 3  . lamoTRIgine (LAMICTAL) 100 MG tablet TAKE 1 TABLET BY MOUTH EVERY DAY 90 tablet 1  . methylphenidate (RITALIN) 10 MG tablet Take 1 tablet 1 to 2  x/ day for ADD & Mood 60 tablet 0  . ondansetron (ZOFRAN) 4 MG tablet Take 1  to 2 tablets 2 to 3 x / day if needed for Nausea 60 tablet 1  . ranitidine (ZANTAC) 300 MG tablet TAKE 1 TABLET BY MOUTH EVERY NIGHT FOR ACID REFLUX (Patient taking differently: TAKE 1 TABLET BY MOUTH EVERY NIGHT FOR ACID REFLUX AS NEEDED) 90 tablet 1  . rOPINIRole (REQUIP) 1 MG tablet TAKE 1/2 TO 2 TABLETS BY MOUTH AT NIGHT FOR RESTLESS LEGS AS NEEDED (Patient taking differently: Takes up to three tablets by mouth as needed at bedtime) 180 tablet 0  . promethazine-dextromethorphan (PROMETHAZINE-DM) 6.25-15 MG/5ML syrup Take 5 mLs by mouth 4 (four) times daily as needed for cough. (Patient not taking: Reported on 08/05/2018) 240 mL 1  . doxycycline (VIBRAMYCIN) 100 MG capsule Take 1 capsule twice daily with food 20 capsule 0  . predniSONE (DELTASONE) 20 MG tablet 2 tablets daily for 3 days, 1 tablet  daily for 4 days. 10 tablet 0   No facility-administered medications prior to visit.     PAST MEDICAL HISTORY: Past Medical History:  Diagnosis Date  . Anemia   . Depression   . GERD (gastroesophageal reflux disease)   . Hay fever   . Head trauma   . IBS (irritable bowel syndrome)   . Memory loss   . Migraines   . OSA on CPAP   . Reflux   . RLS (restless legs syndrome)   . Sinus congestion     PAST SURGICAL HISTORY: Past Surgical History:  Procedure Laterality Date  . BREAST BIOPSY Right 02/27/2014   Stereo- Benign  . CHOLECYSTECTOMY  11/03/2017   Dr. Rosendo Gros  . LAPAROSCOPIC HYSTERECTOMY  12/2017  . NASAL SINUS SURGERY    . perforated eardrum     left side  . TONSILLECTOMY AND ADENOIDECTOMY    . URETEROSCOPY     for ureteral stone    FAMILY HISTORY: Family History  Problem Relation Age of Onset  . Diabetes Mother   . Heart failure Father   . Diabetes Sister   . Breast cancer Maternal Grandmother 23  . Breast cancer Maternal Aunt 35  . Breast cancer Cousin   . Osteoporosis Brother   . Heart failure Maternal Grandfather 80  . Colon cancer Paternal Grandmother 61  .  Stomach cancer Maternal Aunt     SOCIAL HISTORY: Social History   Socioeconomic History  . Marital status: Divorced    Spouse name: Not on file  . Number of children: 2  . Years of education: 58  . 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 Financial resource strain: Not on file  . Food insecurity:    Worry: Not on file    Inability: Not on file  . Transportation needs:    Medical: Not on file    Non-medical: Not on file  Tobacco Use  . Smoking status: Never Smoker  . Smokeless tobacco: Never Used  Substance and Sexual Activity  . Alcohol use: Yes    Alcohol/week: 1.0 standard drinks    Types: 1 Shots of liquor per week    Comment: Once a year  . Drug use: No  . Sexual activity: Not Currently  Lifestyle  . Physical activity:    Days per week: 0 days    Minutes per session: 0 min  . Stress: Only a little  Relationships  . Social connections:    Talks on phone: Not on file    Gets together: Not on file    Attends religious service: Not on file    Active member of club or organization: Not on file    Attends meetings of clubs or organizations: Not on file    Relationship status: Not on file  . Intimate partner violence:    Fear of current or ex partner: Not on file    Emotionally abused: Not on file    Physically abused: Not on file    Forced sexual activity: Not on file  Other Topics Concern  . Not on file  Social History Narrative   Patient is single and lives at home alone. Patient is flight attendant. Patient has college education one year      Right handed.   Caffeine- Rare.      PHYSICAL EXAM  Vitals:   08/05/18 0806  BP: 114/69  Pulse: 87  Weight: 178 lb (80.7 kg)  Height: 5\' 7"  (1.702 m)   Body mass index is 27.88 kg/m.  Generalized: Well developed, in no acute distress  Head: normocephalic and atraumatic,. Oropharynx benign mallopatti 2 Neck:  Supple, circumference 15 Lungs clear Musculoskeletal: No deformity  Skin no edema, skin lesion on right chest which is irregular and discolored, advised to see dermatologist Neurological examination   Mentation: Alert oriented to time, place, history taking. Attention span and concentration appropriate. Recent and remote memory intact.  Follows all commands speech and language fluent.   Cranial nerve II-XII: .Pupils were equal round reactive to light extraocular movements were full, visual field were full on confrontational test. Facial sensation and strength were normal. hearing was intact to finger rubbing bilaterally. Uvula tongue midline. head turning and shoulder shrug were normal and symmetric.Tongue protrusion into cheek strength was normal. Motor: normal bulk and tone, full strength in the BUE, BLE,  Sensory: normal and symmetric to light touch, in the upper and lower extremities Coordination: finger-nose-finger, heel-to-shin bilaterally, no dysmetria Gait and Station: Rising up from seated position without assistance, normal stance,  moderate stride, good arm swing, smooth turning, able to perform tiptoe, and heel walking without difficulty. Tandem gait is steady  DIAGNOSTIC DATA (LABS, IMAGING, TESTING) - I reviewed patient records, labs, notes, testing and imaging myself where available.  Lab Results  Component Value Date   WBC 7.2 07/19/2018   HGB 13.8 07/19/2018   HCT 42.2 07/19/2018   MCV 85.6 07/19/2018   PLT 361 07/19/2018      Component Value Date/Time   NA 137 07/19/2018 1527   K 4.4 07/19/2018 1527   CL 99 07/19/2018 1527   CO2 29 07/19/2018 1527   GLUCOSE 150 (H) 07/19/2018 1527   BUN 24 07/19/2018 1527   CREATININE 0.82 07/19/2018 1527   CALCIUM 10.8 (H) 07/19/2018 1527   PROT 7.2 07/19/2018 1527   ALBUMIN 4.2 01/20/2017 1006   AST 33 07/19/2018 1527   ALT 30 (H) 07/19/2018 1527   ALKPHOS 86 01/20/2017 1006   BILITOT 0.4 07/19/2018 1527   GFRNONAA 74  07/19/2018 1527   GFRAA 85 07/19/2018 1527   Lab Results  Component Value Date   CHOL 188 04/06/2018   HDL 45 (L) 04/06/2018   LDLCALC 98 04/06/2018   TRIG 334 (H) 04/06/2018   CHOLHDL 4.2 04/06/2018   Lab Results  Component Value Date   HGBA1C 6.0 (H) 04/06/2018   Lab Results  Component Value Date   VITAMINB12 611 04/06/2018   Lab Results  Component Value Date   TSH 1.62 07/19/2018      ASSESSMENT AND PLAN  67 y.o. year old female  has a past medical history of Anemia, Depression, GERD (gastroesophageal reflux disease), Hay fever, Head trauma, IBS (irritable bowel syndrome), Memory loss, Migraines, OSA on CPAP, Reflux, RLS (restless legs syndrome), and Sinus congestion. here to follow-up for  CPAP compliance and restless legs.  CPAP compliance 04/11/2018-06/09/2018 shows usage days 24 out of 60 at 40%.  Greater than 4 hours at 30%.  Average usage 5 hours 27 minutes set pressure 7 cm AHI 1.5 ESS 22  CPAP compliance 40% she was advised to use her CPAP every night Continue same settings Continue  Requip for restless legs refilled by PCP Will order ferritin level Given name of a dermatologist to see Dr. Tonia Brooms with telephone number Given a name of a psychiatrist Dr. Casimiro Needle with telephone number Follow-up in 3 months Izora Gala  Cecille Rubin, [facility], [facility], APRN  [facility] [address] [place],  [address] [phone]

## 2018-08-04 NOTE — Telephone Encounter (Signed)
Spoke with patient and asked if she has CPAP cards for this RN to download for her FU tomorrow. She does have cards and will bring them. She uses two machines because she travels. She verbalized understanding of call.

## 2018-08-05 ENCOUNTER — Ambulatory Visit: Payer: Medicare HMO | Admitting: Nurse Practitioner

## 2018-08-05 ENCOUNTER — Encounter: Payer: Self-pay | Admitting: Nurse Practitioner

## 2018-08-05 VITALS — BP 114/69 | HR 87 | Ht 67.0 in | Wt 178.0 lb

## 2018-08-05 DIAGNOSIS — Z9989 Dependence on other enabling machines and devices: Secondary | ICD-10-CM | POA: Diagnosis not present

## 2018-08-05 DIAGNOSIS — G4733 Obstructive sleep apnea (adult) (pediatric): Secondary | ICD-10-CM | POA: Diagnosis not present

## 2018-08-05 DIAGNOSIS — D649 Anemia, unspecified: Secondary | ICD-10-CM | POA: Diagnosis not present

## 2018-08-05 DIAGNOSIS — D229 Melanocytic nevi, unspecified: Secondary | ICD-10-CM | POA: Diagnosis not present

## 2018-08-05 DIAGNOSIS — G2581 Restless legs syndrome: Secondary | ICD-10-CM | POA: Diagnosis not present

## 2018-08-05 DIAGNOSIS — L820 Inflamed seborrheic keratosis: Secondary | ICD-10-CM | POA: Diagnosis not present

## 2018-08-05 NOTE — Patient Instructions (Signed)
CPAP compliance 40% Continue same settings Continue  Requip for restless legs refilled by PCP Will order ferritin level Follow-up in 3 months

## 2018-08-06 ENCOUNTER — Encounter: Payer: Self-pay | Admitting: *Deleted

## 2018-08-06 LAB — FERRITIN: Ferritin: 87 ng/mL (ref 15–150)

## 2018-08-09 DIAGNOSIS — L821 Other seborrheic keratosis: Secondary | ICD-10-CM | POA: Diagnosis not present

## 2018-08-13 ENCOUNTER — Other Ambulatory Visit: Payer: Self-pay | Admitting: Physician Assistant

## 2018-08-13 MED ORDER — BUPROPION HCL ER (XL) 150 MG PO TB24: 150.0000 mg | ORAL_TABLET | ORAL | 2 refills | Status: DC

## 2018-08-16 ENCOUNTER — Encounter: Payer: Self-pay | Admitting: Adult Health

## 2018-08-16 ENCOUNTER — Ambulatory Visit (INDEPENDENT_AMBULATORY_CARE_PROVIDER_SITE_OTHER): Payer: Medicare HMO | Admitting: Adult Health

## 2018-08-16 VITALS — BP 112/70 | HR 97 | Temp 97.3°F | Ht 67.0 in | Wt 187.0 lb

## 2018-08-16 DIAGNOSIS — R7303 Prediabetes: Secondary | ICD-10-CM | POA: Diagnosis not present

## 2018-08-16 DIAGNOSIS — Z79899 Other long term (current) drug therapy: Secondary | ICD-10-CM

## 2018-08-16 DIAGNOSIS — E559 Vitamin D deficiency, unspecified: Secondary | ICD-10-CM | POA: Diagnosis not present

## 2018-08-16 DIAGNOSIS — D649 Anemia, unspecified: Secondary | ICD-10-CM | POA: Diagnosis not present

## 2018-08-16 DIAGNOSIS — E663 Overweight: Secondary | ICD-10-CM

## 2018-08-16 DIAGNOSIS — R69 Illness, unspecified: Secondary | ICD-10-CM | POA: Diagnosis not present

## 2018-08-16 DIAGNOSIS — E782 Mixed hyperlipidemia: Secondary | ICD-10-CM

## 2018-08-16 DIAGNOSIS — F319 Bipolar disorder, unspecified: Secondary | ICD-10-CM | POA: Diagnosis not present

## 2018-08-16 DIAGNOSIS — R030 Elevated blood-pressure reading, without diagnosis of hypertension: Secondary | ICD-10-CM | POA: Diagnosis not present

## 2018-08-16 MED ORDER — ELETRIPTAN HYDROBROMIDE 20 MG PO TABS: 20.0000 mg | ORAL_TABLET | ORAL | 3 refills | Status: DC | PRN

## 2018-08-16 MED ORDER — DULOXETINE HCL 60 MG PO CPEP: 60.0000 mg | ORAL_CAPSULE | Freq: Every day | ORAL | 0 refills | Status: DC

## 2018-08-16 MED ORDER — AZELASTINE HCL 0.1 % NA SOLN: 2.0000 | Freq: Two times a day (BID) | NASAL | 2 refills | Status: DC

## 2018-08-16 NOTE — Patient Instructions (Addendum)
Cut back to 60 mg daily on cymbalta - call me in 2 weeks or so to let me know how you are doing.   Schedule appointment with psychiatrist as discussed  Use light lamp  Try to get out and move daily  Know what a healthy weight is for you (roughly BMI <25) and aim to maintain this  Aim for 7+ servings of fruits and vegetables daily  65-80+ fluid ounces of water or unsweet tea for healthy kidneys  Limit to max 1 drink of alcohol per day; avoid smoking/tobacco  Limit animal fats in diet for cholesterol and heart health - choose grass fed whenever available  Avoid highly processed foods, and foods high in saturated/trans fats  Aim for low stress - take time to unwind and care for your mental health  Aim for 150 min of moderate intensity exercise weekly for heart health, and weights twice weekly for bone health  Aim for 7-9 hours of sleep daily   Major Depressive Disorder, Adult Major depressive disorder (MDD) is a mental health condition. MDD often makes you feel sad, hopeless, or helpless. MDD can also cause symptoms in your body. MDD can affect your:  Work.  School.  Relationships.  Other normal activities. MDD can range from mild to very bad. It may occur once (single episode MDD). It can also occur many times (recurrent MDD). The main symptoms of MDD often include:  Feeling sad, depressed, or irritable most of the time.  Loss of interest. MDD symptoms also include:  Sleeping too much or too little.  Eating too much or too little.  A change in your weight.  Feeling tired (fatigue) or having low energy.  Feeling worthless.  Feeling guilty.  Trouble making decisions.  Trouble thinking clearly.  Thoughts of suicide or harming others.  Feeling weak.  Feeling agitated.  Keeping yourself from being around other people (isolation). Follow these instructions at home: Activity  Do these things as told by your doctor: ? Go back to your normal  activities. ? Exercise regularly. ? Spend time outdoors. Alcohol  Talk with your doctor about how alcohol can affect your antidepressant medicines.  Do not drink alcohol. Or, limit how much alcohol you drink. ? This means no more than 1 drink a day for nonpregnant women and 2 drinks a day for men. One drink equals one of these:  12 oz of beer.  5 oz of wine.  1 oz of hard liquor. General instructions  Take over-the-counter and prescription medicines only as told by your doctor.  Eat a healthy diet.  Get plenty of sleep.  Find activities that you enjoy. Make time to do them.  Think about joining a support group. Your doctor may be able to suggest a group for you.  Keep all follow-up visits as told by your doctor. This is important. Where to find more information:  [employer] on Mental Illness: ? www.nami.[place]: ? https://carter.com/  National Suicide Prevention Lifeline: ? 6606021573. This is free, 24-hour help. Contact a doctor if:  Your symptoms get worse.  You have new symptoms. Get help right away if:  You self-harm.  You see, hear, taste, smell, or feel things that are not present (hallucinate). If you ever feel like you may hurt yourself or others, or have thoughts about taking your own life, get help right away. You can go to your nearest emergency department or call:  Your local emergency services (911 in the  [place]  A suicide crisis helpline, such as the National Suicide Prevention Lifeline: ? 303 093 0542. This is open 24 hours a day. This information is not intended to replace advice given to you by your health care provider. Make sure you discuss any questions you have with your health care provider. Document Released: 07/16/2015 Document Revised: 04/20/2016 Document Reviewed: 04/20/2016 Elsevier Interactive Patient Education  2019 [employer].    Bupropion tablets (Depression/Mood  Disorders) What is this medicine? BUPROPION (byoo PROE pee on) is used to treat depression. This medicine may be used for other purposes; ask your health care provider or pharmacist if you have questions. COMMON BRAND NAME(S): Wellbutrin What should I tell my health care provider before I take this medicine? They need to know if you have any of these conditions: -an eating disorder, such as anorexia or bulimia -bipolar disorder or psychosis -diabetes or high blood sugar, treated with medication -glaucoma -heart disease, previous heart attack, or irregular heart beat -head injury or brain tumor -high blood pressure -kidney or liver disease -seizures -suicidal thoughts or a previous suicide attempt -Tourette's syndrome -weight loss -an unusual or allergic reaction to bupropion, other medicines, foods, dyes, or preservatives -breast-feeding -pregnant or trying to become pregnant How should I use this medicine? Take this medicine by mouth with a glass of water. Follow the directions on the prescription label. You can take it with or without food. If it upsets your stomach, take it with food. Take your medicine at regular intervals. Do not take your medicine more often than directed. Do not stop taking this medicine suddenly except upon the advice of your doctor. Stopping this medicine too quickly may cause serious side effects or your condition may worsen. A special MedGuide will be given to you by the pharmacist with each prescription and refill. Be sure to read this information carefully each time. Talk to your pediatrician regarding the use of this medicine in children. Special care may be needed. Overdosage: If you think you have taken too much of this medicine contact a poison control center or emergency room at once. NOTE: This medicine is only for you. Do not share this medicine with others. What if I miss a dose? If you miss a dose, take it as soon as you can. If it is less than four  hours to your next dose, take only that dose and skip the missed dose. Do not take double or extra doses. What may interact with this medicine? Do not take this medicine with any of the following medications: -linezolid -MAOIs like Azilect, Carbex, Eldepryl, Marplan, Nardil, and Parnate -methylene blue (injected into a vein) -other medicines that contain bupropion like Zyban This medicine may also interact with the following medications: -alcohol -certain medicines for anxiety or sleep -certain medicines for blood pressure like metoprolol, propranolol -certain medicines for depression or psychotic disturbances -certain medicines for HIV or AIDS like efavirenz, lopinavir, nelfinavir, ritonavir -certain medicines for irregular heart beat like propafenone, flecainide -certain medicines for Parkinson's disease like amantadine, levodopa -certain medicines for seizures like carbamazepine, phenytoin, phenobarbital -cimetidine -clopidogrel -cyclophosphamide -digoxin -furazolidone -isoniazid -nicotine -orphenadrine -procarbazine -steroid medicines like prednisone or cortisone -stimulant medicines for attention disorders, weight loss, or to stay awake -tamoxifen -theophylline -thiotepa -ticlopidine -tramadol -warfarin This list may not describe all possible interactions. Give your health care provider a list of all the medicines, herbs, non-prescription drugs, or dietary supplements you use. Also tell them if you smoke, drink alcohol, or use illegal drugs. Some items may  interact with your medicine. What should I watch for while using this medicine? Tell your doctor if your symptoms do not get better or if they get worse. Visit your doctor or health care professional for regular checks on your progress. Because it may take several weeks to see the full effects of this medicine, it is important to continue your treatment as prescribed by your doctor. Patients and their families should watch  out for new or worsening thoughts of suicide or depression. Also watch out for sudden changes in feelings such as feeling anxious, agitated, panicky, irritable, hostile, aggressive, impulsive, severely restless, overly excited and hyperactive, or not being able to sleep. If this happens, especially at the beginning of treatment or after a change in dose, call your health care professional. Avoid alcoholic drinks while taking this medicine. Drinking excessive alcoholic beverages, using sleeping or anxiety medicines, or quickly stopping the use of these agents while taking this medicine may increase your risk for a seizure. Do not drive or use heavy machinery until you know how this medicine affects you. This medicine can impair your ability to perform these tasks. Do not take this medicine close to bedtime. It may prevent you from sleeping. Your mouth may get dry. Chewing sugarless gum or sucking hard candy, and drinking plenty of water may help. Contact your doctor if the problem does not go away or is severe. What side effects may I notice from receiving this medicine? Side effects that you should report to your doctor or health care professional as soon as possible: -allergic reactions like skin rash, itching or hives, swelling of the face, lips, or tongue -breathing problems -changes in vision -confusion -elevated mood, decreased need for sleep, racing thoughts, impulsive behavior -fast or irregular heartbeat -hallucinations, loss of contact with reality -increased blood pressure -redness, blistering, peeling or loosening of the skin, including inside the mouth -seizures -suicidal thoughts or other mood changes -unusually weak or tired -vomiting Side effects that usually do not require medical attention (report to your doctor or health care professional if they continue or are bothersome): -constipation -headache -loss of appetite -nausea -tremors -weight loss This list may not describe  all possible side effects. Call your doctor for medical advice about side effects. You may report side effects to FDA at 1-800-FDA-1088. Where should I keep my medicine? Keep out of the reach of children. Store at room temperature between 20 and 25 degrees C (68 and 77 degrees F), away from direct sunlight and moisture. Keep tightly closed. Throw away any unused medicine after the expiration date. NOTE: This sheet is a summary. It may not cover all possible information. If you have questions about this medicine, talk to your doctor, pharmacist, or health care provider.  2019 Elsevier/Gold Standard (2016-01-25 13:44:21)

## 2018-08-16 NOTE — Progress Notes (Signed)
Assessment and Plan:  Kimberly Ray was seen today for medication management.  Diagnoses and all orders for this visit:  Mixed hyperlipidemia Continue fenofibrate, lifestyle discussed Continue low cholesterol diet and exercise.  Check lipid panel.  -     Lipid panel -     TSH  Prediabetes Discussed disease and risks Discussed diet/exercise, weight management  A1C -     Hemoglobin A1c  Elevated BP without diagnosis of hypertension Monitor blood pressure at home; call if consistently over 130/80 Continue DASH diet.   Reminder to go to the ER if any CP, SOB, nausea, dizziness, severe HA, changes vision/speech, left arm numbness and tingling and jaw pain.  Bipolar depression ([place]) ? Bipolar depression, discussed to clarify with psych whether this is accurate She reports immediate notable improvement with wellbutrin, discussed tapering down on cymbalta due to limited perceived benefit, and due to limited benefit above 60 mg daily dose She will schedule appointment with geriatric psych She will call to report progress after tapering with cymbalta in 2 weeks, consider tapering further to 30 mg then d/c Lifestyle discussed: diet/exerise, sleep hygiene, stress management, hydration -     DULoxetine (CYMBALTA) 60 MG capsule; Take 1 capsule (60 mg total) by mouth daily.  Medication management -     CBC with Differential/Platelet -     COMPLETE METABOLIC PANEL WITH GFR -     Magnesium  Vitamin D deficiency -     VITAMIN D 25 Hydroxy (Vit-D Deficiency, Fractures)  Anemia, unspecified type -     CBC with Differential/Platelet  Overweight (BMI 25.0-29.9) Long discussion about weight loss, diet, and exercise Recommended diet heavy in fruits and veggies and low in animal meats, cheeses, and dairy products, appropriate calorie intake Discussed appropriate weight for height  Follow up at next visit  Other orders -     eletriptan (RELPAX) 20 MG tablet; Take 1 tablet (20 mg total) by mouth as  needed for migraine or headache. 1 po at onset of headache. May repeat in 2 hours if headache persists or recurs. -     azelastine (ASTELIN) 0.1 % nasal spray; Place 2 sprays into both nostrils 2 (two) times daily. Use in each nostril as directed    Further disposition pending results of labs. Discussed med's effects and SE's.   Over 30 minutes of exam, counseling, chart review, and critical decision making was performed.   Future Appointments  Date Time Provider [place]  09/02/2018  8:00 AM [facility] DX DEXA 1 [facility]  10/22/2018  8:45 AM Liane Comber, NP [facility] None  11/04/2018  9:15 AM Dennie Bible, NP [facility] None  04/12/2019 10:00 AM Liane Comber, NP [facility] None    ------------------------------------------------------------------------------------------------------------------   HPI BP 112/70   Pulse 97   Temp (!) 97.3 F (36.3 C)   Ht 5\' 7"  (1.702 m)   Wt 187 lb (84.8 kg)   SpO2 98%   BMI 29.29 kg/m   67 y.o.female with hx of biopolar depression, ADD presents for follow up after she reported increasing depression symptoms, sleeping all day, since retiring earlier this year. She has been on cymbalta, lamotrigine (reports has been on for several years for "severe depression"), has been off of ritalin since retirement.   She contacted the office a few days ago reporting increased symptoms of depression and was initiated on wellbutrin 150 mg daily on which she reports she has noted an immediate improvement.   She has reached out to a recommended  geriatric psychiatrist and will be following up with him.   BMI is Body mass index is 29.29 kg/m., she has not been working on diet and exercise. Wt Readings from Last 3 Encounters:  08/16/18 187 lb (84.8 kg)  08/05/18 178 lb (80.7 kg)  07/19/18 187 lb (84.8 kg)   Her blood pressure has been controlled at home, today their BP is BP: 112/70  She does not workout. She denies chest pain,  shortness of breath, dizziness.   She is on cholesterol medication (fenofibrate) and denies myalgias. Her cholesterol is not at goal. The cholesterol last visit was:   Lab Results  Component Value Date   CHOL 188 04/06/2018   HDL 45 (L) 04/06/2018   LDLCALC 98 04/06/2018   TRIG 334 (H) 04/06/2018   CHOLHDL 4.2 04/06/2018    She has not been working on diet and exercise for prediabetes, and denies nausea, paresthesia of the feet, polydipsia, polyuria, visual disturbances and vomiting. Last A1C in the office was:  Lab Results  Component Value Date   HGBA1C 6.0 (H) 04/06/2018   Ray is on Vitamin D supplement.   Lab Results  Component Value Date   VD25OH 51 04/06/2018       Past Medical History:  Diagnosis Date  . Anemia   . Depression   . GERD (gastroesophageal reflux disease)   . Hay fever   . Head trauma   . IBS (irritable bowel syndrome)   . Memory loss   . Migraines   . OSA on CPAP   . Reflux   . RLS (restless legs syndrome)   . Sinus congestion      Allergies  Allergen Reactions  . Erythromycin Other (See Comments)    TBS  . Iodinated Diagnostic Agents Other (See Comments)    Hot flashes, itching scalp Hot flashes, itching scalp  . Penicillins Hives  . Clarithromycin Other (See Comments)    Itching ankles  . Brintellix [Vortioxetine]     Itching  . Macrodantin [Nitrofurantoin]     Current Outpatient Medications on File Prior to Visit  Medication Sig  . buPROPion (WELLBUTRIN XL) 150 MG 24 hr tablet Take 1 tablet (150 mg total) by mouth every morning.  . DULoxetine (CYMBALTA) 60 MG capsule TAKE 1 CAPSULE (60 MG TOTAL) BY MOUTH 2 (TWO) TIMES DAILY.  . fenofibrate (TRICOR) 145 MG tablet Take 1 tablet (145 mg total) by mouth daily.  Marland Kitchen lamoTRIgine (LAMICTAL) 100 MG tablet TAKE 1 TABLET BY MOUTH EVERY DAY  . methylphenidate (RITALIN) 10 MG tablet Take 1 tablet 1 to 2  x/ day for ADD & Mood  . ondansetron (ZOFRAN) 4 MG tablet Take 1 to 2 tablets 2 to 3 x /  day if needed for Nausea  . promethazine-dextromethorphan (PROMETHAZINE-DM) 6.25-15 MG/5ML syrup Take 5 mLs by mouth 4 (four) times daily as needed for cough.  . ranitidine (ZANTAC) 300 MG tablet TAKE 1 TABLET BY MOUTH EVERY NIGHT FOR ACID REFLUX (Ray taking differently: TAKE 1 TABLET BY MOUTH EVERY NIGHT FOR ACID REFLUX AS NEEDED)  . rOPINIRole (REQUIP) 1 MG tablet TAKE 1/2 TO 2 TABLETS BY MOUTH AT NIGHT FOR RESTLESS LEGS AS NEEDED (Ray taking differently: Takes up to three tablets by mouth as needed at bedtime)   No current facility-administered medications on file prior to visit.     ROS: Review of Systems  Constitutional: Negative for malaise/fatigue and weight loss.  HENT: Negative for hearing loss and tinnitus.   Eyes: Negative for  blurred vision and double vision.  Respiratory: Negative for cough, shortness of breath and wheezing.   Cardiovascular: Negative for chest pain, palpitations, orthopnea, claudication and leg swelling.  Gastrointestinal: Negative for abdominal pain, blood in stool, constipation, diarrhea, heartburn, melena, nausea and vomiting.  Genitourinary: Negative.   Musculoskeletal: Negative for joint pain and myalgias.  Skin: Negative for rash.  Neurological: Negative for dizziness, tingling, sensory change, weakness and headaches.  Endo/Heme/Allergies: Negative for polydipsia.  Psychiatric/Behavioral: Positive for depression. Negative for hallucinations, memory loss, substance abuse and suicidal ideas. The Ray has insomnia. The Ray is not nervous/anxious.   All other systems reviewed and are negative.    Physical Exam:  BP 112/70   Pulse 97   Temp (!) 97.3 F (36.3 C)   Ht 5\' 7"  (1.702 m)   Wt 187 lb (84.8 kg)   SpO2 98%   BMI 29.29 kg/m   General Appearance: Well nourished, in no apparent distress. Eyes: PERRLA, EOMs, conjunctiva no swelling or erythema Sinuses: No Frontal/maxillary tenderness ENT/Mouth: Ext aud canals clear, TMs  without erythema, bulging. No erythema, swelling, or exudate on post pharynx.  Tonsils not swollen or erythematous. Hearing normal.  Neck: Supple, thyroid normal.  Respiratory: Respiratory effort normal, BS equal bilaterally without rales, rhonchi, wheezing or stridor.  Cardio: RRR with no MRGs. Brisk peripheral pulses without edema.  Abdomen: Soft, + BS.  Non tender, no guarding, rebound, hernias, masses. Lymphatics: Non tender without lymphadenopathy.  Musculoskeletal: Full ROM, 5/5 strength, normal gait.  Skin: Warm, dry without rashes, lesions, ecchymosis.  Neuro: Cranial nerves intact. Normal muscle tone, no cerebellar symptoms. Sensation intact.  Psych: Awake and oriented X 3, normal affect, speech is not pressured, Insight and Judgment appropriate.     Izora Ribas, NP 4:55 PM [facility]

## 2018-08-17 LAB — TSH: TSH: 2.8 m[IU]/L (ref 0.40–4.50)

## 2018-08-17 LAB — CBC WITH DIFFERENTIAL/PLATELET
Absolute Monocytes: 663 cells/uL (ref 200–950)
Basophils Absolute: 40 cells/uL (ref 0–200)
Basophils Relative: 0.6 %
Eosinophils Absolute: 60 cells/uL (ref 15–500)
Eosinophils Relative: 0.9 %
HCT: 42.3 % (ref 35.0–45.0)
Hemoglobin: 13.9 g/dL (ref 11.7–15.5)
Lymphs Abs: 1554 cells/uL (ref 850–3900)
MCH: 28.2 pg (ref 27.0–33.0)
MCHC: 32.9 g/dL (ref 32.0–36.0)
MCV: 85.8 fL (ref 80.0–100.0)
MPV: 10 fL (ref 7.5–12.5)
Monocytes Relative: 9.9 %
NEUTROS PCT: 65.4 %
Neutro Abs: 4382 cells/uL (ref 1500–7800)
Platelets: 372 10*3/uL (ref 140–400)
RBC: 4.93 10*6/uL (ref 3.80–5.10)
RDW: 12.8 % (ref 11.0–15.0)
Total Lymphocyte: 23.2 %
WBC: 6.7 10*3/uL (ref 3.8–10.8)

## 2018-08-17 LAB — LIPID PANEL
Cholesterol: 224 mg/dL — ABNORMAL HIGH (ref <200)
HDL: 42 mg/dL — ABNORMAL LOW (ref >50)
LDL Cholesterol (Calc): 132 mg/dL (calc) — ABNORMAL HIGH
Non-HDL Cholesterol (Calc): 182 mg/dL (calc) — ABNORMAL HIGH (ref <130)
Total CHOL/HDL Ratio: 5.3 (calc) — ABNORMAL HIGH (ref <5.0)
Triglycerides: 351 mg/dL — ABNORMAL HIGH (ref <150)

## 2018-08-17 LAB — COMPLETE METABOLIC PANEL WITH GFR
AG Ratio: 2 (calc) (ref 1.0–2.5)
ALT: 29 U/L (ref 6–29)
AST: 43 U/L — ABNORMAL HIGH (ref 10–35)
Albumin: 4.9 g/dL (ref 3.6–5.1)
Alkaline phosphatase (APISO): 76 U/L (ref 33–130)
BUN/Creatinine Ratio: 23 (calc) — ABNORMAL HIGH (ref 6–22)
BUN: 28 mg/dL — ABNORMAL HIGH (ref 7–25)
CO2: 26 mmol/L (ref 20–32)
Calcium: 10.7 mg/dL — ABNORMAL HIGH (ref 8.6–10.4)
Chloride: 102 mmol/L (ref 98–110)
Creat: 1.23 mg/dL — ABNORMAL HIGH (ref 0.50–0.99)
GFR, Est African American: 52 mL/min/{1.73_m2} — ABNORMAL LOW (ref >=60)
GFR, Est Non African American: 45 mL/min/{1.73_m2} — ABNORMAL LOW (ref >=60)
Globulin: 2.4 g/dL (calc) (ref 1.9–3.7)
Glucose, Bld: 103 mg/dL — ABNORMAL HIGH (ref 65–99)
POTASSIUM: 5.1 mmol/L (ref 3.5–5.3)
Sodium: 137 mmol/L (ref 135–146)
Total Bilirubin: 0.3 mg/dL (ref 0.2–1.2)
Total Protein: 7.3 g/dL (ref 6.1–8.1)

## 2018-08-17 LAB — HEMOGLOBIN A1C
HEMOGLOBIN A1C: 6.2 %{Hb} — AB (ref <5.7)
Mean Plasma Glucose: 131 (calc)
eAG (mmol/L): 7.3 (calc)

## 2018-08-17 LAB — MAGNESIUM: Magnesium: 2.1 mg/dL (ref 1.5–2.5)

## 2018-08-17 LAB — VITAMIN D 25 HYDROXY (VIT D DEFICIENCY, FRACTURES): Vit D, 25-Hydroxy: 69 ng/mL (ref 30–100)

## 2018-08-17 LAB — TIQ-MISC

## 2018-08-19 DIAGNOSIS — D229 Melanocytic nevi, unspecified: Secondary | ICD-10-CM | POA: Diagnosis not present

## 2018-08-25 ENCOUNTER — Other Ambulatory Visit: Payer: Self-pay | Admitting: Physician Assistant

## 2018-08-25 DIAGNOSIS — F319 Bipolar disorder, unspecified: Secondary | ICD-10-CM

## 2018-08-26 ENCOUNTER — Other Ambulatory Visit: Payer: Self-pay | Admitting: Adult Health

## 2018-08-26 ENCOUNTER — Ambulatory Visit (INDEPENDENT_AMBULATORY_CARE_PROVIDER_SITE_OTHER): Payer: Medicare HMO

## 2018-08-26 VITALS — BP 110/70 | HR 85 | Temp 98.1°F | Ht 67.0 in | Wt 187.0 lb

## 2018-08-26 DIAGNOSIS — N179 Acute kidney failure, unspecified: Secondary | ICD-10-CM

## 2018-08-26 LAB — COMPLETE METABOLIC PANEL WITH GFR
AG Ratio: 2 (calc) (ref 1.0–2.5)
ALT: 31 U/L — ABNORMAL HIGH (ref 6–29)
AST: 41 U/L — ABNORMAL HIGH (ref 10–35)
Albumin: 4.8 g/dL (ref 3.6–5.1)
Alkaline phosphatase (APISO): 77 U/L (ref 33–130)
BUN/Creatinine Ratio: 24 (calc) — ABNORMAL HIGH (ref 6–22)
BUN: 24 mg/dL (ref 7–25)
CO2: 26 mmol/L (ref 20–32)
Calcium: 10.7 mg/dL — ABNORMAL HIGH (ref 8.6–10.4)
Chloride: 102 mmol/L (ref 98–110)
Creat: 1.01 mg/dL — ABNORMAL HIGH (ref 0.50–0.99)
GFR, EST NON AFRICAN AMERICAN: 57 mL/min/{1.73_m2} — AB (ref >=60)
GFR, Est African American: 66 mL/min/{1.73_m2} (ref >=60)
GLUCOSE: 82 mg/dL (ref 65–99)
Globulin: 2.4 g/dL (calc) (ref 1.9–3.7)
Potassium: 4.5 mmol/L (ref 3.5–5.3)
Sodium: 139 mmol/L (ref 135–146)
Total Bilirubin: 0.4 mg/dL (ref 0.2–1.2)
Total Protein: 7.2 g/dL (ref 6.1–8.1)

## 2018-08-26 MED ORDER — BUPROPION HCL ER (XL) 150 MG PO TB24: 300.0000 mg | ORAL_TABLET | Freq: Every day | ORAL | 2 refills | Status: DC

## 2018-08-26 NOTE — Progress Notes (Signed)
Patient presents to the office for a nurse visit to have labs done to check kidney function. Medications reconciled and vitals taken and recorded. Patient states that she has been taking Wellbutrin 150mg  daily and has decreased Cymbalta 60mg  to once daily. States that she is feeling depressed again, wondering if increasing the Wellbutrin would be beneficial.

## 2018-08-27 ENCOUNTER — Other Ambulatory Visit: Payer: Self-pay | Admitting: Adult Health

## 2018-08-27 MED ORDER — BUPROPION HCL ER (XL) 300 MG PO TB24: 300.0000 mg | ORAL_TABLET | Freq: Every day | ORAL | 2 refills | Status: DC

## 2018-09-02 ENCOUNTER — Ambulatory Visit
Admission: RE | Admit: 2018-09-02 | Discharge: 2018-09-02 | Disposition: A | Discharge status: Home / Self Care | Payer: Medicare HMO | Source: Ambulatory Visit | Attending: Adult Health | Admitting: Adult Health

## 2018-09-02 DIAGNOSIS — M85852 Other specified disorders of bone density and structure, left thigh: Secondary | ICD-10-CM | POA: Diagnosis not present

## 2018-09-02 DIAGNOSIS — M8580 Other specified disorders of bone density and structure, unspecified site: Secondary | ICD-10-CM

## 2018-09-02 DIAGNOSIS — Z780 Asymptomatic menopausal state: Secondary | ICD-10-CM | POA: Diagnosis not present

## 2018-09-04 ENCOUNTER — Other Ambulatory Visit: Payer: Self-pay | Admitting: Internal Medicine

## 2018-09-04 DIAGNOSIS — G2581 Restless legs syndrome: Secondary | ICD-10-CM

## 2018-09-04 DIAGNOSIS — E782 Mixed hyperlipidemia: Secondary | ICD-10-CM

## 2018-09-04 MED ORDER — ROPINIROLE HCL 1 MG PO TABS: ORAL_TABLET | ORAL | 3 refills | Status: DC

## 2018-09-04 MED ORDER — FENOFIBRATE 145 MG PO TABS: ORAL_TABLET | ORAL | 3 refills | Status: DC

## 2018-09-16 ENCOUNTER — Telehealth: Payer: Self-pay

## 2018-09-16 NOTE — Telephone Encounter (Signed)
Patient aware.

## 2018-09-16 NOTE — Telephone Encounter (Signed)
Pharmacy states that patient had called them and told them that she has accidentally been taking two 300 mg of Bupropion for one week. She got confused. Has a weeks supply left. Pharmacy says that she told them she has been feeling "weird". Per patient, lightheaded, balance problems, cognitive issues. Patient wants to know how to taper to get herself back on track.

## 2018-09-20 ENCOUNTER — Other Ambulatory Visit: Payer: Self-pay

## 2018-09-20 DIAGNOSIS — F319 Bipolar disorder, unspecified: Secondary | ICD-10-CM

## 2018-09-20 MED ORDER — LAMOTRIGINE 100 MG PO TABS: 100.0000 mg | ORAL_TABLET | Freq: Every day | ORAL | 1 refills | Status: DC

## 2018-10-01 ENCOUNTER — Other Ambulatory Visit: Payer: Self-pay | Admitting: Internal Medicine

## 2018-10-01 DIAGNOSIS — F319 Bipolar disorder, unspecified: Secondary | ICD-10-CM

## 2018-10-05 ENCOUNTER — Other Ambulatory Visit: Payer: Self-pay

## 2018-10-05 DIAGNOSIS — Z1211 Encounter for screening for malignant neoplasm of colon: Secondary | ICD-10-CM

## 2018-10-05 LAB — POC HEMOCCULT BLD/STL (HOME/3-CARD/SCREEN)
Card #2 Fecal Occult Blod, POC: NEGATIVE
Card #3 Fecal Occult Blood, POC: NEGATIVE
Fecal Occult Blood, POC: NEGATIVE

## 2018-10-06 DIAGNOSIS — Z1211 Encounter for screening for malignant neoplasm of colon: Secondary | ICD-10-CM | POA: Diagnosis not present

## 2018-10-20 NOTE — Progress Notes (Deleted)
MEDICARE ANNUAL WELLNESS VISIT AND FOLLOW UP  Assessment:   Diagnoses and all orders for this visit:  Welcome to Medicare preventive visit  Other migraine without status migrainosus, not intractable ***  OSA on CPAP ***  Extrinsic asthma without complication, unspecified asthma severity, unspecified whether persistent Avoid triggers, monitor  Gastroesophageal reflux disease, esophagitis presence not specified Well managed at this time by medications;  Discussed diet, avoiding triggers and other lifestyle changes  Fatty liver Weight loss advised, avoid alcohol/tylenol, will monitor LFTs  Osteopenia, unspecified location - get dexa 2022, continue Vit D and Ca, weight bearing exercises  Vitamin D deficiency At goal at recent check; continue to recommend supplementation for goal of 70-100 Defer vitamin D level  RLS (restless legs syndrome)/ Periodic limb movement disorder (PLMD) Improved with requip  Overweight (BMI 25.0-29.9) Long discussion about weight loss, diet, and exercise Recommended diet heavy in fruits and veggies and low in animal meats, cheeses, and dairy products, appropriate calorie intake Patient will work on *** Discussed appropriate weight for height (below***) and initial goal (***) Follow up at next visit  Other abnormal glucose (prediabetes) Discussed disease and risks Discussed diet/exercise, weight management  A1C  Mixed hyperlipidemia Continue medications Continue low cholesterol diet and exercise.  Check lipid panel.   Medication management CBC, CMP/GFR  Excessive daytime sleepiness ***  Circadian rhythm sleep disorder ***  Bipolar depression ([place]) Continue medications  Lifestyle discussed: diet/exerise, sleep hygiene, stress management, hydration  Attention deficit disorder, unspecified hyperactivity presence ***     Over 40 minutes of exam, counseling, chart review and critical decision making was performed Future  Appointments  Date Time Provider [place]  10/22/2018  8:45 AM Liane Comber, NP [facility] None  11/04/2018  9:15 AM Dennie Bible, NP [facility] None  04/12/2019 10:00 AM Liane Comber, NP [facility] None     Plan:   During the course of the visit the patient was educated and counseled about appropriate screening and preventive services including:    Pneumococcal vaccine   Prevnar 13  Influenza vaccine  Td vaccine  Screening electrocardiogram  Bone densitometry screening  Colorectal cancer screening  Diabetes screening  Glaucoma screening  Nutrition counseling   Advanced directives: requested   Subjective:  Kimberly Ray is a 68 y.o. female who presents for Medicare Annual Wellness Visit and 3 month follow up. OSA on CPAP ***  She has been on cymbalta 60 mg daily, lamotrigine (reports has been on for several years for "severe depression"), has been off of ritalin since retirement. *** She reported increased symptoms of depression after retirement and was initiated on wellbutrin 150 mg daily on which she reports she has noted an immediate improvement.  She has reached out to a recommended geriatric psychiatrist and will be following up with him.   She takes requip for RLS.   BMI is There is no height or weight on file to calculate BMI., she {HAS HAS NTZ:00174} been working on diet and exercise. Wt Readings from Last 3 Encounters:  08/26/18 187 lb (84.8 kg)  08/16/18 187 lb (84.8 kg)  08/05/18 178 lb (80.7 kg)    Her blood pressure {HAS HAS NOT:18834} been controlled at home, today their BP is   She {DOES_DOES BSW:96759} workout. She denies chest pain, shortness of breath, dizziness.   She {ACTION; IS/IS [id number]} on cholesterol medication and denies myalgias. Her cholesterol {ACTION; IS/IS NOT:[id number]} at goal. The cholesterol last visit was:   Lab Results  Component Value Date  CHOL 224 (H) 08/16/2018   HDL 42 (L) 08/16/2018    LDLCALC 132 (H) 08/16/2018   TRIG 351 (H) 08/16/2018   CHOLHDL 5.3 (H) 08/16/2018    She {Has/has not:18111} been working on diet and exercise for prediabetes, and denies {Symptoms; diabetes w/o none:19199}. Last A1C in the office was:  Lab Results  Component Value Date   HGBA1C 6.2 (H) 08/16/2018   Last GFR: Lab Results  Component Value Date   GFRNONAA 57 (L) 08/26/2018   Patient is on Vitamin D supplement.   Lab Results  Component Value Date   VD25OH 69 08/16/2018      Medication Review: Current Outpatient Medications on File Prior to Visit  Medication Sig Dispense Refill  . azelastine (ASTELIN) 0.1 % nasal spray Place 2 sprays into both nostrils 2 (two) times daily. Use in each nostril as directed (Patient not taking: Reported on 08/26/2018) 30 mL 2  . buPROPion (WELLBUTRIN XL) 300 MG 24 hr tablet Take 1 tablet (300 mg total) by mouth daily. 30 tablet 2  . DULoxetine (CYMBALTA) 60 MG capsule TAKE ONE CAPSULE BY MOUTH TWICE A DAY (Patient taking differently: Takes once a day. ** Do NOT chew, crush, or open capsule **) 180 capsule 0  . eletriptan (RELPAX) 20 MG tablet Take 1 tablet (20 mg total) by mouth as needed for migraine or headache. 1 po at onset of headache. May repeat in 2 hours if headache persists or recurs. (Patient not taking: Reported on 08/26/2018) 24 tablet 3  . fenofibrate (TRICOR) 145 MG tablet Take 1 tablet Daily for Blood Fats (Triglycerides) 90 tablet 3  . lamoTRIgine (LAMICTAL) 100 MG tablet TAKE 1 TABLET BY MOUTH EVERY DAY 90 tablet 1  . methylphenidate (RITALIN) 10 MG tablet Take 1 tablet 1 to 2  x/ day for ADD & Mood 60 tablet 0  . ondansetron (ZOFRAN) 4 MG tablet Take 1 to 2 tablets 2 to 3 x / day if needed for Nausea 60 tablet 1  . ranitidine (ZANTAC) 300 MG tablet TAKE 1 TABLET BY MOUTH EVERY NIGHT FOR ACID REFLUX (Patient not taking: Reported on 08/26/2018) 90 tablet 1  . rOPINIRole (REQUIP) 1 MG tablet Take 3 tablets at Bedtime for Restless Legs 270 tablet 3    No current facility-administered medications on file prior to visit.     Allergies  Allergen Reactions  . Erythromycin Other (See Comments)    TBS  . Iodinated Diagnostic Agents Other (See Comments)    Hot flashes, itching scalp Hot flashes, itching scalp  . Penicillins Hives  . Clarithromycin Other (See Comments)    Itching ankles  . Brintellix [Vortioxetine]     Itching  . Macrodantin [Nitrofurantoin]     Current Problems (verified) Patient Active Problem List   Diagnosis Date Noted  . Osteopenia 04/06/2018  . Fatty liver 08/29/2017  . Migraines 03/25/2017  . Excessive daytime sleepiness 03/25/2017  . Circadian rhythm sleep disorder 03/25/2017  . Periodic limb movement disorder (PLMD) 03/25/2017  . Overweight (BMI 25.0-29.9) 06/16/2015  . Medication management 06/15/2015  . Other abnormal glucose (prediabetes) 11/09/2014  . Vitamin D deficiency 11/09/2014  . GERD  11/09/2014  . Extrinsic asthma 11/09/2014  . Bipolar depression ([place]) 06/07/2014  . RLS (restless legs syndrome)   . OSA on CPAP   . Mixed hyperlipidemia 06/29/2013  . ADD (attention deficit disorder) 06/29/2013    Screening Tests Immunization History  Administered Date(s) Administered  . Influenza, High Dose Seasonal PF 06/12/2016, 05/13/2017  .  Influenza-Unspecified 06/02/2015  . PPD Test 08/19/2011, 08/30/2013, 11/09/2014  . Pneumococcal Conjugate-13 08/18/2009  . Pneumococcal Polysaccharide-23 06/12/2016  . Td 08/19/2003  . Tdap 08/30/2013  . Zoster 08/18/2010   Tetanus: 2015 Pneumovax: 2017 Prevnar: 2011 Flu vaccine: 2018 *** Zostavax: 2012  PAP: 2019, s/p TAH MGM: 12/2017 DEXA: 08/2018 T -2.0 Colonoscopy: 09/2011 due 2023 EGD: 2019  Names of Other Physician/Practitioners you currently use: 1. [facility] here for primary care 2. Dr. Idolina Primer, eye doctor, last visit 2019, annually 3. Dr. Tama Gander, dentist, last visit 2019, goes q43m  Last Derm  Exam: few years ago  Patient Care Team: Unk Pinto, MD as PCP - General (Internal Medicine) Clarene Essex, MD as Consulting Physician (Gastroenterology) Royston Sinner Colin Benton, MD as Consulting Physician (Obstetrics and Gynecology)  SURGICAL HISTORY She  has a past surgical history that includes perforated eardrum; Nasal sinus surgery; Tonsillectomy and adenoidectomy; Breast biopsy (Right, 02/27/2014); Cholecystectomy (11/03/2017); Laparoscopic hysterectomy (12/2017); and Ureteroscopy. FAMILY HISTORY Her family history includes Breast cancer in her cousin; Breast cancer (age of onset: 77) in her maternal aunt; Breast cancer (age of onset: 76) in her maternal grandmother; Colon cancer (age of onset: 80) in her paternal grandmother; Diabetes in her mother and sister; Heart failure in her father; Heart failure (age of onset: 34) in her maternal grandfather; Osteoporosis in her brother; Stomach cancer in her maternal aunt. SOCIAL HISTORY She  reports that she has never smoked. She has never used smokeless tobacco. She reports current alcohol use of about 1.0 standard drinks of alcohol per week. She reports that she does not use drugs.   MEDICARE WELLNESS OBJECTIVES: Physical activity:   Cardiac risk factors:   Depression/mood screen:   Depression screen [facility] 2/9 08/16/2018  Decreased Interest 3  Down, Depressed, Hopeless 3  PHQ - 2 Score 6  Altered sleeping 3  Tired, decreased energy 3  Change in appetite 3  Feeling bad or failure about yourself  2  Trouble concentrating 2  Moving slowly or fidgety/restless 3  Suicidal thoughts 0  PHQ-9 Score 22  Difficult doing work/chores Very difficult    ADLs:  In your present state of health, do you have any difficulty performing the following activities: 01/12/2018  Hearing? N  Vision? N  Difficulty concentrating or making decisions? N  Walking or climbing stairs? N  Dressing or bathing? N  Doing errands, shopping? N  Some recent data  might be hidden     Cognitive Testing  Alert? Yes  Normal Appearance?Yes  Oriented to person? Yes  Place? Yes   Time? Yes  Recall of three objects?  Yes  Can perform simple calculations? Yes  Displays appropriate judgment?Yes  Can read the correct time from a watch face?Yes  EOL planning:    ROS   Objective:     There were no vitals filed for this visit. There is no height or weight on file to calculate BMI.  General appearance: alert, no distress, WD/WN, female HEENT: normocephalic, sclerae anicteric, TMs pearly, nares patent, no discharge or erythema, pharynx normal Oral cavity: MMM, no lesions Neck: supple, no lymphadenopathy, no thyromegaly, no masses Heart: RRR, normal S1, S2, no murmurs Lungs: CTA bilaterally, no wheezes, rhonchi, or rales Abdomen: +bs, soft, non tender, non distended, no masses, no hepatomegaly, no splenomegaly Musculoskeletal: nontender, no swelling, no obvious deformity Extremities: no edema, no cyanosis, no clubbing Pulses: 2+ symmetric, upper and lower extremities, normal cap refill Neurological: alert, oriented x 3, CN2-12 intact,  strength normal upper extremities and lower extremities, sensation normal throughout, DTRs 2+ throughout, no cerebellar signs, gait normal Psychiatric: normal affect, behavior normal, pleasant   EKG:  AAA [name]:   Medicare Attestation I have personally reviewed: The patient's medical and social history Their use of alcohol, tobacco or illicit drugs Their current medications and supplements The patient's functional ability including ADLs,fall risks, home safety risks, cognitive, and hearing and visual impairment Diet and physical activities Evidence for depression or mood disorders  The patient's weight, height, BMI, and visual acuity have been recorded in the chart.  I have made referrals, counseling, and provided education to the patient based on review of the above and I have provided the patient with a written  personalized care plan for preventive services.     Izora Ribas, NP   10/20/2018

## 2018-10-22 ENCOUNTER — Ambulatory Visit: Payer: Self-pay | Admitting: Adult Health

## 2018-10-27 NOTE — Progress Notes (Deleted)
MEDICARE ANNUAL WELLNESS VISIT AND FOLLOW UP  Assessment:   Diagnoses and all orders for this visit:  Welcome to Medicare preventive visit  Other migraine without status migrainosus, not intractable ***  OSA on CPAP ***  Extrinsic asthma without complication, unspecified asthma severity, unspecified whether persistent Avoid triggers, monitor  Gastroesophageal reflux disease, esophagitis presence not specified Well managed at this time by medications;  Discussed diet, avoiding triggers and other lifestyle changes  Fatty liver Weight loss advised, avoid alcohol/tylenol, will monitor LFTs  Osteopenia, unspecified location - get dexa 2022, continue Vit D and Ca, weight bearing exercises  Vitamin D deficiency At goal at recent check; continue to recommend supplementation for goal of 70-100 Defer vitamin D level  RLS (restless legs syndrome)/ Periodic limb movement disorder (PLMD) Improved with requip  Overweight (BMI 25.0-29.9) Long discussion about weight loss, diet, and exercise Recommended diet heavy in fruits and veggies and low in animal meats, cheeses, and dairy products, appropriate calorie intake Patient will work on *** Discussed appropriate weight for height (below***) and initial goal (***) Follow up at next visit  Other abnormal glucose (prediabetes) Discussed disease and risks Discussed diet/exercise, weight management  A1C  Mixed hyperlipidemia Continue medications Continue low cholesterol diet and exercise.  Check lipid panel.   Medication management CBC, CMP/GFR  Circadian rhythm sleep disorder ***  Bipolar depression ([place]) Continue medications  Lifestyle discussed: diet/exerise, sleep hygiene, stress management, hydration  Attention deficit disorder, unspecified hyperactivity presence ***     Over 40 minutes of exam, counseling, chart review and critical decision making was performed Future Appointments  Date Time Provider [place]  10/29/2018  9:00 AM Liane Comber, NP [facility] None  11/04/2018  9:15 AM Dennie Bible, NP [facility] None  04/12/2019 10:00 AM Liane Comber, NP [facility] None     Plan:   During the course of the visit the patient was educated and counseled about appropriate screening and preventive services including:    Pneumococcal vaccine   Prevnar 13  Influenza vaccine  Td vaccine  Screening electrocardiogram  Bone densitometry screening  Colorectal cancer screening  Diabetes screening  Glaucoma screening  Nutrition counseling   Advanced directives: requested   Subjective:  Kimberly Ray is a 68 y.o. female who presents for Medicare Annual Wellness Visit and 3 month follow up. OSA on CPAP ***  She has been on cymbalta 60 mg daily, lamotrigine (reports has been on for several years for "severe depression"), has been off of ritalin since retirement. *** She reported increased symptoms of depression after retirement and was initiated on wellbutrin 150 mg daily on which she reports she has noted an immediate improvement.  She has reached out to a recommended geriatric psychiatrist and will be following up with him.   She takes requip for RLS.   BMI is There is no height or weight on file to calculate BMI., she {HAS HAS OVF:64332} been working on diet and exercise. Wt Readings from Last 3 Encounters:  08/26/18 187 lb (84.8 kg)  08/16/18 187 lb (84.8 kg)  08/05/18 178 lb (80.7 kg)    Her blood pressure {HAS HAS NOT:18834} been controlled at home, today their BP is   She {DOES_DOES RJJ:88416} workout. She denies chest pain, shortness of breath, dizziness.   She {ACTION; IS/IS SAY:30160109} on cholesterol medication and denies myalgias. Her cholesterol {ACTION; IS/IS NOT:21021397} at goal. The cholesterol last visit was:   Lab Results  Component Value Date   CHOL 224 (H) 08/16/2018  HDL 42 (L) 08/16/2018   LDLCALC 132 (H) 08/16/2018   TRIG 351 (H)  08/16/2018   CHOLHDL 5.3 (H) 08/16/2018    She {Has/has not:18111} been working on diet and exercise for prediabetes, and denies {Symptoms; diabetes w/o none:19199}. Last A1C in the office was:  Lab Results  Component Value Date   HGBA1C 6.2 (H) 08/16/2018   Last GFR: Lab Results  Component Value Date   GFRNONAA 57 (L) 08/26/2018   Patient is on Vitamin D supplement.   Lab Results  Component Value Date   VD25OH 69 08/16/2018      Medication Review: Current Outpatient Medications on File Prior to Visit  Medication Sig Dispense Refill  . azelastine (ASTELIN) 0.1 % nasal spray Place 2 sprays into both nostrils 2 (two) times daily. Use in each nostril as directed (Patient not taking: Reported on 08/26/2018) 30 mL 2  . buPROPion (WELLBUTRIN XL) 300 MG 24 hr tablet Take 1 tablet (300 mg total) by mouth daily. 30 tablet 2  . DULoxetine (CYMBALTA) 60 MG capsule TAKE ONE CAPSULE BY MOUTH TWICE A DAY (Patient taking differently: Takes once a day. ** Do NOT chew, crush, or open capsule **) 180 capsule 0  . eletriptan (RELPAX) 20 MG tablet Take 1 tablet (20 mg total) by mouth as needed for migraine or headache. 1 po at onset of headache. May repeat in 2 hours if headache persists or recurs. (Patient not taking: Reported on 08/26/2018) 24 tablet 3  . fenofibrate (TRICOR) 145 MG tablet Take 1 tablet Daily for Blood Fats (Triglycerides) 90 tablet 3  . lamoTRIgine (LAMICTAL) 100 MG tablet TAKE 1 TABLET BY MOUTH EVERY DAY 90 tablet 1  . methylphenidate (RITALIN) 10 MG tablet Take 1 tablet 1 to 2  x/ day for ADD & Mood 60 tablet 0  . ondansetron (ZOFRAN) 4 MG tablet Take 1 to 2 tablets 2 to 3 x / day if needed for Nausea 60 tablet 1  . ranitidine (ZANTAC) 300 MG tablet TAKE 1 TABLET BY MOUTH EVERY NIGHT FOR ACID REFLUX (Patient not taking: Reported on 08/26/2018) 90 tablet 1  . rOPINIRole (REQUIP) 1 MG tablet Take 3 tablets at Bedtime for Restless Legs 270 tablet 3   No current facility-administered  medications on file prior to visit.     Allergies  Allergen Reactions  . Erythromycin Other (See Comments)    TBS  . Iodinated Diagnostic Agents Other (See Comments)    Hot flashes, itching scalp Hot flashes, itching scalp  . Penicillins Hives  . Clarithromycin Other (See Comments)    Itching ankles  . Brintellix [Vortioxetine]     Itching  . Macrodantin [Nitrofurantoin]     Current Problems (verified) Patient Active Problem List   Diagnosis Date Noted  . Osteopenia 04/06/2018  . Fatty liver 08/29/2017  . Migraines 03/25/2017  . Circadian rhythm sleep disorder 03/25/2017  . Periodic limb movement disorder (PLMD) 03/25/2017  . Overweight (BMI 25.0-29.9) 06/16/2015  . Medication management 06/15/2015  . Other abnormal glucose (prediabetes) 11/09/2014  . Vitamin D deficiency 11/09/2014  . GERD  11/09/2014  . Extrinsic asthma 11/09/2014  . Bipolar depression ([place]) 06/07/2014  . RLS (restless legs syndrome)   . OSA on CPAP   . Mixed hyperlipidemia 06/29/2013  . ADD (attention deficit disorder) 06/29/2013    Screening Tests Immunization History  Administered Date(s) Administered  . Influenza, High Dose Seasonal PF 06/12/2016, 05/13/2017  . Influenza-Unspecified 06/02/2015  . PPD Test 08/19/2011, 08/30/2013, 11/09/2014  .  Pneumococcal Conjugate-13 08/18/2009  . Pneumococcal Polysaccharide-23 06/12/2016  . Td 08/19/2003  . Tdap 08/30/2013  . Zoster 08/18/2010   Tetanus: 2015 Pneumovax: 2017 Prevnar: 2011 Flu vaccine: 2018 *** Zostavax: 2012  PAP: 2019, s/p TAH MGM: 12/2017 DEXA: 08/2018 T -2.0 Colonoscopy: 09/2011 due 2023 EGD: 2019  Names of Other Physician/Practitioners you currently use: 1. [facility] here for primary care 2. Dr. Idolina Primer, eye doctor, last visit 2019, annually 3. Dr. Tama Gander, dentist, last visit 2019, goes q54m  Last Derm Exam: few years ago  Patient Care Team: Unk Pinto, MD as PCP - General  (Internal Medicine) Clarene Essex, MD as Consulting Physician (Gastroenterology) Royston Sinner Colin Benton, MD as Consulting Physician (Obstetrics and Gynecology)  SURGICAL HISTORY She  has a past surgical history that includes perforated eardrum; Nasal sinus surgery; Tonsillectomy and adenoidectomy; Breast biopsy (Right, 02/27/2014); Cholecystectomy (11/03/2017); Laparoscopic hysterectomy (12/2017); and Ureteroscopy. FAMILY HISTORY Her family history includes Breast cancer in her cousin; Breast cancer (age of onset: 50) in her maternal aunt; Breast cancer (age of onset: 20) in her maternal grandmother; Colon cancer (age of onset: 29) in her paternal grandmother; Diabetes in her mother and sister; Heart failure in her father; Heart failure (age of onset: 91) in her maternal grandfather; Osteoporosis in her brother; Stomach cancer in her maternal aunt. SOCIAL HISTORY She  reports that she has never smoked. She has never used smokeless tobacco. She reports current alcohol use of about 1.0 standard drinks of alcohol per week. She reports that she does not use drugs.   MEDICARE WELLNESS OBJECTIVES: Physical activity:   Cardiac risk factors:   Depression/mood screen:   Depression screen [facility] 2/9 08/16/2018  Decreased Interest 3  Down, Depressed, Hopeless 3  PHQ - 2 Score 6  Altered sleeping 3  Tired, decreased energy 3  Change in appetite 3  Feeling bad or failure about yourself  2  Trouble concentrating 2  Moving slowly or fidgety/restless 3  Suicidal thoughts 0  PHQ-9 Score 22  Difficult doing work/chores Very difficult    ADLs:  In your present state of health, do you have any difficulty performing the following activities: 01/12/2018  Hearing? N  Vision? N  Difficulty concentrating or making decisions? N  Walking or climbing stairs? N  Dressing or bathing? N  Doing errands, shopping? N  Some recent data might be hidden     Cognitive Testing  Alert? Yes  Normal  Appearance?Yes  Oriented to person? Yes  Place? Yes   Time? Yes  Recall of three objects?  Yes  Can perform simple calculations? Yes  Displays appropriate judgment?Yes  Can read the correct time from a watch face?Yes  EOL planning:    Review of Systems  Constitutional: Negative for malaise/fatigue and weight loss.  HENT: Negative for hearing loss and tinnitus.   Eyes: Negative for blurred vision and double vision.  Respiratory: Negative for cough, sputum production, shortness of breath and wheezing.   Cardiovascular: Negative for chest pain, palpitations, orthopnea, claudication, leg swelling and PND.  Gastrointestinal: Negative for abdominal pain, blood in stool, constipation, diarrhea, heartburn, melena, nausea and vomiting.  Genitourinary: Negative.   Musculoskeletal: Negative for falls, joint pain and myalgias.  Skin: Negative for rash.  Neurological: Negative for dizziness, tingling, sensory change, weakness and headaches.  Endo/Heme/Allergies: Negative for polydipsia.  Psychiatric/Behavioral: Positive for depression. Negative for memory loss, substance abuse and suicidal ideas. The patient is not nervous/anxious and does not have insomnia.   All  other systems reviewed and are negative.    Objective:     There were no vitals filed for this visit. There is no height or weight on file to calculate BMI.  General appearance: alert, no distress, WD/WN, female HEENT: normocephalic, sclerae anicteric, TMs pearly, nares patent, no discharge or erythema, pharynx normal Oral cavity: MMM, no lesions Neck: supple, no lymphadenopathy, no thyromegaly, no masses Heart: RRR, normal S1, S2, no murmurs Lungs: CTA bilaterally, no wheezes, rhonchi, or rales Abdomen: +bs, soft, non tender, non distended, no masses, no hepatomegaly, no splenomegaly Musculoskeletal: nontender, no swelling, no obvious deformity Extremities: no edema, no cyanosis, no clubbing Pulses: 2+ symmetric, upper and  lower extremities, normal cap refill Neurological: alert, oriented x 3, CN2-12 intact, strength normal upper extremities and lower extremities, sensation normal throughout, DTRs 2+ throughout, no cerebellar signs, gait normal Psychiatric: normal affect, behavior normal, pleasant   EKG:  AAA [name]:   Medicare Attestation I have personally reviewed: The patient's medical and social history Their use of alcohol, tobacco or illicit drugs Their current medications and supplements The patient's functional ability including ADLs,fall risks, home safety risks, cognitive, and hearing and visual impairment Diet and physical activities Evidence for depression or mood disorders  The patient's weight, height, BMI, and visual acuity have been recorded in the chart.  I have made referrals, counseling, and provided education to the patient based on review of the above and I have provided the patient with a written personalized care plan for preventive services.     Izora Ribas, NP   10/27/2018

## 2018-10-29 ENCOUNTER — Ambulatory Visit: Payer: Self-pay | Admitting: Adult Health

## 2018-11-03 NOTE — Progress Notes (Deleted)
[facility]  PATIENT: Kimberly Ray DOB: 11-Mar-1951   REASON FOR VISIT: Follow-up for restless legs and obstructive sleep apnea with  CPAP  HISTORY FROM: Patient    HISTORY OF PRESENT ILLNESS:Flara R Friddle is a 68 y.o. female , seen here as in a referral/ revisit  from Dr. Melford Aase for a new evaluation of CPAP.   Mrs. Smither had been evaluated at the [facility] in the year 2012, she stated that she did not have follow-up was placed on a CPAP and was basically followed by the medical equipment company for supplies.  She had actually to sleep studies in the past the first one did not find enough apnea to be treated were the, but PLMS and restless legs were documented. In the meantime her sleep habits have changed and her concerns, also. 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 When she is on a " dead head" trip by plane she can sleep, in theory ,but has been asked by passengers if she had RLS ?   She carries a diagnosis of asthma, which has followed bronchitic infections in the past but is not frequent. She also takes methylphenidate for ADD which is really used for daytime sleepiness. She has carried a diagnosis of GERD continues to use a PPI, restless legs started getting on iron supplement, increased her walking.   Seasonal depression . Hep C ab positive. she will have regular screening labs with Vicie Mutters yearly. Labs were reviewed - normal HbA1c.   Sleep habits are as follows: The patient's intended bedtime is before 10 PM but due to her work requirements and residing between [place] and [place] she does have some irregularities. She can fall asleep within minutes she states, she can stay asleep for a period of several hours, and if she wouldn't have a bathroom break in between she would sleep uninterrupted sleep. She has no trouble sleeping for 12  hours if the opportunity arises. Her restless legs do not keep her from falling asleep. They are more PLMS evident while she sleeping. Her rise time is between 3 and 5 AM depending on which flight she has to attend to. 6 hours of nocturnal sleep. If she has some opportunity to sleep in daytime , she will do so and feels refreshed.  UPDATE 12/12/2018CM Ms. Zavaleta, 68 year old female returns for follow-up with a history of obstructive sleep apnea newly on CPAP.  In addition she has restless legs with and is on Requip.  She also relates that her father recently died of congestive heart failure the end of October.  CPAP compliance data dated 06/30/2017 to 07/29/2017 shows greater than 4 hours for 27 days and 90%.  Average usage 6 hours 47 minutes.  Set pressure 7 cm.  EPR level 1.  AHI 0.8.  She returns for reevaluation UPDATE 6/12/2019CM Ms. Jaspers, 68 year old female returns for follow-up with history of obstructive sleep apnea on CPAP.  She is having a problem with her mask, she has  not called the equipment company.  Her restless legs are well controlled on Requip.  CPAP data dated 12/28/2017-01/26/2018 shows compliance greater than 4 hours at 77%.  Average usage 5 hours 35 minutes.  Set pressure 7 cm.  EPR level 3 leak 95th percentile at 40.  AHI 3.3 ESS 15 fatigue severity scale 55.  She had her gallbladder removed in March and  a hysterectomy in May.  She returns for reevaluation UPDATE 12/19/2019CM Ms. Haun 68 year old female follow-up with history of obstructive sleep apnea on CPAP.  She also has a history of restless legs  well controlled on Requip when last seen however today she states they wake her up frequently.  Her CPAP compliance is 40% for 3 months however she had a trip to [place] and also was out of town.  She never takes her CPAP on trips.  She claims she has started on magnesium which is helped her restless legs recent hemoglobin hematocrit and iron studies by PCP were okay however she has not had a  recent ferritin level.  She recently retired and has had more anxiety.  She has a small lesion on her right chest which she says has suddenly appeared in the last couple of weeks.  It is irregular in shape and discolored.  She was advised to see a dermatologist.  She is also wanting to see a psychiatrist for her anxiety depression.  She returns for reevaluation REVIEW OF SYSTEMS: Full 14 system review of systems performed and notable only for those listed, all others are neg:  Constitutional: Fatigue Cardiovascular: neg Ear/Nose/Throat: neg  Skin: neg Eyes: neg Respiratory: Cough Gastroitestinal: neg Hematology/Lymphatic: Anemia Endocrine: neg Musculoskeletal: Joint pain Allergy/Immunology: Environmental allergies Neurological: neg Psychiatric: Depression, anxiety Sleep : Obstructive sleep apnea with CPAP, restless legs   ALLERGIES: Allergies  Allergen Reactions  . Erythromycin Other (See Comments)    TBS  . Iodinated Diagnostic Agents Other (See Comments)    Hot flashes, itching scalp Hot flashes, itching scalp  . Penicillins Hives  . Clarithromycin Other (See Comments)    Itching ankles  . Brintellix [Vortioxetine]     Itching  . Macrodantin [Nitrofurantoin]     HOME MEDICATIONS: Outpatient Medications Prior to Visit  Medication Sig Dispense Refill  . azelastine (ASTELIN) 0.1 % nasal spray Place 2 sprays into both nostrils 2 (two) times daily. Use in each nostril as directed (Patient not taking: Reported on 08/26/2018) 30 mL 2  . buPROPion (WELLBUTRIN XL) 300 MG 24 hr tablet Take 1 tablet (300 mg total) by mouth daily. 30 tablet 2  . DULoxetine (CYMBALTA) 60 MG capsule TAKE ONE CAPSULE BY MOUTH TWICE A DAY (Patient taking differently: Takes once a day. ** Do NOT chew, crush, or open capsule **) 180 capsule 0  . eletriptan (RELPAX) 20 MG tablet Take 1 tablet (20 mg total) by mouth as needed for migraine or headache. 1 po at onset of headache. May repeat in 2 hours if headache  persists or recurs. (Patient not taking: Reported on 08/26/2018) 24 tablet 3  . fenofibrate (TRICOR) 145 MG tablet Take 1 tablet Daily for Blood Fats (Triglycerides) 90 tablet 3  . lamoTRIgine (LAMICTAL) 100 MG tablet TAKE 1 TABLET BY MOUTH EVERY DAY 90 tablet 1  . methylphenidate (RITALIN) 10 MG tablet Take 1 tablet 1 to 2  x/ day for ADD & Mood 60 tablet 0  . ondansetron (ZOFRAN) 4 MG tablet Take 1 to 2 tablets 2 to 3 x / day if needed for Nausea 60 tablet 1  . ranitidine (ZANTAC) 300 MG tablet TAKE 1 TABLET BY MOUTH EVERY NIGHT FOR ACID REFLUX (Patient not taking: Reported on 08/26/2018) 90 tablet 1  . rOPINIRole (REQUIP) 1 MG tablet Take 3 tablets at Bedtime for Restless Legs 270 tablet 3   No facility-administered medications prior to visit.     PAST MEDICAL HISTORY: Past Medical History:  Diagnosis Date  . Anemia   . Depression   . GERD (gastroesophageal reflux disease)   . Hay fever   . Head trauma   . IBS (irritable bowel syndrome)   . Memory loss   . Migraines   . OSA on CPAP   . Reflux   . RLS (restless legs syndrome)   . Sinus congestion     PAST SURGICAL HISTORY: Past Surgical History:  Procedure Laterality Date  . BREAST BIOPSY Right 02/27/2014   Stereo- Benign  . CHOLECYSTECTOMY  11/03/2017   Dr. Rosendo Gros  . LAPAROSCOPIC HYSTERECTOMY  12/2017  . NASAL SINUS SURGERY    . perforated eardrum     left side  . TONSILLECTOMY AND ADENOIDECTOMY    . URETEROSCOPY     for ureteral stone    FAMILY HISTORY: Family History  Problem Relation Age of Onset  . Diabetes Mother   . Heart failure Father   . Diabetes Sister   . Breast cancer Maternal Grandmother 48  . Breast cancer Maternal Aunt 26  . Breast cancer Cousin   . Osteoporosis Brother   . Heart failure Maternal Grandfather 80  . Colon cancer Paternal Grandmother 76  . Stomach cancer Maternal Aunt     SOCIAL HISTORY: Social History   Socioeconomic History  . Marital status: Divorced    Spouse name: Not  on file  . Number of children: 2  . Years of education: 58  . 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 Financial resource strain: Not on file  . Food insecurity:    Worry: Not on file    Inability: Not on file  . Transportation needs:    Medical: Not on file    Non-medical: Not on file  Tobacco Use  . Smoking status: Never Smoker  . Smokeless tobacco: Never Used  Substance and Sexual Activity  . Alcohol use: Yes    Alcohol/week: 1.0 standard drinks    Types: 1 Shots of liquor per week    Comment: Once a year  . Drug use: No  . Sexual activity: Not Currently  Lifestyle  . Physical activity:    Days per week: 0 days    Minutes per session: 0 min  . Stress: Only a little  Relationships  . Social connections:    Talks on phone: Not on file    Gets together: Not on file    Attends religious service: Not on file    Active member of club or organization: Not on file    Attends meetings of clubs or organizations: Not on file    Relationship status: Not on file  . Intimate partner violence:    Fear of current or ex partner: Not on file    Emotionally abused: Not on file    Physically abused: Not on file    Forced sexual activity: Not on file  Other Topics Concern  . Not on file  Social History Narrative   Patient is single and lives at home alone. Patient is flight attendant. Patient has college education one year      Right handed.   Caffeine- Rare.      PHYSICAL EXAM  There were no vitals filed for this visit. There is no height or weight on file to calculate BMI.  Generalized: Well developed, in no acute distress  Head: normocephalic and atraumatic,. Oropharynx benign mallopatti  2 Neck: Supple, circumference 15 Lungs clear Musculoskeletal: No deformity  Skin no edema, skin lesion on right chest which is irregular and discolored, advised to see dermatologist  Neurological examination   Mentation: Alert oriented to time, place, history taking. Attention span and concentration appropriate. Recent and remote memory intact.  Follows all commands speech and language fluent.   Cranial nerve II-XII: .Pupils were equal round reactive to light extraocular movements were full, visual field were full on confrontational test. Facial sensation and strength were normal. hearing was intact to finger rubbing bilaterally. Uvula tongue midline. head turning and shoulder shrug were normal and symmetric.Tongue protrusion into cheek strength was normal. Motor: normal bulk and tone, full strength in the BUE, BLE,  Sensory: normal and symmetric to light touch, in the upper and lower extremities Coordination: finger-nose-finger, heel-to-shin bilaterally, no dysmetria Gait and Station: Rising up from seated position without assistance, normal stance,  moderate stride, good arm swing, smooth turning, able to perform tiptoe, and heel walking without difficulty. Tandem gait is steady  DIAGNOSTIC DATA (LABS, IMAGING, TESTING) - I reviewed patient records, labs, notes, testing and imaging myself where available.  Lab Results  Component Value Date   WBC 6.7 08/16/2018   HGB 13.9 08/16/2018   HCT 42.3 08/16/2018   MCV 85.8 08/16/2018   PLT 372 08/16/2018      Component Value Date/Time   NA 139 08/26/2018 1305   K 4.5 08/26/2018 1305   CL 102 08/26/2018 1305   CO2 26 08/26/2018 1305   GLUCOSE 82 08/26/2018 1305   BUN 24 08/26/2018 1305   CREATININE 1.01 (H) 08/26/2018 1305   CALCIUM 10.7 (H) 08/26/2018 1305   PROT 7.2 08/26/2018 1305   ALBUMIN 4.2 01/20/2017 1006   AST 41 (H) 08/26/2018 1305   ALT 31 (H) 08/26/2018 1305   ALKPHOS 86 01/20/2017 1006   BILITOT 0.4 08/26/2018 1305   GFRNONAA 57 (L) 08/26/2018 1305   GFRAA 66 08/26/2018 1305   Lab Results  Component Value Date   CHOL 224 (H) 08/16/2018   HDL 42 (L) 08/16/2018   LDLCALC 132 (H) 08/16/2018   TRIG  351 (H) 08/16/2018   CHOLHDL 5.3 (H) 08/16/2018   Lab Results  Component Value Date   HGBA1C 6.2 (H) 08/16/2018   Lab Results  Component Value Date   VITAMINB12 611 04/06/2018   Lab Results  Component Value Date   TSH 2.80 08/16/2018      ASSESSMENT AND PLAN  68 y.o. year old female  has a past medical history of Anemia, Depression, GERD (gastroesophageal reflux disease), Hay fever, Head trauma, IBS (irritable bowel syndrome), Memory loss, Migraines, OSA on CPAP, Reflux, RLS (restless legs syndrome), and Sinus congestion. here to follow-up for  CPAP compliance and restless legs.  CPAP compliance 04/11/2018-06/09/2018 shows usage days 24 out of 60 at 40%.  Greater than 4 hours at 30%.  Average usage 5 hours 27 minutes set pressure 7 cm AHI 1.5 ESS 22  CPAP compliance 40% she was advised to use her CPAP every night Continue same settings Continue  Requip for restless legs refilled by PCP Will order ferritin level Given name of a dermatologist to see Dr. Tonia Brooms with telephone number Given a name of a psychiatrist Dr. Casimiro Needle with telephone number Follow-up in 3 months Dennie Bible, [facility], [facility], [facility] [address], [place], [place] [address] [phone]

## 2018-11-04 ENCOUNTER — Ambulatory Visit: Payer: Medicare HMO | Admitting: Nurse Practitioner

## 2018-11-04 ENCOUNTER — Other Ambulatory Visit: Payer: Self-pay | Admitting: Internal Medicine

## 2018-11-04 DIAGNOSIS — G2581 Restless legs syndrome: Secondary | ICD-10-CM

## 2018-11-22 ENCOUNTER — Telehealth: Payer: Self-pay | Admitting: Neurology

## 2018-11-22 NOTE — Telephone Encounter (Signed)
I called the patient and left a message. I tried to pull a CPAP compliance report. She has a card on her machine she will have to bring in to download. Need to know if she wants to do so this week or we could push the appointment out a few weeks. Could also have the DME company fax [name] a compliance report. I asked her to call us back today.

## 2018-11-24 ENCOUNTER — Other Ambulatory Visit: Payer: Self-pay

## 2018-11-24 ENCOUNTER — Ambulatory Visit (INDEPENDENT_AMBULATORY_CARE_PROVIDER_SITE_OTHER): Payer: Medicare HMO | Admitting: Neurology

## 2018-11-24 DIAGNOSIS — Z0289 Encounter for other administrative examinations: Secondary | ICD-10-CM

## 2018-11-30 ENCOUNTER — Other Ambulatory Visit: Payer: Self-pay

## 2018-11-30 ENCOUNTER — Other Ambulatory Visit: Payer: Self-pay | Admitting: Adult Health

## 2018-11-30 DIAGNOSIS — E782 Mixed hyperlipidemia: Secondary | ICD-10-CM

## 2018-11-30 DIAGNOSIS — F319 Bipolar disorder, unspecified: Secondary | ICD-10-CM

## 2018-11-30 MED ORDER — FENOFIBRATE 145 MG PO TABS: ORAL_TABLET | ORAL | 3 refills | Status: DC

## 2018-11-30 MED ORDER — BUPROPION HCL ER (XL) 300 MG PO TB24: 300.0000 mg | ORAL_TABLET | Freq: Every day | ORAL | 2 refills | Status: DC

## 2018-11-30 MED ORDER — SUMATRIPTAN SUCCINATE 25 MG PO TABS: ORAL_TABLET | ORAL | 0 refills | Status: DC

## 2018-11-30 MED ORDER — LAMOTRIGINE 100 MG PO TABS: 100.0000 mg | ORAL_TABLET | Freq: Every day | ORAL | 1 refills | Status: DC

## 2018-11-30 NOTE — Telephone Encounter (Signed)
Refill request for Bupropion, Fenofibrate, Lamotrigine.   Patient usually takes Eletriptan for migraines but insurance doesn't cover, will you send in an alternative?

## 2018-12-02 ENCOUNTER — Other Ambulatory Visit: Payer: Self-pay

## 2018-12-02 DIAGNOSIS — E782 Mixed hyperlipidemia: Secondary | ICD-10-CM

## 2018-12-02 DIAGNOSIS — F319 Bipolar disorder, unspecified: Secondary | ICD-10-CM

## 2018-12-02 MED ORDER — FENOFIBRATE 145 MG PO TABS: ORAL_TABLET | ORAL | 3 refills | Status: DC

## 2018-12-02 MED ORDER — BUPROPION HCL ER (XL) 300 MG PO TB24: 300.0000 mg | ORAL_TABLET | Freq: Every day | ORAL | 2 refills | Status: DC

## 2018-12-02 MED ORDER — SUMATRIPTAN SUCCINATE 25 MG PO TABS: ORAL_TABLET | ORAL | 0 refills | Status: DC

## 2018-12-02 MED ORDER — LAMOTRIGINE 100 MG PO TABS: 100.0000 mg | ORAL_TABLET | Freq: Every day | ORAL | 1 refills | Status: DC

## 2018-12-02 NOTE — Telephone Encounter (Signed)
Refill request for Bupropion, Lamotrigine, Fenofibrate and Imitrex. Prior prescription send to [facility] and needs to go [facility] instead.

## 2019-02-15 NOTE — Progress Notes (Signed)
History of Present Illness:      This very nice 68 y.o.DWF  with labile HTN, HLD, T2_DM  and Vitamin D Deficiency presents for evaluation of suspected insect bite of her Left Lateral chest and Left buttock area.   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       Patient is followed expectantly for labile HTN & BP has been controlled at home. Today's BP is at goal - 122/72. Patient has had no complaints of any cardiac type chest pain, palpitations, dyspnea / orthopnea / PND, dizziness, claudication, or dependent edema.      Hyperlipidemia is controlled with diet & meds. Patient denies myalgias or other med SE's. Last Lipids were not at goal: Lab Results  Component Value Date   CHOL 224 (H) 08/16/2018   HDL 42 (L) 08/16/2018   LDLCALC 132 (H) 08/16/2018   TRIG 351 (H) 08/16/2018   CHOLHDL 5.3 (H) 08/16/2018       Also, the patient has history of  PreDiabetes (A1c 6.3% / 2011)  and then T2_NIDDM in 2014 and has been on Metformin since.  She has not had symptoms of reactive hypoglycemia, diabetic polys, paresthesias or visual blurring.  Last A1c was not at goal: Lab Results  Component Value Date   HGBA1C 6.2 (H) 08/16/2018      Further, the patient also has history of Vitamin D Deficiency  ("33" / 2012)  and supplements vitamin D without any suspected side-effects. Last vitamin D was at goal: Lab Results  Component Value Date   VD25OH 69 08/16/2018   Current Outpatient Medications on File Prior to Visit  Medication Sig  . buPROPion (WELLBUTRIN XL) 300 MG 24 hr tablet Take 1 tablet (300 mg total) by mouth daily.  . DULoxetine (CYMBALTA) 60 MG capsule TAKE ONE CAPSULE BY MOUTH TWICE A DAY (Patient taking differently: Takes once a day. ** Do NOT chew, crush, or open capsule **)  . lamoTRIgine (LAMICTAL) 100 MG tablet Take 1 tablet (100 mg total) by mouth daily.  . ondansetron (ZOFRAN) 4 MG tablet Take 1 to 2 tablets 2 to 3 x / day if needed for Nausea  .  ranitidine (ZANTAC) 300 MG tablet TAKE 1 TABLET BY MOUTH EVERY NIGHT FOR ACID REFLUX (Patient taking differently: as needed. TAKE 1 TABLET BY MOUTH EVERY NIGHT FOR ACID REFLUX)  . rOPINIRole (REQUIP) 1 MG tablet Take 1/2 to 1 or 2 tablets at night for Restless Legs  . SUMAtriptan (IMITREX) 25 MG tablet Take 1-2 tab with onset of migraine. May repeat in 2 hours if headache persists. Max 4 tabs in 24 hours.  Marland Kitchen azelastine (ASTELIN) 0.1 % nasal spray Place 2 sprays into both nostrils 2 (two) times daily. Use in each nostril as directed (Patient not taking: Reported on 08/26/2018)  . methylphenidate (RITALIN) 10 MG tablet Take 1 tablet 1 to 2  x/ day for ADD & Mood   No current facility-administered medications on file prior to visit.    Allergies  Allergen Reactions  . Erythromycin Other (See Comments)    TBS  . Iodinated Diagnostic Agents Other (See Comments)    Hot flashes, itching scalp Hot flashes, itching scalp  . Penicillins Hives  . Clarithromycin Other (See Comments)    Itching ankles  . Brintellix [Vortioxetine]     Itching  . Macrodantin [Nitrofurantoin]    PMHx:   Past Medical History:  Diagnosis Date  . Anemia   .  Depression   . GERD (gastroesophageal reflux disease)   . Hay fever   . Head trauma   . IBS (irritable bowel syndrome)   . Memory loss   . Migraines   . OSA on CPAP   . Reflux   . RLS (restless legs syndrome)   . Sinus congestion    Immunization History  Administered Date(s) Administered  . Influenza, High Dose Seasonal PF 06/12/2016, 05/13/2017  . Influenza-Unspecified 06/02/2015  . PPD Test 08/19/2011, 08/30/2013, 11/09/2014  . Pneumococcal Conjugate-13 08/18/2009  . Pneumococcal Polysaccharide-23 06/12/2016  . Td 08/19/2003  . Tdap 08/30/2013  . Zoster 08/18/2010   Past Surgical History:  Procedure Laterality Date  . BREAST BIOPSY Right 02/27/2014   Stereo- Benign  . CHOLECYSTECTOMY  11/03/2017   Dr. Rosendo Gros  . LAPAROSCOPIC HYSTERECTOMY   12/2017  . NASAL SINUS SURGERY    . perforated eardrum     left side  . TONSILLECTOMY AND ADENOIDECTOMY    . URETEROSCOPY     for ureteral stone   FHx:    Reviewed / unchanged  SHx:    Reviewed / unchanged   Systems Review:  Constitutional: Denies fever, chills, wt changes, headaches, insomnia, fatigue, night sweats, change in appetite. Eyes: Denies redness, blurred vision, diplopia, discharge, itchy, watery eyes.  ENT: Denies discharge, congestion, post nasal drip, epistaxis, sore throat, earache, hearing loss, dental pain, tinnitus, vertigo, sinus pain, snoring.  CV: Denies chest pain, palpitations, irregular heartbeat, syncope, dyspnea, diaphoresis, orthopnea, PND, claudication or edema. Respiratory: denies cough, dyspnea, DOE, pleurisy, hoarseness, laryngitis, wheezing.  Gastrointestinal: Denies dysphagia, odynophagia, heartburn, reflux, water brash, abdominal pain or cramps, nausea, vomiting, bloating, diarrhea, constipation, hematemesis, melena, hematochezia  or hemorrhoids. Genitourinary: Denies dysuria, frequency, urgency, nocturia, hesitancy, discharge, hematuria or flank pain. Musculoskeletal: Denies arthralgias, myalgias, stiffness, jt. swelling, pain, limping or strain/sprain.  Skin: Denies pruritus, rash, hives, warts, acne, eczema or change in skin lesion(s). Neuro: No weakness, tremor, incoordination, spasms, paresthesia or pain. Psychiatric: Denies confusion, memory loss or sensory loss. Endo: Denies change in weight, skin or hair change.  Heme/Lymph: No excessive bleeding, bruising or enlarged lymph nodes.  Physical Exam  BP 122/72   Pulse (!) 101   Temp (!) 97.5 F (36.4 C)   Ht 5\' 7"  (1.702 m)   Wt 192 lb (87.1 kg)   SpO2 95%   BMI 30.07 kg/m   Appears  well nourished, well groomed  and in no distress.  Eyes: PERRLA, EOMs, conjunctiva no swelling or erythema. Sinuses: No frontal/maxillary tenderness ENT/Mouth: EAC's clear, TM's nl w/o erythema, bulging.  Nares clear w/o erythema, swelling, exudates. Oropharynx clear without erythema or exudates. Oral hygiene is good. Tongue normal, non obstructing. Hearing intact.  Neck: Supple. Thyroid not palpable. Car 2+/2+ without bruits, nodes or JVD. Chest: Respirations nl with BS clear & equal w/o rales, rhonchi, wheezing or stridor.  Cor: Heart sounds normal w/ regular rate and rhythm without sig. murmurs, gallops, clicks or rubs. Peripheral pulses normal and equal  without edema.  Abdomen: Soft & bowel sounds normal. Non-tender w/o guarding, rebound, hernias, masses or organomegaly.  Lymphatics: Unremarkable.  Musculoskeletal: Full ROM all peripheral extremities, joint stability, 5/5 strength and normal gait.  Skin: Warm, dry without exposed rash or ecchymosis apparent.  There are 2 small hive -like slightly raised pink areas - 1 on the left cgest and the other on the lower Lt. buttock. Neither appear infected.  Neuro: Cranial nerves intact, reflexes equal bilaterally. Sensory-motor testing grossly intact. Tendon reflexes  grossly intact.  Pysch: Alert & oriented x 3.  Insight and judgement nl & appropriate. No ideations.  Assessment and Plan:  1. Insect bite of chest, left, initial encounter  - Reassured re: suspected insect bites and advised to monitor the areas for changes or drainage.   2. Attention deficit hyperactivity disorder (ADHD), predominantly inattentive type  Refill on schedule - amphetamine-dextroamphetamine (ADDERALL) 10 MG tablet; Take 1   To 1 &1/2  Tablets 1 or 2 x /day for Focus & Concentration  Dispense: 90 tablet; Refill: 0  3. Mixed hyperlipidemia  - Refill fenofibrate  - Continue diet, exercise  - Lifestyle modifications.        Discussed  regular exercise, BP monitoring, weight control to achieve/maintain BMI less than 25 and discussed med and SE's.   I discussed the assessment and treatment plan with the patient. The patient was provided an opportunity to ask questions  and all were answered. The patient agreed with the plan and demonstrated an understanding of the instructions.  I provided over 15 minutes of exam, counseling, chart review and decision making. The patient was advised to call back or seek an in-person evaluation if the symptoms worsen or if the condition fails to improve as anticipated.   Kirtland Bouchard, MD

## 2019-02-16 ENCOUNTER — Other Ambulatory Visit: Payer: Self-pay

## 2019-02-16 ENCOUNTER — Encounter: Payer: Self-pay | Admitting: Internal Medicine

## 2019-02-16 ENCOUNTER — Ambulatory Visit (INDEPENDENT_AMBULATORY_CARE_PROVIDER_SITE_OTHER): Payer: Medicare HMO | Admitting: Internal Medicine

## 2019-02-16 VITALS — BP 122/72 | HR 101 | Temp 97.5°F | Ht 67.0 in | Wt 192.0 lb

## 2019-02-16 DIAGNOSIS — E782 Mixed hyperlipidemia: Secondary | ICD-10-CM

## 2019-02-16 DIAGNOSIS — S20362A Insect bite (nonvenomous) of left front wall of thorax, initial encounter: Secondary | ICD-10-CM

## 2019-02-16 DIAGNOSIS — F900 Attention-deficit hyperactivity disorder, predominantly inattentive type: Secondary | ICD-10-CM

## 2019-02-16 DIAGNOSIS — R69 Illness, unspecified: Secondary | ICD-10-CM | POA: Diagnosis not present

## 2019-02-16 DIAGNOSIS — W57XXXA Bitten or stung by nonvenomous insect and other nonvenomous arthropods, initial encounter: Secondary | ICD-10-CM | POA: Diagnosis not present

## 2019-02-16 MED ORDER — AMPHETAMINE-DEXTROAMPHETAMINE 10 MG PO TABS: ORAL_TABLET | ORAL | 0 refills | Status: DC

## 2019-02-16 MED ORDER — FENOFIBRATE 145 MG PO TABS: ORAL_TABLET | ORAL | 3 refills | Status: DC

## 2019-02-18 DIAGNOSIS — R69 Illness, unspecified: Secondary | ICD-10-CM | POA: Diagnosis not present

## 2019-03-13 DIAGNOSIS — G2581 Restless legs syndrome: Secondary | ICD-10-CM | POA: Diagnosis not present

## 2019-03-13 DIAGNOSIS — W1830XA Fall on same level, unspecified, initial encounter: Secondary | ICD-10-CM | POA: Diagnosis not present

## 2019-03-13 DIAGNOSIS — X500XXA Overexertion from strenuous movement or load, initial encounter: Secondary | ICD-10-CM | POA: Diagnosis not present

## 2019-03-13 DIAGNOSIS — E559 Vitamin D deficiency, unspecified: Secondary | ICD-10-CM | POA: Diagnosis not present

## 2019-03-13 DIAGNOSIS — M25572 Pain in left ankle and joints of left foot: Secondary | ICD-10-CM | POA: Diagnosis not present

## 2019-03-13 DIAGNOSIS — S82432A Displaced oblique fracture of shaft of left fibula, initial encounter for closed fracture: Secondary | ICD-10-CM | POA: Diagnosis not present

## 2019-03-13 DIAGNOSIS — S82402A Unspecified fracture of shaft of left fibula, initial encounter for closed fracture: Secondary | ICD-10-CM | POA: Diagnosis not present

## 2019-03-13 DIAGNOSIS — I10 Essential (primary) hypertension: Secondary | ICD-10-CM | POA: Diagnosis not present

## 2019-03-13 DIAGNOSIS — S8252XA Displaced fracture of medial malleolus of left tibia, initial encounter for closed fracture: Secondary | ICD-10-CM | POA: Diagnosis not present

## 2019-03-13 DIAGNOSIS — K589 Irritable bowel syndrome without diarrhea: Secondary | ICD-10-CM | POA: Diagnosis not present

## 2019-03-13 DIAGNOSIS — Z01818 Encounter for other preprocedural examination: Secondary | ICD-10-CM | POA: Diagnosis not present

## 2019-03-13 DIAGNOSIS — S82842A Displaced bimalleolar fracture of left lower leg, initial encounter for closed fracture: Secondary | ICD-10-CM | POA: Diagnosis not present

## 2019-03-13 DIAGNOSIS — M8580 Other specified disorders of bone density and structure, unspecified site: Secondary | ICD-10-CM | POA: Diagnosis not present

## 2019-03-13 DIAGNOSIS — Z1159 Encounter for screening for other viral diseases: Secondary | ICD-10-CM | POA: Diagnosis not present

## 2019-03-13 DIAGNOSIS — Z9989 Dependence on other enabling machines and devices: Secondary | ICD-10-CM | POA: Diagnosis not present

## 2019-03-13 DIAGNOSIS — X501XXA Overexertion from prolonged static or awkward postures, initial encounter: Secondary | ICD-10-CM | POA: Diagnosis not present

## 2019-03-13 DIAGNOSIS — S8252XD Displaced fracture of medial malleolus of left tibia, subsequent encounter for closed fracture with routine healing: Secondary | ICD-10-CM | POA: Diagnosis not present

## 2019-03-13 DIAGNOSIS — S99912A Unspecified injury of left ankle, initial encounter: Secondary | ICD-10-CM | POA: Diagnosis not present

## 2019-03-13 DIAGNOSIS — N179 Acute kidney failure, unspecified: Secondary | ICD-10-CM | POA: Diagnosis not present

## 2019-03-13 DIAGNOSIS — S9302XA Subluxation of left ankle joint, initial encounter: Secondary | ICD-10-CM | POA: Diagnosis not present

## 2019-03-13 DIAGNOSIS — G4733 Obstructive sleep apnea (adult) (pediatric): Secondary | ICD-10-CM | POA: Diagnosis not present

## 2019-03-13 DIAGNOSIS — Z20828 Contact with and (suspected) exposure to other viral communicable diseases: Secondary | ICD-10-CM | POA: Diagnosis not present

## 2019-03-13 DIAGNOSIS — S9305XA Dislocation of left ankle joint, initial encounter: Secondary | ICD-10-CM | POA: Diagnosis not present

## 2019-03-13 DIAGNOSIS — S82892A Other fracture of left lower leg, initial encounter for closed fracture: Secondary | ICD-10-CM | POA: Diagnosis not present

## 2019-03-13 DIAGNOSIS — R69 Illness, unspecified: Secondary | ICD-10-CM | POA: Diagnosis not present

## 2019-03-13 DIAGNOSIS — S82392A Other fracture of lower end of left tibia, initial encounter for closed fracture: Secondary | ICD-10-CM | POA: Diagnosis not present

## 2019-03-13 DIAGNOSIS — S82852A Displaced trimalleolar fracture of left lower leg, initial encounter for closed fracture: Secondary | ICD-10-CM | POA: Diagnosis not present

## 2019-03-13 DIAGNOSIS — S82402D Unspecified fracture of shaft of left fibula, subsequent encounter for closed fracture with routine healing: Secondary | ICD-10-CM | POA: Diagnosis not present

## 2019-03-13 DIAGNOSIS — E782 Mixed hyperlipidemia: Secondary | ICD-10-CM | POA: Diagnosis not present

## 2019-03-14 DIAGNOSIS — E559 Vitamin D deficiency, unspecified: Secondary | ICD-10-CM | POA: Diagnosis not present

## 2019-03-14 DIAGNOSIS — M8580 Other specified disorders of bone density and structure, unspecified site: Secondary | ICD-10-CM | POA: Diagnosis not present

## 2019-03-14 DIAGNOSIS — K589 Irritable bowel syndrome without diarrhea: Secondary | ICD-10-CM | POA: Diagnosis not present

## 2019-03-14 DIAGNOSIS — S8252XD Displaced fracture of medial malleolus of left tibia, subsequent encounter for closed fracture with routine healing: Secondary | ICD-10-CM | POA: Diagnosis not present

## 2019-03-14 DIAGNOSIS — I10 Essential (primary) hypertension: Secondary | ICD-10-CM | POA: Diagnosis not present

## 2019-03-14 DIAGNOSIS — E782 Mixed hyperlipidemia: Secondary | ICD-10-CM | POA: Diagnosis not present

## 2019-03-14 DIAGNOSIS — S82852A Displaced trimalleolar fracture of left lower leg, initial encounter for closed fracture: Secondary | ICD-10-CM | POA: Diagnosis not present

## 2019-03-14 DIAGNOSIS — S82402D Unspecified fracture of shaft of left fibula, subsequent encounter for closed fracture with routine healing: Secondary | ICD-10-CM | POA: Diagnosis not present

## 2019-03-14 DIAGNOSIS — R69 Illness, unspecified: Secondary | ICD-10-CM | POA: Diagnosis not present

## 2019-03-14 DIAGNOSIS — Z9989 Dependence on other enabling machines and devices: Secondary | ICD-10-CM | POA: Diagnosis not present

## 2019-03-14 DIAGNOSIS — G4733 Obstructive sleep apnea (adult) (pediatric): Secondary | ICD-10-CM | POA: Diagnosis not present

## 2019-03-14 DIAGNOSIS — G2581 Restless legs syndrome: Secondary | ICD-10-CM | POA: Diagnosis not present

## 2019-03-14 DIAGNOSIS — Z1159 Encounter for screening for other viral diseases: Secondary | ICD-10-CM | POA: Diagnosis not present

## 2019-03-14 DIAGNOSIS — N179 Acute kidney failure, unspecified: Secondary | ICD-10-CM | POA: Diagnosis not present

## 2019-03-14 HISTORY — PX: ORIF ANKLE FRACTURE BIMALLEOLAR: SUR920

## 2019-03-15 DIAGNOSIS — G4733 Obstructive sleep apnea (adult) (pediatric): Secondary | ICD-10-CM | POA: Diagnosis not present

## 2019-03-15 DIAGNOSIS — Z9989 Dependence on other enabling machines and devices: Secondary | ICD-10-CM | POA: Diagnosis not present

## 2019-03-15 DIAGNOSIS — S82892A Other fracture of left lower leg, initial encounter for closed fracture: Secondary | ICD-10-CM | POA: Diagnosis not present

## 2019-03-16 DIAGNOSIS — Z9989 Dependence on other enabling machines and devices: Secondary | ICD-10-CM | POA: Diagnosis not present

## 2019-03-16 DIAGNOSIS — G4733 Obstructive sleep apnea (adult) (pediatric): Secondary | ICD-10-CM | POA: Diagnosis not present

## 2019-03-16 DIAGNOSIS — I10 Essential (primary) hypertension: Secondary | ICD-10-CM | POA: Diagnosis not present

## 2019-03-16 DIAGNOSIS — S82892A Other fracture of left lower leg, initial encounter for closed fracture: Secondary | ICD-10-CM | POA: Diagnosis not present

## 2019-03-17 ENCOUNTER — Encounter: Payer: Self-pay | Admitting: Adult Health

## 2019-03-17 DIAGNOSIS — S82892A Other fracture of left lower leg, initial encounter for closed fracture: Secondary | ICD-10-CM | POA: Diagnosis not present

## 2019-03-21 ENCOUNTER — Other Ambulatory Visit: Payer: Self-pay | Admitting: Adult Health

## 2019-03-22 DIAGNOSIS — G4733 Obstructive sleep apnea (adult) (pediatric): Secondary | ICD-10-CM | POA: Diagnosis not present

## 2019-03-22 DIAGNOSIS — W19XXXD Unspecified fall, subsequent encounter: Secondary | ICD-10-CM | POA: Diagnosis not present

## 2019-03-22 DIAGNOSIS — Z7409 Other reduced mobility: Secondary | ICD-10-CM | POA: Diagnosis not present

## 2019-03-22 DIAGNOSIS — Z9989 Dependence on other enabling machines and devices: Secondary | ICD-10-CM | POA: Diagnosis not present

## 2019-03-22 DIAGNOSIS — Z7982 Long term (current) use of aspirin: Secondary | ICD-10-CM | POA: Diagnosis not present

## 2019-03-22 DIAGNOSIS — E119 Type 2 diabetes mellitus without complications: Secondary | ICD-10-CM | POA: Diagnosis not present

## 2019-03-22 DIAGNOSIS — Z7901 Long term (current) use of anticoagulants: Secondary | ICD-10-CM | POA: Diagnosis not present

## 2019-03-22 DIAGNOSIS — I10 Essential (primary) hypertension: Secondary | ICD-10-CM | POA: Diagnosis not present

## 2019-03-22 DIAGNOSIS — S82852D Displaced trimalleolar fracture of left lower leg, subsequent encounter for closed fracture with routine healing: Secondary | ICD-10-CM | POA: Diagnosis not present

## 2019-03-25 DIAGNOSIS — G4733 Obstructive sleep apnea (adult) (pediatric): Secondary | ICD-10-CM | POA: Diagnosis not present

## 2019-03-25 DIAGNOSIS — Z7409 Other reduced mobility: Secondary | ICD-10-CM | POA: Diagnosis not present

## 2019-03-25 DIAGNOSIS — E119 Type 2 diabetes mellitus without complications: Secondary | ICD-10-CM | POA: Diagnosis not present

## 2019-03-25 DIAGNOSIS — Z9989 Dependence on other enabling machines and devices: Secondary | ICD-10-CM | POA: Diagnosis not present

## 2019-03-25 DIAGNOSIS — W19XXXD Unspecified fall, subsequent encounter: Secondary | ICD-10-CM | POA: Diagnosis not present

## 2019-03-25 DIAGNOSIS — Z7901 Long term (current) use of anticoagulants: Secondary | ICD-10-CM | POA: Diagnosis not present

## 2019-03-25 DIAGNOSIS — I10 Essential (primary) hypertension: Secondary | ICD-10-CM | POA: Diagnosis not present

## 2019-03-25 DIAGNOSIS — S82852D Displaced trimalleolar fracture of left lower leg, subsequent encounter for closed fracture with routine healing: Secondary | ICD-10-CM | POA: Diagnosis not present

## 2019-03-29 DIAGNOSIS — W19XXXD Unspecified fall, subsequent encounter: Secondary | ICD-10-CM | POA: Diagnosis not present

## 2019-03-29 DIAGNOSIS — Z7409 Other reduced mobility: Secondary | ICD-10-CM | POA: Diagnosis not present

## 2019-03-29 DIAGNOSIS — Z9989 Dependence on other enabling machines and devices: Secondary | ICD-10-CM | POA: Diagnosis not present

## 2019-03-29 DIAGNOSIS — G4733 Obstructive sleep apnea (adult) (pediatric): Secondary | ICD-10-CM | POA: Diagnosis not present

## 2019-03-29 DIAGNOSIS — I10 Essential (primary) hypertension: Secondary | ICD-10-CM | POA: Diagnosis not present

## 2019-03-29 DIAGNOSIS — S82852D Displaced trimalleolar fracture of left lower leg, subsequent encounter for closed fracture with routine healing: Secondary | ICD-10-CM | POA: Diagnosis not present

## 2019-03-29 DIAGNOSIS — Z7901 Long term (current) use of anticoagulants: Secondary | ICD-10-CM | POA: Diagnosis not present

## 2019-03-29 DIAGNOSIS — E119 Type 2 diabetes mellitus without complications: Secondary | ICD-10-CM | POA: Diagnosis not present

## 2019-03-31 DIAGNOSIS — S82892A Other fracture of left lower leg, initial encounter for closed fracture: Secondary | ICD-10-CM | POA: Diagnosis not present

## 2019-03-31 DIAGNOSIS — S82852D Displaced trimalleolar fracture of left lower leg, subsequent encounter for closed fracture with routine healing: Secondary | ICD-10-CM | POA: Diagnosis not present

## 2019-04-01 DIAGNOSIS — Z9989 Dependence on other enabling machines and devices: Secondary | ICD-10-CM | POA: Diagnosis not present

## 2019-04-01 DIAGNOSIS — W19XXXD Unspecified fall, subsequent encounter: Secondary | ICD-10-CM | POA: Diagnosis not present

## 2019-04-01 DIAGNOSIS — E119 Type 2 diabetes mellitus without complications: Secondary | ICD-10-CM | POA: Diagnosis not present

## 2019-04-01 DIAGNOSIS — Z7409 Other reduced mobility: Secondary | ICD-10-CM | POA: Diagnosis not present

## 2019-04-01 DIAGNOSIS — I10 Essential (primary) hypertension: Secondary | ICD-10-CM | POA: Diagnosis not present

## 2019-04-01 DIAGNOSIS — G4733 Obstructive sleep apnea (adult) (pediatric): Secondary | ICD-10-CM | POA: Diagnosis not present

## 2019-04-01 DIAGNOSIS — Z7901 Long term (current) use of anticoagulants: Secondary | ICD-10-CM | POA: Diagnosis not present

## 2019-04-01 DIAGNOSIS — S82852D Displaced trimalleolar fracture of left lower leg, subsequent encounter for closed fracture with routine healing: Secondary | ICD-10-CM | POA: Diagnosis not present

## 2019-04-04 DIAGNOSIS — Z7901 Long term (current) use of anticoagulants: Secondary | ICD-10-CM | POA: Diagnosis not present

## 2019-04-04 DIAGNOSIS — Z9989 Dependence on other enabling machines and devices: Secondary | ICD-10-CM | POA: Diagnosis not present

## 2019-04-04 DIAGNOSIS — I10 Essential (primary) hypertension: Secondary | ICD-10-CM | POA: Diagnosis not present

## 2019-04-04 DIAGNOSIS — E119 Type 2 diabetes mellitus without complications: Secondary | ICD-10-CM | POA: Diagnosis not present

## 2019-04-04 DIAGNOSIS — S82852D Displaced trimalleolar fracture of left lower leg, subsequent encounter for closed fracture with routine healing: Secondary | ICD-10-CM | POA: Diagnosis not present

## 2019-04-04 DIAGNOSIS — G4733 Obstructive sleep apnea (adult) (pediatric): Secondary | ICD-10-CM | POA: Diagnosis not present

## 2019-04-04 DIAGNOSIS — Z7409 Other reduced mobility: Secondary | ICD-10-CM | POA: Diagnosis not present

## 2019-04-04 DIAGNOSIS — W19XXXD Unspecified fall, subsequent encounter: Secondary | ICD-10-CM | POA: Diagnosis not present

## 2019-04-11 DIAGNOSIS — Z7409 Other reduced mobility: Secondary | ICD-10-CM | POA: Diagnosis not present

## 2019-04-11 DIAGNOSIS — I10 Essential (primary) hypertension: Secondary | ICD-10-CM | POA: Diagnosis not present

## 2019-04-11 DIAGNOSIS — W19XXXD Unspecified fall, subsequent encounter: Secondary | ICD-10-CM | POA: Diagnosis not present

## 2019-04-11 DIAGNOSIS — S82852D Displaced trimalleolar fracture of left lower leg, subsequent encounter for closed fracture with routine healing: Secondary | ICD-10-CM | POA: Diagnosis not present

## 2019-04-11 DIAGNOSIS — G4733 Obstructive sleep apnea (adult) (pediatric): Secondary | ICD-10-CM | POA: Diagnosis not present

## 2019-04-11 DIAGNOSIS — N183 Chronic kidney disease, stage 3 unspecified: Secondary | ICD-10-CM | POA: Insufficient documentation

## 2019-04-11 DIAGNOSIS — Z9989 Dependence on other enabling machines and devices: Secondary | ICD-10-CM | POA: Diagnosis not present

## 2019-04-11 DIAGNOSIS — E119 Type 2 diabetes mellitus without complications: Secondary | ICD-10-CM | POA: Diagnosis not present

## 2019-04-11 DIAGNOSIS — Z7901 Long term (current) use of anticoagulants: Secondary | ICD-10-CM | POA: Diagnosis not present

## 2019-04-11 NOTE — Progress Notes (Signed)
Complete Physical  Assessment and Plan:   Encounter for general adult medical examination with abnormal findings  Hyperlipidemia -cont diet and exercise - Lipid panel   Hyperglycemia Discussed general issues about diabetes pathophysiology and management., Educational material distributed., Suggested low cholesterol diet., Encouraged aerobic exercise., Discussed foot care., Reminded to get yearly retinal exam.  Anemia, unspecified - monitor, continue iron supp with Vitamin C and increase green leafy veggies - Iron and TIBC reflex if indicated by CBC - Vitamin B12 has been normal - defer   Essential hypertension - continue medications, DASH diet, exercise and monitor at home. Call if greater than 130/80.  - Urinalysis, Routine w reflex microscopic (not at [facility]) - Microalbumin / creatinine urine ratio - TSH   Vitamin D deficiency - VITAMIN D 25 Hydroxy (Vit-D Deficiency, Fractures)  Osteopenia - get dexa q2y, due 2022, continue Vit D and Ca, weight bearing exercises  ADD (attention deficit disorder) Continue medications:  Helps with focus, no AE's. The patient was counseled on the addictive nature of the medication and was encouraged to take drug holidays when not needed.   CKD 3 Increase fluids, avoid NSAIDS, monitor sugars, will monitor  Fatty liver Weight loss advised, avoid alcohol/tylenol, will monitor LFTs  OSA on CPAP Endorses 100 % compliance  Extrinsic asthma without complication, unspecified asthma severity, unspecified whether persistent Avoid triggers  Gastroesophageal reflux disease, esophagitis presence not specified Famotidine sent in  Discussed diet, avoiding triggers and other lifestyle changes  RLS (restless legs syndrome) Monitor iron, magnesium, increase walking if not getting better can try going back to neuro  Bipolar depression ([place]) Continue medications She will schedule with psych due to ongoing poor control of depression following  retirement and with covid 19   Overweight Long discussion about weight loss, diet, and exercise Recommended diet heavy in fruits and veggies and low in animal meats, cheeses, and dairy products, appropriate calorie intake Discussed appropriate weight for height  Follow up at next visit    Discussed med's effects and SE's. Screening labs and tests as requested with regular follow-up as recommended. Future Appointments  Date Time Provider [place]  04/11/2020 10:00 AM Liane Comber, NP [facility] None     HPI  68 y.o. female  presents for a complete physical. She has Mixed hyperlipidemia; ADD (attention deficit disorder); OSA on CPAP; Bipolar depression ([place]); Other abnormal glucose (prediabetes); Vitamin D deficiency; GERD ; Extrinsic asthma; Medication management; Overweight (BMI 25.0-29.9); Migraines; Circadian rhythm sleep disorder; Periodic limb movement disorder (PLMD); Fatty liver; Osteopenia; and CKD (chronic kidney disease) stage 3, GFR 30-59 ml/min (HCC) on their problem list.   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   She had left ankle ORIF via [facility] in 02/2019 after tripping down a step going to a pool and in boot and wheelchair; following with Dr. Sheran Lawless. Doing PT at home. Mom is helping take care of her at home.   last year she experienced self-limiting uterine bleeding, workup by GYN demonstrated an endometrial stripe of 4 mm, an ultrasound that showed a 10 mm irregular cyst and laparoscopic findings notable for an adnexa with excrescences.  CA125 was 6.  She underwent hysterectomy bilateral salpingo-oophorectomy and a minimally invasive fashion on Dec 23, 2017.   She has long hx of severe depression and is on lamictal, wellbutrin, cymbalta and ADD medication and remains poorly controlled; worse since retirement and with covid 19; after discussion she is planning to schedule an appointment with psychiatry fur further management.  She is currently  off of reflux med due to ranitidine recall; having reflux sx, dry heaving; will send in famotidine  She has RLS symptoms, currently fairly managed by requip 1-2 mg nightly  BMI is There is no height or weight on file to calculate BMI., she has not been working on diet and exercise. She has OSA sleep study 3 years ago, now on CPAP.   Wt Readings from Last 3 Encounters:  02/16/19 192 lb (87.1 kg)  08/26/18 187 lb (84.8 kg)  08/16/18 187 lb (84.8 kg)   Her blood pressure has been controlled at home, today their BP is BP: 110/74.   She does not workout. She denies chest pain, shortness of breath, dizziness.   She is on cholesterol medication (fenofibrate) and denies myalgias. She was on pravastatin previously and reported arthralgias that resolved with cessation.  Her cholesterol is not at goal. The cholesterol last visit was:  Lab Results  Component Value Date   CHOL 224 (H) 08/16/2018   HDL 42 (L) 08/16/2018   LDLCALC 132 (H) 08/16/2018   TRIG 351 (H) 08/16/2018   CHOLHDL 5.3 (H) 08/16/2018  . She has been working on diet and exercise for glucose management, Last A1C in the office was:  Lab Results  Component Value Date   HGBA1C 6.2 (H) 08/16/2018     Lab Results  Component Value Date   GFRNONAA 57 (L) 08/26/2018   Patient is on Vitamin D supplement, taking 8000 IU daily   Lab Results  Component Value Date   VD25OH 69 08/16/2018       Current Medications:  Current Outpatient Medications on File Prior to Visit  Medication Sig  . buPROPion (WELLBUTRIN XL) 300 MG 24 hr tablet TAKE ONE TABLET BY MOUTH DAILY  . DULoxetine (CYMBALTA) 60 MG capsule TAKE ONE CAPSULE BY MOUTH TWICE A DAY (Patient taking differently: Takes once a day. ** Do NOT chew, crush, or open capsule **)  . fenofibrate (TRICOR) 145 MG tablet Take 1 tablet Daily for Blood Fats (Triglycerides)  . lamoTRIgine (LAMICTAL) 100 MG tablet Take 1 tablet (100 mg total) by mouth daily.  . ondansetron (ZOFRAN) 4 MG  tablet Take 1 to 2 tablets 2 to 3 x / day if needed for Nausea  . rOPINIRole (REQUIP) 1 MG tablet Take 1/2 to 1 or 2 tablets at night for Restless Legs  . SUMAtriptan (IMITREX) 25 MG tablet Take 1-2 tab with onset of migraine. May repeat in 2 hours if headache persists. Max 4 tabs in 24 hours.  Marland Kitchen amphetamine-dextroamphetamine (ADDERALL) 10 MG tablet Take 1   To 1 &1/2  Tablets 1 or 2 x /day for Focus & Concentration (Patient not taking: Reported on 04/12/2019)  . azelastine (ASTELIN) 0.1 % nasal spray Place 2 sprays into both nostrils 2 (two) times daily. Use in each nostril as directed (Patient not taking: Reported on 08/26/2018)   No current facility-administered medications on file prior to visit.     Health Maintenance:   Immunization History  Administered Date(s) Administered  . Influenza, High Dose Seasonal PF 06/12/2016, 05/13/2017  . Influenza-Unspecified 06/02/2015  . PPD Test 08/19/2011, 08/30/2013, 11/09/2014  . Pneumococcal Conjugate-13 08/18/2009, 02/18/2019  . Pneumococcal Polysaccharide-23 06/12/2016  . Td 08/19/2003  . Tdap 08/30/2013  . Zoster 08/18/2010    Tetanus: 2015 Pneumovax: 2017 Prevnar: 2011, 2020 Flu vaccine: 2018  Zostavax: 2012  PAP: 2019, s/p TAH, done  MGM: 12/2017 patient will schedule DEXA: 08/2018 - L fem T -  2.0  Colonoscopy: 09/2011 EGD: 2019  Last Dental Exam: Dr. Tama Gander, twice yearly  Last Eye Exam: Dr. Trish Fountain office, yearly Last Derm Exam: few years ago  Patient Care Team: Unk Pinto, MD as PCP - General (Internal Medicine) Clarene Essex, MD as Consulting Physician (Gastroenterology) Royston Sinner Colin Benton, MD as Consulting Physician (Obstetrics and Gynecology)   Medical History:  Past Medical History:  Diagnosis Date  . Anemia   . Depression   . GERD (gastroesophageal reflux disease)   . Hay fever   . Head trauma   . IBS (irritable bowel syndrome)   . Memory loss   . Migraines   . OSA on CPAP   . Reflux   . RLS (restless  legs syndrome)   . Sinus congestion     Allergies Allergies  Allergen Reactions  . Erythromycin Other (See Comments)    TBS  . Iodinated Diagnostic Agents Other (See Comments)    Hot flashes, itching scalp Hot flashes, itching scalp  . Penicillins Hives  . Clarithromycin Other (See Comments)    Itching ankles  . Brintellix [Vortioxetine]     Itching  . Macrodantin [Nitrofurantoin]     SURGICAL HISTORY She  has a past surgical history that includes perforated eardrum; Nasal sinus surgery; Tonsillectomy and adenoidectomy; Breast biopsy (Right, 02/27/2014); Cholecystectomy (11/03/2017); Laparoscopic hysterectomy (12/2017); Ureteroscopy; and ORIF ankle fracture bimalleolar (Left, 03/14/2019). FAMILY HISTORY Her family history includes Breast cancer in her cousin; Breast cancer (age of onset: 5) in her maternal aunt; Breast cancer (age of onset: 27) in her maternal grandmother; Colon cancer (age of onset: 9) in her paternal grandmother; Diabetes in her mother and sister; Heart failure in her father; Heart failure (age of onset: 22) in her maternal grandfather; Osteoporosis in her brother; Stomach cancer in her maternal aunt. SOCIAL HISTORY She  reports that she has never smoked. She has never used smokeless tobacco. She reports current alcohol use of about 1.0 standard drinks of alcohol per week. She reports that she does not use drugs.  Review of Systems: Review of Systems  Constitutional: Negative for chills, fever, malaise/fatigue and weight loss.  HENT: Negative for congestion, ear pain, hearing loss, sore throat and tinnitus.   Eyes: Negative for blurred vision and double vision.  Respiratory: Negative for cough, sputum production, shortness of breath and wheezing.   Cardiovascular: Negative for chest pain, palpitations, orthopnea, claudication, leg swelling and PND.  Gastrointestinal: Negative for abdominal pain, blood in stool, constipation, diarrhea, heartburn, melena, nausea  and vomiting.  Genitourinary: Negative.   Musculoskeletal: Negative for falls, joint pain and myalgias.  Skin: Negative for rash.  Neurological: Negative for dizziness, tingling, sensory change, loss of consciousness, weakness and headaches (occasional migraines).  Endo/Heme/Allergies: Negative for polydipsia.  Psychiatric/Behavioral: Negative for depression, memory loss, substance abuse and suicidal ideas. The patient is not nervous/anxious and does not have insomnia.   All other systems reviewed and are negative.   Physical Exam: Estimated body mass index is 30.07 kg/m as calculated from the following:   Height as of 02/16/19: 5\' 7"  (1.702 m).   Weight as of 02/16/19: 192 lb (87.1 kg). BP 110/74   Pulse 88   Temp (!) 97.2 F (36.2 C)   SpO2 98%   Wt Readings from Last 3 Encounters:  02/16/19 192 lb (87.1 kg)  08/26/18 187 lb (84.8 kg)  08/16/18 187 lb (84.8 kg)    General Appearance: Well nourished well developed, in no apparent distress.  Eyes: PERRLA, EOMs, conjunctiva  no swelling or erythema ENT/Mouth: Ear canals normal without obstruction, swelling, erythema, or discharge.  TMs normal bilaterally with no erythema, bulging, retraction, or loss of landmark.  Oropharynx moist and clear with no exudate, erythema, or swelling.   Neck: Supple, thyroid normal. No bruits.  No cervical adenopathy Respiratory: Respiratory effort normal, Breath sounds clear A&P without wheeze, rhonchi, rales.   Cardio: RRR without murmurs, rubs or gallops. Brisk peripheral pulses without edema.  Chest: symmetric, with normal excursions Breasts: Patient requests defer today Abdomen: Soft, nontender, no guarding, rebound, hernias, masses, or organomegaly.  Lymphatics: Non tender without lymphadenopathy.   Musculoskeletal: No obvious deformity; L ankle in boot, in wheelchair Skin: Warm, dry without rashes, lesions, ecchymosis. GU: defer Neuro: Awake and oriented X 3, Cranial nerves intact, reflexes equal  bilaterally upper extremities; Normal muscle tone, no cerebellar symptoms. Sensation intact.  Psych:  normal affect, Insight and Judgment appropriate.   EKG: Just had with surgery 02/2019; defer  Over 40 minutes of exam, counseling, chart review and critical decision making was performed  Izora Ribas 10:39 AM [facility]

## 2019-04-12 ENCOUNTER — Other Ambulatory Visit: Payer: Self-pay

## 2019-04-12 ENCOUNTER — Ambulatory Visit (INDEPENDENT_AMBULATORY_CARE_PROVIDER_SITE_OTHER): Payer: Medicare HMO | Admitting: Adult Health

## 2019-04-12 ENCOUNTER — Encounter: Payer: Self-pay | Admitting: Adult Health

## 2019-04-12 VITALS — BP 110/74 | HR 88 | Temp 97.2°F

## 2019-04-12 DIAGNOSIS — R7309 Other abnormal glucose: Secondary | ICD-10-CM

## 2019-04-12 DIAGNOSIS — Z79899 Other long term (current) drug therapy: Secondary | ICD-10-CM | POA: Diagnosis not present

## 2019-04-12 DIAGNOSIS — E782 Mixed hyperlipidemia: Secondary | ICD-10-CM

## 2019-04-12 DIAGNOSIS — Z0000 Encounter for general adult medical examination without abnormal findings: Secondary | ICD-10-CM

## 2019-04-12 DIAGNOSIS — Z136 Encounter for screening for cardiovascular disorders: Secondary | ICD-10-CM

## 2019-04-12 DIAGNOSIS — Z0001 Encounter for general adult medical examination with abnormal findings: Secondary | ICD-10-CM

## 2019-04-12 DIAGNOSIS — G2581 Restless legs syndrome: Secondary | ICD-10-CM

## 2019-04-12 DIAGNOSIS — G4720 Circadian rhythm sleep disorder, unspecified type: Secondary | ICD-10-CM

## 2019-04-12 DIAGNOSIS — G43809 Other migraine, not intractable, without status migrainosus: Secondary | ICD-10-CM

## 2019-04-12 DIAGNOSIS — J45909 Unspecified asthma, uncomplicated: Secondary | ICD-10-CM

## 2019-04-12 DIAGNOSIS — E663 Overweight: Secondary | ICD-10-CM

## 2019-04-12 DIAGNOSIS — K760 Fatty (change of) liver, not elsewhere classified: Secondary | ICD-10-CM

## 2019-04-12 DIAGNOSIS — N183 Chronic kidney disease, stage 3 unspecified: Secondary | ICD-10-CM

## 2019-04-12 DIAGNOSIS — M8580 Other specified disorders of bone density and structure, unspecified site: Secondary | ICD-10-CM

## 2019-04-12 DIAGNOSIS — E559 Vitamin D deficiency, unspecified: Secondary | ICD-10-CM

## 2019-04-12 DIAGNOSIS — F988 Other specified behavioral and emotional disorders with onset usually occurring in childhood and adolescence: Secondary | ICD-10-CM

## 2019-04-12 DIAGNOSIS — G4761 Periodic limb movement disorder: Secondary | ICD-10-CM

## 2019-04-12 DIAGNOSIS — D509 Iron deficiency anemia, unspecified: Secondary | ICD-10-CM | POA: Diagnosis not present

## 2019-04-12 DIAGNOSIS — K219 Gastro-esophageal reflux disease without esophagitis: Secondary | ICD-10-CM

## 2019-04-12 DIAGNOSIS — F319 Bipolar disorder, unspecified: Secondary | ICD-10-CM

## 2019-04-12 DIAGNOSIS — G4733 Obstructive sleep apnea (adult) (pediatric): Secondary | ICD-10-CM

## 2019-04-12 MED ORDER — FAMOTIDINE 20 MG PO TABS: 20.0000 mg | ORAL_TABLET | Freq: Two times a day (BID) | ORAL | 1 refills | Status: DC | PRN

## 2019-04-12 MED ORDER — BUPROPION HCL ER (XL) 300 MG PO TB24: 300.0000 mg | ORAL_TABLET | Freq: Every day | ORAL | 1 refills | Status: DC

## 2019-04-12 NOTE — Patient Instructions (Addendum)
  Kimberly Ray , Thank you for taking time to come for your Annual Wellness Visit. I appreciate your ongoing commitment to your health goals. Please review the following plan we discussed and let me know if I can assist you in the future.   These are the goals we discussed: Goals    . Weight (lb) < 160 lb (72.6 kg)       This is a list of the screening recommended for you and due dates:  Health Maintenance  Topic Date Due  . Flu Shot  03/19/2019  . Urine Protein Check  04/07/2019  . Mammogram  01/06/2020  . Colon Cancer Screening  10/12/2021  . Tetanus Vaccine  08/31/2023  . DEXA scan (bone density measurement)  Completed  .  Hepatitis C: One time screening is recommended by Center for Disease Control  (CDC) for  adults born from [birth year] through 1966.   Completed  . Pneumonia vaccines  Completed      Consider Dr. Casimiro Needle for psychiatry - takes medicare insurance   [place]  7 a.m.-6:30 p.m., Monday 7 a.m.-5 p.m., Tuesday-Friday Schedule an appointment by calling [phone].  [facility] Schedule an appointment by calling [phone].    Know what a healthy weight is for you (roughly BMI <25) and aim to maintain this  Aim for 7+ servings of fruits and vegetables daily  65-80+ fluid ounces of water or unsweet tea for healthy kidneys  Limit to max 1 drink of alcohol per day; avoid smoking/tobacco  Limit animal fats in diet for cholesterol and heart health - choose grass fed whenever available  Avoid highly processed foods, and foods high in saturated/trans fats  Aim for low stress - take time to unwind and care for your mental health  Aim for 150 min of moderate intensity exercise weekly for heart health, and weights twice weekly for bone health  Aim for 7-9 hours of sleep daily        When it comes to diets, agreement about the perfect plan isn't easy to find, even among the experts. Experts  at the [employer] developed an idea known as the Healthy Eating Plate. Just imagine a plate divided into logical, healthy portions.  The emphasis is on diet quality:  Load up on vegetables and fruits - one-half of your plate: Aim for color and variety, and remember that potatoes don't count.  Go for whole grains - one-quarter of your plate: Whole wheat, barley, wheat berries, quinoa, oats, brown rice, and foods made with them. If you want pasta, go with whole wheat pasta.  Protein power - one-quarter of your plate: Fish, chicken, beans, and nuts are all healthy, versatile protein sources. Limit red meat.  The diet, however, does go beyond the plate, offering a few other suggestions.  Use healthy plant oils, such as olive, canola, soy, corn, sunflower and peanut. Check the labels, and avoid partially hydrogenated oil, which have unhealthy trans fats.  If you're thirsty, drink water. Coffee and tea are good in moderation, but skip sugary drinks and limit milk and dairy products to one or two daily servings.  The type of carbohydrate in the diet is more important than the amount. Some sources of carbohydrates, such as vegetables, fruits, whole grains, and beans-are healthier than others.  Finally, stay active.

## 2019-04-13 ENCOUNTER — Other Ambulatory Visit: Payer: Self-pay | Admitting: Adult Health

## 2019-04-13 LAB — CBC WITH DIFFERENTIAL/PLATELET
Absolute Monocytes: 608 cells/uL (ref 200–950)
Basophils Absolute: 61 cells/uL (ref 0–200)
Basophils Relative: 0.8 %
Eosinophils Absolute: 99 cells/uL (ref 15–500)
Eosinophils Relative: 1.3 %
HCT: 43.2 % (ref 35.0–45.0)
Hemoglobin: 13.9 g/dL (ref 11.7–15.5)
Lymphs Abs: 2044 cells/uL (ref 850–3900)
MCH: 28.5 pg (ref 27.0–33.0)
MCHC: 32.2 g/dL (ref 32.0–36.0)
MCV: 88.5 fL (ref 80.0–100.0)
MPV: 10.2 fL (ref 7.5–12.5)
Monocytes Relative: 8 %
Neutro Abs: 4788 cells/uL (ref 1500–7800)
Neutrophils Relative %: 63 %
Platelets: 395 10*3/uL (ref 140–400)
RBC: 4.88 10*6/uL (ref 3.80–5.10)
RDW: 12.3 % (ref 11.0–15.0)
Total Lymphocyte: 26.9 %
WBC: 7.6 10*3/uL (ref 3.8–10.8)

## 2019-04-13 LAB — URINALYSIS, ROUTINE W REFLEX MICROSCOPIC
Bacteria, UA: NONE SEEN /HPF
Bilirubin Urine: NEGATIVE
Glucose, UA: NEGATIVE
Hgb urine dipstick: NEGATIVE
Hyaline Cast: NONE SEEN /LPF
Ketones, ur: NEGATIVE
Nitrite: NEGATIVE
Protein, ur: NEGATIVE
RBC / HPF: NONE SEEN /HPF (ref 0–2)
Specific Gravity, Urine: 1.02 (ref 1.001–1.03)
Squamous Epithelial / HPF: NONE SEEN /HPF (ref <=5)
pH: <=5 (ref 5.0–8.0)

## 2019-04-13 LAB — MAGNESIUM: Magnesium: 2 mg/dL (ref 1.5–2.5)

## 2019-04-13 LAB — COMPLETE METABOLIC PANEL WITH GFR
AG Ratio: 2.1 (calc) (ref 1.0–2.5)
ALT: 58 U/L — ABNORMAL HIGH (ref 6–29)
AST: 79 U/L — ABNORMAL HIGH (ref 10–35)
Albumin: 5 g/dL (ref 3.6–5.1)
Alkaline phosphatase (APISO): 77 U/L (ref 37–153)
BUN/Creatinine Ratio: 21 (calc) (ref 6–22)
BUN: 23 mg/dL (ref 7–25)
CO2: 24 mmol/L (ref 20–32)
Calcium: 10.5 mg/dL — ABNORMAL HIGH (ref 8.6–10.4)
Chloride: 100 mmol/L (ref 98–110)
Creat: 1.08 mg/dL — ABNORMAL HIGH (ref 0.50–0.99)
GFR, Est African American: 61 mL/min/{1.73_m2} (ref >=60)
GFR, Est Non African American: 52 mL/min/{1.73_m2} — ABNORMAL LOW (ref >=60)
Globulin: 2.4 g/dL (calc) (ref 1.9–3.7)
Glucose, Bld: 116 mg/dL — ABNORMAL HIGH (ref 65–99)
Potassium: 4.7 mmol/L (ref 3.5–5.3)
Sodium: 139 mmol/L (ref 135–146)
Total Bilirubin: 0.5 mg/dL (ref 0.2–1.2)
Total Protein: 7.4 g/dL (ref 6.1–8.1)

## 2019-04-13 LAB — HEMOGLOBIN A1C
Hgb A1c MFr Bld: 6.3 % of total Hgb — ABNORMAL HIGH (ref <5.7)
Mean Plasma Glucose: 134 (calc)
eAG (mmol/L): 7.4 (calc)

## 2019-04-13 LAB — MICROALBUMIN / CREATININE URINE RATIO
Creatinine, Urine: 148 mg/dL (ref 20–275)
Microalb Creat Ratio: 8 mcg/mg creat (ref <30)
Microalb, Ur: 1.2 mg/dL

## 2019-04-13 LAB — LIPID PANEL
Cholesterol: 216 mg/dL — ABNORMAL HIGH (ref <200)
HDL: 53 mg/dL (ref >=50)
LDL Cholesterol (Calc): 118 mg/dL (calc) — ABNORMAL HIGH
Non-HDL Cholesterol (Calc): 163 mg/dL (calc) — ABNORMAL HIGH (ref <130)
Total CHOL/HDL Ratio: 4.1 (calc) (ref <5.0)
Triglycerides: 317 mg/dL — ABNORMAL HIGH (ref <150)

## 2019-04-13 LAB — TSH: TSH: 3.96 mIU/L (ref 0.40–4.50)

## 2019-04-13 LAB — VITAMIN D 25 HYDROXY (VIT D DEFICIENCY, FRACTURES): Vit D, 25-Hydroxy: 79 ng/mL (ref 30–100)

## 2019-04-13 MED ORDER — EZETIMIBE 10 MG PO TABS: 10.0000 mg | ORAL_TABLET | Freq: Every day | ORAL | 1 refills | Status: DC

## 2019-04-14 DIAGNOSIS — E119 Type 2 diabetes mellitus without complications: Secondary | ICD-10-CM | POA: Diagnosis not present

## 2019-04-14 DIAGNOSIS — S82852D Displaced trimalleolar fracture of left lower leg, subsequent encounter for closed fracture with routine healing: Secondary | ICD-10-CM | POA: Diagnosis not present

## 2019-04-14 DIAGNOSIS — Z9989 Dependence on other enabling machines and devices: Secondary | ICD-10-CM | POA: Diagnosis not present

## 2019-04-14 DIAGNOSIS — I10 Essential (primary) hypertension: Secondary | ICD-10-CM | POA: Diagnosis not present

## 2019-04-14 DIAGNOSIS — Z7409 Other reduced mobility: Secondary | ICD-10-CM | POA: Diagnosis not present

## 2019-04-14 DIAGNOSIS — G4733 Obstructive sleep apnea (adult) (pediatric): Secondary | ICD-10-CM | POA: Diagnosis not present

## 2019-04-14 DIAGNOSIS — W19XXXD Unspecified fall, subsequent encounter: Secondary | ICD-10-CM | POA: Diagnosis not present

## 2019-04-14 DIAGNOSIS — Z7901 Long term (current) use of anticoagulants: Secondary | ICD-10-CM | POA: Diagnosis not present

## 2019-04-18 DIAGNOSIS — I10 Essential (primary) hypertension: Secondary | ICD-10-CM | POA: Diagnosis not present

## 2019-04-18 DIAGNOSIS — W19XXXD Unspecified fall, subsequent encounter: Secondary | ICD-10-CM | POA: Diagnosis not present

## 2019-04-18 DIAGNOSIS — G4733 Obstructive sleep apnea (adult) (pediatric): Secondary | ICD-10-CM | POA: Diagnosis not present

## 2019-04-18 DIAGNOSIS — S82852D Displaced trimalleolar fracture of left lower leg, subsequent encounter for closed fracture with routine healing: Secondary | ICD-10-CM | POA: Diagnosis not present

## 2019-04-18 DIAGNOSIS — Z7901 Long term (current) use of anticoagulants: Secondary | ICD-10-CM | POA: Diagnosis not present

## 2019-04-18 DIAGNOSIS — Z7409 Other reduced mobility: Secondary | ICD-10-CM | POA: Diagnosis not present

## 2019-04-18 DIAGNOSIS — E119 Type 2 diabetes mellitus without complications: Secondary | ICD-10-CM | POA: Diagnosis not present

## 2019-04-18 DIAGNOSIS — Z9989 Dependence on other enabling machines and devices: Secondary | ICD-10-CM | POA: Diagnosis not present

## 2019-04-20 DIAGNOSIS — W19XXXD Unspecified fall, subsequent encounter: Secondary | ICD-10-CM | POA: Diagnosis not present

## 2019-04-20 DIAGNOSIS — I10 Essential (primary) hypertension: Secondary | ICD-10-CM | POA: Diagnosis not present

## 2019-04-20 DIAGNOSIS — Z7409 Other reduced mobility: Secondary | ICD-10-CM | POA: Diagnosis not present

## 2019-04-20 DIAGNOSIS — Z7901 Long term (current) use of anticoagulants: Secondary | ICD-10-CM | POA: Diagnosis not present

## 2019-04-20 DIAGNOSIS — G4733 Obstructive sleep apnea (adult) (pediatric): Secondary | ICD-10-CM | POA: Diagnosis not present

## 2019-04-20 DIAGNOSIS — S82852D Displaced trimalleolar fracture of left lower leg, subsequent encounter for closed fracture with routine healing: Secondary | ICD-10-CM | POA: Diagnosis not present

## 2019-04-20 DIAGNOSIS — E119 Type 2 diabetes mellitus without complications: Secondary | ICD-10-CM | POA: Diagnosis not present

## 2019-04-20 DIAGNOSIS — Z9989 Dependence on other enabling machines and devices: Secondary | ICD-10-CM | POA: Diagnosis not present

## 2019-04-28 DIAGNOSIS — G4733 Obstructive sleep apnea (adult) (pediatric): Secondary | ICD-10-CM | POA: Diagnosis not present

## 2019-04-28 DIAGNOSIS — M7622 Iliac crest spur, left hip: Secondary | ICD-10-CM | POA: Diagnosis not present

## 2019-04-28 DIAGNOSIS — Z7409 Other reduced mobility: Secondary | ICD-10-CM | POA: Diagnosis not present

## 2019-04-28 DIAGNOSIS — I10 Essential (primary) hypertension: Secondary | ICD-10-CM | POA: Diagnosis not present

## 2019-04-28 DIAGNOSIS — S82852D Displaced trimalleolar fracture of left lower leg, subsequent encounter for closed fracture with routine healing: Secondary | ICD-10-CM | POA: Diagnosis not present

## 2019-04-28 DIAGNOSIS — E119 Type 2 diabetes mellitus without complications: Secondary | ICD-10-CM | POA: Diagnosis not present

## 2019-04-28 DIAGNOSIS — W19XXXD Unspecified fall, subsequent encounter: Secondary | ICD-10-CM | POA: Diagnosis not present

## 2019-04-28 DIAGNOSIS — M25552 Pain in left hip: Secondary | ICD-10-CM | POA: Diagnosis not present

## 2019-04-28 DIAGNOSIS — M160 Bilateral primary osteoarthritis of hip: Secondary | ICD-10-CM | POA: Diagnosis not present

## 2019-04-28 DIAGNOSIS — Z7901 Long term (current) use of anticoagulants: Secondary | ICD-10-CM | POA: Diagnosis not present

## 2019-04-28 DIAGNOSIS — S82892D Other fracture of left lower leg, subsequent encounter for closed fracture with routine healing: Secondary | ICD-10-CM | POA: Diagnosis not present

## 2019-04-28 DIAGNOSIS — Z9989 Dependence on other enabling machines and devices: Secondary | ICD-10-CM | POA: Diagnosis not present

## 2019-04-29 DIAGNOSIS — I10 Essential (primary) hypertension: Secondary | ICD-10-CM | POA: Diagnosis not present

## 2019-04-29 DIAGNOSIS — Z7901 Long term (current) use of anticoagulants: Secondary | ICD-10-CM | POA: Diagnosis not present

## 2019-04-29 DIAGNOSIS — G4733 Obstructive sleep apnea (adult) (pediatric): Secondary | ICD-10-CM | POA: Diagnosis not present

## 2019-04-29 DIAGNOSIS — W19XXXD Unspecified fall, subsequent encounter: Secondary | ICD-10-CM | POA: Diagnosis not present

## 2019-04-29 DIAGNOSIS — S82852D Displaced trimalleolar fracture of left lower leg, subsequent encounter for closed fracture with routine healing: Secondary | ICD-10-CM | POA: Diagnosis not present

## 2019-04-29 DIAGNOSIS — E119 Type 2 diabetes mellitus without complications: Secondary | ICD-10-CM | POA: Diagnosis not present

## 2019-04-29 DIAGNOSIS — Z9989 Dependence on other enabling machines and devices: Secondary | ICD-10-CM | POA: Diagnosis not present

## 2019-04-29 DIAGNOSIS — Z7409 Other reduced mobility: Secondary | ICD-10-CM | POA: Diagnosis not present

## 2019-05-06 ENCOUNTER — Emergency Department (HOSPITAL_COMMUNITY)
Admission: EM | Admit: 2019-05-06 | Discharge: 2019-05-06 | Disposition: A | Discharge status: Home / Self Care | Payer: Medicare HMO | Attending: Emergency Medicine | Admitting: Emergency Medicine

## 2019-05-06 ENCOUNTER — Other Ambulatory Visit: Payer: Self-pay

## 2019-05-06 ENCOUNTER — Emergency Department (HOSPITAL_COMMUNITY): Payer: Medicare HMO

## 2019-05-06 DIAGNOSIS — Y929 Unspecified place or not applicable: Secondary | ICD-10-CM | POA: Diagnosis not present

## 2019-05-06 DIAGNOSIS — R0902 Hypoxemia: Secondary | ICD-10-CM | POA: Diagnosis not present

## 2019-05-06 DIAGNOSIS — Z7401 Bed confinement status: Secondary | ICD-10-CM | POA: Diagnosis not present

## 2019-05-06 DIAGNOSIS — X58XXXA Exposure to other specified factors, initial encounter: Secondary | ICD-10-CM | POA: Insufficient documentation

## 2019-05-06 DIAGNOSIS — N183 Chronic kidney disease, stage 3 (moderate): Secondary | ICD-10-CM | POA: Diagnosis not present

## 2019-05-06 DIAGNOSIS — M7602 Gluteal tendinitis, left hip: Secondary | ICD-10-CM | POA: Insufficient documentation

## 2019-05-06 DIAGNOSIS — S76092A Other specified injury of muscle, fascia and tendon of left hip, initial encounter: Secondary | ICD-10-CM | POA: Insufficient documentation

## 2019-05-06 DIAGNOSIS — S79912A Unspecified injury of left hip, initial encounter: Secondary | ICD-10-CM | POA: Diagnosis present

## 2019-05-06 DIAGNOSIS — M2550 Pain in unspecified joint: Secondary | ICD-10-CM | POA: Diagnosis not present

## 2019-05-06 DIAGNOSIS — Y999 Unspecified external cause status: Secondary | ICD-10-CM | POA: Insufficient documentation

## 2019-05-06 DIAGNOSIS — E162 Hypoglycemia, unspecified: Secondary | ICD-10-CM | POA: Diagnosis not present

## 2019-05-06 DIAGNOSIS — M25552 Pain in left hip: Secondary | ICD-10-CM

## 2019-05-06 DIAGNOSIS — R9431 Abnormal electrocardiogram [ECG] [EKG]: Secondary | ICD-10-CM | POA: Diagnosis not present

## 2019-05-06 DIAGNOSIS — E161 Other hypoglycemia: Secondary | ICD-10-CM | POA: Diagnosis not present

## 2019-05-06 DIAGNOSIS — Y939 Activity, unspecified: Secondary | ICD-10-CM | POA: Insufficient documentation

## 2019-05-06 DIAGNOSIS — M779 Enthesopathy, unspecified: Secondary | ICD-10-CM

## 2019-05-06 DIAGNOSIS — M67852 Other specified disorders of synovium, left hip: Secondary | ICD-10-CM | POA: Diagnosis not present

## 2019-05-06 DIAGNOSIS — R52 Pain, unspecified: Secondary | ICD-10-CM | POA: Diagnosis not present

## 2019-05-06 DIAGNOSIS — Z79899 Other long term (current) drug therapy: Secondary | ICD-10-CM | POA: Diagnosis not present

## 2019-05-06 DIAGNOSIS — M7612 Psoas tendinitis, left hip: Secondary | ICD-10-CM | POA: Diagnosis not present

## 2019-05-06 DIAGNOSIS — R5381 Other malaise: Secondary | ICD-10-CM | POA: Diagnosis not present

## 2019-05-06 MED ORDER — PREDNISONE 20 MG PO TABS: 20.0000 mg | ORAL_TABLET | Freq: Two times a day (BID) | ORAL | 0 refills | Status: DC

## 2019-05-06 MED ORDER — PREDNISONE 20 MG PO TABS
60.0000 mg | ORAL_TABLET | Freq: Once | ORAL | Status: AC
  Administered 2019-05-06: 60 mg via ORAL
  Filled 2019-05-06: qty 3

## 2019-05-06 NOTE — ED Triage Notes (Signed)
Pt BIBA from home. Pt had surgery on left ankle 6 weeks ago for a fx. Pt had PT 1-2 days ago. PT worked her hard, aggrevated her left hip pain. Pt having pain with ambulation.  66mcg of fentanyl en route.

## 2019-05-06 NOTE — ED Provider Notes (Signed)
[name] DEPT Provider Note   CSN: [id number] Arrival date & time: 05/06/19  1427     History   Chief Complaint Chief Complaint  Patient presents with  . Hip Pain    HPI Kimberly Ray is a 68 y.o. female.     HPI   She presents for evaluation of excruciating left hip pain which is aggravated by any movement, sitting in a chair or with attempted to ambulate using her walker.  She is doing touchdown left foot only.  She is recovering from a left ankle ORIF, March 14, 2019.  She is wearing a cam walker on the left lower leg.  She has been doing PT at home with a therapist, and some home exercises until several days ago when she stopped because of the discomfort.  There has been no specific injuries or fall.  She saw her orthopedist last week who x-rayed her hip and did not find a complication, while she was having this same pain.  He advised symptomatic treatment.  She is using Tylenol, and today took 2 Percocets, with transient relief of the pain.  She denies fever, chills, vomiting, dysuria, urinary frequency, changes in bowel habits.  She denies back pain.  There are no other known modifying factors.  Past Medical History:  Diagnosis Date  . Anemia   . Depression   . GERD (gastroesophageal reflux disease)   . Hay fever   . Head trauma   . IBS (irritable bowel syndrome)   . Memory loss   . Migraines   . OSA on CPAP   . Reflux   . RLS (restless legs syndrome)   . Sinus congestion     Patient Active Problem List   Diagnosis Date Noted  . CKD (chronic kidney disease) stage 3, GFR 30-59 ml/min (HCC) 04/11/2019  . Osteopenia 04/06/2018  . Fatty liver 08/29/2017  . Migraines 03/25/2017  . Circadian rhythm sleep disorder 03/25/2017  . Periodic limb movement disorder (PLMD) 03/25/2017  . Overweight (BMI 25.0-29.9) 06/16/2015  . Medication management 06/15/2015  . Other abnormal glucose (prediabetes) 11/09/2014  . Vitamin D deficiency  11/09/2014  . GERD  11/09/2014  . Extrinsic asthma 11/09/2014  . Bipolar depression ([place]) 06/07/2014  . OSA on CPAP   . Mixed hyperlipidemia 06/29/2013  . ADD (attention deficit disorder) 06/29/2013    Past Surgical History:  Procedure Laterality Date  . BREAST BIOPSY Right 02/27/2014   Stereo- Benign  . CHOLECYSTECTOMY  11/03/2017   Dr. Rosendo Gros  . LAPAROSCOPIC HYSTERECTOMY  12/2017  . NASAL SINUS SURGERY    . ORIF ANKLE FRACTURE BIMALLEOLAR Left 03/14/2019   [facility], Dr. Sheran Lawless  . perforated eardrum     left side  . TONSILLECTOMY AND ADENOIDECTOMY    . URETEROSCOPY     for ureteral stone     OB History   No obstetric history on file.      Home Medications    Prior to Admission medications   Medication Sig Start Date End Date Taking? Authorizing Provider  acetaminophen (TYLENOL) 650 MG CR tablet Take 650 mg by mouth every 8 (eight) hours as needed for pain.   Yes [provider]  buPROPion (WELLBUTRIN XL) 300 MG 24 hr tablet Take 1 tablet (300 mg total) by mouth daily. 04/12/19  Yes Liane Comber, NP  DULoxetine (CYMBALTA) 60 MG capsule TAKE ONE CAPSULE BY MOUTH TWICE A DAY Patient taking differently: Take 60 mg by mouth daily. Takes once a day. **  Do NOT chew, crush, or open capsule ** 08/25/18  Yes Vicie Mutters, PA-C  ezetimibe (ZETIA) 10 MG tablet Take 1 tablet (10 mg total) by mouth daily. 04/13/19  Yes Liane Comber, NP  famotidine (PEPCID) 20 MG tablet Take 1 tablet (20 mg total) by mouth 2 (two) times daily as needed for heartburn or indigestion. 04/12/19 04/11/20 Yes Liane Comber, NP  fenofibrate (TRICOR) 145 MG tablet Take 1 tablet Daily for Blood Fats (Triglycerides) Patient taking differently: Take 145 mg by mouth daily.  02/16/19  Yes Unk Pinto, MD  lamoTRIgine (LAMICTAL) 100 MG tablet Take 1 tablet (100 mg total) by mouth daily. 12/02/18  Yes Liane Comber, NP  ondansetron (ZOFRAN) 4 MG tablet Take 1 to 2 tablets 2 to 3 x / day if needed  for Nausea Patient taking differently: Take 4-8 mg by mouth 3 (three) times daily as needed for nausea.  01/19/18  Yes Unk Pinto, MD  rOPINIRole (REQUIP) 1 MG tablet Take 1/2 to 1 or 2 tablets at night for Restless Legs Patient taking differently: Take 0.5-2 mg by mouth at bedtime as needed (restless legs).  11/04/18  Yes Unk Pinto, MD  SUMAtriptan (IMITREX) 25 MG tablet Take 1-2 tab with onset of migraine. May repeat in 2 hours if headache persists. Max 4 tabs in 24 hours. Patient taking differently: Take 25-50 mg by mouth See admin instructions. Take 1-2 tab with onset of migraine. May repeat in 2 hours if headache persists. Max 4 tabs in 24 hours. 12/02/18  Yes Liane Comber, NP  amphetamine-dextroamphetamine (ADDERALL) 10 MG tablet Take 1   To 1 &1/2  Tablets 1 or 2 x /day for Focus & Concentration Patient not taking: Reported on 04/12/2019 02/16/19   Unk Pinto, MD  azelastine (ASTELIN) 0.1 % nasal spray Place 2 sprays into both nostrils 2 (two) times daily. Use in each nostril as directed Patient not taking: Reported on 08/26/2018 08/16/18   Liane Comber, NP  predniSONE (DELTASONE) 20 MG tablet Take 1 tablet (20 mg total) by mouth 2 (two) times daily. 05/06/19   Daleen Bo, MD    Family History Family History  Problem Relation Age of Onset  . Diabetes Mother   . Heart failure Father   . Diabetes Sister   . Breast cancer Maternal Grandmother 40  . Breast cancer Maternal Aunt 74  . Breast cancer Cousin   . Osteoporosis Brother   . Heart failure Maternal Grandfather 80  . Colon cancer Paternal Grandmother 77  . Stomach cancer Maternal Aunt     Social History Social History   Tobacco Use  . Smoking status: Never Smoker  . Smokeless tobacco: Never Used  Substance Use Topics  . Alcohol use: Yes    Alcohol/week: 1.0 standard drinks    Types: 1 Shots of liquor per week    Comment: Once a year  . Drug use: No     Allergies   Erythromycin, Iodinated  diagnostic agents, Penicillins, Clarithromycin, and Brintellix [vortioxetine]   Review of Systems Review of Systems  All other systems reviewed and are negative.    Physical Exam Updated Vital Signs BP 115/78   Pulse 81   Temp 98.6 F (37 C)   Resp 18   Wt 87 kg   SpO2 99%   BMI 30.04 kg/m   Physical Exam Vitals signs and nursing note reviewed.  Constitutional:      Appearance: She is well-developed.  HENT:     Head: Normocephalic and atraumatic.  Right Ear: External ear normal.     Left Ear: External ear normal.  Eyes:     Conjunctiva/sclera: Conjunctivae normal.     Pupils: Pupils are equal, round, and reactive to light.  Neck:     Musculoskeletal: Normal range of motion and neck supple.     Trachea: Phonation normal.  Cardiovascular:     Rate and Rhythm: Normal rate.  Pulmonary:     Effort: Pulmonary effort is normal.  Musculoskeletal:     Comments: Patient guards against hip flexion left secondary to severe pain.  I am able to move the left hip, internally and externally, without pain.  She is wearing an orthopedic appliance on her left lower leg.  She has normal range of motion of the right leg.  Skin:    General: Skin is warm and dry.  Neurological:     Mental Status: She is alert and oriented to person, place, and time.     Cranial Nerves: No cranial nerve deficit.     Sensory: No sensory deficit.     Motor: No abnormal muscle tone.     Coordination: Coordination normal.  Psychiatric:        Mood and Affect: Mood normal.        Behavior: Behavior normal.        Thought Content: Thought content normal.        Judgment: Judgment normal.      ED Treatments / Results  Labs (all labs ordered are listed, but only abnormal results are displayed) Labs Reviewed - No data to display  EKG None  Radiology Mr Hip Left Wo Contrast  Result Date: 05/06/2019 CLINICAL DATA:  Left ankle surgery 6 weeks ago. Head PT 1-2 days ago. Left hip pain. EXAM: MR OF  THE LEFT HIP WITHOUT CONTRAST TECHNIQUE: Multiplanar, multisequence MR imaging was performed. No intravenous contrast was administered. COMPARISON:  None. FINDINGS: Bones: No hip fracture, dislocation or avascular necrosis. No periosteal reaction or bone destruction. No aggressive osseous lesion. Normal sacrum and sacroiliac joints. No SI joint widening or erosive changes. Sacral Tarlov cyst. Degenerative disease with disc height loss at L5-S1 and to lesser extent L3-4 and L4-5. Articular cartilage and labrum Articular cartilage:  No chondral defect. Labrum: Grossly intact, but evaluation is limited by lack of intraarticular fluid. Joint or bursal effusion Joint effusion:  No hip joint effusion.  No SI joint effusion. Bursae:  No bursa formation. Muscles and tendons Flexors: Moderate tendinosis with a small partial tear of the distal left iliopsoas tendon. Remainder of the flexor muscles and tendons are intact. Extensors: Normal. Abductors: Normal. Adductors: Normal. Gluteals: Mild tendinosis of the left gluteus minimus tendon. Hamstrings: Normal. Other findings Miscellaneous: No pelvic free fluid. No fluid collection or hematoma. No inguinal lymphadenopathy. No inguinal hernia. IMPRESSION: 1. No acute injury of the left hip. 2. Moderate tendinosis with a small partial tear of the distal left iliopsoas tendon. 3. Mild tendinosis of left gluteus minimus tendon insertion. Electronically Signed   By: Kathreen Devoid   On: 05/06/2019 19:12    Procedures Procedures (including critical care time)  Medications Ordered in ED Medications  predniSONE (DELTASONE) tablet 60 mg (60 mg Oral Given 05/06/19 2112)     Initial Impression / Assessment and Plan / ED Course  I have reviewed the triage vital signs and the nursing notes.  Pertinent labs & imaging results that were available during my care of the patient were reviewed by me and considered in my medical  decision making (see chart for details).  Clinical Course  as of May 05 2226  Fri May 06, 2019  1737 Advanced imaging left hip ordered to evaluate for occult fracture, or other pelvic/hip complication.   [EW]  2044 MRI has been performed, completed and read, the reading is not visible in epic.  I was able to reach the technician at the radiology department who read me the report that stated that she had no acute injury of the left hip but did have some tendinopathy and several areas around the hip, that is likely the source of her pain.   [EW]    Clinical Course User Index [EW] Daleen Bo, MD        Patient Vitals for the past 24 hrs:  BP Temp Pulse Resp SpO2 Weight  05/06/19 2119 115/78 - 81 18 99 % -  05/06/19 2027 110/72 - 90 18 100 % -  05/06/19 1741 122/67 - 78 17 97 % -  05/06/19 1444 131/73 - - - - -  05/06/19 1442 - 98.6 F (37 C) 84 16 99 % -  05/06/19 1439 - - - - - 87 kg    09:27 PM Reevaluation with update and discussion. After initial assessment and treatment, an updated evaluation reveals she remains comfortable at rest.  Findings discussed with the patient and all questions answered. Daleen Bo   Medical Decision Making: Left hip pain related to tendinopathy around her left hip.  No evidence for fracture on advanced imaging.  No evident complication from ankle surgical repair.  CRITICAL CARE-no Performed by: Daleen Bo  Nursing Notes Reviewed/ Care Coordinated Applicable Imaging Reviewed Interpretation of Laboratory Data incorporated into ED treatment  The patient appears reasonably screened and/or stabilized for discharge and I doubt any other medical condition or other [facility] requiring further screening, evaluation, or treatment in the ED at this time prior to discharge.  Plan: Home Medications-continue usual; Home Treatments-heat to affected area; return here if the recommended treatment, does not improve the symptoms; Recommended follow up-PCP, PRN   Final Clinical Impressions(s) / ED Diagnoses   Final  diagnoses:  Left hip pain  Tendinitis    ED Discharge Orders         Ordered    predniSONE (DELTASONE) 20 MG tablet  2 times daily     05/06/19 2050           Daleen Bo, MD 05/06/19 2229

## 2019-05-06 NOTE — Discharge Instructions (Signed)
The pain in your left hip appears to be from tendinitis in the supporting muscles around the hip.  Patient improved with time, heat and anti-inflammatory medication.  We are starting prednisone, and have sent a prescription to your pharmacy.  Make sure you call your orthopedic doctor on Monday morning to inform him of the findings and see if he has additional care recommendations.  He will likely need to alter your physical therapy regimen.

## 2019-05-06 NOTE — ED Notes (Signed)
[facility] called for transportation  

## 2019-05-19 DIAGNOSIS — R69 Illness, unspecified: Secondary | ICD-10-CM | POA: Diagnosis not present

## 2019-05-26 DIAGNOSIS — G4733 Obstructive sleep apnea (adult) (pediatric): Secondary | ICD-10-CM | POA: Diagnosis not present

## 2019-05-26 DIAGNOSIS — E119 Type 2 diabetes mellitus without complications: Secondary | ICD-10-CM | POA: Diagnosis not present

## 2019-05-26 DIAGNOSIS — Z7982 Long term (current) use of aspirin: Secondary | ICD-10-CM | POA: Diagnosis not present

## 2019-05-26 DIAGNOSIS — Z7409 Other reduced mobility: Secondary | ICD-10-CM | POA: Diagnosis not present

## 2019-05-26 DIAGNOSIS — W19XXXD Unspecified fall, subsequent encounter: Secondary | ICD-10-CM | POA: Diagnosis not present

## 2019-05-26 DIAGNOSIS — I10 Essential (primary) hypertension: Secondary | ICD-10-CM | POA: Diagnosis not present

## 2019-05-26 DIAGNOSIS — S82852D Displaced trimalleolar fracture of left lower leg, subsequent encounter for closed fracture with routine healing: Secondary | ICD-10-CM | POA: Diagnosis not present

## 2019-05-27 DIAGNOSIS — W19XXXD Unspecified fall, subsequent encounter: Secondary | ICD-10-CM | POA: Diagnosis not present

## 2019-05-27 DIAGNOSIS — Z7982 Long term (current) use of aspirin: Secondary | ICD-10-CM | POA: Diagnosis not present

## 2019-05-27 DIAGNOSIS — I10 Essential (primary) hypertension: Secondary | ICD-10-CM | POA: Diagnosis not present

## 2019-05-27 DIAGNOSIS — S82852D Displaced trimalleolar fracture of left lower leg, subsequent encounter for closed fracture with routine healing: Secondary | ICD-10-CM | POA: Diagnosis not present

## 2019-05-27 DIAGNOSIS — E119 Type 2 diabetes mellitus without complications: Secondary | ICD-10-CM | POA: Diagnosis not present

## 2019-05-27 DIAGNOSIS — G4733 Obstructive sleep apnea (adult) (pediatric): Secondary | ICD-10-CM | POA: Diagnosis not present

## 2019-05-27 DIAGNOSIS — Z7409 Other reduced mobility: Secondary | ICD-10-CM | POA: Diagnosis not present

## 2019-05-31 DIAGNOSIS — Z7982 Long term (current) use of aspirin: Secondary | ICD-10-CM | POA: Diagnosis not present

## 2019-05-31 DIAGNOSIS — W19XXXD Unspecified fall, subsequent encounter: Secondary | ICD-10-CM | POA: Diagnosis not present

## 2019-05-31 DIAGNOSIS — S82852D Displaced trimalleolar fracture of left lower leg, subsequent encounter for closed fracture with routine healing: Secondary | ICD-10-CM | POA: Diagnosis not present

## 2019-05-31 DIAGNOSIS — E119 Type 2 diabetes mellitus without complications: Secondary | ICD-10-CM | POA: Diagnosis not present

## 2019-05-31 DIAGNOSIS — Z7409 Other reduced mobility: Secondary | ICD-10-CM | POA: Diagnosis not present

## 2019-05-31 DIAGNOSIS — I10 Essential (primary) hypertension: Secondary | ICD-10-CM | POA: Diagnosis not present

## 2019-05-31 DIAGNOSIS — G4733 Obstructive sleep apnea (adult) (pediatric): Secondary | ICD-10-CM | POA: Diagnosis not present

## 2019-06-01 DIAGNOSIS — Z7409 Other reduced mobility: Secondary | ICD-10-CM | POA: Diagnosis not present

## 2019-06-01 DIAGNOSIS — E119 Type 2 diabetes mellitus without complications: Secondary | ICD-10-CM | POA: Diagnosis not present

## 2019-06-01 DIAGNOSIS — W19XXXD Unspecified fall, subsequent encounter: Secondary | ICD-10-CM | POA: Diagnosis not present

## 2019-06-01 DIAGNOSIS — Z7982 Long term (current) use of aspirin: Secondary | ICD-10-CM | POA: Diagnosis not present

## 2019-06-01 DIAGNOSIS — I10 Essential (primary) hypertension: Secondary | ICD-10-CM | POA: Diagnosis not present

## 2019-06-01 DIAGNOSIS — S82852D Displaced trimalleolar fracture of left lower leg, subsequent encounter for closed fracture with routine healing: Secondary | ICD-10-CM | POA: Diagnosis not present

## 2019-06-01 DIAGNOSIS — G4733 Obstructive sleep apnea (adult) (pediatric): Secondary | ICD-10-CM | POA: Diagnosis not present

## 2019-06-02 DIAGNOSIS — W19XXXD Unspecified fall, subsequent encounter: Secondary | ICD-10-CM | POA: Diagnosis not present

## 2019-06-02 DIAGNOSIS — S82852D Displaced trimalleolar fracture of left lower leg, subsequent encounter for closed fracture with routine healing: Secondary | ICD-10-CM | POA: Diagnosis not present

## 2019-06-02 DIAGNOSIS — G4733 Obstructive sleep apnea (adult) (pediatric): Secondary | ICD-10-CM | POA: Diagnosis not present

## 2019-06-02 DIAGNOSIS — Z7982 Long term (current) use of aspirin: Secondary | ICD-10-CM | POA: Diagnosis not present

## 2019-06-02 DIAGNOSIS — I10 Essential (primary) hypertension: Secondary | ICD-10-CM | POA: Diagnosis not present

## 2019-06-02 DIAGNOSIS — E119 Type 2 diabetes mellitus without complications: Secondary | ICD-10-CM | POA: Diagnosis not present

## 2019-06-02 DIAGNOSIS — Z7409 Other reduced mobility: Secondary | ICD-10-CM | POA: Diagnosis not present

## 2019-06-03 DIAGNOSIS — G4733 Obstructive sleep apnea (adult) (pediatric): Secondary | ICD-10-CM | POA: Diagnosis not present

## 2019-06-03 DIAGNOSIS — E119 Type 2 diabetes mellitus without complications: Secondary | ICD-10-CM | POA: Diagnosis not present

## 2019-06-03 DIAGNOSIS — I10 Essential (primary) hypertension: Secondary | ICD-10-CM | POA: Diagnosis not present

## 2019-06-03 DIAGNOSIS — W19XXXD Unspecified fall, subsequent encounter: Secondary | ICD-10-CM | POA: Diagnosis not present

## 2019-06-03 DIAGNOSIS — Z7982 Long term (current) use of aspirin: Secondary | ICD-10-CM | POA: Diagnosis not present

## 2019-06-03 DIAGNOSIS — Z7409 Other reduced mobility: Secondary | ICD-10-CM | POA: Diagnosis not present

## 2019-06-03 DIAGNOSIS — S82852D Displaced trimalleolar fracture of left lower leg, subsequent encounter for closed fracture with routine healing: Secondary | ICD-10-CM | POA: Diagnosis not present

## 2019-06-06 DIAGNOSIS — I10 Essential (primary) hypertension: Secondary | ICD-10-CM | POA: Diagnosis not present

## 2019-06-06 DIAGNOSIS — Z7409 Other reduced mobility: Secondary | ICD-10-CM | POA: Diagnosis not present

## 2019-06-06 DIAGNOSIS — E119 Type 2 diabetes mellitus without complications: Secondary | ICD-10-CM | POA: Diagnosis not present

## 2019-06-06 DIAGNOSIS — S82852D Displaced trimalleolar fracture of left lower leg, subsequent encounter for closed fracture with routine healing: Secondary | ICD-10-CM | POA: Diagnosis not present

## 2019-06-06 DIAGNOSIS — G4733 Obstructive sleep apnea (adult) (pediatric): Secondary | ICD-10-CM | POA: Diagnosis not present

## 2019-06-06 DIAGNOSIS — W19XXXD Unspecified fall, subsequent encounter: Secondary | ICD-10-CM | POA: Diagnosis not present

## 2019-06-06 DIAGNOSIS — Z7982 Long term (current) use of aspirin: Secondary | ICD-10-CM | POA: Diagnosis not present

## 2019-06-08 DIAGNOSIS — Z7982 Long term (current) use of aspirin: Secondary | ICD-10-CM | POA: Diagnosis not present

## 2019-06-08 DIAGNOSIS — G4733 Obstructive sleep apnea (adult) (pediatric): Secondary | ICD-10-CM | POA: Diagnosis not present

## 2019-06-08 DIAGNOSIS — W19XXXD Unspecified fall, subsequent encounter: Secondary | ICD-10-CM | POA: Diagnosis not present

## 2019-06-08 DIAGNOSIS — I10 Essential (primary) hypertension: Secondary | ICD-10-CM | POA: Diagnosis not present

## 2019-06-08 DIAGNOSIS — Z7409 Other reduced mobility: Secondary | ICD-10-CM | POA: Diagnosis not present

## 2019-06-08 DIAGNOSIS — E119 Type 2 diabetes mellitus without complications: Secondary | ICD-10-CM | POA: Diagnosis not present

## 2019-06-08 DIAGNOSIS — S82852D Displaced trimalleolar fracture of left lower leg, subsequent encounter for closed fracture with routine healing: Secondary | ICD-10-CM | POA: Diagnosis not present

## 2019-06-09 DIAGNOSIS — S82852D Displaced trimalleolar fracture of left lower leg, subsequent encounter for closed fracture with routine healing: Secondary | ICD-10-CM | POA: Diagnosis not present

## 2019-06-09 DIAGNOSIS — I10 Essential (primary) hypertension: Secondary | ICD-10-CM | POA: Diagnosis not present

## 2019-06-09 DIAGNOSIS — E119 Type 2 diabetes mellitus without complications: Secondary | ICD-10-CM | POA: Diagnosis not present

## 2019-06-09 DIAGNOSIS — W19XXXD Unspecified fall, subsequent encounter: Secondary | ICD-10-CM | POA: Diagnosis not present

## 2019-06-09 DIAGNOSIS — Z7982 Long term (current) use of aspirin: Secondary | ICD-10-CM | POA: Diagnosis not present

## 2019-06-09 DIAGNOSIS — Z7409 Other reduced mobility: Secondary | ICD-10-CM | POA: Diagnosis not present

## 2019-06-09 DIAGNOSIS — G4733 Obstructive sleep apnea (adult) (pediatric): Secondary | ICD-10-CM | POA: Diagnosis not present

## 2019-06-10 DIAGNOSIS — E119 Type 2 diabetes mellitus without complications: Secondary | ICD-10-CM | POA: Diagnosis not present

## 2019-06-10 DIAGNOSIS — W19XXXD Unspecified fall, subsequent encounter: Secondary | ICD-10-CM | POA: Diagnosis not present

## 2019-06-10 DIAGNOSIS — I10 Essential (primary) hypertension: Secondary | ICD-10-CM | POA: Diagnosis not present

## 2019-06-10 DIAGNOSIS — Z7982 Long term (current) use of aspirin: Secondary | ICD-10-CM | POA: Diagnosis not present

## 2019-06-10 DIAGNOSIS — S82852D Displaced trimalleolar fracture of left lower leg, subsequent encounter for closed fracture with routine healing: Secondary | ICD-10-CM | POA: Diagnosis not present

## 2019-06-10 DIAGNOSIS — G4733 Obstructive sleep apnea (adult) (pediatric): Secondary | ICD-10-CM | POA: Diagnosis not present

## 2019-06-10 DIAGNOSIS — Z7409 Other reduced mobility: Secondary | ICD-10-CM | POA: Diagnosis not present

## 2019-06-13 ENCOUNTER — Telehealth: Payer: Self-pay | Admitting: *Deleted

## 2019-06-13 DIAGNOSIS — Z7409 Other reduced mobility: Secondary | ICD-10-CM | POA: Diagnosis not present

## 2019-06-13 DIAGNOSIS — I10 Essential (primary) hypertension: Secondary | ICD-10-CM | POA: Diagnosis not present

## 2019-06-13 DIAGNOSIS — E119 Type 2 diabetes mellitus without complications: Secondary | ICD-10-CM | POA: Diagnosis not present

## 2019-06-13 DIAGNOSIS — G4733 Obstructive sleep apnea (adult) (pediatric): Secondary | ICD-10-CM | POA: Diagnosis not present

## 2019-06-13 DIAGNOSIS — Z7982 Long term (current) use of aspirin: Secondary | ICD-10-CM | POA: Diagnosis not present

## 2019-06-13 DIAGNOSIS — W19XXXD Unspecified fall, subsequent encounter: Secondary | ICD-10-CM | POA: Diagnosis not present

## 2019-06-13 DIAGNOSIS — S82852D Displaced trimalleolar fracture of left lower leg, subsequent encounter for closed fracture with routine healing: Secondary | ICD-10-CM | POA: Diagnosis not present

## 2019-06-13 NOTE — Telephone Encounter (Signed)
Patient called and reported a headache with cough, swollen glands in neck and achy felling, but no elevated temperature. Per Garnet Sierras, NP, the patient was advised she can get the Covid test, if she she is concerned about being with her mother. Patient is aware.

## 2019-06-15 DIAGNOSIS — W19XXXD Unspecified fall, subsequent encounter: Secondary | ICD-10-CM | POA: Diagnosis not present

## 2019-06-15 DIAGNOSIS — I10 Essential (primary) hypertension: Secondary | ICD-10-CM | POA: Diagnosis not present

## 2019-06-15 DIAGNOSIS — G4733 Obstructive sleep apnea (adult) (pediatric): Secondary | ICD-10-CM | POA: Diagnosis not present

## 2019-06-15 DIAGNOSIS — E119 Type 2 diabetes mellitus without complications: Secondary | ICD-10-CM | POA: Diagnosis not present

## 2019-06-15 DIAGNOSIS — S82852D Displaced trimalleolar fracture of left lower leg, subsequent encounter for closed fracture with routine healing: Secondary | ICD-10-CM | POA: Diagnosis not present

## 2019-06-15 DIAGNOSIS — Z7409 Other reduced mobility: Secondary | ICD-10-CM | POA: Diagnosis not present

## 2019-06-15 DIAGNOSIS — Z7982 Long term (current) use of aspirin: Secondary | ICD-10-CM | POA: Diagnosis not present

## 2019-06-19 DIAGNOSIS — Z79899 Other long term (current) drug therapy: Secondary | ICD-10-CM | POA: Diagnosis not present

## 2019-06-19 DIAGNOSIS — Z91041 Radiographic dye allergy status: Secondary | ICD-10-CM | POA: Diagnosis not present

## 2019-06-19 DIAGNOSIS — M25572 Pain in left ankle and joints of left foot: Secondary | ICD-10-CM | POA: Diagnosis not present

## 2019-06-19 DIAGNOSIS — Z7982 Long term (current) use of aspirin: Secondary | ICD-10-CM | POA: Diagnosis not present

## 2019-06-19 DIAGNOSIS — I959 Hypotension, unspecified: Secondary | ICD-10-CM | POA: Diagnosis not present

## 2019-06-19 DIAGNOSIS — M7989 Other specified soft tissue disorders: Secondary | ICD-10-CM | POA: Diagnosis not present

## 2019-06-19 DIAGNOSIS — R52 Pain, unspecified: Secondary | ICD-10-CM | POA: Diagnosis not present

## 2019-06-19 DIAGNOSIS — R29898 Other symptoms and signs involving the musculoskeletal system: Secondary | ICD-10-CM | POA: Diagnosis not present

## 2019-06-19 DIAGNOSIS — S99912A Unspecified injury of left ankle, initial encounter: Secondary | ICD-10-CM | POA: Diagnosis not present

## 2019-06-19 DIAGNOSIS — Z880 Allergy status to penicillin: Secondary | ICD-10-CM | POA: Diagnosis not present

## 2019-06-19 DIAGNOSIS — Z881 Allergy status to other antibiotic agents status: Secondary | ICD-10-CM | POA: Diagnosis not present

## 2019-06-23 DIAGNOSIS — S82892D Other fracture of left lower leg, subsequent encounter for closed fracture with routine healing: Secondary | ICD-10-CM | POA: Diagnosis not present

## 2019-06-23 DIAGNOSIS — M722 Plantar fascial fibromatosis: Secondary | ICD-10-CM | POA: Diagnosis not present

## 2019-06-24 DIAGNOSIS — E119 Type 2 diabetes mellitus without complications: Secondary | ICD-10-CM | POA: Diagnosis not present

## 2019-06-24 DIAGNOSIS — S82852D Displaced trimalleolar fracture of left lower leg, subsequent encounter for closed fracture with routine healing: Secondary | ICD-10-CM | POA: Diagnosis not present

## 2019-06-24 DIAGNOSIS — I10 Essential (primary) hypertension: Secondary | ICD-10-CM | POA: Diagnosis not present

## 2019-06-24 DIAGNOSIS — Z7409 Other reduced mobility: Secondary | ICD-10-CM | POA: Diagnosis not present

## 2019-06-24 DIAGNOSIS — W19XXXD Unspecified fall, subsequent encounter: Secondary | ICD-10-CM | POA: Diagnosis not present

## 2019-06-24 DIAGNOSIS — G4733 Obstructive sleep apnea (adult) (pediatric): Secondary | ICD-10-CM | POA: Diagnosis not present

## 2019-06-24 DIAGNOSIS — Z7982 Long term (current) use of aspirin: Secondary | ICD-10-CM | POA: Diagnosis not present

## 2019-07-12 ENCOUNTER — Other Ambulatory Visit: Payer: Self-pay | Admitting: Physician Assistant

## 2019-07-12 DIAGNOSIS — F319 Bipolar disorder, unspecified: Secondary | ICD-10-CM

## 2019-07-19 NOTE — Progress Notes (Deleted)
MEDICARE ANNUAL WELLNESS VISIT AND FOLLOW UP  Assessment:    Encounter for annual medicare wellness visit Due annually   Hyperlipidemia - newly on zetia, continue fenofibrate; consider switching to low dose rosuvastatin if remains above goal *** -cont diet and exercise - Lipid panel   Hyperglycemia Discussed general issues about diabetes pathophysiology and management., Educational material distributed., Suggested low cholesterol diet., Encouraged aerobic exercise., Discussed foot care., Reminded to get yearly retinal exam.   Essential hypertension - mild, currrently at goal off of medication, DASH diet, exercise and monitor at home. Call if greater than 130/80.  - TSH   Vitamin D deficiency At goal at recent check; continue to recommend supplementation for goal of 60-100 Defer vitamin D level  Osteopenia - get dexa q2y, due 2022, continue Vit D and Ca, weight bearing exercises  ADD (attention deficit disorder) Continue medications:  Helps with focus, no AE's. The patient was counseled on the addictive nature of the medication and was encouraged to take drug holidays when not needed.   CKD 3 Increase fluids, avoid NSAIDS, monitor sugars, will monitor  Fatty liver Weight loss advised, avoid alcohol/tylenol, will monitor LFTs  OSA on CPAP Endorses 100 % compliance  Extrinsic asthma without complication, unspecified asthma severity, unspecified whether persistent Avoid triggers  Gastroesophageal reflux disease, esophagitis presence not specified Famotidine *** Discussed diet, avoiding triggers and other lifestyle changes  RLS (restless legs syndrome) Monitor iron, magnesium, increase walking if not getting better can try going back to neuro  Migraines ***  Recurrent Major depression, chronic ([place]) Continue medications She will schedule with psych due to ongoing poor control of depression following retirement and with covid 19 ***  Overweight Long discussion  about weight loss, diet, and exercise Recommended diet heavy in fruits and veggies and low in animal meats, cheeses, and dairy products, appropriate calorie intake Discussed appropriate weight for height  Follow up at next visit    Over 40 minutes of exam, counseling, chart review and critical decision making was performed Future Appointments  Date Time Provider [place]  07/20/2019 11:15 AM Liane Comber, NP [facility] None  10/27/2019  9:30 AM Unk Pinto, MD [facility] None  04/11/2020 10:00 AM Liane Comber, NP [facility] None     Plan:   During the course of the visit the patient was educated and counseled about appropriate screening and preventive services including:    Pneumococcal vaccine   Prevnar 13  Influenza vaccine  Td vaccine  Screening electrocardiogram  Bone densitometry screening  Colorectal cancer screening  Diabetes screening  Glaucoma screening  Nutrition counseling   Advanced directives: requested   Subjective:  Kimberly Ray is a 68 y.o. female who presents for Medicare Annual Wellness Visit and 3 month follow up.   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   She had left ankle ORIF via [facility] in 02/2019 after tripping down a step going to a pool and in boot and wheelchair; following with Dr. Sheran Lawless. Doing PT at home.  Mom is helping take care of her at home.   Last year she experienced self-limiting uterine bleeding, workup by GYN demonstrated an endometrial stripe of 4 mm, an ultrasound that showed a 10 mm irregular cyst and laparoscopic findings notable for an adnexa with excrescences.  CA125 was 6.  She underwent hysterectomy bilateral salpingo-oophorectomy and a minimally invasive fashion on Dec 23, 2017. ***  She has long hx of severe depression and is on lamictal, wellbutrin, cymbalta and ADD medication  and remains poorly controlled; worse since retirement and with covid 19; after discussion she is  planning to schedule an appointment with psychiatry fur further management. ***  She has RLS symptoms, currently fairly managed by requip 1-2 mg nightly  She has OSA sleep study 3 years ago, now on CPAP, endorses 100% compliance and restorative sleep***  Asthma ***  Migraines ***  Has GERD and newly on famotidine ***  BMI is There is no height or weight on file to calculate BMI., she {HAS HAS [id number] been working on diet and exercise. Wt Readings from Last 3 Encounters:  05/06/19 191 lb 12.8 oz (87 kg)  02/16/19 192 lb (87.1 kg)  08/26/18 187 lb (84.8 kg)   Her BP is monitored expectantly with mild elevations intermittently. Never on medications.   Her blood pressure {HAS HAS NOT:18834} been controlled at home, today their BP is   She {DOES_DOES [id number] workout. She denies chest pain, shortness of breath, dizziness.   She is on cholesterol medication (newly on zetia 10 mg, on fenofibrate 145 mg daily) and denies myalgias. Her cholesterol is not at goal. The cholesterol last visit was:   Lab Results  Component Value Date   CHOL 216 (H) 04/12/2019   HDL 53 04/12/2019   LDLCALC 118 (H) 04/12/2019   TRIG 317 (H) 04/12/2019   CHOLHDL 4.1 04/12/2019   She has had prediabetes predating 2014. She {Has/has not:18111} been working on diet and exercise for prediabetes, and denies {Symptoms; diabetes w/o none:19199}. Last A1C in the office was:  Lab Results  Component Value Date   HGBA1C 6.3 (H) 04/12/2019   She has stable CKD IIIa monitored at this office. Last GFR: Lab Results  Component Value Date   GFRNONAA 5 (L) 04/12/2019   Patient is on Vitamin D supplement and was at goal at last check:    Lab Results  Component Value Date   VD25OH 79 04/12/2019      Medication Review: Current Outpatient Medications on File Prior to Visit  Medication Sig Dispense Refill  . acetaminophen (TYLENOL) 650 MG CR tablet Take 650 mg by mouth every 8 (eight) hours as needed for pain.    Marland Kitchen  amphetamine-dextroamphetamine (ADDERALL) 10 MG tablet Take 1   To 1 &1/2  Tablets 1 or 2 x /day for Focus & Concentration (Patient not taking: Reported on 04/12/2019) 90 tablet 0  . buPROPion (WELLBUTRIN XL) 300 MG 24 hr tablet Take 1 tablet (300 mg total) by mouth daily. 90 tablet 1  . DULoxetine (CYMBALTA) 60 MG capsule Take 1 capsule Daily for Mood 90 capsule 3  . ezetimibe (ZETIA) 10 MG tablet Take 1 tablet (10 mg total) by mouth daily. 90 tablet 1  . famotidine (PEPCID) 20 MG tablet Take 1 tablet (20 mg total) by mouth 2 (two) times daily as needed for heartburn or indigestion. 180 tablet 1  . fenofibrate (TRICOR) 145 MG tablet Take 1 tablet Daily for Blood Fats (Triglycerides) (Patient taking differently: Take 145 mg by mouth daily. ) 90 tablet 3  . lamoTRIgine (LAMICTAL) 100 MG tablet Take 1 tablet (100 mg total) by mouth daily. 90 tablet 1  . ondansetron (ZOFRAN) 4 MG tablet Take 1 to 2 tablets 2 to 3 x / day if needed for Nausea (Patient taking differently: Take 4-8 mg by mouth 3 (three) times daily as needed for nausea. ) 60 tablet 1  . rOPINIRole (REQUIP) 1 MG tablet Take 1/2 to 1 or 2 tablets at night for  Restless Legs (Patient taking differently: Take 0.5-2 mg by mouth at bedtime as needed (restless legs). ) 180 tablet 3  . SUMAtriptan (IMITREX) 25 MG tablet Take 1-2 tab with onset of migraine. May repeat in 2 hours if headache persists. Max 4 tabs in 24 hours. (Patient taking differently: Take 25-50 mg by mouth See admin instructions. Take 1-2 tab with onset of migraine. May repeat in 2 hours if headache persists. Max 4 tabs in 24 hours.) 30 tablet 0   No current facility-administered medications on file prior to visit.     Allergies  Allergen Reactions  . Erythromycin Other (See Comments)    TBS  . Iodinated Diagnostic Agents Other (See Comments)    Hot flashes, itching scalp Hot flashes, itching scalp  . Penicillins Hives  . Clarithromycin Other (See Comments)    Itching ankles   . Brintellix [Vortioxetine]     Itching    Current Problems (verified) Patient Active Problem List   Diagnosis Date Noted  . CKD (chronic kidney disease) stage 3, GFR 30-59 ml/min 04/11/2019  . Osteopenia 04/06/2018  . Fatty liver 08/29/2017  . Migraines 03/25/2017  . Circadian rhythm sleep disorder 03/25/2017  . Periodic limb movement disorder (PLMD) 03/25/2017  . Overweight (BMI 25.0-29.9) 06/16/2015  . Medication management 06/15/2015  . Other abnormal glucose (prediabetes) 11/09/2014  . Vitamin D deficiency 11/09/2014  . GERD  11/09/2014  . Extrinsic asthma 11/09/2014  . Major depression, recurrent, chronic ([place]) 06/07/2014  . OSA on CPAP   . Mixed hyperlipidemia 06/29/2013  . ADD (attention deficit disorder) 06/29/2013    Screening Tests Immunization History  Administered Date(s) Administered  . Influenza, High Dose Seasonal PF 06/12/2016, 05/13/2017  . Influenza-Unspecified 06/02/2015, 05/19/2018, 05/19/2019  . PPD Test 08/19/2011, 08/30/2013, 11/09/2014  . Pneumococcal Conjugate-13 08/18/2009, 02/18/2019  . Pneumococcal Polysaccharide-23 06/12/2016  . Td 08/19/2003  . Tdap 08/30/2013  . Zoster 08/18/2010   Tetanus: 2015 Pneumovax: 2017 Prevnar: 2011, 2020 Flu vaccine: 05/2019 Zostavax: 2012  PAP: 2019, s/p TAH, done  MGM: 12/2017 patient will schedule *** DEXA: 08/2018 - L fem T -2.0  CXR: 03/2016 Colonoscopy: 09/2011 EGD: 2019  Names of Other Physician/Practitioners you currently use: 1. [facility] here for primary care 2. Dr. Idolina Primer, eye doctor, last visit ***, goes annually 3. Dr. Tama Gander, dentist, last visit ***, goes q49m  Patient Care Team: Unk Pinto, MD as PCP - General (Internal Medicine) Clarene Essex, MD as Consulting Physician (Gastroenterology) Royston Sinner Colin Benton, MD as Consulting Physician (Obstetrics and Gynecology)  SURGICAL HISTORY She  has a past surgical history that includes perforated  eardrum; Nasal sinus surgery; Tonsillectomy and adenoidectomy; Breast biopsy (Right, 02/27/2014); Cholecystectomy (11/03/2017); Laparoscopic hysterectomy (12/2017); Ureteroscopy; and ORIF ankle fracture bimalleolar (Left, 03/14/2019). FAMILY HISTORY Her family history includes Breast cancer in her cousin; Breast cancer (age of onset: 86) in her maternal aunt; Breast cancer (age of onset: 54) in her maternal grandmother; Colon cancer (age of onset: 55) in her paternal grandmother; Diabetes in her mother and sister; Heart failure in her father; Heart failure (age of onset: 70) in her maternal grandfather; Osteoporosis in her brother; Stomach cancer in her maternal aunt. SOCIAL HISTORY She  reports that she has never smoked. She has never used smokeless tobacco. She reports current alcohol use of about 1.0 standard drinks of alcohol per week. She reports that she does not use drugs.   MEDICARE WELLNESS OBJECTIVES: Physical activity:   Cardiac risk factors:   Depression/mood  screen:   Depression screen PHQ 2/9 08/16/2018  Decreased Interest 3  Down, Depressed, Hopeless 3  PHQ - 2 Score 6  Altered sleeping 3  Tired, decreased energy 3  Change in appetite 3  Feeling bad or failure about yourself  2  Trouble concentrating 2  Moving slowly or fidgety/restless 3  Suicidal thoughts 0  PHQ-9 Score 22  Difficult doing work/chores Very difficult    ADLs:  No flowsheet data found.   Cognitive Testing  Alert? Yes  Normal Appearance?Yes  Oriented to person? Yes  Place? Yes   Time? Yes  Recall of three objects?  Yes  Can perform simple calculations? Yes  Displays appropriate judgment?Yes  Can read the correct time from a watch face?Yes  EOL planning:    Review of Systems  Constitutional: Negative for malaise/fatigue and weight loss.  HENT: Negative for hearing loss and tinnitus.   Eyes: Negative for blurred vision and double vision.  Respiratory: Negative for cough, sputum production,  shortness of breath and wheezing.   Cardiovascular: Negative for chest pain, palpitations, orthopnea, claudication, leg swelling and PND.  Gastrointestinal: Negative for abdominal pain, blood in stool, constipation, diarrhea, heartburn, melena, nausea and vomiting.  Genitourinary: Negative.   Musculoskeletal: Negative for falls, joint pain and myalgias.  Skin: Negative for rash.  Neurological: Negative for dizziness, tingling, sensory change, weakness and headaches.  Endo/Heme/Allergies: Negative for polydipsia.  Psychiatric/Behavioral: Positive for depression. Negative for hallucinations, memory loss, substance abuse and suicidal ideas. The patient is not nervous/anxious and does not have insomnia.   All other systems reviewed and are negative.    Objective:     There were no vitals filed for this visit. There is no height or weight on file to calculate BMI.  General appearance: alert, no distress, WD/WN, female HEENT: normocephalic, sclerae anicteric, TMs pearly, nares patent, no discharge or erythema, pharynx normal Oral cavity: MMM, no lesions Neck: supple, no lymphadenopathy, no thyromegaly, no masses Heart: RRR, normal S1, S2, no murmurs Lungs: CTA bilaterally, no wheezes, rhonchi, or rales Abdomen: +bs, soft, non tender, non distended, no masses, no hepatomegaly, no splenomegaly Musculoskeletal: nontender, no swelling, no obvious deformity Extremities: no edema, no cyanosis, no clubbing Pulses: 2+ symmetric, upper and lower extremities, normal cap refill Neurological: alert, oriented x 3, CN2-12 intact, strength normal upper extremities and lower extremities, sensation normal throughout, DTRs 2+ throughout, no cerebellar signs, gait normal Psychiatric: normal affect, behavior normal, pleasant   Medicare Attestation I have personally reviewed: The patient's medical and social history Their use of alcohol, tobacco or illicit drugs Their current medications and supplements The  patient's functional ability including ADLs,fall risks, home safety risks, cognitive, and hearing and visual impairment Diet and physical activities Evidence for depression or mood disorders  The patient's weight, height, BMI, and visual acuity have been recorded in the chart.  I have made referrals, counseling, and provided education to the patient based on review of the above and I have provided the patient with a written personalized care plan for preventive services.     Izora Ribas, NP   07/19/2019

## 2019-07-20 ENCOUNTER — Ambulatory Visit: Payer: Medicare HMO | Admitting: Adult Health

## 2019-07-25 NOTE — Progress Notes (Deleted)
MEDICARE ANNUAL WELLNESS VISIT AND FOLLOW UP  Assessment:    Encounter for annual medicare wellness visit Due annually   Hyperlipidemia - newly on zetia, continue fenofibrate; consider switching to low dose rosuvastatin if remains above goal *** -cont diet and exercise - Lipid panel   Hyperglycemia Discussed general issues about diabetes pathophysiology and management., Educational material distributed., Suggested low cholesterol diet., Encouraged aerobic exercise., Discussed foot care., Reminded to get yearly retinal exam.   Essential hypertension - mild, currrently at goal off of medication, DASH diet, exercise and monitor at home. Call if greater than 130/80.  - TSH   Vitamin D deficiency At goal at recent check; continue to recommend supplementation for goal of 60-100 Defer vitamin D level  Osteopenia - get dexa q2y, due 2022, continue Vit D and Ca, weight bearing exercises  ADD (attention deficit disorder) Continue medications:  Helps with focus, no AE's. The patient was counseled on the addictive nature of the medication and was encouraged to take drug holidays when not needed.   CKD 3 Increase fluids, avoid NSAIDS, monitor sugars, will monitor  Fatty liver Weight loss advised, avoid alcohol/tylenol, will monitor LFTs  OSA on CPAP Endorses 100 % compliance  Extrinsic asthma without complication, unspecified asthma severity, unspecified whether persistent Avoid triggers  Gastroesophageal reflux disease, esophagitis presence not specified Famotidine *** Discussed diet, avoiding triggers and other lifestyle changes  RLS (restless legs syndrome) Monitor iron, magnesium, increase walking if not getting better can try going back to neuro  Migraines ***  Recurrent Major depression, chronic ([place]) Continue medications She will schedule with psych due to ongoing poor control of depression following retirement and with covid 19 ***  Overweight Long discussion  about weight loss, diet, and exercise Recommended diet heavy in fruits and veggies and low in animal meats, cheeses, and dairy products, appropriate calorie intake Discussed appropriate weight for height  Follow up at next visit    Over 40 minutes of exam, counseling, chart review and critical decision making was performed Future Appointments  Date Time Provider [place]  07/27/2019  9:00 AM Liane Comber, NP [facility] None  10/27/2019  9:30 AM Unk Pinto, MD [facility] None  04/11/2020 10:00 AM Liane Comber, NP [facility] None     Plan:   During the course of the visit the patient was educated and counseled about appropriate screening and preventive services including:    Pneumococcal vaccine   Prevnar 13  Influenza vaccine  Td vaccine  Screening electrocardiogram  Bone densitometry screening  Colorectal cancer screening  Diabetes screening  Glaucoma screening  Nutrition counseling   Advanced directives: requested   Subjective:  Kimberly Ray is a 68 y.o. female who presents for Medicare Annual Wellness Visit and 3 month follow up.   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   She had left ankle ORIF via [facility] in 02/2019 after tripping down a step going to a pool and in boot and wheelchair; following with Dr. Sheran Lawless. Doing PT at home.  Mom is helping take care of her at home.   Last year she experienced self-limiting uterine bleeding, workup by GYN demonstrated an endometrial stripe of 4 mm, an ultrasound that showed a 10 mm irregular cyst and laparoscopic findings notable for an adnexa with excrescences.  CA125 was 6.  She underwent hysterectomy bilateral salpingo-oophorectomy and a minimally invasive fashion on Dec 23, 2017. ***  She has long hx of severe depression and is on lamictal, wellbutrin, cymbalta and ADD  medication and remains poorly controlled; worse since retirement and with covid 19; after discussion she is  planning to schedule an appointment with psychiatry fur further management. ***  She has RLS symptoms, currently fairly managed by requip 1-2 mg nightly  She has OSA sleep study 3 years ago, now on CPAP, endorses 100% compliance and restorative sleep***  Asthma ***  Migraines ***  Has GERD and newly on famotidine ***  BMI is There is no height or weight on file to calculate BMI., she {HAS HAS [id number] been working on diet and exercise. Wt Readings from Last 3 Encounters:  05/06/19 191 lb 12.8 oz (87 kg)  02/16/19 192 lb (87.1 kg)  08/26/18 187 lb (84.8 kg)   Her BP is monitored expectantly with mild elevations intermittently. Never on medications.   Her blood pressure {HAS HAS NOT:18834} been controlled at home, today their BP is   She {DOES_DOES [id number] workout. She denies chest pain, shortness of breath, dizziness.   She is on cholesterol medication (newly on zetia 10 mg, on fenofibrate 145 mg daily) and denies myalgias. Her cholesterol is not at goal. The cholesterol last visit was:   Lab Results  Component Value Date   CHOL 216 (H) 04/12/2019   HDL 53 04/12/2019   LDLCALC 118 (H) 04/12/2019   TRIG 317 (H) 04/12/2019   CHOLHDL 4.1 04/12/2019   She has had prediabetes predating 2014. She {Has/has not:18111} been working on diet and exercise for prediabetes, and denies {Symptoms; diabetes w/o none:19199}. Last A1C in the office was:  Lab Results  Component Value Date   HGBA1C 6.3 (H) 04/12/2019   She has stable CKD IIIa monitored at this office. Last GFR: Lab Results  Component Value Date   GFRNONAA 51 (L) 04/12/2019   Patient is on Vitamin D supplement and was at goal at last check:    Lab Results  Component Value Date   VD25OH 79 04/12/2019      Medication Review: Current Outpatient Medications on File Prior to Visit  Medication Sig Dispense Refill  . acetaminophen (TYLENOL) 650 MG CR tablet Take 650 mg by mouth every 8 (eight) hours as needed for pain.    Marland Kitchen  amphetamine-dextroamphetamine (ADDERALL) 10 MG tablet Take 1   To 1 &1/2  Tablets 1 or 2 x /day for Focus & Concentration (Patient not taking: Reported on 04/12/2019) 90 tablet 0  . buPROPion (WELLBUTRIN XL) 300 MG 24 hr tablet Take 1 tablet (300 mg total) by mouth daily. 90 tablet 1  . DULoxetine (CYMBALTA) 60 MG capsule Take 1 capsule Daily for Mood 90 capsule 3  . ezetimibe (ZETIA) 10 MG tablet Take 1 tablet (10 mg total) by mouth daily. 90 tablet 1  . famotidine (PEPCID) 20 MG tablet Take 1 tablet (20 mg total) by mouth 2 (two) times daily as needed for heartburn or indigestion. 180 tablet 1  . fenofibrate (TRICOR) 145 MG tablet Take 1 tablet Daily for Blood Fats (Triglycerides) (Patient taking differently: Take 145 mg by mouth daily. ) 90 tablet 3  . lamoTRIgine (LAMICTAL) 100 MG tablet Take 1 tablet (100 mg total) by mouth daily. 90 tablet 1  . ondansetron (ZOFRAN) 4 MG tablet Take 1 to 2 tablets 2 to 3 x / day if needed for Nausea (Patient taking differently: Take 4-8 mg by mouth 3 (three) times daily as needed for nausea. ) 60 tablet 1  . rOPINIRole (REQUIP) 1 MG tablet Take 1/2 to 1 or 2 tablets at night  for Restless Legs (Patient taking differently: Take 0.5-2 mg by mouth at bedtime as needed (restless legs). ) 180 tablet 3  . SUMAtriptan (IMITREX) 25 MG tablet Take 1-2 tab with onset of migraine. May repeat in 2 hours if headache persists. Max 4 tabs in 24 hours. (Patient taking differently: Take 25-50 mg by mouth See admin instructions. Take 1-2 tab with onset of migraine. May repeat in 2 hours if headache persists. Max 4 tabs in 24 hours.) 30 tablet 0   No current facility-administered medications on file prior to visit.     Allergies  Allergen Reactions  . Erythromycin Other (See Comments)    TBS  . Iodinated Diagnostic Agents Other (See Comments)    Hot flashes, itching scalp Hot flashes, itching scalp  . Penicillins Hives  . Clarithromycin Other (See Comments)    Itching ankles   . Brintellix [Vortioxetine]     Itching    Current Problems (verified) Patient Active Problem List   Diagnosis Date Noted  . CKD (chronic kidney disease) stage 3, GFR 30-59 ml/min 04/11/2019  . Osteopenia 04/06/2018  . Fatty liver 08/29/2017  . Migraines 03/25/2017  . Circadian rhythm sleep disorder 03/25/2017  . Periodic limb movement disorder (PLMD) 03/25/2017  . Overweight (BMI 25.0-29.9) 06/16/2015  . Medication management 06/15/2015  . Other abnormal glucose (prediabetes) 11/09/2014  . Vitamin D deficiency 11/09/2014  . GERD  11/09/2014  . Extrinsic asthma 11/09/2014  . Major depression, recurrent, chronic ([place]) 06/07/2014  . OSA on CPAP   . Mixed hyperlipidemia 06/29/2013  . ADD (attention deficit disorder) 06/29/2013    Screening Tests Immunization History  Administered Date(s) Administered  . Influenza, High Dose Seasonal PF 06/12/2016, 05/13/2017  . Influenza-Unspecified 06/02/2015, 05/19/2018, 05/19/2019  . PPD Test 08/19/2011, 08/30/2013, 11/09/2014  . Pneumococcal Conjugate-13 08/18/2009, 02/18/2019  . Pneumococcal Polysaccharide-23 06/12/2016  . Td 08/19/2003  . Tdap 08/30/2013  . Zoster 08/18/2010   Tetanus: 2015 Pneumovax: 2017 Prevnar: 2011, 2020 Flu vaccine: 05/2019 Zostavax: 2012  PAP: 2019, s/p TAH, done  MGM: 12/2017 patient will schedule *** DEXA: 08/2018 - L fem T -2.0  CXR: 03/2016 Colonoscopy: 09/2011 EGD: 2019  Names of Other Physician/Practitioners you currently use: 1. [facility] here for primary care 2. Dr. Idolina Primer, eye doctor, last visit ***, goes annually 3. Dr. Tama Gander, dentist, last visit ***, goes q35m  Patient Care Team: Unk Pinto, MD as PCP - General (Internal Medicine) Clarene Essex, MD as Consulting Physician (Gastroenterology) Royston Sinner Colin Benton, MD as Consulting Physician (Obstetrics and Gynecology)  SURGICAL HISTORY She  has a past surgical history that includes perforated  eardrum; Nasal sinus surgery; Tonsillectomy and adenoidectomy; Breast biopsy (Right, 02/27/2014); Cholecystectomy (11/03/2017); Laparoscopic hysterectomy (12/2017); Ureteroscopy; and ORIF ankle fracture bimalleolar (Left, 03/14/2019). FAMILY HISTORY Her family history includes Breast cancer in her cousin; Breast cancer (age of onset: 44) in her maternal aunt; Breast cancer (age of onset: 61) in her maternal grandmother; Colon cancer (age of onset: 1) in her paternal grandmother; Diabetes in her mother and sister; Heart failure in her father; Heart failure (age of onset: 36) in her maternal grandfather; Osteoporosis in her brother; Stomach cancer in her maternal aunt. SOCIAL HISTORY She  reports that she has never smoked. She has never used smokeless tobacco. She reports current alcohol use of about 1.0 standard drinks of alcohol per week. She reports that she does not use drugs.   MEDICARE WELLNESS OBJECTIVES: Physical activity:   Cardiac risk factors:  Depression/mood screen:   Depression screen [facility] 2/9 08/16/2018  Decreased Interest 3  Down, Depressed, Hopeless 3  PHQ - 2 Score 6  Altered sleeping 3  Tired, decreased energy 3  Change in appetite 3  Feeling bad or failure about yourself  2  Trouble concentrating 2  Moving slowly or fidgety/restless 3  Suicidal thoughts 0  PHQ-9 Score 22  Difficult doing work/chores Very difficult    ADLs:  No flowsheet data found.   Cognitive Testing  Alert? Yes  Normal Appearance?Yes  Oriented to person? Yes  Place? Yes   Time? Yes  Recall of three objects?  Yes  Can perform simple calculations? Yes  Displays appropriate judgment?Yes  Can read the correct time from a watch face?Yes  EOL planning:    Review of Systems  Constitutional: Negative for malaise/fatigue and weight loss.  HENT: Negative for hearing loss and tinnitus.   Eyes: Negative for blurred vision and double vision.  Respiratory: Negative for cough, sputum production,  shortness of breath and wheezing.   Cardiovascular: Negative for chest pain, palpitations, orthopnea, claudication, leg swelling and PND.  Gastrointestinal: Negative for abdominal pain, blood in stool, constipation, diarrhea, heartburn, melena, nausea and vomiting.  Genitourinary: Negative.   Musculoskeletal: Negative for falls, joint pain and myalgias.  Skin: Negative for rash.  Neurological: Negative for dizziness, tingling, sensory change, weakness and headaches.  Endo/Heme/Allergies: Negative for polydipsia.  Psychiatric/Behavioral: Positive for depression. Negative for hallucinations, memory loss, substance abuse and suicidal ideas. The patient is not nervous/anxious and does not have insomnia.   All other systems reviewed and are negative.    Objective:     There were no vitals filed for this visit. There is no height or weight on file to calculate BMI.  General appearance: alert, no distress, WD/WN, female HEENT: normocephalic, sclerae anicteric, TMs pearly, nares patent, no discharge or erythema, pharynx normal Oral cavity: MMM, no lesions Neck: supple, no lymphadenopathy, no thyromegaly, no masses Heart: RRR, normal S1, S2, no murmurs Lungs: CTA bilaterally, no wheezes, rhonchi, or rales Abdomen: +bs, soft, non tender, non distended, no masses, no hepatomegaly, no splenomegaly Musculoskeletal: nontender, no swelling, no obvious deformity Extremities: no edema, no cyanosis, no clubbing Pulses: 2+ symmetric, upper and lower extremities, normal cap refill Neurological: alert, oriented x 3, CN2-12 intact, strength normal upper extremities and lower extremities, sensation normal throughout, DTRs 2+ throughout, no cerebellar signs, gait normal Psychiatric: normal affect, behavior normal, pleasant   Medicare Attestation I have personally reviewed: The patient's medical and social history Their use of alcohol, tobacco or illicit drugs Their current medications and supplements The  patient's functional ability including ADLs,fall risks, home safety risks, cognitive, and hearing and visual impairment Diet and physical activities Evidence for depression or mood disorders  The patient's weight, height, BMI, and visual acuity have been recorded in the chart.  I have made referrals, counseling, and provided education to the patient based on review of the above and I have provided the patient with a written personalized care plan for preventive services.     Izora Ribas, NP   07/25/2019

## 2019-07-27 ENCOUNTER — Ambulatory Visit: Payer: Medicare HMO | Admitting: Adult Health

## 2019-07-31 IMAGING — NM NM HEPATO W/GB/PHARM/[PERSON_NAME]
2 series · 12 of 12 positions shown · non-contrast
Comparison: 08/28/2017 ultrasound.

CLINICAL DATA: 66-year-old female with nausea, vomiting and reflux
with spicy foods starting 2 years ago. Subsequent encounter.

EXAM:
NUCLEAR MEDICINE HEPATOBILIARY IMAGING WITH GALLBLADDER EF
TECHNIQUE: Sequential images of the abdomen were obtained [DATE] minutes
following intravenous administration of radiopharmaceutical. After
oral ingestion of Ensure, gallbladder ejection fraction was
determined. At 60 min, normal ejection fraction is greater than 33%.
RADIOPHARMACEUTICALS:  5.1 mCi Oc-MMm Choletec IV

[he hepatobiliary · 3.10mm/px · 6 of 60 frames shown (1 of 2)]
[frame 6/60]
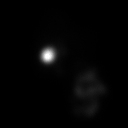
[frame 16/60]
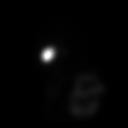
[frame 26/60]
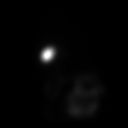
[frame 36/60]
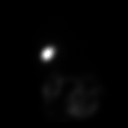
[frame 46/60]
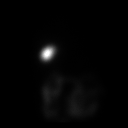
[frame 56/60]
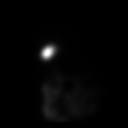

[he hepatobiliary · 3.10mm/px · 6 of 57 frames shown (2 of 2)]
[frame 5/57]
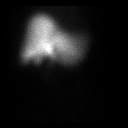
[frame 15/57]
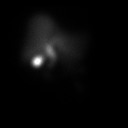
[frame 24/57]
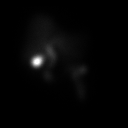
[frame 34/57]
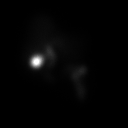
[frame 43/57]
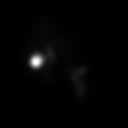
[frame 53/57]
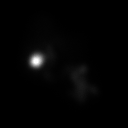

[12 of 12 positions shown; findings below may reference images not displayed]

FINDINGS: Prompt uptake and biliary excretion of activity by the liver is
seen. Gallbladder activity is visualized, consistent with patency of
cystic duct. Biliary activity passes into small bowel, consistent
with patent common bile duct.

Calculated gallbladder ejection fraction is 13%. (Normal gallbladder
ejection fraction with Ensure is greater than 33%.)

Patient did not experience discomfort with ingestion of Ensure.
IMPRESSION: Decreased gallbladder ejection fraction of 13% (normal gallbladder
ejection fraction with ingestion of Ensure at 60 minutes is 33%).

## 2019-08-01 DIAGNOSIS — M25572 Pain in left ankle and joints of left foot: Secondary | ICD-10-CM | POA: Diagnosis not present

## 2019-08-03 NOTE — Progress Notes (Signed)
MEDICARE ANNUAL WELLNESS VISIT AND FOLLOW UP  Assessment:    Encounter for annual medicare wellness visit Due annually   Hyperlipidemia - newly on zetia, continue fenofibrate; consider switching to low dose rosuvastatin if remains above goal  -cont diet and exercise - Lipid panel   Hyperglycemia Discussed general issues about diabetes pathophysiology and management., Educational material distributed., Suggested low cholesterol diet., Encouraged aerobic exercise., Discussed foot care., Reminded to get yearly retinal exam.   Essential hypertension - mild, currrently at goal off of medication, DASH diet, exercise and monitor at home. Call if greater than 130/80.  - TSH   Vitamin D deficiency At goal at recent check; continue to recommend supplementation for goal of 60-100 Defer vitamin D level  Osteopenia - get dexa q2y, due 2022, continue Vit D and Ca, weight bearing exercises  ADD (attention deficit disorder) Off of medication since retirement; was restarted on adderall briefly this year then stopped Patient can't recall why  Will defer to psychiatry whether to restart  CKD 3 Increase fluids, avoid NSAIDS, monitor sugars, will monitor  Fatty liver Weight loss advised, avoid alcohol/tylenol, will monitor LFTs  OSA on CPAP Endorses 100 % compliance  Gastroesophageal reflux disease, esophagitis presence not specified Increase famotidine to BID; if not improved in 1 week message and will add on PPI, continue carafate in the interim Discussed diet, avoiding triggers and other lifestyle changes  RLS (restless legs syndrome) Monitor iron, magnesium, increase walking Reports recently well controlled with medications  Recurrent Major depression, chronic (HCC) Severe depression poorly controlled by current medications Pending evaluation by psych Brief visual hallucinations in the evening; ? cymbalta may be contributing per discussion with Dr. Melford Aase, will reduce from 60 mg  to 30 mg, will trial addition of low dose of seroquel; close follow up in 4-6 weeks  Follow up sooner with any SE or concerns prior Denies HI/SI She will schedule with psych due to ongoing poor control of depression following retirement  Overweight Long discussion about weight loss, diet, and exercise Recommended diet heavy in fruits and veggies and low in animal meats, cheeses, and dairy products, appropriate calorie intake Discussed appropriate weight for height  Follow up at next visit  Screening breast cancer Patient will schedule mammogram with breast center; # given  Decreased hearing Information for free hearing tests provided   Memory changes ? R/t severe depression, MMSE normal She will follow up with psych for management of severe mood Close follow up in 4-6 weeks; refer to neurology if no improvement   Over 40 minutes of exam, counseling, chart review and critical decision making was performed Future Appointments  Date Time Provider [place]  09/01/2019  3:30 PM Liane Comber, NP [facility] None  10/27/2019  9:30 AM Unk Pinto, MD [facility] None  04/11/2020 10:00 AM Liane Comber, NP [facility] None     Plan:   During the course of the visit the patient was educated and counseled about appropriate screening and preventive services including:    Pneumococcal vaccine   Prevnar 13  Influenza vaccine  Td vaccine  Screening electrocardiogram  Bone densitometry screening  Colorectal cancer screening  Diabetes screening  Glaucoma screening  Nutrition counseling   Advanced directives: requested   Subjective:  Kimberly Ray is a 68 y.o. female who presents for Medicare Annual Wellness Visit and 3 month follow up.   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   She had left ankle ORIF via [facility] in 02/2019 after tripping  down a step going to a pool and in boot and wheelchair; following with Dr. Sheran Lawless. Was doing PT at home  but finished. Still feels a bit unstable. Plans to restart silver sneakers.   Last year she experienced self-limiting uterine bleeding, workup by GYN demonstrated an endometrial stripe of 4 mm, an ultrasound that showed a 10 mm irregular cyst and laparoscopic findings notable for an adnexa with excrescences.  CA125 was 6.  She underwent hysterectomy bilateral salpingo-oophorectomy and a minimally invasive fashion on Dec 23, 2017.   She has long hx of severe depression and is on lamictal, wellbutrin, cymbalta and remains poorly controlled; worse since retirement and with covid 19; after discussion she is planning to schedule an appointment with psychiatry fur further management but states she is changing insurance and reports is waiting until January 2021. She reports since worsening mood has noted poor memory and focus, has trouble completing complex tasks; also has had some hallucinations in the evenings, thinking her sister was in the room with her or saw a mouth in her bed when she objectively knew that could not be.   She has RLS symptoms, currently fairly managed by requip 1-2 mg nightly  She has OSA sleep study 3 years ago, now on CPAP, endorses 100% compliance and feels this has helped RLS. Typically wears around 5 hours each night.  Has GERD and newly on famotidine, has only been taking once daily.   BMI is Body mass index is 29.76 kg/m., she has not been working on diet and exercise. Wt Readings from Last 3 Encounters:  08/04/19 190 lb (86.2 kg)  05/06/19 191 lb 12.8 oz (87 kg)  02/16/19 192 lb (87.1 kg)   Her BP is monitored expectantly with mild elevations intermittently. Never on medications.  She does not check BP at home, today their BP is BP: 128/76 She does not workout. She denies chest pain, shortness of breath, dizziness.   She is on cholesterol medication (newly on zetia 10 mg, on fenofibrate 145 mg daily) and denies myalgias. Her cholesterol is not at goal. The cholesterol  last visit was:   Lab Results  Component Value Date   CHOL 216 (H) 04/12/2019   HDL 53 04/12/2019   LDLCALC 118 (H) 04/12/2019   TRIG 317 (H) 04/12/2019   CHOLHDL 4.1 04/12/2019   She has had prediabetes predating 2014. She has not been working on diet and exercise for prediabetes, and denies increased appetite, nausea, paresthesia of the feet, polydipsia, polyuria, visual disturbances, vomiting and weight loss. Last A1C in the office was:  Lab Results  Component Value Date   HGBA1C 6.3 (H) 04/12/2019   She has stable CKD IIIa monitored at this office. Last GFR: Lab Results  Component Value Date   GFRNONAA 68 (L) 04/12/2019   Patient is on Vitamin D supplement and was at goal at last check:    Lab Results  Component Value Date   VD25OH 79 04/12/2019      Medication Review: Current Outpatient Medications on File Prior to Visit  Medication Sig Dispense Refill  . acetaminophen (TYLENOL) 650 MG CR tablet Take 650 mg by mouth every 8 (eight) hours as needed for pain.    Marland Kitchen buPROPion (WELLBUTRIN XL) 300 MG 24 hr tablet Take 1 tablet (300 mg total) by mouth daily. 90 tablet 1  . ezetimibe (ZETIA) 10 MG tablet Take 1 tablet (10 mg total) by mouth daily. 90 tablet 1  . famotidine (PEPCID) 20 MG tablet Take  1 tablet (20 mg total) by mouth 2 (two) times daily as needed for heartburn or indigestion. 180 tablet 1  . fenofibrate (TRICOR) 145 MG tablet Take 1 tablet Daily for Blood Fats (Triglycerides) (Patient taking differently: Take 145 mg by mouth daily. ) 90 tablet 3  . lamoTRIgine (LAMICTAL) 100 MG tablet Take 1 tablet (100 mg total) by mouth daily. 90 tablet 1  . rOPINIRole (REQUIP) 1 MG tablet Take 1/2 to 1 or 2 tablets at night for Restless Legs (Patient taking differently: Take 0.5-2 mg by mouth at bedtime as needed (restless legs). ) 180 tablet 3  . SUMAtriptan (IMITREX) 25 MG tablet Take 1-2 tab with onset of migraine. May repeat in 2 hours if headache persists. Max 4 tabs in 24 hours.  (Patient taking differently: Take 25-50 mg by mouth See admin instructions. Take 1-2 tab with onset of migraine. May repeat in 2 hours if headache persists. Max 4 tabs in 24 hours.) 30 tablet 0   No current facility-administered medications on file prior to visit.    Allergies  Allergen Reactions  . Erythromycin Other (See Comments)    TBS  . Iodinated Diagnostic Agents Other (See Comments)    Hot flashes, itching scalp Hot flashes, itching scalp  . Penicillins Hives  . Clarithromycin Other (See Comments)    Itching ankles  . Brintellix [Vortioxetine]     Itching    Current Problems (verified) Patient Active Problem List   Diagnosis Date Noted  . Memory changes 08/04/2019  . CKD (chronic kidney disease) stage 3, GFR 30-59 ml/min 04/11/2019  . Osteopenia 04/06/2018  . Fatty liver 08/29/2017  . Circadian rhythm sleep disorder 03/25/2017  . Periodic limb movement disorder (PLMD) 03/25/2017  . Overweight (BMI 25.0-29.9) 06/16/2015  . Medication management 06/15/2015  . Other abnormal glucose (prediabetes) 11/09/2014  . Vitamin D deficiency 11/09/2014  . GERD  11/09/2014  . Major depression, recurrent, chronic ([place]) 06/07/2014  . OSA on CPAP   . Mixed hyperlipidemia 06/29/2013  . ADD (attention deficit disorder) 06/29/2013    Screening Tests Immunization History  Administered Date(s) Administered  . Influenza, High Dose Seasonal PF 06/12/2016, 05/13/2017  . Influenza-Unspecified 06/02/2015, 05/19/2018, 05/19/2019  . PPD Test 08/19/2011, 08/30/2013, 11/09/2014  . Pneumococcal Conjugate-13 08/18/2009, 02/18/2019  . Pneumococcal Polysaccharide-23 06/12/2016  . Td 08/19/2003  . Tdap 08/30/2013  . Zoster 08/18/2010   Tetanus: 2015 Pneumovax: 2017 Prevnar: 2011, 2020 Flu vaccine: 05/2019 Zostavax: 2012  PAP: 2019, s/p TAH, done  MGM: 12/2017 patient will schedule -  DEXA: 08/2018 - L fem T -2.0  CXR: 03/2016 Colonoscopy: 09/2011 EGD: 2019  Names of Other  Physician/Practitioners you currently use: 1. [facility] here for primary care 2. Dr. Idolina Primer, eye doctor, last visit 2019, goes annually 3. Dr. Tama Gander, dentist, last visit 2020, goes q52m  Patient Care Team: Unk Pinto, MD as PCP - General (Internal Medicine) Clarene Essex, MD as Consulting Physician (Gastroenterology) Royston Sinner Colin Benton, MD as Consulting Physician (Obstetrics and Gynecology)  SURGICAL HISTORY She  has a past surgical history that includes perforated eardrum; Nasal sinus surgery; Tonsillectomy and adenoidectomy; Breast biopsy (Right, 02/27/2014); Cholecystectomy (11/03/2017); Laparoscopic hysterectomy (12/2017); Ureteroscopy; and ORIF ankle fracture bimalleolar (Left, 03/14/2019). FAMILY HISTORY Her family history includes Breast cancer in her cousin; Breast cancer (age of onset: 70) in her maternal aunt; Breast cancer (age of onset: 60) in her maternal grandmother; Colon cancer (age of onset: 56) in her paternal grandmother; Diabetes in her mother  and sister; Heart failure in her father; Heart failure (age of onset: 45) in her maternal grandfather; Osteoporosis in her brother; Stomach cancer in her maternal aunt. SOCIAL HISTORY She  reports that she has never smoked. She has never used smokeless tobacco. She reports current alcohol use of about 1.0 standard drinks of alcohol per week. She reports that she does not use drugs.   MEDICARE WELLNESS OBJECTIVES: Physical activity: Current Exercise Habits: The patient does not participate in regular exercise at present, Exercise limited by: orthopedic condition(s) Cardiac risk factors: Cardiac Risk Factors include: advanced age (>45men, >23 women);sedentary lifestyle;obesity (BMI >30kg/m2);dyslipidemia;hypertension Depression/mood screen:   Depression screen [facility] 2/9 08/04/2019  Decreased Interest 2  Down, Depressed, Hopeless 3  PHQ - 2 Score 5  Altered sleeping 3  Tired, decreased  energy 3  Change in appetite 3  Feeling bad or failure about yourself  2  Trouble concentrating 3  Moving slowly or fidgety/restless 2  Suicidal thoughts 0  PHQ-9 Score 21  Difficult doing work/chores Very difficult    ADLs:  In your present state of health, do you have any difficulty performing the following activities: 08/04/2019  Hearing? Y  Vision? N  Difficulty concentrating or making decisions? Y  Comment difficulty focusing, completing complex tasks  Walking or climbing stairs? N  Dressing or bathing? N  Doing errands, shopping? N  Some recent data might be hidden     Cognitive Testing  Alert? Yes  Normal Appearance?Yes  Oriented to person? Yes  Place? Yes   Time? Yes  Recall of three objects?  Yes  Can perform simple calculations? Yes  Displays appropriate judgment?Yes  Can read the correct time from a watch face?Yes  MMSE - Mini Mental State Exam 08/04/2019  Orientation to time 5  Orientation to Place 5  Registration 3  Attention/ Calculation 5  Recall 3  Language- name 2 objects 2  Language- repeat 1  Language- follow 3 step command 3  Language- read & follow direction 1  Write a sentence 1  Copy design 1  Total score 30     EOL planning: Does Patient Have a Medical Advance Directive?: Yes Type of Advance Directive: Healthcare Power of Attorney, Living will Does patient want to make changes to medical advance directive?: No - Patient declined Copy of [place] in Chart?: No - copy requested  Review of Systems  Constitutional: Negative for malaise/fatigue and weight loss.  HENT: Negative for hearing loss and tinnitus.   Eyes: Negative for blurred vision and double vision.  Respiratory: Negative for cough, sputum production, shortness of breath and wheezing.   Cardiovascular: Negative for chest pain, palpitations, orthopnea, claudication, leg swelling and PND.  Gastrointestinal: Negative for abdominal pain, blood in stool,  constipation, diarrhea, heartburn, melena, nausea and vomiting.  Genitourinary: Negative.   Musculoskeletal: Negative for falls, joint pain and myalgias.  Skin: Negative for rash.  Neurological: Negative for dizziness, tingling, sensory change, weakness and headaches.  Endo/Heme/Allergies: Negative for polydipsia.  Psychiatric/Behavioral: Positive for depression, hallucinations (brief, visual ) and memory loss. Negative for substance abuse and suicidal ideas. The patient is not nervous/anxious and does not have insomnia.   All other systems reviewed and are negative.    Objective:     Today's Vitals   08/04/19 1507  BP: 128/76  Pulse: 94  Temp: (!) 96.6 F (35.9 C)  SpO2: 98%  Weight: 190 lb (86.2 kg)   Body mass index is 29.76 kg/m.  General appearance: alert,  no distress, WD/WN, female HEENT: normocephalic, sclerae anicteric, TMs pearly, nares patent, no discharge or erythema, pharynx normal Oral cavity: MMM, no lesions Neck: supple, no lymphadenopathy, no thyromegaly, no masses Heart: RRR, normal S1, S2, no murmurs Lungs: CTA bilaterally, no wheezes, rhonchi, or rales Abdomen: +bs, soft, non tender, non distended, no masses, no hepatomegaly, no splenomegaly Musculoskeletal: nontender, no swelling, no obvious deformity, gait is slow and antalgic Extremities: no edema, no cyanosis, no clubbing Pulses: 2+ symmetric, upper and lower extremities, normal cap refill Neurological: alert, oriented x 3, CN2-12 intact, strength normal upper extremities and lower extremities, sensation normal throughout, DTRs 2+ throughout, no cerebellar signs, gait slow and antalgic Psychiatric: Depressed affect, behavior normal, pleasant  Medicare Attestation I have personally reviewed: The patient's medical and social history Their use of alcohol, tobacco or illicit drugs Their current medications and supplements The patient's functional ability including ADLs,fall risks, home safety risks,  cognitive, and hearing and visual impairment Diet and physical activities Evidence for depression or mood disorders  The patient's weight, height, BMI, and visual acuity have been recorded in the chart.  I have made referrals, counseling, and provided education to the patient based on review of the above and I have provided the patient with a written personalized care plan for preventive services.     Izora Ribas, NP   08/04/2019

## 2019-08-04 ENCOUNTER — Ambulatory Visit (INDEPENDENT_AMBULATORY_CARE_PROVIDER_SITE_OTHER): Payer: Medicare HMO | Admitting: Adult Health

## 2019-08-04 ENCOUNTER — Encounter: Payer: Self-pay | Admitting: Adult Health

## 2019-08-04 ENCOUNTER — Other Ambulatory Visit: Payer: Self-pay

## 2019-08-04 VITALS — BP 128/76 | HR 94 | Temp 96.6°F | Wt 190.0 lb

## 2019-08-04 DIAGNOSIS — E663 Overweight: Secondary | ICD-10-CM

## 2019-08-04 DIAGNOSIS — K760 Fatty (change of) liver, not elsewhere classified: Secondary | ICD-10-CM

## 2019-08-04 DIAGNOSIS — G4761 Periodic limb movement disorder: Secondary | ICD-10-CM | POA: Diagnosis not present

## 2019-08-04 DIAGNOSIS — R112 Nausea with vomiting, unspecified: Secondary | ICD-10-CM

## 2019-08-04 DIAGNOSIS — G43809 Other migraine, not intractable, without status migrainosus: Secondary | ICD-10-CM | POA: Diagnosis not present

## 2019-08-04 DIAGNOSIS — E559 Vitamin D deficiency, unspecified: Secondary | ICD-10-CM

## 2019-08-04 DIAGNOSIS — N1831 Chronic kidney disease, stage 3a: Secondary | ICD-10-CM | POA: Diagnosis not present

## 2019-08-04 DIAGNOSIS — E782 Mixed hyperlipidemia: Secondary | ICD-10-CM

## 2019-08-04 DIAGNOSIS — M8580 Other specified disorders of bone density and structure, unspecified site: Secondary | ICD-10-CM

## 2019-08-04 DIAGNOSIS — F339 Major depressive disorder, recurrent, unspecified: Secondary | ICD-10-CM

## 2019-08-04 DIAGNOSIS — R7309 Other abnormal glucose: Secondary | ICD-10-CM

## 2019-08-04 DIAGNOSIS — F988 Other specified behavioral and emotional disorders with onset usually occurring in childhood and adolescence: Secondary | ICD-10-CM

## 2019-08-04 DIAGNOSIS — R6889 Other general symptoms and signs: Secondary | ICD-10-CM

## 2019-08-04 DIAGNOSIS — J45909 Unspecified asthma, uncomplicated: Secondary | ICD-10-CM

## 2019-08-04 DIAGNOSIS — Z0000 Encounter for general adult medical examination without abnormal findings: Secondary | ICD-10-CM

## 2019-08-04 DIAGNOSIS — Z79899 Other long term (current) drug therapy: Secondary | ICD-10-CM | POA: Diagnosis not present

## 2019-08-04 DIAGNOSIS — K219 Gastro-esophageal reflux disease without esophagitis: Secondary | ICD-10-CM | POA: Diagnosis not present

## 2019-08-04 DIAGNOSIS — G4720 Circadian rhythm sleep disorder, unspecified type: Secondary | ICD-10-CM

## 2019-08-04 DIAGNOSIS — R69 Illness, unspecified: Secondary | ICD-10-CM | POA: Diagnosis not present

## 2019-08-04 DIAGNOSIS — G4733 Obstructive sleep apnea (adult) (pediatric): Secondary | ICD-10-CM

## 2019-08-04 DIAGNOSIS — Z0001 Encounter for general adult medical examination with abnormal findings: Secondary | ICD-10-CM | POA: Diagnosis not present

## 2019-08-04 DIAGNOSIS — R413 Other amnesia: Secondary | ICD-10-CM

## 2019-08-04 DIAGNOSIS — Z9989 Dependence on other enabling machines and devices: Secondary | ICD-10-CM

## 2019-08-04 MED ORDER — DULOXETINE HCL 30 MG PO CPEP: 30.0000 mg | ORAL_CAPSULE | Freq: Every day | ORAL | 2 refills | Status: DC

## 2019-08-04 MED ORDER — QUETIAPINE FUMARATE 25 MG PO TABS: 25.0000 mg | ORAL_TABLET | Freq: Every day | ORAL | 0 refills | Status: DC

## 2019-08-04 MED ORDER — ONDANSETRON 8 MG PO TBDP: 8.0000 mg | ORAL_TABLET | Freq: Three times a day (TID) | ORAL | 0 refills | Status: DC | PRN

## 2019-08-04 NOTE — Patient Instructions (Addendum)
Kimberly Ray , Thank you for taking time to come for your Medicare Wellness Visit. I appreciate your ongoing commitment to your health goals. Please review the following plan we discussed and let me know if I can assist you in the future.   These are the goals we discussed: Goals    . HEMOGLOBIN A1C < 5.7    . LDL CALC < 100    . Weight (lb) < 160 lb (72.6 kg)       This is a list of the screening recommended for you and due dates:  Health Maintenance  Topic Date Due  . Mammogram  01/06/2020  . Urine Protein Check  04/11/2020  . Colon Cancer Screening  10/12/2021  . Tetanus Vaccine  08/31/2023  . Flu Shot  Completed  . DEXA scan (bone density measurement)  Completed  .  Hepatitis C: One time screening is recommended by Center for Disease Control  (CDC) for  adults born from [birth year] through 1966.   Completed  . Pneumonia vaccines  Completed      HOW TO SCHEDULE A MAMMOGRAM  [facility]  7 a.m.-6:30 p.m., Monday 7 a.m.-5 p.m., Tuesday-Friday Schedule an appointment by calling [phone].  [facility] Schedule an appointment by calling [phone].    [employer] with no obligation # [phone] Do not have to be a member Tues-Sat 10-6  [place]- free test with no obligation # [phone] MUST BE A MEMBER Call for store hours  Have had patient's get good cheaper hearing aids from [employer] The air version has good reviews.        Quetiapine tablets What is this medicine? QUETIAPINE (kwe TYE a peen) is an antipsychotic. It is used to treat schizophrenia and bipolar disorder, also known as manic-depression. This medicine may be used for other purposes; ask your health care provider or pharmacist if you have questions. COMMON BRAND NAME(S): Seroquel What should I tell my health care provider before I take this medicine? They need to know if you have any of these conditions:  blockage in  your bowel  cataracts  constipation  dehydration  diabetes  difficulty swallowing  glaucoma  heart disease  history of breast cancer  kidney disease  liver disease  low blood counts, like low white cell, platelet, or red cell counts  low blood pressure or dizziness when standing up  Parkinson's disease  previous heart attack  prostate disease  seizures  stomach or intestine problems  suicidal thoughts, plans or attempt; a previous suicide attempt by you or a family member  thyroid disease  trouble passing urine  an unusual or allergic reaction to quetiapine, other medicines, foods, dyes, or preservatives  pregnant or trying to get pregnant  breast-feeding How should I use this medicine? Take this medicine by mouth. Swallow it with a drink of water. Follow the directions on the prescription label. If it upsets your stomach you can take it with food. Take your medicine at regular intervals. Do not take it more often than directed. Do not stop taking except on the advice of your doctor or health care professional. A special MedGuide will be given to you by the pharmacist with each prescription and refill. Be sure to read this information carefully each time. Talk to your pediatrician regarding the use of this medicine in children. While this drug may be prescribed for children as young as 10 years for selected conditions, precautions  do apply. Patients over age 2 years may have a stronger reaction to this medicine and need smaller doses. Overdosage: If you think you have taken too much of this medicine contact a poison control center or emergency room at once. NOTE: This medicine is only for you. Do not share this medicine with others. What if I miss a dose? If you miss a dose, take it as soon as you can. If it is almost time for your next dose, take only that dose. Do not take double or extra doses. What may interact with this medicine? Do not take this  medicine with any of the following medications:  cisapride  dronedarone  fluconazole  metoclopramide  pimozide  posaconazole  thioridazine This medicine may also interact with the following medications:  alcohol  antihistamines for allergy cough and cold  antiviral medicines for HIV or AIDS  atropine  certain medicines for bladder problems like oxybutynin, tolterodine  certain medicines for blood pressure  certain medicines for depression, anxiety, or psychotic disturbances  certain medicines for diabetes  certain medicines for stomach problems like dicyclomine, hyoscyamine  certain medicines for travel sickness like scopolamine  certain medicines for Parkinson's disease  certain medicines for seizures like carbamazepine, phenobarbital, phenytoin  cimetidine  erythromycin  ipratropium  other medicines that prolong the QT interval (cause an abnormal heart rhythm) like dofetilide  rifampin  steroid medicines like prednisone or cortisone This list may not describe all possible interactions. Give your health care provider a list of all the medicines, herbs, non-prescription drugs, or dietary supplements you use. Also tell them if you smoke, drink alcohol, or use illegal drugs. Some items may interact with your medicine. What should I watch for while using this medicine? Visit your doctor or health care professional for regular checks on your progress. It may be several weeks before you see the full effects of this medicine. Your health care provider may suggest that you have your eyes examined prior to starting this medicine, and every 6 months thereafter. If you have been taking this medicine regularly for some time, do not suddenly stop taking it. You must gradually reduce the dose or your symptoms may get worse. Ask your doctor or health care professional for advice. This medicine may increase blood sugar. Ask your healthcare provider if changes in diet or  medicines are needed if you have diabetes. Patients and their families should watch out for worsening depression or thoughts of suicide. Also watch out for sudden or severe changes in feelings such as feeling anxious, agitated, panicky, irritable, hostile, aggressive, impulsive, severely restless, overly excited and hyperactive, or not being able to sleep. If this happens, especially at the beginning of antidepressant treatment or after a change in dose, call your health care professional. Dennis Bast may get dizzy or drowsy. Do not drive, use machinery, or do anything that needs mental alertness until you know how this medicine affects you. Do not stand or sit up quickly, especially if you are an older patient. This reduces the risk of dizzy or fainting spells. Alcohol can increase dizziness and drowsiness. Avoid alcoholic drinks. Do not treat yourself for colds, diarrhea or allergies. Ask your doctor or health care professional for advice, some ingredients may increase possible side effects. This medicine can reduce the response of your body to heat or cold. Dress warm in cold weather and stay hydrated in hot weather. If possible, avoid extreme temperatures like saunas, hot tubs, very hot or cold showers, or activities that can  cause dehydration such as vigorous exercise. What side effects may I notice from receiving this medicine? Side effects that you should report to your doctor or health care professional as soon as possible:  allergic reactions like skin rash, itching or hives, swelling of the face, lips, or tongue  changes in vision  difficulty swallowing  elevated mood, decreased need for sleep, racing thoughts, impulsive behavior  eye pain  redness, blistering, peeling, or loosening of the skin, including inside the mouth  restlessness, pacing, inability to keep still  seizures  signs and symptoms of a dangerous change in heartbeat or heart rhythm like chest pain; dizziness; fast, irregular  heartbeat; palpitations; feeling faint or lightheaded; falls; breathing problems  signs and symptoms of high blood sugar such as being more thirsty or hungry or having to urinate more than normal. You may also feel very tired or have blurry vision.  signs and symptoms of hypothyroidism like fatigue; increased sensitivity to cold; weight gain; hoarseness; thinning hair  signs and symptoms of infection like fever; chills; cough; sore throat; pain or trouble passing urine  signs and symptoms of low blood pressure like dizziness; feeling faint or lightheaded; falls; unusually weak or tired  signs and symptoms of neuroleptic malignant syndrome (NMS) like confusion; fast, irregular heartbeat; high fever; increased sweating; stiff muscles  signs and symptoms of a stroke like changes in vision; confusion; trouble speaking or understanding; severe headaches; sudden numbness or weakness of the face, arm or leg; trouble walking; dizziness; loss of balance or coordination  signs and symptoms of tardive dyskinesia, like uncontrollable head, mouth, neck, arm, or leg movements  suicidal thoughts, mood changes Side effects that usually do not require medical attention (report to your doctor or health care professional if they continue or are bothersome):  change in sex drive or performance  constipation  drowsiness  dry mouth  upset stomach  weight gain This list may not describe all possible side effects. Call your doctor for medical advice about side effects. You may report side effects to FDA at 1-800-FDA-1088. Where should I keep my medicine? Keep out of the reach of children. Store at room temperature between 15 and 30 degrees C (59 and 86 degrees F). Throw away any unused medicine after the expiration date. NOTE: This sheet is a summary. It may not cover all possible information. If you have questions about this medicine, talk to your doctor, pharmacist, or health care provider.  2020  Elsevier/Gold Standard (2018-07-27 08:55:27)

## 2019-08-05 LAB — LIPID PANEL
Cholesterol: 163 mg/dL (ref <200)
HDL: 39 mg/dL — ABNORMAL LOW (ref >=50)
LDL Cholesterol (Calc): 77 mg/dL (calc)
Non-HDL Cholesterol (Calc): 124 mg/dL (calc) (ref <130)
Total CHOL/HDL Ratio: 4.2 (calc) (ref <5.0)
Triglycerides: 386 mg/dL — ABNORMAL HIGH (ref <150)

## 2019-08-05 LAB — COMPLETE METABOLIC PANEL WITH GFR
AG Ratio: 1.9 (calc) (ref 1.0–2.5)
ALT: 46 U/L — ABNORMAL HIGH (ref 6–29)
AST: 68 U/L — ABNORMAL HIGH (ref 10–35)
Albumin: 4.6 g/dL (ref 3.6–5.1)
Alkaline phosphatase (APISO): 79 U/L (ref 37–153)
BUN/Creatinine Ratio: 25 (calc) — ABNORMAL HIGH (ref 6–22)
BUN: 26 mg/dL — ABNORMAL HIGH (ref 7–25)
CO2: 26 mmol/L (ref 20–32)
Calcium: 9.9 mg/dL (ref 8.6–10.4)
Chloride: 103 mmol/L (ref 98–110)
Creat: 1.03 mg/dL — ABNORMAL HIGH (ref 0.50–0.99)
GFR, Est African American: 64 mL/min/{1.73_m2} (ref >=60)
GFR, Est Non African American: 55 mL/min/{1.73_m2} — ABNORMAL LOW (ref >=60)
Globulin: 2.4 g/dL (calc) (ref 1.9–3.7)
Glucose, Bld: 148 mg/dL — ABNORMAL HIGH (ref 65–99)
Potassium: 4.1 mmol/L (ref 3.5–5.3)
Sodium: 140 mmol/L (ref 135–146)
Total Bilirubin: 0.3 mg/dL (ref 0.2–1.2)
Total Protein: 7 g/dL (ref 6.1–8.1)

## 2019-08-05 LAB — CBC WITH DIFFERENTIAL/PLATELET
Absolute Monocytes: 589 cells/uL (ref 200–950)
Basophils Absolute: 50 cells/uL (ref 0–200)
Basophils Relative: 0.7 %
Eosinophils Absolute: 71 cells/uL (ref 15–500)
Eosinophils Relative: 1 %
HCT: 41.3 % (ref 35.0–45.0)
Hemoglobin: 13.5 g/dL (ref 11.7–15.5)
Lymphs Abs: 1626 cells/uL (ref 850–3900)
MCH: 28.7 pg (ref 27.0–33.0)
MCHC: 32.7 g/dL (ref 32.0–36.0)
MCV: 87.9 fL (ref 80.0–100.0)
MPV: 10.3 fL (ref 7.5–12.5)
Monocytes Relative: 8.3 %
Neutro Abs: 4764 cells/uL (ref 1500–7800)
Neutrophils Relative %: 67.1 %
Platelets: 354 10*3/uL (ref 140–400)
RBC: 4.7 10*6/uL (ref 3.80–5.10)
RDW: 12.1 % (ref 11.0–15.0)
Total Lymphocyte: 22.9 %
WBC: 7.1 10*3/uL (ref 3.8–10.8)

## 2019-08-05 LAB — HEMOGLOBIN A1C
Hgb A1c MFr Bld: 6.3 % of total Hgb — ABNORMAL HIGH (ref <5.7)
Mean Plasma Glucose: 134 (calc)
eAG (mmol/L): 7.4 (calc)

## 2019-08-05 LAB — TSH: TSH: 1.6 mIU/L (ref 0.40–4.50)

## 2019-08-05 LAB — MAGNESIUM: Magnesium: 2 mg/dL (ref 1.5–2.5)

## 2019-08-15 DIAGNOSIS — M25572 Pain in left ankle and joints of left foot: Secondary | ICD-10-CM | POA: Diagnosis not present

## 2019-08-17 DIAGNOSIS — M25572 Pain in left ankle and joints of left foot: Secondary | ICD-10-CM | POA: Diagnosis not present

## 2019-08-22 DIAGNOSIS — M25572 Pain in left ankle and joints of left foot: Secondary | ICD-10-CM | POA: Diagnosis not present

## 2019-08-23 DIAGNOSIS — Z8781 Personal history of (healed) traumatic fracture: Secondary | ICD-10-CM | POA: Diagnosis not present

## 2019-08-23 DIAGNOSIS — S82892D Other fracture of left lower leg, subsequent encounter for closed fracture with routine healing: Secondary | ICD-10-CM | POA: Diagnosis not present

## 2019-08-31 ENCOUNTER — Other Ambulatory Visit: Payer: Self-pay | Admitting: Adult Health

## 2019-08-31 NOTE — Progress Notes (Signed)
Assessment and Plan:  Recurrent Major depression, chronic ([place]) Much improved with most recent medication adjustments PHQ 21 > 11 Pending evaluation by psych; ? Benefit from low dose ritalin which she has been on in the past but will defer to them Denies further hallucinations since reduction of cymbalta and addition of low dose seroquel  Follow up in 2 months as scheduled or sooner if needed Denies HI/SI Lifestyle discussed: diet/exerise, sleep hygiene, stress management, hydration  Further disposition pending results of labs. Discussed med's effects and SE's.   Over 30 minutes of exam, counseling, chart review, and critical decision making was performed.   Future Appointments  Date Time Provider [place]  10/27/2019  9:30 AM Unk Pinto, MD [facility] None  04/11/2020 10:00 AM Liane Comber, NP [facility] None    ------------------------------------------------------------------------------------------------------------------   HPI 69 y.o.female presents for 4-6 week follow up for severe depression.   She has long hx of severe depression and is on lamictal, wellbutrin, cymbalta and remained poorly controlled; worse since retirement and with covid 19;   Last visit she reported some hallucinations in the evenings, thinking her sister was in the room with her or saw a mouth in her bed when she objectively knew that could not be.   It was felt cymbalta may be contributing, dose was reduced from 60 mg to 30 mg, continues with wellbutrin 300 mg daily, lamictal 100 mg daily (has been on this many years for severe depression, initiated remotely by psych). Seroquel 25 mg was also added for hallucinations and she was encouraged to schedule an appointment with psych.   She reports has been unable to get in touch with any psych to schedule but plans to continue trying.   She reports and is obviously apparent that she is much improved today. She reports about 2 weeks after our  previous visit and med adjustment she started feeling better, still having good days and bad days but not sleeping as much. She reports NO hallucinations since last visit, memory and focus also seems somewhat improved. She has also been using phototherapy lamp as well.    Past Medical History:  Diagnosis Date  . Anemia   . Depression   . Extrinsic asthma 11/09/2014  . GERD (gastroesophageal reflux disease)   . Hay fever   . Head trauma   . IBS (irritable bowel syndrome)   . Memory loss   . Migraines   . OSA on CPAP   . Reflux   . RLS (restless legs syndrome)   . Sinus congestion      Allergies  Allergen Reactions  . Erythromycin Other (See Comments)    TBS  . Iodinated Diagnostic Agents Other (See Comments)    Hot flashes, itching scalp Hot flashes, itching scalp  . Penicillins Hives  . Clarithromycin Other (See Comments)    Itching ankles  . Brintellix [Vortioxetine]     Itching    Current Outpatient Medications on File Prior to Visit  Medication Sig  . acetaminophen (TYLENOL) 650 MG CR tablet Take 650 mg by mouth every 8 (eight) hours as needed for pain.  Marland Kitchen buPROPion (WELLBUTRIN XL) 300 MG 24 hr tablet Take 1 tablet (300 mg total) by mouth daily.  . DULoxetine (CYMBALTA) 30 MG capsule Take 1 capsule (30 mg total) by mouth daily.  Marland Kitchen ezetimibe (ZETIA) 10 MG tablet Take 1 tablet (10 mg total) by mouth daily.  . famotidine (PEPCID) 20 MG tablet Take 1 tablet (20 mg total) by mouth 2 (  two) times daily as needed for heartburn or indigestion.  . fenofibrate (TRICOR) 145 MG tablet Take 1 tablet Daily for Blood Fats (Triglycerides) (Patient taking differently: Take 145 mg by mouth daily. )  . lamoTRIgine (LAMICTAL) 100 MG tablet Take 1 tablet (100 mg total) by mouth daily.  . ondansetron (ZOFRAN ODT) 8 MG disintegrating tablet Take 1 tablet (8 mg total) by mouth every 8 (eight) hours as needed for nausea or vomiting.  Marland Kitchen QUEtiapine (SEROQUEL) 25 MG tablet Take 1 tablet at Bedtime  as needed for Seep  . rOPINIRole (REQUIP) 1 MG tablet Take 1/2 to 1 or 2 tablets at night for Restless Legs (Patient taking differently: Take 0.5-2 mg by mouth at bedtime as needed (restless legs). )  . SUMAtriptan (IMITREX) 25 MG tablet Take 1-2 tab with onset of migraine. May repeat in 2 hours if headache persists. Max 4 tabs in 24 hours. (Patient taking differently: Take 25-50 mg by mouth See admin instructions. Take 1-2 tab with onset of migraine. May repeat in 2 hours if headache persists. Max 4 tabs in 24 hours.)   No current facility-administered medications on file prior to visit.    ROS: all negative except above.   Physical Exam:  BP 124/76   Pulse 87   Temp (!) 97.1 F (36.2 C)   Wt 190 lb (86.2 kg)   SpO2 97%   BMI 29.76 kg/m   General Appearance: Well nourished, in no apparent distress. Eyes: PERRLA, conjunctiva no swelling or erythema ENT/Mouth: Ext aud canals clear, TMs without erythema, bulging. Mask in place; oral exam deferred. Hearing normal.  Neck: Supple, thyroid normal.  Respiratory: Respiratory effort normal, BS equal bilaterally without rales, rhonchi, wheezing or stridor.  Cardio: RRR with no MRGs. Brisk peripheral pulses without edema.  Abdomen: Soft, + BS.  Non tender Lymphatics: Non tender without lymphadenopathy.  Musculoskeletal: No obvious deformity, normal gait.  Skin: Warm, dry without rashes, lesions, ecchymosis.  Neuro: Cranial nerves intact. Normal muscle tone, no cerebellar symptoms. Sensation intact.  Psych: Awake and oriented X 3, normal affect, smiling, speech not pressured. Insight and Judgment appropriate.     Izora Ribas, NP 3:57 PM [facility]

## 2019-09-01 ENCOUNTER — Ambulatory Visit (INDEPENDENT_AMBULATORY_CARE_PROVIDER_SITE_OTHER): Payer: Medicare HMO | Admitting: Adult Health

## 2019-09-01 ENCOUNTER — Other Ambulatory Visit: Payer: Self-pay

## 2019-09-01 ENCOUNTER — Other Ambulatory Visit: Payer: Self-pay | Admitting: Adult Health

## 2019-09-01 ENCOUNTER — Encounter: Payer: Self-pay | Admitting: Adult Health

## 2019-09-01 VITALS — BP 124/76 | HR 87 | Temp 97.1°F | Wt 190.0 lb

## 2019-09-01 DIAGNOSIS — F339 Major depressive disorder, recurrent, unspecified: Secondary | ICD-10-CM

## 2019-09-01 DIAGNOSIS — M25572 Pain in left ankle and joints of left foot: Secondary | ICD-10-CM | POA: Diagnosis not present

## 2019-09-01 NOTE — Patient Instructions (Signed)
      Individuals seeking information about the vaccines and state's phased distribution plan can learn more by going to - [email or web address]  [email or web address]   And continue to monitor the county's website and press releases.   And you can call the [facility] starting on Jan 8th at 8 AM to schedule for a vaccine. [phone].

## 2019-09-12 ENCOUNTER — Ambulatory Visit: Payer: Medicare HMO | Attending: Internal Medicine

## 2019-09-12 DIAGNOSIS — M25572 Pain in left ankle and joints of left foot: Secondary | ICD-10-CM | POA: Diagnosis not present

## 2019-09-12 DIAGNOSIS — Z23 Encounter for immunization: Secondary | ICD-10-CM | POA: Insufficient documentation

## 2019-09-12 NOTE — Progress Notes (Signed)
   Covid-19 Vaccination Clinic  Name:  Kimberly Ray    MRN: [id number] DOB: August 07, 1951  09/12/2019  Kimberly Ray was observed post Covid-19 immunization for 15 minutes without incidence. She was provided with Vaccine Information Sheet and instruction to access the V-Safe system.   Kimberly Ray was instructed to call 911 with any severe reactions post vaccine: Marland Kitchen Difficulty breathing  . Swelling of your face and throat  . A fast heartbeat  . A bad rash all over your body  . Dizziness and weakness    Immunizations Administered    Name Date Dose VIS Date Route   Pfizer COVID-19 Vaccine 09/12/2019  5:22 PM 0.3 mL 07/29/2019 Intramuscular   Manufacturer: [employer]   Lot: [id number]   [place]: [id number]

## 2019-09-15 ENCOUNTER — Other Ambulatory Visit: Payer: Self-pay | Admitting: Adult Health

## 2019-09-15 MED ORDER — EZETIMIBE 10 MG PO TABS: 10.0000 mg | ORAL_TABLET | Freq: Every day | ORAL | 1 refills | Status: DC

## 2019-09-15 MED ORDER — QUETIAPINE FUMARATE 50 MG PO TABS: ORAL_TABLET | ORAL | 1 refills | Status: DC

## 2019-09-28 ENCOUNTER — Other Ambulatory Visit: Payer: Self-pay | Admitting: Adult Health

## 2019-10-03 ENCOUNTER — Ambulatory Visit: Payer: Medicare HMO | Attending: Internal Medicine

## 2019-10-03 DIAGNOSIS — Z23 Encounter for immunization: Secondary | ICD-10-CM | POA: Insufficient documentation

## 2019-10-03 NOTE — Progress Notes (Signed)
   Covid-19 Vaccination Clinic  Name:  MARIANNE PEITZ    MRN: [id number] DOB: [birth year]  10/03/2019  Ms. Feng was observed post Covid-19 immunization for 15 minutes without incidence. She was provided with Vaccine Information Sheet and instruction to access the V-Safe system.   Ms. Wasielewski was instructed to call 911 with any severe reactions post vaccine: Marland Kitchen Difficulty breathing  . Swelling of your face and throat  . A fast heartbeat  . A bad rash all over your body  . Dizziness and weakness    Immunizations Administered    Name Date Dose VIS Date Route   Pfizer COVID-19 Vaccine 10/03/2019 12:50 PM 0.3 mL 07/29/2019 Intramuscular   Manufacturer: [place]   Lot: [id number]   [place]: [id number]

## 2019-10-12 ENCOUNTER — Other Ambulatory Visit: Payer: Self-pay | Admitting: Adult Health

## 2019-10-12 ENCOUNTER — Other Ambulatory Visit: Payer: Self-pay | Admitting: Internal Medicine

## 2019-10-12 DIAGNOSIS — G2581 Restless legs syndrome: Secondary | ICD-10-CM

## 2019-10-26 ENCOUNTER — Encounter: Payer: Self-pay | Admitting: Internal Medicine

## 2019-10-26 NOTE — Progress Notes (Addendum)
History of Present Illness:       This very nice 69 y.o. DWF presents for 6 month follow up with Labile HTN, HLD, Bipolar Depression, Pre-Diabetes and Vitamin D Deficiency.        Patient has ongoing difficulties with her bipolar depression and chronic Insomnia and now also reports Having "memory issues" with Shot term recall difficulties. She also alludes concerns  to having delusions or hallucinations of communicating with people she perceives are around, but she knows in reality that they are not.  She requests referral to be evaluated by Dr Krista Blue.       Patient is monitored expectantly for labile elevated BP's & BP has been controlled at home. Today's BP is at goal - 136/86. Patient has had no complaints of any cardiac type chest pain, palpitations, dyspnea / orthopnea / PND, dizziness, claudication, or dependent edema.      Hyperlipidemia is controlled with diet & meds. Patient denies myalgias or other med SE's. Last Lipids were at goal:  Lab Results  Component Value Date   CHOL 163 08/04/2019   HDL 39 (L) 08/04/2019   LDLCALC 77 08/04/2019   TRIG 386 (H) 08/04/2019   CHOLHDL 4.2 08/04/2019    Also, the patient has history of PreDiabetes (A1c 6.3% / 2011) and has had no symptoms of reactive hypoglycemia, diabetic polys, paresthesias or visual blurring.  Last A1c was not at goal:  Lab Results  Component Value Date   HGBA1C 6.3 (H) 08/04/2019       Further, the patient also has history of Vitamin D Deficiency ("33" / 2012) and supplements vitamin D without any suspected side-effects. Last vitamin D was at goal:  Lab Results  Component Value Date   VD25OH 79 04/12/2019    Current Outpatient Medications on File Prior to Visit  Medication Sig  . acetaminophen (TYLENOL) 650 MG CR tablet Take 650 mg by mouth every 8 (eight) hours as needed for pain.  Marland Kitchen buPROPion (WELLBUTRIN XL) 300 MG 24 hr tablet Take 1 tablet Daily for Mood  . DULoxetine (CYMBALTA) 30 MG capsule TAKE ONE  CAPSULE BY MOUTH DAILY  . ezetimibe (ZETIA) 10 MG tablet Take 1 tablet (10 mg total) by mouth daily.  . famotidine (PEPCID) 20 MG tablet Take 1 tablet 2 x /day for Heartburn & Indigestion  . fenofibrate (TRICOR) 145 MG tablet Take 1 tablet Daily for Blood Fats (Triglycerides) (Patient taking differently: Take 145 mg by mouth daily. )  . lamoTRIgine (LAMICTAL) 100 MG tablet Take 1 tablet (100 mg total) by mouth daily.  . ondansetron (ZOFRAN ODT) 8 MG disintegrating tablet Take 1 tablet (8 mg total) by mouth every 8 (eight) hours as needed for nausea or vomiting.  Marland Kitchen QUEtiapine (SEROQUEL) 50 MG tablet Take 1 tablet at Bedtime as needed for Seep  . rOPINIRole (REQUIP) 1 MG tablet Take 3 tablets at Bedtime for "Restless  Legs"  . SUMAtriptan (IMITREX) 25 MG tablet Take 1-2 tab with onset of migraine. May repeat in 2 hours if headache persists. Max 4 tabs in 24 hours. (Patient taking differently: Take 25-50 mg by mouth See admin instructions. Take 1-2 tab with onset of migraine. May repeat in 2 hours if headache persists. Max 4 tabs in 24 hours.)   No current facility-administered medications on file prior to visit.    Allergies  Allergen Reactions  . Erythromycin Other (See Comments)    TBS  . Iodinated Diagnostic Agents Other (See Comments)  Hot flashes, itching scalp Hot flashes, itching scalp  . Penicillins Hives  . Clarithromycin Other (See Comments)    Itching ankles  . Brintellix [Vortioxetine]     Itching    PMHx:   Past Medical History:  Diagnosis Date  . Anemia   . Depression   . Extrinsic asthma 11/09/2014  . GERD (gastroesophageal reflux disease)   . Hay fever   . Head trauma   . IBS (irritable bowel syndrome)   . Memory loss   . Migraines   . OSA on CPAP   . Reflux   . RLS (restless legs syndrome)   . Sinus congestion     Immunization History  Administered Date(s) Administered  . Influenza, High Dose Seasonal PF 06/12/2016, 05/13/2017  . Influenza-Unspecified  06/02/2015, 05/19/2018, 05/19/2019  . PFIZER SARS-COV-2 Vaccination 09/12/2019, 10/03/2019  . PPD Test 08/19/2011, 08/30/2013, 11/09/2014  . Pneumococcal Conjugate-13 08/18/2009, 02/18/2019  . Pneumococcal Polysaccharide-23 06/12/2016  . Td 08/19/2003  . Tdap 08/30/2013  . Zoster 08/18/2010    Past Surgical History:  Procedure Laterality Date  . BREAST BIOPSY Right 02/27/2014   Stereo- Benign  . CHOLECYSTECTOMY  11/03/2017   Dr. Rosendo Gros  . LAPAROSCOPIC HYSTERECTOMY  12/2017  . NASAL SINUS SURGERY    . ORIF ANKLE FRACTURE BIMALLEOLAR Left 03/14/2019   [facility], Dr. Sheran Lawless  . perforated eardrum     left side  . TONSILLECTOMY AND ADENOIDECTOMY    . URETEROSCOPY     for ureteral stone    FHx:    Reviewed / unchanged  SHx:    Reviewed / unchanged   Systems Review:  Constitutional: Denies fever, chills, wt changes, headaches, insomnia, fatigue, night sweats, change in appetite. Eyes: Denies redness, blurred vision, diplopia, discharge, itchy, watery eyes.  ENT: Denies discharge, congestion, post nasal drip, epistaxis, sore throat, earache, hearing loss, dental pain, tinnitus, vertigo, sinus pain, snoring.  CV: Denies chest pain, palpitations, irregular heartbeat, syncope, dyspnea, diaphoresis, orthopnea, PND, claudication or edema. Respiratory: denies cough, dyspnea, DOE, pleurisy, hoarseness, laryngitis, wheezing.  Gastrointestinal: Denies dysphagia, odynophagia, heartburn, reflux, water brash, abdominal pain or cramps, nausea, vomiting, bloating, diarrhea, constipation, hematemesis, melena, hematochezia  or hemorrhoids. Genitourinary: Denies dysuria, frequency, urgency, nocturia, hesitancy, discharge, hematuria or flank pain. Musculoskeletal: Denies arthralgias, myalgias, stiffness, jt. swelling, pain, limping or strain/sprain.  Skin: Denies pruritus, rash, hives, warts, acne, eczema or change in skin lesion(s). Neuro: No weakness, tremor, incoordination, spasms, paresthesia or  pain. Psychiatric: Denies confusion, memory loss or sensory loss. Endo: Denies change in weight, skin or hair change.  Heme/Lymph: No excessive bleeding, bruising or enlarged lymph nodes.  Physical Exam  BP 136/86   Pulse 84   Temp 97.6 F (36.4 C)   Resp 16   Ht 5\' 7"  (1.702 m)   Wt 191 lb 6.4 oz (86.8 kg)   BMI 29.98 kg/m   Appears  well nourished, well groomed  and in no distress.  Eyes: PERRLA, EOMs, conjunctiva no swelling or erythema. Sinuses: No frontal/maxillary tenderness ENT/Mouth: EAC's clear, TM's nl w/o erythema, bulging. Nares clear w/o erythema, swelling, exudates. Oropharynx clear without erythema or exudates. Oral hygiene is good. Tongue normal, non obstructing. Hearing intact.  Neck: Supple. Thyroid not palpable. Car 2+/2+ without bruits, nodes or JVD. Chest: Respirations nl with BS clear & equal w/o rales, rhonchi, wheezing or stridor.  Cor: Heart sounds normal w/ regular rate and rhythm without sig. murmurs, gallops, clicks or rubs. Peripheral pulses normal and equal  without edema.  Abdomen: Soft &  bowel sounds normal. Non-tender w/o guarding, rebound, hernias, masses or organomegaly.  Lymphatics: Unremarkable.  Musculoskeletal: Full ROM all peripheral extremities, joint stability, 5/5 strength and normal gait.  Skin: Warm, dry without exposed rashes, lesions or ecchymosis apparent.  Neuro: Cranial nerves intact, reflexes equal bilaterally. Sensory-motor testing grossly intact. Tendon reflexes grossly intact.  Pysch: Alert & oriented x 3.  Insight and judgement nl & appropriate. No ideations.  Assessment and Plan:  1. Labile hypertension  - Continue medication, monitor blood pressure at home.  - Continue DASH diet.  Reminder to go to the ER if any CP,  SOB, nausea, dizziness, severe HA, changes vision/speech.  - CBC with Differential/Platelet - COMPLETE METABOLIC PANEL WITH GFR - Magnesium - TSH  2. Hyperlipidemia, mixed  - Continue diet/meds,  exercise,& lifestyle modifications.  - Continue monitor periodic cholesterol/liver & renal functions   - Lipid panel - TSH  3. Abnormal glucose  - Continue diet, exercise  - Lifestyle modifications.  - Monitor appropriate labs.  - Hemoglobin A1c - Insulin, random  4. Vitamin D deficiency  - Continue supplementation.  - VITAMIN D 25 Hydroxy  5. Medication management  - CBC with Differential/Platelet - COMPLETE METABOLIC PANEL WITH GFR - Magnesium - Lipid panel - TSH - Hemoglobin A1c - Insulin, random - VITAMIN D 25 Hydroxy        Referral is made for Neurology evaluation to evaluate Hallucinations that may in fact be consequent side-effects  the meds that she 's taking.         Discussed  regular exercise, BP monitoring, weight control to achieve/maintain BMI less than 25 and discussed med and SE's. Recommended labs to assess and monitor clinical status with further disposition pending results of labs.  I discussed the assessment and treatment plan with the patient. The patient was provided an opportunity to ask questions and all were answered. The patient agreed with the plan and demonstrated an understanding of the instructions.  I provided over 30 minutes of exam, counseling, chart review and  complex critical decision making.    Kirtland Bouchard, MD

## 2019-10-26 NOTE — Patient Instructions (Signed)

## 2019-10-27 ENCOUNTER — Ambulatory Visit (INDEPENDENT_AMBULATORY_CARE_PROVIDER_SITE_OTHER): Payer: Medicare HMO | Admitting: Internal Medicine

## 2019-10-27 ENCOUNTER — Other Ambulatory Visit: Payer: Self-pay

## 2019-10-27 ENCOUNTER — Ambulatory Visit: Payer: Medicare HMO | Admitting: Internal Medicine

## 2019-10-27 ENCOUNTER — Encounter: Payer: Self-pay | Admitting: Internal Medicine

## 2019-10-27 VITALS — BP 136/86 | HR 84 | Temp 97.6°F | Resp 16 | Ht 67.0 in | Wt 191.4 lb

## 2019-10-27 DIAGNOSIS — R443 Hallucinations, unspecified: Secondary | ICD-10-CM | POA: Diagnosis not present

## 2019-10-27 DIAGNOSIS — Z79899 Other long term (current) drug therapy: Secondary | ICD-10-CM

## 2019-10-27 DIAGNOSIS — E782 Mixed hyperlipidemia: Secondary | ICD-10-CM

## 2019-10-27 DIAGNOSIS — E559 Vitamin D deficiency, unspecified: Secondary | ICD-10-CM

## 2019-10-27 DIAGNOSIS — R7309 Other abnormal glucose: Secondary | ICD-10-CM | POA: Diagnosis not present

## 2019-10-27 DIAGNOSIS — R0989 Other specified symptoms and signs involving the circulatory and respiratory systems: Secondary | ICD-10-CM

## 2019-10-28 LAB — COMPLETE METABOLIC PANEL WITH GFR
AG Ratio: 2.2 (calc) (ref 1.0–2.5)
ALT: 57 U/L — ABNORMAL HIGH (ref 6–29)
AST: 111 U/L — ABNORMAL HIGH (ref 10–35)
Albumin: 4.7 g/dL (ref 3.6–5.1)
Alkaline phosphatase (APISO): 73 U/L (ref 37–153)
BUN: 17 mg/dL (ref 7–25)
CO2: 26 mmol/L (ref 20–32)
Calcium: 10.2 mg/dL (ref 8.6–10.4)
Chloride: 103 mmol/L (ref 98–110)
Creat: 0.91 mg/dL (ref 0.50–0.99)
GFR, Est African American: 75 mL/min/{1.73_m2} (ref >=60)
GFR, Est Non African American: 64 mL/min/{1.73_m2} (ref >=60)
Globulin: 2.1 g/dL (calc) (ref 1.9–3.7)
Glucose, Bld: 133 mg/dL — ABNORMAL HIGH (ref 65–99)
Potassium: 4.3 mmol/L (ref 3.5–5.3)
Sodium: 139 mmol/L (ref 135–146)
Total Bilirubin: 0.4 mg/dL (ref 0.2–1.2)
Total Protein: 6.8 g/dL (ref 6.1–8.1)

## 2019-10-28 LAB — LIPID PANEL
Cholesterol: 190 mg/dL (ref <200)
HDL: 43 mg/dL — ABNORMAL LOW (ref >=50)
LDL Cholesterol (Calc): 104 mg/dL (calc) — ABNORMAL HIGH
Non-HDL Cholesterol (Calc): 147 mg/dL (calc) — ABNORMAL HIGH (ref <130)
Total CHOL/HDL Ratio: 4.4 (calc) (ref <5.0)
Triglycerides: 321 mg/dL — ABNORMAL HIGH (ref <150)

## 2019-10-28 LAB — CBC WITH DIFFERENTIAL/PLATELET
Absolute Monocytes: 520 cells/uL (ref 200–950)
Basophils Absolute: 40 cells/uL (ref 0–200)
Basophils Relative: 0.8 %
Eosinophils Absolute: 60 cells/uL (ref 15–500)
Eosinophils Relative: 1.2 %
HCT: 42 % (ref 35.0–45.0)
Hemoglobin: 13.2 g/dL (ref 11.7–15.5)
Lymphs Abs: 1230 cells/uL (ref 850–3900)
MCH: 27.6 pg (ref 27.0–33.0)
MCHC: 31.4 g/dL — ABNORMAL LOW (ref 32.0–36.0)
MCV: 87.9 fL (ref 80.0–100.0)
MPV: 10.1 fL (ref 7.5–12.5)
Monocytes Relative: 10.4 %
Neutro Abs: 3150 cells/uL (ref 1500–7800)
Neutrophils Relative %: 63 %
Platelets: 317 10*3/uL (ref 140–400)
RBC: 4.78 10*6/uL (ref 3.80–5.10)
RDW: 12.8 % (ref 11.0–15.0)
Total Lymphocyte: 24.6 %
WBC: 5 10*3/uL (ref 3.8–10.8)

## 2019-10-28 LAB — VITAMIN D 25 HYDROXY (VIT D DEFICIENCY, FRACTURES): Vit D, 25-Hydroxy: 40 ng/mL (ref 30–100)

## 2019-10-28 LAB — HEMOGLOBIN A1C
Hgb A1c MFr Bld: 6.6 % of total Hgb — ABNORMAL HIGH (ref <5.7)
Mean Plasma Glucose: 143 (calc)
eAG (mmol/L): 7.9 (calc)

## 2019-10-28 LAB — INSULIN, RANDOM: Insulin: 164.7 u[IU]/mL — ABNORMAL HIGH

## 2019-10-28 LAB — MAGNESIUM: Magnesium: 1.9 mg/dL (ref 1.5–2.5)

## 2019-10-28 LAB — TSH: TSH: 3.06 mIU/L (ref 0.40–4.50)

## 2019-10-30 ENCOUNTER — Encounter: Payer: Self-pay | Admitting: Internal Medicine

## 2019-10-30 NOTE — Addendum Note (Signed)
Addended by: Unk Pinto on: 10/30/2019 12:13 AM   Modules accepted: Orders

## 2019-10-31 ENCOUNTER — Other Ambulatory Visit: Payer: Self-pay | Admitting: Internal Medicine

## 2019-10-31 DIAGNOSIS — F339 Major depressive disorder, recurrent, unspecified: Secondary | ICD-10-CM

## 2019-10-31 DIAGNOSIS — G2581 Restless legs syndrome: Secondary | ICD-10-CM

## 2019-10-31 DIAGNOSIS — F5101 Primary insomnia: Secondary | ICD-10-CM

## 2019-10-31 MED ORDER — ROPINIROLE HCL 5 MG PO TABS: ORAL_TABLET | ORAL | 3 refills | Status: DC

## 2019-10-31 MED ORDER — QUETIAPINE FUMARATE 50 MG PO TABS: ORAL_TABLET | ORAL | 1 refills | Status: DC

## 2019-11-02 ENCOUNTER — Telehealth: Payer: Self-pay | Admitting: Neurology

## 2019-11-02 NOTE — Telephone Encounter (Signed)
Ok to see Dr. Jannifer Franklin

## 2019-11-02 NOTE — Telephone Encounter (Signed)
Ok with me 

## 2019-11-02 NOTE — Telephone Encounter (Signed)
Patient has been referred back to [name] for a new issue.She's having memory problems and hallucinations. She has seen Dr. Krista Blue in the past, as well as Dr. Brett Fairy (for sleep), but is requesting to switch over to Dr. Jannifer Franklin. Would you both be ok with this?

## 2019-12-26 ENCOUNTER — Ambulatory Visit: Payer: Medicare HMO | Admitting: Neurology

## 2019-12-26 ENCOUNTER — Other Ambulatory Visit: Payer: Self-pay

## 2019-12-26 ENCOUNTER — Encounter: Payer: Self-pay | Admitting: Neurology

## 2019-12-26 VITALS — BP 122/75 | HR 86 | Temp 97.0°F | Ht 67.0 in | Wt 190.0 lb

## 2019-12-26 DIAGNOSIS — R269 Unspecified abnormalities of gait and mobility: Secondary | ICD-10-CM | POA: Diagnosis not present

## 2019-12-26 DIAGNOSIS — E538 Deficiency of other specified B group vitamins: Secondary | ICD-10-CM | POA: Diagnosis not present

## 2019-12-26 DIAGNOSIS — G2581 Restless legs syndrome: Secondary | ICD-10-CM | POA: Diagnosis not present

## 2019-12-26 DIAGNOSIS — R413 Other amnesia: Secondary | ICD-10-CM

## 2019-12-26 HISTORY — DX: Unspecified abnormalities of gait and mobility: R26.9

## 2019-12-26 NOTE — Progress Notes (Signed)
Reason for visit: Memory disturbance, visual and olfactory hallucinations  Referring physician: Dr. Alger Memos is a 69 y.o. female  History of present illness:  Ms. Heitner is a 69 year old right-handed white female with a history of diabetes and restless leg syndrome.  Restless leg syndrome runs in her family, her father and her brother have/had similar issues.  Over the last 5 or 6 years, she has noted some mild changes in memory.  She has had occasional events which she cannot figure out how things work, she may have some difficulty with remembering recent events or remembering names for people.  She over the last year has had occasional events where she may smell of tobacco smoke when there is none.  She has had events where she may see a vague object in her right peripheral vision but when she looks she sees nothing.  She sometimes thinks it is a mouse or a person, but she never actually sees the animal or person, she just has a sensation that that is what it is.  The patient is on Requip taking 5 mg at night, she has not had any recent increase in her dosing.  She has been placed on Seroquel.  The patient has also noted some change in balance, she will stumble on occasion, she fell last summer and fractured the left ankle.  She denies any numbness in her feet associated with her diabetes.  She has occasional headaches, she denies significant issues controlling the bowels or the bladder, she occasionally will have stress incontinence of the bladder.  She claims that her father also had memory problems before he died.  Occasionally when she stands up she may have a bit of a tremor involving the legs, occasionally a jaw tremor or tremor involving the left shoulder.  Currently, the patient is able to keep up with her medications and appointments but she is now using a pill dispenser.  She is able to operate a motor vehicle, she denies any issues getting lost.  She is sent to this office  for further evaluation.  Past Medical History:  Diagnosis Date  . Anemia   . Depression   . Extrinsic asthma 11/09/2014  . GERD (gastroesophageal reflux disease)   . Hay fever   . Head trauma   . IBS (irritable bowel syndrome)   . Memory loss   . Migraines   . OSA on CPAP   . Reflux   . RLS (restless legs syndrome)   . Sinus congestion     Past Surgical History:  Procedure Laterality Date  . BREAST BIOPSY Right 02/27/2014   Stereo- Benign  . CHOLECYSTECTOMY  11/03/2017   Dr. Rosendo Gros  . LAPAROSCOPIC HYSTERECTOMY  12/2017  . NASAL SINUS SURGERY    . ORIF ANKLE FRACTURE BIMALLEOLAR Left 03/14/2019   [facility], Dr. Sheran Lawless  . perforated eardrum     left side  . TONSILLECTOMY AND ADENOIDECTOMY    . URETEROSCOPY     for ureteral stone    Family History  Problem Relation Age of Onset  . Diabetes Mother   . Heart failure Father   . Diabetes Sister   . Breast cancer Maternal Grandmother 28  . Breast cancer Maternal Aunt 72  . Breast cancer Cousin   . Osteoporosis Brother   . Heart failure Maternal Grandfather 80  . Colon cancer Paternal Grandmother 63  . Stomach cancer Maternal Aunt     Social history:  reports that she has  never smoked. She has never used smokeless tobacco. She reports current alcohol use of about 1.0 standard drinks of alcohol per week. She reports that she does not use drugs.  Medications:  Prior to Admission medications   Medication Sig Start Date End Date Taking? Authorizing Provider  acetaminophen (TYLENOL) 650 MG CR tablet Take 650 mg by mouth every 8 (eight) hours as needed for pain.   Yes [provider]  buPROPion (WELLBUTRIN XL) 300 MG 24 hr tablet Take 1 tablet Daily for Mood 10/12/19  Yes Unk Pinto, MD  DULoxetine (CYMBALTA) 30 MG capsule TAKE ONE CAPSULE BY MOUTH DAILY 09/28/19  Yes Liane Comber, NP  ezetimibe (ZETIA) 10 MG tablet Take 1 tablet (10 mg total) by mouth daily. 09/15/19  Yes Liane Comber, NP  famotidine  (PEPCID) 20 MG tablet Take 1 tablet 2 x /day for Heartburn & Indigestion 10/12/19  Yes Unk Pinto, MD  fenofibrate (TRICOR) 145 MG tablet Take 1 tablet Daily for Blood Fats (Triglycerides) Patient taking differently: Take 145 mg by mouth daily.  02/16/19  Yes Unk Pinto, MD  lamoTRIgine (LAMICTAL) 100 MG tablet Take 1 tablet (100 mg total) by mouth daily. 12/02/18  Yes Liane Comber, NP  ondansetron (ZOFRAN ODT) 8 MG disintegrating tablet Take 1 tablet (8 mg total) by mouth every 8 (eight) hours as needed for nausea or vomiting. 08/04/19  Yes Liane Comber, NP  QUEtiapine (SEROQUEL) 50 MG tablet Take 1 tablet at Bedtime as needed for Seep 10/31/19  Yes Unk Pinto, MD  rOPINIRole (REQUIP) 5 MG tablet Take1 tablets at Bedtime for "Restless  Legs" 10/31/19  Yes Unk Pinto, MD  SUMAtriptan (IMITREX) 25 MG tablet Take 1-2 tab with onset of migraine. May repeat in 2 hours if headache persists. Max 4 tabs in 24 hours. Patient taking differently: Take 25-50 mg by mouth See admin instructions. Take 1-2 tab with onset of migraine. May repeat in 2 hours if headache persists. Max 4 tabs in 24 hours. 12/02/18  Yes Liane Comber, NP      Allergies  Allergen Reactions  . Erythromycin Other (See Comments)    TBS  . Iodinated Diagnostic Agents Other (See Comments)    Hot flashes, itching scalp Hot flashes, itching scalp  . Penicillins Hives  . Clarithromycin Other (See Comments)    Itching ankles  . Brintellix [Vortioxetine]     Itching    ROS:  Out of a complete 14 system review of symptoms, the patient complains only of the following symptoms, and all other reviewed systems are negative.  Restless legs Memory problems Hallucinations Gait instability  Blood pressure 122/75, pulse 86, temperature (!) 97 F (36.1 C), height 5\' 7"  (1.702 m), weight 190 lb (86.2 kg).  Physical Exam  General: The patient is alert and cooperative at the time of the examination.  Eyes:  Pupils are equal, round, and reactive to light. Discs are flat bilaterally.  Neck: The neck is supple, no carotid bruits are noted.  Respiratory: The respiratory examination is clear.  Cardiovascular: The cardiovascular examination reveals a regular rate and rhythm, no obvious murmurs or rubs are noted.  Skin: Extremities are without significant edema.  Neurologic Exam  Mental status: The patient is alert and oriented x 3 at the time of the examination. The Mini-Mental status examination done today shows a total score of 27/30.  The patient is able to name 11 four-legged animals in 60 seconds.  Cranial nerves: Facial symmetry is present. There is good sensation of the  face to pinprick and soft touch bilaterally. The strength of the facial muscles and the muscles to head turning and shoulder shrug are normal bilaterally. Speech is well enunciated, no aphasia or dysarthria is noted. Extraocular movements are full. Visual fields are full. The tongue is midline, and the patient has symmetric elevation of the soft palate. No obvious hearing deficits are noted.  Motor: The motor testing reveals 5 over 5 strength of all 4 extremities. Good symmetric motor tone is noted throughout.  Sensory: Sensory testing is intact to pinprick, soft touch, vibration sensation, and position sense on all 4 extremities. No evidence of extinction is noted.  Coordination: Cerebellar testing reveals good finger-nose-finger and heel-to-shin bilaterally.  Gait and station: Gait is normal. Tandem gait is unsteady. Romberg is negative. No drift is seen.  Reflexes: Deep tendon reflexes are symmetric, but are slightly depressed bilaterally. Toes are downgoing bilaterally.   Assessment/Plan:  1.  Mild memory disturbance  2.  Restless leg syndrome  3.  Visual/olfactory hallucinations  4.  Gait disturbance  The patient is on Requip which may potentially be the source of some of her visual events.  This usually does  not cause olfactory hallucinations however.  The patient does have some gait instability, no evidence of a significant peripheral neuropathy on clinical examination.  The patient will be set up for blood work today, she will have MRI of the brain.  We may consider cutting back slightly on the Requip in the future and adding gabapentin for the restless legs.  She will follow-up here otherwise in 6 months.  Jill Alexanders MD 12/26/2019 11:55 AM  [facility] [address] [place], [place] [address]  Phone [phone] Fax [phone]

## 2019-12-27 LAB — COMPREHENSIVE METABOLIC PANEL
ALT: 41 IU/L — ABNORMAL HIGH (ref 0–32)
AST: 58 IU/L — ABNORMAL HIGH (ref 0–40)
Albumin/Globulin Ratio: 2.4 — ABNORMAL HIGH (ref 1.2–2.2)
Albumin: 4.8 g/dL (ref 3.8–4.8)
Alkaline Phosphatase: 90 IU/L (ref 39–117)
BUN/Creatinine Ratio: 15 (ref 12–28)
BUN: 16 mg/dL (ref 8–27)
Bilirubin Total: 0.2 mg/dL (ref 0.0–1.2)
CO2: 23 mmol/L (ref 20–29)
Calcium: 10.5 mg/dL — ABNORMAL HIGH (ref 8.7–10.3)
Chloride: 105 mmol/L (ref 96–106)
Creatinine, Ser: 1.1 mg/dL — ABNORMAL HIGH (ref 0.57–1.00)
GFR calc Af Amer: 59 mL/min/{1.73_m2} — ABNORMAL LOW (ref >59)
GFR calc non Af Amer: 51 mL/min/{1.73_m2} — ABNORMAL LOW (ref >59)
Globulin, Total: 2 g/dL (ref 1.5–4.5)
Glucose: 75 mg/dL (ref 65–99)
Potassium: 4.7 mmol/L (ref 3.5–5.2)
Sodium: 141 mmol/L (ref 134–144)
Total Protein: 6.8 g/dL (ref 6.0–8.5)

## 2019-12-27 LAB — VITAMIN B12: Vitamin B-12: 449 pg/mL (ref 232–1245)

## 2019-12-27 LAB — RPR: RPR Ser Ql: NONREACTIVE

## 2019-12-27 LAB — SEDIMENTATION RATE: Sed Rate: 9 mm/hr (ref 0–40)

## 2019-12-28 ENCOUNTER — Encounter (INDEPENDENT_AMBULATORY_CARE_PROVIDER_SITE_OTHER): Payer: Self-pay

## 2019-12-29 ENCOUNTER — Telehealth: Payer: Self-pay | Admitting: Neurology

## 2019-12-29 NOTE — Telephone Encounter (Signed)
[employer] pending uploaded notes  

## 2019-12-29 NOTE — Telephone Encounter (Signed)
Kimberly Ray Kimberly Ray: [id number] (exp. 12/29/19 to 01/28/20) order sent to GI. They will reach out to the patient to schedule.

## 2019-12-30 ENCOUNTER — Other Ambulatory Visit: Payer: Self-pay | Admitting: Physician Assistant

## 2019-12-30 DIAGNOSIS — Z1231 Encounter for screening mammogram for malignant neoplasm of breast: Secondary | ICD-10-CM

## 2020-01-02 ENCOUNTER — Ambulatory Visit
Admission: RE | Admit: 2020-01-02 | Discharge: 2020-01-02 | Disposition: A | Discharge status: Home / Self Care | Payer: Medicare HMO | Source: Ambulatory Visit | Attending: Physician Assistant | Admitting: Physician Assistant

## 2020-01-02 ENCOUNTER — Other Ambulatory Visit: Payer: Self-pay

## 2020-01-02 DIAGNOSIS — Z1231 Encounter for screening mammogram for malignant neoplasm of breast: Secondary | ICD-10-CM

## 2020-01-03 ENCOUNTER — Other Ambulatory Visit: Payer: Self-pay

## 2020-01-03 ENCOUNTER — Encounter: Payer: Self-pay | Admitting: Adult Health

## 2020-01-03 ENCOUNTER — Ambulatory Visit (INDEPENDENT_AMBULATORY_CARE_PROVIDER_SITE_OTHER): Payer: Medicare HMO | Admitting: Adult Health

## 2020-01-03 ENCOUNTER — Other Ambulatory Visit: Payer: Self-pay | Admitting: Internal Medicine

## 2020-01-03 VITALS — BP 126/82 | HR 88 | Temp 97.3°F | Wt 188.0 lb

## 2020-01-03 DIAGNOSIS — R928 Other abnormal and inconclusive findings on diagnostic imaging of breast: Secondary | ICD-10-CM

## 2020-01-03 DIAGNOSIS — G4761 Periodic limb movement disorder: Secondary | ICD-10-CM | POA: Diagnosis not present

## 2020-01-03 DIAGNOSIS — F339 Major depressive disorder, recurrent, unspecified: Secondary | ICD-10-CM

## 2020-01-03 DIAGNOSIS — R443 Hallucinations, unspecified: Secondary | ICD-10-CM

## 2020-01-03 DIAGNOSIS — R413 Other amnesia: Secondary | ICD-10-CM

## 2020-01-03 NOTE — Progress Notes (Signed)
Assessment and Plan:  Kimberly Ray was seen today for medication management.  Diagnoses and all orders for this visit:  Major depression, recurrent, chronic ([place]) Once again repeated my concern with her severe/debilitating depression, I feel she would be best served with specialist input at this point due to polypharmacy and concern for med interactions/SE She is very open to this, admits she forgot this was recommended at last visit Actively in work up by neuro due to memory fog/hallucinations (? Structural vs med SE, pending MRI) She is appreciative of my time today and in agreement to defer med changes today Written instructions how to initiate psych/counseling evaluation Denis ETOH/drugs, SI/HI  Memory changes Normal MMSE recently, ? R/t severe depression vs structural vs med SE Labs were normal  Pending MRI by Dr. Jannifer Franklin, suggest psychiatry as per above for severe depression as remains poorly controlled with our attempts to manage by primary care; notably long history of severe depression and was managed by psych in the past  Periodic limb movement disorder (PLMD) Pending MRI plan to reduce requip and add low dose gabapentin per Dr. Jannifer Franklin  Hallucinations (visual and olfactory) Pending MRI; ? Visual r/t requip but doesn't explain new olfactory hallucinations Seeing Dr. Jannifer Franklin, suggest psych input for mood med management due to severe sx  No changes were recommended today; she expressed appreciation of my time in reviewing her recent appointment with Dr. Jannifer Franklin and in agreement with recommendations to defer on further mood med management. She states will reach out to psych today and defer any changes pending MRI.   Further disposition pending results of labs. Discussed med's effects and SE's.   Over 30 minutes of exam, counseling, chart review, and critical decision making was performed.   Future Appointments  Date Time Provider [place]  04/11/2020 10:00 AM Liane Comber,  NP [facility] None  06/27/2020 10:15 AM [name], NP [facility] None    ------------------------------------------------------------------------------------------------------------------   HPI BP 126/82   Pulse 88   Temp (!) 97.3 F (36.3 C)   Wt 188 lb (85.3 kg)   SpO2 98%   BMI 29.44 kg/m   69 y.o.female with hx of severe recurrent major depression, RLS, ADD, recent memory fog presents requesting to discuss her recent appointment with Dr. Jannifer Franklin, and also severe depression.   She has long history of recurrent severe major depression, has seen psych in the past and has been on lamictal, cymbalta, wellbutrin for many years. She retired prior to the pandemic and experience severe recurrence of depressed mood, accompanied by brain fog (recently normal MMSE here). In more recent months she reported episodes of visual hallucinations - cymbalta was reduced and low dose seroquel was added per Dr. Idell Pickles recommendation with improvement, but with continued episodes and debilitating depression she was referred to neurology and also recommended follow up with psych for further management of depression.   She saw Dr. Jannifer Franklin on 12/26/2019 due to gait disturbance, memory fog, hallucinations - at his appointment it became apparent she was also having olfactory hallucinations in addition to visual hallucinations. On his exam he felt mild gait instability without evidence of a significant peripheral neuropathy. He recommended MRI which is pending scheduling, labs (CMP, sed rate, B12, RPR) were normal. If MRI is negative he plans to reduce requip and add low dose gabapentin for restless legs, though discussed with her requip does not typically cause olfactory hallucinations.   She reports depression has been severe in recent weeks, presents wondering if increasing seroquel may be  possible. I reminded her this medication can cause worsening sedation, was added primarily to help manage hallucinations and  inquired whether she initiated contact with psychiatry which was recommended at last visit. She admits she forgot about these recommendations and is very open to pursuing this. I strongly feel she would most benefit from specialty input to manage her debilitating depression and would also benefit from counseling. She does report has been speaking to her brother daily and feels this is helping. She denies ETOH/drug use, denies SI/HI.    Past Medical History:  Diagnosis Date  . Anemia   . Depression   . Extrinsic asthma 11/09/2014  . Gait abnormality 12/26/2019  . GERD (gastroesophageal reflux disease)   . Hay fever   . Head trauma   . IBS (irritable bowel syndrome)   . Memory loss   . Migraines   . OSA on CPAP   . Reflux   . RLS (restless legs syndrome)   . Sinus congestion      Allergies  Allergen Reactions  . Erythromycin Other (See Comments)    TBS  . Iodinated Diagnostic Agents Other (See Comments)    Hot flashes, itching scalp Hot flashes, itching scalp  . Penicillins Hives  . Clarithromycin Other (See Comments)    Itching ankles  . Brintellix [Vortioxetine]     Itching    Current Outpatient Medications on File Prior to Visit  Medication Sig  . acetaminophen (TYLENOL) 650 MG CR tablet Take 650 mg by mouth every 8 (eight) hours as needed for pain.  Marland Kitchen buPROPion (WELLBUTRIN XL) 300 MG 24 hr tablet Take 1 tablet Daily for Mood  . DULoxetine (CYMBALTA) 30 MG capsule TAKE ONE CAPSULE BY MOUTH DAILY  . ezetimibe (ZETIA) 10 MG tablet Take 1 tablet (10 mg total) by mouth daily.  . famotidine (PEPCID) 20 MG tablet Take 1 tablet 2 x /day for Heartburn & Indigestion  . fenofibrate (TRICOR) 145 MG tablet Take 1 tablet Daily for Blood Fats (Triglycerides) (Patient taking differently: Take 145 mg by mouth daily. )  . lamoTRIgine (LAMICTAL) 100 MG tablet Take 1 tablet (100 mg total) by mouth daily.  . ondansetron (ZOFRAN ODT) 8 MG disintegrating tablet Take 1 tablet (8 mg total) by  mouth every 8 (eight) hours as needed for nausea or vomiting.  Marland Kitchen QUEtiapine (SEROQUEL) 50 MG tablet Take 1 tablet at Bedtime as needed for Seep  . rOPINIRole (REQUIP) 5 MG tablet Take1 tablets at Bedtime for "Restless  Legs"  . SUMAtriptan (IMITREX) 25 MG tablet Take 1-2 tab with onset of migraine. May repeat in 2 hours if headache persists. Max 4 tabs in 24 hours. (Patient taking differently: Take 25-50 mg by mouth See admin instructions. Take 1-2 tab with onset of migraine. May repeat in 2 hours if headache persists. Max 4 tabs in 24 hours.)   No current facility-administered medications on file prior to visit.    ROS: all negative except above.   Physical Exam:  BP 126/82   Pulse 88   Temp (!) 97.3 F (36.3 C)   Wt 188 lb (85.3 kg)   SpO2 98%   BMI 29.44 kg/m   General Appearance: Well nourished, in no apparent distress. Eyes: PERRLA, EOMs, conjunctiva no swelling or erythema Sinuses: No Frontal/maxillary tenderness ENT/Mouth: Ext aud canals clear, TMs without erythema, bulging. No erythema, swelling, or exudate on post pharynx.  Tonsils not swollen or erythematous. Hearing normal.  Neck: Supple, thyroid normal.  Respiratory: Respiratory effort normal, BS  equal bilaterally without rales, rhonchi, wheezing or stridor.  Cardio: RRR with no MRGs. Brisk peripheral pulses without edema.  Abdomen: Soft, + BS.  Non tender, no guarding, rebound, hernias, masses. Lymphatics: Non tender without lymphadenopathy.  Musculoskeletal: Full ROM, 5/5 strength, normal gait.  Skin: Warm, dry without rashes, lesions, ecchymosis.  Neuro: Cranial nerves intact. Normal muscle tone, no cerebellar symptoms. Sensation intact.  Psych: Awake and oriented X 3, mildly depressed affect, Insight and Judgment appropriate.     Izora Ribas, NP 8:51 AM [facility]

## 2020-01-03 NOTE — Patient Instructions (Addendum)
Recommend you schedule an appointment with psychiatry  Can check the back of your insurance carb for a phone number  - [employer] can provide names and numbers of people who are in network/covered  Needs to be initiated by patient - we can't place referral in this situation   Would benefit from expert input on meds and also counseling      Gabapentin capsules or tablets What is this medicine? GABAPENTIN (GA ba pen tin) is used to control seizures in certain types of epilepsy. It is also used to treat certain types of nerve pain. This medicine may be used for other purposes; ask your health care provider or pharmacist if you have questions. COMMON BRAND NAME(S): Active-PAC with Gabapentin, Gabarone, Neurontin What should I tell my health care provider before I take this medicine? They need to know if you have any of these conditions:  history of drug abuse or alcohol abuse problem  kidney disease  lung or breathing disease  suicidal thoughts, plans, or attempt; a previous suicide attempt by you or a family member  an unusual or allergic reaction to gabapentin, other medicines, foods, dyes, or preservatives  pregnant or trying to get pregnant  breast-feeding How should I use this medicine? Take this medicine by mouth with a glass of water. Follow the directions on the prescription label. You can take it with or without food. If it upsets your stomach, take it with food. Take your medicine at regular intervals. Do not take it more often than directed. Do not stop taking except on your doctor's advice. If you are directed to break the 600 or 800 mg tablets in half as part of your dose, the extra half tablet should be used for the next dose. If you have not used the extra half tablet within 28 days, it should be thrown away. A special MedGuide will be given to you by the pharmacist with each prescription and refill. Be sure to read this information carefully each time. Talk to  your pediatrician regarding the use of this medicine in children. While this drug may be prescribed for children as young as 3 years for selected conditions, precautions do apply. Overdosage: If you think you have taken too much of this medicine contact a poison control center or emergency room at once. NOTE: This medicine is only for you. Do not share this medicine with others. What if I miss a dose? If you miss a dose, take it as soon as you can. If it is almost time for your next dose, take only that dose. Do not take double or extra doses. What may interact with this medicine? This medicine may interact with the following medications:  alcohol  antihistamines for allergy, cough, and cold  certain medicines for anxiety or sleep  certain medicines for depression like amitriptyline, fluoxetine, sertraline  certain medicines for seizures like phenobarbital, primidone  certain medicines for stomach problems  general anesthetics like halothane, isoflurane, methoxyflurane, propofol  local anesthetics like lidocaine, pramoxine, tetracaine  medicines that relax muscles for surgery  narcotic medicines for pain  phenothiazines like chlorpromazine, mesoridazine, prochlorperazine, thioridazine This list may not describe all possible interactions. Give your health care provider a list of all the medicines, herbs, non-prescription drugs, or dietary supplements you use. Also tell them if you smoke, drink alcohol, or use illegal drugs. Some items may interact with your medicine. What should I watch for while using this medicine? Visit your doctor or health care provider  for regular checks on your progress. You may want to keep a record at home of how you feel your condition is responding to treatment. You may want to share this information with your doctor or health care provider at each visit. You should contact your doctor or health care provider if your seizures get worse or if you have any new  types of seizures. Do not stop taking this medicine or any of your seizure medicines unless instructed by your doctor or health care provider. Stopping your medicine suddenly can increase your seizures or their severity. This medicine may cause serious skin reactions. They can happen weeks to months after starting the medicine. Contact your health care provider right away if you notice fevers or flu-like symptoms with a rash. The rash may be red or purple and then turn into blisters or peeling of the skin. Or, you might notice a red rash with swelling of the face, lips or lymph nodes in your neck or under your arms. Wear a medical identification bracelet or chain if you are taking this medicine for seizures, and carry a card that lists all your medications. You may get drowsy, dizzy, or have blurred vision. Do not drive, use machinery, or do anything that needs mental alertness until you know how this medicine affects you. To reduce dizzy or fainting spells, do not sit or stand up quickly, especially if you are an older patient. Alcohol can increase drowsiness and dizziness. Avoid alcoholic drinks. Your mouth may get dry. Chewing sugarless gum or sucking hard candy, and drinking plenty of water will help. The use of this medicine may increase the chance of suicidal thoughts or actions. Pay special attention to how you are responding while on this medicine. Any worsening of mood, or thoughts of suicide or dying should be reported to your health care provider right away. Women who become pregnant while using this medicine may enroll in the [place] Pregnancy Registry by calling [phone]. This registry collects information about the safety of antiepileptic drug use during pregnancy. What side effects may I notice from receiving this medicine? Side effects that you should report to your doctor or health care professional as soon as possible:  allergic reactions like skin rash,  itching or hives, swelling of the face, lips, or tongue  breathing problems  rash, fever, and swollen lymph nodes  redness, blistering, peeling or loosening of the skin, including inside the mouth  suicidal thoughts, mood changes Side effects that usually do not require medical attention (report to your doctor or health care professional if they continue or are bothersome):  dizziness  drowsiness  headache  nausea, vomiting  swelling of ankles, feet, hands  tiredness This list may not describe all possible side effects. Call your doctor for medical advice about side effects. You may report side effects to FDA at 1-800-FDA-1088. Where should I keep my medicine? Keep out of reach of children. This medicine may cause accidental overdose and death if it taken by other adults, children, or pets. Mix any unused medicine with a substance like cat litter or coffee grounds. Then throw the medicine away in a sealed container like a sealed bag or a coffee can with a lid. Do not use the medicine after the expiration date. Store at room temperature between 15 and 30 degrees C (59 and 86 degrees F). NOTE: This sheet is a summary. It may not cover all possible information. If you have questions about this medicine, talk to  your doctor, pharmacist, or health care provider.  2020 Elsevier/Gold Standard (2018-11-05 14:16:43)      Living With Depression Everyone experiences occasional disappointment, sadness, and loss in their lives. When you are feeling down, blue, or sad for at least 2 weeks in a row, it may mean that you have depression. Depression can affect your thoughts and feelings, relationships, daily activities, and physical health. It is caused by changes in the way your brain functions. If you receive a diagnosis of depression, your health care provider will tell you which type of depression you have and what treatment options are available to you. If you are living with depression,  there are ways to help you recover from it and also ways to prevent it from coming back. How to cope with lifestyle changes Coping with stress     Stress is your body's reaction to life changes and events, both good and bad. Stressful situations may include:  Getting married.  The death of a spouse.  Losing a job.  Retiring.  Having a baby. Stress can last just a few hours or it can be ongoing. Stress can play a major role in depression, so it is important to learn both how to cope with stress and how to think about it differently. Talk with your health care provider or a counselor if you would like to learn more about stress reduction. He or she may suggest some stress reduction techniques, such as:  Music therapy. This can include creating music or listening to music. Choose music that you enjoy and that inspires you.  Mindfulness-based meditation. This kind of meditation can be done while sitting or walking. It involves being aware of your normal breaths, rather than trying to control your breathing.  Centering prayer. This is a kind of meditation that involves focusing on a spiritual word or phrase. Choose a word, phrase, or sacred image that is meaningful to you and that brings you peace.  Deep breathing. To do this, expand your stomach and inhale slowly through your nose. Hold your breath for 3-5 seconds, then exhale slowly, allowing your stomach muscles to relax.  Muscle relaxation. This involves intentionally tensing muscles then relaxing them. Choose a stress reduction technique that fits your lifestyle and personality. Stress reduction techniques take time and practice to develop. Set aside 5-15 minutes a day to do them. Therapists can offer training in these techniques. The training may be covered by some insurance plans. Other things you can do to manage stress include:  Keeping a stress diary. This can help you learn what triggers your stress and ways to control your  response.  Understanding what your limits are and saying no to requests or events that lead to a schedule that is too full.  Thinking about how you respond to certain situations. You may not be able to control everything, but you can control how you react.  Adding humor to your life by watching funny films or TV shows.  Making time for activities that help you relax and not feeling guilty about spending your time this way.  Medicines Your health care provider may suggest certain medicines if he or she feels that they will help improve your condition. Avoid using alcohol and other substances that may prevent your medicines from working properly (may interact). It is also important to:  Talk with your pharmacist or health care provider about all the medicines that you take, their possible side effects, and what medicines are safe to take together.  Make it your goal to take part in all treatment decisions (shared decision-making). This includes giving input on the side effects of medicines. It is best if shared decision-making with your health care provider is part of your total treatment plan. If your health care provider prescribes a medicine, you may not notice the full benefits of it for 4-8 weeks. Most people who are treated for depression need to be on medicine for at least 6-12 months after they feel better. If you are taking medicines as part of your treatment, do not stop taking medicines without first talking to your health care provider. You may need to have the medicine slowly decreased (tapered) over time to decrease the risk of harmful side effects. Relationships Your health care provider may suggest family therapy along with individual therapy and drug therapy. While there may not be family problems that are causing you to feel depressed, it is still important to make sure your family learns as much as they can about your mental health. Having your family's support can help make your  treatment successful. How to recognize changes in your condition Everyone has a different response to treatment for depression. Recovery from major depression happens when you have not had signs of major depression for two months. This may mean that you will start to:  Have more interest in doing activities.  Feel less hopeless than you did 2 months ago.  Have more energy.  Overeat less often, or have better or improving appetite.  Have better concentration. Your health care provider will work with you to decide the next steps in your recovery. It is also important to recognize when your condition is getting worse. Watch for these signs:  Having fatigue or low energy.  Eating too much or too little.  Sleeping too much or too little.  Feeling restless, agitated, or hopeless.  Having trouble concentrating or making decisions.  Having unexplained physical complaints.  Feeling irritable, angry, or aggressive. Get help as soon as you or your family members notice these symptoms coming back. How to get support and help from others How to talk with friends and family members about your condition  Talking to friends and family members about your condition can provide you with one way to get support and guidance. Reach out to trusted friends or family members, explain your symptoms to them, and let them know that you are working with a health care provider to treat your depression. Financial resources Not all insurance plans cover mental health care, so it is important to check with your insurance carrier. If paying for co-pays or counseling services is a problem, search for a local or county mental health care center. They may be able to offer public mental health care services at low or no cost when you are not able to see a private health care provider. If you are taking medicine for depression, you may be able to get the generic form, which may be less expensive. Some makers of  prescription medicines also offer help to patients who cannot afford the medicines they need. Follow these instructions at home:   Get the right amount and quality of sleep.  Cut down on using caffeine, tobacco, alcohol, and other potentially harmful substances.  Try to exercise, such as walking or lifting small weights.  Take over-the-counter and prescription medicines only as told by your health care provider.  Eat a healthy diet that includes plenty of vegetables, fruits, whole grains, low-fat dairy products, and lean  protein. Do not eat a lot of foods that are high in solid fats, added sugars, or salt.  Keep all follow-up visits as told by your health care provider. This is important. Contact a health care provider if:  You stop taking your antidepressant medicines, and you have any of these symptoms: ? Nausea. ? Headache. ? Feeling lightheaded. ? Chills and body aches. ? Not being able to sleep (insomnia).  You or your friends and family think your depression is getting worse. Get help right away if:  You have thoughts of hurting yourself or others. If you ever feel like you may hurt yourself or others, or have thoughts about taking your own life, get help right away. You can go to your nearest emergency department or call:  Your local emergency services (911 in the [place]  A suicide crisis helpline, such as the [facility] at (787)331-7354. This is open 24-hours a day. Summary  If you are living with depression, there are ways to help you recover from it and also ways to prevent it from coming back.  Work with your health care team to create a management plan that includes counseling, stress management techniques, and healthy lifestyle habits. This information is not intended to replace advice given to you by your health care provider. Make sure you discuss any questions you have with your health care provider. Document Revised: 11/26/2018  Document Reviewed: 07/07/2016 Elsevier Patient Education  [employer].

## 2020-01-04 ENCOUNTER — Other Ambulatory Visit: Payer: Self-pay | Admitting: Adult Health

## 2020-01-04 DIAGNOSIS — F319 Bipolar disorder, unspecified: Secondary | ICD-10-CM

## 2020-01-05 ENCOUNTER — Ambulatory Visit
Admission: RE | Admit: 2020-01-05 | Discharge: 2020-01-05 | Disposition: A | Discharge status: Home / Self Care | Payer: Medicare HMO | Source: Ambulatory Visit | Attending: Internal Medicine | Admitting: Internal Medicine

## 2020-01-05 ENCOUNTER — Other Ambulatory Visit: Payer: Self-pay | Admitting: Internal Medicine

## 2020-01-05 ENCOUNTER — Other Ambulatory Visit: Payer: Self-pay

## 2020-01-05 DIAGNOSIS — R928 Other abnormal and inconclusive findings on diagnostic imaging of breast: Secondary | ICD-10-CM

## 2020-01-05 DIAGNOSIS — N6310 Unspecified lump in the right breast, unspecified quadrant: Secondary | ICD-10-CM

## 2020-01-05 DIAGNOSIS — N6312 Unspecified lump in the right breast, upper inner quadrant: Secondary | ICD-10-CM | POA: Diagnosis not present

## 2020-01-11 ENCOUNTER — Other Ambulatory Visit: Payer: Self-pay | Admitting: Internal Medicine

## 2020-01-11 DIAGNOSIS — G2581 Restless legs syndrome: Secondary | ICD-10-CM

## 2020-01-12 ENCOUNTER — Other Ambulatory Visit: Payer: Self-pay | Admitting: Adult Health

## 2020-01-12 ENCOUNTER — Other Ambulatory Visit: Payer: Medicare HMO

## 2020-01-12 DIAGNOSIS — F339 Major depressive disorder, recurrent, unspecified: Secondary | ICD-10-CM

## 2020-01-12 DIAGNOSIS — R443 Hallucinations, unspecified: Secondary | ICD-10-CM

## 2020-01-20 ENCOUNTER — Ambulatory Visit
Admission: RE | Admit: 2020-01-20 | Discharge: 2020-01-20 | Disposition: A | Discharge status: Home / Self Care | Payer: Medicare HMO | Source: Ambulatory Visit | Attending: Neurology | Admitting: Neurology

## 2020-01-20 ENCOUNTER — Other Ambulatory Visit: Payer: Self-pay

## 2020-01-20 DIAGNOSIS — R413 Other amnesia: Secondary | ICD-10-CM

## 2020-01-20 MED ORDER — GADOBENATE DIMEGLUMINE 529 MG/ML IV SOLN
17.0000 mL | Freq: Once | INTRAVENOUS | Status: AC | PRN
  Administered 2020-01-20: 17 mL via INTRAVENOUS

## 2020-01-23 ENCOUNTER — Telehealth: Payer: Self-pay | Admitting: Neurology

## 2020-01-23 NOTE — Telephone Encounter (Signed)
  I called the patient.  MRI of the brain is essentially normal for age, no significant atrophy or anything that might cause olfactory hallucinations.  MRI brain 01/23/20:  IMPRESSION:   Unremarkable MRI brain (with and without). No acute findings.

## 2020-02-09 ENCOUNTER — Other Ambulatory Visit: Payer: Self-pay

## 2020-02-09 ENCOUNTER — Ambulatory Visit (INDEPENDENT_AMBULATORY_CARE_PROVIDER_SITE_OTHER): Payer: Medicare HMO | Admitting: Psychiatry

## 2020-02-09 ENCOUNTER — Encounter: Payer: Self-pay | Admitting: Psychiatry

## 2020-02-09 VITALS — BP 138/79 | HR 96 | Ht 67.0 in | Wt 187.0 lb

## 2020-02-09 DIAGNOSIS — F339 Major depressive disorder, recurrent, unspecified: Secondary | ICD-10-CM | POA: Diagnosis not present

## 2020-02-09 NOTE — Progress Notes (Signed)
[facility] MD/PA/NP Initial Note  02/10/2020 4:47 PM Kimberly Ray  MRN:  [id number]  Chief Complaint:  Chief Complaint    Memory Loss; Depression; Hallucinations      HPI: Patient is a 69 year old female being seen for initial evaluation. She reports "I've had trouble with depression my whole adult life... things have changed with me cognitively." She has questioned at times if she is on too much medication.  She reports that she was recently seen by neurology for changes in cognition and possible neurological symptoms.  She reports that several years ago she started smelling tobacco when no one else smelled it. She has not smelled tobacco recently. This morning thought she smelled mildew in an area where she knows there is not mildew. She reports that she has had more hallucinations recently. Has had occasions where she thought she saw something move out of her peripheral vision. On one occasion she thought she saw things moving under her comforter. She had one occasion where she felt something run against her leg. Has briefly seen shadows of people that were not present. Thought she has seen family members in her bed and then realized they were not there. She thought she felt an earthquake on one occasion.   She reports for years she has noticed changes in her memory. She will go to pick up an object and pick something else up instead. Occasionally will not recognize familiar objects. She reports that she does not like to drive and that she is slower to react. Difficulty with concentration and trouble recalling what she has read. She reports some difficulty with word finding. Has had trouble remembering someone's name that she used to work with.Will lost train of thought while telling stories. Sometimes has difficulty recalling what she did the day before. She reports long-standing difficulty with decision making. Reports that she has had head injuries x 3. Reports that she may have had LOC  after head injury in childhood.   Mood has been persistently sad. She reports that family member said that she has some anger that is disproportionate to the situation. She reports that she has been sleeping excessively and is sleeping 18 hours a day. She reports h/o excessive sleep with depression in the past. Wears cPap machine. She reports that she is easily fatigued. Low energy and motivation. Reports excessive guilt. Appetite has been increased. Diminished interest in things. Denies anhedonia. Denies SI.   She reports that she has had period of remission of depression. Reports seasonal affective d/o and has responded well to light therapy. She reports that she notices some mild SAD in August.   She reports that she has some perfectionistic thinking and a "thinker" and tends to analyze things. Denies any physical s/s with anxiety. Denies any obsessions or compulsions.   Denies any past manic s/s. She reports that she has sometimes thought she heard soft music in the car. Denies any other AH. Denies delusions or paranoia.   Social History: Born and raised in [place]. Lived in [place] briefly and [place]. Reports that her parents were perfectionistic and father was "very strict." Has a brother that is 12.5 years younger and a sister that is one year younger. Reports that she is not close with her siser. Close with her brother. 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 Father is deceased. Attended beauty school. 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 Married twice. First marriage  was for 11 years and had 2 children. Re-married and marriage lasted 5 years until they divorced. Has 2 sons that live nearby and she has a good relationship with them. [place]. Brother is supportive. Has some supportive friends. No current hobbies or interests. Enjoys eating out.  Past Psychiatric Medication  Trials: Seroquel-increased appetite. Unsure of response. Has taken 6-9 months. Denies any worsening in unsteadiness.  Lamictal- Has seemed to be helpful to prevent recurrence of depression. Taken long-term.  Cymbalta- Had been on 60 mg in the past. Initially was helpful Prozac- first medication prescribed and "it was like magic" and took for years Viibryd Brintellix Wellbutrin XL- Has taken 150 mg and 300 mg. Has taken long-term. Seemed to be effective initially and then no longer as effective.  Nortriptyline Gabapentin Xanax Methylphenidate- took for excessive daytime somnolence  Visit Diagnosis: No diagnosis found.  Past Psychiatric History: Has seen a psychiatrist twice. Saw psychiatrist when she and her husband had some marital difficulties. Saw a therapist in the past.   Past Medical History:  Past Medical History:  Diagnosis Date  . Anemia   . Depression   . Extrinsic asthma 11/09/2014  . Gait abnormality 12/26/2019  . GERD (gastroesophageal reflux disease)   . Hay fever   . Head trauma   . IBS (irritable bowel syndrome)   . Memory loss   . Migraines   . OSA on CPAP   . Reflux   . RLS (restless legs syndrome)   . Sinus congestion     Past Surgical History:  Procedure Laterality Date  . ABDOMINAL HYSTERECTOMY    . BREAST BIOPSY Right 02/27/2014   Stereo- Benign  . CHOLECYSTECTOMY  11/03/2017   Dr. Rosendo Gros  . LAPAROSCOPIC HYSTERECTOMY  12/2017  . NASAL SINUS SURGERY    . ORIF ANKLE FRACTURE BIMALLEOLAR Left 03/14/2019   [facility], Dr. Sheran Lawless  . perforated eardrum     left side  . TONSILLECTOMY AND ADENOIDECTOMY    . URETEROSCOPY     for ureteral stone    Family Psychiatric History: Pt reports that her father was admitted to asylum when she was young and pt does not know details. Father took Xanax long-term.   Family History:  Family History  Problem Relation Age of Onset  . Diabetes Mother   . Heart failure Father   . Dementia Father   . Mood Disorder  Father   . Anxiety disorder Father   . Diabetes Sister   . Anxiety disorder Sister   . Depression Sister   . Breast cancer Maternal Grandmother 19  . Breast cancer Maternal Aunt 79  . Breast cancer Cousin   . Osteoporosis Brother   . Bipolar disorder Brother   . Anxiety disorder Brother   . Heart failure Maternal Grandfather 80  . Colon cancer Paternal Grandmother 43  . Mental illness Paternal Grandmother   . Stomach cancer Maternal Aunt     Social History:  Social History   Socioeconomic History  . Marital status: Divorced    Spouse name: Not on file  . Number of children: 2  . Years of education: 80  . Highest education level: Not on file  Occupational History    Employer: [employer]    Comment: Flight Attendant  Tobacco Use  . Smoking status: Never Smoker  . Smokeless tobacco: Never Used  Vaping Use  . Vaping Use: Never used  Substance and Sexual Activity  . Alcohol use: Yes  Alcohol/week: 1.0 standard drink    Types: 1 Shots of liquor per week    Comment: Once a year  . Drug use: No  . Sexual activity: Not Currently  Other Topics Concern  . Not on file  Social History Narrative   Patient is single and lives at home alone. Patient is flight attendant. Patient has college education one year      Right handed.   Caffeine- Rare.    Social Determinants of Health   Financial Resource Strain:   . Difficulty of Paying Living Expenses:   Food Insecurity:   . Worried About Charity fundraiser in the Last Year:   . Arboriculturist in the Last Year:   Transportation Needs:   . Film/video editor (Medical):   Marland Kitchen Lack of Transportation (Non-Medical):   Physical Activity:   . Days of Exercise per Week:   . Minutes of Exercise per Session:   Stress:   . Feeling of Stress :   Social Connections:   . Frequency of Communication with Friends and Family:   . Frequency of Social Gatherings with Friends and Family:   . Attends Religious Services:   . Active  Member of Clubs or Organizations:   . Attends Archivist Meetings:   Marland Kitchen Marital Status:     Allergies:  Allergies  Allergen Reactions  . Erythromycin Other (See Comments)    TBS  . Iodinated Diagnostic Agents Other (See Comments)    Hot flashes, itching scalp Hot flashes, itching scalp  . Penicillins Hives  . Clarithromycin Other (See Comments)    Itching ankles  . Brintellix [Vortioxetine]     Itching    Metabolic Disorder Labs: Lab Results  Component Value Date   HGBA1C 6.6 (H) 10/27/2019   MPG 143 10/27/2019   MPG 134 08/04/2019   No results found for: PROLACTIN Lab Results  Component Value Date   CHOL 190 10/27/2019   TRIG 321 (H) 10/27/2019   HDL 43 (L) 10/27/2019   CHOLHDL 4.4 10/27/2019   VLDL 61 (H) 01/20/2017   LDLCALC 104 (H) 10/27/2019   LDLCALC 77 08/04/2019   Lab Results  Component Value Date   TSH 3.06 10/27/2019   TSH 1.60 08/04/2019    Therapeutic Level Labs: No results found for: LITHIUM No results found for: VALPROATE No components found for:  CBMZ  Current Medications: Current Outpatient Medications  Medication Sig Dispense Refill  . acetaminophen (TYLENOL) 650 MG CR tablet Take 650 mg by mouth every 8 (eight) hours as needed for pain.    Marland Kitchen buPROPion (WELLBUTRIN XL) 300 MG 24 hr tablet TAKE ONE TABLET DAILY FOR MOOD 90 tablet 0  . cholecalciferol (VITAMIN D3) 25 MCG (1000 UNIT) tablet Take 1,000 Units by mouth daily.    . Coenzyme Q10 (CO Q 10 PO) Take by mouth.    . DULoxetine (CYMBALTA) 30 MG capsule TAKE ONE CAPSULE BY MOUTH DAILY 90 capsule 1  . ezetimibe (ZETIA) 10 MG tablet Take 1 tablet (10 mg total) by mouth daily. 90 tablet 1  . famotidine (PEPCID) 20 MG tablet TAKE ONE TABLET TWICE A DAY FOR HEARTBURN AND INDIGESTION 180 tablet 0  . fenofibrate (TRICOR) 145 MG tablet Take 1 tablet Daily for Blood Fats (Triglycerides) (Patient taking differently: Take 145 mg by mouth daily. ) 90 tablet 3  . lamoTRIgine (LAMICTAL) 100 MG  tablet TAKE ONE TABLET BY MOUTH DAILY 83 tablet 0  . Omega-3 1000 MG CAPS Take  by mouth.    . ondansetron (ZOFRAN ODT) 8 MG disintegrating tablet Take 1 tablet (8 mg total) by mouth every 8 (eight) hours as needed for nausea or vomiting. 60 tablet 0  . QUEtiapine (SEROQUEL) 50 MG tablet Take 1 tablet at Bedtime as needed for Seep 90 tablet 1  . rOPINIRole (REQUIP) 1 MG tablet TAKE THREE TABLETS  EVERY NIGHT AT BEDTIME FOR RESTLESS LEGS 270 tablet 0  . rOPINIRole (REQUIP) 5 MG tablet Take1 tablets at Bedtime for "Restless  Legs" 90 tablet 3  . SUMAtriptan (IMITREX) 25 MG tablet Take 1-2 tab with onset of migraine. May repeat in 2 hours if headache persists. Max 4 tabs in 24 hours. (Patient taking differently: Take 25-50 mg by mouth See admin instructions. Take 1-2 tab with onset of migraine. May repeat in 2 hours if headache persists. Max 4 tabs in 24 hours.) 30 tablet 0   No current facility-administered medications for this visit.    Medication Side Effects: Possible gait disturbance, tremors  Orders placed this visit:  No orders of the defined types were placed in this encounter.   Psychiatric Specialty Exam:  Review of Systems  Constitutional: Positive for fatigue.  HENT: Positive for congestion, hearing loss and sinus pain.   Eyes: Negative.   Respiratory: Positive for cough and shortness of breath.   Cardiovascular: Positive for palpitations.  Gastrointestinal: Positive for nausea.  Endocrine: Positive for polydipsia.  Genitourinary: Positive for frequency and urgency.  Musculoskeletal: Positive for arthralgias, back pain and myalgias. Negative for gait problem.       Increased TMJ  Skin: Negative.        Itching  Allergic/Immunologic: Positive for environmental allergies.  Neurological: Positive for tremors, weakness and headaches.       Sensory changes (hearing, vision, smell)  Hematological: Negative.   Psychiatric/Behavioral:       Please refer to HPI    Blood pressure  138/79, pulse 96, height 5\' 7"  (1.702 m), weight 187 lb (84.8 kg).Body mass index is 29.29 kg/m.  General Appearance: Casual  Eye Contact:  Good  Speech:  Clear and Coherent and Normal Rate  Volume:  Normal  Mood:  Anxious and Depressed  Affect:  Appropriate, Congruent, Full Range and Anxious  Thought Process:  Coherent, Goal Directed, Linear and Descriptions of Associations: Intact  Orientation:  Full (Time, Place, and Person)  Thought Content: Logical and Hallucinations: None   Suicidal Thoughts:  No  Homicidal Thoughts:  No  Memory:  Recent;   Fair  Judgement:  Good  Insight:  Good  Psychomotor Activity:  Normal  Concentration:  Concentration: Fair and Attention Span: Fair  Recall:  Good  Fund of Knowledge: Good  Language: Good  Assets:  Communication Skills Desire for Improvement Resilience Social Support  ADL's:  Intact  Cognition: WNL  Prognosis:  Good   Screenings:  Mini-Mental     Office Visit from 12/26/2019 in [facility] Office Visit from 08/04/2019 in [facility]  Total Score (max 30 points ) 27 30    PHQ2-9     Office Visit from 10/27/2019 in [facility] Office Visit from 09/01/2019 in [facility] Office Visit from 08/04/2019 in [facility] Office Visit from 08/16/2018 in [facility] Office Visit from 04/29/2018 in [facility]  PHQ-2 Total Score 0 2 5 6 2   PHQ-9 Total Score -- 11 21 22  9  Receiving Psychotherapy: No   Treatment Plan/Recommendations: Patient seen for 60 minutes and time spent counseling patient regarding recent signs and symptoms and possible causes, to include medication side effects potentially causing tremors and changes in her gait, depression causing difficulties with concentration and focus, and ropinirole possibly  contributing to hallucinations/perceptual disturbances.  Discussed that ropinirole appears twice in her medication list, once as 1 mg and also 5 mg.  Patient reports that she is not certain which dosage she is currently taking.  Recommended that she check her vitals when she gets home to confirm how much ropinirole she is currently taking.  Discussed stopping Seroquel 50 mg at bedtime since this could be causing excessive somnolence, tremors, and gait disturbance; she also works limited benefit with Seroquel.  Patient advised to contact office if she experiences recurrence of hallucinations.  Discussed also considering other treatment options for depression since she reports that her depressive signs and symptoms are not currently well controlled.  Attempted to call her brother,(Jim Rigsbee [phone] to obtain, additional history since patient reports that she is unable to recall responses to medications and the onset of some of her signs and symptoms. Patient to follow-up in 3 to 4 weeks or sooner if clinically indicated.Patient advised to contact office with any questions, adverse effects, or acute worsening in signs and symptoms.    Thayer Headings, PMHNP

## 2020-02-09 NOTE — Patient Instructions (Signed)
Please check bottles of Ropinorole at home to confirm what dose you are taking.

## 2020-02-10 ENCOUNTER — Encounter: Payer: Self-pay | Admitting: Psychiatry

## 2020-02-15 ENCOUNTER — Telehealth: Payer: Self-pay | Admitting: Psychiatry

## 2020-02-15 NOTE — Telephone Encounter (Signed)
Called pt's brother.  He reports that they have family h/o Bipolar D/O and depression. He suspects that their father had Bipolar d/o. Brother has noticed patient may have some hypomania and irritability, with predominantly more depressive episodes. He reports that she has gradually had increased difficulty with memory and will tell him the same story several times without recalling this. Brother has had positive response to Lamictal and Abilify.  He reports that pt's hallucinations have seemed to improve. He reports that she was convinced that there were mice in the house and had family members check this for her and put steel wool in any holes and openings.   He reports that she may not be consistent with taking medication due to memory issues. He reports that she has become more accepting of need to take medication.   Brother reports that Seroquel seems to have had a positive effect on her mood and motivation. He reports that he has not noticed any worsening neurological s/s since she started Seroquel. He reports that she has always slept for extended periods of time prior to Seroquel.  He reports that he feels that pt has difficulty getting motivated. He has been encouraging her to find a passion. He reports that she seems to continue to grieve the loss of her employment. He reports that she has not made necessary repairs to her house over several years, even after brother offered to pay for the repairs if she can schedule someone to do the work.  Their father had "Parkinson's dementia" and he had hallucinations.  Encouraged brother to contact office if he has any concerns about possible side effects or worsening s/s in the future.

## 2020-03-01 ENCOUNTER — Other Ambulatory Visit: Payer: Self-pay

## 2020-03-01 ENCOUNTER — Ambulatory Visit (INDEPENDENT_AMBULATORY_CARE_PROVIDER_SITE_OTHER): Payer: Medicare HMO | Admitting: Psychiatry

## 2020-03-01 ENCOUNTER — Encounter: Payer: Self-pay | Admitting: Psychiatry

## 2020-03-01 DIAGNOSIS — F39 Unspecified mood [affective] disorder: Secondary | ICD-10-CM | POA: Diagnosis not present

## 2020-03-01 DIAGNOSIS — H524 Presbyopia: Secondary | ICD-10-CM | POA: Diagnosis not present

## 2020-03-01 MED ORDER — LAMOTRIGINE 150 MG PO TABS: 150.0000 mg | ORAL_TABLET | Freq: Every day | ORAL | 1 refills | Status: DC

## 2020-03-01 MED ORDER — DULOXETINE HCL 30 MG PO CPEP: 30.0000 mg | ORAL_CAPSULE | Freq: Every day | ORAL | 1 refills | Status: DC

## 2020-03-01 NOTE — Progress Notes (Signed)
Kimberly Ray [id number] October 17, 1950 69 y.o.  Subjective:   Patient ID:  Kimberly Ray is a 69 y.o. (DOB [birth year]) female.  Chief Complaint:  Chief Complaint  Patient presents with   Follow-up    Depression, h/o hallucinations    HPI Kimberly Ray presents to the office today for follow-up of depression, cognitive changes, and h/o hallucinations. She reports that she is "doing better with the depression... and the leg shaking." Reports that she noticed improved leg shaking last week until about Friday. Feels that when legs were not shaking as much the she was better cognitively. "The depression has been greatly improved." She reports that she feels that she has had some low level underlying depression. She reports that she has been able to complete more tasks to include getting caught up on her laundry. Improved energy and motivation. She reports frequent analyzing and difficulty making decisions and that she is fearful of making a mistake. Notices some procrastination and reports that this has been long-standing. "I work well under pressure." Has started going to the pool and is looking forward to this and enjoying it. Met with a group to walk this morning for the first time since ankle fx last year. She reports that she will occasionally stay up late while binge watching a show and then sleep later. She reports over the last week she has been waking up earlier. Has not been napping as much. Estimates sleeping about 8-10 hours in a 24-hours period. Appetite has been good. Has noticed food cravings have been less since stopping Seroquel. She reports improved concentration and focus and has been reading a book. Occasionally has to refer back to what she has read. Denies SI.   She reports that yesterday she put an empty coffee pot in a cabinet. Denies any significant changes in memory or cognition.   Has started taking Ropinirole earlier around 6 pm and this has been  helpful for RLS.  Recalling dreams for the first time in an extended period of time.   Denies any recent hallucinations.   Reports that her talks with her brother that is a chaplain have been helpful for her mood. Reports that CBT was helpful for her in the past. Recalls depressive s/s in adolescence.   Re-started Vitamin D and has noticed her mood has been lower in the past when she has not taken Vitamin D and Omega 3.  Past Psychiatric Medication Trials: Seroquel-increased appetite. Unsure of response. Has taken 6-9 months. Denies any worsening in unsteadiness.  Lamictal- Has seemed to be helpful to prevent recurrence of depression. Taken long-term.  Cymbalta- Had been on 60 mg in the past. Initially was helpful Prozac- first medication prescribed and "it was like magic" and took for years Viibryd Brintellix Wellbutrin XL- Has taken 150 mg and 300 mg. Has taken long-term. Seemed to be effective initially and then no longer as effective.  Nortriptyline Gabapentin Xanax Methylphenidate- took for excessive daytime somnolence    Mini-Mental     Office Visit from 12/26/2019 in [facility] Office Visit from 08/04/2019 in [facility]  Total Score (max 30 points ) 27 30    PHQ2-9     Office Visit from 10/27/2019 in [facility] [place] Office Visit from 09/01/2019 in [facility] [place] Office Visit from 08/04/2019 in [facility] [place] Office Visit from 08/16/2018 in [facility] [place] Office Visit from 04/29/2018 in [facility]  PHQ-2 Total Score 0 '2 5 6 2  ' PHQ-9 Total Score -- '11 21 22 9       ' Review of Systems:  Review of Systems  Musculoskeletal: Negative for gait problem.  Skin:       Notices some light marks on her skin and indentations.   Neurological:       Reports decreased leg  shaking.   Psychiatric/Behavioral:       Please refer to HPI    Medications: I have reviewed the patient's current medications.  Current Outpatient Medications  Medication Sig Dispense Refill   buPROPion (WELLBUTRIN XL) 300 MG 24 hr tablet TAKE ONE TABLET DAILY FOR MOOD 90 tablet 0   cholecalciferol (VITAMIN D3) 25 MCG (1000 UNIT) tablet Take 1,000 Units by mouth daily.     Omega-3 1000 MG CAPS Take by mouth.     rOPINIRole (REQUIP) 1 MG tablet TAKE THREE TABLETS  EVERY NIGHT AT BEDTIME FOR RESTLESS LEGS 270 tablet 0   acetaminophen (TYLENOL) 650 MG CR tablet Take 650 mg by mouth every 8 (eight) hours as needed for pain.     Coenzyme Q10 (CO Q 10 PO) Take by mouth.     DULoxetine (CYMBALTA) 30 MG capsule Take 1 capsule (30 mg total) by mouth daily. 90 capsule 1   ezetimibe (ZETIA) 10 MG tablet Take 1 tablet (10 mg total) by mouth daily. 90 tablet 1   famotidine (PEPCID) 20 MG tablet TAKE ONE TABLET TWICE A DAY FOR HEARTBURN AND INDIGESTION 180 tablet 0   fenofibrate (TRICOR) 145 MG tablet Take 1 tablet Daily for Blood Fats (Triglycerides) (Patient taking differently: Take 145 mg by mouth daily. ) 90 tablet 3   lamoTRIgine (LAMICTAL) 150 MG tablet Take 1 tablet (150 mg total) by mouth daily. 30 tablet 1   ondansetron (ZOFRAN ODT) 8 MG disintegrating tablet Take 1 tablet (8 mg total) by mouth every 8 (eight) hours as needed for nausea or vomiting. 60 tablet 0   SUMAtriptan (IMITREX) 25 MG tablet Take 1-2 tab with onset of migraine. May repeat in 2 hours if headache persists. Max 4 tabs in 24 hours. (Patient taking differently: Take 25-50 mg by mouth See admin instructions. Take 1-2 tab with onset of migraine. May repeat in 2 hours if headache persists. Max 4 tabs in 24 hours.) 30 tablet 0   No current facility-administered medications for this visit.    Medication Side Effects: None  Allergies:  Allergies  Allergen Reactions   Erythromycin Other (See Comments)    TBS    Iodinated Diagnostic Agents Other (See Comments)    Hot flashes, itching scalp Hot flashes, itching scalp   Penicillins Hives   Clarithromycin Other (See Comments)    Itching ankles   Brintellix [Vortioxetine]     Itching    Past Medical History:  Diagnosis Date   Anemia    Depression    Extrinsic asthma 11/09/2014   Gait abnormality 12/26/2019   GERD (gastroesophageal reflux disease)    Hay fever    Head trauma    IBS (irritable bowel syndrome)    Memory loss    Migraines    OSA on CPAP    Reflux    RLS (restless legs syndrome)    Sinus congestion     Family History  Problem Relation Age of Onset   Diabetes Mother    Heart failure Father    Dementia Father    Mood Disorder Father    Anxiety  disorder Father    Diabetes Sister    Anxiety disorder Sister    Depression Sister    Breast cancer Maternal Grandmother 67   Breast cancer Maternal Aunt 45   Breast cancer Cousin    Osteoporosis Brother    Bipolar disorder Brother    Anxiety disorder Brother    Heart failure Maternal Grandfather 47   Colon cancer Paternal Grandmother 61   Mental illness Paternal Grandmother    Stomach cancer Maternal Aunt     Social History   Socioeconomic History   Marital status: Divorced    Spouse name: Not on file   Number of children: 2   Years of education: 13   Highest education level: Not on file  Occupational History    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 Patient is flight attendant. Patient  has college education one year      Right handed.   Caffeine- Rare.    Social Determinants of Health   Financial Resource Strain:    Difficulty of Paying Living Expenses:   Food Insecurity:    Worried About Charity fundraiser in the Last Year:    Arboriculturist in the Last Year:   Transportation Needs:    Film/video editor (Medical):    Lack of Transportation (Non-Medical):   Physical Activity:    Days of Exercise per Week:    Minutes of Exercise per Session:   Stress:    Feeling of Stress :   Social Connections:    Frequency of Communication with Friends and Family:    Frequency of Social Gatherings with Friends and Family:    Attends Religious Services:    Active Member of Clubs or Organizations:    Attends Music therapist:    Marital Status:   Intimate Partner Violence:    Fear of Current or Ex-Partner:    Emotionally Abused:    Physically Abused:    Sexually Abused:     Past Medical History, Surgical history, Social history, and Family history were reviewed and updated as appropriate.   Please see review of systems for further details on the patient's review from today.   Objective:   Physical Exam:  There were no vitals taken for this visit.  Physical Exam Constitutional:      General: She is not in acute distress. Musculoskeletal:        General: No deformity.  Neurological:     Mental Status: She is alert and oriented to person, place, and time.     Coordination: Coordination normal.  Psychiatric:        Attention and Perception: Attention and perception normal. She does not perceive auditory or visual hallucinations.        Mood and Affect: Mood normal. Mood is not anxious or depressed. Affect is not labile, blunt, angry or inappropriate.        Speech: Speech normal.  Behavior: Behavior normal.        Thought Content: Thought content normal. Thought content is not paranoid or delusional. Thought content does  not include homicidal or suicidal ideation. Thought content does not include homicidal or suicidal plan.        Cognition and Memory: Cognition and memory normal.        Judgment: Judgment normal.     Comments: Insight intact     Lab Review:     Component Value Date/Time   NA 141 12/26/2019 1227   K 4.7 12/26/2019 1227   CL 105 12/26/2019 1227   CO2 23 12/26/2019 1227   GLUCOSE 75 12/26/2019 1227   GLUCOSE 133 (H) 10/27/2019 0947   BUN 16 12/26/2019 1227   CREATININE 1.10 (H) 12/26/2019 1227   CREATININE 0.91 10/27/2019 0947   CALCIUM 10.5 (H) 12/26/2019 1227   PROT 6.8 12/26/2019 1227   ALBUMIN 4.8 12/26/2019 1227   AST 58 (H) 12/26/2019 1227   ALT 41 (H) 12/26/2019 1227   ALKPHOS 90 12/26/2019 1227   BILITOT 0.2 12/26/2019 1227   GFRNONAA 51 (L) 12/26/2019 1227   GFRNONAA 64 10/27/2019 0947   GFRAA 59 (L) 12/26/2019 1227   GFRAA 75 10/27/2019 0947       Component Value Date/Time   WBC 5.0 10/27/2019 0947   RBC 4.78 10/27/2019 0947   HGB 13.2 10/27/2019 0947   HCT 42.0 10/27/2019 0947   PLT 317 10/27/2019 0947   MCV 87.9 10/27/2019 0947   MCH 27.6 10/27/2019 0947   MCHC 31.4 (L) 10/27/2019 0947   RDW 12.8 10/27/2019 0947   LYMPHSABS 1,230 10/27/2019 0947   MONOABS 576 01/20/2017 1006   EOSABS 60 10/27/2019 0947   BASOSABS 40 10/27/2019 0947    No results found for: POCLITH, LITHIUM   No results found for: PHENYTOIN, PHENOBARB, VALPROATE, CBMZ   .res Assessment: Plan:   Pt seen for 30 minutes. Discussed potential benefits, risks, and side effects of increasing Lamictal to 150 mg po qd since she reports that Lamictal has been partially effective for mood s/s at 100 mg dose and she is concerned that improved depressive s/s may only be temporary. Discussed monitoring for rash and contacting office if rash occurs.  Will not re-start an anti-psychotic at this time since pt is denying any psychotic s/s and reports improvement in mood, excessive daytime somnolence,  and physical s/s with discontinuation of Seroquel.  Continue Cymbalta 30 mg po qd for mood s/s.  Pt to f/u in 4 weeks or sooner if clinically indicated.  Patient advised to contact office with any questions, adverse effects, or acute worsening in signs and symptoms.  Umi was seen today for follow-up.  Diagnoses and all orders for this visit:  Episodic mood disorder (HCC) -     lamoTRIgine (LAMICTAL) 150 MG tablet; Take 1 tablet (150 mg total) by mouth daily. -     DULoxetine (CYMBALTA) 30 MG capsule; Take 1 capsule (30 mg total) by mouth daily.     Please see After Visit Summary for patient specific instructions.  Future Appointments  Date Time Provider [place]  03/29/2020 11:00 AM Thayer Headings, PMHNP CP-CP None  04/11/2020 10:00 AM Liane Comber, NP [facility] None  06/27/2020 10:15 AM [name], NP [facility] None  07/10/2020 12:30 PM [facility] [name] 1 [facility]    No orders of the defined types were placed in this encounter.   -------------------------------

## 2020-03-02 DIAGNOSIS — Z0100 Encounter for examination of eyes and vision without abnormal findings: Secondary | ICD-10-CM | POA: Diagnosis not present

## 2020-03-12 ENCOUNTER — Other Ambulatory Visit: Payer: Self-pay | Admitting: Internal Medicine

## 2020-03-12 ENCOUNTER — Other Ambulatory Visit: Payer: Self-pay | Admitting: Adult Health

## 2020-03-12 ENCOUNTER — Telehealth: Payer: Self-pay | Admitting: Psychiatry

## 2020-03-12 DIAGNOSIS — F319 Bipolar disorder, unspecified: Secondary | ICD-10-CM

## 2020-03-12 DIAGNOSIS — F39 Unspecified mood [affective] disorder: Secondary | ICD-10-CM

## 2020-03-12 NOTE — Telephone Encounter (Signed)
Kimberly Ray called to report that since the increase of the lamictal she is experiencing a lot of itching on her head and other areas of the body.  Can't see any rash but feels she may have some on her upper back but she can't see that area.  Please call with advice.

## 2020-03-13 ENCOUNTER — Other Ambulatory Visit: Payer: Self-pay

## 2020-03-13 ENCOUNTER — Ambulatory Visit (INDEPENDENT_AMBULATORY_CARE_PROVIDER_SITE_OTHER): Payer: Medicare HMO | Admitting: Adult Health

## 2020-03-13 ENCOUNTER — Encounter: Payer: Self-pay | Admitting: Adult Health

## 2020-03-13 VITALS — BP 118/74 | HR 94 | Temp 96.1°F | Ht 67.0 in | Wt 183.8 lb

## 2020-03-13 DIAGNOSIS — L908 Other atrophic disorders of skin: Secondary | ICD-10-CM

## 2020-03-13 DIAGNOSIS — Z1283 Encounter for screening for malignant neoplasm of skin: Secondary | ICD-10-CM | POA: Diagnosis not present

## 2020-03-13 NOTE — Telephone Encounter (Signed)
Patient aware and will call back with worsening or continuing symptoms.

## 2020-03-13 NOTE — Progress Notes (Signed)
Assessment and Plan:  Vestal was seen today for skin problem.  Diagnoses and all orders for this visit:  Skin aging Skin cancer screening She points out normal subtle skin irregularities as expected for age without distinct rash, skin lesions. Discussed with her that I do not see any identifiable problems, rash, concerning lesions She is somewhat reassured by my exam; discussed it would be reasonable to present to derm for routine full body skin check as she is fairly anxious Discussed skin protection; skin cancer prevention and routine screening I wondered if there may be an element of visual hallucination or obsessive thought process; she has severe depression with recent visual and olfactory hallucinations with unremarkable neuro workup, has had meds adjusted by psych last week Advised if obsessive thoughts about skin after seeing derm to follow up with psych provider; she is in agreement with the above assessment and appreciative of my time.   Further disposition pending results of labs. Discussed med's effects and SE's.   Over 15 minutes of exam, counseling, chart review, and critical decision making was performed.   Future Appointments  Date Time Provider Kimberly Ray  03/29/2020 11:00 AM Thayer Headings, PMHNP CP-CP None  04/11/2020 10:00 AM Liane Comber, NP [facility] None  06/27/2020 10:15 AM [name], NP [facility] None  07/10/2020 12:30 PM [facility] [name] [facility]    ------------------------------------------------------------------------------------------------------------------   HPI BP 118/74   Pulse 94   Temp (!) 96.1 F (35.6 C)   Ht 5\' 7"  (1.702 m)   Wt 183 lb 12.8 oz (83.4 kg)   SpO2 96%   BMI 28.79 kg/m   69 y.o.female presents for evaluation of skin concerns. She reports has been spending more time out at the pool and has noted subtle changes and discoloration in skin, denies itching or notable rash. Has noted itching of scalp since last  week. She denies hx of skin cancer. Has seen derm previously but not in recent years. She does wear sunscreen or protective shirt and hat if will be outdoors.   Notably with hx of severe depression, memory changes, recent visual and olfactory hallucinations, saw neuro Dr. Jannifer Franklin with unremarkable workup including brain MRI, now following with new provider Thayer Headings, NP at [facility], reports has recently stopped Seroquel and increased lamictal.   Past Medical History:  Diagnosis Date  . Anemia   . Depression   . Extrinsic asthma 11/09/2014  . Gait abnormality 12/26/2019  . GERD (gastroesophageal reflux disease)   . Hay fever   . Head trauma   . IBS (irritable bowel syndrome)   . Memory loss   . Migraines   . OSA on CPAP   . Reflux   . RLS (restless legs syndrome)   . Sinus congestion      Allergies  Allergen Reactions  . Erythromycin Other (See Comments)    TBS  . Iodinated Diagnostic Agents Other (See Comments)    Hot flashes, itching scalp Hot flashes, itching scalp  . Penicillins Hives  . Clarithromycin Other (See Comments)    Itching ankles  . Brintellix [Vortioxetine]     Itching    Current Outpatient Medications on File Prior to Visit  Medication Sig  . acetaminophen (TYLENOL) 650 MG CR tablet Take 650 mg by mouth every 8 (eight) hours as needed for pain.  Marland Kitchen buPROPion (WELLBUTRIN XL) 300 MG 24 hr tablet TAKE ONE TABLET DAILY FOR MOOD  . cholecalciferol (VITAMIN D3) 25 MCG (1000 UNIT) tablet Take 1,000  Units by mouth daily.  . Coenzyme Q10 (CO Q 10 PO) Take by mouth.  . DULoxetine (CYMBALTA) 30 MG capsule Take 1 capsule Daily for Mood  . ezetimibe (ZETIA) 10 MG tablet Take 1 tablet Daily for Cholesterol  . famotidine (PEPCID) 20 MG tablet TAKE ONE TABLET TWICE A DAY FOR HEARTBURN AND INDIGESTION  . fenofibrate (TRICOR) 145 MG tablet Take 1 tablet Daily for Triglycerides (Blood Fats)  . lamoTRIgine (LAMICTAL) 100 MG tablet Take 1 tablet Daily to Stabilize Mood  (Patient taking differently: 100 mg daily. Take 1 tablet Daily to Stabilize Mood)  . Omega-3 1000 MG CAPS Take by mouth.  . ondansetron (ZOFRAN ODT) 8 MG disintegrating tablet Take 1 tablet (8 mg total) by mouth every 8 (eight) hours as needed for nausea or vomiting.  Marland Kitchen rOPINIRole (REQUIP) 1 MG tablet TAKE THREE TABLETS  EVERY NIGHT AT BEDTIME FOR RESTLESS LEGS  . SUMAtriptan (IMITREX) 25 MG tablet Take 1-2 tab with onset of migraine. May repeat in 2 hours if headache persists. Max 4 tabs in 24 hours. (Patient taking differently: Take 25-50 mg by mouth See admin instructions. Take 1-2 tab with onset of migraine. May repeat in 2 hours if headache persists. Max 4 tabs in 24 hours.)   No current facility-administered medications on file prior to visit.    ROS: all negative except above.   Physical Exam:  BP 118/74   Pulse 94   Temp (!) 96.1 F (35.6 C)   Ht 5\' 7"  (1.702 m)   Wt 183 lb 12.8 oz (83.4 kg)   SpO2 96%   BMI 28.79 kg/m   General Appearance: Well nourished, in no apparent distress. Eyes: PERRLA, EOMs, conjunctiva no swelling or erythema ENT/Mouth: Ext aud canals clear, TMs without erythema, bulging. No erythema, swelling, or exudate on post pharynx.  Tonsils not swollen or erythematous. Hearing normal.  Neck: Supple Respiratory: Respiratory effort normal Cardio: RRR with no MRGs. Brisk peripheral pulses without edema.  Lymphatics: Non tender without lymphadenopathy.  Musculoskeletal: normal gait.  Skin: Warm, dry; there are NO identifiable specific lesions, rash, eechymosis; she has typical for age skin discoloration/irregularities, benign appearing nevi, no concerning freatures. Scalp is intact without erythema, flaking, rash or lesion.  Neuro: Cranial nerves intact. Normal muscle tone, no cerebellar symptoms. Sensation intact.  Psych: Awake and oriented X 3, mildly anxious affect, Insight and Judgment appropriate.     Kimberly Ribas, NP 2:59 PM [facility]

## 2020-03-13 NOTE — Patient Instructions (Signed)
Schedule total body skin check   If normal but skin is still bothering you a lot, may need to discuss with Janett Billow at [facility], Adult Skin cancer is the most common type of cancer. There are three main types. Squamous cell and basal cell skin cancer are the most common. Melanoma skin cancer is the most dangerous type. Most skin cancers are caused by skin damage from exposure to ultraviolet (UV) light. UV light comes from the sun and from artificial tanning beds. Suntans and sunburns result from exposure to UV light. Skin cancer occurs most often in older people, but it is usually the result of damage done earlier in life. The tans and sunburns you get at any age can lead to skin cancer in the future. To help prevent this, you can take steps to protect yourself. What actions can I take to protect myself from skin cancer? Many people like to get a tan, especially in the summer or when on vacation. However, tan or burned skin is a sign of skin damage. It increases your risk for skin cancer. To lower your risk: Avoid exposure to UV light   Try to stay out of the sun between 10 a.m. and 4 p.m. whenever possible. This is when the sun is at its strongest. Seek the shade during this time.  Remember that you can also be exposed to UV rays on cloudy or hazy days. Nancy Fetter exposure can be risky year-round, not just in the summer.  Do not use a sunlamp, tanning bed, or tanning booth to get a tan. If you really want a tan, use an artificial tanning lotion.  Avoid getting sunburned. Sunburns are more common on bright sunny days, especially when you are in areas where the sun is reflected off water or snow. Use sunscreen and protective clothing   Always use sunscreen--either a cream, lotion, or spray--when you are out in the sun. Keep sunscreen handy, such as in your gym bag or in your car, so that you will have it when you need it.  Use a sunscreen with a sun protection factor  (SPF) of at least 25. Use an SPF of 30 or higher if you are in bright sun, especially when you are out in the snow or on the water.  Make sure your sunscreen protects you from UVA and UVB light.  Use an adequate amount of sunscreen to cover exposed areas of skin. Put it on 30 minutes before you go out. Reapply it every 2 hours or anytime you come out of the water.  When you are out in the sun, wear a broad-brimmed hat and clothing that covers your arms and legs. Wear wraparound sunglasses. Check your skin for changes  Check your skin often from head to toe to look for any changes in the size, color, or shape of any moles or freckles. Check for any new moles or moles that bleed or become itchy. See your health care provider if you notice changes.  Ask your health care provider about a total skin check. Ask if it should be part of your yearly physical or if you need to see a skin specialist (dermatologist). Take other preventive measures   Avoid exposure to harmful chemicals, such as arsenic. ? Have your home's water tested for arsenic and other chemicals. ? Take protective measures to avoid exposure to chemicals at work.  Do not smoke any tobacco products, such as cigarettes, cigars, pipes, and e-cigarettes. If you  need help quitting, ask your health care provider.  Keep your immune system healthy. ? Stay up to date on all vaccines, including the human papillomavirus (HPV) vaccine. ? Eat at least 5 servings of fruits and vegetables every day. Why are these changes important? About 1 of every 5 people will get skin cancer. The best way to reduce your risk is to avoid skin damage from UV light. If you have teenagers in your house, they should know that just five bad sunburns as a teen could double their risk of skin cancer in the future. If you have younger children, always make sure to protect their skin from the sun. These changes can help reduce your risk of skin cancer, and they will also  provide other health benefits, such as the following:  Protecting your skin from the sun can help prevent painful sunburns, sun poisoning, and other skin damage and blemishes. This is especially important if: ? You have pale white skin, freckles, and red hair. ? You burn easily.  Avoiding exposure to harmful chemicals can help prevent damage to other tissues in your body, such as your lungs, and prevent other types of cancer.  Avoiding smoking tobacco can reduce your risk for other types of cancer and other health problems.  Eating a healthy diet is good for your overall health. What can happen if changes are not made? If you do not make these changes, you will be at higher risk for skin cancer. If you develop skin cancer, the treatments could result in lost time from work and changes in your appearance from scars. The most dangerous type of skin cancer, melanoma, can be deadly if not found early. Where to find support For more support, talk to your primary health care provider or dermatologist. Where to find more information Learn more about skin cancer from:  The [place]: www.skincancer.org/prevention  The Centers for Disease Control and Prevention: FabVets.se  The American Academy of Dermatology: http://jones-macias.info/ Summary  Skin cancer is the most common type of cancer.  Melanoma skin cancer can be deadly if not found early.  Sunburns and tanning increase your risk for skin cancer.  Protecting your skin from UV light is the best way to prevent skin cancer. This information is not intended to replace advice given to you by your health care provider. Make sure you discuss any questions you have with your health care provider. Document Revised: 11/26/2018 Document Reviewed: 09/28/2017 Elsevier Patient Education  [place].

## 2020-03-29 ENCOUNTER — Encounter: Payer: Self-pay | Admitting: Psychiatry

## 2020-03-29 ENCOUNTER — Ambulatory Visit (INDEPENDENT_AMBULATORY_CARE_PROVIDER_SITE_OTHER): Payer: Medicare HMO | Admitting: Psychiatry

## 2020-03-29 ENCOUNTER — Other Ambulatory Visit: Payer: Self-pay

## 2020-03-29 DIAGNOSIS — F39 Unspecified mood [affective] disorder: Secondary | ICD-10-CM

## 2020-03-29 MED ORDER — BUPROPION HCL ER (XL) 300 MG PO TB24: ORAL_TABLET | ORAL | 0 refills | Status: DC

## 2020-03-29 MED ORDER — DULOXETINE HCL 30 MG PO CPEP: ORAL_CAPSULE | ORAL | 0 refills | Status: DC
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
## 2020-04-08 ENCOUNTER — Encounter: Payer: Self-pay | Admitting: Adult Health

## 2020-04-08 DIAGNOSIS — N9489 Other specified conditions associated with female genital organs and menstrual cycle: Secondary | ICD-10-CM

## 2020-04-08 HISTORY — DX: Other specified conditions associated with female genital organs and menstrual cycle: N94.89

## 2020-04-08 NOTE — Progress Notes (Signed)
Complete Physical  Assessment and Plan:   Encounter for general adult medical examination with abnormal findings  Hyperlipidemia -cont diet and exercise - Lipid panel   Hyperglycemia Discussed general issues about diabetes pathophysiology and management., Educational material distributed., Suggested low cholesterol diet., Encouraged aerobic exercise., Discussed foot care., Reminded to get yearly retinal exam.  Anemia, unspecified - monitor, continue iron supp with Vitamin C and increase green leafy veggies - Iron and TIBC, CBC - Vitamin B12 has been normal - patient request to repeat check today    Essential hypertension - continue medications, DASH diet, exercise and monitor at home. Call if greater than 130/80.  - Urinalysis, Routine w reflex microscopic (not at [facility]) - Microalbumin / creatinine urine ratio - TSH   Vitamin D deficiency - VITAMIN D 25 Hydroxy (Vit-D Deficiency, Fractures)  Osteopenia - get dexa q2y, due 2022, continue Vit D and Ca, weight bearing exercises  ADD (attention deficit disorder) Retired; now on wellbutrin only; follows with psych Helps with focus, no AE's. The patient was counseled on the addictive nature of the medication and was encouraged to take drug holidays when not needed.   CKD 3 Increase fluids, avoid NSAIDS, monitor sugars, will monitor  Fatty liver Weight loss advised, avoid alcohol/tylenol, will monitor LFTs  OSA on CPAP Endorses 100 % compliance   Extrinsic asthma without complication, unspecified asthma severity, unspecified whether persistent Avoid triggers  Gastroesophageal reflux disease, esophagitis presence not specified Controlled by lifestyle and H2i Discussed diet, avoiding triggers and other lifestyle changes  RLS (restless legs syndrome) Monitor iron, magnesium, increase walking Follow up neuro PRN; consider gabapentin if needed per Dr. Jannifer Franklin  Hallucinations Negative MRI, Dr. Jannifer Franklin working up, ? R/t severe  depression or med SE Patient reports recently improved  MAJOR depression, recurrent, chronic ([place]) Managed by psych;  Recently on lamictal, cymbalta, wellbutrin with poor control at this time; encouraged to schedule follow up to discuss Lifestyle discussed: diet/exerise, sleep hygiene, stress management, hydration  Overweight Long discussion about weight loss, diet, and exercise Recommended diet heavy in fruits and veggies and low in animal meats, cheeses, and dairy products, appropriate calorie intake Discussed appropriate weight for height  Follow up at next visit  Orders Placed This Encounter  Procedures  . DG Bone Density  . CBC with Differential/Platelet  . COMPLETE METABOLIC PANEL WITH GFR  . Magnesium  . Lipid panel  . TSH  . Hemoglobin A1c  . VITAMIN D 25 Hydroxy (Vit-D Deficiency, Fractures)  . Microalbumin / creatinine urine ratio  . Urinalysis, Routine w reflex microscopic  . Iron  . Ferritin  . Vitamin B12  . EKG 12-Lead      Discussed med's effects and SE's. Screening labs and tests as requested with regular follow-up as recommended. Future Appointments  Date Time Provider [place]  06/27/2020 10:15 AM [name], NP GNA-GNA None  06/29/2020 11:00 AM Thayer Headings, PMHNP CP-CP None  07/10/2020 12:30 PM GI[facility] [name] 1 GI-BCGUS GI-BREAST CE  07/24/2020  9:30 AM Unk Pinto, MD GAAM-GAAIM None  10/26/2020  9:30 AM Liane Comber, NP GAAM[facility]GAA[facility] None  04/15/2021 10:00 AM Liane Comber, NP GAAM-GAAIM None     HPI  69 y.o. female  presents for a complete physical. She has Mixed hyperlipidemia; ADD (attention deficit disorder); OSA on CPAP; Major depression, recurrent, chronic ([place]); Other abnormal glucose (prediabetes); Vitamin D deficiency; GERD ; Medication management; Overweight (BMI 25.0-29.9); Circadian rhythm sleep disorder; Periodic limb movement disorder (PLMD); Fatty liver; Osteopenia; CKD (chronic  kidney disease) stage 3, GFR 30-59  ml/min; Memory changes; Gait abnormality; RLS (restless legs syndrome); and Hallucinations (visual and olfactory) on their problem list.   She retired from job as Catering manager in 2019.   In 2019 she had TAH due to she experienced self-limiting uterine bleeding, workup by GYN demonstrated an endometrial stripe of 4 mm, an ultrasound that showed a 10 mm irregular cyst and laparoscopic findings notable for an adnexa with excrescences.  CA125 was 6. Was released without further follow up recommended.   She has long hx of severe depression; worse since retirement and with covid 19 despite numerous medications, with memory changes, visual/olfactory hallucinations (Saw Dr. Jannifer Franklin in 12/2019 with unremarkable labs and MRI, reports currently sx are mild and rare) and now follows with psych Thayer Headings, NP. Currently taking Lamictal 100 mg, cymbalta 30 mg daily wellbutrin XL 300 mg daily, holding off of med adjustment pending hallucination workup, patient reports depression remains severe, debilitating, plans to follow up.   She has RLS symptoms, currently fairly managed by requip 1-2 mg nightly, improves with exercise; saw Dr. Brett Fairy for OSA recommended CPAP, endorses 100% compliance.   She is on famotidine for GERD.   BMI is Body mass index is 29.96 kg/m., she has been working on diet and exercise, will get on stationary cycle or swimming when she can motivate herself, does perceive benefit.  Wt Readings from Last 3 Encounters:  04/11/20 187 lb (84.8 kg)  03/13/20 183 lb 12.8 oz (83.4 kg)  01/03/20 188 lb (85.3 kg)   Her blood pressure has been controlled at home, today their BP is BP: 110/74.   She does workout. She denies chest pain, shortness of breath, dizziness.   She is on cholesterol medication (fenofibrate) and denies myalgias. She was on pravastatin previously and reported arthralgias that resolved with cessation.  Her cholesterol is not at goal. The cholesterol last visit was:  Lab  Results  Component Value Date   CHOL 190 10/27/2019   HDL 43 (L) 10/27/2019   LDLCALC 104 (H) 10/27/2019   TRIG 321 (H) 10/27/2019   CHOLHDL 4.4 10/27/2019  . She has been working on diet and exercise for glucose management, has had prDM for several years, first elevation into diabetic range 10/27/2019 at 6.6%.  She denies polyuria, polydipsia, vision changes.  Last A1C in the office was:  Lab Results  Component Value Date   HGBA1C 6.6 (H) 10/27/2019    She has CKD IIIa monitored at this office:  Lab Results  Component Value Date   GFRNONAA 51 (L) 12/26/2019   Patient is on Vitamin D supplement, taking 2000 IU daily   Lab Results  Component Value Date   VD25OH 40 10/27/2019       Current Medications:  Current Outpatient Medications on File Prior to Visit  Medication Sig  . acetaminophen (TYLENOL) 650 MG CR tablet Take 650 mg by mouth every 8 (eight) hours as needed for pain.  . Ascorbic Acid (VITAMIN C) 100 MG tablet Take 100 mg by mouth daily.  Marland Kitchen buPROPion (WELLBUTRIN XL) 300 MG 24 hr tablet TAKE ONE TABLET DAILY FOR MOOD  . cholecalciferol (VITAMIN D3) 25 MCG (1000 UNIT) tablet Take 1,000 Units by mouth daily.  . Coenzyme Q10 (CO Q 10 PO) Take by mouth.  . DULoxetine (CYMBALTA) 30 MG capsule Take 1 capsule Daily for Mood  . ezetimibe (ZETIA) 10 MG tablet Take 1 tablet Daily for Cholesterol  . famotidine (PEPCID) 20 MG tablet  TAKE ONE TABLET TWICE A DAY FOR HEARTBURN AND INDIGESTION  . fenofibrate (TRICOR) 145 MG tablet Take 1 tablet Daily for Triglycerides (Blood Fats)  . lamoTRIgine (LAMICTAL) 100 MG tablet Take 1 tablet Daily to Stabilize Mood (Patient taking differently: 100 mg daily. Take 1 tablet Daily to Stabilize Mood)  . Omega-3 1000 MG CAPS Take by mouth.  . ondansetron (ZOFRAN ODT) 8 MG disintegrating tablet Take 1 tablet (8 mg total) by mouth every 8 (eight) hours as needed for nausea or vomiting.  Marland Kitchen rOPINIRole (REQUIP) 1 MG tablet TAKE THREE TABLETS  EVERY NIGHT  AT BEDTIME FOR RESTLESS LEGS  . SUMAtriptan (IMITREX) 25 MG tablet Take 1-2 tab with onset of migraine. May repeat in 2 hours if headache persists. Max 4 tabs in 24 hours. (Patient taking differently: Take 25-50 mg by mouth See admin instructions. Take 1-2 tab with onset of migraine. May repeat in 2 hours if headache persists. Max 4 tabs in 24 hours.)   No current facility-administered medications on file prior to visit.    Health Maintenance:   Immunization History  Administered Date(s) Administered  . Influenza, High Dose Seasonal PF 06/12/2016, 05/13/2017  . Influenza-Unspecified 06/02/2015, 05/19/2018, 05/19/2019  . PFIZER SARS-COV-2 Vaccination 09/12/2019, 10/03/2019  . PPD Test 08/19/2011, 08/30/2013, 11/09/2014  . Pneumococcal Conjugate-13 08/18/2009, 02/18/2019  . Pneumococcal Polysaccharide-23 06/12/2016  . Td 08/19/2003  . Tdap 08/30/2013  . Zoster 08/18/2010    Tetanus: 2015 Pneumovax: 2017 Prevnar: 2011, 2020 Flu vaccine: 2020 Zostavax: 2012 Covid 19: 2/2, 2021, pfizer  PAP: 2019, s/p TAH, done  MGM: 12/2019 R breast likely cyst - 6 month follow up US recommended and scheduled in 06/2020 DEXA: 08/2018 - L fem T -2.0  - ordered to have with next mammogram  Colonoscopy: 09/2011 EGD: 2019  Last Dental Exam: Dr. Tama Gander, last 2020 but out of network, working with insurance Last Eye Exam: Dr. Trish Fountain office, yearly Last Derm Exam: few years ago  Patient Care Team: Unk Pinto, MD as PCP - General (Internal Medicine) Clarene Essex, MD as Consulting Physician (Gastroenterology) Royston Sinner Colin Benton, MD as Consulting Physician (Obstetrics and Gynecology)   Medical History:  Past Medical History:  Diagnosis Date  . Adnexal mass 04/08/2020  . Anemia   . Depression   . Extrinsic asthma 11/09/2014  . Gait abnormality 12/26/2019  . GERD (gastroesophageal reflux disease)   . Hay fever   . Head trauma   . IBS (irritable bowel syndrome)   . Memory loss   .  Migraines   . OSA on CPAP   . Reflux   . RLS (restless legs syndrome)   . Sinus congestion     Allergies Allergies  Allergen Reactions  . Erythromycin Other (See Comments)    TBS  . Iodinated Diagnostic Agents Other (See Comments)    Hot flashes, itching scalp Hot flashes, itching scalp  . Penicillins Hives  . Clarithromycin Other (See Comments)    Itching ankles  . Brintellix [Vortioxetine]     Itching    SURGICAL HISTORY She  has a past surgical history that includes perforated eardrum; Nasal sinus surgery; Tonsillectomy and adenoidectomy; Breast biopsy (Right, 02/27/2014); Cholecystectomy (11/03/2017); Laparoscopic hysterectomy (12/2017); Ureteroscopy; ORIF ankle fracture bimalleolar (Left, 03/14/2019); and Abdominal hysterectomy. FAMILY HISTORY Her family history includes Anxiety disorder in her brother, father, and sister; Bipolar disorder in her brother; Breast cancer in her cousin; Breast cancer (age of onset: 27) in her maternal aunt; Breast cancer (age of onset: 27) in her  maternal grandmother; Colon cancer (age of onset: 73) in her paternal grandmother; Dementia in her father; Depression in her sister; Diabetes in her mother and sister; Heart failure in her father; Heart failure (age of onset: 58) in her maternal grandfather; Mental illness in her paternal grandmother; Mood Disorder in her father; Osteoporosis in her brother; Stomach cancer in her maternal aunt. SOCIAL HISTORY She  reports that she has never smoked. She has never used smokeless tobacco. She reports current alcohol use of about 1.0 standard drink of alcohol per week. She reports that she does not use drugs.  Review of Systems: Review of Systems  Constitutional: Negative for chills, fever, malaise/fatigue and weight loss.  HENT: Negative for congestion, ear pain, hearing loss, sore throat and tinnitus.   Eyes: Negative for blurred vision and double vision.  Respiratory: Negative for cough, sputum production,  shortness of breath and wheezing.   Cardiovascular: Negative for chest pain, palpitations, orthopnea, claudication, leg swelling and PND.  Gastrointestinal: Negative for abdominal pain, blood in stool, constipation, diarrhea, heartburn, melena, nausea and vomiting.  Genitourinary: Negative.   Musculoskeletal: Negative for falls, joint pain and myalgias.  Skin: Negative for rash.  Neurological: Negative for dizziness, tingling, sensory change, loss of consciousness, weakness and headaches (occasional migraines).  Endo/Heme/Allergies: Negative for polydipsia.  Psychiatric/Behavioral: Positive for depression and hallucinations. Negative for memory loss, substance abuse and suicidal ideas. The patient is not nervous/anxious and does not have insomnia.   All other systems reviewed and are negative.   Physical Exam: Estimated body mass index is 29.96 kg/m as calculated from the following:   Height as of this encounter: 5' 6.25" (1.683 m).   Weight as of this encounter: 187 lb (84.8 kg). BP 110/74   Pulse 75   Temp (!) 97.5 F (36.4 C)   Ht 5' 6.25" (1.683 m)   Wt 187 lb (84.8 kg)   SpO2 99%   BMI 29.96 kg/m   Wt Readings from Last 3 Encounters:  04/11/20 187 lb (84.8 kg)  03/13/20 183 lb 12.8 oz (83.4 kg)  01/03/20 188 lb (85.3 kg)    General Appearance: Well nourished well developed, in no apparent distress.  Eyes: PERRLA, EOMs, conjunctiva no swelling or erythema ENT/Mouth: Ear canals normal without obstruction, swelling, erythema, or discharge.  TMs normal bilaterally with no erythema, bulging, retraction, or loss of landmark.  Oropharynx moist and clear with no exudate, erythema, or swelling.   Neck: Supple, thyroid normal. No bruits.  No cervical adenopathy Respiratory: Respiratory effort normal, Breath sounds clear A&P without wheeze, rhonchi, rales.   Cardio: RRR without murmurs, rubs or gallops. Brisk peripheral pulses without edema.  Chest: symmetric, with normal  excursions Breasts: breasts appear normal, no suspicious masses, no skin or nipple changes or axillary nodes. Abdomen: Soft, nontender, no guarding, rebound, hernias, masses, or organomegaly.  Lymphatics: Non tender without lymphadenopathy.   Musculoskeletal: No obvious deformity; full ROM in extremities without effusion; slow mildly unsteady gait.  Skin: Warm, dry without rashes, lesions, ecchymosis. GU: no concerns, s/p hysterectomy, defer Neuro: Awake and oriented X 3, Cranial nerves intact, reflexes equal bilaterally upper extremities; Normal muscle tone, no cerebellar symptoms. Sensation intact.  Psych:  Depressed affect, non-pressured speech, Insight and Judgment appropriate.  EKG: NSR, NSCPT  Over 40 minutes of exam, counseling, chart review and critical decision making was performed  Izora Ribas 12:24 PM [facility]

## 2020-04-11 ENCOUNTER — Encounter: Payer: Self-pay | Admitting: Adult Health

## 2020-04-11 ENCOUNTER — Other Ambulatory Visit: Payer: Self-pay

## 2020-04-11 ENCOUNTER — Ambulatory Visit (INDEPENDENT_AMBULATORY_CARE_PROVIDER_SITE_OTHER): Payer: Medicare HMO | Admitting: Adult Health

## 2020-04-11 VITALS — BP 110/74 | HR 75 | Temp 97.5°F | Ht 66.25 in | Wt 187.0 lb

## 2020-04-11 DIAGNOSIS — Z0001 Encounter for general adult medical examination with abnormal findings: Secondary | ICD-10-CM

## 2020-04-11 DIAGNOSIS — G4720 Circadian rhythm sleep disorder, unspecified type: Secondary | ICD-10-CM

## 2020-04-11 DIAGNOSIS — Z130 Encounter for screening for diseases of the blood and blood-forming organs and certain disorders involving the immune mechanism: Secondary | ICD-10-CM

## 2020-04-11 DIAGNOSIS — E559 Vitamin D deficiency, unspecified: Secondary | ICD-10-CM | POA: Diagnosis not present

## 2020-04-11 DIAGNOSIS — Z1389 Encounter for screening for other disorder: Secondary | ICD-10-CM

## 2020-04-11 DIAGNOSIS — R413 Other amnesia: Secondary | ICD-10-CM

## 2020-04-11 DIAGNOSIS — Z136 Encounter for screening for cardiovascular disorders: Secondary | ICD-10-CM | POA: Diagnosis not present

## 2020-04-11 DIAGNOSIS — F988 Other specified behavioral and emotional disorders with onset usually occurring in childhood and adolescence: Secondary | ICD-10-CM

## 2020-04-11 DIAGNOSIS — D649 Anemia, unspecified: Secondary | ICD-10-CM | POA: Diagnosis not present

## 2020-04-11 DIAGNOSIS — M8580 Other specified disorders of bone density and structure, unspecified site: Secondary | ICD-10-CM

## 2020-04-11 DIAGNOSIS — Z131 Encounter for screening for diabetes mellitus: Secondary | ICD-10-CM

## 2020-04-11 DIAGNOSIS — G2581 Restless legs syndrome: Secondary | ICD-10-CM

## 2020-04-11 DIAGNOSIS — K219 Gastro-esophageal reflux disease without esophagitis: Secondary | ICD-10-CM

## 2020-04-11 DIAGNOSIS — K760 Fatty (change of) liver, not elsewhere classified: Secondary | ICD-10-CM

## 2020-04-11 DIAGNOSIS — N1831 Chronic kidney disease, stage 3a: Secondary | ICD-10-CM

## 2020-04-11 DIAGNOSIS — E782 Mixed hyperlipidemia: Secondary | ICD-10-CM | POA: Diagnosis not present

## 2020-04-11 DIAGNOSIS — R269 Unspecified abnormalities of gait and mobility: Secondary | ICD-10-CM

## 2020-04-11 DIAGNOSIS — R7309 Other abnormal glucose: Secondary | ICD-10-CM

## 2020-04-11 DIAGNOSIS — R030 Elevated blood-pressure reading, without diagnosis of hypertension: Secondary | ICD-10-CM

## 2020-04-11 DIAGNOSIS — F339 Major depressive disorder, recurrent, unspecified: Secondary | ICD-10-CM

## 2020-04-11 DIAGNOSIS — Z0000 Encounter for general adult medical examination without abnormal findings: Secondary | ICD-10-CM

## 2020-04-11 DIAGNOSIS — E663 Overweight: Secondary | ICD-10-CM

## 2020-04-11 DIAGNOSIS — G4733 Obstructive sleep apnea (adult) (pediatric): Secondary | ICD-10-CM

## 2020-04-11 DIAGNOSIS — G4761 Periodic limb movement disorder: Secondary | ICD-10-CM

## 2020-04-11 DIAGNOSIS — Z79899 Other long term (current) drug therapy: Secondary | ICD-10-CM | POA: Diagnosis not present

## 2020-04-11 DIAGNOSIS — R443 Hallucinations, unspecified: Secondary | ICD-10-CM

## 2020-04-11 NOTE — Patient Instructions (Addendum)
Kimberly Ray , Thank you for taking time to come for your Medicare Wellness Visit. I appreciate your ongoing commitment to your health goals. Please review the following plan we discussed and let me know if I can assist you in the future.   These are the goals we discussed: Goals    . HEMOGLOBIN A1C < 5.7    . LDL CALC < 100    . Weight (lb) < 160 lb (72.6 kg)       This is a list of the screening recommended for you and due dates:  Health Maintenance  Topic Date Due  . Flu Shot  03/18/2020  . Urine Protein Check  04/11/2020  . Colon Cancer Screening  10/12/2021  . Mammogram  01/01/2022  . Tetanus Vaccine  08/31/2023  . DEXA scan (bone density measurement)  Completed  . COVID-19 Vaccine  Completed  .  Hepatitis C: One time screening is recommended by Center for Disease Control  (CDC) for  adults born from [birth year] through 1966.   Completed  . Pneumonia vaccines  Completed           Know what a healthy weight is for you (roughly BMI <25) and aim to maintain this  Aim for 7+ servings of fruits and vegetables daily  65-80+ fluid ounces of water or unsweet tea for healthy kidneys  Limit to max 1 drink of alcohol per day; avoid smoking/tobacco  Limit animal fats in diet for cholesterol and heart health - choose grass fed whenever available  Avoid highly processed foods, and foods high in saturated/trans fats  Aim for low stress - take time to unwind and care for your mental health  Aim for 150 min of moderate intensity exercise weekly for heart health, and weights twice weekly for bone health  Aim for 7-9 hours of sleep daily  High-Fiber Diet Fiber, also called dietary fiber, is a type of carbohydrate that is found in fruits, vegetables, whole grains, and beans. A high-fiber diet can have many health benefits. Your health care provider may recommend a high-fiber diet to help:  Prevent constipation. Fiber can make your bowel movements more regular.  Lower your  cholesterol.  Relieve the following conditions: ? Swelling of veins in the anus (hemorrhoids). ? Swelling and irritation (inflammation) of specific areas of the digestive tract (uncomplicated diverticulosis). ? A problem of the large intestine (colon) that sometimes causes pain and diarrhea (irritable bowel syndrome, IBS).  Prevent overeating as part of a weight-loss plan.  Prevent heart disease, type 2 diabetes, and certain cancers. What is my plan? The recommended daily fiber intake in grams (g) includes:  38 g for men age 47 or younger.  30 g for men over age 61.  17 g for women age 71 or younger.  21 g for women over age 66. You can get the recommended daily intake of dietary fiber by:  Eating a variety of fruits, vegetables, grains, and beans.  Taking a fiber supplement, if it is not possible to get enough fiber through your diet. What do I need to know about a high-fiber diet?  It is better to get fiber through food sources rather than from fiber supplements. There is not a lot of research about how effective supplements are.  Always check the fiber content on the nutrition facts label of any prepackaged food. Look for foods that contain 5 g of fiber or more per serving.  Talk with a diet and nutrition specialist (dietitian) if you  have questions about specific foods that are recommended or not recommended for your medical condition, especially if those foods are not listed below.  Gradually increase how much fiber you consume. If you increase your intake of dietary fiber too quickly, you may have bloating, cramping, or gas.  Drink plenty of water. Water helps you to digest fiber. What are tips for following this plan?  Eat a wide variety of high-fiber foods.  Make sure that half of the grains that you eat each day are whole grains.  Eat breads and cereals that are made with whole-grain flour instead of refined flour or white flour.  Eat brown rice, bulgur wheat, or  millet instead of white rice.  Start the day with a breakfast that is high in fiber, such as a cereal that contains 5 g of fiber or more per serving.  Use beans in place of meat in soups, salads, and pasta dishes.  Eat high-fiber snacks, such as berries, raw vegetables, nuts, and popcorn.  Choose whole fruits and vegetables instead of processed forms like juice or sauce. What foods can I eat?  Fruits Berries. Pears. Apples. Oranges. Avocado. Prunes and raisins. Dried figs. Vegetables Sweet potatoes. Spinach. Kale. Artichokes. Cabbage. Broccoli. Cauliflower. Green peas. Carrots. Squash. Grains Whole-grain breads. Multigrain cereal. Oats and oatmeal. Brown rice. Barley. Bulgur wheat. [place]. Quinoa. Bran muffins. Popcorn. Rye wafer crackers. Meats and other proteins Navy, kidney, and pinto beans. Soybeans. Split peas. Lentils. Nuts and seeds. Dairy Fiber-fortified yogurt. Beverages Fiber-fortified soy milk. Fiber-fortified orange juice. Other foods Fiber bars. The items listed above may not be a complete list of recommended foods and beverages. Contact a dietitian for more options. What foods are not recommended? Fruits Fruit juice. Cooked, strained fruit. Vegetables Fried potatoes. Canned vegetables. Well-cooked vegetables. Grains White bread. Pasta made with refined flour. White rice. Meats and other proteins Fatty cuts of meat. Fried chicken or fried fish. Dairy Milk. Yogurt. Cream cheese. Sour cream. Fats and oils Butters. Beverages Soft drinks. Other foods Cakes and pastries. The items listed above may not be a complete list of foods and beverages to avoid. Contact a dietitian for more information. Summary  Fiber is a type of carbohydrate. It is found in fruits, vegetables, whole grains, and beans.  There are many health benefits of eating a high-fiber diet, such as preventing constipation, lowering blood cholesterol, helping with weight loss, and reducing your risk  of heart disease, diabetes, and certain cancers.  Gradually increase your intake of fiber. Increasing too fast can result in cramping, bloating, and gas. Drink plenty of water while you increase your fiber.  The best sources of fiber include whole fruits and vegetables, whole grains, nuts, seeds, and beans. This information is not intended to replace advice given to you by your health care provider. Make sure you discuss any questions you have with your health care provider. Document Revised: 06/08/2017 Document Reviewed: 06/08/2017 Elsevier Patient Education  2020 [employer].

## 2020-04-12 ENCOUNTER — Encounter: Payer: Self-pay | Admitting: Adult Health

## 2020-04-12 DIAGNOSIS — E538 Deficiency of other specified B group vitamins: Secondary | ICD-10-CM | POA: Insufficient documentation

## 2020-04-12 LAB — URINALYSIS, ROUTINE W REFLEX MICROSCOPIC
Bacteria, UA: NONE SEEN /HPF
Bilirubin Urine: NEGATIVE
Glucose, UA: NEGATIVE
Hgb urine dipstick: NEGATIVE
Hyaline Cast: NONE SEEN /LPF
Ketones, ur: NEGATIVE
Nitrite: NEGATIVE
Protein, ur: NEGATIVE
Specific Gravity, Urine: 1.016 (ref 1.001–1.03)
Squamous Epithelial / HPF: NONE SEEN /HPF (ref <=5)
pH: 5.5 (ref 5.0–8.0)

## 2020-04-12 LAB — CBC WITH DIFFERENTIAL/PLATELET
Absolute Monocytes: 557 cells/uL (ref 200–950)
Basophils Absolute: 42 cells/uL (ref 0–200)
Basophils Relative: 0.8 %
Eosinophils Absolute: 90 cells/uL (ref 15–500)
Eosinophils Relative: 1.7 %
HCT: 38.4 % (ref 35.0–45.0)
Hemoglobin: 12.4 g/dL (ref 11.7–15.5)
Lymphs Abs: 1590 cells/uL (ref 850–3900)
MCH: 28.6 pg (ref 27.0–33.0)
MCHC: 32.3 g/dL (ref 32.0–36.0)
MCV: 88.5 fL (ref 80.0–100.0)
MPV: 10.1 fL (ref 7.5–12.5)
Monocytes Relative: 10.5 %
Neutro Abs: 3021 cells/uL (ref 1500–7800)
Neutrophils Relative %: 57 %
Platelets: 286 10*3/uL (ref 140–400)
RBC: 4.34 10*6/uL (ref 3.80–5.10)
RDW: 12.4 % (ref 11.0–15.0)
Total Lymphocyte: 30 %
WBC: 5.3 10*3/uL (ref 3.8–10.8)

## 2020-04-12 LAB — COMPLETE METABOLIC PANEL WITH GFR
AG Ratio: 2.3 (calc) (ref 1.0–2.5)
ALT: 37 U/L — ABNORMAL HIGH (ref 6–29)
AST: 47 U/L — ABNORMAL HIGH (ref 10–35)
Albumin: 4.3 g/dL (ref 3.6–5.1)
Alkaline phosphatase (APISO): 75 U/L (ref 37–153)
BUN: 19 mg/dL (ref 7–25)
CO2: 29 mmol/L (ref 20–32)
Calcium: 9.2 mg/dL (ref 8.6–10.4)
Chloride: 105 mmol/L (ref 98–110)
Creat: 0.93 mg/dL (ref 0.60–0.93)
GFR, Est African American: 72 mL/min/{1.73_m2} (ref >=60)
GFR, Est Non African American: 62 mL/min/{1.73_m2} (ref >=60)
Globulin: 1.9 g/dL (calc) (ref 1.9–3.7)
Glucose, Bld: 105 mg/dL — ABNORMAL HIGH (ref 65–99)
Potassium: 4.9 mmol/L (ref 3.5–5.3)
Sodium: 139 mmol/L (ref 135–146)
Total Bilirubin: 0.3 mg/dL (ref 0.2–1.2)
Total Protein: 6.2 g/dL (ref 6.1–8.1)

## 2020-04-12 LAB — IRON: Iron: 64 ug/dL (ref 45–160)

## 2020-04-12 LAB — HEMOGLOBIN A1C
Hgb A1c MFr Bld: 6.3 % of total Hgb — ABNORMAL HIGH (ref <5.7)
Mean Plasma Glucose: 134 (calc)
eAG (mmol/L): 7.4 (calc)

## 2020-04-12 LAB — MICROALBUMIN / CREATININE URINE RATIO
Creatinine, Urine: 83 mg/dL (ref 20–275)
Microalb Creat Ratio: 8 mcg/mg creat (ref <30)
Microalb, Ur: 0.7 mg/dL

## 2020-04-12 LAB — LIPID PANEL
Cholesterol: 160 mg/dL (ref <200)
HDL: 41 mg/dL — ABNORMAL LOW (ref >=50)
LDL Cholesterol (Calc): 89 mg/dL (calc)
Non-HDL Cholesterol (Calc): 119 mg/dL (calc) (ref <130)
Total CHOL/HDL Ratio: 3.9 (calc) (ref <5.0)
Triglycerides: 201 mg/dL — ABNORMAL HIGH (ref <150)

## 2020-04-12 LAB — VITAMIN D 25 HYDROXY (VIT D DEFICIENCY, FRACTURES): Vit D, 25-Hydroxy: 53 ng/mL (ref 30–100)

## 2020-04-12 LAB — MAGNESIUM: Magnesium: 1.9 mg/dL (ref 1.5–2.5)

## 2020-04-12 LAB — TSH: TSH: 3.53 mIU/L (ref 0.40–4.50)

## 2020-04-12 LAB — FERRITIN: Ferritin: 55 ng/mL (ref 16–288)

## 2020-04-12 LAB — VITAMIN B12: Vitamin B-12: 332 pg/mL (ref 200–1100)

## 2020-04-18 ENCOUNTER — Other Ambulatory Visit: Payer: Self-pay

## 2020-04-18 MED ORDER — SUMATRIPTAN SUCCINATE 25 MG PO TABS: ORAL_TABLET | ORAL | 0 refills | Status: DC

## 2020-04-18 MED ORDER — ONDANSETRON 8 MG PO TBDP: 8.0000 mg | ORAL_TABLET | Freq: Three times a day (TID) | ORAL | 0 refills | Status: DC | PRN

## 2020-04-24 ENCOUNTER — Other Ambulatory Visit: Payer: Self-pay | Admitting: Internal Medicine

## 2020-04-24 DIAGNOSIS — F5101 Primary insomnia: Secondary | ICD-10-CM

## 2020-04-24 DIAGNOSIS — F339 Major depressive disorder, recurrent, unspecified: Secondary | ICD-10-CM

## 2020-04-30 ENCOUNTER — Ambulatory Visit: Payer: Medicare HMO | Attending: Internal Medicine

## 2020-04-30 DIAGNOSIS — Z23 Encounter for immunization: Secondary | ICD-10-CM

## 2020-04-30 NOTE — Progress Notes (Signed)
° °  Covid-19 Vaccination Clinic  Name:  Kimberly Ray    MRN: [id number] DOB: 24-Sep-1950  04/30/2020  Kimberly Ray was observed post Covid-19 immunization for 15 minutes without incident. She was provided with Vaccine Information Sheet and instruction to access the V-Safe system.   Kimberly Ray was instructed to call 911 with any severe reactions post vaccine:  Difficulty breathing   Swelling of face and throat   A fast heartbeat   A bad rash all over body   Dizziness and weakness

## 2020-05-07 MED FILL — PFIZER-BIONTECH COVID-19 VA: 30 | 1 days supply | Qty: 0 | Fill #0

## 2020-06-10 ENCOUNTER — Other Ambulatory Visit: Payer: Self-pay | Admitting: Internal Medicine

## 2020-06-21 ENCOUNTER — Other Ambulatory Visit: Payer: Self-pay | Admitting: Adult Health

## 2020-06-21 DIAGNOSIS — G2581 Restless legs syndrome: Secondary | ICD-10-CM

## 2020-06-25 ENCOUNTER — Ambulatory Visit: Payer: Medicare HMO

## 2020-06-25 ENCOUNTER — Telehealth: Payer: Self-pay

## 2020-06-25 NOTE — Telephone Encounter (Signed)
Patient requesting a prescription for Sucralfate, 1g. Has taken in the past but not on current medication list. Please advise.

## 2020-06-26 NOTE — Progress Notes (Signed)
PATIENT: Jalani Cullifer DOB: [birth year]  REASON FOR VISIT: follow up HISTORY FROM: patient  HISTORY OF PRESENT ILLNESS: Today 06/27/20 Ms. Noh is a 69 year old female with history of diabetes, restless leg syndrome, memory disturbance, and visual/olfactory hallucinations.  MRI of the brain was essentially normal for age.  Laboratory evaluation (RPR, B12, sed rate, CMP) was unremarkable.  Has several issues of concern:  Balance, gait: Noted difficulty getting out of a chair, balance is off, used a cane a few times felt much steadier, 1.5 years ago, had a fall fracturing her left ankle, is understandably fearful of falling again, sometimes walking her Achilles tendon may feel tight, in the summer, she was swimming, had notable improvement. No report of back pain, no numbness/tingling of lower extremities.  Memory: Lives alone, difficulty with conversation, losing train of thought, finding her words.  Does her own ADLs, drives a car.  MMSE 28/30.  RLS: On Requip, 1 mg at bedtime, rarely takes another tablet in the afternoon if needed  CPAP: Uses every night faithfully  Hallucinations: few visual out of periphery, after staying with her mother for a few days, getting back to her house.  No further olfactory (tobacco) smells.   Depression: Follows with psychiatry, on stable doses of Wellbutrin, Cymbalta, Lamictal.  Has seasonal affective disorder.  Here today for follow-up unaccompanied.  Is concerned she has dementia or Parkinson's disease.  Has started several supplements, iron, magnesium, zinc, B12.  Overall, felt all of her symptoms improved over the summer, which is a better season for her mood disorder, also was exercising.  HISTORY 12/26/2019 Dr. Jannifer Franklin: Ms. Profit is a 69 year old right-handed white female with a history of diabetes and restless leg syndrome.  Restless leg syndrome runs in her family, her father and her brother have/had similar issues.  Over the last 5 or 6  years, she has noted some mild changes in memory.  She has had occasional events which she cannot figure out how things work, she may have some difficulty with remembering recent events or remembering names for people.  She over the last year has had occasional events where she may smell of tobacco smoke when there is none.  She has had events where she may see a vague object in her right peripheral vision but when she looks she sees nothing.  She sometimes thinks it is a mouse or a person, but she never actually sees the animal or person, she just has a sensation that that is what it is.  The patient is on Requip taking 5 mg at night, she has not had any recent increase in her dosing.  She has been placed on Seroquel.  The patient has also noted some change in balance, she will stumble on occasion, she fell last summer and fractured the left ankle.  She denies any numbness in her feet associated with her diabetes.  She has occasional headaches, she denies significant issues controlling the bowels or the bladder, she occasionally will have stress incontinence of the bladder.  She claims that her father also had memory problems before he died.  Occasionally when she stands up she may have a bit of a tremor involving the legs, occasionally a jaw tremor or tremor involving the left shoulder.  Currently, the patient is able to keep up with her medications and appointments but she is now using a pill dispenser.  She is able to operate a motor vehicle, she denies any issues getting lost.  She is  sent to this office for further evaluation.  REVIEW OF SYSTEMS: Out of a complete 14 system review of symptoms, the patient complains only of the following symptoms, and all other reviewed systems are negative.  Walking difficulty, memory loss, restless legs, depression  ALLERGIES: Allergies  Allergen Reactions   Erythromycin Other (See Comments)    TBS   Iodinated Diagnostic Agents Other (See Comments)    Hot  flashes, itching scalp Hot flashes, itching scalp   Penicillins Hives   Clarithromycin Other (See Comments)    Itching ankles   Brintellix [Vortioxetine]     Itching    HOME MEDICATIONS: Outpatient Medications Prior to Visit  Medication Sig Dispense Refill   acetaminophen (TYLENOL) 650 MG CR tablet Take 650 mg by mouth every 8 (eight) hours as needed for pain.     Ascorbic Acid (VITAMIN C) 100 MG tablet Take 100 mg by mouth daily.     buPROPion (WELLBUTRIN XL) 300 MG 24 hr tablet TAKE ONE TABLET DAILY FOR MOOD 90 tablet 0   cholecalciferol (VITAMIN D3) 25 MCG (1000 UNIT) tablet Take 4,000 Units by mouth daily.      Coenzyme Q10 (CO Q 10 PO) Take by mouth.     DULoxetine (CYMBALTA) 30 MG capsule Take 1 capsule Daily for Mood 90 capsule 0   ezetimibe (ZETIA) 10 MG tablet Take      1 tablet      Daily      for Cholesterol 90 tablet 0   famotidine (PEPCID) 20 MG tablet TAKE ONE TABLET BY MOUTH TWICE A DAY FOR HEARTBURN AND INDIGESTION 180 tablet 0   fenofibrate (TRICOR) 145 MG tablet Take     1 tablet     Daily     vfor Triglycerides (Blood Fats) 90 tablet 0   lamoTRIgine (LAMICTAL) 100 MG tablet Take 1 tablet Daily to Stabilize Mood (Patient taking differently: 100 mg daily. Take 1 tablet Daily to Stabilize Mood) 90 tablet 0   Omega-3 1000 MG CAPS Take by mouth.     ondansetron (ZOFRAN-ODT) 8 MG disintegrating tablet DISSOLVE ONE TABLET BY MOUTH EVERY 8 HOURS AS NEEDED FOR NAUSEA AND VOMITING 60 tablet 0   rOPINIRole (REQUIP) 1 MG tablet TAKE THREE TABLETS BY MOUTH EVERY NIGHT AT BEDTIME FOR RESTLESS LEGS 270 tablet 0   SUMAtriptan (IMITREX) 25 MG tablet TAKE 1-2 TABLETS BY MOUTH AT ONSET OF MIGRAINE MAY REPEAT IN 2 HOURS IF HEADACHE PERSISTS MAX OF 4 TABLETS PER 24 HR 30 tablet 0   No facility-administered medications prior to visit.    PAST MEDICAL HISTORY: Past Medical History:  Diagnosis Date   Adnexal mass 04/08/2020   Anemia    Depression    Extrinsic asthma  11/09/2014   Gait abnormality 12/26/2019   GERD (gastroesophageal reflux disease)    Hay fever    Head trauma    IBS (irritable bowel syndrome)    Memory loss    Migraines    OSA on CPAP    Reflux    RLS (restless legs syndrome)    Sinus congestion     PAST SURGICAL HISTORY: Past Surgical History:  Procedure Laterality Date   ABDOMINAL HYSTERECTOMY     BREAST BIOPSY Right 02/27/2014   Stereo- Benign   CHOLECYSTECTOMY  11/03/2017   Dr. Rosendo Gros   LAPAROSCOPIC HYSTERECTOMY  12/2017   NASAL SINUS SURGERY     ORIF ANKLE FRACTURE BIMALLEOLAR Left 03/14/2019   [facility], Dr. Sheran Lawless   perforated eardrum  left side   TONSILLECTOMY AND ADENOIDECTOMY     URETEROSCOPY     for ureteral stone    FAMILY HISTORY: Family History  Problem Relation Age of Onset   Diabetes Mother    Heart failure Father    Dementia Father    Mood Disorder Father    Anxiety disorder Father    Diabetes Sister    Anxiety disorder Sister    Depression Sister    Breast cancer Maternal Grandmother 38   Breast cancer Maternal Aunt 45   Breast cancer Cousin    Osteoporosis Brother    Bipolar disorder Brother    Anxiety disorder Brother    Heart failure Maternal Grandfather 41   Colon cancer Paternal Grandmother 23   Mental illness Paternal Grandmother    Stomach cancer Maternal Aunt     SOCIAL HISTORY: Social History   Socioeconomic History   Marital status: Divorced    Spouse name: Not on file   Number of children: 2   Years of education: 13   Highest education level: Not on file  Occupational History    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 Patient is flight attendant. Patient has college education one year      Right handed.   Caffeine- Rare.    Social Determinants of Health   Financial Resource Strain:    Difficulty of Paying Living Expenses: Not on file  Food Insecurity:    Worried About Charity fundraiser in the Last Year: Not on file   [employer] of Food in the Last Year: Not on file  Transportation Needs:    Lack of Transportation (Medical): Not on file   Lack of Transportation (Non-Medical): Not on file  Physical Activity:    Days of Exercise per Week: Not on file   Minutes of Exercise per Session: Not on file  Stress:    Feeling of Stress : Not on file  Social Connections:    Frequency of Communication with Friends and Family: Not on file   Frequency of Social Gatherings with Friends and Family: Not on file   Attends Religious Services: Not on file   Active Member of Clubs or Organizations: Not on file   Attends Archivist Meetings: Not on file   Marital Status: Not on file  Intimate Partner Violence:    Fear of Current or Ex-Partner: Not on file   Emotionally Abused: Not on file   Physically Abused: Not on file   Sexually Abused: Not on file   PHYSICAL EXAM  Vitals:   06/27/20 1014  BP: 122/74  Pulse: 72  Weight: 180 lb 6.4 oz (81.8 kg)  Height: 5' 6.25" (1.683 m)   Body mass index is 28.9 kg/m.  Generalized: Well developed, in no acute distress  MMSE - Mini Mental State Exam 06/27/2020 12/26/2019 08/04/2019  Orientation to time 5 5 5   Orientation to Place 5 5 5   Registration  3 3 3   Attention/ Calculation 4 2 5   Recall 2 3 3   Language- name 2 objects 2 2 2   Language- repeat 1 1 1   Language- follow 3 step command 3 3 3   Language- read & follow direction 1 1 1   Write a sentence 1 1 1   Copy design 1 1 1   Total score 28 27 30      Neurological examination  Mentation: Alert oriented to time, place, history taking. Follows all commands speech and language fluent, depressed looking Cranial nerve II-XII: Pupils were equal round reactive to light. Extraocular movements were full, visual field were full on confrontational test. Facial sensation and strength were normal. Head turning and shoulder shrug  were normal and symmetric. Motor: The motor testing reveals 5 over 5 strength of all 4 extremities. Good symmetric motor tone is noted throughout.  No tremor. Sensory: Sensory testing is intact to soft touch on all 4 extremities. No evidence of extinction is noted.  Coordination: Cerebellar testing reveals good finger-nose-finger and heel-to-shin bilaterally.  Gait and station: Slightly slow to rise from seated position, gait is slightly wide-based, but steady, good arm swing, stride Reflexes: Deep tendon reflexes are symmetric and normal bilaterally.   DIAGNOSTIC DATA (LABS, IMAGING, TESTING) - I reviewed patient records, labs, notes, testing and imaging myself where available.  Lab Results  Component Value Date   WBC 5.3 04/11/2020   HGB 12.4 04/11/2020   HCT 38.4 04/11/2020   MCV 88.5 04/11/2020   PLT 286 04/11/2020      Component Value Date/Time   NA 139 04/11/2020 1102   NA 141 12/26/2019 1227   K 4.9 04/11/2020 1102   CL 105 04/11/2020 1102   CO2 29 04/11/2020 1102   GLUCOSE 105 (H) 04/11/2020 1102   BUN 19 04/11/2020 1102   BUN 16 12/26/2019 1227   CREATININE 0.93 04/11/2020 1102   CALCIUM 9.2 04/11/2020 1102   PROT 6.2 04/11/2020 1102   PROT 6.8 12/26/2019 1227   ALBUMIN 4.8 12/26/2019 1227   AST 47 (H) 04/11/2020 1102   ALT 37 (H) 04/11/2020 1102   ALKPHOS 90 12/26/2019 1227   BILITOT 0.3 04/11/2020 1102   BILITOT 0.2 12/26/2019 1227   GFRNONAA 62 04/11/2020 1102   GFRAA 72 04/11/2020 1102   Lab Results  Component Value Date   CHOL 160 04/11/2020   HDL 41 (L) 04/11/2020   LDLCALC 89  04/11/2020   TRIG 201 (H) 04/11/2020   CHOLHDL 3.9 04/11/2020   Lab Results  Component Value Date   HGBA1C 6.3 (H) 04/11/2020   Lab Results  Component Value Date   VITAMINB12 332 04/11/2020   Lab Results  Component Value Date   TSH 3.53 04/11/2020   ASSESSMENT AND PLAN 69 y.o. year old female  has a past medical history of Adnexal mass (04/08/2020), Anemia, Depression, Extrinsic asthma (11/09/2014), Gait abnormality (12/26/2019), GERD (gastroesophageal reflux disease), Hay fever, Head trauma, IBS (irritable bowel syndrome), Memory loss, Migraines, OSA on CPAP, Reflux, RLS (restless legs syndrome), and Sinus congestion. here with:  1.  Mild memory disturbance 2.  Restless leg syndrome 3.  Visual/olfactory hallucinations 4.  Gait disturbance  -MMSE 28/30, feels larger issue going on, will send for neuropsychological evaluation, underlying depression contributing? she is concerned for Parkinson's (I see no signs on exam), Lewy body dementia  -Will send for physical therapy, to work on balance, leg strengthening exercises, I see no signs of neuropathy, no report of back pain to explain gait disturbance  -  Encouraged to resume her pool exercises, considering joining the [employer], clearly did and felt better over the summer   -No further olfactory hallucinations, possibly consider EEG in future, on stable  low dose Requip   -Will follow-up in 4 months or sooner if needed  I spent 30 minutes of face-to-face and non-face-to-face time with patient.  This included previsit chart review, lab review, study review, order entry, electronic health record documentation, patient education.  Butler Denmark, AGNP-C, DNP 06/27/2020, 10:28 AM [facility] [address] [place], [place] [address] [phone]

## 2020-06-27 ENCOUNTER — Encounter: Payer: Self-pay | Admitting: Neurology

## 2020-06-27 ENCOUNTER — Ambulatory Visit: Payer: Medicare HMO | Admitting: Neurology

## 2020-06-27 VITALS — BP 122/74 | HR 72 | Ht 66.25 in | Wt 180.4 lb

## 2020-06-27 DIAGNOSIS — F339 Major depressive disorder, recurrent, unspecified: Secondary | ICD-10-CM

## 2020-06-27 DIAGNOSIS — G2581 Restless legs syndrome: Secondary | ICD-10-CM

## 2020-06-27 DIAGNOSIS — R269 Unspecified abnormalities of gait and mobility: Secondary | ICD-10-CM | POA: Diagnosis not present

## 2020-06-27 DIAGNOSIS — R413 Other amnesia: Secondary | ICD-10-CM

## 2020-06-27 NOTE — Patient Instructions (Signed)
I will send you for physical therapy Send you for neuropsychological evaluation for memory concerns  Continue current medications See you back in 4 months

## 2020-06-27 NOTE — Progress Notes (Signed)
I have read the note, and I agree with the clinical assessment and plan.  Michaelle Bottomley K Nasirah Sachs   

## 2020-06-27 NOTE — Telephone Encounter (Signed)
Left message to call back  

## 2020-06-29 ENCOUNTER — Encounter: Payer: Self-pay | Admitting: Psychiatry

## 2020-06-29 ENCOUNTER — Other Ambulatory Visit: Payer: Self-pay

## 2020-06-29 ENCOUNTER — Ambulatory Visit (INDEPENDENT_AMBULATORY_CARE_PROVIDER_SITE_OTHER): Payer: Medicare HMO | Admitting: Psychiatry

## 2020-06-29 DIAGNOSIS — F39 Unspecified mood [affective] disorder: Secondary | ICD-10-CM | POA: Diagnosis not present

## 2020-06-29 DIAGNOSIS — F319 Bipolar disorder, unspecified: Secondary | ICD-10-CM | POA: Diagnosis not present

## 2020-06-29 MED ORDER — LAMOTRIGINE 100 MG PO TABS: ORAL_TABLET | ORAL | 0 refills | Status: DC

## 2020-06-29 MED ORDER — BUPROPION HCL ER (XL) 300 MG PO TB24: ORAL_TABLET | ORAL | 1 refills | Status: DC

## 2020-06-29 MED ORDER — DULOXETINE HCL 30 MG PO CPEP: ORAL_CAPSULE | ORAL | 0 refills | Status: DC

## 2020-06-29 NOTE — Progress Notes (Signed)
Kimberly Ray [id number] June 24, 1951 69 y.o.  Subjective:   Patient ID:  Kimberly Ray is a 69 y.o. (DOB 10/08/1950) female.  Chief Complaint:  Chief Complaint  Patient presents with   Memory Loss   Depression   Follow-up    h/o Hallucinations    HPI Kimberly Ray presents to the office today for follow-up of depression, memory changes, and h/o hallucinations. She was seen by neurology and referred for neuopsych testing.   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 She has been helping her cousin some with this and "mentally I think it has been good for me."   She reports that her mood has been more down over the last few days. Energy has been lower the last couple of days.Motivation has also been lower. Has been "hurting all over" recently and this has impacted her mood. She reports that she has been spending some money with helping establish antique business and may have some anxiety about this. Also feeling discouraged with another partner's behavior and attitude. She reports that she has been sleeping well. Having vivid dreams. Appetite has been stable. She reports that her concentration and focus have not been as good. Had difficulty sorting medications this morning and had difficulty making decisions. Denies any recent olfactory hallucinations. One evening she thought she saw a shadow going across the hall at night. She reports that her memory seemed to improve "and now it is slipping again." She reports that memory changes seem to correlate with depression. Had been enjoying things until a few days ago. Denies SI.   Reports that she has had seasonal depression in the past. She has a light box and has not been using it recently.    Past Psychiatric Medication Trials: Seroquel-increased appetite. Unsure of response. Has taken 6-9 months. Denies any worsening in unsteadiness.  Lamictal- Has seemed to be helpful to prevent  recurrence of depression. Taken long-term. Side effects with 150 mg po qd.  Cymbalta- Had been on 60 mg in the past. Initially was helpful Prozac- first medication prescribed and "it was like magic" and took for years Viibryd Brintellix Wellbutrin XL- Has taken 150 mg and 300 mg. Has taken long-term. Seemed to be effective initially and then no longer as effective.  Nortriptyline Gabapentin Xanax Methylphenidate- took for excessive daytime somnolence  Mini-Mental     Office Visit from 06/27/2020 in [facility] Office Visit from 12/26/2019 in [facility] Office Visit from 08/04/2019 in [facility]  Total Score (max 30 points ) 28 27 30     PHQ2-9     Office Visit from 04/11/2020 in [facility] [place] Office Visit from 10/27/2019 in [facility] [place] Office Visit from 09/01/2019 in [facility] [place] Office Visit from 08/04/2019 in [facility] [place] Office Visit from 08/16/2018 in [facility]  PHQ-2 Total Score 6 0 2 5 6   PHQ-9 Total Score 19 -- 11 21 22        Review of Systems:  Review of Systems  Gastrointestinal: Positive for constipation.  Musculoskeletal: Negative for gait problem.       Reports that achilles heel feels tight. Reports that she has some tenderness throughout her body.   Neurological:       Difficulty with balance. She reports that RLS has been "not bad." Notices that Ropinorole works best when she takes it earlier prior  to the onset of RLS.   Psychiatric/Behavioral:       Please refer to HPI    Medications: I have reviewed the patient's current medications.  Current Outpatient Medications  Medication Sig Dispense Refill   acetaminophen (TYLENOL) 650 MG CR tablet Take 650 mg by mouth every 8 (eight) hours as needed for pain.     Ascorbic Acid  (VITAMIN C) 100 MG tablet Take 100 mg by mouth daily.     buPROPion (WELLBUTRIN XL) 300 MG 24 hr tablet TAKE ONE TABLET DAILY FOR MOOD 90 tablet 1   cholecalciferol (VITAMIN D3) 25 MCG (1000 UNIT) tablet Take 4,000 Units by mouth daily.      Coenzyme Q10 (CO Q 10 PO) Take by mouth.     DULoxetine (CYMBALTA) 30 MG capsule Take 1 capsule Daily for Mood 90 capsule 0   ezetimibe (ZETIA) 10 MG tablet Take      1 tablet      Daily      for Cholesterol 90 tablet 0   famotidine (PEPCID) 20 MG tablet TAKE ONE TABLET BY MOUTH TWICE A DAY FOR HEARTBURN AND INDIGESTION 180 tablet 0   fenofibrate (TRICOR) 145 MG tablet Take     1 tablet     Daily     vfor Triglycerides (Blood Fats) 90 tablet 0   lamoTRIgine (LAMICTAL) 100 MG tablet Take 1 tablet Daily to Stabilize Mood 90 tablet 0   Omega-3 1000 MG CAPS Take by mouth.     ondansetron (ZOFRAN-ODT) 8 MG disintegrating tablet DISSOLVE ONE TABLET BY MOUTH EVERY 8 HOURS AS NEEDED FOR NAUSEA AND VOMITING 60 tablet 0   rOPINIRole (REQUIP) 1 MG tablet TAKE THREE TABLETS BY MOUTH EVERY NIGHT AT BEDTIME FOR RESTLESS LEGS (Patient taking differently: TAKES 1-2 TABLETS BY MOUTH EVERY NIGHT AT BEDTIME FOR RESTLESS LEGS) 270 tablet 0   SUMAtriptan (IMITREX) 25 MG tablet TAKE 1-2 TABLETS BY MOUTH AT ONSET OF MIGRAINE MAY REPEAT IN 2 HOURS IF HEADACHE PERSISTS MAX OF 4 TABLETS PER 24 HR 30 tablet 0   No current facility-administered medications for this visit.    Medication Side Effects: None  Allergies:  Allergies  Allergen Reactions   Erythromycin Other (See Comments)    TBS   Iodinated Diagnostic Agents Other (See Comments)    Hot flashes, itching scalp Hot flashes, itching scalp   Penicillins Hives   Clarithromycin Other (See Comments)    Itching ankles   Brintellix [Vortioxetine]     Itching    Past Medical History:  Diagnosis Date   Adnexal mass 04/08/2020   Anemia    Depression    Extrinsic asthma 11/09/2014   Gait abnormality  12/26/2019   GERD (gastroesophageal reflux disease)    Hay fever    Head trauma    IBS (irritable bowel syndrome)    Memory loss    Migraines    OSA on CPAP    Reflux    RLS (restless legs syndrome)    Sinus congestion     Family History  Problem Relation Age of Onset   Diabetes Mother    Heart failure Father    Dementia Father    Mood Disorder Father    Anxiety disorder Father    Diabetes Sister    Anxiety disorder Sister    Depression Sister    Breast cancer Maternal Grandmother 39   Breast cancer Maternal Aunt 45   Breast cancer Cousin    Osteoporosis Brother    Bipolar  disorder Brother    Anxiety disorder Brother    Heart failure Maternal Grandfather 29   Colon cancer Paternal Grandmother 22   Mental illness Paternal Grandmother    Stomach cancer Maternal Aunt     Social History   Socioeconomic History   Marital status: Divorced    Spouse name: Not on file   Number of children: 2   Years of education: 13   Highest education level: Not on file  Occupational History    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 Patient is flight attendant. Patient has college education one year      Right handed.   Caffeine- Rare.    Social Determinants of Health   Financial Resource Strain:    Difficulty of Paying Living Expenses: Not on file  Food Insecurity:    Worried About Charity fundraiser in the Last Year: Not on file   [employer] of Food in the Last Year: Not on file  Transportation Needs:     Lack of Transportation (Medical): Not on file   Lack of Transportation (Non-Medical): Not on file  Physical Activity:    Days of Exercise per Week: Not on file   Minutes of Exercise per Session: Not on file  Stress:    Feeling of Stress : Not on file  Social Connections:    Frequency of Communication with Friends and Family: Not on file   Frequency of Social Gatherings with Friends and Family: Not on file   Attends Religious Services: Not on file   Active Member of Clubs or Organizations: Not on file   Attends Archivist Meetings: Not on file   Marital Status: Not on file  Intimate Partner Violence:    Fear of Current or Ex-Partner: Not on file   Emotionally Abused: Not on file   Physically Abused: Not on file   Sexually Abused: Not on file    Past Medical History, Surgical history, Social history, and Family history were reviewed and updated as appropriate.   Please see review of systems for further details on the patient's review from today.   Objective:   Physical Exam:  There were no vitals taken for this visit.  Physical Exam Constitutional:      General: She is not in acute distress. Musculoskeletal:        General: No deformity.  Neurological:     Mental Status: She is alert and oriented to person, place, and time.     Coordination: Coordination normal.  Psychiatric:        Attention and Perception: Attention and perception normal. She does not perceive auditory or visual hallucinations.        Mood and Affect: Mood is depressed. Mood is not anxious. Affect is not labile, blunt, angry or inappropriate.        Speech: Speech normal.        Behavior: Behavior normal. Behavior is cooperative.  Thought Content: Thought content normal. Thought content is not paranoid or delusional. Thought content does not include homicidal or suicidal ideation. Thought content does not include homicidal or suicidal plan.        Cognition and Memory:  Cognition and memory normal.        Judgment: Judgment normal.     Comments: Insight intact     Lab Review:     Component Value Date/Time   NA 139 04/11/2020 1102   NA 141 12/26/2019 1227   K 4.9 04/11/2020 1102   CL 105 04/11/2020 1102   CO2 29 04/11/2020 1102   GLUCOSE 105 (H) 04/11/2020 1102   BUN 19 04/11/2020 1102   BUN 16 12/26/2019 1227   CREATININE 0.93 04/11/2020 1102   CALCIUM 9.2 04/11/2020 1102   PROT 6.2 04/11/2020 1102   PROT 6.8 12/26/2019 1227   ALBUMIN 4.8 12/26/2019 1227   AST 47 (H) 04/11/2020 1102   ALT 37 (H) 04/11/2020 1102   ALKPHOS 90 12/26/2019 1227   BILITOT 0.3 04/11/2020 1102   BILITOT 0.2 12/26/2019 1227   GFRNONAA 62 04/11/2020 1102   GFRAA 72 04/11/2020 1102       Component Value Date/Time   WBC 5.3 04/11/2020 1102   RBC 4.34 04/11/2020 1102   HGB 12.4 04/11/2020 1102   HCT 38.4 04/11/2020 1102   PLT 286 04/11/2020 1102   MCV 88.5 04/11/2020 1102   MCH 28.6 04/11/2020 1102   MCHC 32.3 04/11/2020 1102   RDW 12.4 04/11/2020 1102   LYMPHSABS 1,590 04/11/2020 1102   MONOABS 576 01/20/2017 1006   EOSABS 90 04/11/2020 1102   BASOSABS 42 04/11/2020 1102    No results found for: POCLITH, LITHIUM   No results found for: PHENYTOIN, PHENOBARB, VALPROATE, CBMZ   .res Assessment: Plan:   She reports that her medications seem to be stable and she would like to continue current medications. She reports that recent depressive s/s may be related to psychosocial factors and seasonal depression in response to recent daylight savings time. Discussed strategies to prevent worsening seasonal depression to include use of lighttbox and increasing exposure to natural sunlight. Discussed tips related to use of the light box and provided pt with handout regarding use of lightbox.  Will continue current medications at this time and advised pt to contact office if mood does not improve or worsens. Continue Wellbutrin XL 300 mg po qd for depression. Continue  Cymbalta 30 mg po qd for depression.  Continue Lamictal 100 mg po qd for mood stabilization.  Pt to follow-up in 6 weeks or sooner if clinically indicated.  Patient advised to contact office with any questions, adverse effects, or acute worsening in signs and symptoms.  Marlies was seen today for memory loss, depression and follow-up.  Diagnoses and all orders for this visit:  Episodic mood disorder (HCC) -     buPROPion (WELLBUTRIN XL) 300 MG 24 hr tablet; TAKE ONE TABLET DAILY FOR MOOD -     DULoxetine (CYMBALTA) 30 MG capsule; Take 1 capsule Daily for Mood  Bipolar depression (HCC) -     lamoTRIgine (LAMICTAL) 100 MG tablet; Take 1 tablet Daily to Stabilize Mood     Please see After Visit Summary for patient specific instructions.  Future Appointments  Date Time Provider [place]  07/10/2020 12:30 PM [facility] [name] 1 [facility]  07/24/2020  9:30 AM Unk Pinto, MD [facility] None  08/21/2020 10:00 AM Thayer Headings, PMHNP CP-CP None  09/05/2020 11:30 AM  [facility] DX DEXA 1 GI-BCGDG [facility]-BREAST [facility]  10/25/2020  1:15 PM [name], NP [facility]NA-[facility]NA None  10/26/2020  9:30 AM Liane Comber, NP [facility] None  04/17/2021 10:00 AM Liane Comber, NP [facility] None    No orders of the defined types were placed in this encounter.   -------------------------------

## 2020-07-04 ENCOUNTER — Encounter: Payer: Self-pay | Admitting: Counselor

## 2020-07-04 ENCOUNTER — Other Ambulatory Visit: Payer: Self-pay | Admitting: Psychiatry

## 2020-07-04 DIAGNOSIS — F39 Unspecified mood [affective] disorder: Secondary | ICD-10-CM

## 2020-07-10 ENCOUNTER — Ambulatory Visit
Admission: RE | Admit: 2020-07-10 | Discharge: 2020-07-10 | Disposition: A | Discharge status: Home / Self Care | Payer: Medicare HMO | Source: Ambulatory Visit | Attending: Internal Medicine | Admitting: Internal Medicine

## 2020-07-10 ENCOUNTER — Other Ambulatory Visit: Payer: Self-pay

## 2020-07-10 ENCOUNTER — Other Ambulatory Visit: Payer: Self-pay | Admitting: Internal Medicine

## 2020-07-10 DIAGNOSIS — N6310 Unspecified lump in the right breast, unspecified quadrant: Secondary | ICD-10-CM

## 2020-07-10 DIAGNOSIS — N6489 Other specified disorders of breast: Secondary | ICD-10-CM | POA: Diagnosis not present

## 2020-07-20 ENCOUNTER — Ambulatory Visit
Admission: RE | Admit: 2020-07-20 | Discharge: 2020-07-20 | Disposition: A | Discharge status: Home / Self Care | Payer: Medicare HMO | Source: Ambulatory Visit | Attending: Internal Medicine | Admitting: Internal Medicine

## 2020-07-20 ENCOUNTER — Other Ambulatory Visit: Payer: Self-pay

## 2020-07-20 DIAGNOSIS — N6310 Unspecified lump in the right breast, unspecified quadrant: Secondary | ICD-10-CM

## 2020-07-20 DIAGNOSIS — D241 Benign neoplasm of right breast: Secondary | ICD-10-CM | POA: Diagnosis not present

## 2020-07-20 DIAGNOSIS — N6312 Unspecified lump in the right breast, upper inner quadrant: Secondary | ICD-10-CM | POA: Diagnosis not present

## 2020-07-24 ENCOUNTER — Ambulatory Visit: Payer: Medicare HMO | Admitting: Internal Medicine

## 2020-07-30 ENCOUNTER — Ambulatory Visit: Payer: Medicare HMO | Admitting: Internal Medicine

## 2020-07-30 ENCOUNTER — Encounter: Payer: Self-pay | Admitting: Internal Medicine

## 2020-07-30 NOTE — Progress Notes (Addendum)
     C  A N  C  E L  L  E  D          

## 2020-08-08 ENCOUNTER — Encounter: Payer: Medicare HMO | Admitting: Counselor

## 2020-08-09 DIAGNOSIS — Z20822 Contact with and (suspected) exposure to covid-19: Secondary | ICD-10-CM | POA: Diagnosis not present

## 2020-08-09 DIAGNOSIS — Z20828 Contact with and (suspected) exposure to other viral communicable diseases: Secondary | ICD-10-CM | POA: Diagnosis not present

## 2020-08-15 ENCOUNTER — Encounter: Payer: Medicare HMO | Admitting: Counselor

## 2020-08-19 ENCOUNTER — Encounter: Payer: Self-pay | Admitting: Internal Medicine

## 2020-08-19 NOTE — Progress Notes (Addendum)
      R  E  S  C  H  E  D  U  L  E  D  due  to  INCLEMENT WEATHER

## 2020-08-20 ENCOUNTER — Ambulatory Visit: Payer: Medicare HMO | Admitting: Internal Medicine

## 2020-08-21 ENCOUNTER — Ambulatory Visit: Payer: Medicare HMO | Admitting: Psychiatry

## 2020-08-23 ENCOUNTER — Ambulatory Visit (INDEPENDENT_AMBULATORY_CARE_PROVIDER_SITE_OTHER): Payer: Medicare Other | Admitting: Internal Medicine

## 2020-08-23 ENCOUNTER — Encounter: Payer: Self-pay | Admitting: Internal Medicine

## 2020-08-23 ENCOUNTER — Other Ambulatory Visit: Payer: Self-pay

## 2020-08-23 VITALS — BP 116/76 | HR 90 | Temp 97.0°F | Resp 16 | Ht 67.0 in | Wt 186.2 lb

## 2020-08-23 DIAGNOSIS — Z79899 Other long term (current) drug therapy: Secondary | ICD-10-CM | POA: Diagnosis not present

## 2020-08-23 DIAGNOSIS — N1831 Chronic kidney disease, stage 3a: Secondary | ICD-10-CM

## 2020-08-23 DIAGNOSIS — R0989 Other specified symptoms and signs involving the circulatory and respiratory systems: Secondary | ICD-10-CM

## 2020-08-23 DIAGNOSIS — E559 Vitamin D deficiency, unspecified: Secondary | ICD-10-CM | POA: Diagnosis not present

## 2020-08-23 DIAGNOSIS — R7309 Other abnormal glucose: Secondary | ICD-10-CM | POA: Diagnosis not present

## 2020-08-23 DIAGNOSIS — E782 Mixed hyperlipidemia: Secondary | ICD-10-CM

## 2020-08-23 NOTE — Progress Notes (Signed)
History of Present Illness:       This very nice 70 y.o.  DWF presents for 3 month follow up with HTN, HLD, Pre-Diabetes and Vitamin D Deficiency.  Patient has OSA on CPAP with improved restorative sleep.  Patient's GERD is controlled on her meds.        Patient has long established hx/o Bipolar Depression and has been followed by Psychiatry.           Patient is followed  for labile HTN & BP has been controlled at home. Today's BP is at goal -  116/76. Patient has had no complaints of any cardiac type chest pain, palpitations, dyspnea / orthopnea / PND, dizziness, claudication, or dependent edema.      Hyperlipidemia is near controlled with diet & Ezetimibe Venia Minks.. Patient denies myalgias or other med SE's. Last Lipids were at goal except elevated Trig's:  Lab Results  Component Value Date   CHOL 160 04/11/2020   HDL 41 (L) 04/11/2020   LDLCALC 89 04/11/2020   TRIG 201 (H) 04/11/2020   CHOLHDL 3.9 04/11/2020    Also, the patient has history of PreDiabetes (6.3% /2011)  and has had no symptoms of reactive hypoglycemia, diabetic polys, paresthesias or visual blurring.  Last A1c was   Lab Results  Component Value Date   HGBA1C 6.3 (H) 04/11/2020       Further, the patient also has history of Vitamin D Deficiency ("33" /2012)and supplements vitamin D without any suspected side-effects. Last vitamin D was still not at goal (70-100):  Lab Results  Component Value Date   VD25OH 53 04/11/2020    Current Outpatient Medications on File Prior to Visit  Medication Sig  . acetaminophen 650 MG  Take 650 mg by mouth every 8  hours as needed for pain.  Marland Kitchen VITAMIN C 100 MG tablet Take 100 mg  daily.  Marland Kitchen buPROPion-XL 300 MG  TAKE ONE TABLET DAILY FOR MOOD  . VITAMIN D Take 4,000 Units daily.   . Coenzyme Q10 Take by mouth.  . DULoxetine 30 MG capsule Take 1 capsule Daily for Mood  . ezetimibe 10 MG tablet Take      1 tablet      Daily      for Cholesterol  . famotidine 20  MG tablet TAKE ONE TABLET TWICE A DAY   . fenofibrate  145 MG tablet Take     1 tablet     Daily   . lamoTRIgine  100 MG tablet Take 1 tablet Daily to Stabilize Mood  . Omega-3 1000 MG CAPS Take by mouth.  . ondansetron -ODT 8 MG Take 1 tab every 8 hrs as needed   . rOPINIRole  1 MG tablet TAKES 1-2 TABLETS AT BEDTIME   . sucralfate 1 g tablet Take 1 g  4 times daily   . SUMAtriptan  25 MG tablet Take 1 tab for HA & may repeat in 2 hr - max 2 tab /24 hr    Allergies  Allergen Reactions  . Erythromycin Other (See Comments)    TBS  . Iodinated Diagnostic Agents Other (See Comments)    Hot flashes, itching scalp Hot flashes, itching scalp  . Penicillins Hives  . Clarithromycin Other (See Comments)    Itching ankles  . Brintellix [Vortioxetine]     Itching   PMHx:   Past Medical History:  Diagnosis Date  . Adnexal mass 04/08/2020  . Anemia   . Depression   .  Extrinsic asthma 11/09/2014  . Gait abnormality 12/26/2019  . GERD (gastroesophageal reflux disease)   . Hay fever   . Head trauma   . IBS (irritable bowel syndrome)   . Memory loss   . Migraines   . OSA on CPAP   . Reflux   . RLS (restless legs syndrome)   . Sinus congestion     Immunization History  Administered Date(s) Administered  . Influenza, High Dose Seasonal PF 06/12/2016, 05/13/2017, 06/25/2020  . Influenza-Unspecified 06/02/2015, 05/19/2018, 05/19/2019  . PFIZER SARS-COV-2 Vaccination 09/12/2019, 10/03/2019, 04/30/2020  . PPD Test 08/19/2011, 08/30/2013, 11/09/2014  . Pneumococcal Conjugate-13 08/18/2009, 02/18/2019  . Pneumococcal Polysaccharide-23 06/12/2016  . Td 08/19/2003  . Tdap 08/30/2013  . Zoster 08/18/2010    Past Surgical History:  Procedure Laterality Date  . ABDOMINAL HYSTERECTOMY    . BREAST BIOPSY Right 02/27/2014   Stereo- Benign  . CHOLECYSTECTOMY  11/03/2017   Dr. Rosendo Gros  . LAPAROSCOPIC HYSTERECTOMY  12/2017  . NASAL SINUS SURGERY    . ORIF ANKLE FRACTURE BIMALLEOLAR Left  03/14/2019   [facility], Dr. Sheran Lawless  . perforated eardrum     left side  . TONSILLECTOMY AND ADENOIDECTOMY    . URETEROSCOPY     for ureteral stone    FHx:    Reviewed / unchanged  SHx:    Reviewed / unchanged   Systems Review:  Constitutional: Denies fever, chills, wt changes, headaches, insomnia, fatigue, night sweats, change in appetite. Eyes: Denies redness, blurred vision, diplopia, discharge, itchy, watery eyes.  ENT: Denies discharge, congestion, post nasal drip, epistaxis, sore throat, earache, hearing loss, dental pain, tinnitus, vertigo, sinus pain, snoring.  CV: Denies chest pain, palpitations, irregular heartbeat, syncope, dyspnea, diaphoresis, orthopnea, PND, claudication or edema. Respiratory: denies cough, dyspnea, DOE, pleurisy, hoarseness, laryngitis, wheezing.  Gastrointestinal: Denies dysphagia, odynophagia, heartburn, reflux, water brash, abdominal pain or cramps, nausea, vomiting, bloating, diarrhea, constipation, hematemesis, melena, hematochezia  or hemorrhoids. Genitourinary: Denies dysuria, frequency, urgency, nocturia, hesitancy, discharge, hematuria or flank pain. Musculoskeletal: Denies arthralgias, myalgias, stiffness, jt. swelling, pain, limping or strain/sprain.  Skin: Denies pruritus, rash, hives, warts, acne, eczema or change in skin lesion(s). Neuro: No weakness, tremor, incoordination, spasms, paresthesia or pain. Psychiatric: Denies confusion, memory loss or sensory loss. Endo: Denies change in weight, skin or hair change.  Heme/Lymph: No excessive bleeding, bruising or enlarged lymph nodes.  Physical Exam  BP 116/76   Pulse 90   Temp (!) 97 F (36.1 C)   Resp 16   Ht 5\' 7"  (1.702 m)   Wt 186 lb 3.2 oz (84.5 kg)   SpO2 97%   BMI 29.16 kg/m   Appears  well nourished, well groomed  and in no distress.  Eyes: PERRLA, EOMs, conjunctiva no swelling or erythema. Sinuses: No frontal/maxillary tenderness ENT/Mouth: EAC's clear, TM's nl w/o  erythema, bulging. Nares clear w/o erythema, swelling, exudates. Oropharynx clear without erythema or exudates. Oral hygiene is good. Tongue normal, non obstructing. Hearing intact.  Neck: Supple. Thyroid not palpable. Car 2+/2+ without bruits, nodes or JVD. Chest: Respirations nl with BS clear & equal w/o rales, rhonchi, wheezing or stridor.  Cor: Heart sounds normal w/ regular rate and rhythm without sig. murmurs, gallops, clicks or rubs. Peripheral pulses normal and equal  without edema.  Abdomen: Soft & bowel sounds normal. Non-tender w/o guarding, rebound, hernias, masses or organomegaly.  Lymphatics: Unremarkable.  Musculoskeletal: Full ROM all peripheral extremities, joint stability, 5/5 strength and normal gait.  Skin: Warm, dry without exposed rashes,  lesions or ecchymosis apparent.  Neuro: Cranial nerves intact, reflexes equal bilaterally. Sensory-motor testing grossly intact. Tendon reflexes grossly intact.  Pysch: Alert & oriented x 3.  Insight and judgement nl & appropriate. No ideations.  Assessment and Plan:  1. Labile hypertension  - Continue medication, monitor blood pressure at home.  - Continue DASH diet.  Reminder to go to the ER if any CP,  SOB, nausea, dizziness, severe HA, changes vision/speech.   - TSH  2. Hyperlipidemia, mixed  - Continue diet/meds, exercise,& lifestyle modifications.  - Continue monitor periodic cholesterol/liver & renal functions   - Lipid panel - TSH  3. Abnormal glucose  - Continue diet, exercise  - Lifestyle modifications.  - Monitor appropriate labs.  - CBC with Differential/Platelet - COMPLETE METABOLIC PANEL WITH GFR - Magnesium - Fructosamine - Insulin, random  4. Vitamin D deficiency  - Continue supplementation.  - VITAMIN D 25 Hydroxy   5. Stage 3a chronic kidney disease (HCC)  - COMPLETE METABOLIC PANEL WITH GFR  6. Medication management  - CBC with Differential/Platelet - COMPLETE METABOLIC PANEL WITH GFR -  Magnesium - Lipid panel - TSH - Fructosamine - Insulin, random - VITAMIN D 25 Hydroxy        Discussed  regular exercise, BP monitoring, weight control to achieve/maintain BMI less than 25 and discussed med and SE's. Recommended labs to assess and monitor clinical status with further disposition pending results of labs.  I discussed the assessment and treatment plan with the patient. The patient was provided an opportunity to ask questions and all were answered. The patient agreed with the plan and demonstrated an understanding of the instructions.  I provided over 30 minutes of exam, counseling, chart review and  complex critical decision making.         The patient was advised to call back or seek an in-person evaluation if the symptoms worsen or if the condition fails to improve as anticipated.   Marinus Maw, MD

## 2020-08-23 NOTE — Patient Instructions (Signed)

## 2020-08-24 ENCOUNTER — Other Ambulatory Visit: Payer: Self-pay | Admitting: Internal Medicine

## 2020-08-24 MED ORDER — GABAPENTIN 100 MG PO CAPS: ORAL_CAPSULE | ORAL | 0 refills | Status: DC

## 2020-08-25 NOTE — Progress Notes (Signed)
========================================================== -   Test results slightly outside the reference range are not unusual. If there is anything important, I will review this with you,  otherwise it is considered normal test values.  If you have further questions,  please do not hesitate to contact me at the office or via My Chart.  ========================================================== ==========================================================  -  Kidney Functions - Still Stage 3a   &   Stable  ==========================================================  -  Total Chol = 191 and LDL  = 100  both  Excellent   - Very low risk for Heart Attack  / Stroke ==========================================================  - But . . . . . . . . . . Marland Kitchen  - Triglycerides (   366   ) or fats in blood are too high  (goal is less than 150)    - Recommend avoid fried & greasy foods,  sweets / candy,   - Avoid white rice  (brown or wild rice or Quinoa is OK),   - Avoid white potatoes  (sweet potatoes are OK)   - Avoid anything made from white flour  - bagels, doughnuts, rolls, buns, biscuits, white and   wheat breads, pizza crust and traditional  pasta made of white flour & egg white  - (vegetarian pasta or spinach or wheat pasta is OK).    - Multi-grain bread is OK - like multi-grain flat bread or  sandwich thins.   - Avoid alcohol in excess.   - Exercise is also important. ==========================================================

## 2020-08-27 LAB — CBC WITH DIFFERENTIAL/PLATELET
Absolute Monocytes: 512 cells/uL (ref 200–950)
Basophils Absolute: 39 cells/uL (ref 0–200)
Basophils Relative: 0.7 %
Eosinophils Absolute: 72 cells/uL (ref 15–500)
Eosinophils Relative: 1.3 %
HCT: 39.8 % (ref 35.0–45.0)
Hemoglobin: 13.1 g/dL (ref 11.7–15.5)
Lymphs Abs: 1419 cells/uL (ref 850–3900)
MCH: 28.5 pg (ref 27.0–33.0)
MCHC: 32.9 g/dL (ref 32.0–36.0)
MCV: 86.5 fL (ref 80.0–100.0)
MPV: 10.2 fL (ref 7.5–12.5)
Monocytes Relative: 9.3 %
Neutro Abs: 3460 cells/uL (ref 1500–7800)
Neutrophils Relative %: 62.9 %
Platelets: 319 10*3/uL (ref 140–400)
RBC: 4.6 10*6/uL (ref 3.80–5.10)
RDW: 11.7 % (ref 11.0–15.0)
Total Lymphocyte: 25.8 %
WBC: 5.5 10*3/uL (ref 3.8–10.8)

## 2020-08-27 LAB — COMPLETE METABOLIC PANEL WITH GFR
AG Ratio: 2.4 (calc) (ref 1.0–2.5)
ALT: 35 U/L — ABNORMAL HIGH (ref 6–29)
AST: 54 U/L — ABNORMAL HIGH (ref 10–35)
Albumin: 4.8 g/dL (ref 3.6–5.1)
Alkaline phosphatase (APISO): 63 U/L (ref 37–153)
BUN/Creatinine Ratio: 22 (calc) (ref 6–22)
BUN: 24 mg/dL (ref 7–25)
CO2: 26 mmol/L (ref 20–32)
Calcium: 10.2 mg/dL (ref 8.6–10.4)
Chloride: 104 mmol/L (ref 98–110)
Creat: 1.07 mg/dL — ABNORMAL HIGH (ref 0.60–0.93)
GFR, Est African American: 61 mL/min/{1.73_m2} (ref >=60)
GFR, Est Non African American: 53 mL/min/{1.73_m2} — ABNORMAL LOW (ref >=60)
Globulin: 2 g/dL (calc) (ref 1.9–3.7)
Glucose, Bld: 159 mg/dL — ABNORMAL HIGH (ref 65–99)
Potassium: 4.1 mmol/L (ref 3.5–5.3)
Sodium: 139 mmol/L (ref 135–146)
Total Bilirubin: 0.4 mg/dL (ref 0.2–1.2)
Total Protein: 6.8 g/dL (ref 6.1–8.1)

## 2020-08-27 LAB — LIPID PANEL
Cholesterol: 191 mg/dL (ref <200)
HDL: 42 mg/dL — ABNORMAL LOW (ref >=50)
LDL Cholesterol (Calc): 100 mg/dL (calc) — ABNORMAL HIGH
Non-HDL Cholesterol (Calc): 149 mg/dL (calc) — ABNORMAL HIGH (ref <130)
Total CHOL/HDL Ratio: 4.5 (calc) (ref <5.0)
Triglycerides: 366 mg/dL — ABNORMAL HIGH (ref <150)

## 2020-08-27 LAB — FRUCTOSAMINE: Fructosamine: 228 umol/L (ref 205–285)

## 2020-08-27 LAB — VITAMIN D 25 HYDROXY (VIT D DEFICIENCY, FRACTURES): Vit D, 25-Hydroxy: 53 ng/mL (ref 30–100)

## 2020-08-27 LAB — TSH: TSH: 2.97 mIU/L (ref 0.40–4.50)

## 2020-08-27 LAB — MAGNESIUM: Magnesium: 2 mg/dL (ref 1.5–2.5)

## 2020-08-27 LAB — INSULIN, RANDOM: Insulin: 225.5 u[IU]/mL — ABNORMAL HIGH

## 2020-08-27 NOTE — Progress Notes (Signed)
========================================================== ==========================================================  -    Fructosamine finally returned and is 228   - which approximates an A1c of about 5.5% which is Normal - Great !   ========================================================== ==========================================================

## 2020-08-30 ENCOUNTER — Encounter: Payer: Medicare HMO | Admitting: Counselor

## 2020-08-31 DIAGNOSIS — D249 Benign neoplasm of unspecified breast: Secondary | ICD-10-CM | POA: Diagnosis not present

## 2020-09-05 ENCOUNTER — Ambulatory Visit
Admission: RE | Admit: 2020-09-05 | Discharge: 2020-09-05 | Disposition: A | Discharge status: Home / Self Care | Payer: Medicare Other | Source: Ambulatory Visit | Attending: Adult Health | Admitting: Adult Health

## 2020-09-05 ENCOUNTER — Other Ambulatory Visit: Payer: Self-pay

## 2020-09-05 DIAGNOSIS — M8589 Other specified disorders of bone density and structure, multiple sites: Secondary | ICD-10-CM | POA: Diagnosis not present

## 2020-09-05 DIAGNOSIS — M8580 Other specified disorders of bone density and structure, unspecified site: Secondary | ICD-10-CM

## 2020-09-05 DIAGNOSIS — Z0001 Encounter for general adult medical examination with abnormal findings: Secondary | ICD-10-CM

## 2020-09-05 DIAGNOSIS — Z780 Asymptomatic menopausal state: Secondary | ICD-10-CM | POA: Diagnosis not present

## 2020-09-14 ENCOUNTER — Ambulatory Visit: Payer: Medicare Other

## 2020-09-14 ENCOUNTER — Ambulatory Visit (INDEPENDENT_AMBULATORY_CARE_PROVIDER_SITE_OTHER): Payer: Medicare Other | Admitting: Counselor

## 2020-09-14 ENCOUNTER — Other Ambulatory Visit: Payer: Self-pay

## 2020-09-14 ENCOUNTER — Encounter: Payer: Self-pay | Admitting: Counselor

## 2020-09-14 DIAGNOSIS — R2689 Other abnormalities of gait and mobility: Secondary | ICD-10-CM | POA: Diagnosis not present

## 2020-09-14 DIAGNOSIS — F09 Unspecified mental disorder due to known physiological condition: Secondary | ICD-10-CM | POA: Diagnosis not present

## 2020-09-14 DIAGNOSIS — R442 Other hallucinations: Secondary | ICD-10-CM

## 2020-09-14 NOTE — Progress Notes (Deleted)
Subjective:    Patient ID: Kimberly Ray is a 70 y.o. female.  HPI {Common ambulatory SmartLinks:[id number]}  Review of Systems  Objective:  Neurological Exam  Physical Exam  Assessment:   ***  Plan:   ***
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
## 2020-09-14 NOTE — Progress Notes (Signed)
[facility]  Patient Name: Kimberly Ray MRN: [id number] Date of Birth: 1951/05/29 Age: 70 y.o. Education: 12 years  Measurement properties of test scores: IQ, Index, and Standard Scores (SS): Mean = 100; Standard Deviation = 15 Scaled Scores (Ss): Mean = 10; Standard Deviation = 3 Z scores (Z): Mean = 0; Standard Deviation = 1 T scores (T); Mean = 50; Standard Deviation = 10  TEST SCORES:    Note: This summary of test scores accompanies the interpretive report and should not be interpreted by unqualified individuals or in isolation without reference to the report. Test scores are relative to age, gender, and educational history as available and appropriate.   Performance Validity        "A" Random Letter Test Raw  Descriptor      Errors 0 Within Expectation  The Dot Counting Test: 17 Within Expectation  NAB Effort Index 1 Within Expectation      Mental Status Screening     Total Score Descriptor  MoCA 21 MCI      Expected Functioning        Wide Range Achievement Test: Standard/Scaled Score Percentile      Word Reading 102 55      Reynolds Intellectual Screening Test Standard/T-score Percentile      Guess What 57 75      Odd Item Out 56 73  RIST Index 112 79      Attention/Processing Speed        Neuropsychological Assessment Battery (Attention Module, Form 1): T-score Percentile      Digits Forward 43 25      Digits Backwards 46 34      Repeatable Battery for the Assessment of Neuropsychological Status (Form A): Scaled Score Percentile      Coding 7 16      Language        Neuropsychological Assessment Battery (Language Module, Form 1): T-score Percentile      Naming   (31) 59 82      Verbal Fluency: T-score Percentile      Controlled Oral Word Association (F-A-S) 40 16      Semantic Fluency (Animals) 45 31      Memory:        Neuropsychological Assessment Battery (Memory Module, Form 1): T-score Percentile       List Learning           List A Immediate Recall   (5, 7, 7) 46 34         List B Immediate Recall   (3) 45 31         List A Short Delayed Recall   (9) 60 84         List A Long Delayed Recall   (6) 48 42         List A Percent Retention   (67 %) --- 21         List A Long Delayed Yes/No Recognition Hits   (11) --- 58         List A Long Delayed Yes/No Recognition False Alarms   (4) --- 50         List A Recognition Discriminability Index --- 54      Story Learning           Immediate Recall   (20, 28) 39 14         Delayed Recall   (26) 42 21  Percent Retention   (93 %) --- 50      Daily Living Memory            Immediate Recall   (19, 14) 39 14          Delayed Recall   (8, 5) 48 42          Percent Retention (87 %) --- 54          Recognition Hits    (9) --- 62      Repeatable Battery for the Assessment of Neuropsychological Status (Form A): Scaled Score Percentile         Figure Recall   (8) 7 16      Visuospatial/Constructional Functioning        Repeatable Battery for the Assessment of Neuropsychological Status (Form A): Standard/Scaled Score Percentile     Visuospatial/Constructional Index 105 63         Figure Copy   (18) 10 50         Judgment of Line Orientation   (18) --- 51-75      Executive Functioning        Modified [employer] Test (MWCST): Standard/T-Score Percentile      Number of Categories Correct 29 2      Number of Perseverative Errors 35 7      Number of Total Errors 35 7      Percent Perseverative Errors 45 31  Executive Function Composite 70 2      Trail Making Test: T-Score Percentile      Part A 46 34      Part B 41 18      Boston Diagnostic Aphasia Exam: Raw Score Scaled Score      Complex Ideational Material 8 3      Clock Drawing Raw Score Descriptor      Command 7 Mild Impairment      Rating Scales         Raw Score Descriptor  Patient Health Questionnaire - 9 8 Mild  GAD-7 6 Mild       Raw Score Descriptor   Geriatric Depression Scale - Short Form 7 Positive   [name] V. Nicole Kindred PsyD, [place] Clinical Neuropsychologist

## 2020-09-14 NOTE — Progress Notes (Signed)
   Psychometrist Note   Cognitive testing was administered to [place] by Lamar Benes, B.S. (Technician) under the supervision of Alphonzo Severance, Psy.D., ABN. Ms. Hillmer was able to tolerate all test procedures. Dr. Nicole Kindred met with the patient as needed to manage any emotional reactions to the testing procedures. Rest breaks were offered.    The battery of tests administered was selected by Dr. Nicole Kindred with consideration to the patient's current level of functioning, the nature of her symptoms, emotional and behavioral responses during the interview, level of literacy, observed level of motivation/effort, and the nature of the referral question. This battery was communicated to the psychometrist. Communication between Dr. Nicole Kindred and the psychometrist was ongoing throughout the evaluation and Dr. Nicole Kindred was immediately accessible at all times. Dr. Nicole Kindred provided supervision to the technician on the date of this service, to the extent necessary to assure the quality of all services provided.    Ms. Statzer will return in approximately one week for an interactive feedback session with Dr. Nicole Kindred, at which time test performance, clinical impressions, and treatment recommendations will be reviewed in detail. The patient understands she can contact our office should she require our assistance before this time.   A total of 140 minutes of billable time were spent with Edwin Cap by the technician, including test administration and scoring time. Billing for these services is reflected in Dr. Les Pou note.   This note reflects time spent with the psychometrician and does not include test scores, clinical history, or any interpretations made by Dr. Nicole Kindred. The full report will follow in a separate note.

## 2020-09-17 ENCOUNTER — Telehealth: Payer: Self-pay | Admitting: Neurology

## 2020-09-17 NOTE — Telephone Encounter (Signed)
She had neuropsychological evaluation with Dr. Nicole Kindred, felt the issue was more related to executive control function, could be due to depression, aging, attention deficit disorder.  Felt neurodegeneration is less likely, can follow-up in 1 to 2 years.

## 2020-09-17 NOTE — Progress Notes (Signed)
[facility]  Patient Name: Boston Catarino MRN: [id number] Date of Birth: 1951/02/19 Age: 70 y.o. Education: 4 years  Clinical Impressions  Eylin Pontarelli is a 70 y.o., right-hand dominant, divorced woman with a history of seasonal affective disorder vs. depressive disorder, HTN, balance/movement problems, and concerns about memory and thinking since around 2014. She follows with Butler Denmark, NP at [facility] who referred her for evaluation. Her day-to-day symptoms mainly involve absentmindedness such as misplacing things, putting things away in the wrong location, and she also feels as though tasks requiring judgment and problem solving take longer than they did in the past. She admits she has always had some difficulties in that regard and believes that she has ADHD. She was apparently told by a psychiatrist that she has ADHD many years ago after relating her history. She has an MRI that shows only mild volume loss, not surpassing expectations in the setting of involutional changes, and some mild white matter changes mainly in the pons.   Ms. Rigsbee's neuropsychological test performance suggests no more than perhaps very mild cognitive decrements, and given her history of possible ADHD, I am unable to tell if there is any significant decline at all. She did perform just below the level of expectation for her on memory measures but that is due to marginal, low average encoding scores and she retained information across time well in all cases. The profile is thus unconcerning for a storage issue. She also had unusually low performance on the Executive Function Composite of the [place] Task and an extremely low score when reasoning with complex verbal information. My sense is that all these findings point to some executive control issues. She had no concerning higher cortical signs typically associated with Lewy Body Disease or  Alzheimer's disease, with intact visuospatial functioning, visual object confrontation naming, and good semantic fluency. She screened in the mild range for depressive symptoms but she did demonstrate an idiosyncratic approach to those questionnaires that renders them less valid if not inaccurate.  Ms. Marland is thus demonstrating some mild difficulties with executive control, that are attentuating her memory performance and go well with her day-to-day complaints. The possibility that this may be a prodromal condition cannot be entirely ruled out, but there are other potential explanations. One possibility is that this simply reflects the patient at her baseline given her history of ADHD. I also wonder if there isn't some mild decline due to aging in the setting of ADHD and depressive disorder. Nevertheless, it would be prudent for Ms. Shockley to return in 1 to 2 years for follow up and to make sure nothing is being missed.   Diagnostic Impressions: Attention-deficit/hyperactivity disorder Major depressive disorder, recurrent, moderate Other symptoms and signs involving cognitive functions and awareness  Recommendations to be discussed with patient  Your performance and presentation on assessment were consistent with very close to normal performance. You did have a few scattered low scores on measures of memory and on measures of executive function. I think that the primary difficulty, in your case, is what is called an executive control problem and that is attentuating your memory performance.   Executive control is a higher order cognitive ability involved in regulating other cognitive resources. Much like the conductor of an orchestra coordinates multiple instruments to make music, executive capacities coordinate other lower-order skills (e.g., movement, language, attention) to form complex human behaviors. Individuals with executive control problems are often capable of doing most of the  things they did  before they were having problems, but they may not do so as effortlessly, efficiently, and consistently. These difficulties often manifest as problems tracking information, multitasking, and paying attention. Executive control problems often result in cognitive inefficiency and can present as "memory problems," because they decrease encoding and spontaneous retrieval of information.   First, I am not even certain that this necessarily represents a decline for you. Individuals with ADHD, which you believe you have and which you were diagnosed with in the past, often have these types of cognitive difficulties on testing. So one possibility is that this is simply the way you have always been.   A second possibility is that there has been some subtle decline as you have aged. Aging in the setting of mental illness (such as depressive disorders) and ADHD can sometimes result in mild levels of measurable cognitive difficultiy.   I cannot rule out the possibility that there is something else going on entirely but at this point, I think that is less likely. You may wish to follow up for re-evaluation in 1 to 2 years to recheck your functioning and to make sure nothing is being missed but there is no cause for significant concern at present.   Avoid overfocusing on cognitive performance. Memory and cognition are notoriously fallible and if you are looking for cognitive problems, you are bound to find them. Once someone gets worried about their memory and thinking, they may overfocus on how they are doing day-to-day, and then when normal day-to-day cognitive errors are made, this becomes a cause for more concern. This concern and anxiety then decreases focus from the task at hand, reducing concentration, causing more cognitive problems, and creating a vicious cycle. Rather than critiquing your performance, I would encourage you to remain present minded and focus on the task at hand. Perhaps most importantly, have  reasonable expectations for yourself.  Healthy people forget things, lose focus, and do not perform 100% correctly all the time. Some cognitive errors are normal and are not necessarily a sign that there is something wrong with your brain.   For your day-to-day problems, I would recommend that you consider compensatory strategies. This includes things like using a weekly pill minder that can help you remember your medications, using a calendar to track appointments, putting finances on autopay to make sure bills are not missed, and also depending on structure and routine.   For problems with attention and concentration, consider the following recommendations: . Build routines into your day and stick to them, doing the same thing at the same time helps your body get into a rhythm that makes it harder to forget something you need to do.  . Use sticky notes, reminders, a calendar, or your smart phone to provide yourself with reminders, to do lists, and help track appointments.  . When you need to do work for school or other things, create an environment that is conducive to that work. This may include putting electronic devices that can be distracting outside the room and working in an area that is quiet and free from distractions.  . Break things down into smaller steps to help get started and stop yourself from feeling overwhelmed.  . Plan breaks throughout the day where you can get up and move even if it's for just a few minutes at a time.  . Focus your attention on only one thing and avoid multitasking. Although some people are better at it than others, nobody's task performance  is as good as it could be when they are alternating attention between two different tasks.  . Stay mindful throughout your day and monitor whether you are feeling overwhelmed or disorganized to identify problems before they happen.  . Avoid working under time pressure when you may be more liable to make mistakes.   One  treatment that might be helpful for getting your depression under better control and for your memory and thinking difficulties is psychotherapy. We can discuss a referral.   Test Findings  Test scores are summarized in additional documentation associated with this encounter. Test scores are relative to age, gender, and educational history as available and appropriate. There were no concerns about performance validity. There was one marginal score on a standalone measure although she performed entirely within normal limits on two other similar metrics. My sense is that the low score reflects attentional variability as opposed to any validity problem per se.   General Intellectual Functioning/Achievement:  Performance on single word reading was average. By contrast, she scored at a high average level on the RIST index with comparable performance on the verbally and visually mediated tasks. This was used as a standard of comparison for her cognitive test scores.   Attention and Processing Efficiency: Performance on indicators of attention and working memory was reasonable with a low average score on digit repetition forward and a high average score on digit repetition backward. With respect to processing speed, timed number-symbol coding was low average and simple numeric sequencing was average.   Language: Language findings showed normal errorless visual object confrontation naming. Generation of words was low average in response to given letters and average in response to the category prompt animals.   Visuospatial Function: Performance on visuospatial and constructional measures was average. Figure copy was average. Judgment of line orientations was average to high average.   Learning and Memory: Performance on measures of learning and memory was slightly below expectation; however, the pattern of low scores is not concerning for memory storage problems and instead shows inconsistent memory  encoding that may be on the basis of executive issues. Overall she was able to retain information across time well.   In the verbal realm, immediate recall for a 12-item word list was good with 5, 7, and 7 words across three learning trials, generating an average score. Short delayed recall for the word list was high average and delayed recall was average. She performed at a similar average level on delayed yes/no recognition for the word list. Memory for a short story was low average on immediate recall and delayed recall was low average. Memory for brief daily-living type information was low average and delayed recall was average. Recognition of that information contained amongst false choices was average.   In the visual realm, delayed recall for a modestly complex geometric figure was low average.   Executive Functions: Performance on executive measures was mixed with several scores below expectations. Her memory profile is also likely reduced on the basis of executive interference and therefore, findings are viewed as consistent with some mild executive control difficulties. She performed at an unusually low level on the Executive Function Composite of the [employer]. Alternating sequencing of numbers and letters of the alphabet was low average. Performance when reasoning with verbal information was extremely low on the Complex Ideational Material. Clock drawing suggested mild impairment, with missing numbers and incorrect placement of the clock hands.   Rating Scale(s): Ms. Gargus reported mild levels of  depressive symptoms. She interpreted these measures in an idiosyncractic and somewhat literal fashion, however, and as such they may not be valid indicators of her actual distress level. She certainly appeared upset throughout much of the interview and endorsed increased depressed mood over the past few months, although she stated that is not like her. She also screened  positive for depression on the QDRS.   Viviano Simas Nicole Kindred PsyD, [place] Clinical Neuropsychologist

## 2020-09-19 ENCOUNTER — Other Ambulatory Visit: Payer: Self-pay | Admitting: Adult Health

## 2020-09-19 ENCOUNTER — Other Ambulatory Visit: Payer: Self-pay | Admitting: Internal Medicine

## 2020-09-19 DIAGNOSIS — G2581 Restless legs syndrome: Secondary | ICD-10-CM

## 2020-09-19 DIAGNOSIS — E782 Mixed hyperlipidemia: Secondary | ICD-10-CM

## 2020-09-19 MED ORDER — GABAPENTIN 300 MG PO CAPS: ORAL_CAPSULE | ORAL | 0 refills | Status: DC

## 2020-09-19 MED ORDER — EZETIMIBE 10 MG PO TABS: ORAL_TABLET | ORAL | 0 refills | Status: DC

## 2020-09-19 MED ORDER — GABAPENTIN 300 MG PO CAPS: ORAL_CAPSULE | ORAL | 1 refills | Status: DC

## 2020-09-19 MED ORDER — FENOFIBRATE 145 MG PO TABS: ORAL_TABLET | ORAL | 0 refills | Status: DC

## 2020-09-20 ENCOUNTER — Other Ambulatory Visit: Payer: Self-pay | Admitting: Adult Health

## 2020-09-20 MED ORDER — SUCRALFATE 1 G PO TABS: 1.0000 g | ORAL_TABLET | Freq: Three times a day (TID) | ORAL | 1 refills | Status: DC

## 2020-09-21 ENCOUNTER — Other Ambulatory Visit: Payer: Self-pay | Admitting: Internal Medicine

## 2020-09-21 ENCOUNTER — Encounter: Payer: Self-pay | Admitting: Counselor

## 2020-09-21 ENCOUNTER — Other Ambulatory Visit: Payer: Self-pay

## 2020-09-21 ENCOUNTER — Ambulatory Visit (INDEPENDENT_AMBULATORY_CARE_PROVIDER_SITE_OTHER): Payer: Medicare Other | Admitting: Counselor

## 2020-09-21 DIAGNOSIS — E782 Mixed hyperlipidemia: Secondary | ICD-10-CM

## 2020-09-21 DIAGNOSIS — G2581 Restless legs syndrome: Secondary | ICD-10-CM

## 2020-09-21 DIAGNOSIS — F339 Major depressive disorder, recurrent, unspecified: Secondary | ICD-10-CM | POA: Diagnosis not present

## 2020-09-21 DIAGNOSIS — K219 Gastro-esophageal reflux disease without esophagitis: Secondary | ICD-10-CM

## 2020-09-21 MED ORDER — GABAPENTIN 300 MG PO CAPS: ORAL_CAPSULE | ORAL | 0 refills | Status: DC

## 2020-09-21 MED ORDER — FENOFIBRATE 145 MG PO TABS
ORAL_TABLET | ORAL | 0 refills | Status: DC
Start: 2020-09-21 — End: 2020-09-24

## 2020-09-21 MED ORDER — SUCRALFATE 1 G PO TABS: ORAL_TABLET | ORAL | 0 refills | Status: DC

## 2020-09-21 MED ORDER — EZETIMIBE 10 MG PO TABS: ORAL_TABLET | ORAL | 0 refills | Status: DC

## 2020-09-21 MED ORDER — FAMOTIDINE 20 MG PO TABS: ORAL_TABLET | ORAL | 0 refills | Status: DC

## 2020-09-21 NOTE — Progress Notes (Signed)
[facility]  Feedback Note: I met with Kimberly Ray to review the findings resulting from her neuropsychological evaluation. Since the last appointment, she has been well. Unfortunately her car would not start, and she was dealing with that, but she overall feels like she has been more up beat. She stopped the ropinirole and thinks she is doing better since then. Time was spent reviewing the impressions and recommendations that are detailed in the evaluation report. We discussed impression of very mild executive control problems that may not even represent a decline for her. I wonder about the dopamine agonists with respect to her visual illusions, which do not sound like what I would expect with an early synucleinopathy. Nevertheless, it would be prudent for her to return for follow up evaluation in 2 years or so to make sure there is nothing being missed. Other topics of conversation as reflected in the patient instructions. I took time to explain the findings and answer all the patient's questions. I encouraged Ms. Guimond to contact me should she have any further questions or if further follow up is desired.   Current Medications and Medical History   Current Outpatient Medications  Medication Sig Dispense Refill  . acetaminophen (TYLENOL) 650 MG CR tablet Take 650 mg by mouth every 8 (eight) hours as needed for pain.    . Ascorbic Acid (VITAMIN C) 100 MG tablet Take 100 mg by mouth daily.    Marland Kitchen buPROPion (WELLBUTRIN XL) 300 MG 24 hr tablet TAKE ONE TABLET DAILY FOR MOOD 90 tablet 1  . cholecalciferol (VITAMIN D3) 25 MCG (1000 UNIT) tablet Take 4,000 Units by mouth daily.     . Coenzyme Q10 (CO Q 10 PO) Take by mouth.    . ezetimibe (ZETIA) 10 MG tablet Take  1 tablet  Daily  for Cholesterol 90 tablet 0  . famotidine (PEPCID) 20 MG tablet TAKE ONE TABLET BY MOUTH TWICE A DAY FOR HEARTBURN AND INDIGESTION 180 tablet 0  . fenofibrate (TRICOR) 145 MG  tablet Take  1 tablet  Daily  for Triglycerides (Blood Fats) 90 tablet 0  . gabapentin (NEURONTIN) 300 MG capsule Take  1 capsule  1 hour  before Bedtime  as needed for  Restless Legs & Sleep 90 capsule 0  . lamoTRIgine (LAMICTAL) 100 MG tablet Take 1 tablet Daily to Stabilize Mood 90 tablet 0  . Omega-3 1000 MG CAPS Take by mouth.    . ondansetron (ZOFRAN-ODT) 8 MG disintegrating tablet DISSOLVE ONE TABLET BY MOUTH EVERY 8 HOURS AS NEEDED FOR NAUSEA AND VOMITING 60 tablet 0  . sucralfate (CARAFATE) 1 g tablet Take 1 tablet (1 g total) by mouth 4 (four) times daily -  with meals and at bedtime. 120 tablet 1  . SUMAtriptan (IMITREX) 25 MG tablet TAKE 1-2 TABLETS BY MOUTH AT ONSET OF MIGRAINE MAY REPEAT IN 2 HOURS IF HEADACHE PERSISTS MAX OF 4 TABLETS PER 24 HR 30 tablet 0   No current facility-administered medications for this visit.    Patient Active Problem List   Diagnosis Date Noted  . B12 deficiency 04/12/2020  . Hallucinations (visual and olfactory) 01/03/2020  . Gait abnormality 12/26/2019  . RLS (restless legs syndrome) 12/26/2019  . Memory changes 08/04/2019  . CKD (chronic kidney disease) stage 3, GFR 30-59 ml/min (HCC) 04/11/2019  . Osteopenia 04/06/2018  . Fatty liver 08/29/2017  . Circadian rhythm sleep disorder 03/25/2017  . Periodic Ray movement disorder (PLMD) 03/25/2017  . Overweight (  BMI 25.0-29.9) 06/16/2015  . Medication management 06/15/2015  . Other abnormal glucose (prediabetes) 11/09/2014  . Vitamin D deficiency 11/09/2014  . GERD  11/09/2014  . Major depression, recurrent, chronic ([place]) 06/07/2014  . OSA on CPAP   . Mixed hyperlipidemia 06/29/2013  . ADD (attention deficit disorder) 06/29/2013    Mental Status and Behavioral Observations  Kimberly Ray presented on time to the present encounter and was alert and generally oriented. Speech was normal in rate, rhythm, volume, and prosody. Self-reported mood was "better" and affect was bright. Thought  process was logical and goal oriented, although she did lose her train of thought at times and thought content was appropriate. There were no safety concerns identified at today's encounter, such as thoughts of harming self or others.   Plan  Feedback provided regarding the patient's neuropsychological evaluation. She presented as very appreciative of our conversation. She accepted a referral for [place]. I also referred her to elder law, which she has been putting off, and we discussed diet and exercise as ways to enhance cognitive longevity. Kimberly Ray was encouraged to contact me if any questions arise or if further follow up is desired.   Viviano Simas Nicole Kindred, PsyD, ABN Clinical Neuropsychologist  Service(s) Provided at This Encounter: 42 minutes [phone]; Psychotherapy with patient/family)

## 2020-09-21 NOTE — Patient Instructions (Addendum)
Your performance and presentation on assessment were consistent with very close to normal performance. You did have a few scattered low scores on measures of memory and on measures of executive function. I think that the primary difficulty, in your case, is what is called an executive control problem and that is attentuating your memory performance.   Executive control is a higher order cognitive ability involved in regulating other cognitive resources. Much like the conductor of an orchestra coordinates multiple instruments to make music, executive capacities coordinate other lower-order skills (e.g., movement, language, attention) to form complex human behaviors. Individuals with executive control problems are often capable of doing most of the things they did before they were having problems, but they may not do so as effortlessly, efficiently, and consistently. These difficulties often manifest as problems tracking information, multitasking, and paying attention. Executive control problems often result in cognitive inefficiency and can present as "memory problems," because they decrease encoding and spontaneous retrieval of information.   First, I am not even certain that this necessarily represents a decline for you. Individuals with ADHD, which you believe you have and which you were diagnosed with in the past, often have these types of cognitive difficulties on testing. So one possibility is that this is simply the way you have always been.   A second possibility is that there has been some subtle decline as you have aged. Aging in the setting of mental illness (such as depressive disorders) and ADHD can sometimes result in mild levels of measurable cognitive difficultiy.   I cannot rule out the possibility that there is something else going on entirely but at this point, I think that is less likely. You may wish to follow up for re-evaluation in 1 to 2 years to recheck your functioning and to  make sure nothing is being missed but there is no cause for significant concern at present.   Avoid overfocusing on cognitive performance. Memory and cognition are notoriously fallible and if you are looking for cognitive problems, you are bound to find them. Once someone gets worried about their memory and thinking, they may overfocus on how they are doing day-to-day, and then when normal day-to-day cognitive errors are made, this becomes a cause for more concern. This concern and anxiety then decreases focus from the task at hand, reducing concentration, causing more cognitive problems, and creating a vicious cycle. Rather than critiquing your performance, I would encourage you to remain present minded and focus on the task at hand. Perhaps most importantly, have reasonable expectations for yourself.  Healthy people forget things, lose focus, and do not perform 100% correctly all the time. Some cognitive errors are normal and are not necessarily a sign that there is something wrong with your brain.   For your day-to-day problems, I would recommend that you consider compensatory strategies. This includes things like using a weekly pill minder that can help you remember your medications, using a calendar to track appointments, putting finances on autopay to make sure bills are not missed, and also depending on structure and routine.   For problems with attention and concentration, consider the following recommendations:  Build routines into your day and stick to them, doing the same thing at the same time helps your body get into a rhythm that makes it harder to forget something you need to do.   Use sticky notes, reminders, a calendar, or your smart phone to provide yourself with reminders, to do lists, and help track appointments.  When you need to do work for school or other things, create an environment that is conducive to that work. This may include putting electronic devices that can be  distracting outside the room and working in an area that is quiet and free from distractions.   Break things down into smaller steps to help get started and stop yourself from feeling overwhelmed.   Plan breaks throughout the day where you can get up and move even if it's for just a few minutes at a time.   Focus your attention on only one thing and avoid multitasking. Although some people are better at it than others, nobody's task performance is as good as it could be when they are alternating attention between two different tasks.   Stay mindful throughout your day and monitor whether you are feeling overwhelmed or disorganized to identify problems before they happen.   Avoid working under time pressure when you may be more liable to make mistakes.   There is now good quality evidence from at least one large scale study that a modified mediterranean diet may help slow cognitive decline. This is known as the "MIND" diet. The Mind diet is not so much a specific diet as it is a set of recommendations for things that you should and should not eat.   Foods that are ENCOURAGED on the MIND Diet:  Green, leafy vegetables: Aim for six or more servings per week. This includes kale, spinach, cooked greens and salads.  All other vegetables: Try to eat another vegetable in addition to the green leafy vegetables at least once a day. It is best to choose non-starchy vegetables because they have a lot of nutrients with a low number of calories.  Berries: Eat berries at least twice a week. There is a plethora of research on strawberries, and other berries such as blueberries, raspberries and blackberries have also been found to have antioxidant and brain health benefits.  Nuts: Try to get five servings of nuts or more each week. The creators of the [place] don't specify what kind of nuts to consume, but it is probably best to vary the type of nuts you eat to obtain a variety of nutrients. Peanuts are a legume  and do not fall into this category.  Olive oil: Use olive oil as your main cooking oil. There may be other heart-healthy alternatives such as algae oil, though there is not yet sufficient research upon which to base a formal recommendation.  Whole grains: Aim for at least three servings daily. Choose minimally processed grains like oatmeal, quinoa, brown rice, whole-wheat pasta and 100% whole-wheat bread.  Fish: Eat fish at least once a week. It is best to choose fatty fish like salmon, sardines, trout, tuna and mackerel for their high amounts of omega-3 fatty acids.  Beans: Include beans in at least four meals every week. This includes all beans, lentils and soybeans.  Poultry: Try to eat chicken or [place] at least twice a week. Note that fried chicken is not encouraged on the MIND diet.  Wine: Aim for no more than one glass of alcohol daily. Both red and white wine may benefit the brain. However, much research has focused on the red wine compound resveratrol, which may help protect against Alzheimer's disease.  Foods that are DISCOURAGED on the MIND Diet: Butter and margarine: Try to eat less than 1 tablespoon (about 14 grams) daily. Instead, try using olive oil as your primary cooking fat, and dipping your bread  in olive oil with herbs.  Cheese: The MIND diet recommends limiting your cheese consumption to less than once per week.  Red meat: Aim for no more than three servings each week. This includes all beef, pork, lamb and products made from these meats.  [place] food: The MIND diet highly discourages fried food, especially the kind from fast-food restaurants. Limit your consumption to less than once per week.  Pastries and sweets: This includes most of the processed junk food and desserts you can think of. Ice cream, cookies, brownies, snack cakes, donuts, candy and more. Try to limit these to no more than four times a week.  One treatment that might be helpful for getting your depression under  better control and for your memory and thinking difficulties is psychotherapy. We can discuss a referral.   As with all capable adults, I would recommend that you consult with an elder care attorney regarding advanced directives if you have not, such as a healthcare power of attorney and living will. Consultation with an elder care attorney can help you protect your estate and communicate your preferences to your loved ones formally, in the event you are not able to do so yourself. I can provide my strong recommendation for the [employer], who are located at [address] in [place], [place]. They can be reached at [phone]. They also have a website [email or web address].

## 2020-09-24 ENCOUNTER — Telehealth: Payer: Self-pay | Admitting: Psychiatry

## 2020-09-24 ENCOUNTER — Other Ambulatory Visit: Payer: Self-pay | Admitting: Internal Medicine

## 2020-09-24 DIAGNOSIS — F319 Bipolar disorder, unspecified: Secondary | ICD-10-CM

## 2020-09-24 DIAGNOSIS — E782 Mixed hyperlipidemia: Secondary | ICD-10-CM

## 2020-09-24 DIAGNOSIS — F39 Unspecified mood [affective] disorder: Secondary | ICD-10-CM

## 2020-09-24 MED ORDER — LAMOTRIGINE 100 MG PO TABS: ORAL_TABLET | ORAL | 0 refills | Status: DC

## 2020-09-24 MED ORDER — LAMOTRIGINE 100 MG PO TABS
ORAL_TABLET | ORAL | 0 refills | Status: DC
Start: 2020-09-24 — End: 2020-09-24

## 2020-09-24 MED ORDER — FENOFIBRATE 145 MG PO TABS
ORAL_TABLET | ORAL | 0 refills | Status: DC
Start: 2020-09-24 — End: 2020-09-27

## 2020-09-24 MED ORDER — BUPROPION HCL ER (XL) 300 MG PO TB24: ORAL_TABLET | ORAL | 0 refills | Status: DC

## 2020-09-24 NOTE — Telephone Encounter (Signed)
Received faxed refill request from [facility] for Lamictal. Script sent.

## 2020-09-26 ENCOUNTER — Other Ambulatory Visit: Payer: Medicare Other

## 2020-09-26 DIAGNOSIS — Z20822 Contact with and (suspected) exposure to covid-19: Secondary | ICD-10-CM

## 2020-09-27 ENCOUNTER — Other Ambulatory Visit: Payer: Self-pay | Admitting: Internal Medicine

## 2020-09-27 DIAGNOSIS — F39 Unspecified mood [affective] disorder: Secondary | ICD-10-CM

## 2020-09-27 DIAGNOSIS — F319 Bipolar disorder, unspecified: Secondary | ICD-10-CM

## 2020-09-27 DIAGNOSIS — E782 Mixed hyperlipidemia: Secondary | ICD-10-CM

## 2020-09-27 DIAGNOSIS — G2581 Restless legs syndrome: Secondary | ICD-10-CM

## 2020-09-27 LAB — SARS-COV-2, NAA 2 DAY TAT

## 2020-09-27 LAB — NOVEL CORONAVIRUS, NAA: SARS-CoV-2, NAA: NOT DETECTED

## 2020-10-03 ENCOUNTER — Ambulatory Visit (INDEPENDENT_AMBULATORY_CARE_PROVIDER_SITE_OTHER): Payer: Medicare Other | Admitting: Adult Health

## 2020-10-03 ENCOUNTER — Encounter: Payer: Self-pay | Admitting: Adult Health

## 2020-10-03 ENCOUNTER — Other Ambulatory Visit: Payer: Self-pay

## 2020-10-03 VITALS — BP 114/70 | HR 85 | Temp 97.3°F | Wt 183.0 lb

## 2020-10-03 DIAGNOSIS — K219 Gastro-esophageal reflux disease without esophagitis: Secondary | ICD-10-CM

## 2020-10-03 DIAGNOSIS — R1013 Epigastric pain: Secondary | ICD-10-CM | POA: Diagnosis not present

## 2020-10-03 MED ORDER — OMEPRAZOLE 20 MG PO CPDR: 20.0000 mg | DELAYED_RELEASE_CAPSULE | Freq: Two times a day (BID) | ORAL | 0 refills | Status: DC

## 2020-10-03 NOTE — Progress Notes (Signed)
Assessment and Plan:  Jari was seen today for gi problem.  Diagnoses and all orders for this visit:  Epigastric pain/ Gastroesophageal reflux disease, unspecified whether esophagitis present Check H. Pylori, r/o prior to PPI Initiated medication for new reflux related symptoms: omeprazole 20 mg BID instead of famotidine, continue carafate, add pepto bismol PRN Discussed diet, avoiding triggers, all NSAIDs, caffeine, ETOH and other lifestyle changes Follow up 2 weeks; sooner if not improving,  -     CBC with Differential/Platelet -     COMPLETE METABOLIC PANEL WITH GFR -     H. pylori breath test -     omeprazole (PRILOSEC) 20 MG capsule; Take 1 capsule (20 mg total) by mouth 2 (two) times daily before a meal. Then reduce to once daily.  Further disposition pending results of labs. Discussed med's effects and SE's.   Over 20 minutes of exam, counseling, chart review, and critical decision making was performed.   Future Appointments  Date Time Provider [place]  10/22/2020 10:30 AM Liane Comber, NP [facility] None  10/25/2020  1:15 PM [name], NP [facility] None  11/29/2020  2:30 PM Liane Comber, NP [facility] None  04/17/2021 10:00 AM Liane Comber, NP [facility] None    ------------------------------------------------------------------------------------------------------------------   HPI BP 114/70   Pulse 85   Temp (!) 97.3 F (36.3 C)   Wt 183 lb (83 kg)   SpO2 97%   BMI 28.66 kg/m   70 y.o.female with hx of GERD and gastric ulcers presents for evaluation reporting worsening acid reflux.   She has been taking famotidine 20 mg BID for several years which had controlled sx reasonably but with some consistent breakthrough, progressive, then in the last 2 weeks has had severe breakthrough. Has been taking carafate as well intermittently. Will wake up intermittently nauseated in the AM, during the day will have sensation that she needs to throw up, hasn't  had emesis. She also reports significant breakthrough reflux/burning after each meal, not with water, worse with trigger foods (pepper, garlic, tomato sauce, etc). Has noted some mild intermittent upper abdominal gnawing discomfort associated with meals. Denies constipation or diarrhea. Denies fever/chills. Denies blood in stool, black stools. Does endorse belching, denies bloating/gassiness.   She denies alcohol intake, denies NSAID use other than low dose baby aspirin but stopped 1 month ago. She denies taking other meds for GERD, pepto-bismol. Was taking fiber and probiotic but stopped a few weeks back.   She had EGD by Dr. Watt Climes 08/2017 showed small hiatal hernia, normal stomach    CBC Latest Ref Rng & Units 08/23/2020 04/11/2020 10/27/2019  WBC 3.8 - 10.8 Thousand/uL 5.5 5.3 5.0  Hemoglobin 11.7 - 15.5 g/dL 13.1 12.4 13.2  Hematocrit 35.0 - 45.0 % 39.8 38.4 42.0  Platelets 140 - 400 Thousand/uL 319 286 317   Lab Results  Component Value Date   IRON 64 04/11/2020   TIBC 530 (H) 04/06/2018   FERRITIN 55 04/11/2020      Past Medical History:  Diagnosis Date  . Adnexal mass 04/08/2020  . Anemia   . Depression   . Extrinsic asthma 11/09/2014  . Gait abnormality 12/26/2019  . GERD (gastroesophageal reflux disease)   . Hay fever   . Head trauma   . IBS (irritable bowel syndrome)   . Memory loss   . Migraines   . OSA on CPAP   . Reflux   . RLS (restless legs syndrome)   . Sinus congestion  Allergies  Allergen Reactions  . Erythromycin Other (See Comments)    TBS  . Iodinated Diagnostic Agents Other (See Comments)    Hot flashes, itching scalp Hot flashes, itching scalp  . Penicillins Hives  . Clarithromycin Other (See Comments)    Itching ankles  . Brintellix [Vortioxetine]     Itching    Current Outpatient Medications on File Prior to Visit  Medication Sig  . acetaminophen (TYLENOL) 650 MG CR tablet Take 650 mg by mouth every 8 (eight) hours as needed for pain.  .  Ascorbic Acid (VITAMIN C) 100 MG tablet Take 100 mg by mouth daily.  Marland Kitchen buPROPion (WELLBUTRIN XL) 300 MG 24 hr tablet Take  1 tablet  Daily for Mood  . cholecalciferol (VITAMIN D3) 25 MCG (1000 UNIT) tablet Take 4,000 Units by mouth daily.   . Coenzyme Q10 (CO Q 10 PO) Take by mouth.  . ezetimibe (ZETIA) 10 MG tablet Take  1 tablet  Daily  for Cholesterol  . famotidine (PEPCID) 20 MG tablet Take  1 tablet  2 x/day  with Meals  to Prevent Indigestion & Heartburn  . fenofibrate (TRICOR) 145 MG tablet Take  1 tablet  Daily  for Triglycerides (Blood Fats)  . gabapentin (NEURONTIN) 300 MG capsule Take 1 capsule 1 hour  before Bedtime  as needed for Restless Legs & Sleep  . lamoTRIgine (LAMICTAL) 100 MG tablet Take  1 tablet  Daily  to Stabilize Mood  . Omega-3 1000 MG CAPS Take by mouth.  . ondansetron (ZOFRAN-ODT) 8 MG disintegrating tablet DISSOLVE ONE TABLET BY MOUTH EVERY 8 HOURS AS NEEDED FOR NAUSEA AND VOMITING  . sucralfate (CARAFATE) 1 g tablet Take  1 tablet  4 x /day  before Meals & Bedtime  . SUMAtriptan (IMITREX) 25 MG tablet TAKE 1-2 TABLETS BY MOUTH AT ONSET OF MIGRAINE MAY REPEAT IN 2 HOURS IF HEADACHE PERSISTS MAX OF 4 TABLETS PER 24 HR   No current facility-administered medications on file prior to visit.    ROS: all negative except above.   Physical Exam:  BP 114/70   Pulse 85   Temp (!) 97.3 F (36.3 C)   Wt 183 lb (83 kg)   SpO2 97%   BMI 28.66 kg/m   General Appearance: Well nourished, in no apparent distress. Eyes: PERRLA, EOMs, conjunctiva no swelling or erythema Sinuses: No Frontal/maxillary tenderness ENT/Mouth: Ext aud canals clear, TMs without erythema, bulging. No erythema, swelling, or exudate on post pharynx.  Tonsils not swollen or erythematous. Hearing normal.  Neck: Supple, thyroid normal.  Respiratory: Respiratory effort normal, BS equal bilaterally without rales, rhonchi, wheezing or stridor.  Cardio: RRR with no MRGs. Brisk peripheral pulses without  edema.  Abdomen: Soft, + BS.  Non tender, no guarding, rebound, hernias, masses. Lymphatics: Non tender without lymphadenopathy.  Musculoskeletal: Full ROM, 5/5 strength, normal gait.  Skin: Warm, dry without rashes, lesions, ecchymosis.  Neuro: Cranial nerves intact. Normal muscle tone, no cerebellar symptoms. Sensation intact.  Psych: Awake and oriented X 3, normal affect, Insight and Judgment appropriate.     Izora Ribas, NP 3:24 PM [facility]

## 2020-10-03 NOTE — Patient Instructions (Signed)
Food Choices for Gastroesophageal Reflux Disease, Adult When you have gastroesophageal reflux disease (GERD), the foods you eat and your eating habits are very important. Choosing the right foods can help ease your discomfort. Think about working with a food expert (dietitian) to help you make good choices. What are tips for following this plan? Reading food labels  Look for foods that are low in saturated fat. Foods that may help with your symptoms include: ? Foods that have less than 5% of daily value (DV) of fat. ? Foods that have 0 grams of trans fat. Cooking  Do not fry your food.  Cook your food by baking, steaming, grilling, or broiling. These are all methods that do not need a lot of fat for cooking.  To add flavor, try to use herbs that are low in spice and acidity. Meal planning  Choose healthy foods that are low in fat, such as: ? Fruits and vegetables. ? Whole grains. ? Low-fat dairy products. ? Lean meats, fish, and poultry.  Eat small meals often instead of eating 3 large meals each day. Eat your meals slowly in a place where you are relaxed. Avoid bending over or lying down until 2-3 hours after eating.  Limit high-fat foods such as fatty meats or fried foods.  Limit your intake of fatty foods, such as oils, butter, and shortening.  Avoid the following as told by your doctor: ? Foods that cause symptoms. These may be different for different people. Keep a food diary to keep track of foods that cause symptoms. ? Alcohol. ? Drinking a lot of liquid with meals. ? Eating meals during the 2-3 hours before bed.   Lifestyle  Stay at a healthy weight. Ask your doctor what weight is healthy for you. If you need to lose weight, work with your doctor to do so safely.  Exercise for at least 30 minutes on 5 or more days each week, or as told by your doctor.  Wear loose-fitting clothes.  Do not smoke or use any products that contain nicotine or tobacco. If you need help  quitting, ask your doctor.  Sleep with the head of your bed higher than your feet. Use a wedge under the mattress or blocks under the bed frame to raise the head of the bed.  Chew sugar-free gum after meals. What foods should eat? Eat a healthy, well-balanced diet of fruits, vegetables, whole grains, low-fat dairy products, lean meats, fish, and poultry. Each person is different. Foods that may cause symptoms in one person may not cause any symptoms in another person. Work with your doctor to find foods that are safe for you. The items listed above may not be a complete list of what you can eat and drink. Contact a food expert for more options.   What foods should I avoid? Limiting some of these foods may help in managing the symptoms of GERD. Everyone is different. Talk with a food expert or your doctor to help you find the exact foods to avoid, if any. Fruits Any fruits prepared with added fat. Any fruits that cause symptoms. For some people, this may include citrus fruits, such as oranges, grapefruit, pineapple, and lemons. Vegetables Deep-fried vegetables. French fries. Any vegetables prepared with added fat. Any vegetables that cause symptoms. For some people, this may include tomatoes and tomato products, chili peppers, onions and garlic, and horseradish. Grains Pastries or quick breads with added fat. Meats and other proteins High-fat meats, such as fatty beef or pork,   hot dogs, ribs, ham, sausage, salami, and bacon. Fried meat or protein, including fried fish and fried chicken. Nuts and nut butters, in large amounts. Dairy Whole milk and chocolate milk. Sour cream. Cream. Ice cream. Cream cheese. Milkshakes. Fats and oils Butter. Margarine. Shortening. Ghee. Beverages Coffee and tea, with or without caffeine. Carbonated beverages. Sodas. Energy drinks. Fruit juice made with acidic fruits, such as orange or grapefruit. Tomato juice. Alcoholic drinks. Sweets and desserts Chocolate and  cocoa. Donuts. Seasonings and condiments Pepper. Peppermint and spearmint. Added salt. Any condiments, herbs, or seasonings that cause symptoms. For some people, this may include curry, hot sauce, or vinegar-based salad dressings. The items listed above may not be a complete list of what you should not eat and drink. Contact a food expert for more options. Questions to ask your doctor Diet and lifestyle changes are often the first steps that are taken to manage symptoms of GERD. If diet and lifestyle changes do not help, talk with your doctor about taking medicines. Where to find more information  International Foundation for Gastrointestinal Disorders: aboutgerd.org Summary  When you have GERD, food and lifestyle choices are very important in easing your symptoms.  Eat small meals often instead of 3 large meals a day. Eat your meals slowly and in a place where you are relaxed.  Avoid bending over or lying down until 2-3 hours after eating.  Limit high-fat foods such as fatty meats or fried foods. This information is not intended to replace advice given to you by your health care provider. Make sure you discuss any questions you have with your health care provider. Document Revised: 02/13/2020 Document Reviewed: 02/13/2020 Elsevier Patient Education  2021 [employer]  

## 2020-10-04 ENCOUNTER — Ambulatory Visit: Payer: Medicare HMO | Admitting: Psychiatry

## 2020-10-04 LAB — COMPLETE METABOLIC PANEL WITH GFR
AG Ratio: 2 (calc) (ref 1.0–2.5)
ALT: 38 U/L — ABNORMAL HIGH (ref 6–29)
AST: 54 U/L — ABNORMAL HIGH (ref 10–35)
Albumin: 4.8 g/dL (ref 3.6–5.1)
Alkaline phosphatase (APISO): 66 U/L (ref 37–153)
BUN/Creatinine Ratio: 20 (calc) (ref 6–22)
BUN: 32 mg/dL — ABNORMAL HIGH (ref 7–25)
CO2: 28 mmol/L (ref 20–32)
Calcium: 10.9 mg/dL — ABNORMAL HIGH (ref 8.6–10.4)
Chloride: 102 mmol/L (ref 98–110)
Creat: 1.6 mg/dL — ABNORMAL HIGH (ref 0.60–0.93)
GFR, Est African American: 37 mL/min/{1.73_m2} — ABNORMAL LOW (ref >=60)
GFR, Est Non African American: 32 mL/min/{1.73_m2} — ABNORMAL LOW (ref >=60)
Globulin: 2.4 g/dL (calc) (ref 1.9–3.7)
Glucose, Bld: 90 mg/dL (ref 65–99)
Potassium: 4.4 mmol/L (ref 3.5–5.3)
Sodium: 140 mmol/L (ref 135–146)
Total Bilirubin: 0.5 mg/dL (ref 0.2–1.2)
Total Protein: 7.2 g/dL (ref 6.1–8.1)

## 2020-10-04 LAB — CBC WITH DIFFERENTIAL/PLATELET
Absolute Monocytes: 649 cells/uL (ref 200–950)
Basophils Absolute: 48 cells/uL (ref 0–200)
Basophils Relative: 0.7 %
Eosinophils Absolute: 83 cells/uL (ref 15–500)
Eosinophils Relative: 1.2 %
HCT: 41 % (ref 35.0–45.0)
Hemoglobin: 13.5 g/dL (ref 11.7–15.5)
Lymphs Abs: 1808 cells/uL (ref 850–3900)
MCH: 28.8 pg (ref 27.0–33.0)
MCHC: 32.9 g/dL (ref 32.0–36.0)
MCV: 87.4 fL (ref 80.0–100.0)
MPV: 10.2 fL (ref 7.5–12.5)
Monocytes Relative: 9.4 %
Neutro Abs: 4313 cells/uL (ref 1500–7800)
Neutrophils Relative %: 62.5 %
Platelets: 347 10*3/uL (ref 140–400)
RBC: 4.69 10*6/uL (ref 3.80–5.10)
RDW: 12.6 % (ref 11.0–15.0)
Total Lymphocyte: 26.2 %
WBC: 6.9 10*3/uL (ref 3.8–10.8)

## 2020-10-04 LAB — H. PYLORI BREATH TEST: H. pylori Breath Test: NOT DETECTED

## 2020-10-19 NOTE — Progress Notes (Signed)
Assessment and Plan:  Mirabella was seen today for gi problem.  Diagnoses and all orders for this visit:  Gastroesophageal reflux disease Well managed on current medications, continue slow taper from PPI back to [place]; contact office if any recurrent sx  Deterioration in renal function -     BASIC METABOLIC PANEL WITH GFR -     Urinalysis, Routine w reflex microscopic  Stage 3a chronic kidney disease (HCC) Increase fluids, avoid NSAIDS, monitor sugars, will monitor -     BASIC METABOLIC PANEL WITH GFR -     Urinalysis, Routine w reflex microscopic  Further disposition pending results of labs. Discussed med's effects and SE's.   Over 20 minutes of exam, counseling, chart review, and critical decision making was performed.   Future Appointments  Date Time Provider [place]  10/25/2020  1:15 PM [name], NP [facility] None  11/29/2020  2:30 PM Liane Comber, NP [facility] None  04/17/2021 10:00 AM Liane Comber, NP [facility] None    ------------------------------------------------------------------------------------------------------------------   HPI BP 108/74   Pulse 82   Temp (!) 96.4 F (35.8 C)   Wt 185 lb (83.9 kg)   SpO2 96%   BMI 28.98 kg/m   70 y.o.female with hx of GERD and gastric ulcers presents for 2 week follow up on epigastric pain/reflux.   She had several months of epigastric pain and gastritis sx without clear trigger, denied NSAID, alcohol use, had negative H. Pylori last visit and was initiated on omeprazole 20 mg BID, reports sx are much improved, has reduced to omeprazole 20 mg AM and restarting famotidine PM.   She had EGD by Dr. Watt Climes 08/2017 showed small hiatal hernia, normal stomach   H. Pylori was negative last visit, normal CBC, stable LFTs (known fatty liver dz), did note renal functions down, she has been pushing fluid intake since upper GI sx are improved.   Lab Results  Component Value Date   GFRNONAA 32 (L) 10/03/2020    GFRNONAA 53 (L) 08/23/2020   GFRNONAA 62 04/11/2020   Lab Results  Component Value Date   CREATININE 1.60 (H) 10/03/2020   CREATININE 1.07 (H) 08/23/2020   CREATININE 0.93 04/11/2020     Past Medical History:  Diagnosis Date  . Adnexal mass 04/08/2020  . Anemia   . Depression   . Extrinsic asthma 11/09/2014  . Gait abnormality 12/26/2019  . GERD (gastroesophageal reflux disease)   . Hay fever   . Head trauma   . IBS (irritable bowel syndrome)   . Memory loss   . Migraines   . OSA on CPAP   . Reflux   . RLS (restless legs syndrome)   . Sinus congestion      Allergies  Allergen Reactions  . Erythromycin Other (See Comments)    TBS  . Iodinated Diagnostic Agents Other (See Comments)    Hot flashes, itching scalp Hot flashes, itching scalp  . Penicillins Hives  . Clarithromycin Other (See Comments)    Itching ankles  . Brintellix [Vortioxetine]     Itching    Current Outpatient Medications on File Prior to Visit  Medication Sig  . acetaminophen (TYLENOL) 650 MG CR tablet Take 650 mg by mouth every 8 (eight) hours as needed for pain.  . Ascorbic Acid (VITAMIN C) 100 MG tablet Take 100 mg by mouth daily.  Marland Kitchen buPROPion (WELLBUTRIN XL) 300 MG 24 hr tablet Take  1 tablet  Daily for Mood  . cholecalciferol (VITAMIN D3) 25 MCG (1000  UNIT) tablet Take 4,000 Units by mouth daily.   . Coenzyme Q10 (CO Q 10 PO) Take by mouth.  . ezetimibe (ZETIA) 10 MG tablet Take  1 tablet  Daily  for Cholesterol  . famotidine (PEPCID) 20 MG tablet Take  1 tablet  2 x/day  with Meals  to Prevent Indigestion & Heartburn  . fenofibrate (TRICOR) 145 MG tablet Take  1 tablet  Daily  for Triglycerides (Blood Fats)  . gabapentin (NEURONTIN) 300 MG capsule Take 1 capsule 1 hour  before Bedtime  as needed for Restless Legs & Sleep  . lamoTRIgine (LAMICTAL) 100 MG tablet Take  1 tablet  Daily  to Stabilize Mood  . Omega-3 1000 MG CAPS Take by mouth.  Marland Kitchen omeprazole (PRILOSEC) 20 MG capsule Take 1 capsule  (20 mg total) by mouth 2 (two) times daily before a meal. Then reduce to once daily.  . ondansetron (ZOFRAN-ODT) 8 MG disintegrating tablet DISSOLVE ONE TABLET BY MOUTH EVERY 8 HOURS AS NEEDED FOR NAUSEA AND VOMITING  . sucralfate (CARAFATE) 1 g tablet Take  1 tablet  4 x /day  before Meals & Bedtime  . SUMAtriptan (IMITREX) 25 MG tablet TAKE 1-2 TABLETS BY MOUTH AT ONSET OF MIGRAINE MAY REPEAT IN 2 HOURS IF HEADACHE PERSISTS MAX OF 4 TABLETS PER 24 HR   No current facility-administered medications on file prior to visit.    ROS: all negative except above.   Physical Exam:  BP 108/74   Pulse 82   Temp (!) 96.4 F (35.8 C)   Wt 185 lb (83.9 kg)   SpO2 96%   BMI 28.98 kg/m   General Appearance: Well nourished, in no apparent distress. Eyes: PERRLA, EOMs, conjunctiva no swelling or erythema Sinuses: No Frontal/maxillary tenderness ENT/Mouth: Ext aud canals clear, TMs without erythema, bulging. No erythema, swelling, or exudate on post pharynx.  Tonsils not swollen or erythematous. Hearing normal.  Neck: Supple, thyroid normal.  Respiratory: Respiratory effort normal, BS equal bilaterally without rales, rhonchi, wheezing or stridor.  Cardio: RRR with no MRGs. Brisk peripheral pulses without edema.  Abdomen: Soft, + BS.  Non tender, no guarding, rebound, hernias, masses. Lymphatics: Non tender without lymphadenopathy.  Musculoskeletal: Full ROM, 5/5 strength, normal gait.  Skin: Warm, dry without rashes, lesions, ecchymosis.  Neuro: Cranial nerves intact. Normal muscle tone, no cerebellar symptoms. Sensation intact.  Psych: Awake and oriented X 3, normal affect, Insight and Judgment appropriate.     Izora Ribas, NP 10:47 AM [facility]

## 2020-10-22 ENCOUNTER — Encounter: Payer: Self-pay | Admitting: Adult Health

## 2020-10-22 ENCOUNTER — Ambulatory Visit (INDEPENDENT_AMBULATORY_CARE_PROVIDER_SITE_OTHER): Payer: Medicare Other | Admitting: Adult Health

## 2020-10-22 ENCOUNTER — Other Ambulatory Visit: Payer: Self-pay

## 2020-10-22 VITALS — BP 108/74 | HR 82 | Temp 96.4°F | Wt 185.0 lb

## 2020-10-22 DIAGNOSIS — R1013 Epigastric pain: Secondary | ICD-10-CM | POA: Diagnosis not present

## 2020-10-22 DIAGNOSIS — N1831 Chronic kidney disease, stage 3a: Secondary | ICD-10-CM | POA: Diagnosis not present

## 2020-10-22 DIAGNOSIS — K219 Gastro-esophageal reflux disease without esophagitis: Secondary | ICD-10-CM | POA: Diagnosis not present

## 2020-10-22 DIAGNOSIS — N289 Disorder of kidney and ureter, unspecified: Secondary | ICD-10-CM

## 2020-10-22 MED ORDER — OMEPRAZOLE 20 MG PO CPDR: 20.0000 mg | DELAYED_RELEASE_CAPSULE | Freq: Two times a day (BID) | ORAL | 0 refills | Status: DC | PRN

## 2020-10-23 LAB — URINALYSIS, ROUTINE W REFLEX MICROSCOPIC
Bilirubin Urine: NEGATIVE
Glucose, UA: NEGATIVE
Hgb urine dipstick: NEGATIVE
Ketones, ur: NEGATIVE
Leukocytes,Ua: NEGATIVE
Nitrite: NEGATIVE
Protein, ur: NEGATIVE
Specific Gravity, Urine: 1.02 (ref 1.001–1.03)
pH: 7 (ref 5.0–8.0)

## 2020-10-23 LAB — BASIC METABOLIC PANEL WITH GFR
BUN/Creatinine Ratio: 21 (calc) (ref 6–22)
BUN: 23 mg/dL (ref 7–25)
CO2: 28 mmol/L (ref 20–32)
Calcium: 10.8 mg/dL — ABNORMAL HIGH (ref 8.6–10.4)
Chloride: 104 mmol/L (ref 98–110)
Creat: 1.1 mg/dL — ABNORMAL HIGH (ref 0.60–0.93)
GFR, Est African American: 59 mL/min/{1.73_m2} — ABNORMAL LOW (ref >=60)
GFR, Est Non African American: 51 mL/min/{1.73_m2} — ABNORMAL LOW (ref >=60)
Glucose, Bld: 105 mg/dL — ABNORMAL HIGH (ref 65–99)
Potassium: 4.9 mmol/L (ref 3.5–5.3)
Sodium: 142 mmol/L (ref 135–146)

## 2020-10-25 ENCOUNTER — Ambulatory Visit: Payer: Medicare Other | Admitting: Neurology

## 2020-10-25 ENCOUNTER — Encounter: Payer: Self-pay | Admitting: Neurology

## 2020-10-25 VITALS — BP 127/69 | HR 91 | Ht 66.0 in | Wt 186.0 lb

## 2020-10-25 DIAGNOSIS — G4733 Obstructive sleep apnea (adult) (pediatric): Secondary | ICD-10-CM | POA: Diagnosis not present

## 2020-10-25 DIAGNOSIS — R413 Other amnesia: Secondary | ICD-10-CM

## 2020-10-25 DIAGNOSIS — F32A Depression, unspecified: Secondary | ICD-10-CM | POA: Diagnosis not present

## 2020-10-25 DIAGNOSIS — Z9989 Dependence on other enabling machines and devices: Secondary | ICD-10-CM

## 2020-10-25 NOTE — Patient Instructions (Signed)
Please follow-up with psychotherapist  Join a gym, start exercising  See you back in 9-12 months

## 2020-10-25 NOTE — Progress Notes (Addendum)
PATIENT: Kimberly Ray DOB: [birth year]  REASON FOR VISIT: follow up HISTORY FROM: patient  HISTORY OF PRESENT ILLNESS: Today 10/25/20 Kimberly Ray is a 70 year old female with multiple complaints here with follow-up:  Memory: She had neuropsychological evaluation with Dr. Nicole Kindred, felt the issue was more related to executive control function, could be due to depression, aging, attention deficit disorder.  Felt neurodegeneration is less likely, can follow-up in 1 to 2 years. MRI of brain has been essentially normal for age.   MMSE 29/30 today. Is emotional today.  Depression: Recommended to see therapist from Dr. Nicole Kindred. [facility]. Has been taken off Cymbalta; now just on Lamictal and Wellbutrin. Is going through grief, family stress.  RLS: She stopped Requip, switched to gabapentin for RLS. Transition was rough, afterwards, zero restless leg symptoms. On gabapentin 300 mg qhs.  CPAP: uses faithfully every night, needs more supplies, uses machine with chip   Gait, balance: much better off requip, looking at joining gym, not falling  Hallucinations: better since of Requip  Update June 27, 2020 SS: Kimberly Ray is a 70 year old female with history of diabetes, restless leg syndrome, memory disturbance, and visual/olfactory hallucinations.  MRI of the brain was essentially normal for age.  Laboratory evaluation (RPR, B12, sed rate, CMP) was unremarkable.  Has several issues of concern:  Balance, gait: Noted difficulty getting out of a chair, balance is off, used a cane a few times felt much steadier, 1.5 years ago, had a fall fracturing her left ankle, is understandably fearful of falling again, sometimes walking her Achilles tendon may feel tight, in the summer, she was swimming, had notable improvement. No report of back pain, no numbness/tingling of lower extremities.  Memory: Lives alone, difficulty with conversation, losing train of thought, finding  her words.  Does her own ADLs, drives a car.  MMSE 28/30.  RLS: On Requip, 1 mg at bedtime, rarely takes another tablet in the afternoon if needed  CPAP: Uses every night faithfully  Hallucinations: few visual out of periphery, after staying with her mother for a few days, getting back to her house.  No further olfactory (tobacco) smells.   Depression: Follows with psychiatry, on stable doses of Wellbutrin, Cymbalta, Lamictal.  Has seasonal affective disorder.  Here today for follow-up unaccompanied.  Is concerned she has dementia or Parkinson's disease.  Has started several supplements, iron, magnesium, zinc, B12.  Overall, felt all of her symptoms improved over the summer, which is a better season for her mood disorder, also was exercising.  HISTORY 12/26/2019 Dr. Jannifer Franklin: Kimberly Ray is a 70 year old right-handed white female with a history of diabetes and restless leg syndrome.  Restless leg syndrome runs in her family, her father and her brother have/had similar issues.  Over the last 5 or 6 years, she has noted some mild changes in memory.  She has had occasional events which she cannot figure out how things work, she may have some difficulty with remembering recent events or remembering names for people.  She over the last year has had occasional events where she may smell of tobacco smoke when there is none.  She has had events where she may see a vague object in her right peripheral vision but when she looks she sees nothing.  She sometimes thinks it is a mouse or a person, but she never actually sees the animal or person, she just has a sensation that that is what it is.  The patient  is on Requip taking 5 mg at night, she has not had any recent increase in her dosing.  She has been placed on Seroquel.  The patient has also noted some change in balance, she will stumble on occasion, she fell last summer and fractured the left ankle.  She denies any numbness in her feet associated with her diabetes.   She has occasional headaches, she denies significant issues controlling the bowels or the bladder, she occasionally will have stress incontinence of the bladder.  She claims that her father also had memory problems before he died.  Occasionally when she stands up she may have a bit of a tremor involving the legs, occasionally a jaw tremor or tremor involving the left shoulder.  Currently, the patient is able to keep up with her medications and appointments but she is now using a pill dispenser.  She is able to operate a motor vehicle, she denies any issues getting lost.  She is sent to this office for further evaluation.  REVIEW OF SYSTEMS: Out of a complete 14 system review of symptoms, the patient complains only of the following symptoms, and all other reviewed systems are negative.  Walking difficulty, memory loss, restless legs, depression  ALLERGIES: Allergies  Allergen Reactions   Erythromycin Other (See Comments)    TBS   Iodinated Diagnostic Agents Other (See Comments)    Hot flashes, itching scalp Hot flashes, itching scalp   Penicillins Hives   Clarithromycin Other (See Comments)    Itching ankles   Brintellix [Vortioxetine]     Itching    HOME MEDICATIONS: Outpatient Medications Prior to Visit  Medication Sig Dispense Refill   acetaminophen (TYLENOL) 650 MG CR tablet Take 650 mg by mouth every 8 (eight) hours as needed for pain.     Ascorbic Acid (VITAMIN C) 100 MG tablet Take 100 mg by mouth daily.     buPROPion (WELLBUTRIN XL) 300 MG 24 hr tablet Take  1 tablet  Daily for Mood 90 tablet 0   cholecalciferol (VITAMIN D3) 25 MCG (1000 UNIT) tablet Take 4,000 Units by mouth daily.      Coenzyme Q10 (CO Q 10 PO) Take by mouth.     ezetimibe (ZETIA) 10 MG tablet Take  1 tablet  Daily  for Cholesterol 90 tablet 0   famotidine (PEPCID) 20 MG tablet Take  1 tablet  2 x/day  with Meals  to Prevent Indigestion & Heartburn 180 tablet 0   fenofibrate (TRICOR) 145 MG  tablet Take  1 tablet  Daily  for Triglycerides (Blood Fats) 90 tablet 0   gabapentin (NEURONTIN) 300 MG capsule Take 1 capsule 1 hour  before Bedtime  as needed for Restless Legs & Sleep 90 capsule 0   lamoTRIgine (LAMICTAL) 100 MG tablet Take  1 tablet  Daily  to Stabilize Mood 90 tablet 0   Omega-3 1000 MG CAPS Take by mouth.     omeprazole (PRILOSEC) 20 MG capsule Take 1 capsule (20 mg total) by mouth 2 (two) times daily as needed. 60 capsule 0   ondansetron (ZOFRAN-ODT) 8 MG disintegrating tablet DISSOLVE ONE TABLET BY MOUTH EVERY 8 HOURS AS NEEDED FOR NAUSEA AND VOMITING 60 tablet 0   sucralfate (CARAFATE) 1 g tablet Take  1 tablet  4 x /day  before Meals & Bedtime 360 tablet 0   SUMAtriptan (IMITREX) 25 MG tablet TAKE 1-2 TABLETS BY MOUTH AT ONSET OF MIGRAINE MAY REPEAT IN 2 HOURS IF HEADACHE PERSISTS MAX OF 4 TABLETS  PER 24 HR 30 tablet 0   No facility-administered medications prior to visit.    PAST MEDICAL HISTORY: Past Medical History:  Diagnosis Date   Adnexal mass 04/08/2020   Anemia    Depression    Extrinsic asthma 11/09/2014   Gait abnormality 12/26/2019   GERD (gastroesophageal reflux disease)    Hay fever    Head trauma    IBS (irritable bowel syndrome)    Memory loss    Migraines    OSA on CPAP    Reflux    RLS (restless legs syndrome)    Sinus congestion     PAST SURGICAL HISTORY: Past Surgical History:  Procedure Laterality Date   ABDOMINAL HYSTERECTOMY     BREAST BIOPSY Right 02/27/2014   Stereo- Benign   CHOLECYSTECTOMY  11/03/2017   Dr. Rosendo Gros   LAPAROSCOPIC HYSTERECTOMY  12/2017   NASAL SINUS SURGERY     ORIF ANKLE FRACTURE BIMALLEOLAR Left 03/14/2019   [facility], Dr. Sheran Lawless   perforated eardrum     left side   TONSILLECTOMY AND ADENOIDECTOMY     URETEROSCOPY     for ureteral stone    FAMILY HISTORY: Family History  Problem Relation Age of Onset   Diabetes Mother    Heart failure Father    Dementia Father     Mood Disorder Father    Anxiety disorder Father    Diabetes Sister    Anxiety disorder Sister    Depression Sister    Breast cancer Maternal Grandmother 59   Breast cancer Maternal Aunt 45   Breast cancer Cousin    Osteoporosis Brother    Bipolar disorder Brother    Anxiety disorder Brother    Heart failure Maternal Grandfather 23   Colon cancer Paternal Grandmother 63   Mental illness Paternal Grandmother    Stomach cancer Maternal Aunt     SOCIAL HISTORY: Social History   Socioeconomic History   Marital status: Divorced    Spouse name: Not on file   Number of children: 2   Years of education: 13   Highest education level: Not on file  Occupational History    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 Patient is flight attendant. Patient has college education one year      Right handed.   Caffeine- Rare.    Social Determinants of Health   Financial Resource Strain: Not on file  Food Insecurity: Not on file  Transportation Needs: Not on file  Physical Activity: Not on file  Stress: Not on file  Social Connections: Not on file  Intimate Partner Violence: Not on file   PHYSICAL EXAM  Vitals:   10/25/20 1311  BP: 127/69  Pulse: 91  Weight: 186 lb (84.4 kg)  Height: 5\' 6"  (1.676 m)   Body mass index is 30.02 kg/m.  Generalized: Well developed, in no acute distress  MMSE - Mini Mental State Exam 10/25/2020 06/27/2020 12/26/2019  Orientation to time 5  5 5  Orientation to Place 5 5 5   Registration 3 3 3    Attention/ Calculation 4 4 2   Recall 3 2 3   Language- name 2 objects 2 2 2   Language- repeat 1 1 1   Language- follow 3 step command 3 3 3   Language- read & follow direction 1 1 1   Write a sentence 1 1 1   Copy design 1 1 1   Total score 29 28 27     Neurological examination  Mentation: Alert oriented to time, place, history taking. Follows all commands speech and language fluent Cranial nerve II-XII: Pupils were equal round reactive to light. Extraocular movements were full, visual field were full on confrontational test. Facial sensation and strength were normal. Head turning and shoulder shrug  were normal and symmetric. Motor: The motor testing reveals 5 over 5 strength of all 4 extremities. Good symmetric motor tone is noted throughout.  No tremor. Sensory: Sensory testing is intact to soft touch on all 4 extremities. No evidence of extinction is noted.  Coordination: Cerebellar testing reveals good finger-nose-finger and heel-to-shin bilaterally.  Gait and station: Gait is normal, normal strides, tandem gait slightly unsteady. Reflexes: Deep tendon reflexes are symmetric and normal bilaterally.   DIAGNOSTIC DATA (LABS, IMAGING, TESTING) - I reviewed patient records, labs, notes, testing and imaging myself where available.  Lab Results  Component Value Date   WBC 6.9 10/03/2020   HGB 13.5 10/03/2020   HCT 41.0 10/03/2020   MCV 87.4 10/03/2020   PLT 347 10/03/2020      Component Value Date/Time   NA 142 10/22/2020 1108   NA 141 12/26/2019 1227   K 4.9 10/22/2020 1108   CL 104 10/22/2020 1108   CO2 28 10/22/2020 1108   GLUCOSE 105 (H) 10/22/2020 1108   BUN 23 10/22/2020 1108   BUN 16 12/26/2019 1227   CREATININE 1.10 (H) 10/22/2020 1108   CALCIUM 10.8 (H) 10/22/2020 1108   PROT 7.2 10/03/2020 1425   PROT 6.8 12/26/2019 1227   ALBUMIN 4.8 12/26/2019 1227   AST 54 (H) 10/03/2020 1425   ALT 38 (H) 10/03/2020 1425   ALKPHOS 90 12/26/2019 1227   BILITOT 0.5 10/03/2020  1425   BILITOT 0.2 12/26/2019 1227   GFRNONAA 51 (L) 10/22/2020 1108   GFRAA 59 (L) 10/22/2020 1108   Lab Results  Component Value Date   CHOL 191 08/23/2020   HDL 42 (L) 08/23/2020   LDLCALC 100 (H) 08/23/2020   TRIG 366 (H) 08/23/2020   CHOLHDL 4.5 08/23/2020   Lab Results  Component Value Date   HGBA1C 6.3 (H) 04/11/2020   Lab Results  Component Value Date   VITAMINB12 332 04/11/2020   Lab Results  Component Value Date   TSH 2.97 08/23/2020   ASSESSMENT AND PLAN 70 y.o. year old female  has a past medical history of Adnexal mass (04/08/2020), Anemia, Depression, Extrinsic asthma (11/09/2014), Gait abnormality (12/26/2019), GERD (gastroesophageal reflux disease), Hay fever, Head trauma, IBS (irritable bowel syndrome), Memory loss, Migraines, OSA on CPAP, Reflux, RLS (restless legs syndrome), and Sinus congestion. here with:  1.  Mild memory disturbance 2.  Restless leg syndrome 3.  Visual/olfactory hallucinations 4.  Gait disturbance  -She had neuropsychological evaluation with Dr. Nicole Kindred, felt the issue was more related to executive control function, could be due to depression, aging, attention deficit disorder.  Felt neurodegeneration is less likely, can follow-up in 1 to 2 years  -MMSE 29/30 today   -Never did PT ordered last time, encouraged  to join gym, get back into pool exercises  -RLS doing much better with gabapentin, off Requip   -Hallucinations essentially resolved off Requip   -Pursue referral for psychotherapy with Dr. Nicole Kindred, depression felt to be factor in memory issues  -Will follow-up in 9 months or sooner if needed  5. OSA on CPAP -Will order supplies, addend note with download, needs to bring in chip from machine -01/09/21 SS: She brought her card in, download reveals of last 30 days, 24/30 days use, > 4 hours 21 days 70%, average usage days used 6 hours 52 minutes.  Set pressure 7 cmH2O, EPR level 1.  Leak in the 95th percentile 22.5, AHI 0.6.   -She could improve her compliance > 4 hours nightly, missed a few days of use during last  30 days, will send order for supplies, watch mask for leaking, okay to continue if notices no issues, otherwise could have mask refit  I spent 30 minutes of face-to-face and non-face-to-face time with patient.  This included previsit chart review, lab review, study review, order entry, electronic health record documentation, patient education.  Butler Denmark, AGNP-C, DNP 10/25/2020, 1:19 PM [facility] [address], [place], [place] [address] [phone]

## 2020-10-26 ENCOUNTER — Ambulatory Visit: Payer: Medicare HMO | Admitting: Adult Health

## 2020-10-28 NOTE — Progress Notes (Signed)
I have read the note, and I agree with the clinical assessment and plan.  Margo Lama K Rashed Edler   

## 2020-11-02 ENCOUNTER — Ambulatory Visit (INDEPENDENT_AMBULATORY_CARE_PROVIDER_SITE_OTHER): Payer: Medicare Other | Admitting: Adult Health

## 2020-11-02 ENCOUNTER — Encounter: Payer: Self-pay | Admitting: Adult Health

## 2020-11-02 ENCOUNTER — Other Ambulatory Visit: Payer: Self-pay

## 2020-11-02 VITALS — BP 126/74 | HR 60 | Temp 97.5°F | Wt 189.0 lb

## 2020-11-02 DIAGNOSIS — F319 Bipolar disorder, unspecified: Secondary | ICD-10-CM

## 2020-11-02 DIAGNOSIS — G2581 Restless legs syndrome: Secondary | ICD-10-CM

## 2020-11-02 DIAGNOSIS — Z1152 Encounter for screening for COVID-19: Secondary | ICD-10-CM | POA: Diagnosis not present

## 2020-11-02 DIAGNOSIS — F39 Unspecified mood [affective] disorder: Secondary | ICD-10-CM

## 2020-11-02 DIAGNOSIS — E782 Mixed hyperlipidemia: Secondary | ICD-10-CM

## 2020-11-02 DIAGNOSIS — J302 Other seasonal allergic rhinitis: Secondary | ICD-10-CM | POA: Diagnosis not present

## 2020-11-02 DIAGNOSIS — K219 Gastro-esophageal reflux disease without esophagitis: Secondary | ICD-10-CM | POA: Diagnosis not present

## 2020-11-02 LAB — POC COVID19 BINAXNOW: SARS Coronavirus 2 Ag: NEGATIVE

## 2020-11-02 MED ORDER — PREDNISONE 20 MG PO TABS: ORAL_TABLET | ORAL | 0 refills | Status: DC

## 2020-11-02 MED ORDER — FENOFIBRATE 145 MG PO TABS: ORAL_TABLET | ORAL | 3 refills | Status: DC

## 2020-11-02 MED ORDER — FENOFIBRATE 145 MG PO TABS: ORAL_TABLET | ORAL | 0 refills | Status: DC

## 2020-11-02 MED ORDER — ONDANSETRON 8 MG PO TBDP: ORAL_TABLET | ORAL | 0 refills | Status: DC

## 2020-11-02 MED ORDER — ALBUTEROL SULFATE HFA 108 (90 BASE) MCG/ACT IN AERS: 1.0000 | INHALATION_SPRAY | RESPIRATORY_TRACT | 0 refills | Status: DC | PRN

## 2020-11-02 MED ORDER — SUCRALFATE 1 G PO TABS: ORAL_TABLET | ORAL | 0 refills | Status: DC

## 2020-11-02 MED ORDER — GABAPENTIN 300 MG PO CAPS: ORAL_CAPSULE | ORAL | 0 refills | Status: DC

## 2020-11-02 MED ORDER — FEXOFENADINE HCL 180 MG PO TABS: 180.0000 mg | ORAL_TABLET | Freq: Every day | ORAL | 2 refills | Status: DC

## 2020-11-02 MED ORDER — BUPROPION HCL ER (XL) 300 MG PO TB24: ORAL_TABLET | ORAL | 1 refills | Status: DC

## 2020-11-02 MED ORDER — FAMOTIDINE 20 MG PO TABS: ORAL_TABLET | ORAL | 3 refills | Status: DC

## 2020-11-02 MED ORDER — BUPROPION HCL ER (XL) 300 MG PO TB24: ORAL_TABLET | ORAL | 3 refills | Status: DC

## 2020-11-02 MED ORDER — BENZONATATE 200 MG PO CAPS: ORAL_CAPSULE | ORAL | 1 refills | Status: DC

## 2020-11-02 MED ORDER — EZETIMIBE 10 MG PO TABS: ORAL_TABLET | ORAL | 3 refills | Status: DC

## 2020-11-02 MED ORDER — EZETIMIBE 10 MG PO TABS: ORAL_TABLET | ORAL | 0 refills | Status: DC

## 2020-11-02 MED ORDER — LAMOTRIGINE 100 MG PO TABS: ORAL_TABLET | ORAL | 3 refills | Status: DC

## 2020-11-02 MED ORDER — GABAPENTIN 300 MG PO CAPS: ORAL_CAPSULE | ORAL | 3 refills | Status: DC

## 2020-11-02 MED ORDER — SUMATRIPTAN SUCCINATE 25 MG PO TABS: ORAL_TABLET | ORAL | 0 refills | Status: DC

## 2020-11-02 NOTE — Patient Instructions (Addendum)
    Restart claritin  Wear mask outside - wash face  Can try flonase - more for congestion  Astalin - for allergy related runny nose     Medicines you can use  Nasal congestion  Little Remedies saline spray (aerosol/mist)- can try this, it is in the kids section - pseudoephedrine (Sudafed)- behind the counter, do not use if you have high blood pressure, medicine that have -D in them.  - phenylephrine (Sudafed PE) -Dextormethorphan + chlorpheniramine (Coridcidin HBP)- okay if you have high blood pressure -Oxymetazoline (Afrin) nasal spray- LIMIT to 3 days -Saline nasal spray -Neti pot (used distilled or bottled water)  Ear pain/congestion  -pseudoephedrine (sudafed) - Nasonex/flonase nasal spray  Fever  -Acetaminophen (Tyelnol) -Ibuprofen (Advil, motrin, aleve)  Sore Throat  -Acetaminophen (Tyelnol) -Ibuprofen (Advil, motrin, aleve) -Drink a lot of water -Gargle with salt water - Rest your voice (don't talk) -Throat sprays -Cough drops  Body Aches  -Acetaminophen (Tyelnol) -Ibuprofen (Advil, motrin, aleve)  Headache  -Acetaminophen (Tyelnol) -Ibuprofen (Advil, motrin, aleve) - Exedrin, Exedrin Migraine  Allergy symptoms (cough, sneeze, runny nose, itchy eyes) -Claritin or loratadine cheapest but likely the weakest  -Zyrtec or certizine at night because it can make you sleepy -The strongest is allegra or fexafinadine  Cheapest at [facility], [employer], [employer]  Cough  -Dextromethorphan (Delsym)- medicine that has DM in it -Guafenesin (Mucinex/Robitussin) - cough drops - drink lots of water  Chest Congestion  -Guafenesin (Mucinex/Robitussin)  Red Itchy Eyes  - Naphcon-A  Upset Stomach  - Bland diet (nothing spicy, greasy, fried, and high acid foods like tomatoes, oranges, berries) -OKAY- cereal, bread, soup, crackers, rice -Eat smaller more frequent meals -reduce caffeine, no alcohol -Loperamide (Imodium-AD) if diarrhea -Prevacid for heart  burn  General health when sick  -Hydration -wash your hands frequently -keep surfaces clean -change pillow cases and sheets often -Get fresh air but do not exercise strenuously -Vitamin D, double up on it - Vitamin C -Zinc

## 2020-11-02 NOTE — Progress Notes (Signed)
Assessment and Plan:  Kimberly Ray was seen today for cough.  Diagnoses and all orders for this visit:  Seasonal allergic rhinitis, unspecified trigger Discussed the importance of avoiding unnecessary antibiotic therapy. Allergy hygiene reviewed  Suggested symptomatic OTC remedies. Nasal saline spray for congestion. Nasal steroids, allergy pill, oral steroids offered Follow up as needed. -     predniSONE (DELTASONE) 20 MG tablet; 2 tablets daily for 3 days, 1 tablet daily for 4 days. -     benzonatate (TESSALON) 200 MG capsule; Take 1 tab three times a day as needed for cough. -     albuterol (VENTOLIN HFA) 108 (90 Base) MCG/ACT inhaler; Inhale 1-2 puffs into the lungs every 4 (four) hours as needed for wheezing or shortness of breath. Please give generic or the one that insurance covers -     fexofenadine (ALLEGRA) 180 MG tablet; Take 1 tablet (180 mg total) by mouth daily. As needed during allergy season.  Encounter for screening for COVID-19 -     POC COVID-19 BinaxNow  Gastroesophageal reflux disease, unspecified whether esophagitis present -     famotidine (PEPCID) 20 MG tablet; Take  1 tablet  2 x/day  with Meals  to Prevent Indigestion & Heartburn  Further disposition pending results of labs. Discussed med's effects and SE's.   Over 20 minutes of exam, counseling, chart review, and critical decision making was performed.   Future Appointments  Date Time Provider [place]  11/29/2020  2:30 PM Liane Comber, NP [facility] None  04/17/2021 10:00 AM Liane Comber, NP [facility] None  07/29/2021 11:15 AM [name], NP [facility] None    ------------------------------------------------------------------------------------------------------------------   HPI BP 126/74   Pulse 60   Temp (!) 97.5 F (36.4 C)   Wt 189 lb (85.7 kg)   SpO2 98%   BMI 30.51 kg/m   70 y.o.female with hx of allergies and remote asthmatic presents for evaluation of new cough x 3 days.  Negative covid 19 rapid test prior to being seen today.   She reports cough began 3 days ago, mild intermittent, but noted mucus/drainage in her throat, day 2 was much worse, more coughing fits when meds run out, persistent coughing fits. Has noted watery eyes. Denies HA, congestion, sore throat. Does note some sense of tickle in her chest. Denies dyspnea, wheezing. Denies fever/chills, fatigue.   She has tried claritin, mucinex, sudafed with benefit towards the end of day 2.   Past Medical History:  Diagnosis Date  . Adnexal mass 04/08/2020  . Anemia   . Depression   . Extrinsic asthma 11/09/2014  . Gait abnormality 12/26/2019  . GERD (gastroesophageal reflux disease)   . Hay fever   . Head trauma   . IBS (irritable bowel syndrome)   . Memory loss   . Migraines   . OSA on CPAP   . Reflux   . RLS (restless legs syndrome)   . Sinus congestion      Allergies  Allergen Reactions  . Erythromycin Other (See Comments)    TBS  . Iodinated Diagnostic Agents Other (See Comments)    Hot flashes, itching scalp Hot flashes, itching scalp  . Penicillins Hives  . Clarithromycin Other (See Comments)    Itching ankles  . Brintellix [Vortioxetine]     Itching    Current Outpatient Medications on File Prior to Visit  Medication Sig  . acetaminophen (TYLENOL) 650 MG CR tablet Take 650 mg by mouth every 8 (eight) hours as needed for pain.  Marland Kitchen  Ascorbic Acid (VITAMIN C) 100 MG tablet Take 100 mg by mouth daily.  . cholecalciferol (VITAMIN D3) 25 MCG (1000 UNIT) tablet Take 4,000 Units by mouth daily.   . Coenzyme Q10 (CO Q 10 PO) Take by mouth.  . famotidine (PEPCID) 20 MG tablet Take  1 tablet  2 x/day  with Meals  to Prevent Indigestion & Heartburn  . lamoTRIgine (LAMICTAL) 100 MG tablet Take  1 tablet  Daily  to Stabilize Mood  . Omega-3 1000 MG CAPS Take by mouth.  Marland Kitchen omeprazole (PRILOSEC) 20 MG capsule Take 1 capsule (20 mg total) by mouth 2 (two) times daily as needed.   No current  facility-administered medications on file prior to visit.    ROS: all negative except above.   Physical Exam:  BP 126/74   Pulse 60   Temp (!) 97.5 F (36.4 C)   Wt 189 lb (85.7 kg)   SpO2 98%   BMI 30.51 kg/m   General Appearance: Well nourished, in no apparent distress. Eyes: PERRLA, EOMs, conjunctiva no swelling or erythema. Watery eyes.  Sinuses: No Frontal/maxillary tenderness ENT/Mouth: Ext aud canals clear, TMs without erythema, bulging. No erythema, swelling, or exudate on post pharynx.  Tonsils not swollen or erythematous. Hearing normal.  Neck: Supple, thyroid normal.  Respiratory: Respiratory effort normal, BS equal bilaterally without rales, rhonchi, wheezing or stridor. Occasional dry coughing.  Cardio: RRR with no MRGs. Brisk peripheral pulses without edema.  Abdomen: Soft, + BS.  Non tender, no guarding, rebound, hernias, masses. Lymphatics: Non tender without lymphadenopathy.  Musculoskeletal: Full ROM, 5/5 strength, normal gait.  Skin: Warm, dry without rashes, lesions, ecchymosis.  Neuro: Cranial nerves intact. Normal muscle tone, no cerebellar symptoms. Sensation intact.  Psych: Awake and oriented X 3, normal affect, Insight and Judgment appropriate.     Izora Ribas, NP 11:27 AM [facility]

## 2020-11-22 ENCOUNTER — Other Ambulatory Visit: Payer: Self-pay | Admitting: Internal Medicine

## 2020-11-22 ENCOUNTER — Other Ambulatory Visit: Payer: Self-pay | Admitting: Adult Health Nurse Practitioner

## 2020-11-22 DIAGNOSIS — K219 Gastro-esophageal reflux disease without esophagitis: Secondary | ICD-10-CM

## 2020-11-22 MED ORDER — OMEPRAZOLE 20 MG PO CPDR: DELAYED_RELEASE_CAPSULE | ORAL | 1 refills | Status: DC

## 2020-11-26 ENCOUNTER — Other Ambulatory Visit: Payer: Self-pay | Admitting: Internal Medicine

## 2020-11-26 DIAGNOSIS — K219 Gastro-esophageal reflux disease without esophagitis: Secondary | ICD-10-CM

## 2020-11-26 MED ORDER — OMEPRAZOLE 20 MG PO CPDR: DELAYED_RELEASE_CAPSULE | ORAL | 3 refills | Status: DC

## 2020-11-28 NOTE — Progress Notes (Signed)
MEDICARE ANNUAL WELLNESS VISIT AND FOLLOW UP  Assessment:    Encounter for annual medicare wellness visit Due annually   Hyperlipidemia - On zetia, continue fenofibrate;  - LDL goal <100 -cont diet and exercise - Lipid panel   Hyperglycemia (prediabetes)  Discussed general issues about diabetes pathophysiology and management., Educational material distributed., Suggested low cholesterol diet., Encouraged aerobic exercise., Discussed foot care., Reminded to get yearly retinal exam.  - HbA1C   Essential hypertension - mild, currrently at goal off of medication, DASH diet, exercise and monitor at home. Call if greater than 130/80.  - TSH   Vitamin D deficiency At goal at recent check; continue to recommend supplementation for goal of 60-100 Defer vitamin D level  Osteopenia - get dexa q2y, UTD, continue Vit D and Ca, weight bearing exercises  ADD (attention deficit disorder) Off of medication since retirement;  Psych following  CKD 3 Increase fluids, avoid NSAIDS, monitor sugars, will monitor  Fatty liver Weight loss advised, avoid alcohol/tylenol, will monitor LFTs  OSA on CPAP Endorses 100 % compliance  Gastroesophageal reflux disease, esophagitis presence not specified Increase famotidine to BID; if not improved in 1 week message and will add on PPI, continue carafate in the interim Discussed diet, avoiding triggers and other lifestyle changes  RLS (restless legs syndrome) Doing well on gabapentin; increase daily walking  Recurrent Major depression, chronic (HCC) Hx of severe recurrent depression, now managed by psych Continue medications  Lifestyle discussed: diet/exerise, sleep hygiene, stress management, hydration  Overweight Long discussion about weight loss, diet, and exercise Recommended diet heavy in fruits and veggies and low in animal meats, cheeses, and dairy products, appropriate calorie intake Discussed appropriate weight for height  Follow up  at next visit  Memory changes ? R/t severe depression, neuro/psych following She will follow up with psych for management of severe mood Hallucinations resolved off of requip  Diarrhea Continue to hold supplements; off of PPI Checking stool pathogen panel  Can try soluble fiber supplement, pepto bismol, avoid imodium until C. Diff r/o; GI if unexplained and persistent   Over 40 minutes of exam, counseling, chart review and critical decision making was performed Future Appointments  Date Time Provider [place]  04/17/2021 10:00 AM Liane Comber, NP [facility] None  07/29/2021 11:15 AM [name], NP [facility] None  11/29/2021 11:00 AM Liane Comber, NP [facility] None     Plan:   During the course of the visit the patient was educated and counseled about appropriate screening and preventive services including:    Pneumococcal vaccine   Prevnar 13  Influenza vaccine  Td vaccine  Screening electrocardiogram  Bone densitometry screening  Colorectal cancer screening  Diabetes screening  Glaucoma screening  Nutrition counseling   Advanced directives: requested   Subjective:  Kimberly Ray is a 70 y.o. female who presents for Medicare Annual Wellness Visit and 3 month follow up.   She reports 3 weeks of intermittent but persistent abrupt watery diarrhea episodes following meals, was recently on omeprazole, hx of c. Diff and concerned due to similarities. Denies blood, mucus, fever/chills, abdominal pain. Has stopped supplements without improvement. Denies recent abx or travel.  She has long hx of severe depression, recently on lamictal/wellbutrin, off of cymbalta, psych NP Butler [place] is now following;  She was reporting memory changes, visual halluciations, unremarkable MRI 01/2020, was evaluated by Dr. Nicole Kindred for neuropsychological evaluation, felt the issue was more related to executive control function,  depression, aging, attention deficit  disorder. Felt  neurodegeneration is less likely. Did recommend to stop requip for RLS which resolved hallucinations, controlled by gabapentin. She also feels memory improved off of requip. Recommended PT for unsteady gait, still waiting to schedule.   She has OSA on CPAP, endorses 100% compliance and feels this has helped RLS. Typically wears around 7-8 hours each night.  Has GERD and newly on famotidine, has only been taking once daily.   BMI is Body mass index is 30.34 kg/m., she has not been working on diet and exercise, plans to start PT.  Wt Readings from Last 3 Encounters:  11/29/20 188 lb (85.3 kg)  11/02/20 189 lb (85.7 kg)  10/25/20 186 lb (84.4 kg)   Her BP is monitored expectantly with mild elevations intermittently. Never on medications.  She does not check BP at home, today their BP is BP: 114/72 She does not workout. She denies chest pain, shortness of breath, dizziness.   She is on cholesterol medication (on zetia 10 mg, on fenofibrate 145 mg daily) and denies myalgias. Her cholesterol is not at goal. The cholesterol last visit was:   Lab Results  Component Value Date   CHOL 191 08/23/2020   HDL 42 (L) 08/23/2020   LDLCALC 100 (H) 08/23/2020   TRIG 366 (H) 08/23/2020   CHOLHDL 4.5 08/23/2020   She has had prediabetes predating 2014. She has not been working on diet and exercise for prediabetes, and denies increased appetite, nausea, paresthesia of the feet, polydipsia, polyuria, visual disturbances, vomiting and weight loss. Last A1C in the office was:  Lab Results  Component Value Date   HGBA1C 6.3 (H) 04/11/2020   She has stable CKD IIIa monitored at this office. Last GFR: Lab Results  Component Value Date   GFRNONAA 51 (L) 10/22/2020   Patient is on Vitamin D supplement and was at goal at last check:    Lab Results  Component Value Date   VD25OH 53 08/23/2020        Medication Review: Current Outpatient Medications on File Prior to Visit  Medication  Sig Dispense Refill  . acetaminophen (TYLENOL) 650 MG CR tablet Take 650 mg by mouth every 8 (eight) hours as needed for pain.    Marland Kitchen albuterol (VENTOLIN HFA) 108 (90 Base) MCG/ACT inhaler Inhale 1-2 puffs into the lungs every 4 (four) hours as needed for wheezing or shortness of breath. Please give generic or the one that insurance covers 1 each 0  . benzonatate (TESSALON) 200 MG capsule Take 1 tab three times a day as needed for cough. 90 capsule 1  . buPROPion (WELLBUTRIN XL) 300 MG 24 hr tablet Take  1 tablet  Daily for Mood 90 tablet 3  . CYANOCOBALAMIN SL Place 1 tablet under the tongue daily. Unsure of dose, OTC    . famotidine (PEPCID) 20 MG tablet Take  1 tablet  2 x/day  with Meals  to Prevent Indigestion & Heartburn 180 tablet 3  . fenofibrate (TRICOR) 145 MG tablet Take  1 tablet  Daily  for Triglycerides (Blood Fats) 90 tablet 3  . fexofenadine (ALLEGRA) 180 MG tablet Take 1 tablet (180 mg total) by mouth daily. As needed during allergy season. 30 tablet 2  . gabapentin (NEURONTIN) 300 MG capsule Take 1 capsule 1 hour  before Bedtime  as needed for Restless Legs & Sleep 90 capsule 3  . lamoTRIgine (LAMICTAL) 100 MG tablet Take  1 tablet  Daily  to Stabilize Mood 90 tablet 3  . ondansetron (ZOFRAN-ODT) 8  MG disintegrating tablet DISSOLVE 1 TABLET ON THE  TONGUE EVERY 8 HOURS AS  NEEDED FOR NAUSEA AND  VOMITING 60 tablet 0  . sucralfate (CARAFATE) 1 g tablet Take  1 tablet  4 x /day  before Meals & Bedtime 360 tablet 0  . SUMAtriptan (IMITREX) 25 MG tablet TAKE 1-2 TABLETS BY MOUTH AT ONSET OF MIGRAINE MAY REPEAT IN 2 HOURS IF HEADACHE PERSISTS MAX OF 4 TABLETS PER 24 HR 30 tablet 0  . cholecalciferol (VITAMIN D3) 25 MCG (1000 UNIT) tablet Take 4,000 Units by mouth daily.  (Patient not taking: Reported on 11/29/2020)    . Coenzyme Q10 (CO Q 10 PO) Take by mouth. (Patient not taking: Reported on 11/29/2020)    . Omega-3 1000 MG CAPS Take by mouth. (Patient not taking: Reported on 11/29/2020)      No current facility-administered medications on file prior to visit.    Allergies  Allergen Reactions  . Erythromycin Other (See Comments)    TBS  . Iodinated Diagnostic Agents Other (See Comments)    Hot flashes, itching scalp Hot flashes, itching scalp  . Penicillins Hives  . Clarithromycin Other (See Comments)    Itching ankles  . Brintellix [Vortioxetine]     Itching    Current Problems (verified) Patient Active Problem List   Diagnosis Date Noted  . B12 deficiency 04/12/2020  . Gait abnormality 12/26/2019  . RLS (restless legs syndrome) 12/26/2019  . Memory changes 08/04/2019  . CKD (chronic kidney disease) stage 3, GFR 30-59 ml/min (HCC) 04/11/2019  . Osteopenia 04/06/2018  . Fatty liver 08/29/2017  . Circadian rhythm sleep disorder 03/25/2017  . Periodic limb movement disorder (PLMD) 03/25/2017  . Overweight (BMI 25.0-29.9) 06/16/2015  . Medication management 06/15/2015  . Other abnormal glucose (prediabetes) 11/09/2014  . Vitamin D deficiency 11/09/2014  . GERD  11/09/2014  . Major depression, recurrent, chronic ([place]) 06/07/2014  . OSA on CPAP   . Mixed hyperlipidemia 06/29/2013  . ADD (attention deficit disorder) 06/29/2013    Screening Tests Immunization History  Administered Date(s) Administered  . Influenza, High Dose Seasonal PF 06/12/2016, 05/13/2017, 06/25/2020  . Influenza-Unspecified 06/02/2015, 05/19/2018, 05/19/2019  . PFIZER(Purple Top)SARS-COV-2 Vaccination 09/12/2019, 10/03/2019, 04/30/2020  . PPD Test 08/19/2011, 08/30/2013, 11/09/2014  . Pneumococcal Conjugate-13 08/18/2009, 02/18/2019  . Pneumococcal Polysaccharide-23 06/12/2016  . Td 08/19/2003  . Tdap 08/30/2013  . Zoster 08/18/2010   Tetanus: 2015 Pneumovax: 2017 Prevnar: 2011, 2020 Flu vaccine: 2021 Zostavax: 2012 - check with insurance about shingrix Covid 19: 3/3, 2021, pfizer  PAP: 2019, s/p TAH, done  MGM: 12/2019 R breast likely cyst - 6 month follow up US in 06/2020,  surgeon following, [name] q61m, did not recommend surgery  DEXA: 08/2020 - L fem T -2.3    Colonoscopy: 09/2011 Dr Hilarie Fredrickson EGD: 2019   Names of Other Physician/Practitioners you currently use: 1. [facility] here for primary care 2. Dr. Idolina Primer, eye doctor, last visit 2020, overdue 3. Dr. Tama Gander, dentist, last visit 2020, overdue   Patient Care Team: Unk Pinto, MD as PCP - General (Internal Medicine) Clarene Essex, MD as Consulting Physician (Gastroenterology) Royston Sinner Colin Benton, MD as Consulting Physician (Obstetrics and Gynecology)  SURGICAL HISTORY She  has a past surgical history that includes perforated eardrum; Nasal sinus surgery; Tonsillectomy and adenoidectomy; Breast biopsy (Right, 02/27/2014); Cholecystectomy (11/03/2017); Laparoscopic hysterectomy (12/2017); Ureteroscopy; ORIF ankle fracture bimalleolar (Left, 03/14/2019); and Abdominal hysterectomy. FAMILY HISTORY Her family history includes Anxiety disorder in her brother, father,  and sister; Bipolar disorder in her brother; Breast cancer in her cousin; Breast cancer (age of onset: 73) in her maternal aunt; Breast cancer (age of onset: 28) in her maternal grandmother; Colon cancer (age of onset: 16) in her paternal grandmother; Dementia in her father; Depression in her sister; Diabetes in her mother and sister; Heart failure in her father; Heart failure (age of onset: 26) in her maternal grandfather; Mental illness in her paternal grandmother; Mood Disorder in her father; Osteoporosis in her brother; Stomach cancer in her maternal aunt. SOCIAL HISTORY She  reports that she has never smoked. She has never used smokeless tobacco. She reports current alcohol use of about 1.0 standard drink of alcohol per week. She reports that she does not use drugs.   MEDICARE WELLNESS OBJECTIVES: Physical activity:   Cardiac risk factors:   Depression/mood screen:   Depression screen [facility] 2/9 08/19/2020   Decreased Interest 0  Down, Depressed, Hopeless 0  PHQ - 2 Score 0  Altered sleeping -  Tired, decreased energy -  Change in appetite -  Feeling bad or failure about yourself  -  Trouble concentrating -  Moving slowly or fidgety/restless -  Suicidal thoughts -  PHQ-9 Score -  Difficult doing work/chores -  Some recent data might be hidden    ADLs:  In your present state of health, do you have any difficulty performing the following activities: 08/19/2020  Hearing? N  Vision? N  Difficulty concentrating or making decisions? N  Walking or climbing stairs? N  Dressing or bathing? N  Doing errands, shopping? N  Some recent data might be hidden     Cognitive Testing  Alert? Yes  Normal Appearance?Yes  Oriented to person? Yes  Place? Yes   Time? Yes  Recall of three objects?  Yes  Can perform simple calculations? Yes  Displays appropriate judgment?Yes  Can read the correct time from a watch face?Yes  MMSE - Mini Mental State Exam 10/25/2020 06/27/2020 12/26/2019  Orientation to time 5 5 5   Orientation to Place 5 5 5   Registration 3 3 3   Attention/ Calculation 4 4 2   Recall 3 2 3   Language- name 2 objects 2 2 2   Language- repeat 1 1 1   Language- follow 3 step command 3 3 3   Language- read & follow direction 1 1 1   Write a sentence 1 1 1   Copy design 1 1 1   Total score 29 28 27      EOL planning: Does Patient Have a Medical Advance Directive?: Yes Type of Advance Directive: [place] will Does patient want to make changes to medical advance directive?: No - Patient declined Copy of [place] in Chart?: No - copy requested    Review of Systems  Constitutional: Negative for malaise/fatigue and weight loss.  HENT: Negative for hearing loss and tinnitus.   Eyes: Negative for blurred vision and double vision.  Respiratory: Negative for cough, sputum production, shortness of breath and wheezing.   Cardiovascular: Negative for  chest pain, palpitations, orthopnea, claudication, leg swelling and PND.  Gastrointestinal: Positive for diarrhea (watery diarrhea following meals for 3 weeks ). Negative for abdominal pain, blood in stool, constipation, heartburn, melena, nausea and vomiting.  Genitourinary: Negative.   Musculoskeletal: Negative for falls, joint pain and myalgias.  Skin: Negative for rash.  Neurological: Negative for dizziness, tingling, sensory change, weakness and headaches.  Endo/Heme/Allergies: Negative for polydipsia.  Psychiatric/Behavioral: Positive for depression and memory loss. Negative for hallucinations (  improved off of requip), substance abuse and suicidal ideas. The patient is not nervous/anxious and does not have insomnia.   All other systems reviewed and are negative.    Objective:     Today's Vitals   11/29/20 1431  BP: 114/72  Pulse: 83  Temp: (!) 97.5 F (36.4 C)  SpO2: 97%  Weight: 188 lb (85.3 kg)   Body mass index is 30.34 kg/m.  General appearance: alert, no distress, WD/WN, female HEENT: normocephalic, sclerae anicteric, TMs pearly, nares patent, no discharge or erythema, pharynx normal Oral cavity: MMM, no lesions Neck: supple, no lymphadenopathy, no thyromegaly, no masses Heart: RRR, normal S1, S2, no murmurs Lungs: CTA bilaterally, no wheezes, rhonchi, or rales Abdomen: +bs, soft, non tender, non distended, no masses, no hepatomegaly, no splenomegaly Musculoskeletal: nontender, no swelling, no obvious deformity, gait is slow and antalgic Extremities: no edema, no cyanosis, no clubbing Pulses: 2+ symmetric, upper and lower extremities, normal cap refill Neurological: alert, oriented x 3, CN2-12 intact, strength normal upper extremities and lower extremities, sensation normal throughout, DTRs 2+ throughout, no cerebellar signs, gait slow and antalgic  Psychiatric: Depressed affect, behavior normal, pleasant  Medicare Attestation I have personally reviewed: The  patient's medical and social history Their use of alcohol, tobacco or illicit drugs Their current medications and supplements The patient's functional ability including ADLs,fall risks, home safety risks, cognitive, and hearing and visual impairment Diet and physical activities Evidence for depression or mood disorders  The patient's weight, height, BMI, and visual acuity have been recorded in the chart.  I have made referrals, counseling, and provided education to the patient based on review of the above and I have provided the patient with a written personalized care plan for preventive services.     Izora Ribas, NP   11/29/2020

## 2020-11-29 ENCOUNTER — Encounter: Payer: Self-pay | Admitting: Adult Health

## 2020-11-29 ENCOUNTER — Ambulatory Visit (INDEPENDENT_AMBULATORY_CARE_PROVIDER_SITE_OTHER): Payer: Medicare Other | Admitting: Adult Health

## 2020-11-29 ENCOUNTER — Other Ambulatory Visit: Payer: Self-pay

## 2020-11-29 VITALS — BP 114/72 | HR 83 | Temp 97.5°F | Wt 188.0 lb

## 2020-11-29 DIAGNOSIS — R6889 Other general symptoms and signs: Secondary | ICD-10-CM

## 2020-11-29 DIAGNOSIS — R269 Unspecified abnormalities of gait and mobility: Secondary | ICD-10-CM

## 2020-11-29 DIAGNOSIS — Z79899 Other long term (current) drug therapy: Secondary | ICD-10-CM

## 2020-11-29 DIAGNOSIS — Z0001 Encounter for general adult medical examination with abnormal findings: Secondary | ICD-10-CM

## 2020-11-29 DIAGNOSIS — E782 Mixed hyperlipidemia: Secondary | ICD-10-CM

## 2020-11-29 DIAGNOSIS — Z0000 Encounter for general adult medical examination without abnormal findings: Secondary | ICD-10-CM

## 2020-11-29 DIAGNOSIS — E559 Vitamin D deficiency, unspecified: Secondary | ICD-10-CM

## 2020-11-29 DIAGNOSIS — G4733 Obstructive sleep apnea (adult) (pediatric): Secondary | ICD-10-CM

## 2020-11-29 DIAGNOSIS — R7309 Other abnormal glucose: Secondary | ICD-10-CM

## 2020-11-29 DIAGNOSIS — R413 Other amnesia: Secondary | ICD-10-CM

## 2020-11-29 DIAGNOSIS — R197 Diarrhea, unspecified: Secondary | ICD-10-CM

## 2020-11-29 DIAGNOSIS — E538 Deficiency of other specified B group vitamins: Secondary | ICD-10-CM

## 2020-11-29 DIAGNOSIS — K219 Gastro-esophageal reflux disease without esophagitis: Secondary | ICD-10-CM

## 2020-11-29 DIAGNOSIS — F988 Other specified behavioral and emotional disorders with onset usually occurring in childhood and adolescence: Secondary | ICD-10-CM

## 2020-11-29 DIAGNOSIS — F339 Major depressive disorder, recurrent, unspecified: Secondary | ICD-10-CM

## 2020-11-29 DIAGNOSIS — K760 Fatty (change of) liver, not elsewhere classified: Secondary | ICD-10-CM | POA: Diagnosis not present

## 2020-11-29 DIAGNOSIS — N1831 Chronic kidney disease, stage 3a: Secondary | ICD-10-CM | POA: Diagnosis not present

## 2020-11-29 DIAGNOSIS — G4720 Circadian rhythm sleep disorder, unspecified type: Secondary | ICD-10-CM | POA: Diagnosis not present

## 2020-11-29 DIAGNOSIS — M8580 Other specified disorders of bone density and structure, unspecified site: Secondary | ICD-10-CM

## 2020-11-29 DIAGNOSIS — E663 Overweight: Secondary | ICD-10-CM

## 2020-11-29 DIAGNOSIS — D649 Anemia, unspecified: Secondary | ICD-10-CM

## 2020-11-29 DIAGNOSIS — G4761 Periodic limb movement disorder: Secondary | ICD-10-CM

## 2020-11-29 DIAGNOSIS — Z9989 Dependence on other enabling machines and devices: Secondary | ICD-10-CM

## 2020-11-29 MED ORDER — EZETIMIBE 10 MG PO TABS: ORAL_TABLET | ORAL | 3 refills | Status: DC

## 2020-11-29 NOTE — Patient Instructions (Addendum)
Ms. Ertle , Thank you for taking time to come for your Medicare Wellness Visit. I appreciate your ongoing commitment to your health goals. Please review the following plan we discussed and let me know if I can assist you in the future.   These are the goals we discussed: Goals    . HEMOGLOBIN A1C < 5.7    . LDL CALC < 100    . Weight (lb) < 160 lb (72.6 kg)       This is a list of the screening recommended for you and due dates:  Health Maintenance  Topic Date Due  . Flu Shot  03/18/2021  . Colon Cancer Screening  10/12/2021  . Mammogram  01/01/2022  . Tetanus Vaccine  08/31/2023  . DEXA scan (bone density measurement)  Completed  . COVID-19 Vaccine  Completed  .  Hepatitis C: One time screening is recommended by Center for Disease Control  (CDC) for  adults born from [birth year] through 1966.   Completed  . Pneumonia vaccines  Completed  . HPV Vaccine  Aged Out      Check with insurance about shingrix vaccine - can get at [facility]    Contact Sarah's office about contact number for PT to schedule -   Please schedule eye exam and dental exam   Do stool sample, then can try adding soluble fiber suppleement -   Restart vitamin D, but DO NOT START magnesium until diarrhea is resolved   Chronic Diarrhea Chronic diarrhea is a condition in which a person passes frequent loose and watery stools for 4 weeks or longer. Non-chronic diarrhea usually lasts for only 2-3 days. Diarrhea can cause a person to feel weak and dehydrated. Dehydration can make the person tired and thirsty. It can also cause a dry mouth, decreased urination, and dark yellow urine. Diarrhea is a sign of an underlying problem, such as:  Infection.  Side effects of medicines.  Problems digesting something in your diet, such as milk products if you have lactose intolerance.  Conditions such as celiac disease, irritable bowel syndrome (IBS), or inflammatory bowel disease (IBD). If you have chronic diarrhea,  make sure you treat it as told by your health care provider. Follow these instructions at home: Medicines  Take over-the-counter and prescription medicines only as told by your health care provider.  If you were prescribed an antibiotic medicine, take it as told by your health care provider. Do not stop taking the antibiotic even if you start to feel better. Eating and drinking  Follow instructions from your health care provider about what to eat and drink. You may have to: ? Avoid foods that trigger diarrhea for you. ? Take an oral rehydration solution (ORS). This is a drink that keeps you hydrated. It can be found at pharmacies and retail stores. ? Drink clear fluids, such as water, diluted fruit juice, and low-calorie sports drinks. You can also get fluids by sucking on ice chips. ? Drink enough fluid to keep your urine pale yellow. This will help you avoid dehydration. ? Eat small amounts of bland foods that are easy to digest as you are able. These foods include bananas, applesauce, rice, lean meats, toast, and crackers. ? Avoid spicy or fatty foods. ? Avoid foods and beverages that contain a lot of sugar or caffeine.  Do not drink alcohol if: ? Your health care provider tells you not to drink. ? You are pregnant, may be pregnant, or are planning to become pregnant.  If  you drink alcohol: ? Limit how much you use to:  0-1 drink a day for women.  0-2 drinks a day for men. ? Be aware of how much alcohol is in your drink. In the [place] one drink equals one 12 oz bottle of beer (355 mL), one 5 oz glass of wine (148 mL), or one 1 oz glass of hard liquor (44 mL).   General instructions  Wash your hands often and after each diarrhea episode. Use soap and water. If soap and water are not available, use hand sanitizer.  Make sure that all people in your household wash their hands well and often.  Rest as told by your health care provider.  Watch your condition for any  changes.  Take a warm bath to relieve any burning or pain from frequent diarrhea episodes.  Keep all follow-up visits as told by your health care provider. This is important.   Contact a health care provider if:  You have a fever.  Your diarrhea gets worse or does not get better.  You have new symptoms.  You cannot drink fluid without vomiting.  You feel light-headed or dizzy.  You have a headache.  You have muscle cramps.  You have severe pain in the rectum. Get help right away if:  You have vomiting that does not go away.  You have chest pain.  You feel very weak or you faint.  You have bloody or black stools, or stools that look like tar.  You have severe pain, cramping, or bloating in your abdomen, or pain that stays in one place.  You have trouble breathing or you are breathing very quickly.  Your heart is beating very quickly.  Your skin feels cold and clammy.  You feel confused.  You have a severe headache.  You have signs or symptoms of dehydration, such as: ? Dark urine, very little urine, or no urine. ? Cracked lips. ? Dry mouth. ? Sunken eyes. ? Sleepiness. ? Weakness. These symptoms may represent a serious problem that is an emergency. Do not wait to see if the symptoms will go away. Get medical help right away. Call your local emergency services (911 in the [place] Do not drive yourself to the hospital. Summary  Chronic diarrhea is a condition in which a person passes frequent loose and watery stools for 4 weeks or longer.  Diarrhea is a sign of an underlying problem.  Make sure you treat your diarrhea as told by your health care provider.  Drink enough fluid to keep your urine pale yellow. This will help you avoid dehydration.  Wash your hands often and after each diarrhea episode. If soap and water are not available, use hand sanitizer. This information is not intended to replace advice given to you by your health care provider. Make sure  you discuss any questions you have with your health care provider. Document Revised: 01/31/2019 Document Reviewed: 01/31/2019 Elsevier Patient Education  [place].

## 2020-11-30 ENCOUNTER — Encounter: Payer: Self-pay | Admitting: Adult Health

## 2020-11-30 LAB — HEMOGLOBIN A1C
Hgb A1c MFr Bld: 6.1 % of total Hgb — ABNORMAL HIGH (ref <5.7)
Mean Plasma Glucose: 128 mg/dL
eAG (mmol/L): 7.1 mmol/L

## 2020-11-30 LAB — TSH: TSH: 2.77 mIU/L (ref 0.40–4.50)

## 2020-11-30 LAB — COMPLETE METABOLIC PANEL WITH GFR
AG Ratio: 2 (calc) (ref 1.0–2.5)
ALT: 34 U/L — ABNORMAL HIGH (ref 6–29)
AST: 48 U/L — ABNORMAL HIGH (ref 10–35)
Albumin: 4.7 g/dL (ref 3.6–5.1)
Alkaline phosphatase (APISO): 68 U/L (ref 37–153)
BUN/Creatinine Ratio: 19 (calc) (ref 6–22)
BUN: 21 mg/dL (ref 7–25)
CO2: 25 mmol/L (ref 20–32)
Calcium: 10.8 mg/dL — ABNORMAL HIGH (ref 8.6–10.4)
Chloride: 106 mmol/L (ref 98–110)
Creat: 1.11 mg/dL — ABNORMAL HIGH (ref 0.60–0.93)
GFR, Est African American: 58 mL/min/{1.73_m2} — ABNORMAL LOW (ref >=60)
GFR, Est Non African American: 50 mL/min/{1.73_m2} — ABNORMAL LOW (ref >=60)
Globulin: 2.4 g/dL (calc) (ref 1.9–3.7)
Glucose, Bld: 90 mg/dL (ref 65–99)
Potassium: 4.6 mmol/L (ref 3.5–5.3)
Sodium: 142 mmol/L (ref 135–146)
Total Bilirubin: 0.4 mg/dL (ref 0.2–1.2)
Total Protein: 7.1 g/dL (ref 6.1–8.1)

## 2020-11-30 LAB — CBC WITH DIFFERENTIAL/PLATELET
Absolute Monocytes: 632 cells/uL (ref 200–950)
Basophils Absolute: 37 cells/uL (ref 0–200)
Basophils Relative: 0.6 %
Eosinophils Absolute: 93 cells/uL (ref 15–500)
Eosinophils Relative: 1.5 %
HCT: 40.1 % (ref 35.0–45.0)
Hemoglobin: 12.7 g/dL (ref 11.7–15.5)
Lymphs Abs: 1922 cells/uL (ref 850–3900)
MCH: 27.5 pg (ref 27.0–33.0)
MCHC: 31.7 g/dL — ABNORMAL LOW (ref 32.0–36.0)
MCV: 86.8 fL (ref 80.0–100.0)
MPV: 10.5 fL (ref 7.5–12.5)
Monocytes Relative: 10.2 %
Neutro Abs: 3515 cells/uL (ref 1500–7800)
Neutrophils Relative %: 56.7 %
Platelets: 348 10*3/uL (ref 140–400)
RBC: 4.62 10*6/uL (ref 3.80–5.10)
RDW: 12.1 % (ref 11.0–15.0)
Total Lymphocyte: 31 %
WBC: 6.2 10*3/uL (ref 3.8–10.8)

## 2020-11-30 LAB — VITAMIN B12: Vitamin B-12: 1700 pg/mL — ABNORMAL HIGH (ref 200–1100)

## 2020-11-30 LAB — LIPID PANEL
Cholesterol: 174 mg/dL (ref <200)
HDL: 39 mg/dL — ABNORMAL LOW (ref >=50)
LDL Cholesterol (Calc): 90 mg/dL (calc)
Non-HDL Cholesterol (Calc): 135 mg/dL (calc) — ABNORMAL HIGH (ref <130)
Total CHOL/HDL Ratio: 4.5 (calc) (ref <5.0)
Triglycerides: 335 mg/dL — ABNORMAL HIGH (ref <150)

## 2020-11-30 LAB — IRON,TIBC AND FERRITIN PANEL
%SAT: 13 % (calc) — ABNORMAL LOW (ref 16–45)
Ferritin: 46 ng/mL (ref 16–288)
Iron: 68 ug/dL (ref 45–160)
TIBC: 524 mcg/dL (calc) — ABNORMAL HIGH (ref 250–450)

## 2020-11-30 LAB — MAGNESIUM: Magnesium: 2 mg/dL (ref 1.5–2.5)

## 2020-12-13 ENCOUNTER — Other Ambulatory Visit: Payer: Self-pay | Admitting: Physician Assistant

## 2020-12-13 ENCOUNTER — Other Ambulatory Visit: Payer: Self-pay | Admitting: General Surgery

## 2020-12-13 DIAGNOSIS — N63 Unspecified lump in unspecified breast: Secondary | ICD-10-CM

## 2021-01-07 DIAGNOSIS — M25552 Pain in left hip: Secondary | ICD-10-CM | POA: Diagnosis not present

## 2021-01-07 DIAGNOSIS — M545 Low back pain, unspecified: Secondary | ICD-10-CM | POA: Diagnosis not present

## 2021-01-07 DIAGNOSIS — M25551 Pain in right hip: Secondary | ICD-10-CM | POA: Diagnosis not present

## 2021-01-09 ENCOUNTER — Ambulatory Visit
Admission: RE | Admit: 2021-01-09 | Discharge: 2021-01-09 | Disposition: A | Discharge status: Home / Self Care | Payer: Medicare Other | Source: Ambulatory Visit | Attending: General Surgery | Admitting: General Surgery

## 2021-01-09 ENCOUNTER — Other Ambulatory Visit: Payer: Self-pay

## 2021-01-09 DIAGNOSIS — N63 Unspecified lump in unspecified breast: Secondary | ICD-10-CM

## 2021-01-09 DIAGNOSIS — N6312 Unspecified lump in the right breast, upper inner quadrant: Secondary | ICD-10-CM | POA: Diagnosis not present

## 2021-01-09 DIAGNOSIS — R922 Inconclusive mammogram: Secondary | ICD-10-CM | POA: Diagnosis not present

## 2021-01-09 NOTE — Addendum Note (Signed)
Addended by: [name] on: 01/09/2021 02:11 PM   Modules accepted: Orders

## 2021-01-15 DIAGNOSIS — M9903 Segmental and somatic dysfunction of lumbar region: Secondary | ICD-10-CM | POA: Diagnosis not present

## 2021-01-15 DIAGNOSIS — M9904 Segmental and somatic dysfunction of sacral region: Secondary | ICD-10-CM | POA: Diagnosis not present

## 2021-01-15 DIAGNOSIS — M9905 Segmental and somatic dysfunction of pelvic region: Secondary | ICD-10-CM | POA: Diagnosis not present

## 2021-01-15 DIAGNOSIS — M9901 Segmental and somatic dysfunction of cervical region: Secondary | ICD-10-CM | POA: Diagnosis not present

## 2021-01-15 NOTE — Progress Notes (Signed)
Cm sent to [facility]

## 2021-01-18 DIAGNOSIS — M9905 Segmental and somatic dysfunction of pelvic region: Secondary | ICD-10-CM | POA: Diagnosis not present

## 2021-01-18 DIAGNOSIS — M9901 Segmental and somatic dysfunction of cervical region: Secondary | ICD-10-CM | POA: Diagnosis not present

## 2021-01-18 DIAGNOSIS — M9904 Segmental and somatic dysfunction of sacral region: Secondary | ICD-10-CM | POA: Diagnosis not present

## 2021-01-18 DIAGNOSIS — M9903 Segmental and somatic dysfunction of lumbar region: Secondary | ICD-10-CM | POA: Diagnosis not present

## 2021-01-21 DIAGNOSIS — M9904 Segmental and somatic dysfunction of sacral region: Secondary | ICD-10-CM | POA: Diagnosis not present

## 2021-01-21 DIAGNOSIS — M9905 Segmental and somatic dysfunction of pelvic region: Secondary | ICD-10-CM | POA: Diagnosis not present

## 2021-01-21 DIAGNOSIS — M9901 Segmental and somatic dysfunction of cervical region: Secondary | ICD-10-CM | POA: Diagnosis not present

## 2021-01-21 DIAGNOSIS — M9903 Segmental and somatic dysfunction of lumbar region: Secondary | ICD-10-CM | POA: Diagnosis not present

## 2021-01-23 DIAGNOSIS — M9904 Segmental and somatic dysfunction of sacral region: Secondary | ICD-10-CM | POA: Diagnosis not present

## 2021-01-23 DIAGNOSIS — M9901 Segmental and somatic dysfunction of cervical region: Secondary | ICD-10-CM | POA: Diagnosis not present

## 2021-01-23 DIAGNOSIS — M9905 Segmental and somatic dysfunction of pelvic region: Secondary | ICD-10-CM | POA: Diagnosis not present

## 2021-01-23 DIAGNOSIS — M9903 Segmental and somatic dysfunction of lumbar region: Secondary | ICD-10-CM | POA: Diagnosis not present

## 2021-01-30 DIAGNOSIS — M9904 Segmental and somatic dysfunction of sacral region: Secondary | ICD-10-CM | POA: Diagnosis not present

## 2021-01-30 DIAGNOSIS — M9901 Segmental and somatic dysfunction of cervical region: Secondary | ICD-10-CM | POA: Diagnosis not present

## 2021-01-30 DIAGNOSIS — G4733 Obstructive sleep apnea (adult) (pediatric): Secondary | ICD-10-CM | POA: Diagnosis not present

## 2021-01-30 DIAGNOSIS — M9905 Segmental and somatic dysfunction of pelvic region: Secondary | ICD-10-CM | POA: Diagnosis not present

## 2021-01-30 DIAGNOSIS — M9903 Segmental and somatic dysfunction of lumbar region: Secondary | ICD-10-CM | POA: Diagnosis not present

## 2021-02-01 DIAGNOSIS — M9904 Segmental and somatic dysfunction of sacral region: Secondary | ICD-10-CM | POA: Diagnosis not present

## 2021-02-01 DIAGNOSIS — M9903 Segmental and somatic dysfunction of lumbar region: Secondary | ICD-10-CM | POA: Diagnosis not present

## 2021-02-01 DIAGNOSIS — M9905 Segmental and somatic dysfunction of pelvic region: Secondary | ICD-10-CM | POA: Diagnosis not present

## 2021-02-01 DIAGNOSIS — M9901 Segmental and somatic dysfunction of cervical region: Secondary | ICD-10-CM | POA: Diagnosis not present

## 2021-02-06 DIAGNOSIS — M9901 Segmental and somatic dysfunction of cervical region: Secondary | ICD-10-CM | POA: Diagnosis not present

## 2021-02-06 DIAGNOSIS — M9905 Segmental and somatic dysfunction of pelvic region: Secondary | ICD-10-CM | POA: Diagnosis not present

## 2021-02-06 DIAGNOSIS — M9904 Segmental and somatic dysfunction of sacral region: Secondary | ICD-10-CM | POA: Diagnosis not present

## 2021-02-06 DIAGNOSIS — M9903 Segmental and somatic dysfunction of lumbar region: Secondary | ICD-10-CM | POA: Diagnosis not present

## 2021-02-08 DIAGNOSIS — M9904 Segmental and somatic dysfunction of sacral region: Secondary | ICD-10-CM | POA: Diagnosis not present

## 2021-02-08 DIAGNOSIS — M9905 Segmental and somatic dysfunction of pelvic region: Secondary | ICD-10-CM | POA: Diagnosis not present

## 2021-02-08 DIAGNOSIS — M9903 Segmental and somatic dysfunction of lumbar region: Secondary | ICD-10-CM | POA: Diagnosis not present

## 2021-02-08 DIAGNOSIS — M9901 Segmental and somatic dysfunction of cervical region: Secondary | ICD-10-CM | POA: Diagnosis not present

## 2021-02-08 DIAGNOSIS — M170 Bilateral primary osteoarthritis of knee: Secondary | ICD-10-CM | POA: Diagnosis not present

## 2021-02-11 DIAGNOSIS — M9901 Segmental and somatic dysfunction of cervical region: Secondary | ICD-10-CM | POA: Diagnosis not present

## 2021-02-11 DIAGNOSIS — M9903 Segmental and somatic dysfunction of lumbar region: Secondary | ICD-10-CM | POA: Diagnosis not present

## 2021-02-11 DIAGNOSIS — M9904 Segmental and somatic dysfunction of sacral region: Secondary | ICD-10-CM | POA: Diagnosis not present

## 2021-02-11 DIAGNOSIS — M9905 Segmental and somatic dysfunction of pelvic region: Secondary | ICD-10-CM | POA: Diagnosis not present

## 2021-02-12 ENCOUNTER — Ambulatory Visit (INDEPENDENT_AMBULATORY_CARE_PROVIDER_SITE_OTHER): Payer: Medicare Other | Admitting: Adult Health

## 2021-02-12 ENCOUNTER — Encounter: Payer: Self-pay | Admitting: Adult Health

## 2021-02-12 ENCOUNTER — Other Ambulatory Visit: Payer: Self-pay

## 2021-02-12 VITALS — BP 124/82 | HR 74 | Temp 96.9°F | Wt 181.2 lb

## 2021-02-12 DIAGNOSIS — Z1152 Encounter for screening for COVID-19: Secondary | ICD-10-CM

## 2021-02-12 DIAGNOSIS — J302 Other seasonal allergic rhinitis: Secondary | ICD-10-CM | POA: Diagnosis not present

## 2021-02-12 DIAGNOSIS — H65112 Acute and subacute allergic otitis media (mucoid) (sanguinous) (serous), left ear: Secondary | ICD-10-CM

## 2021-02-12 LAB — POC COVID19 BINAXNOW: SARS Coronavirus 2 Ag: NEGATIVE

## 2021-02-12 MED ORDER — FLUTICASONE PROPIONATE 50 MCG/ACT NA SUSP: 1.0000 | Freq: Every day | NASAL | 1 refills | Status: DC

## 2021-02-12 MED ORDER — DOXYCYCLINE HYCLATE 100 MG PO CAPS: ORAL_CAPSULE | ORAL | 0 refills | Status: DC

## 2021-02-12 NOTE — Progress Notes (Signed)
Assessment and Plan:  Lajeana was seen today for otalgia.  Diagnoses and all orders for this visit:  Acute mucoid otitis media of left ear Pcn allergy, hives, unsure of cephalosporin has tolerated doxycycline in the past Can add tylenol/ibuprofen for pain Continue antihistamine, add flonase if drainage from ear, fever, chills, HA, nausea, call the office or go to the ER -     doxycycline (VIBRAMYCIN) 100 MG capsule; Take 1 capsule 2 x/day with food for 10 days.  Seasonal allergies Continue antihistamine, avoid triggers Saline nasal irrigations, add flonase  -     fluticasone (FLONASE) 50 MCG/ACT nasal spray; Place 1-2 sprays into both nostrils at bedtime.   Further disposition pending results of labs. Discussed med's effects and SE's.   Over 15 minutes of exam, counseling, chart review, and critical decision making was performed.   Future Appointments  Date Time Provider [place]  04/17/2021 10:00 AM Liane Comber, NP [facility] None  07/29/2021 11:15 AM [name], NP [facility] None  11/29/2021 11:00 AM Liane Comber, NP [facility] None    ------------------------------------------------------------------------------------------------------------------   HPI BP 124/82   Pulse 74   Temp (!) 96.9 F (36.1 C)   Wt 181 lb 3.2 oz (82.2 kg)   SpO2 97%   BMI 29.25 kg/m  70 y.o.female with hx of OSA on CPAP presents for evaluation of URI sx and ear pain. She had rapid covid 19 test negative prior to evaluation today.   She reports mild bilateral ear discomfort for 1 week, worse L>R this morning. She reports constant, describes as pressure and fullness, dull ache, 5/10, non-radiating. She notes worse allergies this summer, with runny nose/congested, sinus pressure, teary eyes. Denies HA, fever/chills. Denies ear dis  She reports recently switched from fexofenadine to zyrtec with benefit a few weeks ago, but forgot last night. Does intermittently   Past Medical  History:  Diagnosis Date   Adnexal mass 04/08/2020   Anemia    Depression    Extrinsic asthma 11/09/2014   Gait abnormality 12/26/2019   GERD (gastroesophageal reflux disease)    Hay fever    Head trauma    IBS (irritable bowel syndrome)    Memory loss    Migraines    OSA on CPAP    Reflux    RLS (restless legs syndrome)    Sinus congestion      Allergies  Allergen Reactions   Erythromycin Other (See Comments)    TBS   Iodinated Diagnostic Agents Other (See Comments)    Hot flashes, itching scalp Hot flashes, itching scalp   Penicillins Hives   Clarithromycin Other (See Comments)    Itching ankles   Brintellix [Vortioxetine]     Itching    Current Outpatient Medications on File Prior to Visit  Medication Sig   acetaminophen (TYLENOL) 650 MG CR tablet Take 650 mg by mouth every 8 (eight) hours as needed for pain.   albuterol (VENTOLIN HFA) 108 (90 Base) MCG/ACT inhaler Inhale 1-2 puffs into the lungs every 4 (four) hours as needed for wheezing or shortness of breath. Please give generic or the one that insurance covers   benzonatate (TESSALON) 200 MG capsule Take 1 tab three times a day as needed for cough.   buPROPion (WELLBUTRIN XL) 300 MG 24 hr tablet Take  1 tablet  Daily for Mood   cetirizine (ZYRTEC) 10 MG tablet Take 10 mg by mouth daily.   cholecalciferol (VITAMIN D3) 25 MCG (1000 UNIT) tablet Take 4,000 Units by  mouth daily.   Coenzyme Q10 (CO Q 10 PO) Take by mouth.   CYANOCOBALAMIN SL Place 1 tablet under the tongue daily. Unsure of dose, OTC   ezetimibe (ZETIA) 10 MG tablet Take  1 tablet  Daily  for Cholesterol   famotidine (PEPCID) 20 MG tablet Take  1 tablet  2 x/day  with Meals  to Prevent Indigestion & Heartburn   fenofibrate (TRICOR) 145 MG tablet Take  1 tablet  Daily  for Triglycerides (Blood Fats)   gabapentin (NEURONTIN) 300 MG capsule Take 1 capsule 1 hour  before Bedtime  as needed for Restless Legs & Sleep   lamoTRIgine (LAMICTAL) 100 MG tablet Take   1 tablet  Daily  to Stabilize Mood   Omega-3 1000 MG CAPS Take by mouth.   ondansetron (ZOFRAN-ODT) 8 MG disintegrating tablet DISSOLVE 1 TABLET ON THE  TONGUE EVERY 8 HOURS AS  NEEDED FOR NAUSEA AND  VOMITING   SUMAtriptan (IMITREX) 25 MG tablet TAKE 1-2 TABLETS BY MOUTH AT ONSET OF MIGRAINE MAY REPEAT IN 2 HOURS IF HEADACHE PERSISTS MAX OF 4 TABLETS PER 24 HR   No current facility-administered medications on file prior to visit.    ROS: all negative except above.   Physical Exam:  BP 124/82   Pulse 74   Temp (!) 96.9 F (36.1 C)   Wt 181 lb 3.2 oz (82.2 kg)   SpO2 97%   BMI 29.25 kg/m   General Appearance: Well nourished, in no apparent distress. Eyes: PERRLA, EOMs, conjunctiva no swelling or erythema Sinuses: No Frontal/maxillary tenderness ENT/Mouth: Ext aud canals clear, TMs without erythema, bulging, however left with cloudy appearance to fluid. Mild auricular tenderness. No erythema, swelling, or exudate on post pharynx.  Tonsils not swollen or erythematous. Rhinitis. Hearing normal.  Neck: Supple, thyroid normal.  Respiratory: Respiratory effort normal, BS equal bilaterally without rales, rhonchi, wheezing or stridor.  Cardio: RRR with no MRGs. Brisk peripheral pulses without edema.  Lymphatics: Non tender without lymphadenopathy.  Musculoskeletal: normal gait.  Skin: Warm, dry without rashes, lesions, ecchymosis.  Neuro: Cranial nerves intact. Normal muscle tone Psych: Awake and oriented X 3, normal affect, Insight and Judgment appropriate.     Izora Ribas, NP 2:54 PM [facility]

## 2021-02-19 ENCOUNTER — Telehealth: Payer: Self-pay | Admitting: *Deleted

## 2021-02-19 DIAGNOSIS — M9903 Segmental and somatic dysfunction of lumbar region: Secondary | ICD-10-CM | POA: Diagnosis not present

## 2021-02-19 DIAGNOSIS — M9904 Segmental and somatic dysfunction of sacral region: Secondary | ICD-10-CM | POA: Diagnosis not present

## 2021-02-19 DIAGNOSIS — M9905 Segmental and somatic dysfunction of pelvic region: Secondary | ICD-10-CM | POA: Diagnosis not present

## 2021-02-19 DIAGNOSIS — M9901 Segmental and somatic dysfunction of cervical region: Secondary | ICD-10-CM | POA: Diagnosis not present

## 2021-02-19 NOTE — Telephone Encounter (Signed)
closed

## 2021-02-22 DIAGNOSIS — M9903 Segmental and somatic dysfunction of lumbar region: Secondary | ICD-10-CM | POA: Diagnosis not present

## 2021-02-22 DIAGNOSIS — M9905 Segmental and somatic dysfunction of pelvic region: Secondary | ICD-10-CM | POA: Diagnosis not present

## 2021-02-22 DIAGNOSIS — M9904 Segmental and somatic dysfunction of sacral region: Secondary | ICD-10-CM | POA: Diagnosis not present

## 2021-02-22 DIAGNOSIS — M9901 Segmental and somatic dysfunction of cervical region: Secondary | ICD-10-CM | POA: Diagnosis not present

## 2021-02-25 DIAGNOSIS — M9903 Segmental and somatic dysfunction of lumbar region: Secondary | ICD-10-CM | POA: Diagnosis not present

## 2021-02-25 DIAGNOSIS — M25562 Pain in left knee: Secondary | ICD-10-CM | POA: Diagnosis not present

## 2021-02-25 DIAGNOSIS — M25561 Pain in right knee: Secondary | ICD-10-CM | POA: Diagnosis not present

## 2021-02-25 DIAGNOSIS — M9901 Segmental and somatic dysfunction of cervical region: Secondary | ICD-10-CM | POA: Diagnosis not present

## 2021-02-25 DIAGNOSIS — R531 Weakness: Secondary | ICD-10-CM | POA: Diagnosis not present

## 2021-02-25 DIAGNOSIS — M9904 Segmental and somatic dysfunction of sacral region: Secondary | ICD-10-CM | POA: Diagnosis not present

## 2021-02-25 DIAGNOSIS — M9905 Segmental and somatic dysfunction of pelvic region: Secondary | ICD-10-CM | POA: Diagnosis not present

## 2021-02-28 DIAGNOSIS — M9905 Segmental and somatic dysfunction of pelvic region: Secondary | ICD-10-CM | POA: Diagnosis not present

## 2021-02-28 DIAGNOSIS — M9904 Segmental and somatic dysfunction of sacral region: Secondary | ICD-10-CM | POA: Diagnosis not present

## 2021-02-28 DIAGNOSIS — M9903 Segmental and somatic dysfunction of lumbar region: Secondary | ICD-10-CM | POA: Diagnosis not present

## 2021-02-28 DIAGNOSIS — M9901 Segmental and somatic dysfunction of cervical region: Secondary | ICD-10-CM | POA: Diagnosis not present

## 2021-03-01 DIAGNOSIS — M25562 Pain in left knee: Secondary | ICD-10-CM | POA: Diagnosis not present

## 2021-03-01 DIAGNOSIS — R531 Weakness: Secondary | ICD-10-CM | POA: Diagnosis not present

## 2021-03-01 DIAGNOSIS — M25561 Pain in right knee: Secondary | ICD-10-CM | POA: Diagnosis not present

## 2021-03-04 DIAGNOSIS — M25561 Pain in right knee: Secondary | ICD-10-CM | POA: Diagnosis not present

## 2021-03-04 DIAGNOSIS — M9903 Segmental and somatic dysfunction of lumbar region: Secondary | ICD-10-CM | POA: Diagnosis not present

## 2021-03-04 DIAGNOSIS — R531 Weakness: Secondary | ICD-10-CM | POA: Diagnosis not present

## 2021-03-04 DIAGNOSIS — M9905 Segmental and somatic dysfunction of pelvic region: Secondary | ICD-10-CM | POA: Diagnosis not present

## 2021-03-04 DIAGNOSIS — M9901 Segmental and somatic dysfunction of cervical region: Secondary | ICD-10-CM | POA: Diagnosis not present

## 2021-03-04 DIAGNOSIS — M9904 Segmental and somatic dysfunction of sacral region: Secondary | ICD-10-CM | POA: Diagnosis not present

## 2021-03-04 DIAGNOSIS — M25562 Pain in left knee: Secondary | ICD-10-CM | POA: Diagnosis not present

## 2021-03-11 DIAGNOSIS — M25562 Pain in left knee: Secondary | ICD-10-CM | POA: Diagnosis not present

## 2021-03-11 DIAGNOSIS — M9901 Segmental and somatic dysfunction of cervical region: Secondary | ICD-10-CM | POA: Diagnosis not present

## 2021-03-11 DIAGNOSIS — M9904 Segmental and somatic dysfunction of sacral region: Secondary | ICD-10-CM | POA: Diagnosis not present

## 2021-03-11 DIAGNOSIS — M25561 Pain in right knee: Secondary | ICD-10-CM | POA: Diagnosis not present

## 2021-03-11 DIAGNOSIS — M9903 Segmental and somatic dysfunction of lumbar region: Secondary | ICD-10-CM | POA: Diagnosis not present

## 2021-03-11 DIAGNOSIS — R531 Weakness: Secondary | ICD-10-CM | POA: Diagnosis not present

## 2021-03-11 DIAGNOSIS — M9905 Segmental and somatic dysfunction of pelvic region: Secondary | ICD-10-CM | POA: Diagnosis not present

## 2021-03-12 DIAGNOSIS — M25561 Pain in right knee: Secondary | ICD-10-CM | POA: Diagnosis not present

## 2021-03-18 DIAGNOSIS — M9901 Segmental and somatic dysfunction of cervical region: Secondary | ICD-10-CM | POA: Diagnosis not present

## 2021-03-18 DIAGNOSIS — M9905 Segmental and somatic dysfunction of pelvic region: Secondary | ICD-10-CM | POA: Diagnosis not present

## 2021-03-18 DIAGNOSIS — M9904 Segmental and somatic dysfunction of sacral region: Secondary | ICD-10-CM | POA: Diagnosis not present

## 2021-03-18 DIAGNOSIS — M9903 Segmental and somatic dysfunction of lumbar region: Secondary | ICD-10-CM | POA: Diagnosis not present

## 2021-03-26 DIAGNOSIS — M9904 Segmental and somatic dysfunction of sacral region: Secondary | ICD-10-CM | POA: Diagnosis not present

## 2021-03-26 DIAGNOSIS — M9905 Segmental and somatic dysfunction of pelvic region: Secondary | ICD-10-CM | POA: Diagnosis not present

## 2021-03-26 DIAGNOSIS — M9903 Segmental and somatic dysfunction of lumbar region: Secondary | ICD-10-CM | POA: Diagnosis not present

## 2021-03-26 DIAGNOSIS — M9901 Segmental and somatic dysfunction of cervical region: Secondary | ICD-10-CM | POA: Diagnosis not present

## 2021-03-30 DIAGNOSIS — M25561 Pain in right knee: Secondary | ICD-10-CM | POA: Diagnosis not present

## 2021-04-01 DIAGNOSIS — M9905 Segmental and somatic dysfunction of pelvic region: Secondary | ICD-10-CM | POA: Diagnosis not present

## 2021-04-01 DIAGNOSIS — M9903 Segmental and somatic dysfunction of lumbar region: Secondary | ICD-10-CM | POA: Diagnosis not present

## 2021-04-01 DIAGNOSIS — M9901 Segmental and somatic dysfunction of cervical region: Secondary | ICD-10-CM | POA: Diagnosis not present

## 2021-04-01 DIAGNOSIS — M9904 Segmental and somatic dysfunction of sacral region: Secondary | ICD-10-CM | POA: Diagnosis not present

## 2021-04-05 DIAGNOSIS — M25562 Pain in left knee: Secondary | ICD-10-CM | POA: Diagnosis not present

## 2021-04-05 DIAGNOSIS — M170 Bilateral primary osteoarthritis of knee: Secondary | ICD-10-CM | POA: Diagnosis not present

## 2021-04-05 DIAGNOSIS — M25561 Pain in right knee: Secondary | ICD-10-CM | POA: Diagnosis not present

## 2021-04-08 DIAGNOSIS — M9905 Segmental and somatic dysfunction of pelvic region: Secondary | ICD-10-CM | POA: Diagnosis not present

## 2021-04-08 DIAGNOSIS — M9903 Segmental and somatic dysfunction of lumbar region: Secondary | ICD-10-CM | POA: Diagnosis not present

## 2021-04-08 DIAGNOSIS — M9901 Segmental and somatic dysfunction of cervical region: Secondary | ICD-10-CM | POA: Diagnosis not present

## 2021-04-08 DIAGNOSIS — M9904 Segmental and somatic dysfunction of sacral region: Secondary | ICD-10-CM | POA: Diagnosis not present

## 2021-04-15 ENCOUNTER — Encounter: Payer: Medicare HMO | Admitting: Physician Assistant

## 2021-04-15 DIAGNOSIS — M9904 Segmental and somatic dysfunction of sacral region: Secondary | ICD-10-CM | POA: Diagnosis not present

## 2021-04-15 DIAGNOSIS — M9903 Segmental and somatic dysfunction of lumbar region: Secondary | ICD-10-CM | POA: Diagnosis not present

## 2021-04-15 DIAGNOSIS — M9901 Segmental and somatic dysfunction of cervical region: Secondary | ICD-10-CM | POA: Diagnosis not present

## 2021-04-15 DIAGNOSIS — M9905 Segmental and somatic dysfunction of pelvic region: Secondary | ICD-10-CM | POA: Diagnosis not present

## 2021-04-16 NOTE — Progress Notes (Signed)
CPE  Assessment:    Encounter for Annual Physical Exam with abnormal findings Due annually  Health Maintenance reviewed Healthy lifestyle reviewed and goals set  Hyperlipidemia - On zetia, continue fenofibrate;  - LDL goal <100 -cont diet and exercise - Lipid panel   Hyperglycemia (prediabetes)  Discussed general issues about diabetes pathophysiology and management., Educational material distributed., Suggested low cholesterol diet., Encouraged aerobic exercise., Discussed foot care., Reminded to get yearly retinal exam.  - HbA1C   Essential hypertension - mild, currrently at goal off of medication with lifestyle, DASH diet, exercise and monitor at home. Call if greater than 130/80.    Vitamin D deficiency Supplement PRN Check vitamin D level  Osteopenia - get dexa q2y, UTD, continue Vit D and Ca, weight bearing exercises  ADD (attention deficit disorder) Off of medication since retirement;  Psych following  CKD 3 (HCC) Increase fluids, avoid NSAIDS, monitor sugars, will monitor - CMP/GFR, UA, microalbumin  Fatty liver Weight loss advised, avoid alcohol/tylenol, will monitor LFTs  OSA on CPAP Endorses 100 % compliance, weight loss encouraged  Gastroesophageal reflux disease, esophagitis presence not specified Continue H2i Discussed diet, avoiding triggers and other lifestyle changes  RLS (restless legs syndrome) Doing well on gabapentin; increase daily walking  Recurrent Major depression, chronic (HCC) Hx of severe recurrent depression, now managed by psych Stable Continue medications  Lifestyle discussed: diet/exerise, sleep hygiene, stress management, hydration  Overweight Long discussion about weight loss, diet, and exercise Recommended diet heavy in fruits and veggies and low in animal meats, cheeses, and dairy products, appropriate calorie intake Discussed appropriate weight for height  Follow up at next visit  Memory changes ? R/t severe  depression, neuro/psych following She will follow up with psych for management of severe mood Hallucinations resolved off of requip Discussed gabapentin Beer's list, try holding x 2 weeks and evaluate benefit with memory  Hypercalcemia Persistent off of supplement; does have hx of kidney stones, none recent Check PTH  Urinary urgency Intermittent sense of prolapse Insufficient time for pelvic today, will bring back for pelvic, check cystocele/prolapse Kegals helped in the past, wants to defer on meds, plan for pelvic floor PT referral  Orders Placed This Encounter  Procedures   CBC with Differential/Platelet   COMPLETE METABOLIC PANEL WITH GFR   Magnesium   Lipid panel   TSH   Hemoglobin A1c   VITAMIN D 25 Hydroxy (Vit-D Deficiency, Fractures)   Microalbumin / creatinine urine ratio   Urinalysis, Routine w reflex microscopic   Parathyroid hormone, intact (no Ca)   Vitamin B12   EKG 12-Lead   Over 40 minutes of exam, counseling, chart review and critical decision making was performed Future Appointments  Date Time Provider [place] [facility]  07/26/2021 10:30 AM Liane Comber, NP [facility] None  07/29/2021 11:15 AM [name], NP [facility] None  11/29/2021 11:00 AM Liane Comber, NP [facility] None  04/18/2022 10:00 AM Liane Comber, NP [facility] None     Plan:   During the course of the visit the patient was educated and counseled about appropriate screening and preventive services including:   Pneumococcal vaccine  Prevnar 13 Influenza vaccine Td vaccine Screening electrocardiogram Bone densitometry screening Colorectal cancer screening Diabetes screening Glaucoma screening Nutrition counseling  Advanced directives: requested   Subjective:  Kimberly Ray is a 70 y.o. female who presents for CPE. She has Mixed hyperlipidemia; ADD (attention deficit disorder); OSA on CPAP; Major depression, recurrent, chronic ([place]); Other abnormal glucose  (prediabetes); Vitamin D deficiency; GERD ;  Medication management; Overweight (BMI 25.0-29.9); Circadian rhythm sleep disorder; Periodic limb movement disorder (PLMD); Fatty liver; Osteopenia; CKD (chronic kidney disease) stage 3, GFR 30-59 ml/min ([place]); Memory changes; Gait abnormality; RLS (restless legs syndrome); B12 deficiency; Hypercalcemia; Seasonal allergies; and Urgency of urination on their problem list.  She is single, retired Programme researcher, broadcasting/film/video.   She has long hx of severe depression, recently on lamictal/wellbutrin, off of cymbalta, psych NP Butler Denmark has seen;   She was reporting memory changes, visual halluciations, unremarkable MRI 01/2020, was evaluated by Dr. Nicole Kindred for neuropsychological evaluation, felt the issue was more related to executive control function,  depression, aging, attention deficit disorder.  Felt neurodegeneration is less likely. Did recommend to stop requip for RLS which resolved hallucinations, controlled by gabapentin. She also feels memory improved off of requip.   Discussing right knee replacement with Dr. Wynelle Link.   She has OSA on CPAP, endorses 100% compliance and feels this has helped RLS. Typically wears around 7-8 hours each night.  Has GERD and well managed on famotidine BID.   BMI is Body mass index is 29.79 kg/m., she has been working on diet and exercise, she is working on [employer]. Has been doing higher protein/fat diet.  Wt Readings from Last 3 Encounters:  04/17/21 187 lb 6.4 oz (85 kg)  02/12/21 181 lb 3.2 oz (82.2 kg)  11/29/20 188 lb (85.3 kg)   Her BP is monitored expectantly with mild elevations intermittently. Never on medications.  She does not check BP at home, today their BP is BP: 132/80 She does workout. She denies chest pain, shortness of breath, dizziness.   She is on cholesterol medication (on zetia 10 mg, on fenofibrate 145 mg daily) and denies myalgias. Her cholesterol is not at goal. The cholesterol last visit  was:   Lab Results  Component Value Date   CHOL 174 11/29/2020   HDL 39 (L) 11/29/2020   LDLCALC 90 11/29/2020   TRIG 335 (H) 11/29/2020   CHOLHDL 4.5 11/29/2020   She has had prediabetes predating 2014. She has not been working on diet and exercise for prediabetes, and denies increased appetite, nausea, paresthesia of the feet, polydipsia, polyuria, visual disturbances, vomiting and weight loss. Last A1C in the office was:  Lab Results  Component Value Date   HGBA1C 6.1 (H) 11/29/2020   She has stable CKD IIIa monitored at this office. Last GFR: Lab Results  Component Value Date   GFRNONAA 50 (L) 11/29/2020   GFRNONAA 51 (L) 10/22/2020   GFRNONAA 32 (L) 10/03/2020   Patient is on Vitamin D supplement and was at goal at last check:    Lab Results  Component Value Date   VD25OH 53 08/23/2020     However has had persistent elevated calcium, no calcium supplement or tums, will check PTH Lab Results  Component Value Date   CALCIUM 10.8 (H) 11/29/2020   CAION 1.24 02/22/2011    She stopped B12 supplement -  Lab Results  Component Value Date   VITAMINB12 1,700 (H) 11/29/2020      Medication Review: Current Outpatient Medications on File Prior to Visit  Medication Sig Dispense Refill   acetaminophen (TYLENOL) 650 MG CR tablet Take 650 mg by mouth every 8 (eight) hours as needed for pain.     albuterol (VENTOLIN HFA) 108 (90 Base) MCG/ACT inhaler Inhale 1-2 puffs into the lungs every 4 (four) hours as needed for wheezing or shortness of breath. Please give generic or the one that insurance covers  1 each 0   benzonatate (TESSALON) 200 MG capsule Take 1 tab three times a day as needed for cough. 90 capsule 1   buPROPion (WELLBUTRIN XL) 300 MG 24 hr tablet Take  1 tablet  Daily for Mood 90 tablet 3   Celecoxib (CELEBREX PO) Take by mouth as needed.     cetirizine (ZYRTEC) 10 MG tablet Take 10 mg by mouth daily.     Coenzyme Q10 (CO Q 10 PO) Take by mouth.     famotidine  (PEPCID) 20 MG tablet Take  1 tablet  2 x/day  with Meals  to Prevent Indigestion & Heartburn 180 tablet 3   fenofibrate (TRICOR) 145 MG tablet Take  1 tablet  Daily  for Triglycerides (Blood Fats) 90 tablet 3   gabapentin (NEURONTIN) 300 MG capsule Take 1 capsule 1 hour  before Bedtime  as needed for Restless Legs & Sleep 90 capsule 3   lamoTRIgine (LAMICTAL) 100 MG tablet Take  1 tablet  Daily  to Stabilize Mood 90 tablet 3   Omega-3 1000 MG CAPS Take by mouth.     ondansetron (ZOFRAN-ODT) 8 MG disintegrating tablet DISSOLVE 1 TABLET ON THE  TONGUE EVERY 8 HOURS AS  NEEDED FOR NAUSEA AND  VOMITING 60 tablet 0   SUCRALFATE PO Take by mouth as needed.     SUMAtriptan (IMITREX) 25 MG tablet TAKE 1-2 TABLETS BY MOUTH AT ONSET OF MIGRAINE MAY REPEAT IN 2 HOURS IF HEADACHE PERSISTS MAX OF 4 TABLETS PER 24 HR 30 tablet 0   cholecalciferol (VITAMIN D3) 25 MCG (1000 UNIT) tablet Take 4,000 Units by mouth daily. (Patient not taking: Reported on 04/17/2021)     CYANOCOBALAMIN SL Place 1 tablet under the tongue daily. Unsure of dose, OTC (Patient not taking: Reported on 04/17/2021)     doxycycline (VIBRAMYCIN) 100 MG capsule Take 1 capsule 2 x/day with food for 10 days. 20 capsule 0   ezetimibe (ZETIA) 10 MG tablet Take  1 tablet  Daily  for Cholesterol 90 tablet 3   fluticasone (FLONASE) 50 MCG/ACT nasal spray Place 1-2 sprays into both nostrils at bedtime. (Patient not taking: Reported on 04/17/2021) 16 g 1   No current facility-administered medications on file prior to visit.    Allergies  Allergen Reactions   Erythromycin Other (See Comments)    TBS   Iodinated Diagnostic Agents Other (See Comments)    Hot flashes, itching scalp Hot flashes, itching scalp   Penicillins Hives   Clarithromycin Other (See Comments)    Itching ankles   Brintellix [Vortioxetine]     Itching    Current Problems (verified) Patient Active Problem List   Diagnosis Date Noted   Urgency of urination 04/17/2021    Seasonal allergies 02/12/2021   Hypercalcemia 11/30/2020   B12 deficiency 04/12/2020   Gait abnormality 12/26/2019   RLS (restless legs syndrome) 12/26/2019   Memory changes 08/04/2019   CKD (chronic kidney disease) stage 3, GFR 30-59 ml/min (HCC) 04/11/2019   Osteopenia 04/06/2018   Fatty liver 08/29/2017   Circadian rhythm sleep disorder 03/25/2017   Periodic limb movement disorder (PLMD) 03/25/2017   Overweight (BMI 25.0-29.9) 06/16/2015   Medication management 06/15/2015   Other abnormal glucose (prediabetes) 11/09/2014   Vitamin D deficiency 11/09/2014   GERD  11/09/2014   Major depression, recurrent, chronic ([place]) 06/07/2014   OSA on CPAP    Mixed hyperlipidemia 06/29/2013   ADD (attention deficit disorder) 06/29/2013    Screening Tests Immunization History  Administered  Date(s) Administered   Influenza, High Dose Seasonal PF 06/12/2016, 05/13/2017, 06/25/2020   Influenza-Unspecified 06/02/2015, 05/19/2018, 05/19/2019   PFIZER(Purple Top)SARS-COV-2 Vaccination 09/12/2019, 10/03/2019, 04/30/2020   PPD Test 08/19/2011, 08/30/2013, 11/09/2014   Pneumococcal Conjugate-13 08/18/2009, 02/18/2019   Pneumococcal Polysaccharide-23 06/12/2016   Td 08/19/2003   Tdap 08/30/2013   Zoster, Live 08/18/2010   Tetanus: 2015 Pneumovax: 2017 Prevnar: 2011, 2020 Flu vaccine: 2021 Zostavax: 2012 - check with insurance about shingrix Covid 19: 3/3, 2021, pfizer  PAP: 2019, s/p TAH, done  MGM: 12/2020 R breast likely cyst -1 year follow up, has seen surgeon, did not recommend surgery  DEXA: 08/2020 - L fem T -2.3     Colonoscopy: 09/2011 Dr Hilarie Fredrickson, 10 year EGD: 2019  Names of Other Physician/Practitioners you currently use: 1. [facility] here for primary care 2. Dr. Idolina Primer, eye doctor, last visit 2022 3. Dr. Tama Gander, dentist, last visit 2020, overdue, looking for new provider  Patient Care Team: Unk Pinto, MD as PCP - General (Internal  Medicine) Clarene Essex, MD as Consulting Physician (Gastroenterology) Royston Sinner Colin Benton, MD as Consulting Physician (Obstetrics and Gynecology)  SURGICAL HISTORY She  has a past surgical history that includes perforated eardrum; Nasal sinus surgery; Tonsillectomy and adenoidectomy; Breast biopsy (Right, 02/27/2014); Cholecystectomy (11/03/2017); Laparoscopic hysterectomy (12/2017); Ureteroscopy; ORIF ankle fracture bimalleolar (Left, 03/14/2019); and Abdominal hysterectomy. FAMILY HISTORY Her family history includes Anxiety disorder in her brother, father, and sister; Bipolar disorder in her brother; Breast cancer in her cousin; Breast cancer (age of onset: 87) in her maternal aunt; Breast cancer (age of onset: 87) in her maternal grandmother; Colon cancer (age of onset: 2) in her paternal grandmother; Dementia in her father; Depression in her sister; Diabetes in her mother and sister; Heart failure in her father; Heart failure (age of onset: 93) in her maternal grandfather; Mental illness in her paternal grandmother; Mood Disorder in her father; Osteoporosis in her brother; Stomach cancer in her maternal aunt. SOCIAL HISTORY She  reports that she has never smoked. She has never used smokeless tobacco. She reports that she does not currently use alcohol. She reports that she does not use drugs.   Review of Systems  Constitutional:  Negative for malaise/fatigue and weight loss.  HENT:  Negative for hearing loss and tinnitus.   Eyes:  Negative for blurred vision and double vision.  Respiratory:  Negative for cough, sputum production, shortness of breath and wheezing.   Cardiovascular:  Negative for chest pain, palpitations, orthopnea, claudication, leg swelling and PND.  Gastrointestinal:  Negative for abdominal pain, blood in stool, constipation, diarrhea, heartburn (improved, avoiding triggers), melena, nausea and vomiting.  Genitourinary:  Positive for urgency (intermittent, somewhat  progressive). Negative for dysuria, flank pain, frequency and hematuria.       Intermittent sense of prolapse/fullness  Musculoskeletal:  Negative for falls, joint pain and myalgias.  Skin:  Negative for rash.  Neurological:  Negative for dizziness, tingling, sensory change, weakness and headaches.  Endo/Heme/Allergies:  Negative for polydipsia.  Psychiatric/Behavioral:  Positive for depression and memory loss. Negative for hallucinations (improved off of requip), substance abuse and suicidal ideas. The patient is not nervous/anxious and does not have insomnia.   All other systems reviewed and are negative.   Objective:     Today's Vitals   04/17/21 0953  BP: 132/80  Pulse: 81  Temp: (!) 97.5 F (36.4 C)  SpO2: 99%  Weight: 187 lb 6.4 oz (85 kg)  Height: 5' 6.5" (1.689 m)  Body mass index is 29.79 kg/m.  General appearance: alert, no distress, WD/WN, female HEENT: normocephalic, sclerae anicteric, TMs pearly, nares patent, no discharge or erythema, pharynx normal Oral cavity: MMM, no lesions Neck: supple, no lymphadenopathy, no thyromegaly, no masses Heart: RRR, normal S1, S2, no murmurs Lungs: CTA bilaterally, no wheezes, rhonchi, or rales Abdomen: +bs, soft, non tender, non distended, no masses, no hepatomegaly, no splenomegaly Musculoskeletal: nontender, no swelling, no obvious deformity, gait is slow and antalgic Extremities: no edema, no cyanosis, no clubbing Pulses: 2+ symmetric, upper and lower extremities, normal cap refill Neurological: alert, oriented x 3, CN2-12 intact, strength normal upper extremities and lower extremities, sensation normal throughout, DTRs 2+ throughout, no cerebellar signs, gait slow and antalgic  Psychiatric: Depressed affect, behavior normal, pleasant Breasts: getting regular mammograms, no new concerns GU: insufficient time, will bring back close follow up  EKG: WNL, No ST changes, junctional rhythm   Izora Ribas, NP   04/17/2021

## 2021-04-17 ENCOUNTER — Encounter: Payer: Self-pay | Admitting: Adult Health

## 2021-04-17 ENCOUNTER — Ambulatory Visit (INDEPENDENT_AMBULATORY_CARE_PROVIDER_SITE_OTHER): Payer: Medicare Other | Admitting: Adult Health

## 2021-04-17 ENCOUNTER — Other Ambulatory Visit: Payer: Self-pay

## 2021-04-17 VITALS — BP 132/80 | HR 81 | Temp 97.5°F | Ht 66.5 in | Wt 187.4 lb

## 2021-04-17 DIAGNOSIS — Z0000 Encounter for general adult medical examination without abnormal findings: Secondary | ICD-10-CM | POA: Diagnosis not present

## 2021-04-17 DIAGNOSIS — Z79899 Other long term (current) drug therapy: Secondary | ICD-10-CM | POA: Diagnosis not present

## 2021-04-17 DIAGNOSIS — K760 Fatty (change of) liver, not elsewhere classified: Secondary | ICD-10-CM

## 2021-04-17 DIAGNOSIS — M8580 Other specified disorders of bone density and structure, unspecified site: Secondary | ICD-10-CM

## 2021-04-17 DIAGNOSIS — R030 Elevated blood-pressure reading, without diagnosis of hypertension: Secondary | ICD-10-CM

## 2021-04-17 DIAGNOSIS — E782 Mixed hyperlipidemia: Secondary | ICD-10-CM

## 2021-04-17 DIAGNOSIS — R3915 Urgency of urination: Secondary | ICD-10-CM

## 2021-04-17 DIAGNOSIS — F339 Major depressive disorder, recurrent, unspecified: Secondary | ICD-10-CM

## 2021-04-17 DIAGNOSIS — Z136 Encounter for screening for cardiovascular disorders: Secondary | ICD-10-CM | POA: Diagnosis not present

## 2021-04-17 DIAGNOSIS — E538 Deficiency of other specified B group vitamins: Secondary | ICD-10-CM | POA: Diagnosis not present

## 2021-04-17 DIAGNOSIS — N1831 Chronic kidney disease, stage 3a: Secondary | ICD-10-CM | POA: Diagnosis not present

## 2021-04-17 DIAGNOSIS — E559 Vitamin D deficiency, unspecified: Secondary | ICD-10-CM

## 2021-04-17 DIAGNOSIS — G4733 Obstructive sleep apnea (adult) (pediatric): Secondary | ICD-10-CM

## 2021-04-17 DIAGNOSIS — E663 Overweight: Secondary | ICD-10-CM

## 2021-04-17 DIAGNOSIS — F988 Other specified behavioral and emotional disorders with onset usually occurring in childhood and adolescence: Secondary | ICD-10-CM

## 2021-04-17 DIAGNOSIS — R7309 Other abnormal glucose: Secondary | ICD-10-CM

## 2021-04-17 DIAGNOSIS — Z0001 Encounter for general adult medical examination with abnormal findings: Secondary | ICD-10-CM

## 2021-04-17 DIAGNOSIS — J302 Other seasonal allergic rhinitis: Secondary | ICD-10-CM

## 2021-04-18 LAB — CBC WITH DIFFERENTIAL/PLATELET
Absolute Monocytes: 595 cells/uL (ref 200–950)
Basophils Absolute: 50 cells/uL (ref 0–200)
Basophils Relative: 0.8 %
Eosinophils Absolute: 99 cells/uL (ref 15–500)
Eosinophils Relative: 1.6 %
HCT: 40.1 % (ref 35.0–45.0)
Hemoglobin: 13.2 g/dL (ref 11.7–15.5)
Lymphs Abs: 1872 cells/uL (ref 850–3900)
MCH: 28.6 pg (ref 27.0–33.0)
MCHC: 32.9 g/dL (ref 32.0–36.0)
MCV: 87 fL (ref 80.0–100.0)
MPV: 10.5 fL (ref 7.5–12.5)
Monocytes Relative: 9.6 %
Neutro Abs: 3584 cells/uL (ref 1500–7800)
Neutrophils Relative %: 57.8 %
Platelets: 312 10*3/uL (ref 140–400)
RBC: 4.61 10*6/uL (ref 3.80–5.10)
RDW: 12.5 % (ref 11.0–15.0)
Total Lymphocyte: 30.2 %
WBC: 6.2 10*3/uL (ref 3.8–10.8)

## 2021-04-18 LAB — LIPID PANEL
Cholesterol: 180 mg/dL (ref <200)
HDL: 37 mg/dL — ABNORMAL LOW (ref >=50)
LDL Cholesterol (Calc): 92 mg/dL (calc)
Non-HDL Cholesterol (Calc): 143 mg/dL (calc) — ABNORMAL HIGH (ref <130)
Total CHOL/HDL Ratio: 4.9 (calc) (ref <5.0)
Triglycerides: 383 mg/dL — ABNORMAL HIGH (ref <150)

## 2021-04-18 LAB — COMPLETE METABOLIC PANEL WITH GFR
AG Ratio: 2.1 (calc) (ref 1.0–2.5)
ALT: 29 U/L (ref 6–29)
AST: 52 U/L — ABNORMAL HIGH (ref 10–35)
Albumin: 4.9 g/dL (ref 3.6–5.1)
Alkaline phosphatase (APISO): 70 U/L (ref 37–153)
BUN/Creatinine Ratio: 19 (calc) (ref 6–22)
BUN: 21 mg/dL (ref 7–25)
CO2: 25 mmol/L (ref 20–32)
Calcium: 10.5 mg/dL — ABNORMAL HIGH (ref 8.6–10.4)
Chloride: 104 mmol/L (ref 98–110)
Creat: 1.09 mg/dL — ABNORMAL HIGH (ref 0.60–1.00)
Globulin: 2.3 g/dL (calc) (ref 1.9–3.7)
Glucose, Bld: 98 mg/dL (ref 65–99)
Potassium: 4.6 mmol/L (ref 3.5–5.3)
Sodium: 138 mmol/L (ref 135–146)
Total Bilirubin: 0.6 mg/dL (ref 0.2–1.2)
Total Protein: 7.2 g/dL (ref 6.1–8.1)
eGFR: 54 mL/min/{1.73_m2} — ABNORMAL LOW (ref >=60)

## 2021-04-18 LAB — URINALYSIS, ROUTINE W REFLEX MICROSCOPIC
Bilirubin Urine: NEGATIVE
Glucose, UA: NEGATIVE
Hgb urine dipstick: NEGATIVE
Ketones, ur: NEGATIVE
Leukocytes,Ua: NEGATIVE
Nitrite: NEGATIVE
Protein, ur: NEGATIVE
Specific Gravity, Urine: 1.009 (ref 1.001–1.035)
pH: <=5 (ref 5.0–8.0)

## 2021-04-18 LAB — MAGNESIUM: Magnesium: 1.9 mg/dL (ref 1.5–2.5)

## 2021-04-18 LAB — MICROALBUMIN / CREATININE URINE RATIO
Creatinine, Urine: 43 mg/dL (ref 20–275)
Microalb Creat Ratio: 7 mcg/mg creat (ref <30)
Microalb, Ur: 0.3 mg/dL

## 2021-04-18 LAB — HEMOGLOBIN A1C
Hgb A1c MFr Bld: 6 % of total Hgb — ABNORMAL HIGH (ref <5.7)
Mean Plasma Glucose: 126 mg/dL
eAG (mmol/L): 7 mmol/L

## 2021-04-18 LAB — PARATHYROID HORMONE, INTACT (NO CA): PTH: 17 pg/mL (ref 16–77)

## 2021-04-18 LAB — TSH: TSH: 3.59 mIU/L (ref 0.40–4.50)

## 2021-04-18 LAB — VITAMIN B12: Vitamin B-12: 369 pg/mL (ref 200–1100)

## 2021-04-18 LAB — VITAMIN D 25 HYDROXY (VIT D DEFICIENCY, FRACTURES): Vit D, 25-Hydroxy: 36 ng/mL (ref 30–100)

## 2021-04-23 DIAGNOSIS — M9901 Segmental and somatic dysfunction of cervical region: Secondary | ICD-10-CM | POA: Diagnosis not present

## 2021-04-23 DIAGNOSIS — M9905 Segmental and somatic dysfunction of pelvic region: Secondary | ICD-10-CM | POA: Diagnosis not present

## 2021-04-23 DIAGNOSIS — M9904 Segmental and somatic dysfunction of sacral region: Secondary | ICD-10-CM | POA: Diagnosis not present

## 2021-04-23 DIAGNOSIS — M9903 Segmental and somatic dysfunction of lumbar region: Secondary | ICD-10-CM | POA: Diagnosis not present

## 2021-04-29 DIAGNOSIS — M9901 Segmental and somatic dysfunction of cervical region: Secondary | ICD-10-CM | POA: Diagnosis not present

## 2021-04-29 DIAGNOSIS — M9904 Segmental and somatic dysfunction of sacral region: Secondary | ICD-10-CM | POA: Diagnosis not present

## 2021-04-29 DIAGNOSIS — M9903 Segmental and somatic dysfunction of lumbar region: Secondary | ICD-10-CM | POA: Diagnosis not present

## 2021-04-29 DIAGNOSIS — M9905 Segmental and somatic dysfunction of pelvic region: Secondary | ICD-10-CM | POA: Diagnosis not present

## 2021-04-29 NOTE — Progress Notes (Deleted)
Assessment and Plan:  There are no diagnoses linked to this encounter.    Further disposition pending results of labs. Discussed med's effects and SE's.   Over 30 minutes of exam, counseling, chart review, and critical decision making was performed.   Future Appointments  Date Time Provider [place]  04/30/2021  9:30 AM Magda Bernheim, NP [facility] None  07/26/2021 10:30 AM Liane Comber, NP [facility] None  07/29/2021 11:15 AM [name], NP [facility] None  11/29/2021 11:00 AM Liane Comber, NP [facility] None  04/18/2022 10:00 AM Liane Comber, NP [facility] None    ------------------------------------------------------------------------------------------------------------------   HPI There were no vitals taken for this visit. 70 y.o.female presents for  Past Medical History:  Diagnosis Date   Adnexal mass 04/08/2020   Anemia    Depression    Extrinsic asthma 11/09/2014   Gait abnormality 12/26/2019   GERD (gastroesophageal reflux disease)    Hay fever    Head trauma    IBS (irritable bowel syndrome)    Memory loss    Migraines    OSA on CPAP    Reflux    RLS (restless legs syndrome)    Sinus congestion      Allergies  Allergen Reactions   Erythromycin Other (See Comments)    TBS   Iodinated Diagnostic Agents Other (See Comments)    Hot flashes, itching scalp Hot flashes, itching scalp   Penicillins Hives   Clarithromycin Other (See Comments)    Itching ankles   Brintellix [Vortioxetine]     Itching    Current Outpatient Medications on File Prior to Visit  Medication Sig   acetaminophen (TYLENOL) 650 MG CR tablet Take 650 mg by mouth every 8 (eight) hours as needed for pain.   albuterol (VENTOLIN HFA) 108 (90 Base) MCG/ACT inhaler Inhale 1-2 puffs into the lungs every 4 (four) hours as needed for wheezing or shortness of breath. Please give generic or the one that insurance covers   benzonatate (TESSALON) 200 MG capsule Take 1 tab three times a  day as needed for cough.   buPROPion (WELLBUTRIN XL) 300 MG 24 hr tablet Take  1 tablet  Daily for Mood   Celecoxib (CELEBREX PO) Take by mouth as needed.   cetirizine (ZYRTEC) 10 MG tablet Take 10 mg by mouth daily.   cholecalciferol (VITAMIN D3) 25 MCG (1000 UNIT) tablet Take 4,000 Units by mouth daily. (Patient not taking: Reported on 04/17/2021)   Coenzyme Q10 (CO Q 10 PO) Take by mouth.   CYANOCOBALAMIN SL Place 1 tablet under the tongue daily. Unsure of dose, OTC (Patient not taking: Reported on 04/17/2021)   doxycycline (VIBRAMYCIN) 100 MG capsule Take 1 capsule 2 x/day with food for 10 days.   ezetimibe (ZETIA) 10 MG tablet Take  1 tablet  Daily  for Cholesterol   famotidine (PEPCID) 20 MG tablet Take  1 tablet  2 x/day  with Meals  to Prevent Indigestion & Heartburn   fenofibrate (TRICOR) 145 MG tablet Take  1 tablet  Daily  for Triglycerides (Blood Fats)   fluticasone (FLONASE) 50 MCG/ACT nasal spray Place 1-2 sprays into both nostrils at bedtime. (Patient not taking: Reported on 04/17/2021)   gabapentin (NEURONTIN) 300 MG capsule Take 1 capsule 1 hour  before Bedtime  as needed for Restless Legs & Sleep   lamoTRIgine (LAMICTAL) 100 MG tablet Take  1 tablet  Daily  to Stabilize Mood   Omega-3 1000 MG CAPS Take by mouth.   ondansetron (ZOFRAN-ODT)  8 MG disintegrating tablet DISSOLVE 1 TABLET ON THE  TONGUE EVERY 8 HOURS AS  NEEDED FOR NAUSEA AND  VOMITING   SUCRALFATE PO Take by mouth as needed.   SUMAtriptan (IMITREX) 25 MG tablet TAKE 1-2 TABLETS BY MOUTH AT ONSET OF MIGRAINE MAY REPEAT IN 2 HOURS IF HEADACHE PERSISTS MAX OF 4 TABLETS PER 24 HR   No current facility-administered medications on file prior to visit.    ROS: all negative except above.   Physical Exam:  There were no vitals taken for this visit.  General Appearance: Well nourished, in no apparent distress. Eyes: PERRLA, EOMs, conjunctiva no swelling or erythema Sinuses: No Frontal/maxillary tenderness ENT/Mouth:  Ext aud canals clear, TMs without erythema, bulging. No erythema, swelling, or exudate on post pharynx.  Tonsils not swollen or erythematous. Hearing normal.  Neck: Supple, thyroid normal.  Respiratory: Respiratory effort normal, BS equal bilaterally without rales, rhonchi, wheezing or stridor.  Cardio: RRR with no MRGs. Brisk peripheral pulses without edema.  Abdomen: Soft, + BS.  Non tender, no guarding, rebound, hernias, masses. Lymphatics: Non tender without lymphadenopathy.  Musculoskeletal: Full ROM, 5/5 strength, normal gait.  Skin: Warm, dry without rashes, lesions, ecchymosis.  Neuro: Cranial nerves intact. Normal muscle tone, no cerebellar symptoms. Sensation intact.  Psych: Awake and oriented X 3, normal affect, Insight and Judgment appropriate.     Magda Bernheim, NP 5:50 PM [facility]

## 2021-04-30 ENCOUNTER — Other Ambulatory Visit: Payer: Self-pay

## 2021-04-30 ENCOUNTER — Ambulatory Visit: Payer: Medicare Other | Admitting: Nurse Practitioner

## 2021-04-30 DIAGNOSIS — Z20822 Contact with and (suspected) exposure to covid-19: Secondary | ICD-10-CM | POA: Diagnosis not present

## 2021-05-02 DIAGNOSIS — M170 Bilateral primary osteoarthritis of knee: Secondary | ICD-10-CM | POA: Diagnosis not present

## 2021-05-06 ENCOUNTER — Other Ambulatory Visit (HOSPITAL_BASED_OUTPATIENT_CLINIC_OR_DEPARTMENT_OTHER): Payer: Self-pay

## 2021-05-08 ENCOUNTER — Other Ambulatory Visit (HOSPITAL_BASED_OUTPATIENT_CLINIC_OR_DEPARTMENT_OTHER): Payer: Self-pay

## 2021-05-08 ENCOUNTER — Ambulatory Visit: Payer: Medicare Other | Attending: Internal Medicine

## 2021-05-08 DIAGNOSIS — Z23 Encounter for immunization: Secondary | ICD-10-CM

## 2021-05-08 MED ORDER — PFIZER COVID-19 VAC BIVALENT 30 MCG/0.3ML IM SUSP
INTRAMUSCULAR | 0 refills | Status: DC
  Filled 2021-05-08: qty 0.3, 1d supply, fill #0

## 2021-05-08 NOTE — Progress Notes (Signed)
   Covid-19 Vaccination Clinic  Name:  Kimberly Ray    MRN: [id number] DOB: [birth year]  05/08/2021  Ms. Pen was observed post Covid-19 immunization for 15 minutes without incident. She was provided with Vaccine Information Sheet and instruction to access the V-Safe system.   [name] was instructed to call 911 with any severe reactions post vaccine: Difficulty breathing  Swelling of face and throat  A fast heartbeat  A bad rash all over body  Dizziness and weakness

## 2021-05-14 DIAGNOSIS — M9905 Segmental and somatic dysfunction of pelvic region: Secondary | ICD-10-CM | POA: Diagnosis not present

## 2021-05-14 DIAGNOSIS — M9903 Segmental and somatic dysfunction of lumbar region: Secondary | ICD-10-CM | POA: Diagnosis not present

## 2021-05-14 DIAGNOSIS — M9901 Segmental and somatic dysfunction of cervical region: Secondary | ICD-10-CM | POA: Diagnosis not present

## 2021-05-14 DIAGNOSIS — M9904 Segmental and somatic dysfunction of sacral region: Secondary | ICD-10-CM | POA: Diagnosis not present

## 2021-05-20 ENCOUNTER — Other Ambulatory Visit: Payer: Self-pay | Admitting: Internal Medicine

## 2021-05-20 DIAGNOSIS — F339 Major depressive disorder, recurrent, unspecified: Secondary | ICD-10-CM

## 2021-05-20 MED ORDER — ARIPIPRAZOLE 5 MG PO TABS: 5.0000 mg | ORAL_TABLET | Freq: Every day | ORAL | 0 refills | Status: DC

## 2021-05-27 ENCOUNTER — Other Ambulatory Visit: Payer: Self-pay | Admitting: Nurse Practitioner

## 2021-05-27 DIAGNOSIS — K219 Gastro-esophageal reflux disease without esophagitis: Secondary | ICD-10-CM

## 2021-05-27 MED ORDER — OMEPRAZOLE 20 MG PO CPDR: DELAYED_RELEASE_CAPSULE | ORAL | 3 refills | Status: DC

## 2021-06-05 DIAGNOSIS — M9903 Segmental and somatic dysfunction of lumbar region: Secondary | ICD-10-CM | POA: Diagnosis not present

## 2021-06-05 DIAGNOSIS — M9905 Segmental and somatic dysfunction of pelvic region: Secondary | ICD-10-CM | POA: Diagnosis not present

## 2021-06-05 DIAGNOSIS — M9904 Segmental and somatic dysfunction of sacral region: Secondary | ICD-10-CM | POA: Diagnosis not present

## 2021-06-05 DIAGNOSIS — M9901 Segmental and somatic dysfunction of cervical region: Secondary | ICD-10-CM | POA: Diagnosis not present

## 2021-06-18 DIAGNOSIS — M9901 Segmental and somatic dysfunction of cervical region: Secondary | ICD-10-CM | POA: Diagnosis not present

## 2021-06-18 DIAGNOSIS — M9905 Segmental and somatic dysfunction of pelvic region: Secondary | ICD-10-CM | POA: Diagnosis not present

## 2021-06-18 DIAGNOSIS — M9904 Segmental and somatic dysfunction of sacral region: Secondary | ICD-10-CM | POA: Diagnosis not present

## 2021-06-18 DIAGNOSIS — M9903 Segmental and somatic dysfunction of lumbar region: Secondary | ICD-10-CM | POA: Diagnosis not present

## 2021-07-08 ENCOUNTER — Other Ambulatory Visit: Payer: Self-pay | Admitting: Adult Health

## 2021-07-08 ENCOUNTER — Other Ambulatory Visit: Payer: Self-pay | Admitting: Adult Health Nurse Practitioner

## 2021-07-10 ENCOUNTER — Other Ambulatory Visit: Payer: Self-pay | Admitting: Internal Medicine

## 2021-07-10 ENCOUNTER — Other Ambulatory Visit: Payer: Self-pay | Admitting: Nurse Practitioner

## 2021-07-10 ENCOUNTER — Encounter: Payer: Self-pay | Admitting: Adult Health

## 2021-07-10 ENCOUNTER — Other Ambulatory Visit: Payer: Self-pay

## 2021-07-10 DIAGNOSIS — F339 Major depressive disorder, recurrent, unspecified: Secondary | ICD-10-CM

## 2021-07-10 MED ORDER — ARIPIPRAZOLE 5 MG PO TABS
5.0000 mg | ORAL_TABLET | Freq: Every day | ORAL | 0 refills | Status: DC
  Filled 2021-07-10: qty 30, 30d supply, fill #0

## 2021-07-10 MED ORDER — ARIPIPRAZOLE 5 MG PO TABS: 5.0000 mg | ORAL_TABLET | Freq: Every day | ORAL | 0 refills | Status: DC

## 2021-07-12 ENCOUNTER — Other Ambulatory Visit: Payer: Self-pay | Admitting: Internal Medicine

## 2021-07-12 ENCOUNTER — Encounter: Payer: Self-pay | Admitting: Internal Medicine

## 2021-07-12 DIAGNOSIS — Z20822 Contact with and (suspected) exposure to covid-19: Secondary | ICD-10-CM | POA: Diagnosis not present

## 2021-07-12 MED ORDER — DEXAMETHASONE 4 MG PO TABS: ORAL_TABLET | ORAL | 0 refills | Status: DC

## 2021-07-13 DIAGNOSIS — R059 Cough, unspecified: Secondary | ICD-10-CM | POA: Diagnosis not present

## 2021-07-13 DIAGNOSIS — Z20822 Contact with and (suspected) exposure to covid-19: Secondary | ICD-10-CM | POA: Diagnosis not present

## 2021-07-15 ENCOUNTER — Other Ambulatory Visit: Payer: Self-pay

## 2021-07-15 ENCOUNTER — Encounter: Payer: Self-pay | Admitting: Adult Health

## 2021-07-17 ENCOUNTER — Telehealth: Payer: Self-pay | Admitting: Neurology

## 2021-07-17 NOTE — Telephone Encounter (Signed)
I rescheduled appointment on 12/12 because Judson Roch will be off. I sent a mychart message letting her know of her new appointment time. If they call back to reschedule, there are more days held in January.

## 2021-07-26 ENCOUNTER — Ambulatory Visit: Payer: Medicare Other | Admitting: Adult Health

## 2021-07-29 ENCOUNTER — Ambulatory Visit: Payer: Medicare Other | Admitting: Neurology

## 2021-07-29 NOTE — Progress Notes (Deleted)
FOLLOW UP  Assessment and Plan:   Hypertension Well controlled with current medications  Monitor blood pressure at home; patient to call if consistently greater than 130/80 Continue DASH diet.   Reminder to go to the ER if any CP, SOB, nausea, dizziness, severe HA, changes vision/speech, left arm numbness and tingling and jaw pain.  Cholesterol Currently at goal;  Continue low cholesterol diet and exercise.  Check lipid panel.   Diabetes {with or without complications:30421263} Continue medication: Continue diet and exercise.  Perform daily foot/skin check, notify office of any concerning changes.  Check A1C  Obesity with co morbidities Long discussion about weight loss, diet, and exercise Recommended diet heavy in fruits and veggies and low in animal meats, cheeses, and dairy products, appropriate calorie intake Discussed ideal weight for height (below ***) and initial weight goal (***) Patient will work on *** Will follow up in 3 months  Vitamin D Def At goal at last visit; continue supplementation to maintain goal of 60-100 Defer Vit D level  Continue diet and meds as discussed. Further disposition pending results of labs. Discussed med's effects and SE's.   Over 30 minutes of exam, counseling, chart review, and critical decision making was performed.   Future Appointments  Date Time Provider [place]  07/30/2021  2:00 PM Magda Bernheim, NP [facility] None  08/15/2021  9:15 AM [name], NP [facility] None  11/29/2021 11:00 AM Liane Comber, NP [facility] None  04/18/2022 10:00 AM Liane Comber, NP [facility] None    ----------------------------------------------------------------------------------------------------------------------  HPI 70 y.o. female  presents for 3 month follow up on hypertension, cholesterol, diabetes, weight and vitamin D deficiency.   BMI is There is no height or weight on file to calculate BMI., she {HAS HAS MLY:65035} been  working on diet and exercise. Wt Readings from Last 3 Encounters:  04/17/21 187 lb 6.4 oz (85 kg)  02/12/21 181 lb 3.2 oz (82.2 kg)  11/29/20 188 lb (85.3 kg)    Her blood pressure has been controlled at home, today their BP is   BP Readings from Last 3 Encounters:  04/17/21 132/80  02/12/21 124/82  11/29/20 114/72     She {DOES_DOES WSF:68127} workout. She denies chest pain, shortness of breath, dizziness.   She is on cholesterol medication Zetia and Fenofibrate and denies myalgias. Her cholesterol is at goal. The cholesterol last visit was:   Lab Results  Component Value Date   CHOL 180 04/17/2021   HDL 37 (L) 04/17/2021   LDLCALC 92 04/17/2021   TRIG 383 (H) 04/17/2021   CHOLHDL 4.9 04/17/2021    She has been working on diet and exercise for prediabetes, and denies {Symptoms; diabetes w/o none:19199}. Last A1C in the office was:  Lab Results  Component Value Date   HGBA1C 6.0 (H) 04/17/2021   Patient is on Vitamin D supplement.   Lab Results  Component Value Date   VD25OH 36 04/17/2021        Current Medications:  Current Outpatient Medications on File Prior to Visit  Medication Sig   acetaminophen (TYLENOL) 650 MG CR tablet Take 650 mg by mouth every 8 (eight) hours as needed for pain.   albuterol (VENTOLIN HFA) 108 (90 Base) MCG/ACT inhaler Inhale 1-2 puffs into the lungs every 4 (four) hours as needed for wheezing or shortness of breath. Please give generic or the one that insurance covers   ARIPiprazole (ABILIFY) 5 MG tablet Take 1 tablet (5 mg total) by mouth daily.  benzonatate (TESSALON) 200 MG capsule Take 1 tab three times a day as needed for cough.   buPROPion (WELLBUTRIN XL) 300 MG 24 hr tablet Take  1 tablet  Daily for Mood   Celecoxib (CELEBREX PO) Take by mouth as needed.   cetirizine (ZYRTEC) 10 MG tablet Take 10 mg by mouth daily.   cholecalciferol (VITAMIN D3) 25 MCG (1000 UNIT) tablet Take 4,000 Units by mouth daily. (Patient not taking: Reported  on 04/17/2021)   Coenzyme Q10 (CO Q 10 PO) Take by mouth.   COVID-19 mRNA bivalent vaccine, Pfizer, (PFIZER COVID-19 VAC BIVALENT) injection Inject into the muscle.   CYANOCOBALAMIN SL Place 1 tablet under the tongue daily. Unsure of dose, OTC (Patient not taking: Reported on 04/17/2021)   dexamethasone (DECADRON) 4 MG tablet Take 1 tab 3 x day - 3 days, then 2 x day - 3 days, then 1 tab daily   doxycycline (VIBRAMYCIN) 100 MG capsule Take 1 capsule 2 x/day with food for 10 days.   ezetimibe (ZETIA) 10 MG tablet Take  1 tablet  Daily  for Cholesterol   famotidine (PEPCID) 20 MG tablet Take  1 tablet  2 x/day  with Meals  to Prevent Indigestion & Heartburn   fenofibrate (TRICOR) 145 MG tablet Take  1 tablet  Daily  for Triglycerides (Blood Fats)   fluticasone (FLONASE) 50 MCG/ACT nasal spray Place 1-2 sprays into both nostrils at bedtime. (Patient not taking: Reported on 04/17/2021)   gabapentin (NEURONTIN) 300 MG capsule Take 1 capsule 1 hour  before Bedtime  as needed for Restless Legs & Sleep   lamoTRIgine (LAMICTAL) 100 MG tablet Take  1 tablet  Daily  to Stabilize Mood   Omega-3 1000 MG CAPS Take by mouth.   omeprazole (PRILOSEC) 20 MG capsule Take  1 capsule  2 x /day  to Prevent Indigestion & Heartburn   ondansetron (ZOFRAN-ODT) 8 MG disintegrating tablet DISSOLVE 1 TABLET ON THE  TONGUE EVERY 8 HOURS AS  NEEDED FOR NAUSEA AND  VOMITING   SUCRALFATE PO Take by mouth as needed.   SUMAtriptan (IMITREX) 25 MG tablet TAKE 1 TO 2 TABLETS BY  MOUTH AT ONSET OF MIGRAINE. MAY REPEAT IN 2 HOURS IF  HEADACHE PERSISTS. MAX OF 4 TABLETS PER 24 HOURS   No current facility-administered medications on file prior to visit.     Allergies:  Allergies  Allergen Reactions   Erythromycin Other (See Comments)    TBS   Iodinated Diagnostic Agents Other (See Comments)    Hot flashes, itching scalp Hot flashes, itching scalp   Penicillins Hives   Clarithromycin Other (See Comments)    Itching ankles    Brintellix [Vortioxetine]     Itching     Medical History:  Past Medical History:  Diagnosis Date   Adnexal mass 04/08/2020   Anemia    Depression    Extrinsic asthma 11/09/2014   Gait abnormality 12/26/2019   GERD (gastroesophageal reflux disease)    Hay fever    Head trauma    IBS (irritable bowel syndrome)    Memory loss    Migraines    OSA on CPAP    Reflux    RLS (restless legs syndrome)    Sinus congestion    Family history- Reviewed and unchanged Social history- Reviewed and unchanged   Review of Systems:  ROS    Physical Exam: There were no vitals taken for this visit. Wt Readings from Last 3 Encounters:  04/17/21 187 lb 6.4  oz (85 kg)  02/12/21 181 lb 3.2 oz (82.2 kg)  11/29/20 188 lb (85.3 kg)   General Appearance: Well nourished, in no apparent distress. Eyes: PERRLA, EOMs, conjunctiva no swelling or erythema Sinuses: No Frontal/maxillary tenderness ENT/Mouth: Ext aud canals clear, TMs without erythema, bulging. No erythema, swelling, or exudate on post pharynx.  Tonsils not swollen or erythematous. Hearing normal.  Neck: Supple, thyroid normal.  Respiratory: Respiratory effort normal, BS equal bilaterally without rales, rhonchi, wheezing or stridor.  Cardio: RRR with no MRGs. Brisk peripheral pulses without edema.  Abdomen: Soft, + BS.  Non tender, no guarding, rebound, hernias, masses. Lymphatics: Non tender without lymphadenopathy.  Musculoskeletal: Full ROM, 5/5 strength, {PSY - GAIT AND STATION:22860} gait Skin: Warm, dry without rashes, lesions, ecchymosis.  Neuro: Cranial nerves intact. No cerebellar symptoms.  Psych: Awake and oriented X 3, normal affect, Insight and Judgment appropriate.    Magda Bernheim, NP 11:05 AM [facility]

## 2021-07-30 ENCOUNTER — Ambulatory Visit: Payer: Medicare Other | Admitting: Nurse Practitioner

## 2021-07-30 DIAGNOSIS — E663 Overweight: Secondary | ICD-10-CM

## 2021-07-30 DIAGNOSIS — E782 Mixed hyperlipidemia: Secondary | ICD-10-CM

## 2021-07-30 DIAGNOSIS — R030 Elevated blood-pressure reading, without diagnosis of hypertension: Secondary | ICD-10-CM

## 2021-07-30 DIAGNOSIS — E559 Vitamin D deficiency, unspecified: Secondary | ICD-10-CM

## 2021-07-30 DIAGNOSIS — Z79899 Other long term (current) drug therapy: Secondary | ICD-10-CM

## 2021-07-30 DIAGNOSIS — N1831 Chronic kidney disease, stage 3a: Secondary | ICD-10-CM

## 2021-07-30 DIAGNOSIS — R7309 Other abnormal glucose: Secondary | ICD-10-CM

## 2021-07-30 DIAGNOSIS — F339 Major depressive disorder, recurrent, unspecified: Secondary | ICD-10-CM

## 2021-08-01 DIAGNOSIS — M1711 Unilateral primary osteoarthritis, right knee: Secondary | ICD-10-CM | POA: Diagnosis not present

## 2021-08-01 DIAGNOSIS — S83281A Other tear of lateral meniscus, current injury, right knee, initial encounter: Secondary | ICD-10-CM | POA: Diagnosis not present

## 2021-08-06 NOTE — Progress Notes (Signed)
FOLLOW UP  Assessment and Plan:   Elevated BP without diagnosis of hypertension Monitor blood pressure at home; patient to call if consistently greater than 130/80 Continue DASH diet.   Reminder to go to the ER if any CP, SOB, nausea, dizziness, severe HA, changes vision/speech, left arm numbness and tingling and jaw pain.  Mixed Hyperlipidemia Currently at goal;  Continue low cholesterol diet and exercise.  Check lipid panel.   Abnormal Glucose Continue medication: Continue diet and exercise.  Perform daily foot/skin check, notify office of any concerning changes.  Check A1C  Stage 3 CKD Kidney functions are decreased , continue to push fluids throughout the day and limit Ibuprofen and aleve as they are processed through the kidneys   Overweight Long discussion about weight loss, diet, and exercise Recommended diet heavy in fruits and veggies and low in animal meats, cheeses, and dairy products, appropriate calorie intake Will follow up in 3 months  Vitamin D Def At goal at last visit; continue supplementation to maintain goal of 60-100 Defer Vit D level  Anemia CBC Iron total and TIBC  Palpitations EKG WNL Continue to monitor, if continue or worsen call and will refer to cardiology  Vitamin B12 deficiency Vitamin B12 level checked today  Depression Currently on Abilify 5 mg , Lamictal 100 mg daily, Wellbutrin 300mg  daily Symptoms seem to be worse but were initially controlled well  Cystocele with first degree uterine prolapse Continue Kegels Premarin vaginal cream nightly x 14 days, every other night x 14 days and 2 night a week as maintenance  Costochondritis Prednisone dose pack to decrease inflammation, unable to use NSAIDs due to kidney functions Warm moist heat.  Rest If not improving call the office   Continue diet and meds as discussed. Further disposition pending results of labs. Discussed med's effects and SE's.   Over 60 minutes of exam,  counseling, chart review, and critical decision making was performed.   Future Appointments  Date Time Provider [place]  08/15/2021  9:15 AM [name], NP [facility] None  11/29/2021 11:00 AM Liane Comber, NP [facility] None  04/18/2022 10:00 AM Liane Comber, NP [facility] None    ----------------------------------------------------------------------------------------------------------------------  HPI 70 y.o. female  presents for 3 month follow up on hypertension, cholesterol, diabetes, weight and vitamin D deficiency.   BMI is Body mass index is 29.89 kg/m., she has not been working on diet and exercise. She is eating more carbohydrates and fats, has not been exercising.  Wt Readings from Last 3 Encounters:  08/08/21 188 lb (85.3 kg)  04/17/21 187 lb 6.4 oz (85 kg)  02/12/21 181 lb 3.2 oz (82.2 kg)   She is experiencing intermittent urinary urgency, gets up 2-3 times a night to urinate. Occ feels like she is sitting on a ball. Will occasionally have urge incontinence.  Denies stress incontinence. Limiting fluid intake before bed and Kegels have helped the symptoms.  She has been noticing palpitations which she describes as a skipping of a beat, happening more frequently in the past month.  Denies chest pain, shortness of breath, blurred vision, dizziness.   Also has noted a pain in left side of chest for the past several days. Painful when touched. She was recently prescribed dexamethasone by Dr. Melford Aase but did not take them.  Her blood pressure has been controlled at home, today their BP is BP: 122/82 BP Readings from Last 3 Encounters:  08/08/21 122/82  04/17/21 132/80  02/12/21 124/82     She does  not workout. She denies shortness of breath, dizziness.   She is on cholesterol medication Zetia and Fenofibrate and denies myalgias. Her cholesterol is at goal. The cholesterol last visit was:   Lab Results  Component Value Date   CHOL 180 04/17/2021   HDL 37 (L)  04/17/2021   LDLCALC 92 04/17/2021   TRIG 383 (H) 04/17/2021   CHOLHDL 4.9 04/17/2021    She has been working on diet and exercise for abnormal glucose . Last A1C in the office was:  Lab Results  Component Value Date   HGBA1C 6.0 (H) 04/17/2021   Patient is on Vitamin D supplement.   Lab Results  Component Value Date   VD25OH 36 04/17/2021        Current Medications:  Current Outpatient Medications on File Prior to Visit  Medication Sig   acetaminophen (TYLENOL) 650 MG CR tablet Take 650 mg by mouth every 8 (eight) hours as needed for pain.   albuterol (VENTOLIN HFA) 108 (90 Base) MCG/ACT inhaler Inhale 1-2 puffs into the lungs every 4 (four) hours as needed for wheezing or shortness of breath. Please give generic or the one that insurance covers   ARIPiprazole (ABILIFY) 5 MG tablet Take 1 tablet (5 mg total) by mouth daily.   benzonatate (TESSALON) 200 MG capsule Take 1 tab three times a day as needed for cough.   buPROPion (WELLBUTRIN XL) 300 MG 24 hr tablet Take  1 tablet  Daily for Mood   Celecoxib (CELEBREX PO) Take by mouth as needed.   cetirizine (ZYRTEC) 10 MG tablet Take 10 mg by mouth daily.   cholecalciferol (VITAMIN D3) 25 MCG (1000 UNIT) tablet Take 4,000 Units by mouth daily.   Coenzyme Q10 (CO Q 10 PO) Take by mouth.   ezetimibe (ZETIA) 10 MG tablet Take  1 tablet  Daily  for Cholesterol   famotidine (PEPCID) 20 MG tablet Take  1 tablet  2 x/day  with Meals  to Prevent Indigestion & Heartburn   fenofibrate (TRICOR) 145 MG tablet Take  1 tablet  Daily  for Triglycerides (Blood Fats)   lamoTRIgine (LAMICTAL) 100 MG tablet Take  1 tablet  Daily  to Stabilize Mood   Magnesium 250 MG TABS Take by mouth. Takes PRN   omeprazole (PRILOSEC) 20 MG capsule Take  1 capsule  2 x /day  to Prevent Indigestion & Heartburn   ondansetron (ZOFRAN-ODT) 8 MG disintegrating tablet DISSOLVE 1 TABLET ON THE  TONGUE EVERY 8 HOURS AS  NEEDED FOR NAUSEA AND  VOMITING   SUCRALFATE PO Take by  mouth as needed.   SUMAtriptan (IMITREX) 25 MG tablet TAKE 1 TO 2 TABLETS BY  MOUTH AT ONSET OF MIGRAINE. MAY REPEAT IN 2 HOURS IF  HEADACHE PERSISTS. MAX OF 4 TABLETS PER 24 HOURS   COVID-19 mRNA bivalent vaccine, Pfizer, (PFIZER COVID-19 VAC BIVALENT) injection Inject into the muscle.   CYANOCOBALAMIN SL Place 1 tablet under the tongue daily. Unsure of dose, OTC (Patient not taking: Reported on 04/17/2021)   dexamethasone (DECADRON) 4 MG tablet Take 1 tab 3 x day - 3 days, then 2 x day - 3 days, then 1 tab daily (Patient not taking: Reported on 08/08/2021)   doxycycline (VIBRAMYCIN) 100 MG capsule Take 1 capsule 2 x/day with food for 10 days. (Patient not taking: Reported on 08/08/2021)   fluticasone (FLONASE) 50 MCG/ACT nasal spray Place 1-2 sprays into both nostrils at bedtime. (Patient not taking: Reported on 04/17/2021)   gabapentin (NEURONTIN) 300  MG capsule Take 1 capsule 1 hour  before Bedtime  as needed for Restless Legs & Sleep   Omega-3 1000 MG CAPS Take by mouth. (Patient not taking: Reported on 08/08/2021)   No current facility-administered medications on file prior to visit.     Allergies:  Allergies  Allergen Reactions   Erythromycin Other (See Comments)    TBS   Iodinated Diagnostic Agents Other (See Comments)    Hot flashes, itching scalp Hot flashes, itching scalp   Penicillins Hives   Clarithromycin Other (See Comments)    Itching ankles   Brintellix [Vortioxetine]     Itching     Medical History:  Past Medical History:  Diagnosis Date   Adnexal mass 04/08/2020   Anemia    Depression    Extrinsic asthma 11/09/2014   Gait abnormality 12/26/2019   GERD (gastroesophageal reflux disease)    Hay fever    Head trauma    IBS (irritable bowel syndrome)    Memory loss    Migraines    OSA on CPAP    Reflux    RLS (restless legs syndrome)    Sinus congestion    Family history- Reviewed and unchanged Social history- Reviewed and unchanged   Review of Systems:   Review of Systems  Constitutional:  Positive for malaise/fatigue. Negative for chills, fever and weight loss.  HENT:  Negative for congestion and hearing loss.   Eyes:  Negative for blurred vision and double vision.  Respiratory:  Negative for cough and shortness of breath.   Cardiovascular:  Positive for palpitations. Negative for chest pain, orthopnea and leg swelling.  Gastrointestinal:  Negative for abdominal pain, constipation, diarrhea, heartburn, nausea and vomiting.  Genitourinary:  Positive for frequency and urgency.  Musculoskeletal:  Positive for joint pain. Negative for falls and myalgias.       Pain in left side of chest with pressure/palpation  Skin:  Negative for rash.  Neurological:  Negative for dizziness, tingling, tremors, loss of consciousness and headaches.  Psychiatric/Behavioral:  Positive for depression. Negative for memory loss and suicidal ideas.      Physical Exam: BP 122/82    Pulse 77    Temp (!) 97.5 F (36.4 C)    Wt 188 lb (85.3 kg)    SpO2 97%    BMI 29.89 kg/m  Wt Readings from Last 3 Encounters:  08/08/21 188 lb (85.3 kg)  04/17/21 187 lb 6.4 oz (85 kg)  02/12/21 181 lb 3.2 oz (82.2 kg)   General Appearance: Well nourished, in no apparent distress. Eyes: PERRLA, EOMs, conjunctiva no swelling or erythema Sinuses: No Frontal/maxillary tenderness ENT/Mouth: Ext aud canals clear, TMs without erythema, bulging. No erythema, swelling, or exudate on post pharynx.  Tonsils not swollen or erythematous. Hearing normal.  Neck: Supple, thyroid normal.  Respiratory: Respiratory effort normal, BS equal bilaterally without rales, rhonchi, wheezing or stridor.  Cardio: RRR with no MRGs. Brisk peripheral pulses without edema.  Abdomen: Soft, + BS.  Non tender, no guarding, rebound, hernias, masses. Lymphatics: Non tender without lymphadenopathy.  Musculoskeletal: Full ROM, 5/5 strength, Normal gait. Tenderness when palpating left chest in rib area Skin: Warm,  dry without rashes, lesions, ecchymosis.  Neuro: Cranial nerves intact. No cerebellar symptoms.  Psych: Awake and oriented X 3, normal affect, Insight and Judgment appropriate.  Pelvic exam: normal external genitalia, vulva PELVIC FLOOR EXAM: cystocele 1st degree.   EKG: NSR   Tytianna Greenley Kathyrn Drown, NP 2:24 PM [facility]

## 2021-08-08 ENCOUNTER — Encounter: Payer: Self-pay | Admitting: Nurse Practitioner

## 2021-08-08 ENCOUNTER — Other Ambulatory Visit: Payer: Self-pay

## 2021-08-08 ENCOUNTER — Ambulatory Visit (INDEPENDENT_AMBULATORY_CARE_PROVIDER_SITE_OTHER): Payer: Medicare Other | Admitting: Nurse Practitioner

## 2021-08-08 VITALS — BP 122/82 | HR 77 | Temp 97.5°F | Wt 188.0 lb

## 2021-08-08 DIAGNOSIS — E782 Mixed hyperlipidemia: Secondary | ICD-10-CM

## 2021-08-08 DIAGNOSIS — D649 Anemia, unspecified: Secondary | ICD-10-CM | POA: Diagnosis not present

## 2021-08-08 DIAGNOSIS — R002 Palpitations: Secondary | ICD-10-CM | POA: Diagnosis not present

## 2021-08-08 DIAGNOSIS — R7309 Other abnormal glucose: Secondary | ICD-10-CM

## 2021-08-08 DIAGNOSIS — E538 Deficiency of other specified B group vitamins: Secondary | ICD-10-CM

## 2021-08-08 DIAGNOSIS — R030 Elevated blood-pressure reading, without diagnosis of hypertension: Secondary | ICD-10-CM

## 2021-08-08 DIAGNOSIS — N1831 Chronic kidney disease, stage 3a: Secondary | ICD-10-CM

## 2021-08-08 DIAGNOSIS — F339 Major depressive disorder, recurrent, unspecified: Secondary | ICD-10-CM

## 2021-08-08 DIAGNOSIS — M940 Chondrocostal junction syndrome [Tietze]: Secondary | ICD-10-CM

## 2021-08-08 DIAGNOSIS — E559 Vitamin D deficiency, unspecified: Secondary | ICD-10-CM | POA: Diagnosis not present

## 2021-08-08 DIAGNOSIS — N812 Incomplete uterovaginal prolapse: Secondary | ICD-10-CM

## 2021-08-08 DIAGNOSIS — E663 Overweight: Secondary | ICD-10-CM

## 2021-08-08 MED ORDER — PREDNISONE 20 MG PO TABS: ORAL_TABLET | ORAL | 0 refills | Status: AC

## 2021-08-08 MED ORDER — ARIPIPRAZOLE 10 MG PO TABS: 10.0000 mg | ORAL_TABLET | Freq: Every day | ORAL | 3 refills | Status: DC

## 2021-08-08 MED ORDER — ESTRADIOL 0.1 MG/GM VA CREA: 1.0000 | TOPICAL_CREAM | Freq: Every day | VAGINAL | Status: DC

## 2021-08-08 MED ORDER — ESTROGENS CONJUGATED 0.625 MG/GM VA CREA: TOPICAL_CREAM | VAGINAL | 12 refills | Status: DC

## 2021-08-08 NOTE — Patient Instructions (Signed)
Costochondritis  Costochondritis is inflammation of the tissue (cartilage) that connects the ribs to the breastbone (sternum). This causes pain in the front of the chest. The pain usually starts slowlyand involves more than one rib. What are the causes? The exact cause of this condition is not always known. It results from stress on the cartilage where your ribs attach to your sternum. The cause of this stress could be: Chest injury. Exercise or activity, such as lifting. Severe coughing. What increases the risk? You are more likely to develop this condition if you: Are female. Are 30-40 years old. Recently started a new exercise or work activity. Have low levels of vitamin D. Have a condition that makes you cough frequently. What are the signs or symptoms? The main symptom of this condition is chest pain. The pain: Usually starts gradually and can be sharp or dull. Gets worse with deep breathing, coughing, or exercise. Gets better with rest. May be worse when you press on the affected area of your ribs and sternum. How is this diagnosed? This condition is diagnosed based on your symptoms, your medical history, and a physical exam. Your health care provider will check for pain when pressing on your sternum. You may also have tests to rule out other causes of chest pain. These may include: A chest X-ray to check for lung problems. An ECG (electrocardiogram) to see if you have a heart problem that could be causing the pain. An imaging scan to rule out a chest or rib fracture. How is this treated? This condition usually goes away on its own over time. Your health care provider may prescribe an NSAID, such as ibuprofen, to reduce pain and inflammation. Treatment may also include: Resting and avoiding activities that make pain worse. Applying heat or ice to the area to reduce pain and inflammation. Doing exercises to stretch your chest muscles. If these treatments do not help, your health  care provider may inject a numbingmedicine at the sternum-rib connection to help relieve the pain. Follow these instructions at home: Managing pain, stiffness, and swelling     If directed, put ice on the painful area. To do this: Put ice in a plastic bag. Place a towel between your skin and the bag. Leave the ice on for 20 minutes, 2-3 times a day. If directed, apply heat to the affected area as often as told by your health care provider. Use the heat source that your health care provider recommends, such as a moist heat pack or a heating pad. Place a towel between your skin and the heat source. Leave the heat on for 20-30 minutes. Remove the heat if your skin turns bright red. This is especially important if you are unable to feel pain, heat, or cold. You may have a greater risk of getting burned. Activity Rest as told by your health care provider. Avoid activities that make pain worse. This includes any activities that use chest, abdominal, and side muscles. Do not lift anything that is heavier than 10 lb (4.5 kg), or the limit that you are told, until your health care provider says that it is safe. Return to your normal activities as told by your health care provider. Ask your health care provider what activities are safe for you. General instructions Take over-the-counter and prescription medicines only as told by your health care provider. Keep all follow-up visits as told by your health care provider. This is important. Contact a health care provider if: You have chills or a   fever. Your pain does not go away or it gets worse. You have a cough that does not go away. Get help right away if: You have shortness of breath. You have severe chest pain that is not relieved by medicines, heat, or ice. These symptoms may represent a serious problem that is an emergency. Do not wait to see if the symptoms will go away. Get medical help right away. Call your local emergency services (911 in  the [place] Do not drive yourself to the hospital. Summary Costochondritis is inflammation of the tissue (cartilage) that connects the ribs to the breastbone (sternum). This condition causes pain in the front of the chest. Costochondritis results from stress on the cartilage where your ribs attach to your sternum. Treatment may include medicines, rest, heat or ice, and exercises. This information is not intended to replace advice given to you by your health care provider. Make sure you discuss any questions you have with your healthcare provider. Document Revised: 06/17/2019 Document Reviewed: 06/17/2019 Elsevier Patient Education  2022 [employer]  

## 2021-08-08 NOTE — Addendum Note (Signed)
Addended by: Magda Bernheim on: 08/08/2021 03:49 PM   Modules accepted: Orders

## 2021-08-09 LAB — COMPLETE METABOLIC PANEL WITH GFR
AG Ratio: 2.1 (calc) (ref 1.0–2.5)
ALT: 23 U/L (ref 6–29)
AST: 31 U/L (ref 10–35)
Albumin: 4.7 g/dL (ref 3.6–5.1)
Alkaline phosphatase (APISO): 68 U/L (ref 37–153)
BUN/Creatinine Ratio: 18 (calc) (ref 6–22)
BUN: 22 mg/dL (ref 7–25)
CO2: 25 mmol/L (ref 20–32)
Calcium: 11 mg/dL — ABNORMAL HIGH (ref 8.6–10.4)
Chloride: 105 mmol/L (ref 98–110)
Creat: 1.2 mg/dL — ABNORMAL HIGH (ref 0.60–1.00)
Globulin: 2.2 g/dL (calc) (ref 1.9–3.7)
Glucose, Bld: 143 mg/dL — ABNORMAL HIGH (ref 65–99)
Potassium: 4.8 mmol/L (ref 3.5–5.3)
Sodium: 139 mmol/L (ref 135–146)
Total Bilirubin: 0.4 mg/dL (ref 0.2–1.2)
Total Protein: 6.9 g/dL (ref 6.1–8.1)
eGFR: 48 mL/min/{1.73_m2} — ABNORMAL LOW (ref >=60)

## 2021-08-09 LAB — CBC WITH DIFFERENTIAL/PLATELET
Absolute Monocytes: 663 cells/uL (ref 200–950)
Basophils Absolute: 52 cells/uL (ref 0–200)
Basophils Relative: 0.8 %
Eosinophils Absolute: 78 cells/uL (ref 15–500)
Eosinophils Relative: 1.2 %
HCT: 39.6 % (ref 35.0–45.0)
Hemoglobin: 12.9 g/dL (ref 11.7–15.5)
Lymphs Abs: 1716 cells/uL (ref 850–3900)
MCH: 28.2 pg (ref 27.0–33.0)
MCHC: 32.6 g/dL (ref 32.0–36.0)
MCV: 86.5 fL (ref 80.0–100.0)
MPV: 9.9 fL (ref 7.5–12.5)
Monocytes Relative: 10.2 %
Neutro Abs: 3991 cells/uL (ref 1500–7800)
Neutrophils Relative %: 61.4 %
Platelets: 320 10*3/uL (ref 140–400)
RBC: 4.58 10*6/uL (ref 3.80–5.10)
RDW: 12.2 % (ref 11.0–15.0)
Total Lymphocyte: 26.4 %
WBC: 6.5 10*3/uL (ref 3.8–10.8)

## 2021-08-09 LAB — LIPID PANEL
Cholesterol: 167 mg/dL (ref <200)
HDL: 44 mg/dL — ABNORMAL LOW (ref >=50)
LDL Cholesterol (Calc): 80 mg/dL (calc)
Non-HDL Cholesterol (Calc): 123 mg/dL (calc) (ref <130)
Total CHOL/HDL Ratio: 3.8 (calc) (ref <5.0)
Triglycerides: 361 mg/dL — ABNORMAL HIGH (ref <150)

## 2021-08-09 LAB — VITAMIN D 25 HYDROXY (VIT D DEFICIENCY, FRACTURES): Vit D, 25-Hydroxy: 55 ng/mL (ref 30–100)

## 2021-08-09 LAB — FERRITIN: Ferritin: 37 ng/mL (ref 16–288)

## 2021-08-09 LAB — IRON, TOTAL/TOTAL IRON BINDING CAP
%SAT: 14 % (calc) — ABNORMAL LOW (ref 16–45)
Iron: 73 ug/dL (ref 45–160)
TIBC: 525 mcg/dL (calc) — ABNORMAL HIGH (ref 250–450)

## 2021-08-09 LAB — VITAMIN B12: Vitamin B-12: 772 pg/mL (ref 200–1100)

## 2021-08-09 LAB — TSH: TSH: 3.32 mIU/L (ref 0.40–4.50)

## 2021-08-12 ENCOUNTER — Encounter: Payer: Self-pay | Admitting: Nurse Practitioner

## 2021-08-14 NOTE — Addendum Note (Signed)
Addended by: Magda Bernheim on: 08/14/2021 08:41 AM   Modules accepted: Orders

## 2021-08-14 NOTE — Progress Notes (Addendum)
PATIENT: Kimberly Ray DOB: 14-Sep-1950  REASON FOR VISIT: follow up HISTORY FROM: patient Primary Neurologist: Dr. Brett Fairy since Dr. Jannifer Franklin has retired   HISTORY OF PRESENT ILLNESS: Today 08/15/21 Kimberly Ray is a 70 year old female with multiple complaints here with follow-up:  CPAP: Doing well, has 2 machines, she uses 1 at home, 1 when she goes to her mothers. Has 2 machines to review data from; overall she has about 90% compliance, AHI is 0.6, no significant leak from the mask. Machine is working well.  Can't imagine how she'd feel without it. ESS 18 (mood disorder contributing to responses).  Depression: Hasn't gone to psychiatry, looking for recommendation. [facility], didn't feel comfortable, so no medications changed. PCP started Abilify, dose recently increased, depression improving some, less sleepy. Today is a good day. Slept 8 hours with CPAP on.   RLS: Off Requip, just increased Abilify, few jumps in the legs, still on gabapentin. No more hallucinations.   Others: SOB with exertion, related to wearing mask? Palpations, heart fluttering, almost daily. Started PT for torn right knee mensicus. Joined a gym, but hasn't been going lately. MMSE 27/30 today.   Update 10/25/2020 SS: Memory: She had neuropsychological evaluation with Dr. Nicole Kindred, felt the issue was more related to executive control function, could be due to depression, aging, attention deficit disorder.  Felt neurodegeneration is less likely, can follow-up in 1 to 2 years. MRI of brain has been essentially normal for age.   MMSE 29/30 today. Is emotional today.  Depression: Recommended to see therapist from Dr. Nicole Kindred. [facility]. Has been taken off Cymbalta; now just on Lamictal and Wellbutrin. Is going through grief, family stress.  RLS: She stopped Requip, switched to gabapentin for RLS. Transition was rough, afterwards, zero restless leg  symptoms. On gabapentin 300 mg qhs.  CPAP: uses faithfully every night, needs more supplies, uses machine with chip   Gait, balance: much better off requip, looking at joining gym, not falling  Hallucinations: better since of Requip  HISTORY 12/26/2019 Dr. Jannifer Franklin: Kimberly Ray is a 70 year old right-handed white female with a history of diabetes and restless leg syndrome.  Restless leg syndrome runs in her family, her father and her brother have/had similar issues.  Over the last 5 or 6 years, she has noted some mild changes in memory.  She has had occasional events which she cannot figure out how things work, she may have some difficulty with remembering recent events or remembering names for people.  She over the last year has had occasional events where she may smell of tobacco smoke when there is none.  She has had events where she may see a vague object in her right peripheral vision but when she looks she sees nothing.  She sometimes thinks it is a mouse or a person, but she never actually sees the animal or person, she just has a sensation that that is what it is.  The patient is on Requip taking 5 mg at night, she has not had any recent increase in her dosing.  She has been placed on Seroquel.  The patient has also noted some change in balance, she will stumble on occasion, she fell last summer and fractured the left ankle.  She denies any numbness in her feet associated with her diabetes.  She has occasional headaches, she denies significant issues controlling the bowels or the bladder, she occasionally will have stress incontinence  of the bladder.  She claims that her father also had memory problems before he died.  Occasionally when she stands up she may have a bit of a tremor involving the legs, occasionally a jaw tremor or tremor involving the left shoulder.  Currently, the patient is able to keep up with her medications and appointments but she is now using a pill dispenser.  She is able to  operate a motor vehicle, she denies any issues getting lost.  She is sent to this office for further evaluation.  REVIEW OF SYSTEMS: Out of a complete 14 system review of symptoms, the patient complains only of the following symptoms, and all other reviewed systems are negative.  See HPI  ALLERGIES: Allergies  Allergen Reactions   Erythromycin Other (See Comments)    TBS   Iodinated Contrast Media Other (See Comments)    Hot flashes, itching scalp Hot flashes, itching scalp   Penicillins Hives   Clarithromycin Other (See Comments)    Itching ankles   Brintellix [Vortioxetine]     Itching    HOME MEDICATIONS: Outpatient Medications Prior to Visit  Medication Sig Dispense Refill   acetaminophen (TYLENOL) 650 MG CR tablet Take 650 mg by mouth every 8 (eight) hours as needed for pain.     albuterol (VENTOLIN HFA) 108 (90 Base) MCG/ACT inhaler Inhale 1-2 puffs into the lungs every 4 (four) hours as needed for wheezing or shortness of breath. Please give generic or the one that insurance covers 1 each 0   ARIPiprazole (ABILIFY) 10 MG tablet Take 1 tablet (10 mg total) by mouth daily. 30 tablet 3   benzonatate (TESSALON) 200 MG capsule Take 1 tab three times a day as needed for cough. 90 capsule 1   buPROPion (WELLBUTRIN XL) 300 MG 24 hr tablet Take  1 tablet  Daily for Mood 90 tablet 3   Celecoxib (CELEBREX PO) Take by mouth as needed.     cetirizine (ZYRTEC) 10 MG tablet Take 10 mg by mouth daily.     cholecalciferol (VITAMIN D3) 25 MCG (1000 UNIT) tablet Take 4,000 Units by mouth daily.     Coenzyme Q10 (CO Q 10 PO) Take by mouth.     COVID-19 mRNA bivalent vaccine, Pfizer, (PFIZER COVID-19 VAC BIVALENT) injection Inject into the muscle. 0.3 mL 0   CYANOCOBALAMIN SL Place 1 tablet under the tongue daily. Unsure of dose, OTC (Patient not taking: Reported on 04/17/2021)     doxycycline (VIBRAMYCIN) 100 MG capsule Take 1 capsule 2 x/day with food for 10 days. (Patient not taking: Reported  on 08/08/2021) 20 capsule 0   ezetimibe (ZETIA) 10 MG tablet Take  1 tablet  Daily  for Cholesterol 90 tablet 3   famotidine (PEPCID) 20 MG tablet Take  1 tablet  2 x/day  with Meals  to Prevent Indigestion & Heartburn 180 tablet 3   fenofibrate (TRICOR) 145 MG tablet Take  1 tablet  Daily  for Triglycerides (Blood Fats) 90 tablet 3   fluticasone (FLONASE) 50 MCG/ACT nasal spray Place 1-2 sprays into both nostrils at bedtime. (Patient not taking: Reported on 04/17/2021) 16 g 1   gabapentin (NEURONTIN) 300 MG capsule Take 1 capsule 1 hour  before Bedtime  as needed for Restless Legs & Sleep 90 capsule 3   lamoTRIgine (LAMICTAL) 100 MG tablet Take  1 tablet  Daily  to Stabilize Mood 90 tablet 3   Magnesium 250 MG TABS Take by mouth. Takes PRN     Omega-3  1000 MG CAPS Take by mouth. (Patient not taking: Reported on 08/08/2021)     omeprazole (PRILOSEC) 20 MG capsule Take  1 capsule  2 x /day  to Prevent Indigestion & Heartburn 180 capsule 3   ondansetron (ZOFRAN-ODT) 8 MG disintegrating tablet DISSOLVE 1 TABLET ON THE  TONGUE EVERY 8 HOURS AS  NEEDED FOR NAUSEA AND  VOMITING 60 tablet 0   predniSONE (DELTASONE) 20 MG tablet 3 tablets daily with food for 3 days, 2 tabs daily for 3 days, 1 tab a day for 5 days. 20 tablet 0   SUCRALFATE PO Take by mouth as needed.     SUMAtriptan (IMITREX) 25 MG tablet TAKE 1 TO 2 TABLETS BY  MOUTH AT ONSET OF MIGRAINE. MAY REPEAT IN 2 HOURS IF  HEADACHE PERSISTS. MAX OF 4 TABLETS PER 24 HOURS 30 tablet 0   Facility-Administered Medications Prior to Visit  Medication Dose Route Frequency Provider Last Rate Last Admin   estradiol (ESTRACE) vaginal cream 1 Applicatorful  1 Applicatorful Vaginal QHS Magda Bernheim, NP        PAST MEDICAL HISTORY: Past Medical History:  Diagnosis Date   Adnexal mass 04/08/2020   Anemia    Depression    Extrinsic asthma 11/09/2014   Gait abnormality 12/26/2019   GERD (gastroesophageal reflux disease)    Hay fever    Head trauma    IBS  (irritable bowel syndrome)    Memory loss    Migraines    OSA on CPAP    Reflux    RLS (restless legs syndrome)    Sinus congestion     PAST SURGICAL HISTORY: Past Surgical History:  Procedure Laterality Date   ABDOMINAL HYSTERECTOMY     BREAST BIOPSY Right 02/27/2014   Stereo- Benign   CHOLECYSTECTOMY  11/03/2017   Dr. Rosendo Gros   LAPAROSCOPIC HYSTERECTOMY  12/2017   NASAL SINUS SURGERY     ORIF ANKLE FRACTURE BIMALLEOLAR Left 03/14/2019   [facility], Dr. Sheran Lawless   perforated eardrum     left side   TONSILLECTOMY AND ADENOIDECTOMY     URETEROSCOPY     for ureteral stone    FAMILY HISTORY: Family History  Problem Relation Age of Onset   Diabetes Mother    Heart failure Father    Dementia Father    Mood Disorder Father    Anxiety disorder Father    Diabetes Sister    Anxiety disorder Sister    Depression Sister    Breast cancer Maternal Grandmother 16   Breast cancer Maternal Aunt 45   Breast cancer Cousin    Osteoporosis Brother    Bipolar disorder Brother    Anxiety disorder Brother    Heart failure Maternal Grandfather 48   Colon cancer Paternal Grandmother 67   Mental illness Paternal Grandmother    Stomach cancer Maternal Aunt     SOCIAL HISTORY: Social History   Socioeconomic History   Marital status: Divorced    Spouse name: Not on file   Number of children: 2   Years of education: 13   Highest education level: Not on file  Occupational History    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 Patient is flight attendant. Patient has college education one  year      Right handed.   Caffeine- Rare.    Social Determinants of Health   Financial Resource Strain: Not on file  Food Insecurity: Not on file  Transportation Needs: Not on file  Physical Activity: Not on file  Stress: Not on file  Social Connections: Not on file  Intimate Partner Violence: Not on file   PHYSICAL EXAM  Vitals:   08/15/21 0913  BP: 116/74  Pulse: 73  Weight: 188 lb (85.3 kg)  Height: 5\' 6"  (1.676 m)    Body mass index is 30.34 kg/m.  Generalized: Well developed, in no acute distress  MMSE - Mini Mental State Exam 08/15/2021 10/25/2020 06/27/2020  Orientation to time 5 5 5   Orientation to Place 5 5 5   Registration 3 3 3   Attention/ Calculation 2 4 4   Recall 3 3 2   Language- name 2 objects 2 2 2   Language- repeat 1 1 1   Language- follow 3 step command 3 3 3   Language- read & follow direction 1 1 1   Write a sentence 1 1 1   Copy design 1 1 1   Total score 27 29 28     Neurological examination  Mentation: Alert oriented to time, place, history taking. Follows all commands speech and language fluent. Smiling, engaged.  Cranial nerve II-XII: Pupils were equal round reactive to light. Extraocular movements were full, visual field were full on confrontational test. Facial sensation and strength were normal. Head turning and shoulder shrug  were normal and symmetric. Motor: The motor testing reveals 5 over 5 strength of all 4 extremities. Good symmetric motor tone is noted throughout.  No tremor. Sensory: Sensory testing is intact to soft touch on all 4 extremities. No evidence of extinction is noted.  Coordination: Cerebellar testing reveals good finger-nose-finger and heel-to-shin bilaterally.  Gait and station: Gait is normal, normal strides, tandem gait is unsteady. Reflexes: Deep tendon reflexes are symmetric and normal bilaterally.   DIAGNOSTIC DATA (LABS, IMAGING, TESTING) - I reviewed patient records, labs, notes, testing and imaging myself where  available.  Lab Results  Component Value Date   WBC 6.5 08/08/2021   HGB 12.9 08/08/2021   HCT 39.6 08/08/2021   MCV 86.5 08/08/2021   PLT 320 08/08/2021      Component Value Date/Time   NA 139 08/08/2021 1506   NA 141 12/26/2019 1227   K 4.8 08/08/2021 1506   CL 105 08/08/2021 1506   CO2 25 08/08/2021 1506   GLUCOSE 143 (H) 08/08/2021 1506   BUN 22 08/08/2021 1506   BUN 16 12/26/2019 1227   CREATININE 1.20 (H) 08/08/2021 1506   CALCIUM 11.0 (H) 08/08/2021 1506   PROT 6.9 08/08/2021 1506   PROT 6.8 12/26/2019 1227   ALBUMIN 4.8 12/26/2019 1227   AST 31 08/08/2021 1506   ALT 23 08/08/2021 1506   ALKPHOS 90 12/26/2019 1227   BILITOT 0.4 08/08/2021 1506   BILITOT 0.2 12/26/2019 1227   GFRNONAA 50 (L) 11/29/2020 1538   GFRAA 58 (L) 11/29/2020 1538   Lab Results  Component Value Date   CHOL 167 08/08/2021   HDL 44 (L) 08/08/2021   LDLCALC 80 08/08/2021   TRIG 361 (H) 08/08/2021   CHOLHDL 3.8 08/08/2021   Lab Results  Component Value Date   HGBA1C 6.0 (H) 04/17/2021   Lab Results  Component Value Date   BBCWUGQB16 945 08/08/2021   Lab Results  Component Value Date   TSH 3.32 08/08/2021   ASSESSMENT AND PLAN 70 y.o. year old female  has a past medical history of Adnexal mass (04/08/2020), Anemia, Depression, Extrinsic asthma (11/09/2014), Gait abnormality (12/26/2019), GERD (gastroesophageal reflux disease), Hay fever, Head trauma, IBS (irritable bowel syndrome), Memory loss, Migraines, OSA on CPAP, Reflux, RLS (restless legs syndrome), and Sinus congestion. here with:  1.  Mild memory disturbance, MMSE 27/30 today -Had neuropsychological evaluation with Dr. Nicole Kindred, felt the issue was more related to executive control function, could be due to depression, aging, attention deficit disorder.  Felt neurodegeneration is less likely, can follow-up in 1-2 years (2024) 2.  Restless leg syndrome 3.  Visual/olfactory hallucinations -Well controlled, no longer hallucinating  since Requip d/c 4.  Gait disturbance 5.  Depression -Encouraged to see psychiatry, medications managed by PCP currently, Abilify just added, depression improving -Kimberly Ray seems better today, is cleared, seems happier  6.  OSA on CPAP -Continues to benefit from CPAP, download from 2 machine shows overall good compliance -Encouraged to continue nightly use, greater than 4 hours, order has been sent to DME for supplies -Dr. Jannifer Franklin has retired, she will be followed by Dr. Brett Fairy, I will see her back in 1 year for CPAP compliance  Butler Denmark, AGNP-C, DNP 08/15/2021, 10:00 AM [facility] [address] [place], [place] [address] [phone]

## 2021-08-15 ENCOUNTER — Ambulatory Visit: Payer: Medicare Other | Admitting: Neurology

## 2021-08-15 ENCOUNTER — Encounter: Payer: Self-pay | Admitting: Neurology

## 2021-08-15 VITALS — BP 116/74 | HR 73 | Ht 66.0 in | Wt 188.0 lb

## 2021-08-15 DIAGNOSIS — R413 Other amnesia: Secondary | ICD-10-CM

## 2021-08-15 DIAGNOSIS — F339 Major depressive disorder, recurrent, unspecified: Secondary | ICD-10-CM | POA: Diagnosis not present

## 2021-08-15 DIAGNOSIS — G2581 Restless legs syndrome: Secondary | ICD-10-CM | POA: Diagnosis not present

## 2021-08-15 DIAGNOSIS — Z9989 Dependence on other enabling machines and devices: Secondary | ICD-10-CM

## 2021-08-15 DIAGNOSIS — G4733 Obstructive sleep apnea (adult) (pediatric): Secondary | ICD-10-CM

## 2021-08-15 NOTE — Patient Instructions (Signed)
Great to see you today! Continue CPAP use, nightly greater than 4 hours See you back in 1 year

## 2021-08-20 NOTE — Progress Notes (Signed)
Cm sent to [facility]

## 2021-08-21 ENCOUNTER — Other Ambulatory Visit: Payer: Self-pay | Admitting: Nurse Practitioner

## 2021-08-21 DIAGNOSIS — N812 Incomplete uterovaginal prolapse: Secondary | ICD-10-CM

## 2021-08-21 MED ORDER — ESTRADIOL 0.1 MG/GM VA CREA: 1.0000 | TOPICAL_CREAM | Freq: Every day | VAGINAL | 12 refills | Status: DC

## 2021-08-29 ENCOUNTER — Other Ambulatory Visit: Payer: Self-pay | Admitting: Nurse Practitioner

## 2021-08-29 ENCOUNTER — Other Ambulatory Visit: Payer: Self-pay | Admitting: Adult Health

## 2021-08-29 DIAGNOSIS — F319 Bipolar disorder, unspecified: Secondary | ICD-10-CM

## 2021-08-29 DIAGNOSIS — E782 Mixed hyperlipidemia: Secondary | ICD-10-CM

## 2021-08-29 DIAGNOSIS — K219 Gastro-esophageal reflux disease without esophagitis: Secondary | ICD-10-CM

## 2021-08-29 DIAGNOSIS — F339 Major depressive disorder, recurrent, unspecified: Secondary | ICD-10-CM

## 2021-08-29 DIAGNOSIS — F39 Unspecified mood [affective] disorder: Secondary | ICD-10-CM

## 2021-08-30 ENCOUNTER — Encounter: Payer: Self-pay | Admitting: Adult Health

## 2021-08-30 ENCOUNTER — Other Ambulatory Visit: Payer: Self-pay | Admitting: Nurse Practitioner

## 2021-08-30 DIAGNOSIS — F339 Major depressive disorder, recurrent, unspecified: Secondary | ICD-10-CM

## 2021-08-30 MED ORDER — ARIPIPRAZOLE 10 MG PO TABS: 10.0000 mg | ORAL_TABLET | Freq: Every day | ORAL | 3 refills | Status: DC

## 2021-09-10 ENCOUNTER — Telehealth: Payer: Self-pay

## 2021-09-10 ENCOUNTER — Other Ambulatory Visit: Payer: Self-pay | Admitting: Nurse Practitioner

## 2021-09-10 DIAGNOSIS — R002 Palpitations: Secondary | ICD-10-CM

## 2021-09-10 NOTE — Telephone Encounter (Signed)
Patient is wanting the referral to Cardiologist that you guys have talked about. She is still having the SOB, anxiety, and possibly the irregular heart beat.

## 2021-09-10 NOTE — Telephone Encounter (Signed)
She was to call if the palpitations persisted.  I have put the referral in to cardiology

## 2021-09-11 DIAGNOSIS — M1711 Unilateral primary osteoarthritis, right knee: Secondary | ICD-10-CM | POA: Diagnosis not present

## 2021-09-11 DIAGNOSIS — Z79891 Long term (current) use of opiate analgesic: Secondary | ICD-10-CM | POA: Diagnosis not present

## 2021-09-12 ENCOUNTER — Other Ambulatory Visit: Payer: Self-pay

## 2021-09-12 ENCOUNTER — Encounter: Payer: Self-pay | Admitting: Cardiology

## 2021-09-12 ENCOUNTER — Ambulatory Visit: Payer: Medicare Other | Admitting: Cardiology

## 2021-09-12 VITALS — BP 130/90 | HR 78 | Ht 66.5 in | Wt 186.8 lb

## 2021-09-12 DIAGNOSIS — R0609 Other forms of dyspnea: Secondary | ICD-10-CM

## 2021-09-12 DIAGNOSIS — Z79899 Other long term (current) drug therapy: Secondary | ICD-10-CM | POA: Diagnosis not present

## 2021-09-12 DIAGNOSIS — E781 Pure hyperglyceridemia: Secondary | ICD-10-CM

## 2021-09-12 DIAGNOSIS — R079 Chest pain, unspecified: Secondary | ICD-10-CM | POA: Diagnosis not present

## 2021-09-12 LAB — BASIC METABOLIC PANEL
BUN/Creatinine Ratio: 24 (ref 12–28)
BUN: 24 mg/dL (ref 8–27)
CO2: 24 mmol/L (ref 20–29)
Calcium: 10.3 mg/dL (ref 8.7–10.3)
Chloride: 104 mmol/L (ref 96–106)
Creatinine, Ser: 1 mg/dL (ref 0.57–1.00)
Glucose: 107 mg/dL — ABNORMAL HIGH (ref 70–99)
Potassium: 5 mmol/L (ref 3.5–5.2)
Sodium: 140 mmol/L (ref 134–144)
eGFR: 60 mL/min/{1.73_m2} (ref >59)

## 2021-09-12 LAB — MAGNESIUM: Magnesium: 1.9 mg/dL (ref 1.6–2.3)

## 2021-09-12 MED ORDER — ICOSAPENT ETHYL 0.5 G PO CAPS: 1.0000 | ORAL_CAPSULE | Freq: Two times a day (BID) | ORAL | 3 refills | Status: DC

## 2021-09-12 MED ORDER — METOPROLOL TARTRATE 100 MG PO TABS: ORAL_TABLET | ORAL | 0 refills | Status: DC

## 2021-09-12 MED ORDER — PREDNISONE 50 MG PO TABS: ORAL_TABLET | ORAL | 0 refills | Status: DC

## 2021-09-12 NOTE — Patient Instructions (Addendum)
Medication Instructions:  Your physician has recommended you make the following change in your medication:  STOP: Fenofibrate START: Vascepa 500 mg twice daily.  *If you need a refill on your cardiac medications before your next appointment, please call your pharmacy*   Lab Work: Your physician recommends that you return for lab work in:  TODAY: BMET, [place] In 12 weeks: Lipids - please come fasting If you have labs (blood work) drawn today and your tests are completely normal, you will receive your results only by: [place] (if you have [place]) OR A paper copy in the mail If you have any lab test that is abnormal or we need to change your treatment, we will call you to review the results.   Testing/Procedures: Your physician has requested that you have an echocardiogram. Echocardiography is a painless test that uses sound waves to create images of your heart. It provides your doctor with information about the size and shape of your heart and how well your hearts chambers and valves are working. This procedure takes approximately one hour. There are no restrictions for this procedure.    Your cardiac CT will be scheduled at one of the below locations:   [facility] [address] [place], [place] [address] [phone]   If scheduled at [facility], please arrive at the [facility] main entrance (entrance A) of [facility] 30 minutes prior to test start time. You can use the FREE valet parking offered at the main entrance (encouraged to control the heart rate for the test) Proceed to the [facility] Radiology Department (first floor) to check-in and test prep.   Please follow these instructions carefully (unless otherwise directed):   On the Night Before the Test: Be sure to Drink plenty of water. Do not consume any caffeinated/decaffeinated beverages or chocolate 12 hours prior to your test. Do not take any antihistamines 12 hours prior to  your test. If the patient has contrast allergy: Patient will need a prescription for Prednisone and very clear instructions (as follows): Prednisone 50 mg - take 13 hours prior to test Take another Prednisone 50 mg 7 hours prior to test Take another Prednisone 50 mg 1 hour prior to test Take Benadryl 50 mg 1 hour prior to test Patient must complete all four doses of above prophylactic medications. Patient will need a ride after test due to Benadryl.  On the Day of the Test: Drink plenty of water until 1 hour prior to the test. Do not eat any food 4 hours prior to the test. You may take your regular medications prior to the test.  Take metoprolol (Lopressor) two hours prior to test. FEMALES- please wear underwire-free bra if available, avoid dresses & tight clothing       After the Test: Drink plenty of water. After receiving IV contrast, you may experience a mild flushed feeling. This is normal. On occasion, you may experience a mild rash up to 24 hours after the test. This is not dangerous. If this occurs, you can take Benadryl 25 mg and increase your fluid intake. If you experience trouble breathing, this can be serious. If it is severe call 911 IMMEDIATELY. If it is mild, please call our office. If you take any of these medications: Glipizide/Metformin, Avandament, Glucavance, please do not take 48 hours after completing test unless otherwise instructed.  We will call to schedule your test 2-4 weeks out understanding that some insurance companies will need an authorization prior to  the service being performed.   For non-scheduling related questions, please contact the cardiac imaging nurse navigator should you have any questions/concerns: Marchia Bond, Cardiac Imaging Nurse Navigator Gordy Clement, Cardiac Imaging Nurse Navigator [facility] Direct Office Dial: [phone]   For scheduling needs, including cancellations and rescheduling, please call  [place], [phone].    Follow-Up: At [facility], you and your health needs are our priority.  As part of our continuing mission to provide you with exceptional heart care, we have created designated Provider Care Teams.  These Care Teams include your primary Cardiologist (physician) and Advanced Practice Providers (APPs -  Physician Assistants and Nurse Practitioners) who all work together to provide you with the care you need, when you need it.  We recommend signing up for the patient portal called "MyChart".  Sign up information is provided on this After Visit Summary.  MyChart is used to connect with patients for Virtual Visits (Telemedicine).  Patients are able to view lab/test results, encounter notes, upcoming appointments, etc.  Non-urgent messages can be sent to your provider as well.   To learn more about what you can do with MyChart, go to [email or web address].    Your next appointment:   6 month(s)  The format for your next appointment:   In Person  Provider:   Berniece Salines, DO     Other Instructions

## 2021-09-12 NOTE — Progress Notes (Signed)
Cardiology Office Note:    Date:  09/12/2021   ID:  Kailoni, Vahle Oct 06, 1950, MRN [id number]  PCP:  Unk Pinto, MD  Cardiologist:  Berniece Salines, DO  Electrophysiologist:  None   Referring MD: Magda Bernheim, NP   " I am short of breath"  History of Present Illness:    Kimberly Ray is a 71 y.o. female with a hx of OSA on cpap, hypertriglyceridemia, stage III kidney disease, prediabetes is here today to establish cardiac care.  Today she tells me that she has been experiencing significant chest pain and shortness of breath.  She tells me that she has been experience for a while.  She described as a midsternal spasm and sensation who comes and goes.  It lasted a few minutes when it comes on and then resolves itself.  There is mostly associated shortness of breath at this time.  She just tells me that the shortness of breath also has been going on for a while no other complaints at this time.  No other complaints at this time.  Past Medical History:  Diagnosis Date   Adnexal mass 04/08/2020   Anemia    Depression    Extrinsic asthma 11/09/2014   Gait abnormality 12/26/2019   GERD (gastroesophageal reflux disease)    Hay fever    Head trauma    IBS (irritable bowel syndrome)    Memory loss    Migraines    OSA on CPAP    Reflux    RLS (restless legs syndrome)    Sinus congestion     Past Surgical History:  Procedure Laterality Date   ABDOMINAL HYSTERECTOMY     BREAST BIOPSY Right 02/27/2014   [facility]reo- Benign   CHOLECYSTECTOMY  11/03/2017   Dr. Rosendo Gros   LAPAROSCOPIC HYSTERECTOMY  12/2017   NASAL SINUS SURGERY     ORIF ANKLE FRACTURE BIMALLEOLAR Left 03/14/2019   [facility], Dr. Sheran Lawless   perforated eardrum     left side   TONSILLECTOMY AND ADENOIDECTOMY     URETEROSCOPY     for ureteral stone    Current Medications: Current Meds  Medication Sig   acetaminophen (TYLENOL) 650 MG CR tablet Take 650 mg by mouth every 8 (eight) hours as needed for  pain.   ARIPiprazole (ABILIFY) 10 MG tablet Take 1 tablet (10 mg total) by mouth daily.   benzonatate (TESSALON) 200 MG capsule Take 1 tab three times a day as needed for cough.   buPROPion (WELLBUTRIN XL) 300 MG 24 hr tablet Take 1 tablet  Daily  for Mood, Focus  &  Concentration   cholecalciferol (VITAMIN D3) 25 MCG (1000 UNIT) tablet Take 4,000 Units by mouth daily.   Coenzyme Q10 (CO Q 10 PO) Take by mouth.   COVID-19 mRNA bivalent vaccine, Pfizer, (PFIZER COVID-19 VAC BIVALENT) injection Inject into the muscle.   CYANOCOBALAMIN SL Place 1 tablet under the tongue daily. Unsure of dose, OTC   ezetimibe (ZETIA) 10 MG tablet Take  1 tablet  Daily  for Cholesterol   famotidine (PEPCID) 20 MG tablet Take  1 tablet  2 x /day  to Prevent Heartburn & Indigestion   FLUoxetine (PROZAC) 10 MG capsule Take 10 mg by mouth daily.   Icosapent Ethyl (VASCEPA) 0.5 g CAPS Take 1 capsule by mouth 2 (two) times daily.   lamoTRIgine (LAMICTAL) 100 MG tablet Take  1 tablet  Daily  to Stabilize Mood   Magnesium 250 MG TABS Take by mouth. Takes  PRN   metoprolol tartrate (LOPRESSOR) 100 MG tablet Take 2 hours prior to CT   Omega-3 1000 MG CAPS Take by mouth.   omeprazole (PRILOSEC) 20 MG capsule Take  1 capsule  2 x /day  to Prevent Indigestion & Heartburn   ondansetron (ZOFRAN-ODT) 8 MG disintegrating tablet DISSOLVE 1 TABLET ON THE  TONGUE EVERY 8 HOURS AS  NEEDED FOR NAUSEA AND  VOMITING   predniSONE (DELTASONE) 50 MG tablet Take one tablet 13 hours before, take one tablet 7 hours before, take one tablet 1 hour before.   SUCRALFATE PO Take by mouth as needed.   SUMAtriptan (IMITREX) 25 MG tablet TAKE 1 TO 2 TABLETS BY  MOUTH AT ONSET OF MIGRAINE. MAY REPEAT IN 2 HOURS IF  HEADACHE PERSISTS. 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 Patient is flight attendant. Patient has college education one year      Right handed.   Caffeine- Rare.    Social Determinants of Health   Financial Resource Strain: Not on file  Food Insecurity: Not on file  Transportation Needs: Not on file  Physical Activity: Not on file  Stress: Not on file  Social Connections: Not on file     Family History: The patient's family history includes Anxiety disorder in her brother, father, and sister; Bipolar disorder in her brother; Breast cancer in her cousin; Breast cancer (age of onset: 7) in her maternal aunt; Breast cancer (age of onset: 66) in her maternal grandmother; Colon cancer (age of onset: 46) in her paternal grandmother; Dementia in her father; Depression in her sister; Diabetes in her mother and sister; Heart failure in her father; Heart failure (age of onset: 17) in her maternal grandfather; Mental illness in her paternal grandmother; Mood Disorder in her father; Osteoporosis in her brother; Stomach cancer in her maternal aunt.  ROS:   Review of Systems  Constitution: Negative for decreased appetite, fever and weight gain.  HENT: Negative for congestion, ear discharge, hoarse voice and sore throat.   Eyes: Negative for discharge, redness,  vision loss in right eye and visual halos.  Cardiovascular: Reports chest pain, dyspnea on exertion.  Negative for leg swelling, orthopnea and palpitations.  Respiratory: Negative for cough, hemoptysis, shortness of breath and snoring.   Endocrine: Negative for heat intolerance and polyphagia.  Hematologic/Lymphatic: Negative for bleeding problem. Does not bruise/bleed easily.  Skin: Negative for flushing, nail changes, rash and suspicious lesions.  Musculoskeletal: Negative for arthritis, joint pain,  muscle cramps, myalgias, neck pain and stiffness.  Gastrointestinal: Negative for abdominal pain, bowel incontinence, diarrhea and excessive appetite.  Genitourinary: Negative for decreased libido, genital sores and incomplete emptying.  Neurological: Negative for brief paralysis, focal weakness, headaches and loss of balance.  Psychiatric/Behavioral: Negative for altered mental status, depression and suicidal ideas.  Allergic/Immunologic: Negative for HIV exposure and persistent infections.    EKGs/Labs/Other Studies Reviewed:    The following studies were reviewed today:   EKG:  The ekg ordered today demonstrates sinus rhythm, heart rate 70 bpm.  Recent Labs: 04/17/2021: Magnesium 1.9 08/08/2021: ALT 23; BUN 22; Creat 1.20; Hemoglobin 12.9; Platelets 320; Potassium 4.8; Sodium 139; TSH 3.32  Recent Lipid Panel    Component Value Date/Time   CHOL 167 08/08/2021 1506   TRIG 361 (H) 08/08/2021 1506   HDL 44 (L) 08/08/2021 1506   CHOLHDL 3.8 08/08/2021 1506   VLDL 61 (H) 01/20/2017 1006   LDLCALC 80 08/08/2021 1506    Physical Exam:    VS:  BP 130/90 (BP Location: Left Arm, Patient Position: Sitting)    Pulse 78    Ht 5' 6.5" (1.689 m)    Wt 186 lb 12.8 oz (84.7 kg)    SpO2 98%    BMI 29.70 kg/m     Wt Readings from Last 3 Encounters:  09/12/21 186 lb 12.8 oz (84.7 kg)  08/15/21 188 lb (85.3 kg)  08/08/21 188 lb (85.3 kg)     GEN: Well nourished, well developed in no acute  distress HEENT: Normal NECK: No JVD; No carotid bruits LYMPHATICS: No lymphadenopathy CARDIAC: S1S2 noted,RRR, no murmurs, rubs, gallops RESPIRATORY:  Clear to auscultation without rales, wheezing or rhonchi  ABDOMEN: Soft, non-tender, non-distended, +bowel sounds, no guarding. EXTREMITIES: No edema, No cyanosis, no clubbing MUSCULOSKELETAL:  No deformity  SKIN: Warm and dry NEUROLOGIC:  Alert and oriented x 3, non-focal PSYCHIATRIC:  Normal affect, good insight  ASSESSMENT:    1. Chest pain, unspecified type   2. DOE (dyspnea on exertion)   3. Hypertriglyceridemia   4. Medication management    PLAN:    The symptoms chest pain is concerning, this patient does have intermediate risk for coronary artery disease and at this time I would like to pursue an ischemic evaluation in this patient.  Shared decision a coronary CTA at this time is appropriate.  I have discussed with the patient about the testing.  The patient has IV contrast allergy will prep the patient for her contrast allergy and is agreeable to proceed with this test.  In terms of her hypertriglyceridemia she is currently on fenofibrate as well as Zetia.  I would like to stop the fibrate start the patient on Vascepa 500 mg twice a day as this will help with cardiovascular risk reduction as well.  She will remain on Zetia.  Lipid profile will be done in 12 weeks.  Prediabetes-we talked about lifestyle modification.  She is aware of this diagnosis as well.  Chronic kidney disease stage III-patient had questions about this which I was able to answer.  Will avoid nephrotoxins.   The patient is in agreement with the above plan. The patient left the office in stable condition.  The patient will follow up in 6 months or sooner if needed.   Medication Adjustments/Labs and Tests Ordered: Current medicines are reviewed at length with the patient today.  Concerns regarding medicines are outlined above.  Orders Placed This Encounter   Procedures   CT CORONARY MORPH W/CTA  COR W/SCORE W/CA W/CM &/OR WO/CM   Lipid panel   Basic Metabolic Panel (BMET)   Magnesium   EKG 12-Lead   ECHOCARDIOGRAM COMPLETE   Meds ordered this encounter  Medications   Icosapent Ethyl (VASCEPA) 0.5 g CAPS    Sig: Take 1 capsule by mouth 2 (two) times daily.    Dispense:  180 capsule    Refill:  3   predniSONE (DELTASONE) 50 MG tablet    Sig: Take one tablet 13 hours before, take one tablet 7 hours before, take one tablet 1 hour before.    Dispense:  3 tablet    Refill:  0   metoprolol tartrate (LOPRESSOR) 100 MG tablet    Sig: Take 2 hours prior to CT    Dispense:  1 tablet    Refill:  0    Patient Instructions  Medication Instructions:  Your physician has recommended you make the following change in your medication:  STOP: Fenofibrate START: Vascepa 500 mg twice daily.  *If you need a refill on your cardiac medications before your next appointment, please call your pharmacy*   Lab Work: Your physician recommends that you return for lab work in:  TODAY: BMET, [place] In 12 weeks: Lipids - please come fasting If you have labs (blood work) drawn today and your tests are completely normal, you will receive your results only by: [place] (if you have [place]) OR A paper copy in the mail If you have any lab test that is abnormal or we need to change your treatment, we will call you to review the results.   Testing/Procedures: Your physician has requested that you have an echocardiogram. Echocardiography is a painless test that uses sound waves to create images of your heart. It provides your doctor with information about the size and shape of your heart and how well your hearts chambers and valves are working. This procedure takes approximately one hour. There are no restrictions for this procedure.    Your cardiac CT will be scheduled at one of the below locations:   [facility] [address] [place], [place] [address] [phone]   If scheduled at [facility], please arrive at the [facility] main entrance (entrance A) of [facility] 30 minutes prior to test start time. You can use the FREE valet parking offered at the main entrance (encouraged to control the heart rate for the test) Proceed to the [facility] Radiology Department (first floor) to check-in and test prep.   Please follow these instructions carefully (unless otherwise directed):   On the Night Before the Test: Be sure to Drink plenty of water. Do not consume any caffeinated/decaffeinated beverages or chocolate 12 hours prior to your test. Do not take any antihistamines 12 hours prior to your test. If the patient has contrast allergy: Patient will need a prescription for Prednisone and very clear instructions (as follows): Prednisone 50 mg - take 13 hours prior to test Take another Prednisone 50 mg 7 hours prior to test Take another Prednisone 50 mg 1 hour prior to test Take Benadryl 50 mg 1 hour prior to test Patient must complete all four doses of above prophylactic medications. Patient will need a ride after test due to Benadryl.  On the Day of the Test: Drink plenty of water until 1 hour prior to the test. Do not eat any food 4 hours prior to the test. You may take your regular medications prior to the test.  Take metoprolol (Lopressor) two hours prior to test. FEMALES- please wear underwire-free bra if available, avoid dresses & tight clothing       After the Test: Drink plenty of water. After receiving IV contrast, you may experience a mild flushed feeling. This is normal. On occasion, you may experience a mild rash up to 24 hours after the test. This is not dangerous. If this occurs, you can take Benadryl 25 mg and increase your fluid intake. If you experience trouble breathing, this can be serious. If it is severe call 911 IMMEDIATELY. If it is mild, please call our  office. If you take any of these medications: Glipizide/Metformin, Avandament, Glucavance, please do not take 48 hours after completing test unless otherwise instructed.  We will call to schedule your test 2-4 weeks out understanding that some insurance companies will need an authorization prior to the service being performed.   For non-scheduling related questions, please contact the cardiac imaging nurse navigator should you have any questions/concerns: Marchia Bond, Cardiac Imaging Nurse Navigator Gordy Clement, Cardiac Imaging Nurse Navigator [facility] Direct Office Dial: [phone]   For scheduling needs, including cancellations and rescheduling, please call [place], [phone].    Follow-Up: At [facility], you and your health needs are our priority.  As part of our continuing mission to provide you with exceptional heart care, we have created designated Provider Care Teams.  These Care Teams include your primary Cardiologist (physician) and Advanced Practice Providers (APPs -  Physician Assistants and Nurse Practitioners) who all work together to provide you with the care you need, when you need it.  We recommend signing up for the patient portal called "MyChart".  Sign up information is provided on this After Visit Summary.  MyChart is used to connect with patients for Virtual Visits (Telemedicine).  Patients are able to view lab/test results, encounter notes, upcoming appointments, etc.  Non-urgent messages can be sent to your provider as well.   To learn more about what you can do with MyChart, go to [email or web address].    Your next appointment:   6 month(s)  The format for your next appointment:   In Person  Provider:   Berniece Salines, DO     Other Instructions     Adopting a Healthy Lifestyle.  Know what a healthy weight is for you (roughly BMI <25) and aim to maintain this   Aim for 7+ servings of fruits and vegetables daily    65-80+ fluid ounces of water or unsweet tea for healthy kidneys   Limit to max 1 drink of alcohol per day; avoid smoking/tobacco   Limit animal fats in diet for cholesterol and heart health - choose grass fed whenever available   Avoid highly processed foods, and foods high in saturated/trans fats   Aim for low stress - take time to unwind and care for your mental health   Aim for 150 min of moderate intensity exercise weekly for heart health, and weights twice weekly for bone health   Aim for 7-9 hours of sleep daily   When it comes to diets, agreement about the perfect plan isnt easy to find, even among the experts. Experts at the [employer] developed an idea known as the Healthy Eating Plate. Just imagine a plate divided into logical, healthy portions.   The emphasis is on diet quality:   Load up on vegetables and fruits - one-half of your plate: Aim for color and variety, and remember  that potatoes dont count.   Go for whole grains - one-quarter of your plate: Whole wheat, barley, wheat berries, quinoa, oats, brown rice, and foods made with them. If you want pasta, go with whole wheat pasta.   Protein power - one-quarter of your plate: Fish, chicken, beans, and nuts are all healthy, versatile protein sources. Limit red meat.   The diet, however, does go beyond the plate, offering a few other suggestions.   Use healthy plant oils, such as olive, canola, soy, corn, sunflower and peanut. Check the labels, and avoid partially hydrogenated oil, which have unhealthy trans fats.   If youre thirsty, drink water. Coffee and tea are good in moderation, but skip sugary drinks and limit milk and dairy products to one or two daily servings.   The type of carbohydrate in the diet is more important than the amount. Some sources of carbohydrates, such as vegetables, fruits, whole grains, and beans-are healthier than others.   Finally, stay active  Signed, Berniece Salines, DO  09/12/2021 11:38 AM    [place]

## 2021-09-13 ENCOUNTER — Ambulatory Visit (HOSPITAL_COMMUNITY): Payer: Medicare Other | Attending: Cardiovascular Disease

## 2021-09-13 ENCOUNTER — Encounter: Payer: Self-pay | Admitting: Cardiology

## 2021-09-13 DIAGNOSIS — R0609 Other forms of dyspnea: Secondary | ICD-10-CM | POA: Diagnosis not present

## 2021-09-13 LAB — ECHOCARDIOGRAM COMPLETE
Area-P 1/2: 3.6 cm2
S' Lateral: 2.8 cm

## 2021-09-15 ENCOUNTER — Other Ambulatory Visit: Payer: Self-pay | Admitting: Nurse Practitioner

## 2021-09-15 DIAGNOSIS — F339 Major depressive disorder, recurrent, unspecified: Secondary | ICD-10-CM

## 2021-09-18 ENCOUNTER — Telehealth (HOSPITAL_COMMUNITY): Payer: Self-pay | Admitting: Emergency Medicine

## 2021-09-18 NOTE — Telephone Encounter (Signed)
Reaching out to patient to offer assistance regarding upcoming cardiac imaging study; pt verbalizes understanding of appt date/time, parking situation and where to check in, pre-test NPO status and medications ordered, and verified current allergies; name and call back number provided for further questions should they arise Kimberly Bond RN Navigator Cardiac Imaging [facility] [phone] office [phone] cell  Difficult IV 13 hr prep + 100mg  metoprolol  Arrival 1100

## 2021-09-20 ENCOUNTER — Other Ambulatory Visit: Payer: Self-pay

## 2021-09-20 ENCOUNTER — Ambulatory Visit (HOSPITAL_COMMUNITY)
Admission: RE | Admit: 2021-09-20 | Discharge: 2021-09-20 | Disposition: A | Discharge status: Home / Self Care | Payer: Medicare Other | Source: Ambulatory Visit | Attending: Cardiology | Admitting: Cardiology

## 2021-09-20 DIAGNOSIS — R079 Chest pain, unspecified: Secondary | ICD-10-CM | POA: Diagnosis not present

## 2021-09-20 DIAGNOSIS — I2510 Atherosclerotic heart disease of native coronary artery without angina pectoris: Secondary | ICD-10-CM | POA: Insufficient documentation

## 2021-09-20 DIAGNOSIS — R072 Precordial pain: Secondary | ICD-10-CM | POA: Diagnosis not present

## 2021-09-20 MED ORDER — METOPROLOL TARTRATE 5 MG/5ML IV SOLN
INTRAVENOUS | Status: AC
  Administered 2021-09-20: 5 mg via INTRAVENOUS
  Filled 2021-09-20: qty 10

## 2021-09-20 MED ORDER — IOHEXOL 350 MG/ML SOLN
95.0000 mL | Freq: Once | INTRAVENOUS | Status: AC | PRN
  Administered 2021-09-20: 95 mL via INTRAVENOUS

## 2021-09-20 MED ORDER — METOPROLOL TARTRATE 5 MG/5ML IV SOLN
INTRAVENOUS | Status: AC
  Filled 2021-09-20: qty 10

## 2021-09-20 MED ORDER — METOPROLOL TARTRATE 5 MG/5ML IV SOLN
5.0000 mg | INTRAVENOUS | Status: DC | PRN
  Administered 2021-09-20: 5 mg via INTRAVENOUS

## 2021-09-20 MED ORDER — NITROGLYCERIN 0.4 MG SL SUBL
SUBLINGUAL_TABLET | SUBLINGUAL | Status: AC
  Administered 2021-09-20: 0.8 mg via SUBLINGUAL
  Filled 2021-09-20: qty 2

## 2021-09-20 MED ORDER — DILTIAZEM HCL 25 MG/5ML IV SOLN
5.0000 mg | Freq: Once | INTRAVENOUS | Status: AC
  Administered 2021-09-20: 5 mg via INTRAVENOUS

## 2021-09-20 MED ORDER — NITROGLYCERIN 0.4 MG SL SUBL: 0.8000 mg | SUBLINGUAL_TABLET | Freq: Once | SUBLINGUAL | Status: AC

## 2021-09-20 MED ORDER — DILTIAZEM HCL 25 MG/5ML IV SOLN: 10.0000 mg | Freq: Once | INTRAVENOUS | Status: DC

## 2021-09-20 MED ORDER — DILTIAZEM HCL 25 MG/5ML IV SOLN
INTRAVENOUS | Status: AC
  Administered 2021-09-20: 10 mg via INTRAVENOUS
  Filled 2021-09-20: qty 5

## 2021-09-20 MED ORDER — DILTIAZEM HCL 25 MG/5ML IV SOLN: 10.0000 mg | Freq: Once | INTRAVENOUS | Status: AC

## 2021-09-24 ENCOUNTER — Encounter: Payer: Self-pay | Admitting: Nurse Practitioner

## 2021-09-25 ENCOUNTER — Telehealth: Payer: Self-pay

## 2021-09-25 ENCOUNTER — Other Ambulatory Visit: Payer: Self-pay

## 2021-09-25 ENCOUNTER — Telehealth (INDEPENDENT_AMBULATORY_CARE_PROVIDER_SITE_OTHER): Payer: Medicare Other | Admitting: Cardiology

## 2021-09-25 ENCOUNTER — Encounter: Payer: Self-pay | Admitting: Cardiology

## 2021-09-25 VITALS — HR 83 | Ht 66.5 in | Wt 182.0 lb

## 2021-09-25 DIAGNOSIS — I340 Nonrheumatic mitral (valve) insufficiency: Secondary | ICD-10-CM | POA: Diagnosis not present

## 2021-09-25 DIAGNOSIS — N1831 Chronic kidney disease, stage 3a: Secondary | ICD-10-CM | POA: Diagnosis not present

## 2021-09-25 DIAGNOSIS — R7303 Prediabetes: Secondary | ICD-10-CM

## 2021-09-25 DIAGNOSIS — G4733 Obstructive sleep apnea (adult) (pediatric): Secondary | ICD-10-CM

## 2021-09-25 DIAGNOSIS — E781 Pure hyperglyceridemia: Secondary | ICD-10-CM | POA: Insufficient documentation

## 2021-09-25 NOTE — Patient Instructions (Signed)
Medication Instructions:  Your physician recommends that you continue on your current medications as directed. Please refer to the Current Medication list given to you today.  *If you need a refill on your cardiac medications before your next appointment, please call your pharmacy*   Lab Work: None If you have labs (blood work) drawn today and your tests are completely normal, you will receive your results only by: [place] (if you have MyChart) OR A paper copy in the mail If you have any lab test that is abnormal or we need to change your treatment, we will call you to review the results.   Testing/Procedures: None   Follow-Up: At [facility], you and your health needs are our priority.  As part of our continuing mission to provide you with exceptional heart care, we have created designated Provider Care Teams.  These Care Teams include your primary Cardiologist (physician) and Advanced Practice Providers (APPs -  Physician Assistants and Nurse Practitioners) who all work together to provide you with the care you need, when you need it.  We recommend signing up for the patient portal called "MyChart".  Sign up information is provided on this After Visit Summary.  MyChart is used to connect with patients for Virtual Visits (Telemedicine).  Patients are able to view lab/test results, encounter notes, upcoming appointments, etc.  Non-urgent messages can be sent to your provider as well.   To learn more about what you can do with MyChart, go to [email or web address].    Your next appointment:   9 month(s)  The format for your next appointment:   In Person  Provider:   Berniece Salines, DO     Other Instructions

## 2021-09-25 NOTE — Progress Notes (Signed)
Virtual Visit via Video Note   This visit type was conducted due to national recommendations for restrictions regarding the COVID-19 Pandemic (e.g. social distancing) in an effort to limit this patient's exposure and mitigate transmission in our community.  Due to her co-morbid illnesses, this patient is at least at moderate risk for complications without adequate follow up.  This format is felt to be most appropriate for this patient at this time.  All issues noted in this document were discussed and addressed.  A limited physical exam was performed with this format.  Please refer to the patient's chart for her consent to telehealth for [facility].     Date:  09/25/2021   ID:  Kimberly, Ray 1950/11/13, MRN [id number]  Patient Location: Home Provider Location: Office/Clinic  Virtual Visit via Video  Note . I connected with the patient today by a   video enabled telemedicine application and verified that I am speaking with the correct person using two identifiers.   PCP:  Unk Pinto, MD  Cardiologist:  Berniece Salines, DO  Electrophysiologist:  None   Evaluation Performed:  Follow-Up Visit  Chief Complaint:  " I am doing fine"  History of Present Illness:    Kimberly Ray is a 71 y.o. female with history of OSA on cpap, hypertriglyceridemia, stage III kidney disease, prediabetes is here today for a follow up visit.   I first saw the patient on August 13, 2019 2020 at that time she experiencing significant shortness of breath as well as chest discomfort.  I sent the patient for coronary CTA and echocardiogram.  I also transition the patient from fenofibrate to Vascepa 500 mg twice a day she was able to get this testing done.  Since I saw the patient she was able to get her testing done.  She is here today to discuss her test results.  She tells me that shortness of breath have improved some.  The patient does not have symptoms concerning for COVID-19  infection (fever, chills, cough, or new shortness of breath).    Past Medical History:  Diagnosis Date   Adnexal mass 04/08/2020   Anemia    Depression    Extrinsic asthma 11/09/2014   Gait abnormality 12/26/2019   GERD (gastroesophageal reflux disease)    Hay fever    Head trauma    IBS (irritable bowel syndrome)    Memory loss    Migraines    OSA on CPAP    Reflux    RLS (restless legs syndrome)    Sinus congestion    Past Surgical History:  Procedure Laterality Date   ABDOMINAL HYSTERECTOMY     BREAST BIOPSY Right 02/27/2014   [facility]reo- Benign   CHOLECYSTECTOMY  11/03/2017   Dr. Rosendo Gros   LAPAROSCOPIC HYSTERECTOMY  12/2017   NASAL SINUS SURGERY     ORIF ANKLE FRACTURE BIMALLEOLAR Left 03/14/2019   [facility], Dr. Sheran Lawless   perforated eardrum     left side   TONSILLECTOMY AND ADENOIDECTOMY     URETEROSCOPY     for ureteral stone     No outpatient medications have been marked as taking for the 09/25/21 encounter (Video Visit) with Berniece Salines, DO.     Allergies:   Erythromycin, Iodinated contrast media, Penicillins, Clarithromycin, Brintellix [vortioxetine], and Cholestatin   Social History   Tobacco Use   Smoking status: Never   Smokeless tobacco: Never  Vaping Use   Vaping Use: Never used  Substance Use Topics  Alcohol use: Not Currently    Alcohol/week: 0.0 - 1.0 standard drinks    Comment: Once a year   Drug use: No     Family Hx: The patient's family history includes Anxiety disorder in her brother, father, and sister; Bipolar disorder in her brother; Breast cancer in her cousin; Breast cancer (age of onset: 50) in her maternal aunt; Breast cancer (age of onset: 63) in her maternal grandmother; Colon cancer (age of onset: 78) in her paternal grandmother; Dementia in her father; Depression in her sister; Diabetes in her mother and sister; Heart failure in her father; Heart failure (age of onset: 16) in her maternal grandfather; Mental illness in her paternal  grandmother; Mood Disorder in her father; Osteoporosis in her brother; Stomach cancer in her maternal aunt.  ROS:   Please see the history of present illness.     All other systems reviewed and are negative.   Prior CV studies:   The following studies were reviewed today:  TTE 09/13/2021 IMPRESSIONS   1. Left ventricular ejection fraction, by estimation, is 55 to 60%. The  left ventricle has normal function. The left ventricle has no regional  wall motion abnormalities. Left ventricular diastolic parameters were  normal. The average left ventricular  global longitudinal strain is -19.7 %. The global longitudinal strain is  normal.   2. Right ventricular systolic function is normal. The right ventricular  size is normal.   3. Left atrial size was mildly dilated.   4. Right atrial size was mildly dilated.   5. There is prolapse of the posterior leaflet MR appears mild but may be  underestimated Chordal SAM with no appreciable LVOT turbulence . The  mitral valve is abnormal. No evidence of mitral valve regurgitation. No  evidence of mitral stenosis. Moderate  mitral annular calcification.   6. The aortic valve is tricuspid. There is mild calcification of the  aortic valve. There is mild thickening of the aortic valve. Aortic valve  regurgitation is mild. Aortic valve sclerosis is present, with no evidence  of aortic valve stenosis.   7. The inferior vena cava is normal in size with greater than 50%  respiratory variability, suggesting right atrial pressure of 3 mmHg.   FINDINGS   Left Ventricle: Left ventricular ejection fraction, by estimation, is 55  to 60%. The left ventricle has normal function. The left ventricle has no  regional wall motion abnormalities. The average left ventricular global  longitudinal strain is -19.7 %.  The global longitudinal strain is normal. The left ventricular internal  cavity size was normal in size. There is no left ventricular hypertrophy.  Left  ventricular diastolic parameters were normal.   Right Ventricle: The right ventricular size is normal. No increase in  right ventricular wall thickness. Right ventricular systolic function is  normal.   Left Atrium: Left atrial size was mildly dilated.   Right Atrium: Right atrial size was mildly dilated.   Pericardium: There is no evidence of pericardial effusion.   Mitral Valve: There is prolapse of the posterior leaflet MR appears mild  but may be underestimated Chordal SAM with no appreciable LVOT turbulence.  The mitral valve is abnormal. There is mild thickening of the mitral valve  leaflet(s). Moderate mitral  annular calcification. No evidence of mitral valve regurgitation. No  evidence of mitral valve stenosis.   Tricuspid Valve: The tricuspid valve is normal in structure. Tricuspid  valve regurgitation is not demonstrated. No evidence of tricuspid  stenosis.   Aortic  Valve: The aortic valve is tricuspid. There is mild calcification  of the aortic valve. There is mild thickening of the aortic valve. Aortic  valve regurgitation is mild. Aortic valve sclerosis is present, with no  evidence of aortic valve stenosis.   Pulmonic Valve: The pulmonic valve was normal in structure. Pulmonic valve  regurgitation is not visualized. No evidence of pulmonic stenosis.   Aorta: The aortic root is normal in size and structure.   Venous: The inferior vena cava is normal in size with greater than 50%  respiratory variability, suggesting right atrial pressure of 3 mmHg.   IAS/Shunts: No atrial level shunt detected by color flow Doppler.   CCTA 09/19/2021   FINDINGS: Image quality: Excellent.   Noise artifact is: Limited.   Motion artifact present in mid LCx and LAD.   Coronary Arteries:  Normal coronary origin.  Right dominance.   Left main: The left main is a large caliber vessel with a normal take off from the left coronary cusp that bifurcates to form a left anterior  descending artery and a left circumflex artery. There is no plaque or stenosis.   Left anterior descending artery: The LAD is patent without evidence of plaque or stenosis. The LAD gives off 2 patent diagonal branches.   Left circumflex artery: The LCX is non-dominant and patent with no evidence of plaque or stenosis. The LCX gives off 2 patent obtuse marginal branches.   Right coronary artery: The RCA is dominant with normal take off from the right coronary cusp. There is no evidence of plaque or stenosis. The RCA terminates as a PDA and right posterolateral branch without evidence of plaque or stenosis.   Right Atrium: Right atrial size is within normal limits.   Right Ventricle: The right ventricular cavity is within normal limits.   Left Atrium: Left atrial size is normal in size with no left atrial appendage filling defect.   Left Ventricle: The ventricular cavity size is within normal limits. There are no stigmata of prior infarction. There is no abnormal filling defect.   Pulmonary arteries: Normal in size without proximal filling defect.   Pulmonary veins: Normal pulmonary venous drainage.   Pericardium: Normal thickness with no significant effusion or calcium present.   Cardiac valves: The aortic valve is trileaflet without significant calcification. The mitral valve is normal structure with mild mitral annular calcification of the posterior annulus.   Aorta: Normal caliber with no significant disease.   Extra-cardiac findings: See attached radiology report for non-cardiac structures.   IMPRESSION: 1. Coronary calcium score of 0. This was 0 percentile for age-, sex, and race-matched controls.   2.  Normal coronary origin with right dominance.   3.  Normal coronary arteries.  CAD RADS 0.   4.  Consider non cardiac etiologist of chest pain.   RECOMMENDATIONS: 1. CAD-RADS 0: No evidence of CAD (0%). Consider non-atherosclerotic causes of chest pain.   2.  CAD-RADS 1: Minimal non-obstructive CAD (0-24%). Consider non-atherosclerotic causes of chest pain. Consider preventive therapy and risk factor modification.   3. CAD-RADS 2: Mild non-obstructive CAD (25-49%). Consider non-atherosclerotic causes of chest pain. Consider preventive therapy and risk factor modification.   4. CAD-RADS 3: Moderate stenosis. Consider symptom-guided anti-ischemic pharmacotherapy as well as risk factor modification per guideline directed care. Additional analysis with CT FFR will be submitted.   5. CAD-RADS 4: Severe stenosis. (70-99% or > 50% left main). Cardiac catheterization or CT FFR is recommended. Consider symptom-guided anti-ischemic pharmacotherapy as well as  risk factor modification per guideline directed care. Invasive coronary angiography recommended with revascularization per published guideline statements.   6. CAD-RADS 5: Total coronary occlusion (100%). Consider cardiac catheterization or viability assessment. Consider symptom-guided anti-ischemic pharmacotherapy as well as risk factor modification per guideline directed care.   7. CAD-RADS N: Non-diagnostic study. Obstructive CAD can't be excluded. Alternative evaluation is recommended.   Fransico Him, MD     Electronically Signed   By: Fransico Him M.D.   On: 09/20/2021 13:17    Addended by Sueanne Margarita, MD on 09/20/2021  1:19 PM   Study Result  Narrative & Impression  EXAM: OVER-READ INTERPRETATION  CT CHEST   The following report is an over-read performed by radiologist Dr. Suzy Bouchard of [facility], [place] on 09/20/2021. This over-read does not include interpretation of cardiac or coronary anatomy or pathology. The coronary CTA interpretation by the cardiologist is attached.   COMPARISON:  None.   FINDINGS: Limited view of the lung parenchyma demonstrates no suspicious nodularity. Airways are normal.   Limited view of the mediastinum demonstrates no  adenopathy. Esophagus normal.   Limited view of the upper abdomen unremarkable.   Limited view of the skeleton and chest wall is unremarkable.   IMPRESSION: No significant extracardiac findings.   Electronically Signed: By: Suzy Bouchard M.D. On: 09/20/2021 11:47      Labs/Other Tests and Data Reviewed:    EKG:  No ECG reviewed.  Recent Labs: 08/08/2021: ALT 23; Hemoglobin 12.9; Platelets 320; TSH 3.32 09/12/2021: BUN 24; Creatinine, Ser 1.00; Magnesium 1.9; Potassium 5.0; Sodium 140   Recent Lipid Panel Lab Results  Component Value Date/Time   CHOL 167 08/08/2021 03:06 PM   TRIG 361 (H) 08/08/2021 03:06 PM   HDL 44 (L) 08/08/2021 03:06 PM   CHOLHDL 3.8 08/08/2021 03:06 PM   LDLCALC 80 08/08/2021 03:06 PM    Wt Readings from Last 3 Encounters:  09/25/21 182 lb (82.6 kg)  09/12/21 186 lb 12.8 oz (84.7 kg)  08/15/21 188 lb (85.3 kg)     Objective:    Vital Signs:  Pulse 83    Ht 5' 6.5" (1.689 m)    Wt 182 lb (82.6 kg)    BMI 28.94 kg/m      ASSESSMENT & PLAN:    Mitral regurgitation Hypertriglyceridemia Prediabetes  I discussed her testing results with her especially the echo findings.  We will repeat her echo in 6 to 9 months. Coronary CTA with no evidence of coronary artery disease. She is continued on Vascepa she has been able to tolerate this medication.  Repeat blood work will be up soon.   COVID-19 Education: The signs and symptoms of COVID-19 were discussed with the patient and how to seek care for testing (follow up with PCP or arrange E-visit).  The importance of social distancing was discussed today.  Time:   Today, I have spent 16 minutes with the patient with telehealth technology discussing the above problems.     Medication Adjustments/Labs and Tests Ordered: Current medicines are reviewed at length with the patient today.  Concerns regarding medicines are outlined above.   Tests Ordered: No orders of the defined types were placed in  this encounter.   Medication Changes: No orders of the defined types were placed in this encounter.   Follow Up:  In Person in 6 month(s)  Signed, Berniece Salines, DO  09/25/2021 11:41 AM    [place]

## 2021-09-25 NOTE — Telephone Encounter (Signed)
Called pt to go over her after visit summary. No answer at this time, unable to leave a massage at this time. AVS sent via MyChart.

## 2021-09-25 NOTE — Telephone Encounter (Signed)
Can you add this med

## 2021-09-25 NOTE — Addendum Note (Signed)
Addended by: Orvan July on: 09/25/2021 12:05 PM   Modules accepted: Orders

## 2021-09-27 DIAGNOSIS — G4733 Obstructive sleep apnea (adult) (pediatric): Secondary | ICD-10-CM | POA: Diagnosis not present

## 2021-10-01 ENCOUNTER — Other Ambulatory Visit: Payer: Self-pay | Admitting: Adult Health Nurse Practitioner

## 2021-10-01 DIAGNOSIS — K219 Gastro-esophageal reflux disease without esophagitis: Secondary | ICD-10-CM

## 2021-10-08 ENCOUNTER — Encounter: Payer: Self-pay | Admitting: Internal Medicine

## 2021-10-11 ENCOUNTER — Encounter: Payer: Self-pay | Admitting: Cardiology

## 2021-10-14 ENCOUNTER — Encounter: Payer: Self-pay | Admitting: Adult Health

## 2021-10-14 NOTE — Telephone Encounter (Signed)
Can you put these in their charts

## 2021-10-15 DIAGNOSIS — E781 Pure hyperglyceridemia: Secondary | ICD-10-CM | POA: Diagnosis not present

## 2021-10-15 LAB — LIPID PANEL
Chol/HDL Ratio: 4.1 ratio (ref 0.0–4.4)
Cholesterol, Total: 197 mg/dL (ref 100–199)
HDL: 48 mg/dL (ref >39)
LDL Chol Calc (NIH): 109 mg/dL — ABNORMAL HIGH (ref 0–99)
Triglycerides: 233 mg/dL — ABNORMAL HIGH (ref 0–149)
VLDL Cholesterol Cal: 40 mg/dL (ref 5–40)

## 2021-10-22 ENCOUNTER — Telehealth: Payer: Self-pay

## 2021-10-22 NOTE — Telephone Encounter (Deleted)
? ?  Pre-operative Risk Assessment  ?  ?Patient Name: Kimberly Ray  ?DOB: Aug 23, 1950 ?MRN: [id number]  ? ?  ? ?Request for Surgical Clearance   ? ?Procedure:   MENISCUS REPAIR ? ?Date of Surgery:  Clearance TBD                              ?   ?Surgeon:  Dr. Lyla Glassing  ?Surgeon's Group or Practice Name:  [facility] ?Phone number:  [phone] ATTN: [facility] ?Fax number:  [phone] ?  ?Type of Clearance Requested:   ?- Medical  ?  ?Type of Anesthesia:  Spinal ?  ?Additional requests/questions:   ? ?Signed, ?Claron Rosencrans L Audelia Knape   ?10/22/2021, 2:52 PM   ?

## 2021-10-22 NOTE — Telephone Encounter (Signed)
Received request for surgical clearance. Patient had office visit on 09/12/21 and video visit follow-up on 2/8 to discuss results of cardiac testing. Routing to Dr. Harriet Masson for specific recommendations due to mitral valve regurgitation and symptoms of shortness of breath in regards to clearing for upcoming surgery. ? ?Dr. Harriet Masson, please send reply to p cv div preop ?

## 2021-10-22 NOTE — Telephone Encounter (Signed)
? ?  Pre-operative Risk Assessment  ?  ?Patient Name: Kimberly Ray  ?DOB: 1950/12/19 ?MRN: [id number]  ? ?  ? ?Request for Surgical Clearance   ? ?Procedure:   MENISCUS REPAIR ? ?Date of Surgery:  Clearance TBD                              ?   ?Surgeon:  Dr.Aluisio ?Surgeon's Group or Practice Name:  [facility] ?Phone number:  [phone] Attn: [place] Chard ?Fax number:  [phone] ?  ?Type of Clearance Requested:   ?- Medical  ?  ?Type of Anesthesia:  Not Indicated ?  ?Additional requests/questions:   ? ?Signed, ?Kimberly Ray   ?10/22/2021, 3:18 PM   ?

## 2021-10-22 NOTE — Telephone Encounter (Signed)
This encounter was created in error - please disregard.

## 2021-10-23 DIAGNOSIS — M1711 Unilateral primary osteoarthritis, right knee: Secondary | ICD-10-CM | POA: Diagnosis not present

## 2021-10-24 ENCOUNTER — Telehealth: Payer: Self-pay

## 2021-10-24 NOTE — Telephone Encounter (Signed)
? ?  Primary Cardiologist: Berniece Salines, DO ? ?Chart reviewed as part of pre-operative protocol coverage. Given past medical history and time since last visit, based on ACC/AHA guidelines, Caidyn Blossom would be at acceptable risk for the planned procedure without further cardiovascular testing.  ? ?Her RCRI is a class I risk, 0.4% risk of major cardiac event. ? ?I will route this recommendation to the requesting party via Epic fax function and remove from pre-op pool. ? ?Please call with questions. ? ?Jossie Ng. Ronnika Collett NP-C ? ?  ?10/24/2021, 3:54 PM ?[place] ?[address] ?Office [phone] Fax [phone] ? ? ? ? ?

## 2021-10-24 NOTE — Telephone Encounter (Signed)
? ?  Pre-operative Risk Assessment  ?  ?Patient Name: Kimberly Ray  ?DOB: 04/18/1951 ?MRN: [id number]  ? ?  ? ?Request for Surgical Clearance   ? ?Procedure:   Right total knee arthroplasty ? ?Date of Surgery:  Clearance 03/10/22                              ?   ?Surgeon:  Dr.Frank Aluisio ?Surgeon's Group or Practice Name:  [facility] ?Phone number:  [phone] ?Fax number:  [phone] ?  ?Type of Clearance Requested:   ?- Medical  ?  ?Type of Anesthesia:   Choice ?  ?Additional requests/questions:  Please fax a copy of clearance to the surgeon's office. ? ?Signed, ?Thedore Mins Bessy Reaney   ?10/24/2021, 3:29 PM  ? ?

## 2021-10-30 NOTE — Telephone Encounter (Signed)
Patient was cleared in separate cardiac clearance note. ?

## 2021-11-29 ENCOUNTER — Ambulatory Visit: Payer: Medicare Other | Admitting: Adult Health

## 2021-12-12 ENCOUNTER — Encounter: Payer: Self-pay | Admitting: Adult Health

## 2021-12-12 ENCOUNTER — Ambulatory Visit (INDEPENDENT_AMBULATORY_CARE_PROVIDER_SITE_OTHER): Payer: Medicare Other | Admitting: Adult Health

## 2021-12-12 VITALS — BP 124/78 | HR 92 | Temp 97.9°F | Resp 17 | Ht 66.5 in | Wt 189.6 lb

## 2021-12-12 DIAGNOSIS — K219 Gastro-esophageal reflux disease without esophagitis: Secondary | ICD-10-CM

## 2021-12-12 DIAGNOSIS — G4733 Obstructive sleep apnea (adult) (pediatric): Secondary | ICD-10-CM

## 2021-12-12 DIAGNOSIS — E559 Vitamin D deficiency, unspecified: Secondary | ICD-10-CM | POA: Diagnosis not present

## 2021-12-12 DIAGNOSIS — R6889 Other general symptoms and signs: Secondary | ICD-10-CM

## 2021-12-12 DIAGNOSIS — R7309 Other abnormal glucose: Secondary | ICD-10-CM | POA: Diagnosis not present

## 2021-12-12 DIAGNOSIS — E669 Obesity, unspecified: Secondary | ICD-10-CM

## 2021-12-12 DIAGNOSIS — M8580 Other specified disorders of bone density and structure, unspecified site: Secondary | ICD-10-CM

## 2021-12-12 DIAGNOSIS — E782 Mixed hyperlipidemia: Secondary | ICD-10-CM | POA: Diagnosis not present

## 2021-12-12 DIAGNOSIS — R7303 Prediabetes: Secondary | ICD-10-CM | POA: Diagnosis not present

## 2021-12-12 DIAGNOSIS — N1831 Chronic kidney disease, stage 3a: Secondary | ICD-10-CM

## 2021-12-12 DIAGNOSIS — Z0000 Encounter for general adult medical examination without abnormal findings: Secondary | ICD-10-CM

## 2021-12-12 DIAGNOSIS — Z0001 Encounter for general adult medical examination with abnormal findings: Secondary | ICD-10-CM

## 2021-12-12 DIAGNOSIS — G2581 Restless legs syndrome: Secondary | ICD-10-CM | POA: Diagnosis not present

## 2021-12-12 DIAGNOSIS — E538 Deficiency of other specified B group vitamins: Secondary | ICD-10-CM

## 2021-12-12 DIAGNOSIS — K760 Fatty (change of) liver, not elsewhere classified: Secondary | ICD-10-CM | POA: Diagnosis not present

## 2021-12-12 DIAGNOSIS — I340 Nonrheumatic mitral (valve) insufficiency: Secondary | ICD-10-CM

## 2021-12-12 DIAGNOSIS — Z79899 Other long term (current) drug therapy: Secondary | ICD-10-CM

## 2021-12-12 DIAGNOSIS — Z1211 Encounter for screening for malignant neoplasm of colon: Secondary | ICD-10-CM

## 2021-12-12 DIAGNOSIS — F988 Other specified behavioral and emotional disorders with onset usually occurring in childhood and adolescence: Secondary | ICD-10-CM

## 2021-12-12 DIAGNOSIS — F339 Major depressive disorder, recurrent, unspecified: Secondary | ICD-10-CM

## 2021-12-12 DIAGNOSIS — E66811 Obesity, class 1: Secondary | ICD-10-CM

## 2021-12-12 MED ORDER — EZETIMIBE 10 MG PO TABS: ORAL_TABLET | ORAL | 3 refills | Status: DC

## 2021-12-12 NOTE — Progress Notes (Signed)
?ANNUAL WELLNESS VISIT AND FOLLOW UP ? ? ?Assessment:  ? ? ?Encounter for Annual Physical Exam with abnormal findings ?Due annually  ?Health Maintenance reviewed ?Healthy lifestyle reviewed and goals set ?- schedule mammogram ?- referral placed for colonoscopy  ? ?Hyperlipidemia ?- On zetia, vascepa ?- LDL goal <100 ?-cont diet and exercise ?- Lipid panel - defer, cardiology planning to follow up ? ? Hyperglycemia (prediabetes)  ?Discussed general issues about diabetes pathophysiology and management., Educational material distributed., Suggested low cholesterol diet., Encouraged aerobic exercise., Discussed foot care., Reminded to get yearly retinal exam. ? - HbA1C ? ? Essential hypertension ?- mild, currrently at goal off of medication with lifestyle, DASH diet, exercise and monitor at home. Call if greater than 130/80.  ? ? Vitamin D deficiency ?Supplement PRN ?Check vitamin D level ? ?Osteopenia ?- get dexa q2y, UTD, continue Vit D and Ca, weight bearing exercises ? ?ADD (attention deficit disorder) ?Off of medication since retirement;  ?Psych following ? ?CKD 3 ([place]) ?Increase fluids, avoid NSAIDS, monitor sugars, will monitor ?- CMP/GFR, UA, microalbumin ? ?Fatty liver ?Weight loss advised, avoid alcohol/tylenol, will monitor LFTs ? ?OSA on CPAP ?Endorses 100 % compliance, weight loss encouraged ? ?Gastroesophageal reflux disease, esophagitis presence not specified ?Continue H2i ?Discussed diet, avoiding triggers and other lifestyle changes ? ?RLS (restless legs syndrome) ?Doing well off of meds at this time ? ?Recurrent Major depression, chronic (HCC) ?Hx of severe recurrent depression, now managed by psych ?Follow up to discuss meds ?Lifestyle discussed: diet/exerise, sleep hygiene, stress management, hydration ? ?Obesity - BMI 30 ?Long discussion about weight loss, diet, and exercise ?Recommended diet heavy in fruits and veggies and low in animal meats, cheeses, and dairy products, appropriate calorie  intake ?Discussed appropriate weight for height  ?Follow up at next visit ? ?Memory changes ?? R/t severe depression, neuro/psych following ?She will follow up with psych for management of severe mood ?Hallucinations resolved off of requip/gabapentin ?Stable -  ? ?Nonrheumatic mitral valve regurgitation ?Dr. Sherren Mocha following, planning 6-9 month ECHO follow up ?No concerning sx today  ? ?Orders Placed This Encounter  ?Procedures  ? CBC with Differential/Platelet  ? COMPLETE METABOLIC PANEL WITH GFR  ? Magnesium  ? TSH  ? Hemoglobin A1c  ? Vitamin B12  ? Ambulatory referral to Gastroenterology  ? ?Over 40 minutes of exam, counseling, chart review and critical decision making was performed ?Future Appointments  ?Date Time Provider [place]  ?03/13/2022 11:00 AM [facility] PAT 1 [facility] None  ?04/18/2022 10:00 AM Liane Comber, NP [facility] None  ?08/19/2022 10:45 AM [name], NP [facility] None  ?12/16/2022  4:00 PM [place], Kenney Houseman, NP [facility] None  ? ? ? ?Plan:  ? ?During the course of the visit the patient was educated and counseled about appropriate screening and preventive services including:  ? ?Pneumococcal vaccine  ?Prevnar 13 ?Influenza vaccine ?Td vaccine ?Screening electrocardiogram ?Bone densitometry screening ?Colorectal cancer screening ?Diabetes screening ?Glaucoma screening ?Nutrition counseling  ?Advanced directives: requested ? ? ?Subjective:  ?Kimberly Ray is a 71 y.o. female who presents for CPE. She has Mixed hyperlipidemia; ADD (attention deficit disorder); OSA on CPAP; Major depression, recurrent, chronic ([place]); Vitamin D deficiency; GERD ; Obesity (BMI 30.0-34.9); Circadian rhythm sleep disorder; Periodic limb movement disorder (PLMD); Fatty liver; Osteopenia; Memory changes; Gait abnormality; RLS (restless legs syndrome); B12 deficiency; Seasonal allergies; Nonrheumatic mitral valve regurgitation; Prediabetes; Hypertriglyceridemia; and Stage 3a chronic kidney disease ([place]) on  their problem list. ? ?She is single, retired Programme researcher, broadcasting/film/video.  ? ?  She has long hx of severe depression, now following with NP Domingo Cocking for meds and Remo Lipps for counseling. Recently on lamictal/wellbutrin, off of cymbalta, now on prozac and abilify. Still not doing well, she plans to follow up with provider.  ? ? ?She was reporting memory changes, visual halluciations, unremarkable MRI 01/2020, was evaluated by Dr. Nicole Kindred for neuropsychological evaluation, felt the issue was more related to executive control function,  depression, aging, attention deficit disorder.  Felt neurodegeneration is less likely. Did recommend to stop requip/gabapentin for RLS which resolved hallucinations. Follows with Butler Denmark, NP as needed.  ? ?Discussing right knee replacement with Dr. Wynelle Link, planning replacement in August 2023.  ? ?She has OSA on CPAP, endorses 100% compliance and feels this has helped RLS. Typically wears around 7-8 hours each night. ? ?Has GERD and well managed on famotidine BID.  ? ?BMI is Body mass index is 30.14 kg/m?., she has not been working on diet and exercise, water aerobics was causing knee pain, stopped and pending surgery.  ?Wt Readings from Last 3 Encounters:  ?12/12/21 189 lb 9.6 oz (86 kg)  ?09/25/21 182 lb (82.6 kg)  ?09/12/21 186 lb 12.8 oz (84.7 kg)  ? ?She does not check BP at home, today their BP is BP: 124/78 ?She does workout. She denies chest pain, dizziness.  ? ?She was having exertional dyspnea/chest pain, saw cardiologist Dr. Harriet Masson 08/2021, had CT coronary 09/20/21 with CAD RADS 0, ECHO 09/13/2021 showed EF 55-60% but did show MV with mild posterior leaflet prolapse, mild thickening of leaflets, moderate mitral annular calcification, planning for 6-9 month ECHO follow up.  ? ?She is on cholesterol medication (on zetia 10 mg, off of fenofibrate and started on vascepa, cardiology is planning recheck) and denies myalgias. Her cholesterol is not at goal. The cholesterol last visit was:   ?Lab  Results  ?Component Value Date  ? CHOL 197 10/15/2021  ? HDL 48 10/15/2021  ? LDLCALC 109 (H) 10/15/2021  ? TRIG 233 (H) 10/15/2021  ? CHOLHDL 4.1 10/15/2021  ? ?She has had prediabetes predating 2014. She has not been working on diet and exercise for prediabetes, and denies increased appetite, nausea, paresthesia of the feet, polydipsia, polyuria, visual disturbances, vomiting and weight loss. Last A1C in the office was:  ?Lab Results  ?Component Value Date  ? HGBA1C 6.0 (H) 04/17/2021  ? ?She has stable CKD IIIa monitored at this office. Last GFR: ?Lab Results  ?Component Value Date  ? EGFR 60 09/12/2021  ? EGFR 48 (L) 08/08/2021  ? EGFR 54 (L) 04/17/2021  ? ?Patient is on Vitamin D supplement and was at goal at last check:    ?Lab Results  ?Component Value Date  ? VD25OH 55 08/08/2021  ?   ?Taking B12 SL 2500 mcg irregularly. She reports fatigue and requests recheck of B12 today -  ?Lab Results  ?Component Value Date  ? [id number] 772 08/08/2021  ? ? ? ?Medication Review: ?Current Outpatient Medications on File Prior to Visit  ?Medication Sig Dispense Refill  ? acetaminophen (TYLENOL) 650 MG CR tablet Take 650 mg by mouth every 8 (eight) hours as needed for pain.    ? ARIPiprazole (ABILIFY) 10 MG tablet Take 1 tablet (10 mg total) by mouth daily. (Patient taking differently: Take 5 mg by mouth daily.) 30 tablet 3  ? buPROPion (WELLBUTRIN XL) 300 MG 24 hr tablet Take 1 tablet  Daily  for Mood, Focus  &  Concentration 90 tablet 3  ? cholecalciferol (  VITAMIN D3) 25 MCG (1000 UNIT) tablet Take 4,000 Units by mouth daily.    ? Coenzyme Q10 (CO Q 10 PO) Take by mouth in the morning and at bedtime.    ? Cyanocobalamin 2500 MCG SUBL Place 1 tablet under the tongue daily.    ? famotidine (PEPCID) 20 MG tablet Take  1 tablet  2 x /day  to Prevent Heartburn & Indigestion (Patient taking differently: 20 mg 2 (two) times daily as needed for heartburn or indigestion.) 180 tablet 3  ? FLUoxetine (PROZAC) 10 MG capsule Take 20  mg by mouth daily.    ? Icosapent Ethyl (VASCEPA) 0.5 g CAPS Take 1 capsule by mouth 2 (two) times daily. 180 capsule 3  ? lamoTRIgine (LAMICTAL) 100 MG tablet Take  1 tablet  Daily  to Stabilize Mood 90 ta

## 2021-12-13 LAB — CBC WITH DIFFERENTIAL/PLATELET
Absolute Monocytes: 901 cells/uL (ref 200–950)
Basophils Absolute: 50 cells/uL (ref 0–200)
Basophils Relative: 0.8 %
Eosinophils Absolute: 101 cells/uL (ref 15–500)
Eosinophils Relative: 1.6 %
HCT: 41.3 % (ref 35.0–45.0)
Hemoglobin: 13.2 g/dL (ref 11.7–15.5)
Lymphs Abs: 1972 cells/uL (ref 850–3900)
MCH: 27.7 pg (ref 27.0–33.0)
MCHC: 32 g/dL (ref 32.0–36.0)
MCV: 86.6 fL (ref 80.0–100.0)
MPV: 10.3 fL (ref 7.5–12.5)
Monocytes Relative: 14.3 %
Neutro Abs: 3276 cells/uL (ref 1500–7800)
Neutrophils Relative %: 52 %
Platelets: 302 10*3/uL (ref 140–400)
RBC: 4.77 10*6/uL (ref 3.80–5.10)
RDW: 12.9 % (ref 11.0–15.0)
Total Lymphocyte: 31.3 %
WBC: 6.3 10*3/uL (ref 3.8–10.8)

## 2021-12-13 LAB — COMPLETE METABOLIC PANEL WITH GFR
AG Ratio: 2.1 (calc) (ref 1.0–2.5)
ALT: 29 U/L (ref 6–29)
AST: 29 U/L (ref 10–35)
Albumin: 4.6 g/dL (ref 3.6–5.1)
Alkaline phosphatase (APISO): 76 U/L (ref 37–153)
BUN: 17 mg/dL (ref 7–25)
CO2: 28 mmol/L (ref 20–32)
Calcium: 10.3 mg/dL (ref 8.6–10.4)
Chloride: 105 mmol/L (ref 98–110)
Creat: 0.93 mg/dL (ref 0.60–1.00)
Globulin: 2.2 g/dL (calc) (ref 1.9–3.7)
Glucose, Bld: 134 mg/dL — ABNORMAL HIGH (ref 65–99)
Potassium: 5.1 mmol/L (ref 3.5–5.3)
Sodium: 141 mmol/L (ref 135–146)
Total Bilirubin: 0.3 mg/dL (ref 0.2–1.2)
Total Protein: 6.8 g/dL (ref 6.1–8.1)
eGFR: 66 mL/min/{1.73_m2} (ref >=60)

## 2021-12-13 LAB — HEMOGLOBIN A1C
Hgb A1c MFr Bld: 6 % of total Hgb — ABNORMAL HIGH (ref <5.7)
Mean Plasma Glucose: 126 mg/dL
eAG (mmol/L): 7 mmol/L

## 2021-12-13 LAB — VITAMIN B12: Vitamin B-12: 1022 pg/mL (ref 200–1100)

## 2021-12-13 LAB — TSH: TSH: 2.63 mIU/L (ref 0.40–4.50)

## 2021-12-13 LAB — MAGNESIUM: Magnesium: 1.9 mg/dL (ref 1.5–2.5)

## 2022-01-03 ENCOUNTER — Encounter: Payer: Self-pay | Admitting: Internal Medicine

## 2022-02-27 ENCOUNTER — Telehealth: Payer: Self-pay | Admitting: Adult Health

## 2022-02-27 ENCOUNTER — Other Ambulatory Visit: Payer: Self-pay | Admitting: Adult Health

## 2022-02-27 DIAGNOSIS — M25569 Pain in unspecified knee: Secondary | ICD-10-CM

## 2022-02-27 NOTE — Telephone Encounter (Signed)
Pt was seen by her orthopedic and told that she needs surgery but she is wanting  a second opinion. She has an appointment for tomorrow at 4 but they juts called her and told her she'd need a referral. I told her I didn't know if we could do it that quickly but that someone would call her to update her

## 2022-02-28 DIAGNOSIS — M25561 Pain in right knee: Secondary | ICD-10-CM | POA: Diagnosis not present

## 2022-02-28 DIAGNOSIS — M25569 Pain in unspecified knee: Secondary | ICD-10-CM | POA: Diagnosis not present

## 2022-02-28 DIAGNOSIS — M1611 Unilateral primary osteoarthritis, right hip: Secondary | ICD-10-CM | POA: Diagnosis not present

## 2022-02-28 DIAGNOSIS — M25551 Pain in right hip: Secondary | ICD-10-CM | POA: Diagnosis not present

## 2022-02-28 DIAGNOSIS — M1711 Unilateral primary osteoarthritis, right knee: Secondary | ICD-10-CM | POA: Diagnosis not present

## 2022-03-03 NOTE — Telephone Encounter (Signed)
Please put in chart

## 2022-03-04 DIAGNOSIS — M25561 Pain in right knee: Secondary | ICD-10-CM | POA: Diagnosis not present

## 2022-03-06 ENCOUNTER — Other Ambulatory Visit (HOSPITAL_COMMUNITY): Payer: Medicare Other

## 2022-03-10 ENCOUNTER — Ambulatory Visit: Admit: 2022-03-10 | Payer: Medicare Other | Admitting: Orthopedic Surgery

## 2022-03-10 SURGERY — ARTHROPLASTY, KNEE, TOTAL
Anesthesia: Choice | Site: Knee | Laterality: Right

## 2022-03-11 DIAGNOSIS — M1711 Unilateral primary osteoarthritis, right knee: Secondary | ICD-10-CM | POA: Diagnosis not present

## 2022-03-11 DIAGNOSIS — Z01818 Encounter for other preprocedural examination: Secondary | ICD-10-CM | POA: Diagnosis not present

## 2022-03-11 DIAGNOSIS — G43809 Other migraine, not intractable, without status migrainosus: Secondary | ICD-10-CM | POA: Diagnosis not present

## 2022-03-11 DIAGNOSIS — N182 Chronic kidney disease, stage 2 (mild): Secondary | ICD-10-CM | POA: Diagnosis not present

## 2022-03-11 DIAGNOSIS — G2581 Restless legs syndrome: Secondary | ICD-10-CM | POA: Diagnosis not present

## 2022-03-11 DIAGNOSIS — F32A Depression, unspecified: Secondary | ICD-10-CM | POA: Diagnosis not present

## 2022-03-11 DIAGNOSIS — I340 Nonrheumatic mitral (valve) insufficiency: Secondary | ICD-10-CM | POA: Diagnosis not present

## 2022-03-11 DIAGNOSIS — E782 Mixed hyperlipidemia: Secondary | ICD-10-CM | POA: Diagnosis not present

## 2022-03-11 DIAGNOSIS — K760 Fatty (change of) liver, not elsewhere classified: Secondary | ICD-10-CM | POA: Diagnosis not present

## 2022-03-11 DIAGNOSIS — R7303 Prediabetes: Secondary | ICD-10-CM | POA: Diagnosis not present

## 2022-03-11 DIAGNOSIS — I129 Hypertensive chronic kidney disease with stage 1 through stage 4 chronic kidney disease, or unspecified chronic kidney disease: Secondary | ICD-10-CM | POA: Diagnosis not present

## 2022-03-13 ENCOUNTER — Encounter (HOSPITAL_COMMUNITY): Payer: Medicare Other

## 2022-03-17 DIAGNOSIS — M1711 Unilateral primary osteoarthritis, right knee: Secondary | ICD-10-CM | POA: Diagnosis not present

## 2022-03-19 ENCOUNTER — Other Ambulatory Visit: Payer: Self-pay | Admitting: Nurse Practitioner

## 2022-03-19 ENCOUNTER — Encounter: Payer: Self-pay | Admitting: Nurse Practitioner

## 2022-03-19 DIAGNOSIS — G43009 Migraine without aura, not intractable, without status migrainosus: Secondary | ICD-10-CM

## 2022-03-19 MED ORDER — NURTEC 75 MG PO TBDP: 1.0000 | ORAL_TABLET | ORAL | 1 refills | Status: DC | PRN

## 2022-03-20 ENCOUNTER — Other Ambulatory Visit: Payer: Self-pay | Admitting: Nurse Practitioner

## 2022-03-20 ENCOUNTER — Encounter: Payer: Self-pay | Admitting: Nurse Practitioner

## 2022-03-20 DIAGNOSIS — G43009 Migraine without aura, not intractable, without status migrainosus: Secondary | ICD-10-CM

## 2022-03-20 MED ORDER — SUMATRIPTAN SUCCINATE 25 MG PO TABS: ORAL_TABLET | ORAL | 0 refills | Status: DC

## 2022-03-20 NOTE — Progress Notes (Signed)
Nurtec has not been covered, it was denied I sent in the prescription for sumatriptan take infrequently

## 2022-03-24 ENCOUNTER — Ambulatory Visit: Admit: 2022-03-24 | Payer: Medicare Other | Admitting: Orthopedic Surgery

## 2022-03-24 DIAGNOSIS — M1711 Unilateral primary osteoarthritis, right knee: Secondary | ICD-10-CM | POA: Diagnosis not present

## 2022-03-24 SURGERY — ARTHROPLASTY, KNEE, TOTAL
Anesthesia: Choice | Site: Knee | Laterality: Right

## 2022-04-17 DIAGNOSIS — Z881 Allergy status to other antibiotic agents status: Secondary | ICD-10-CM | POA: Diagnosis not present

## 2022-04-17 DIAGNOSIS — G2581 Restless legs syndrome: Secondary | ICD-10-CM | POA: Diagnosis not present

## 2022-04-17 DIAGNOSIS — G43909 Migraine, unspecified, not intractable, without status migrainosus: Secondary | ICD-10-CM | POA: Diagnosis not present

## 2022-04-17 DIAGNOSIS — Z539 Procedure and treatment not carried out, unspecified reason: Secondary | ICD-10-CM | POA: Diagnosis not present

## 2022-04-17 DIAGNOSIS — I129 Hypertensive chronic kidney disease with stage 1 through stage 4 chronic kidney disease, or unspecified chronic kidney disease: Secondary | ICD-10-CM | POA: Diagnosis not present

## 2022-04-17 DIAGNOSIS — N189 Chronic kidney disease, unspecified: Secondary | ICD-10-CM | POA: Diagnosis not present

## 2022-04-17 DIAGNOSIS — G4733 Obstructive sleep apnea (adult) (pediatric): Secondary | ICD-10-CM | POA: Diagnosis not present

## 2022-04-17 DIAGNOSIS — M1711 Unilateral primary osteoarthritis, right knee: Secondary | ICD-10-CM | POA: Diagnosis not present

## 2022-04-17 DIAGNOSIS — Z880 Allergy status to penicillin: Secondary | ICD-10-CM | POA: Diagnosis not present

## 2022-04-17 DIAGNOSIS — E785 Hyperlipidemia, unspecified: Secondary | ICD-10-CM | POA: Diagnosis not present

## 2022-04-17 DIAGNOSIS — K219 Gastro-esophageal reflux disease without esophagitis: Secondary | ICD-10-CM | POA: Diagnosis not present

## 2022-04-17 DIAGNOSIS — Z79899 Other long term (current) drug therapy: Secondary | ICD-10-CM | POA: Diagnosis not present

## 2022-04-17 DIAGNOSIS — Z888 Allergy status to other drugs, medicaments and biological substances status: Secondary | ICD-10-CM | POA: Diagnosis not present

## 2022-04-17 DIAGNOSIS — Z91041 Radiographic dye allergy status: Secondary | ICD-10-CM | POA: Diagnosis not present

## 2022-04-18 ENCOUNTER — Ambulatory Visit (INDEPENDENT_AMBULATORY_CARE_PROVIDER_SITE_OTHER): Payer: Medicare Other | Admitting: Nurse Practitioner

## 2022-04-18 ENCOUNTER — Encounter: Payer: Self-pay | Admitting: Nurse Practitioner

## 2022-04-18 VITALS — BP 118/76 | HR 73 | Temp 97.9°F | Resp 16 | Ht 66.5 in | Wt 193.0 lb

## 2022-04-18 DIAGNOSIS — R7303 Prediabetes: Secondary | ICD-10-CM | POA: Diagnosis not present

## 2022-04-18 DIAGNOSIS — E782 Mixed hyperlipidemia: Secondary | ICD-10-CM | POA: Diagnosis not present

## 2022-04-18 DIAGNOSIS — M8580 Other specified disorders of bone density and structure, unspecified site: Secondary | ICD-10-CM

## 2022-04-18 DIAGNOSIS — G4733 Obstructive sleep apnea (adult) (pediatric): Secondary | ICD-10-CM

## 2022-04-18 DIAGNOSIS — E559 Vitamin D deficiency, unspecified: Secondary | ICD-10-CM | POA: Diagnosis not present

## 2022-04-18 DIAGNOSIS — K760 Fatty (change of) liver, not elsewhere classified: Secondary | ICD-10-CM

## 2022-04-18 DIAGNOSIS — N1831 Chronic kidney disease, stage 3a: Secondary | ICD-10-CM | POA: Diagnosis not present

## 2022-04-18 DIAGNOSIS — K219 Gastro-esophageal reflux disease without esophagitis: Secondary | ICD-10-CM

## 2022-04-18 DIAGNOSIS — Z0000 Encounter for general adult medical examination without abnormal findings: Secondary | ICD-10-CM | POA: Diagnosis not present

## 2022-04-18 DIAGNOSIS — R0989 Other specified symptoms and signs involving the circulatory and respiratory systems: Secondary | ICD-10-CM

## 2022-04-18 DIAGNOSIS — Z0001 Encounter for general adult medical examination with abnormal findings: Secondary | ICD-10-CM

## 2022-04-18 DIAGNOSIS — I340 Nonrheumatic mitral (valve) insufficiency: Secondary | ICD-10-CM

## 2022-04-18 DIAGNOSIS — E669 Obesity, unspecified: Secondary | ICD-10-CM

## 2022-04-18 DIAGNOSIS — R413 Other amnesia: Secondary | ICD-10-CM | POA: Diagnosis not present

## 2022-04-18 DIAGNOSIS — F988 Other specified behavioral and emotional disorders with onset usually occurring in childhood and adolescence: Secondary | ICD-10-CM

## 2022-04-18 DIAGNOSIS — F339 Major depressive disorder, recurrent, unspecified: Secondary | ICD-10-CM

## 2022-04-18 DIAGNOSIS — G2581 Restless legs syndrome: Secondary | ICD-10-CM

## 2022-04-18 NOTE — Progress Notes (Unsigned)
Saw Dr. Leonides Schanz OA of right knee/ Ortho - Total knee arthroplasty   ANNUAL WELLNESS VISIT AND FOLLOW UP   Assessment:    Encounter for Annual Physical Exam with abnormal findings Due annually  Health Maintenance reviewed Healthy lifestyle reviewed and goals set - schedule mammogram - referral placed for colonoscopy   Hyperlipidemia - On zetia, vascepa - LDL goal <100 -cont diet and exercise - Lipid panel - defer, cardiology planning to follow up   Hyperglycemia (prediabetes)  Discussed general issues about diabetes pathophysiology and management., Educational material distributed., Suggested low cholesterol diet., Encouraged aerobic exercise., Discussed foot care., Reminded to get yearly retinal exam.  - HbA1C   Essential hypertension - mild, currrently at goal off of medication with lifestyle, DASH diet, exercise and monitor at home. Call if greater than 130/80.    Vitamin D deficiency Supplement PRN Check vitamin D level  Osteopenia - get dexa q2y, UTD, continue Vit D and Ca, weight bearing exercises  ADD (attention deficit disorder) Off of medication since retirement;  Psych following  CKD 3 (HCC) Increase fluids, avoid NSAIDS, monitor sugars, will monitor - CMP/GFR, UA, microalbumin  Fatty liver Weight loss advised, avoid alcohol/tylenol, will monitor LFTs  OSA on CPAP Endorses 100 % compliance, weight loss encouraged  Gastroesophageal reflux disease, esophagitis presence not specified Continue H2i Discussed diet, avoiding triggers and other lifestyle changes  RLS (restless legs syndrome) Doing well off of meds at this time  Recurrent Major depression, chronic (HCC) Hx of severe recurrent depression, now managed by psych Follow up to discuss meds Lifestyle discussed: diet/exerise, sleep hygiene, stress management, hydration  Obesity - BMI 30 Long discussion about weight loss, diet, and exercise Recommended diet heavy in fruits and veggies and low in  animal meats, cheeses, and dairy products, appropriate calorie intake Discussed appropriate weight for height  Follow up at next visit  Memory changes ? R/t severe depression, neuro/psych following She will follow up with psych for management of severe mood Hallucinations resolved off of requip/gabapentin Stable -   Nonrheumatic mitral valve regurgitation Dr. Sherren Mocha following, planning 6-9 month ECHO follow up No concerning sx today   No orders of the defined types were placed in this encounter.  Over 40 minutes of exam, counseling, chart review and critical decision making was performed Future Appointments  Date Time Provider [place]  04/18/2022 10:00 AM Darrol Jump, NP [facility] None  08/19/2022 10:45 AM [name], NP [facility] None  12/16/2022  4:00 PM Darrol Jump, NP [facility] None     Plan:   During the course of the visit the patient was educated and counseled about appropriate screening and preventive services including:   Pneumococcal vaccine  Prevnar 13 Influenza vaccine Td vaccine Screening electrocardiogram Bone densitometry screening Colorectal cancer screening Diabetes screening Glaucoma screening Nutrition counseling  Advanced directives: requested   Subjective:  Kimberly Ray is a 71 y.o. female who presents for CPE. She has Mixed hyperlipidemia; ADD (attention deficit disorder); OSA on CPAP; Major depression, recurrent, chronic ([place]); Vitamin D deficiency; GERD ; Obesity (BMI 30.0-34.9); Circadian rhythm sleep disorder; Periodic limb movement disorder (PLMD); Fatty liver; Osteopenia; Memory changes; Gait abnormality; RLS (restless legs syndrome); B12 deficiency; Seasonal allergies; Nonrheumatic mitral valve regurgitation; Prediabetes; Hypertriglyceridemia; and Stage 3a chronic kidney disease ([place]) on their problem list.  She is single, retired Programme researcher, broadcasting/film/video.   She has long hx of severe depression, now following with NP  Domingo Cocking for meds and Remo Lipps for counseling. Recently on lamictal/wellbutrin, off of  cymbalta, now on prozac and abilify. Still not doing well, she plans to follow up with provider.    She was reporting memory changes, visual halluciations, unremarkable MRI 01/2020, was evaluated by Dr. Nicole Kindred for neuropsychological evaluation, felt the issue was more related to executive control function,  depression, aging, attention deficit disorder.  Felt neurodegeneration is less likely. Did recommend to stop requip/gabapentin for RLS which resolved hallucinations. Follows with Butler Denmark, NP as needed.   Discussing right knee replacement with Dr. Wynelle Link, planning replacement in August 2023.   She has OSA on CPAP, endorses 100% compliance and feels this has helped RLS. Typically wears around 7-8 hours each night.  Has GERD and well managed on famotidine BID.   BMI is There is no height or weight on file to calculate BMI., she has not been working on diet and exercise, water aerobics was causing knee pain, stopped and pending surgery.  Wt Readings from Last 3 Encounters:  12/12/21 189 lb 9.6 oz (86 kg)  09/25/21 182 lb (82.6 kg)  09/12/21 186 lb 12.8 oz (84.7 kg)   She does not check BP at home, today their BP is   She does workout. She denies chest pain, dizziness.   She was having exertional dyspnea/chest pain, saw cardiologist Dr. Harriet Masson 08/2021, had CT coronary 09/20/21 with CAD RADS 0, ECHO 09/13/2021 showed EF 55-60% but did show MV with mild posterior leaflet prolapse, mild thickening of leaflets, moderate mitral annular calcification, planning for 6-9 month ECHO follow up.   She is on cholesterol medication (on zetia 10 mg, off of fenofibrate and started on vascepa, cardiology is planning recheck) and denies myalgias. Her cholesterol is not at goal. The cholesterol last visit was:   Lab Results  Component Value Date   CHOL 197 10/15/2021   HDL 48 10/15/2021   LDLCALC 109 (H) 10/15/2021   TRIG 233 (H)  10/15/2021   CHOLHDL 4.1 10/15/2021   She has had prediabetes predating 2014. She has not been working on diet and exercise for prediabetes, and denies increased appetite, nausea, paresthesia of the feet, polydipsia, polyuria, visual disturbances, vomiting and weight loss. Last A1C in the office was:  Lab Results  Component Value Date   HGBA1C 6.0 (H) 12/12/2021   She has stable CKD IIIa monitored at this office. Last GFR: Lab Results  Component Value Date   EGFR 66 12/12/2021   EGFR 60 09/12/2021   EGFR 48 (L) 08/08/2021   Patient is on Vitamin D supplement and was at goal at last check:    Lab Results  Component Value Date   VD25OH 55 08/08/2021     Taking B12 SL 2500 mcg irregularly. She reports fatigue and requests recheck of B12 today -  Lab Results  Component Value Date   SHFWYOVZ85 8,850 12/12/2021     Medication Review: Current Outpatient Medications on File Prior to Visit  Medication Sig Dispense Refill   acetaminophen (TYLENOL) 650 MG CR tablet Take 650 mg by mouth every 8 (eight) hours as needed for pain.     ARIPiprazole (ABILIFY) 10 MG tablet Take 1 tablet (10 mg total) by mouth daily. (Patient taking differently: Take 5 mg by mouth daily.) 30 tablet 3   buPROPion (WELLBUTRIN XL) 300 MG 24 hr tablet Take 1 tablet  Daily  for Mood, Focus  &  Concentration 90 tablet 3   cholecalciferol (VITAMIN D3) 25 MCG (1000 UNIT) tablet Take 4,000 Units by mouth daily.     Coenzyme Q10 (  CO Q 10 PO) Take by mouth in the morning and at bedtime.     COVID-19 mRNA bivalent vaccine, Pfizer, (PFIZER COVID-19 VAC BIVALENT) injection Inject into the muscle. 0.3 mL 0   Cyanocobalamin 2500 MCG SUBL Place 1 tablet under the tongue daily.     ezetimibe (ZETIA) 10 MG tablet Take  1 tablet  Daily  for Cholesterol 90 tablet 3   famotidine (PEPCID) 20 MG tablet Take  1 tablet  2 x /day  to Prevent Heartburn & Indigestion (Patient taking differently: 20 mg 2 (two) times daily as needed for  heartburn or indigestion.) 180 tablet 3   FLUoxetine (PROZAC) 10 MG capsule Take 20 mg by mouth daily.     Icosapent Ethyl (VASCEPA) 0.5 g CAPS Take 1 capsule by mouth 2 (two) times daily. 180 capsule 3   lamoTRIgine (LAMICTAL) 100 MG tablet Take  1 tablet  Daily  to Stabilize Mood 90 tablet 3   omeprazole (PRILOSEC) 20 MG capsule Take  1 capsule  2 x /day  to Prevent Indigestion & Heartburn (Patient taking differently: as needed (Prevent Indigestion and headburn). Take  1 capsule  2 x /day  to Prevent Indigestion & Heartburn) 180 capsule 3   ondansetron (ZOFRAN-ODT) 8 MG disintegrating tablet DISSOLVE 1 TABLET ON THE  TONGUE EVERY 8 HOURS AS  NEEDED FOR NAUSEA AND  VOMITING 60 tablet 0   SUCRALFATE PO Take 1 g by mouth as needed.     SUMAtriptan (IMITREX) 25 MG tablet Take 1-2 tab with onset of migraine. May repeat in 2 hours if headache persists. Max 4 tabs in 24 hours. 30 tablet 0   No current facility-administered medications on file prior to visit.    Allergies  Allergen Reactions   Erythromycin Other (See Comments)    TBS   Iodinated Contrast Media Other (See Comments)    Hot flashes, itching scalp Hot flashes, itching scalp   Penicillins Hives   Clarithromycin Other (See Comments)    Itching ankles   Brintellix [Vortioxetine]     Itching   Cholestatin     Current Problems (verified) Patient Active Problem List   Diagnosis Date Noted   Nonrheumatic mitral valve regurgitation 09/25/2021   Prediabetes 09/25/2021   Hypertriglyceridemia 09/25/2021   Stage 3a chronic kidney disease ([place]) 09/25/2021   Seasonal allergies 02/12/2021   B12 deficiency 04/12/2020   Gait abnormality 12/26/2019   RLS (restless legs syndrome) 12/26/2019   Memory changes 08/04/2019   Osteopenia 04/06/2018   Fatty liver 08/29/2017   Circadian rhythm sleep disorder 03/25/2017   Periodic limb movement disorder (PLMD) 03/25/2017   Obesity (BMI 30.0-34.9) 06/16/2015   Vitamin D deficiency 11/09/2014    GERD  11/09/2014   Major depression, recurrent, chronic ([place]) 06/07/2014   OSA on CPAP    Mixed hyperlipidemia 06/29/2013   ADD (attention deficit disorder) 06/29/2013    Screening Tests Immunization History  Administered Date(s) Administered   Influenza, High Dose Seasonal PF 06/12/2016, 05/13/2017, 06/25/2020   Influenza-Unspecified 06/02/2015, 05/19/2018, 05/19/2019   PFIZER(Purple Top)SARS-COV-2 Vaccination 09/12/2019, 10/03/2019, 04/30/2020, 12/08/2020   PPD Test 08/19/2011, 08/30/2013, 11/09/2014   Pfizer Covid-19 Vaccine Bivalent Booster 101yr & up 05/08/2021   Pneumococcal Conjugate-13 08/18/2009, 02/18/2019   Pneumococcal Polysaccharide-23 06/12/2016   Td 08/19/2003   Tdap 08/30/2013   Zoster Recombinat (Shingrix) 10/07/2021, 12/09/2021   Zoster, Live 08/18/2010   Covid 19: 3/3, 2021, pfizer  PAP: 2019, s/p TAH, done  MGM: 12/2020 R breast likely cyst -1 year follow  up, has seen surgeon, did not recommend surgery, will call to schedule  DEXA: 08/2020 - L fem T -2.3     Colonoscopy: 09/2011 Dr Hilarie Fredrickson, 10 year, EGD: 2019  Names of Other Physician/Practitioners you currently use: 1. [facility] here for primary care 2. Dr. Idolina Primer, eye doctor, last visit 2022 3. Dr. Tama Gander, dentist, last visit 11/2021  Patient Care Team: Unk Pinto, MD as PCP - General (Internal Medicine) Berniece Salines, DO as PCP - Cardiology (Cardiology) Clarene Essex, MD as Consulting Physician (Gastroenterology) Royston Sinner Colin Benton, MD as Consulting Physician (Obstetrics and Gynecology)  SURGICAL HISTORY She  has a past surgical history that includes perforated eardrum; Nasal sinus surgery; Tonsillectomy and adenoidectomy; Breast biopsy (Right, 02/27/2014); Cholecystectomy (11/03/2017); Laparoscopic hysterectomy (12/2017); Ureteroscopy; ORIF ankle fracture bimalleolar (Left, 03/14/2019); and Abdominal hysterectomy. FAMILY HISTORY Her family history includes  Anxiety disorder in her brother, father, and sister; Bipolar disorder in her brother; Breast cancer in her cousin; Breast cancer (age of onset: 69) in her maternal aunt; Breast cancer (age of onset: 93) in her maternal grandmother; Colon cancer (age of onset: 25) in her paternal grandmother; Dementia in her father; Depression in her sister; Diabetes in her mother and sister; Heart failure in her father; Heart failure (age of onset: 26) in her maternal grandfather; Mental illness in her paternal grandmother; Mood Disorder in her father; Osteoporosis in her brother; Stomach cancer in her maternal aunt. SOCIAL HISTORY She  reports that she has never smoked. She has never used smokeless tobacco. She reports that she does not currently use alcohol. She reports that she does not use drugs.  MEDICARE WELLNESS OBJECTIVES: Physical activity:   Cardiac risk factors:   Depression/mood screen:      12/12/2021    4:46 PM  Depression screen PHQ 2/9  Decreased Interest 3  Down, Depressed, Hopeless 3  PHQ - 2 Score 6  Altered sleeping 3  Tired, decreased energy 3  Change in appetite 3  Feeling bad or failure about yourself  0  Trouble concentrating 3  Moving slowly or fidgety/restless 2  Suicidal thoughts 0  PHQ-9 Score 20  Difficult doing work/chores Extremely dIfficult    ADLs:     12/12/2021    4:43 PM  In your present state of health, do you have any difficulty performing the following activities:  Hearing? 0  Vision? 0  Difficulty concentrating or making decisions? 0  Walking or climbing stairs? 0  Dressing or bathing? 0  Doing errands, shopping? 0     Cognitive Testing  Alert? Yes  Normal Appearance?Yes  Oriented to person? Yes  Place? Yes   Time? Yes  Recall of three objects?  Yes  Can perform simple calculations? Yes  Displays appropriate judgment?Yes  Can read the correct time from a watch face?Yes  EOL planning:       Review of Systems  Constitutional:  Negative for  malaise/fatigue and weight loss.  HENT:  Negative for hearing loss and tinnitus.   Eyes:  Negative for blurred vision and double vision.  Respiratory:  Negative for cough, sputum production, shortness of breath and wheezing.   Cardiovascular:  Negative for chest pain, palpitations, orthopnea, claudication, leg swelling and PND.  Gastrointestinal:  Negative for abdominal pain, blood in stool, constipation, diarrhea, heartburn (improved, avoiding triggers), melena, nausea and vomiting.  Genitourinary:  Negative for dysuria, flank pain, frequency, hematuria and urgency.  Musculoskeletal:  Positive for joint pain (R knee, pain mild, some intermittent  instability). Negative for falls and myalgias.  Skin:  Negative for rash.  Neurological:  Negative for dizziness, tingling, sensory change, weakness and headaches.  Endo/Heme/Allergies:  Negative for polydipsia.  Psychiatric/Behavioral:  Positive for depression and memory loss (stable/unchanged). Negative for hallucinations (improved off of requip), substance abuse and suicidal ideas. The patient is not nervous/anxious and does not have insomnia.   All other systems reviewed and are negative.    Objective:     There were no vitals filed for this visit.  There is no height or weight on file to calculate BMI.  General appearance: alert, no distress, WD/WN, female HEENT: normocephalic, sclerae anicteric, TMs pearly, nares patent, no discharge or erythema, pharynx normal Oral cavity: MMM, no lesions Neck: supple, no lymphadenopathy, no thyromegaly, no masses Heart: RRR, normal S1, S2, no murmurs Lungs: CTA bilaterally, no wheezes, rhonchi, or rales Abdomen: +bs, soft, non tender, non distended, no masses, no hepatomegaly, no splenomegaly Musculoskeletal: nontender, no swelling, no obvious deformity, gait is slow and antalgic Extremities: no edema, no cyanosis, no clubbing Pulses: 2+ symmetric, upper and lower extremities, normal cap  refill Neurological: alert, oriented x 3, CN2-12 intact, strength normal upper extremities and lower extremities, sensation normal throughout, DTRs 2+ throughout, no cerebellar signs, gait slow and antalgic  Psychiatric: Depressed affect, behavior normal, pleasant   Medicare Attestation I have personally reviewed: The patient's medical and social history Their use of alcohol, tobacco or illicit drugs Their current medications and supplements The patient's functional ability including ADLs,fall risks, home safety risks, cognitive, and hearing and visual impairment Diet and physical activities Evidence for depression or mood disorders  The patient's weight, height, BMI, and visual acuity have been recorded in the chart.  I have made referrals, counseling, and provided education to the patient based on review of the above and I have provided the patient with a written personalized care plan for preventive services.      Darrol Jump, NP   04/18/2022

## 2022-04-18 NOTE — Patient Instructions (Signed)

## 2022-04-19 LAB — COMPLETE METABOLIC PANEL WITH GFR
AG Ratio: 2 (calc) (ref 1.0–2.5)
ALT: 33 U/L — ABNORMAL HIGH (ref 6–29)
AST: 41 U/L — ABNORMAL HIGH (ref 10–35)
Albumin: 4.4 g/dL (ref 3.6–5.1)
Alkaline phosphatase (APISO): 69 U/L (ref 37–153)
BUN: 19 mg/dL (ref 7–25)
CO2: 24 mmol/L (ref 20–32)
Calcium: 9 mg/dL (ref 8.6–10.4)
Chloride: 108 mmol/L (ref 98–110)
Creat: 0.94 mg/dL (ref 0.60–1.00)
Globulin: 2.2 g/dL (calc) (ref 1.9–3.7)
Glucose, Bld: 70 mg/dL (ref 65–99)
Potassium: 4.6 mmol/L (ref 3.5–5.3)
Sodium: 140 mmol/L (ref 135–146)
Total Bilirubin: 0.4 mg/dL (ref 0.2–1.2)
Total Protein: 6.6 g/dL (ref 6.1–8.1)
eGFR: 64 mL/min/{1.73_m2} (ref >=60)

## 2022-04-19 LAB — CBC WITH DIFFERENTIAL/PLATELET
Absolute Monocytes: 666 cells/uL (ref 200–950)
Basophils Absolute: 62 cells/uL (ref 0–200)
Basophils Relative: 1.1 %
Eosinophils Absolute: 78 cells/uL (ref 15–500)
Eosinophils Relative: 1.4 %
HCT: 39.5 % (ref 35.0–45.0)
Hemoglobin: 13.1 g/dL (ref 11.7–15.5)
Lymphs Abs: 1579 cells/uL (ref 850–3900)
MCH: 28.4 pg (ref 27.0–33.0)
MCHC: 33.2 g/dL (ref 32.0–36.0)
MCV: 85.7 fL (ref 80.0–100.0)
MPV: 10 fL (ref 7.5–12.5)
Monocytes Relative: 11.9 %
Neutro Abs: 3214 cells/uL (ref 1500–7800)
Neutrophils Relative %: 57.4 %
Platelets: 278 10*3/uL (ref 140–400)
RBC: 4.61 10*6/uL (ref 3.80–5.10)
RDW: 13.1 % (ref 11.0–15.0)
Total Lymphocyte: 28.2 %
WBC: 5.6 10*3/uL (ref 3.8–10.8)

## 2022-04-19 LAB — HEMOGLOBIN A1C
Hgb A1c MFr Bld: 5.9 % of total Hgb — ABNORMAL HIGH (ref <5.7)
Mean Plasma Glucose: 123 mg/dL
eAG (mmol/L): 6.8 mmol/L

## 2022-04-19 LAB — LIPID PANEL
Cholesterol: 184 mg/dL (ref <200)
HDL: 45 mg/dL — ABNORMAL LOW (ref >=50)
LDL Cholesterol (Calc): 96 mg/dL (calc)
Non-HDL Cholesterol (Calc): 139 mg/dL (calc) — ABNORMAL HIGH (ref <130)
Total CHOL/HDL Ratio: 4.1 (calc) (ref <5.0)
Triglycerides: 319 mg/dL — ABNORMAL HIGH (ref <150)

## 2022-04-19 LAB — URINALYSIS, ROUTINE W REFLEX MICROSCOPIC
Bilirubin Urine: NEGATIVE
Glucose, UA: NEGATIVE
Hgb urine dipstick: NEGATIVE
Ketones, ur: NEGATIVE
Leukocytes,Ua: NEGATIVE
Nitrite: NEGATIVE
Protein, ur: NEGATIVE
Specific Gravity, Urine: 1.018 (ref 1.001–1.035)
pH: 5.5 (ref 5.0–8.0)

## 2022-04-19 LAB — MAGNESIUM: Magnesium: 1.9 mg/dL (ref 1.5–2.5)

## 2022-04-19 LAB — VITAMIN D 25 HYDROXY (VIT D DEFICIENCY, FRACTURES): Vit D, 25-Hydroxy: 48 ng/mL (ref 30–100)

## 2022-04-19 LAB — TSH: TSH: 1.78 mIU/L (ref 0.40–4.50)

## 2022-04-25 DIAGNOSIS — D1621 Benign neoplasm of long bones of right lower limb: Secondary | ICD-10-CM | POA: Diagnosis not present

## 2022-04-25 DIAGNOSIS — M1711 Unilateral primary osteoarthritis, right knee: Secondary | ICD-10-CM | POA: Diagnosis not present

## 2022-04-25 DIAGNOSIS — S81811D Laceration without foreign body, right lower leg, subsequent encounter: Secondary | ICD-10-CM | POA: Diagnosis not present

## 2022-04-29 ENCOUNTER — Telehealth: Payer: Self-pay

## 2022-04-29 DIAGNOSIS — Z8619 Personal history of other infectious and parasitic diseases: Secondary | ICD-10-CM | POA: Diagnosis not present

## 2022-04-29 DIAGNOSIS — N189 Chronic kidney disease, unspecified: Secondary | ICD-10-CM | POA: Diagnosis not present

## 2022-04-29 DIAGNOSIS — Z91041 Radiographic dye allergy status: Secondary | ICD-10-CM | POA: Diagnosis not present

## 2022-04-29 DIAGNOSIS — Z79899 Other long term (current) drug therapy: Secondary | ICD-10-CM | POA: Diagnosis not present

## 2022-04-29 DIAGNOSIS — M1711 Unilateral primary osteoarthritis, right knee: Secondary | ICD-10-CM | POA: Diagnosis not present

## 2022-04-29 DIAGNOSIS — Z881 Allergy status to other antibiotic agents status: Secondary | ICD-10-CM | POA: Diagnosis not present

## 2022-04-29 DIAGNOSIS — M1990 Unspecified osteoarthritis, unspecified site: Secondary | ICD-10-CM | POA: Diagnosis not present

## 2022-04-29 DIAGNOSIS — K760 Fatty (change of) liver, not elsewhere classified: Secondary | ICD-10-CM | POA: Diagnosis not present

## 2022-04-29 DIAGNOSIS — G4730 Sleep apnea, unspecified: Secondary | ICD-10-CM | POA: Diagnosis not present

## 2022-04-29 DIAGNOSIS — R7303 Prediabetes: Secondary | ICD-10-CM | POA: Diagnosis not present

## 2022-04-29 DIAGNOSIS — R531 Weakness: Secondary | ICD-10-CM | POA: Diagnosis not present

## 2022-04-29 DIAGNOSIS — M8580 Other specified disorders of bone density and structure, unspecified site: Secondary | ICD-10-CM | POA: Diagnosis not present

## 2022-04-29 DIAGNOSIS — Z471 Aftercare following joint replacement surgery: Secondary | ICD-10-CM | POA: Diagnosis not present

## 2022-04-29 DIAGNOSIS — Z9181 History of falling: Secondary | ICD-10-CM | POA: Diagnosis not present

## 2022-04-29 DIAGNOSIS — D1621 Benign neoplasm of long bones of right lower limb: Secondary | ICD-10-CM | POA: Diagnosis not present

## 2022-04-29 DIAGNOSIS — E8889 Other specified metabolic disorders: Secondary | ICD-10-CM | POA: Diagnosis not present

## 2022-04-29 DIAGNOSIS — Z96651 Presence of right artificial knee joint: Secondary | ICD-10-CM | POA: Diagnosis not present

## 2022-04-29 DIAGNOSIS — R2689 Other abnormalities of gait and mobility: Secondary | ICD-10-CM | POA: Diagnosis not present

## 2022-04-29 DIAGNOSIS — I129 Hypertensive chronic kidney disease with stage 1 through stage 4 chronic kidney disease, or unspecified chronic kidney disease: Secondary | ICD-10-CM | POA: Diagnosis not present

## 2022-04-29 DIAGNOSIS — G2581 Restless legs syndrome: Secondary | ICD-10-CM | POA: Diagnosis not present

## 2022-04-29 DIAGNOSIS — G4733 Obstructive sleep apnea (adult) (pediatric): Secondary | ICD-10-CM | POA: Diagnosis not present

## 2022-04-29 DIAGNOSIS — S81811D Laceration without foreign body, right lower leg, subsequent encounter: Secondary | ICD-10-CM | POA: Diagnosis not present

## 2022-04-29 DIAGNOSIS — K219 Gastro-esophageal reflux disease without esophagitis: Secondary | ICD-10-CM | POA: Diagnosis not present

## 2022-04-29 NOTE — Telephone Encounter (Signed)
Prior auth for [employer]ur[employer] denied. Denied for not meeting the prior authorization requirements.

## 2022-04-30 DIAGNOSIS — K219 Gastro-esophageal reflux disease without esophagitis: Secondary | ICD-10-CM | POA: Diagnosis not present

## 2022-04-30 DIAGNOSIS — R2689 Other abnormalities of gait and mobility: Secondary | ICD-10-CM | POA: Diagnosis not present

## 2022-04-30 DIAGNOSIS — G2581 Restless legs syndrome: Secondary | ICD-10-CM | POA: Diagnosis not present

## 2022-04-30 DIAGNOSIS — K760 Fatty (change of) liver, not elsewhere classified: Secondary | ICD-10-CM | POA: Diagnosis not present

## 2022-04-30 DIAGNOSIS — Z8619 Personal history of other infectious and parasitic diseases: Secondary | ICD-10-CM | POA: Diagnosis not present

## 2022-04-30 DIAGNOSIS — M8580 Other specified disorders of bone density and structure, unspecified site: Secondary | ICD-10-CM | POA: Diagnosis not present

## 2022-04-30 DIAGNOSIS — G4733 Obstructive sleep apnea (adult) (pediatric): Secondary | ICD-10-CM | POA: Diagnosis not present

## 2022-04-30 DIAGNOSIS — S81811D Laceration without foreign body, right lower leg, subsequent encounter: Secondary | ICD-10-CM | POA: Diagnosis not present

## 2022-04-30 DIAGNOSIS — M1711 Unilateral primary osteoarthritis, right knee: Secondary | ICD-10-CM | POA: Diagnosis not present

## 2022-04-30 DIAGNOSIS — Z881 Allergy status to other antibiotic agents status: Secondary | ICD-10-CM | POA: Diagnosis not present

## 2022-04-30 DIAGNOSIS — D1621 Benign neoplasm of long bones of right lower limb: Secondary | ICD-10-CM | POA: Diagnosis not present

## 2022-04-30 DIAGNOSIS — Z79899 Other long term (current) drug therapy: Secondary | ICD-10-CM | POA: Diagnosis not present

## 2022-04-30 DIAGNOSIS — N189 Chronic kidney disease, unspecified: Secondary | ICD-10-CM | POA: Diagnosis not present

## 2022-04-30 DIAGNOSIS — R7303 Prediabetes: Secondary | ICD-10-CM | POA: Diagnosis not present

## 2022-04-30 DIAGNOSIS — R531 Weakness: Secondary | ICD-10-CM | POA: Diagnosis not present

## 2022-04-30 DIAGNOSIS — E8889 Other specified metabolic disorders: Secondary | ICD-10-CM | POA: Diagnosis not present

## 2022-04-30 DIAGNOSIS — Z9181 History of falling: Secondary | ICD-10-CM | POA: Diagnosis not present

## 2022-04-30 DIAGNOSIS — Z91041 Radiographic dye allergy status: Secondary | ICD-10-CM | POA: Diagnosis not present

## 2022-04-30 DIAGNOSIS — M1990 Unspecified osteoarthritis, unspecified site: Secondary | ICD-10-CM | POA: Diagnosis not present

## 2022-04-30 DIAGNOSIS — I129 Hypertensive chronic kidney disease with stage 1 through stage 4 chronic kidney disease, or unspecified chronic kidney disease: Secondary | ICD-10-CM | POA: Diagnosis not present

## 2022-05-01 DIAGNOSIS — M8580 Other specified disorders of bone density and structure, unspecified site: Secondary | ICD-10-CM | POA: Diagnosis not present

## 2022-05-01 DIAGNOSIS — I129 Hypertensive chronic kidney disease with stage 1 through stage 4 chronic kidney disease, or unspecified chronic kidney disease: Secondary | ICD-10-CM | POA: Diagnosis not present

## 2022-05-01 DIAGNOSIS — K760 Fatty (change of) liver, not elsewhere classified: Secondary | ICD-10-CM | POA: Diagnosis not present

## 2022-05-01 DIAGNOSIS — Z8619 Personal history of other infectious and parasitic diseases: Secondary | ICD-10-CM | POA: Diagnosis not present

## 2022-05-01 DIAGNOSIS — Z9181 History of falling: Secondary | ICD-10-CM | POA: Diagnosis not present

## 2022-05-01 DIAGNOSIS — F32A Depression, unspecified: Secondary | ICD-10-CM | POA: Diagnosis not present

## 2022-05-01 DIAGNOSIS — S81811D Laceration without foreign body, right lower leg, subsequent encounter: Secondary | ICD-10-CM | POA: Diagnosis not present

## 2022-05-01 DIAGNOSIS — G4733 Obstructive sleep apnea (adult) (pediatric): Secondary | ICD-10-CM | POA: Diagnosis not present

## 2022-05-01 DIAGNOSIS — R531 Weakness: Secondary | ICD-10-CM | POA: Diagnosis not present

## 2022-05-01 DIAGNOSIS — M1990 Unspecified osteoarthritis, unspecified site: Secondary | ICD-10-CM | POA: Diagnosis not present

## 2022-05-01 DIAGNOSIS — R2689 Other abnormalities of gait and mobility: Secondary | ICD-10-CM | POA: Diagnosis not present

## 2022-05-01 DIAGNOSIS — M1711 Unilateral primary osteoarthritis, right knee: Secondary | ICD-10-CM | POA: Diagnosis not present

## 2022-05-01 DIAGNOSIS — Z91041 Radiographic dye allergy status: Secondary | ICD-10-CM | POA: Diagnosis not present

## 2022-05-01 DIAGNOSIS — G2581 Restless legs syndrome: Secondary | ICD-10-CM | POA: Diagnosis not present

## 2022-05-01 DIAGNOSIS — D1621 Benign neoplasm of long bones of right lower limb: Secondary | ICD-10-CM | POA: Diagnosis not present

## 2022-05-01 DIAGNOSIS — R0602 Shortness of breath: Secondary | ICD-10-CM | POA: Diagnosis not present

## 2022-05-01 DIAGNOSIS — Z881 Allergy status to other antibiotic agents status: Secondary | ICD-10-CM | POA: Diagnosis not present

## 2022-05-01 DIAGNOSIS — Z9989 Dependence on other enabling machines and devices: Secondary | ICD-10-CM | POA: Diagnosis not present

## 2022-05-01 DIAGNOSIS — E8889 Other specified metabolic disorders: Secondary | ICD-10-CM | POA: Diagnosis not present

## 2022-05-01 DIAGNOSIS — Z79899 Other long term (current) drug therapy: Secondary | ICD-10-CM | POA: Diagnosis not present

## 2022-05-01 DIAGNOSIS — R7303 Prediabetes: Secondary | ICD-10-CM | POA: Diagnosis not present

## 2022-05-01 DIAGNOSIS — K219 Gastro-esophageal reflux disease without esophagitis: Secondary | ICD-10-CM | POA: Diagnosis not present

## 2022-05-01 DIAGNOSIS — N189 Chronic kidney disease, unspecified: Secondary | ICD-10-CM | POA: Diagnosis not present

## 2022-05-02 DIAGNOSIS — G2581 Restless legs syndrome: Secondary | ICD-10-CM | POA: Diagnosis not present

## 2022-05-02 DIAGNOSIS — S81811D Laceration without foreign body, right lower leg, subsequent encounter: Secondary | ICD-10-CM | POA: Diagnosis not present

## 2022-05-02 DIAGNOSIS — M1711 Unilateral primary osteoarthritis, right knee: Secondary | ICD-10-CM | POA: Diagnosis not present

## 2022-05-02 DIAGNOSIS — M8580 Other specified disorders of bone density and structure, unspecified site: Secondary | ICD-10-CM | POA: Diagnosis not present

## 2022-05-02 DIAGNOSIS — K760 Fatty (change of) liver, not elsewhere classified: Secondary | ICD-10-CM | POA: Diagnosis not present

## 2022-05-02 DIAGNOSIS — N189 Chronic kidney disease, unspecified: Secondary | ICD-10-CM | POA: Diagnosis not present

## 2022-05-02 DIAGNOSIS — M1990 Unspecified osteoarthritis, unspecified site: Secondary | ICD-10-CM | POA: Diagnosis not present

## 2022-05-02 DIAGNOSIS — Z8619 Personal history of other infectious and parasitic diseases: Secondary | ICD-10-CM | POA: Diagnosis not present

## 2022-05-02 DIAGNOSIS — I129 Hypertensive chronic kidney disease with stage 1 through stage 4 chronic kidney disease, or unspecified chronic kidney disease: Secondary | ICD-10-CM | POA: Diagnosis not present

## 2022-05-02 DIAGNOSIS — Z79899 Other long term (current) drug therapy: Secondary | ICD-10-CM | POA: Diagnosis not present

## 2022-05-02 DIAGNOSIS — Z881 Allergy status to other antibiotic agents status: Secondary | ICD-10-CM | POA: Diagnosis not present

## 2022-05-02 DIAGNOSIS — R2689 Other abnormalities of gait and mobility: Secondary | ICD-10-CM | POA: Diagnosis not present

## 2022-05-02 DIAGNOSIS — E8889 Other specified metabolic disorders: Secondary | ICD-10-CM | POA: Diagnosis not present

## 2022-05-02 DIAGNOSIS — Z9181 History of falling: Secondary | ICD-10-CM | POA: Diagnosis not present

## 2022-05-02 DIAGNOSIS — D1621 Benign neoplasm of long bones of right lower limb: Secondary | ICD-10-CM | POA: Diagnosis not present

## 2022-05-02 DIAGNOSIS — K219 Gastro-esophageal reflux disease without esophagitis: Secondary | ICD-10-CM | POA: Diagnosis not present

## 2022-05-02 DIAGNOSIS — R7303 Prediabetes: Secondary | ICD-10-CM | POA: Diagnosis not present

## 2022-05-02 DIAGNOSIS — G4733 Obstructive sleep apnea (adult) (pediatric): Secondary | ICD-10-CM | POA: Diagnosis not present

## 2022-05-02 DIAGNOSIS — R531 Weakness: Secondary | ICD-10-CM | POA: Diagnosis not present

## 2022-05-02 DIAGNOSIS — Z91041 Radiographic dye allergy status: Secondary | ICD-10-CM | POA: Diagnosis not present

## 2022-05-03 DIAGNOSIS — Z9049 Acquired absence of other specified parts of digestive tract: Secondary | ICD-10-CM | POA: Diagnosis not present

## 2022-05-03 DIAGNOSIS — Z9181 History of falling: Secondary | ICD-10-CM | POA: Diagnosis not present

## 2022-05-03 DIAGNOSIS — G43909 Migraine, unspecified, not intractable, without status migrainosus: Secondary | ICD-10-CM | POA: Diagnosis not present

## 2022-05-03 DIAGNOSIS — G2581 Restless legs syndrome: Secondary | ICD-10-CM | POA: Diagnosis not present

## 2022-05-03 DIAGNOSIS — K219 Gastro-esophageal reflux disease without esophagitis: Secondary | ICD-10-CM | POA: Diagnosis not present

## 2022-05-03 DIAGNOSIS — G4733 Obstructive sleep apnea (adult) (pediatric): Secondary | ICD-10-CM | POA: Diagnosis not present

## 2022-05-03 DIAGNOSIS — K769 Liver disease, unspecified: Secondary | ICD-10-CM | POA: Diagnosis not present

## 2022-05-03 DIAGNOSIS — R7303 Prediabetes: Secondary | ICD-10-CM | POA: Diagnosis not present

## 2022-05-03 DIAGNOSIS — Z96651 Presence of right artificial knee joint: Secondary | ICD-10-CM | POA: Diagnosis not present

## 2022-05-03 DIAGNOSIS — Z471 Aftercare following joint replacement surgery: Secondary | ICD-10-CM | POA: Diagnosis not present

## 2022-05-03 DIAGNOSIS — E782 Mixed hyperlipidemia: Secondary | ICD-10-CM | POA: Diagnosis not present

## 2022-05-03 DIAGNOSIS — I051 Rheumatic mitral insufficiency: Secondary | ICD-10-CM | POA: Diagnosis not present

## 2022-05-03 DIAGNOSIS — Z7982 Long term (current) use of aspirin: Secondary | ICD-10-CM | POA: Diagnosis not present

## 2022-05-03 DIAGNOSIS — N182 Chronic kidney disease, stage 2 (mild): Secondary | ICD-10-CM | POA: Diagnosis not present

## 2022-05-03 DIAGNOSIS — M8580 Other specified disorders of bone density and structure, unspecified site: Secondary | ICD-10-CM | POA: Diagnosis not present

## 2022-05-03 DIAGNOSIS — I129 Hypertensive chronic kidney disease with stage 1 through stage 4 chronic kidney disease, or unspecified chronic kidney disease: Secondary | ICD-10-CM | POA: Diagnosis not present

## 2022-05-06 DIAGNOSIS — E782 Mixed hyperlipidemia: Secondary | ICD-10-CM | POA: Diagnosis not present

## 2022-05-06 DIAGNOSIS — K219 Gastro-esophageal reflux disease without esophagitis: Secondary | ICD-10-CM | POA: Diagnosis not present

## 2022-05-06 DIAGNOSIS — M8580 Other specified disorders of bone density and structure, unspecified site: Secondary | ICD-10-CM | POA: Diagnosis not present

## 2022-05-06 DIAGNOSIS — K769 Liver disease, unspecified: Secondary | ICD-10-CM | POA: Diagnosis not present

## 2022-05-06 DIAGNOSIS — G43909 Migraine, unspecified, not intractable, without status migrainosus: Secondary | ICD-10-CM | POA: Diagnosis not present

## 2022-05-06 DIAGNOSIS — Z7982 Long term (current) use of aspirin: Secondary | ICD-10-CM | POA: Diagnosis not present

## 2022-05-06 DIAGNOSIS — Z471 Aftercare following joint replacement surgery: Secondary | ICD-10-CM | POA: Diagnosis not present

## 2022-05-06 DIAGNOSIS — Z9049 Acquired absence of other specified parts of digestive tract: Secondary | ICD-10-CM | POA: Diagnosis not present

## 2022-05-06 DIAGNOSIS — Z9181 History of falling: Secondary | ICD-10-CM | POA: Diagnosis not present

## 2022-05-06 DIAGNOSIS — R7303 Prediabetes: Secondary | ICD-10-CM | POA: Diagnosis not present

## 2022-05-06 DIAGNOSIS — I129 Hypertensive chronic kidney disease with stage 1 through stage 4 chronic kidney disease, or unspecified chronic kidney disease: Secondary | ICD-10-CM | POA: Diagnosis not present

## 2022-05-06 DIAGNOSIS — G4733 Obstructive sleep apnea (adult) (pediatric): Secondary | ICD-10-CM | POA: Diagnosis not present

## 2022-05-06 DIAGNOSIS — N182 Chronic kidney disease, stage 2 (mild): Secondary | ICD-10-CM | POA: Diagnosis not present

## 2022-05-06 DIAGNOSIS — Z96651 Presence of right artificial knee joint: Secondary | ICD-10-CM | POA: Diagnosis not present

## 2022-05-06 DIAGNOSIS — G2581 Restless legs syndrome: Secondary | ICD-10-CM | POA: Diagnosis not present

## 2022-05-06 DIAGNOSIS — I051 Rheumatic mitral insufficiency: Secondary | ICD-10-CM | POA: Diagnosis not present

## 2022-05-07 DIAGNOSIS — Z471 Aftercare following joint replacement surgery: Secondary | ICD-10-CM | POA: Diagnosis not present

## 2022-05-07 DIAGNOSIS — G2581 Restless legs syndrome: Secondary | ICD-10-CM | POA: Diagnosis not present

## 2022-05-07 DIAGNOSIS — Z9049 Acquired absence of other specified parts of digestive tract: Secondary | ICD-10-CM | POA: Diagnosis not present

## 2022-05-07 DIAGNOSIS — N182 Chronic kidney disease, stage 2 (mild): Secondary | ICD-10-CM | POA: Diagnosis not present

## 2022-05-07 DIAGNOSIS — E782 Mixed hyperlipidemia: Secondary | ICD-10-CM | POA: Diagnosis not present

## 2022-05-07 DIAGNOSIS — I129 Hypertensive chronic kidney disease with stage 1 through stage 4 chronic kidney disease, or unspecified chronic kidney disease: Secondary | ICD-10-CM | POA: Diagnosis not present

## 2022-05-07 DIAGNOSIS — I051 Rheumatic mitral insufficiency: Secondary | ICD-10-CM | POA: Diagnosis not present

## 2022-05-07 DIAGNOSIS — R7303 Prediabetes: Secondary | ICD-10-CM | POA: Diagnosis not present

## 2022-05-07 DIAGNOSIS — K769 Liver disease, unspecified: Secondary | ICD-10-CM | POA: Diagnosis not present

## 2022-05-07 DIAGNOSIS — G43909 Migraine, unspecified, not intractable, without status migrainosus: Secondary | ICD-10-CM | POA: Diagnosis not present

## 2022-05-07 DIAGNOSIS — Z7982 Long term (current) use of aspirin: Secondary | ICD-10-CM | POA: Diagnosis not present

## 2022-05-07 DIAGNOSIS — M8580 Other specified disorders of bone density and structure, unspecified site: Secondary | ICD-10-CM | POA: Diagnosis not present

## 2022-05-07 DIAGNOSIS — Z96651 Presence of right artificial knee joint: Secondary | ICD-10-CM | POA: Diagnosis not present

## 2022-05-07 DIAGNOSIS — K219 Gastro-esophageal reflux disease without esophagitis: Secondary | ICD-10-CM | POA: Diagnosis not present

## 2022-05-07 DIAGNOSIS — G4733 Obstructive sleep apnea (adult) (pediatric): Secondary | ICD-10-CM | POA: Diagnosis not present

## 2022-05-07 DIAGNOSIS — Z9181 History of falling: Secondary | ICD-10-CM | POA: Diagnosis not present

## 2022-05-09 DIAGNOSIS — G4733 Obstructive sleep apnea (adult) (pediatric): Secondary | ICD-10-CM | POA: Diagnosis not present

## 2022-05-09 DIAGNOSIS — Z471 Aftercare following joint replacement surgery: Secondary | ICD-10-CM | POA: Diagnosis not present

## 2022-05-09 DIAGNOSIS — G43909 Migraine, unspecified, not intractable, without status migrainosus: Secondary | ICD-10-CM | POA: Diagnosis not present

## 2022-05-09 DIAGNOSIS — Z9181 History of falling: Secondary | ICD-10-CM | POA: Diagnosis not present

## 2022-05-09 DIAGNOSIS — R7303 Prediabetes: Secondary | ICD-10-CM | POA: Diagnosis not present

## 2022-05-09 DIAGNOSIS — G2581 Restless legs syndrome: Secondary | ICD-10-CM | POA: Diagnosis not present

## 2022-05-09 DIAGNOSIS — K219 Gastro-esophageal reflux disease without esophagitis: Secondary | ICD-10-CM | POA: Diagnosis not present

## 2022-05-09 DIAGNOSIS — I051 Rheumatic mitral insufficiency: Secondary | ICD-10-CM | POA: Diagnosis not present

## 2022-05-09 DIAGNOSIS — Z9049 Acquired absence of other specified parts of digestive tract: Secondary | ICD-10-CM | POA: Diagnosis not present

## 2022-05-09 DIAGNOSIS — N182 Chronic kidney disease, stage 2 (mild): Secondary | ICD-10-CM | POA: Diagnosis not present

## 2022-05-09 DIAGNOSIS — Z96651 Presence of right artificial knee joint: Secondary | ICD-10-CM | POA: Diagnosis not present

## 2022-05-09 DIAGNOSIS — M8580 Other specified disorders of bone density and structure, unspecified site: Secondary | ICD-10-CM | POA: Diagnosis not present

## 2022-05-09 DIAGNOSIS — I129 Hypertensive chronic kidney disease with stage 1 through stage 4 chronic kidney disease, or unspecified chronic kidney disease: Secondary | ICD-10-CM | POA: Diagnosis not present

## 2022-05-09 DIAGNOSIS — K769 Liver disease, unspecified: Secondary | ICD-10-CM | POA: Diagnosis not present

## 2022-05-09 DIAGNOSIS — E782 Mixed hyperlipidemia: Secondary | ICD-10-CM | POA: Diagnosis not present

## 2022-05-09 DIAGNOSIS — Z7982 Long term (current) use of aspirin: Secondary | ICD-10-CM | POA: Diagnosis not present

## 2022-05-12 DIAGNOSIS — R7303 Prediabetes: Secondary | ICD-10-CM | POA: Diagnosis not present

## 2022-05-12 DIAGNOSIS — K219 Gastro-esophageal reflux disease without esophagitis: Secondary | ICD-10-CM | POA: Diagnosis not present

## 2022-05-12 DIAGNOSIS — G2581 Restless legs syndrome: Secondary | ICD-10-CM | POA: Diagnosis not present

## 2022-05-12 DIAGNOSIS — M8580 Other specified disorders of bone density and structure, unspecified site: Secondary | ICD-10-CM | POA: Diagnosis not present

## 2022-05-12 DIAGNOSIS — I129 Hypertensive chronic kidney disease with stage 1 through stage 4 chronic kidney disease, or unspecified chronic kidney disease: Secondary | ICD-10-CM | POA: Diagnosis not present

## 2022-05-12 DIAGNOSIS — K769 Liver disease, unspecified: Secondary | ICD-10-CM | POA: Diagnosis not present

## 2022-05-12 DIAGNOSIS — I051 Rheumatic mitral insufficiency: Secondary | ICD-10-CM | POA: Diagnosis not present

## 2022-05-12 DIAGNOSIS — Z7982 Long term (current) use of aspirin: Secondary | ICD-10-CM | POA: Diagnosis not present

## 2022-05-12 DIAGNOSIS — G4733 Obstructive sleep apnea (adult) (pediatric): Secondary | ICD-10-CM | POA: Diagnosis not present

## 2022-05-12 DIAGNOSIS — Z96651 Presence of right artificial knee joint: Secondary | ICD-10-CM | POA: Diagnosis not present

## 2022-05-12 DIAGNOSIS — E782 Mixed hyperlipidemia: Secondary | ICD-10-CM | POA: Diagnosis not present

## 2022-05-12 DIAGNOSIS — G43909 Migraine, unspecified, not intractable, without status migrainosus: Secondary | ICD-10-CM | POA: Diagnosis not present

## 2022-05-12 DIAGNOSIS — Z471 Aftercare following joint replacement surgery: Secondary | ICD-10-CM | POA: Diagnosis not present

## 2022-05-12 DIAGNOSIS — Z9049 Acquired absence of other specified parts of digestive tract: Secondary | ICD-10-CM | POA: Diagnosis not present

## 2022-05-12 DIAGNOSIS — N182 Chronic kidney disease, stage 2 (mild): Secondary | ICD-10-CM | POA: Diagnosis not present

## 2022-05-12 DIAGNOSIS — Z9181 History of falling: Secondary | ICD-10-CM | POA: Diagnosis not present

## 2022-05-14 DIAGNOSIS — I051 Rheumatic mitral insufficiency: Secondary | ICD-10-CM | POA: Diagnosis not present

## 2022-05-14 DIAGNOSIS — M8580 Other specified disorders of bone density and structure, unspecified site: Secondary | ICD-10-CM | POA: Diagnosis not present

## 2022-05-14 DIAGNOSIS — Z96651 Presence of right artificial knee joint: Secondary | ICD-10-CM | POA: Diagnosis not present

## 2022-05-14 DIAGNOSIS — Z9049 Acquired absence of other specified parts of digestive tract: Secondary | ICD-10-CM | POA: Diagnosis not present

## 2022-05-14 DIAGNOSIS — G4733 Obstructive sleep apnea (adult) (pediatric): Secondary | ICD-10-CM | POA: Diagnosis not present

## 2022-05-14 DIAGNOSIS — E782 Mixed hyperlipidemia: Secondary | ICD-10-CM | POA: Diagnosis not present

## 2022-05-14 DIAGNOSIS — G43909 Migraine, unspecified, not intractable, without status migrainosus: Secondary | ICD-10-CM | POA: Diagnosis not present

## 2022-05-14 DIAGNOSIS — K219 Gastro-esophageal reflux disease without esophagitis: Secondary | ICD-10-CM | POA: Diagnosis not present

## 2022-05-14 DIAGNOSIS — Z9181 History of falling: Secondary | ICD-10-CM | POA: Diagnosis not present

## 2022-05-14 DIAGNOSIS — N182 Chronic kidney disease, stage 2 (mild): Secondary | ICD-10-CM | POA: Diagnosis not present

## 2022-05-14 DIAGNOSIS — G2581 Restless legs syndrome: Secondary | ICD-10-CM | POA: Diagnosis not present

## 2022-05-14 DIAGNOSIS — Z7982 Long term (current) use of aspirin: Secondary | ICD-10-CM | POA: Diagnosis not present

## 2022-05-14 DIAGNOSIS — K769 Liver disease, unspecified: Secondary | ICD-10-CM | POA: Diagnosis not present

## 2022-05-14 DIAGNOSIS — I129 Hypertensive chronic kidney disease with stage 1 through stage 4 chronic kidney disease, or unspecified chronic kidney disease: Secondary | ICD-10-CM | POA: Diagnosis not present

## 2022-05-14 DIAGNOSIS — Z471 Aftercare following joint replacement surgery: Secondary | ICD-10-CM | POA: Diagnosis not present

## 2022-05-14 DIAGNOSIS — R7303 Prediabetes: Secondary | ICD-10-CM | POA: Diagnosis not present

## 2022-05-15 DIAGNOSIS — N182 Chronic kidney disease, stage 2 (mild): Secondary | ICD-10-CM | POA: Diagnosis not present

## 2022-05-15 DIAGNOSIS — I129 Hypertensive chronic kidney disease with stage 1 through stage 4 chronic kidney disease, or unspecified chronic kidney disease: Secondary | ICD-10-CM | POA: Diagnosis not present

## 2022-05-15 DIAGNOSIS — Z9049 Acquired absence of other specified parts of digestive tract: Secondary | ICD-10-CM | POA: Diagnosis not present

## 2022-05-15 DIAGNOSIS — Z7982 Long term (current) use of aspirin: Secondary | ICD-10-CM | POA: Diagnosis not present

## 2022-05-15 DIAGNOSIS — Z96651 Presence of right artificial knee joint: Secondary | ICD-10-CM | POA: Diagnosis not present

## 2022-05-15 DIAGNOSIS — G2581 Restless legs syndrome: Secondary | ICD-10-CM | POA: Diagnosis not present

## 2022-05-15 DIAGNOSIS — Z471 Aftercare following joint replacement surgery: Secondary | ICD-10-CM | POA: Diagnosis not present

## 2022-05-15 DIAGNOSIS — I051 Rheumatic mitral insufficiency: Secondary | ICD-10-CM | POA: Diagnosis not present

## 2022-05-15 DIAGNOSIS — Z9181 History of falling: Secondary | ICD-10-CM | POA: Diagnosis not present

## 2022-05-15 DIAGNOSIS — M8580 Other specified disorders of bone density and structure, unspecified site: Secondary | ICD-10-CM | POA: Diagnosis not present

## 2022-05-15 DIAGNOSIS — K769 Liver disease, unspecified: Secondary | ICD-10-CM | POA: Diagnosis not present

## 2022-05-15 DIAGNOSIS — R7303 Prediabetes: Secondary | ICD-10-CM | POA: Diagnosis not present

## 2022-05-15 DIAGNOSIS — G43909 Migraine, unspecified, not intractable, without status migrainosus: Secondary | ICD-10-CM | POA: Diagnosis not present

## 2022-05-15 DIAGNOSIS — E782 Mixed hyperlipidemia: Secondary | ICD-10-CM | POA: Diagnosis not present

## 2022-05-15 DIAGNOSIS — K219 Gastro-esophageal reflux disease without esophagitis: Secondary | ICD-10-CM | POA: Diagnosis not present

## 2022-05-15 DIAGNOSIS — G4733 Obstructive sleep apnea (adult) (pediatric): Secondary | ICD-10-CM | POA: Diagnosis not present

## 2022-05-19 DIAGNOSIS — M25561 Pain in right knee: Secondary | ICD-10-CM | POA: Diagnosis not present

## 2022-05-20 DIAGNOSIS — Z96651 Presence of right artificial knee joint: Secondary | ICD-10-CM | POA: Diagnosis not present

## 2022-05-20 DIAGNOSIS — Z471 Aftercare following joint replacement surgery: Secondary | ICD-10-CM | POA: Diagnosis not present

## 2022-05-22 ENCOUNTER — Telehealth: Payer: Self-pay | Admitting: Internal Medicine

## 2022-05-22 NOTE — Chronic Care Management (AMB) (Signed)
  Chronic Care Management   Note  05/22/2022 Name: Kimberly Ray MRN: [id number] DOB: 1951-03-30  Kimberly Ray is a 71 y.o. year old female who is a primary care patient of Unk Pinto, MD. I reached out to Edwin Cap by phone today in response to a referral sent by Kimberly Ray's PCP, Unk Pinto, MD.   [name] was given information about Chronic Care Management services today including:  CCM service includes personalized support from designated clinical staff supervised by her physician, including individualized plan of care and coordination with other care providers 24/7 contact phone numbers for assistance for urgent and routine care needs. Service will only be billed when office clinical staff spend 20 minutes or more in a month to coordinate care. Only one practitioner may furnish and bill the service in a calendar month. The patient may stop CCM services at any time (effective at the end of the month) by phone call to the office staff.   Patient agreed to services and verbal consent obtained.   Follow up [name]

## 2022-05-23 ENCOUNTER — Telehealth: Payer: Self-pay

## 2022-05-23 DIAGNOSIS — M25561 Pain in right knee: Secondary | ICD-10-CM | POA: Diagnosis not present

## 2022-05-23 NOTE — Telephone Encounter (Signed)
LM-05/23/22-Chart prep started. Reviewing OV, consults, Hospital visits, labs, and medication changes. Chart prep completed.  Total time spent: 55 min.

## 2022-05-27 DIAGNOSIS — M25561 Pain in right knee: Secondary | ICD-10-CM | POA: Diagnosis not present

## 2022-05-28 ENCOUNTER — Encounter: Payer: Self-pay | Admitting: Neurology

## 2022-05-28 MED ORDER — GABAPENTIN 300 MG PO CAPS: 300.0000 mg | ORAL_CAPSULE | Freq: Every day | ORAL | 5 refills | Status: DC

## 2022-05-28 NOTE — Addendum Note (Signed)
Addended by: [name] on: 05/28/2022 02:54 PM   Modules accepted: Orders

## 2022-05-30 DIAGNOSIS — M25561 Pain in right knee: Secondary | ICD-10-CM | POA: Diagnosis not present

## 2022-06-03 DIAGNOSIS — M25561 Pain in right knee: Secondary | ICD-10-CM | POA: Diagnosis not present

## 2022-06-06 DIAGNOSIS — M25561 Pain in right knee: Secondary | ICD-10-CM | POA: Diagnosis not present

## 2022-06-06 NOTE — Telephone Encounter (Signed)
LM-06/06/22-Called pt. And informed her CP will be in a mandatory meeting on her scheduled visit date on 10/24 and r/s pt. Pt. R/s to 07/29/22 at 10:30AM for a phone visit. Informed pt. I will call her a few days prior to visit to confirm. Pt. Verbalized understanding and agreed.  Total time spent: 7 min.

## 2022-06-10 ENCOUNTER — Telehealth: Payer: Medicare Other | Admitting: Pharmacy Technician

## 2022-06-10 DIAGNOSIS — M25561 Pain in right knee: Secondary | ICD-10-CM | POA: Diagnosis not present

## 2022-06-11 ENCOUNTER — Ambulatory Visit (INDEPENDENT_AMBULATORY_CARE_PROVIDER_SITE_OTHER): Payer: Medicare Other | Admitting: Nurse Practitioner

## 2022-06-11 ENCOUNTER — Encounter: Payer: Self-pay | Admitting: Nurse Practitioner

## 2022-06-11 VITALS — BP 116/70 | HR 74 | Temp 97.5°F | Ht 66.5 in | Wt 188.8 lb

## 2022-06-11 DIAGNOSIS — K219 Gastro-esophageal reflux disease without esophagitis: Secondary | ICD-10-CM | POA: Diagnosis not present

## 2022-06-11 DIAGNOSIS — R21 Rash and other nonspecific skin eruption: Secondary | ICD-10-CM

## 2022-06-11 MED ORDER — OMEPRAZOLE 20 MG PO CPDR: DELAYED_RELEASE_CAPSULE | ORAL | 3 refills | Status: DC

## 2022-06-11 NOTE — Progress Notes (Signed)
Assessment and Plan:  Kimberly Ray was seen today for an episodic visit.  Diagnoses and all order for this visit:  Gastroesophageal reflux disease, unspecified whether esophagitis present Continue Omeprazole No suspected reflux complications (Barret/stricture). Lifestyle modification:  wt loss, avoid meals 2-3h before bedtime. Consider eliminating food triggers:  chocolate, caffeine, EtOH, acid/spicy food.  Rash Possible medication interaction Refer to Kimberly Ray, Pharmacist for review of polypharmacy Start antihistamine Zyrtec Refer to dermatology Monitor for increase in fever, chills, N/V.  Notify office for further evaluation and treatment, questions or concerns if s/s fail to improve. The risks and benefits of my recommendations, as well as other treatment options were discussed with the patient today. Questions were answered.  Further disposition pending results of labs. Discussed med's effects and SE's.    Over 15 minutes of exam, counseling, chart review, and critical decision making was performed.   Future Appointments  Date Time Provider [place]  07/24/2022 11:30 AM Unk Pinto, MD [facility] None  07/29/2022 10:30 AM [name], RPH [facility] None  08/19/2022 10:45 AM [name], NP [facility] None  12/16/2022  4:00 PM Darrol Jump, NP [facility] None    ------------------------------------------------------------------------------------------------------------------   HPI BP 116/70   Pulse 74   Temp (!) 97.5 F (36.4 C)   Ht 5' 6.5" (1.689 m)   Wt 188 lb 12.8 oz (85.6 kg)   SpO2 98%   BMI 30.02 kg/m   71 y.o.female presents for evaluation of skin rash that she reports has been present since 03/2022.  Rash is intermittent.  Has recently occurred on her back but resides on her chest and BLE upper thighs.  Non-pruritic. Denies any new soaps, lotions, contact with other allergens.  New medication changes include restarting  Gabapentin for sleep and Lamictal dose was increased to 150 mg. She also takes Fluoxetine, Aripiprazole, Bupropion. She had a total right knee replacement 05/20/22.  Has tried topical creams including steroid creams without improvement. Denies fever, chills, N/V, abd pain.    Past Medical History:  Diagnosis Date   Adnexal mass 04/08/2020   Anemia    Depression    Extrinsic asthma 11/09/2014   Gait abnormality 12/26/2019   GERD (gastroesophageal reflux disease)    Hay fever    Head trauma    IBS (irritable bowel syndrome)    Memory loss    Migraines    OSA on CPAP    Reflux    RLS (restless legs syndrome)    Sinus congestion      Allergies  Allergen Reactions   Erythromycin Other (See Comments)    TBS   Iodinated Contrast Media Other (See Comments)    Hot flashes, itching scalp Hot flashes, itching scalp   Penicillins Hives   Clarithromycin Other (See Comments)    Itching ankles   Brintellix [Vortioxetine]     Itching   Cholestatin     Current Outpatient Medications on File Prior to Visit  Medication Sig   acetaminophen (TYLENOL) 650 MG CR tablet Take 650 mg by mouth every 8 (eight) hours as needed for pain.   ARIPiprazole (ABILIFY) 10 MG tablet Take 1 tablet (10 mg total) by mouth daily. (Patient taking differently: Take 5 mg by mouth daily.)   buPROPion (WELLBUTRIN XL) 300 MG 24 hr tablet Take 1 tablet  Daily  for Mood, Focus  &  Concentration   cholecalciferol (VITAMIN D3) 25 MCG (1000 UNIT) tablet Take 4,000 Units by mouth daily.   COVID-19 mRNA bivalent vaccine,  Pfizer, (PFIZER COVID-19 VAC BIVALENT) injection Inject into the muscle.   Cyanocobalamin 2500 MCG SUBL Place 1 tablet under the tongue daily.   ezetimibe (ZETIA) 10 MG tablet Take  1 tablet  Daily  for Cholesterol   famotidine (PEPCID) 20 MG tablet Take  1 tablet  2 x /day  to Prevent Heartburn & Indigestion (Patient taking differently: 20 mg 2 (two) times daily as needed for heartburn or indigestion.)    FLUoxetine (PROZAC) 10 MG capsule Take 20 mg by mouth daily.   gabapentin (NEURONTIN) 300 MG capsule Take 1 capsule (300 mg total) by mouth at bedtime.   Icosapent Ethyl (VASCEPA) 0.5 g CAPS Take 1 capsule by mouth 2 (two) times daily.   lamoTRIgine (LAMICTAL) 100 MG tablet Take  1 tablet  Daily  to Stabilize Mood   omeprazole (PRILOSEC) 20 MG capsule Take  1 capsule  2 x /day  to Prevent Indigestion & Heartburn (Patient taking differently: as needed (Prevent Indigestion and headburn). Take  1 capsule  2 x /day  to Prevent Indigestion & Heartburn)   ondansetron (ZOFRAN-ODT) 8 MG disintegrating tablet DISSOLVE 1 TABLET ON THE  TONGUE EVERY 8 HOURS AS  NEEDED FOR NAUSEA AND  VOMITING   SUCRALFATE PO Take 1 g by mouth as needed.   SUMAtriptan (IMITREX) 25 MG tablet Take 1-2 tab with onset of migraine. May repeat in 2 hours if headache persists. Max 4 tabs in 24 hours.   Coenzyme Q10 (CO Q 10 PO) Take by mouth in the morning and at bedtime. (Patient not taking: Reported on 06/11/2022)   No current facility-administered medications on file prior to visit.    ROS: all negative except what is noted in the HPI.   Physical Exam:  BP 116/70   Pulse 74   Temp (!) 97.5 F (36.4 C)   Ht 5' 6.5" (1.689 m)   Wt 188 lb 12.8 oz (85.6 kg)   SpO2 98%   BMI 30.02 kg/m   General Appearance: NAD.  Awake, conversant and cooperative. Eyes: PERRLA, EOMs intact.  Sclera white.  Conjunctiva without erythema. Sinuses: No frontal/maxillary tenderness.  No nasal discharge. Nares patent.  ENT/Mouth: Ext aud canals clear.  Bilateral TMs w/DOL and without erythema or bulging. Hearing intact.  Posterior pharynx without swelling or exudate.  Tonsils without swelling or erythema.  Neck: Supple.  No masses, nodules or thyromegaly. Respiratory: Effort is regular with non-labored breathing. Breath sounds are equal bilaterally without rales, rhonchi, wheezing or stridor.  Cardio: RRR with no MRGs. Brisk peripheral pulses  without edema.  Abdomen: Active BS in all four quadrants.  Soft and non-tender without guarding, rebound tenderness, hernias or masses. Lymphatics: Non tender without lymphadenopathy.  Musculoskeletal: Full ROM, 5/5 strength, normal ambulation.  No clubbing or cyanosis. Skin: Generalized scattered raised erythematous papules along posterior back, anterior chest and BLE upper thighs.  Appropriate color for ethnicity. Warm. Neuro: CN II-XII grossly normal. Normal muscle tone without cerebellar symptoms and intact sensation.   Psych: AO X 3,  appropriate mood and affect, insight and judgment.     Darrol Jump, NP 2:52 PM [facility]

## 2022-06-13 DIAGNOSIS — M25561 Pain in right knee: Secondary | ICD-10-CM | POA: Diagnosis not present

## 2022-06-17 DIAGNOSIS — M25561 Pain in right knee: Secondary | ICD-10-CM | POA: Diagnosis not present

## 2022-06-20 DIAGNOSIS — M25561 Pain in right knee: Secondary | ICD-10-CM | POA: Diagnosis not present

## 2022-06-24 DIAGNOSIS — M25561 Pain in right knee: Secondary | ICD-10-CM | POA: Diagnosis not present

## 2022-06-25 DIAGNOSIS — Z471 Aftercare following joint replacement surgery: Secondary | ICD-10-CM | POA: Diagnosis not present

## 2022-06-25 DIAGNOSIS — Z96651 Presence of right artificial knee joint: Secondary | ICD-10-CM | POA: Diagnosis not present

## 2022-06-26 DIAGNOSIS — M25561 Pain in right knee: Secondary | ICD-10-CM | POA: Diagnosis not present

## 2022-07-01 DIAGNOSIS — M25561 Pain in right knee: Secondary | ICD-10-CM | POA: Diagnosis not present

## 2022-07-03 ENCOUNTER — Other Ambulatory Visit: Payer: Self-pay | Admitting: Internal Medicine

## 2022-07-03 DIAGNOSIS — K219 Gastro-esophageal reflux disease without esophagitis: Secondary | ICD-10-CM

## 2022-07-04 DIAGNOSIS — L309 Dermatitis, unspecified: Secondary | ICD-10-CM | POA: Diagnosis not present

## 2022-07-08 DIAGNOSIS — M25561 Pain in right knee: Secondary | ICD-10-CM | POA: Diagnosis not present

## 2022-07-09 ENCOUNTER — Encounter: Payer: Self-pay | Admitting: Nurse Practitioner

## 2022-07-09 ENCOUNTER — Ambulatory Visit (INDEPENDENT_AMBULATORY_CARE_PROVIDER_SITE_OTHER): Payer: Medicare Other | Admitting: Nurse Practitioner

## 2022-07-09 ENCOUNTER — Other Ambulatory Visit: Payer: Self-pay

## 2022-07-09 VITALS — BP 120/54 | HR 77 | Temp 97.7°F | Ht 66.5 in | Wt 184.0 lb

## 2022-07-09 DIAGNOSIS — R0989 Other specified symptoms and signs involving the circulatory and respiratory systems: Secondary | ICD-10-CM

## 2022-07-09 DIAGNOSIS — J0100 Acute maxillary sinusitis, unspecified: Secondary | ICD-10-CM | POA: Diagnosis not present

## 2022-07-09 DIAGNOSIS — R6889 Other general symptoms and signs: Secondary | ICD-10-CM

## 2022-07-09 DIAGNOSIS — Z1152 Encounter for screening for COVID-19: Secondary | ICD-10-CM | POA: Diagnosis not present

## 2022-07-09 LAB — POC COVID19 BINAXNOW: SARS Coronavirus 2 Ag: NEGATIVE

## 2022-07-09 LAB — POCT INFLUENZA A/B
Influenza A, POC: NEGATIVE
Influenza B, POC: NEGATIVE

## 2022-07-09 MED ORDER — DEXAMETHASONE 1 MG PO TABS: ORAL_TABLET | ORAL | 0 refills | Status: DC

## 2022-07-09 MED ORDER — AZITHROMYCIN 250 MG PO TABS: ORAL_TABLET | ORAL | 1 refills | Status: DC

## 2022-07-09 NOTE — Progress Notes (Signed)
Assessment and Plan:  Kimberly Ray was seen today for acute visit.  Diagnoses and all orders for this visit:  Flu-like symptoms -     POCT Influenza A/B- Negative  Encounter for screening for COVID-19 -     POC COVID-19- Negative  Labile hypertension - Controlled without medications. Continue DASH diet, exercise and monitor at home. Call if greater than 130/80.   Acute non-recurrent maxillary sinusitis Push fluids, Tylenol as needed for fever greater than 101 or body aches, Mucinex as needed for deongestant If no improvement in next 5 days notify the office -     dexamethasone (DECADRON) 1 MG tablet; Take 3 tabs for 2 days, 2 tabs for 2 days 1 tab for 3 days. Take with food. -     azithromycin (ZITHROMAX) 250 MG tablet; Take 2 tablets (500 mg) on  Day 1,  followed by 1 tablet (250 mg) once daily on Days 2 through 5.       Further disposition pending results of labs. Discussed med's effects and SE's.   Over 30 minutes of exam, counseling, chart review, and critical decision making was performed.   Future Appointments  Date Time Provider [place]  07/24/2022 11:30 AM Unk Pinto, MD [facility] None  07/29/2022 10:30 AM [name], RPH [facility] None  08/08/2022  9:40 AM Tobb, Godfrey Pick, DO [facility] None  08/19/2022 10:45 AM [name], NP [facility] None  12/16/2022  4:00 PM Darrol Jump, NP [facility] None    ------------------------------------------------------------------------------------------------------------------   HPI BP (!) 120/54   Pulse 77   Temp 97.7 F (36.5 C)   Ht 5' 6.5" (1.689 m)   Wt 184 lb (83.5 kg)   SpO2 95%   BMI 29.25 kg/m    71 y.o.female presents for congestion with inability to get mucus expressed, intermittent sore throat, fatigue, dry cough and SOB on exertion x 14 days.  She had right knee replacement on 04/29/22 still in PT.    BP is well controlled without medications.  Denies chest pain, headaches and  dizziness BP Readings from Last 3 Encounters:  07/09/22 (!) 120/54  06/11/22 116/70  04/18/22 118/76   BMI is Body mass index is 29.25 kg/m., she has been working on diet and exercise. Wt Readings from Last 3 Encounters:  07/09/22 184 lb (83.5 kg)  06/11/22 188 lb 12.8 oz (85.6 kg)  04/18/22 193 lb (87.5 kg)     Past Medical History:  Diagnosis Date   Adnexal mass 04/08/2020   Anemia    Depression    Extrinsic asthma 11/09/2014   Gait abnormality 12/26/2019   GERD (gastroesophageal reflux disease)    Hay fever    Head trauma    IBS (irritable bowel syndrome)    Memory loss    Migraines    OSA on CPAP    Reflux    RLS (restless legs syndrome)    Sinus congestion      Allergies  Allergen Reactions   Erythromycin Other (See Comments)    TBS   Iodinated Contrast Media Other (See Comments)    Hot flashes, itching scalp Hot flashes, itching scalp   Penicillins Hives   Clarithromycin Other (See Comments)    Itching ankles   Brintellix [Vortioxetine]     Itching   Cholestatin     Current Outpatient Medications on File Prior to Visit  Medication Sig   acetaminophen (TYLENOL) 650 MG CR tablet Take 650 mg by mouth every 8 (eight) hours as needed for pain.  buPROPion (WELLBUTRIN XL) 300 MG 24 hr tablet Take 1 tablet  Daily  for Mood, Focus  &  Concentration   cholecalciferol (VITAMIN D3) 25 MCG (1000 UNIT) tablet Take 4,000 Units by mouth daily.   Cyanocobalamin 2500 MCG SUBL Place 1 tablet under the tongue daily.   ezetimibe (ZETIA) 10 MG tablet Take  1 tablet  Daily  for Cholesterol   famotidine (PEPCID) 20 MG tablet TAKE 1 TABLET BY MOUTH TWICE  DAILY TO PREVENT HEARTBURN &amp;  INDIGESTION   FLUoxetine (PROZAC) 10 MG capsule Take 20 mg by mouth daily.   guaiFENesin (MUCINEX PO) Take by mouth.   Icosapent Ethyl (VASCEPA) 0.5 g CAPS Take 1 capsule by mouth 2 (two) times daily.   lamoTRIgine (LAMICTAL) 100 MG tablet Take  1 tablet  Daily  to Stabilize Mood   omeprazole  (PRILOSEC) 20 MG capsule Take  1 capsule  2 x /day  to Prevent Indigestion & Heartburn   ondansetron (ZOFRAN-ODT) 8 MG disintegrating tablet DISSOLVE 1 TABLET ON THE  TONGUE EVERY 8 HOURS AS  NEEDED FOR NAUSEA AND  VOMITING   Oxymetazoline HCl (NASAL SPRAY NA) Place into the nose.   SUCRALFATE PO Take 1 g by mouth as needed.   SUMAtriptan (IMITREX) 25 MG tablet Take 1-2 tab with onset of migraine. May repeat in 2 hours if headache persists. Max 4 tabs in 24 hours.   triamcinolone cream (KENALOG) 0.1 %    ARIPiprazole (ABILIFY) 10 MG tablet Take 1 tablet (10 mg total) by mouth daily. (Patient not taking: Reported on 07/09/2022)   ASPIRIN 81 PO Aspirin   clindamycin (CLEOCIN) 300 MG capsule    Coenzyme Q10 (CO Q 10 PO) Take by mouth in the morning and at bedtime. (Patient not taking: Reported on 06/11/2022)   COVID-19 mRNA bivalent vaccine, Pfizer, (PFIZER COVID-19 VAC BIVALENT) injection Inject into the muscle.   gabapentin (NEURONTIN) 300 MG capsule Take 1 capsule (300 mg total) by mouth at bedtime. (Patient not taking: Reported on 07/09/2022)   No current facility-administered medications on file prior to visit.    ROS: all negative except above.   Physical Exam:  BP (!) 120/54   Pulse 77   Temp 97.7 F (36.5 C)   Ht 5' 6.5" (1.689 m)   Wt 184 lb (83.5 kg)   SpO2 95%   BMI 29.25 kg/m   General Appearance: Well nourished, in no apparent distress. Eyes: PERRLA, EOMs, conjunctiva no swelling or erythema Sinuses: Positive maxillary tenderness bilaterally ENT/Mouth: Ext aud canals clear, TMs without erythema, bulging. No erythema, swelling, or exudate on post pharynx.  Tonsils not swollen or erythematous. Hearing normal. Nares erythematous with green mucus Neck: Supple, thyroid normal.  Respiratory: Respiratory effort normal, BS equal bilaterally without rales, rhonchi, wheezing or stridor.  Cardio: RRR with no MRGs. Brisk peripheral pulses without edema.  Abdomen: Soft, + BS.  Non  tender, no guarding, rebound, hernias, masses. Lymphatics: Positive left cervical adenopathy Musculoskeletal: Full ROM, 5/5 strength, normal gait.  Skin: Warm, dry without rashes, lesions, ecchymosis.  Neuro: Cranial nerves intact. Normal muscle tone, no cerebellar symptoms. Sensation intact.  Psych: Awake and oriented X 3, normal affect, Insight and Judgment appropriate.     Alycia Rossetti, NP 2:49 PM [facility]

## 2022-07-09 NOTE — Patient Instructions (Signed)

## 2022-07-14 DIAGNOSIS — M25561 Pain in right knee: Secondary | ICD-10-CM | POA: Diagnosis not present

## 2022-07-17 ENCOUNTER — Ambulatory Visit: Payer: Medicare Other | Attending: Cardiology | Admitting: Cardiology

## 2022-07-17 ENCOUNTER — Encounter: Payer: Self-pay | Admitting: Cardiology

## 2022-07-17 ENCOUNTER — Ambulatory Visit (HOSPITAL_COMMUNITY)
Admission: RE | Admit: 2022-07-17 | Discharge: 2022-07-17 | Disposition: A | Discharge status: Home / Self Care | Payer: Medicare Other | Source: Ambulatory Visit | Attending: Cardiology | Admitting: Cardiology

## 2022-07-17 VITALS — BP 130/76 | HR 77 | Ht 66.5 in | Wt 187.4 lb

## 2022-07-17 DIAGNOSIS — R7303 Prediabetes: Secondary | ICD-10-CM | POA: Diagnosis not present

## 2022-07-17 DIAGNOSIS — Z79899 Other long term (current) drug therapy: Secondary | ICD-10-CM

## 2022-07-17 DIAGNOSIS — E781 Pure hyperglyceridemia: Secondary | ICD-10-CM

## 2022-07-17 DIAGNOSIS — R0602 Shortness of breath: Secondary | ICD-10-CM | POA: Diagnosis not present

## 2022-07-17 DIAGNOSIS — M25561 Pain in right knee: Secondary | ICD-10-CM | POA: Diagnosis not present

## 2022-07-17 DIAGNOSIS — R079 Chest pain, unspecified: Secondary | ICD-10-CM

## 2022-07-17 DIAGNOSIS — R0609 Other forms of dyspnea: Secondary | ICD-10-CM

## 2022-07-17 DIAGNOSIS — I340 Nonrheumatic mitral (valve) insufficiency: Secondary | ICD-10-CM | POA: Diagnosis not present

## 2022-07-17 MED ORDER — POTASSIUM CHLORIDE CRYS ER 20 MEQ PO TBCR: 20.0000 meq | EXTENDED_RELEASE_TABLET | Freq: Every day | ORAL | 1 refills | Status: DC

## 2022-07-17 MED ORDER — FUROSEMIDE 40 MG PO TABS: 40.0000 mg | ORAL_TABLET | Freq: Every day | ORAL | 1 refills | Status: DC

## 2022-07-17 NOTE — Progress Notes (Signed)
Cardiology Office Note:    Date:  07/17/2022   ID:  Kimberly Ray, Kimberly Ray Oct 28, 1950, MRN [id number]  PCP:  Unk Pinto, MD  Cardiologist:  Berniece Salines, DO  Electrophysiologist:  None   Referring MD: Unk Pinto, MD   " I am short of breath"  History of Present Illness:    Symiah Nowotny is a 71 y.o. female with a hx of OSA on CPAP, hypertriglyceridemia, stage III kidney disease, prediabetes, mild mitral regurgitation.  At her last visit in February 2023 at that time I discussed her echo result with her.  We discussed about repeating echo in about 6 to 9 months. Her coronary CT scan was done prior to that visit and had not show any evidence of coronary artery disease.  Today she came into the office reporting that she has been significantly short of breath.  She is short of breath on minimal exertion and she is really concerned about this.  She is significantly fatigued.   Past Medical History:  Diagnosis Date   Adnexal mass 04/08/2020   Anemia    Depression    Extrinsic asthma 11/09/2014   Gait abnormality 12/26/2019   GERD (gastroesophageal reflux disease)    Hay fever    Head trauma    IBS (irritable bowel syndrome)    Memory loss    Migraines    OSA on CPAP    Reflux    RLS (restless legs syndrome)    Sinus congestion     Past Surgical History:  Procedure Laterality Date   ABDOMINAL HYSTERECTOMY     BREAST BIOPSY Right 02/27/2014   Stereo- Benign   CHOLECYSTECTOMY  11/03/2017   Dr. Rosendo Gros   LAPAROSCOPIC HYSTERECTOMY  12/2017   NASAL SINUS SURGERY     ORIF ANKLE FRACTURE BIMALLEOLAR Left 03/14/2019   [facility], Dr. Sheran Lawless   perforated eardrum     left side   TONSILLECTOMY AND ADENOIDECTOMY     URETEROSCOPY     for ureteral stone    Current Medications: Current Meds  Medication Sig   acetaminophen (TYLENOL) 650 MG CR tablet Take 650 mg by mouth every 8 (eight) hours as needed for pain.   ASPIRIN 81 PO Aspirin   cholecalciferol  (VITAMIN D3) 25 MCG (1000 UNIT) tablet Take 4,000 Units by mouth daily.   clindamycin (CLEOCIN) 300 MG capsule    COVID-19 mRNA bivalent vaccine, Pfizer, (PFIZER COVID-19 VAC BIVALENT) injection Inject into the muscle.   Cyanocobalamin 2500 MCG SUBL Place 1 tablet under the tongue daily.   ezetimibe (ZETIA) 10 MG tablet Take  1 tablet  Daily  for Cholesterol   famotidine (PEPCID) 20 MG tablet TAKE 1 TABLET BY MOUTH TWICE  DAILY TO PREVENT HEARTBURN &amp;  INDIGESTION   FLUoxetine (PROZAC) 10 MG capsule Take 20 mg by mouth daily.   furosemide (LASIX) 40 MG tablet Take 1 tablet (40 mg total) by mouth daily.   guaiFENesin (MUCINEX PO) Take by mouth.   Icosapent Ethyl (VASCEPA) 0.5 g CAPS Take 1 capsule by mouth 2 (two) times daily.   lamoTRIgine (LAMICTAL) 100 MG tablet Take  1 tablet  Daily  to Stabilize Mood   omeprazole (PRILOSEC) 20 MG capsule Take  1 capsule  2 x /day  to Prevent Indigestion & Heartburn   ondansetron (ZOFRAN-ODT) 8 MG disintegrating tablet DISSOLVE 1 TABLET ON THE  TONGUE EVERY 8 HOURS AS  NEEDED FOR NAUSEA AND  VOMITING   Oxymetazoline HCl (NASAL SPRAY NA) Place into the  nose.   potassium chloride SA (KLOR-CON M) 20 MEQ tablet Take 1 tablet (20 mEq total) by mouth daily.   SUCRALFATE PO Take 1 g by mouth as needed.   SUMAtriptan (IMITREX) 25 MG tablet Take 1-2 tab with onset of migraine. May repeat in 2 hours if headache persists. Max 4 tabs in 24 hours.   triamcinolone cream (KENALOG) 0.1 %      Allergies:   Erythromycin, Iodinated contrast media, Penicillins, Clarithromycin, Brintellix [vortioxetine], and Cholestatin   Social History   Socioeconomic History   Marital status: Divorced    Spouse name: Not on file   Number of children: 2   Years of education: 13   Highest education level: Not on file  Occupational History    Employer: Special educational needs teacher    Comment: Flight Attendant  Tobacco Use   Smoking status: Never   Smokeless tobacco: Never  Vaping Use    Vaping Use: Never used  Substance and Sexual Activity   Alcohol use: Not Currently    Alcohol/week: 0.0 - 1.0 standard drinks of alcohol    Comment: Once a year   Drug use: No   Sexual activity: Not Currently  Other Topics Concern   Not on file  Social History Narrative   Patient is single and lives at home alone. Patient is flight attendant. Patient has college education one year      Right handed.   Caffeine- Rare.    Social Determinants of Health   Financial Resource Strain: Not on file  Food Insecurity: Not on file  Transportation Needs: Not on file  Physical Activity: Inactive (04/06/2018)   Exercise Vital Sign    Days of Exercise per Week: 0 days    Minutes of Exercise per Session: 0 min  Stress: No Stress Concern Present (04/06/2018)   [place]    Feeling of Stress : Only a little  Social Connections: Not on file     Family History: The patient's family history includes Anxiety disorder in her brother, father, and sister; Bipolar disorder in her brother; Breast cancer in her cousin; Breast cancer (age of onset: 76) in her maternal aunt; Breast cancer (age of onset: 80) in her maternal grandmother; Colon cancer (age of onset: 14) in her paternal grandmother; Dementia in her father; Depression in her sister; Diabetes in her mother and sister; Heart failure in her father; Heart failure (age of onset: 60) in her maternal grandfather; Mental illness in her paternal grandmother; Mood Disorder in her father; Osteoporosis in her brother; Stomach cancer in her maternal aunt.  ROS:   Review of Systems  Constitution: Negative for decreased appetite, fever and weight gain.  HENT: Negative for congestion, ear discharge, hoarse voice and sore throat.   Eyes: Negative for discharge, redness, vision loss in right eye and visual halos.  Cardiovascular: Report dyspnea on exertion.  Negative for chest pain, leg swelling,  orthopnea and palpitations.  Respiratory: Negative for cough, hemoptysis, shortness of breath and snoring.   Endocrine: Negative for heat intolerance and polyphagia.  Hematologic/Lymphatic: Negative for bleeding problem. Does not bruise/bleed easily.  Skin: Negative for flushing, nail changes, rash and suspicious lesions.  Musculoskeletal: Negative for arthritis, joint pain, muscle cramps, myalgias, neck pain and stiffness.  Gastrointestinal: Negative for abdominal pain, bowel incontinence, diarrhea and excessive appetite.  Genitourinary: Negative for decreased libido, genital sores and incomplete emptying.  Neurological: Negative for brief paralysis, focal weakness, headaches and loss of balance.  Psychiatric/Behavioral: Negative for altered mental status, depression and suicidal ideas.  Allergic/Immunologic: Negative for HIV exposure and persistent infections.    EKGs/Labs/Other Studies Reviewed:    The following studies were reviewed today:   EKG:  The ekg ordered today demonstrates sinus rhythm compared to prior EKG no significant change.   TE 09/13/2021 IMPRESSIONS   1. Left ventricular ejection fraction, by estimation, is 55 to 60%. The  left ventricle has normal function. The left ventricle has no regional  wall motion abnormalities. Left ventricular diastolic parameters were  normal. The average left ventricular  global longitudinal strain is -19.7 %. The global longitudinal strain is  normal.   2. Right ventricular systolic function is normal. The right ventricular  size is normal.   3. Left atrial size was mildly dilated.   4. Right atrial size was mildly dilated.   5. There is prolapse of the posterior leaflet MR appears mild but may be  underestimated Chordal SAM with no appreciable LVOT turbulence . The  mitral valve is abnormal. No evidence of mitral valve regurgitation. No  evidence of mitral stenosis. Moderate  mitral annular calcification.   6. The aortic valve is  tricuspid. There is mild calcification of the  aortic valve. There is mild thickening of the aortic valve. Aortic valve  regurgitation is mild. Aortic valve sclerosis is present, with no evidence  of aortic valve stenosis.   7. The inferior vena cava is normal in size with greater than 50%  respiratory variability, suggesting right atrial pressure of 3 mmHg.   FINDINGS   Left Ventricle: Left ventricular ejection fraction, by estimation, is 55  to 60%. The left ventricle has normal function. The left ventricle has no  regional wall motion abnormalities. The average left ventricular global  longitudinal strain is -19.7 %.  The global longitudinal strain is normal. The left ventricular internal  cavity size was normal in size. There is no left ventricular hypertrophy.  Left ventricular diastolic parameters were normal.   Right Ventricle: The right ventricular size is normal. No increase in  right ventricular wall thickness. Right ventricular systolic function is  normal.   Left Atrium: Left atrial size was mildly dilated.   Right Atrium: Right atrial size was mildly dilated.   Pericardium: There is no evidence of pericardial effusion.   Mitral Valve: There is prolapse of the posterior leaflet MR appears mild  but may be underestimated Chordal SAM with no appreciable LVOT turbulence.  The mitral valve is abnormal. There is mild thickening of the mitral valve  leaflet(s). Moderate mitral  annular calcification. No evidence of mitral valve regurgitation. No  evidence of mitral valve stenosis.   Tricuspid Valve: The tricuspid valve is normal in structure. Tricuspid  valve regurgitation is not demonstrated. No evidence of tricuspid  stenosis.   Aortic Valve: The aortic valve is tricuspid. There is mild calcification  of the aortic valve. There is mild thickening of the aortic valve. Aortic  valve regurgitation is mild. Aortic valve sclerosis is present, with no  evidence of aortic  valve stenosis.   Pulmonic Valve: The pulmonic valve was normal in structure. Pulmonic valve  regurgitation is not visualized. No evidence of pulmonic stenosis.   Aorta: The aortic root is normal in size and structure.   Venous: The inferior vena cava is normal in size with greater than 50%  respiratory variability, suggesting right atrial pressure of 3 mmHg.   IAS/Shunts: No atrial level shunt detected by color flow Doppler.  CCTA 09/19/2021   FINDINGS: Image quality: Excellent.   Noise artifact is: Limited.   Motion artifact present in mid LCx and LAD.   Coronary Arteries:  Normal coronary origin.  Right dominance.   Left main: The left main is a large caliber vessel with a normal take off from the left coronary cusp that bifurcates to form a left anterior descending artery and a left circumflex artery. There is no plaque or stenosis.   Left anterior descending artery: The LAD is patent without evidence of plaque or stenosis. The LAD gives off 2 patent diagonal branches.   Left circumflex artery: The LCX is non-dominant and patent with no evidence of plaque or stenosis. The LCX gives off 2 patent obtuse marginal branches.   Right coronary artery: The RCA is dominant with normal take off from the right coronary cusp. There is no evidence of plaque or stenosis. The RCA terminates as a PDA and right posterolateral branch without evidence of plaque or stenosis.   Right Atrium: Right atrial size is within normal limits.   Right Ventricle: The right ventricular cavity is within normal limits.   Left Atrium: Left atrial size is normal in size with no left atrial appendage filling defect.   Left Ventricle: The ventricular cavity size is within normal limits. There are no stigmata of prior infarction. There is no abnormal filling defect.   Pulmonary arteries: Normal in size without proximal filling defect.   Pulmonary veins: Normal pulmonary venous drainage.    Pericardium: Normal thickness with no significant effusion or calcium present.   Cardiac valves: The aortic valve is trileaflet without significant calcification. The mitral valve is normal structure with mild mitral annular calcification of the posterior annulus.   Aorta: Normal caliber with no significant disease.   Extra-cardiac findings: See attached radiology report for non-cardiac structures.   IMPRESSION: 1. Coronary calcium score of 0. This was 0 percentile for age-, sex, and race-matched controls.   2.  Normal coronary origin with right dominance.   3.  Normal coronary arteries.  CAD RADS 0.   4.  Consider non cardiac etiologist of chest pain.   RECOMMENDATIONS: 1. CAD-RADS 0: No evidence of CAD (0%). Consider non-atherosclerotic causes of chest pain.   2. CAD-RADS 1: Minimal non-obstructive CAD (0-24%). Consider non-atherosclerotic causes of chest pain. Consider preventive therapy and risk factor modification.   3. CAD-RADS 2: Mild non-obstructive CAD (25-49%). Consider non-atherosclerotic causes of chest pain. Consider preventive therapy and risk factor modification.   4. CAD-RADS 3: Moderate stenosis. Consider symptom-guided anti-ischemic pharmacotherapy as well as risk factor modification per guideline directed care. Additional analysis with CT FFR will be submitted.   5. CAD-RADS 4: Severe stenosis. (70-99% or > 50% left main). Cardiac catheterization or CT FFR is recommended. Consider symptom-guided anti-ischemic pharmacotherapy as well as risk factor modification per guideline directed care. Invasive coronary angiography recommended with revascularization per published guideline statements.   6. CAD-RADS 5: Total coronary occlusion (100%). Consider cardiac catheterization or viability assessment. Consider symptom-guided anti-ischemic pharmacotherapy as well as risk factor modification per guideline directed care.   7. CAD-RADS N: Non-diagnostic  study. Obstructive CAD can't be excluded. Alternative evaluation is recommended.   Fransico Him, MD     Electronically Signed   By: Fransico Him M.D.   On: 09/20/2021 13:17    Addended by Sueanne Margarita, MD on 09/20/2021  1:19 PM    Study Result   Narrative & Impression  EXAM: OVER-READ INTERPRETATION  CT CHEST  The following report is an over-read performed by radiologist Dr. Suzy Bouchard of [facility], [place] on 09/20/2021. This over-read does not include interpretation of cardiac or coronary anatomy or pathology. The coronary CTA interpretation by the cardiologist is attached.   COMPARISON:  None.   FINDINGS: Limited view of the lung parenchyma demonstrates no suspicious nodularity. Airways are normal.   Limited view of the mediastinum demonstrates no adenopathy. Esophagus normal.   Limited view of the upper abdomen unremarkable.   Limited view of the skeleton and chest wall is unremarkable.   IMPRESSION: No significant extracardiac findings.   Electronically Signed: By: Suzy Bouchard M.D. On: 09/20/2021 11:47    Recent Labs: 04/18/2022: ALT 33; BUN 19; Creat 0.94; Hemoglobin 13.1; Magnesium 1.9; Platelets 278; Potassium 4.6; Sodium 140; TSH 1.78  Recent Lipid Panel    Component Value Date/Time   CHOL 184 04/18/2022 1041   CHOL 197 10/15/2021 1101   TRIG 319 (H) 04/18/2022 1041   HDL 45 (L) 04/18/2022 1041   HDL 48 10/15/2021 1101   CHOLHDL 4.1 04/18/2022 1041   VLDL 61 (H) 01/20/2017 1006   LDLCALC 96 04/18/2022 1041    Physical Exam:    VS:  BP 130/76   Pulse 77   Ht 5' 6.5" (1.689 m)   Wt 187 lb 6.4 oz (85 kg)   SpO2 98%   BMI 29.79 kg/m     Wt Readings from Last 3 Encounters:  07/17/22 187 lb 6.4 oz (85 kg)  07/09/22 184 lb (83.5 kg)  06/11/22 188 lb 12.8 oz (85.6 kg)     GEN: Well nourished, well developed in no acute distress HEENT: Normal NECK: No JVD; No carotid bruits LYMPHATICS: No lymphadenopathy CARDIAC: S1S2  noted,RRR, no murmurs, rubs, gallops RESPIRATORY:  bilateral crackles without rales, wheezing or rhonchi  ABDOMEN: Soft, non-tender, non-distended, +bowel sounds, no guarding. EXTREMITIES: No edema, No cyanosis, no clubbing MUSCULOSKELETAL:  No deformity  SKIN: Warm and dry NEUROLOGIC:  Alert and oriented x 3, non-focal PSYCHIATRIC:  Normal affect, good insight  ASSESSMENT:    1. SOB (shortness of breath)   2. Chest pain, unspecified type   3. Medication management   4. DOE (dyspnea on exertion)   5. Nonrheumatic mitral valve regurgitation   6. Hypertriglyceridemia   7. Prediabetes    PLAN:     Without shortness of breath and her crackles in the lung without daily start the patient on Lasix 40 mg daily with potassium supplement. I am going to repeat her echocardiogram to reassess her mitral valve. We will get blood work today which include BMP, mag, CBC, BNP.  The patient is in agreement with the above plan. The patient left the office in stable condition.  The patient will follow up in 3 weeks   Medication Adjustments/Labs and Tests Ordered: Current medicines are reviewed at length with the patient today.  Concerns regarding medicines are outlined above.  Orders Placed This Encounter  Procedures   DG Chest 2 View   Comprehensive metabolic panel   Magnesium   Pro b natriuretic peptide (BNP)   CBC   TSH   EKG 12-Lead   ECHOCARDIOGRAM COMPLETE   Meds ordered this encounter  Medications   furosemide (LASIX) 40 MG tablet    Sig: Take 1 tablet (40 mg total) by mouth daily.    Dispense:  90 tablet    Refill:  1   potassium chloride SA (KLOR-CON M) 20 MEQ tablet    Sig: Take 1  tablet (20 mEq total) by mouth daily.    Dispense:  90 tablet    Refill:  1    Patient Instructions  Medication Instructions:  Your physician has recommended you make the following change in your medication:  START: Lasix '40mg'$  daily START: Potassium 20 meq's daily  Please take your lasix and  potassium together.  *If you need a refill on your cardiac medications before your next appointment, please call your pharmacy*   Lab Work: Your physician recommends that you have the following labs drawn today: CMET, Magnesium, BNP, CBC and TSH  If you have labs (blood work) drawn today and your tests are completely normal, you will receive your results only by: [place] (if you have MyChart) OR A paper copy in the mail If you have any lab test that is abnormal or we need to change your treatment, we will call you to review the results.   Testing/Procedures: Your physician has requested that you have an echocardiogram. Echocardiography is a painless test that uses sound waves to create images of your heart. It provides your doctor with information about the size and shape of your heart and how well your heart's chambers and valves are working. This procedure takes approximately one hour. There are no restrictions for this procedure. Please do NOT wear cologne, perfume, aftershave, or lotions (deodorant is allowed). Please arrive 15 minutes prior to your appointment time.  A chest x-ray takes a picture of the organs and structures inside the chest, including the heart, lungs, and blood vessels. This test can show several things, including, whether the heart is enlarges; whether fluid is building up in the lungs; and whether pacemaker / defibrillator leads are still in place.    Follow-Up: At [facility], you and your health needs are our priority.  As part of our continuing mission to provide you with exceptional heart care, we have created designated Provider Care Teams.  These Care Teams include your primary Cardiologist (physician) and Advanced Practice Providers (APPs -  Physician Assistants and Nurse Practitioners) who all work together to provide you with the care you need, when you need it.  We recommend signing up for the patient portal called "MyChart".  Sign up  information is provided on this After Visit Summary.  MyChart is used to connect with patients for Virtual Visits (Telemedicine).  Patients are able to view lab/test results, encounter notes, upcoming appointments, etc.  Non-urgent messages can be sent to your provider as well.   To learn more about what you can do with MyChart, go to [email or web address].    Your next appointment:   3 week(s)  The format for your next appointment:   In Person  Provider:   Berniece Salines, DO     Adopting a Healthy Lifestyle.  Know what a healthy weight is for you (roughly BMI <25) and aim to maintain this   Aim for 7+ servings of fruits and vegetables daily   65-80+ fluid ounces of water or unsweet tea for healthy kidneys   Limit to max 1 drink of alcohol per day; avoid smoking/tobacco   Limit animal fats in diet for cholesterol and heart health - choose grass fed whenever available   Avoid highly processed foods, and foods high in saturated/trans fats   Aim for low stress - take time to unwind and care for your mental health   Aim for 150 min of moderate intensity exercise weekly for heart health, and weights twice weekly for bone  health   Aim for 7-9 hours of sleep daily   When it comes to diets, agreement about the perfect plan isnt easy to find, even among the experts. Experts at the [place] developed an idea known as the Healthy Eating Plate. Just imagine a plate divided into logical, healthy portions.   The emphasis is on diet quality:   Load up on vegetables and fruits - one-half of your plate: Aim for color and variety, and remember that potatoes dont count.   Go for whole grains - one-quarter of your plate: Whole wheat, barley, wheat berries, quinoa, oats, brown rice, and foods made with them. If you want pasta, go with whole wheat pasta.   Protein power - one-quarter of your plate: Fish, chicken, beans, and nuts are all healthy, versatile protein sources.  Limit red meat.   The diet, however, does go beyond the plate, offering a few other suggestions.   Use healthy plant oils, such as olive, canola, soy, corn, sunflower and peanut. Check the labels, and avoid partially hydrogenated oil, which have unhealthy trans fats.   If youre thirsty, drink water. Coffee and tea are good in moderation, but skip sugary drinks and limit milk and dairy products to one or two daily servings.   The type of carbohydrate in the diet is more important than the amount. Some sources of carbohydrates, such as vegetables, fruits, whole grains, and beans-are healthier than others.   Finally, stay active  Signed, Berniece Salines, DO  07/17/2022 1:12 PM    [facility]

## 2022-07-17 NOTE — Patient Instructions (Addendum)
Medication Instructions:  Your physician has recommended you make the following change in your medication:  START: Lasix '40mg'$  daily START: Potassium 20 meq's daily  Please take your lasix and potassium together.  *If you need a refill on your cardiac medications before your next appointment, please call your pharmacy*   Lab Work: Your physician recommends that you have the following labs drawn today: CMET, Magnesium, BNP, CBC and TSH  If you have labs (blood work) drawn today and your tests are completely normal, you will receive your results only by: [place] (if you have MyChart) OR A paper copy in the mail If you have any lab test that is abnormal or we need to change your treatment, we will call you to review the results.   Testing/Procedures: Your physician has requested that you have an echocardiogram. Echocardiography is a painless test that uses sound waves to create images of your heart. It provides your doctor with information about the size and shape of your heart and how well your heart's chambers and valves are working. This procedure takes approximately one hour. There are no restrictions for this procedure. Please do NOT wear cologne, perfume, aftershave, or lotions (deodorant is allowed). Please arrive 15 minutes prior to your appointment time.  A chest x-ray takes a picture of the organs and structures inside the chest, including the heart, lungs, and blood vessels. This test can show several things, including, whether the heart is enlarges; whether fluid is building up in the lungs; and whether pacemaker / defibrillator leads are still in place.    Follow-Up: At [facility], you and your health needs are our priority.  As part of our continuing mission to provide you with exceptional heart care, we have created designated Provider Care Teams.  These Care Teams include your primary Cardiologist (physician) and Advanced Practice Providers (APPs -  Physician  Assistants and Nurse Practitioners) who all work together to provide you with the care you need, when you need it.  We recommend signing up for the patient portal called "MyChart".  Sign up information is provided on this After Visit Summary.  MyChart is used to connect with patients for Virtual Visits (Telemedicine).  Patients are able to view lab/test results, encounter notes, upcoming appointments, etc.  Non-urgent messages can be sent to your provider as well.   To learn more about what you can do with MyChart, go to [email or web address].    Your next appointment:   3 week(s)  The format for your next appointment:   In Person  Provider:   Berniece Salines, DO

## 2022-07-18 ENCOUNTER — Ambulatory Visit (HOSPITAL_COMMUNITY)
Admission: RE | Admit: 2022-07-18 | Discharge: 2022-07-18 | Disposition: A | Discharge status: Home / Self Care | Payer: Medicare Other | Source: Ambulatory Visit | Attending: Cardiology | Admitting: Cardiology

## 2022-07-18 DIAGNOSIS — R0609 Other forms of dyspnea: Secondary | ICD-10-CM | POA: Insufficient documentation

## 2022-07-18 DIAGNOSIS — I517 Cardiomegaly: Secondary | ICD-10-CM | POA: Diagnosis not present

## 2022-07-18 LAB — COMPREHENSIVE METABOLIC PANEL
ALT: 44 IU/L — ABNORMAL HIGH (ref 0–32)
AST: 59 IU/L — ABNORMAL HIGH (ref 0–40)
Albumin/Globulin Ratio: 2 (ref 1.2–2.2)
Albumin: 4.4 g/dL (ref 3.8–4.8)
Alkaline Phosphatase: 94 IU/L (ref 44–121)
BUN/Creatinine Ratio: 27 (ref 12–28)
BUN: 25 mg/dL (ref 8–27)
Bilirubin Total: 0.2 mg/dL (ref 0.0–1.2)
CO2: 23 mmol/L (ref 20–29)
Calcium: 9.7 mg/dL (ref 8.7–10.3)
Chloride: 103 mmol/L (ref 96–106)
Creatinine, Ser: 0.94 mg/dL (ref 0.57–1.00)
Globulin, Total: 2.2 g/dL (ref 1.5–4.5)
Glucose: 95 mg/dL (ref 70–99)
Potassium: 4.6 mmol/L (ref 3.5–5.2)
Sodium: 141 mmol/L (ref 134–144)
Total Protein: 6.6 g/dL (ref 6.0–8.5)
eGFR: 64 mL/min/{1.73_m2} (ref >59)

## 2022-07-18 LAB — ECHOCARDIOGRAM COMPLETE
AR max vel: 2.52 cm2
AV Area VTI: 2.39 cm2
AV Area mean vel: 2.42 cm2
AV Mean grad: 6 mmHg
AV Peak grad: 11.6 mmHg
Ao pk vel: 1.7 m/s
Area-P 1/2: 4.86 cm2
Calc EF: 57.5 %
MV M vel: 4.66 m/s
MV Peak grad: 86.7 mmHg
S' Lateral: 3.1 cm
Single Plane A2C EF: 56.5 %
Single Plane A4C EF: 58.1 %

## 2022-07-18 LAB — PRO B NATRIURETIC PEPTIDE: NT-Pro BNP: 165 pg/mL (ref 0–301)

## 2022-07-18 LAB — CBC
Hematocrit: 37.6 % (ref 34.0–46.6)
Hemoglobin: 11.6 g/dL (ref 11.1–15.9)
MCH: 26.4 pg — ABNORMAL LOW (ref 26.6–33.0)
MCHC: 30.9 g/dL — ABNORMAL LOW (ref 31.5–35.7)
MCV: 86 fL (ref 79–97)
Platelets: 377 10*3/uL (ref 150–450)
RBC: 4.39 x10E6/uL (ref 3.77–5.28)
RDW: 13.4 % (ref 11.7–15.4)
WBC: 9.3 10*3/uL (ref 3.4–10.8)

## 2022-07-18 LAB — TSH: TSH: 3.66 u[IU]/mL (ref 0.450–4.500)

## 2022-07-18 LAB — MAGNESIUM: Magnesium: 1.8 mg/dL (ref 1.6–2.3)

## 2022-07-18 NOTE — Progress Notes (Signed)
  Echocardiogram 2D Echocardiogram has been performed.  Kimberly Ray 07/18/2022, 11:34 AM

## 2022-07-21 ENCOUNTER — Telehealth: Payer: Self-pay

## 2022-07-21 DIAGNOSIS — M25561 Pain in right knee: Secondary | ICD-10-CM | POA: Diagnosis not present

## 2022-07-21 DIAGNOSIS — Z79899 Other long term (current) drug therapy: Secondary | ICD-10-CM | POA: Diagnosis not present

## 2022-07-21 NOTE — Telephone Encounter (Signed)
LM-07/21/22-Chart Prep started, Reviewing OV, Consults, Hospital visits, Labs and medication changes.  Chart prep complete for 12/12 CP initial visit.  Total time spent: 49 min.

## 2022-07-21 NOTE — Telephone Encounter (Signed)
LM-07/21/22-care plan created for upcoming CP visit.  Total time spent: 8 min.

## 2022-07-23 ENCOUNTER — Telehealth: Payer: Self-pay | Admitting: Cardiology

## 2022-07-23 ENCOUNTER — Encounter: Payer: Self-pay | Admitting: Cardiology

## 2022-07-23 DIAGNOSIS — M25561 Pain in right knee: Secondary | ICD-10-CM | POA: Diagnosis not present

## 2022-07-23 NOTE — Telephone Encounter (Signed)
Spoke with patient and gave her the results of echo CXR, & labs per Dr. Terrial Rhodes notes. She will keep her 12/21 appointment.

## 2022-07-23 NOTE — Telephone Encounter (Signed)
Patient called to talk with Dr. Harriet Masson or nurse. Please call back

## 2022-07-23 NOTE — Telephone Encounter (Signed)
LM-07/23/22-Pt. Returned call and confirmed CP visit for 12/12 at 10:30AM. Pt. Stated she did not realize she scheduled a phone visit and requested to be seen in the office. Placed pt. On a brief hold and messaged CP to make sure that would be okay. CP aware and I informed pt. CP approved visit to be in the office. Pt. Verbalized understanding and agreed. Completed precall questions. Pt. Would like to discuss medications she can possible eliminate from her current regimen. Pt. Denied any co-pay barriers or side effects from her medications.   Total time spent: 13 min.

## 2022-07-23 NOTE — Telephone Encounter (Signed)
LM-07/23/22-Called pt. To confirm CP initial phone visit for 12/12 at 10:30AM. Unable to reach pt. [place] X1.  Total time spent: 2 min.

## 2022-07-24 ENCOUNTER — Encounter: Payer: Self-pay | Admitting: Internal Medicine

## 2022-07-24 ENCOUNTER — Ambulatory Visit (INDEPENDENT_AMBULATORY_CARE_PROVIDER_SITE_OTHER): Payer: Medicare Other | Admitting: Internal Medicine

## 2022-07-24 VITALS — BP 112/70 | HR 112 | Temp 97.9°F | Resp 17 | Ht 67.0 in | Wt 185.6 lb

## 2022-07-24 DIAGNOSIS — E782 Mixed hyperlipidemia: Secondary | ICD-10-CM | POA: Diagnosis not present

## 2022-07-24 DIAGNOSIS — J370 Chronic laryngitis: Secondary | ICD-10-CM | POA: Diagnosis not present

## 2022-07-24 DIAGNOSIS — N1831 Chronic kidney disease, stage 3a: Secondary | ICD-10-CM

## 2022-07-24 DIAGNOSIS — R7309 Other abnormal glucose: Secondary | ICD-10-CM

## 2022-07-24 DIAGNOSIS — R0989 Other specified symptoms and signs involving the circulatory and respiratory systems: Secondary | ICD-10-CM

## 2022-07-24 DIAGNOSIS — Z79899 Other long term (current) drug therapy: Secondary | ICD-10-CM

## 2022-07-24 DIAGNOSIS — F339 Major depressive disorder, recurrent, unspecified: Secondary | ICD-10-CM

## 2022-07-24 DIAGNOSIS — K219 Gastro-esophageal reflux disease without esophagitis: Secondary | ICD-10-CM

## 2022-07-24 DIAGNOSIS — E559 Vitamin D deficiency, unspecified: Secondary | ICD-10-CM | POA: Diagnosis not present

## 2022-07-24 MED ORDER — EZETIMIBE 10 MG PO TABS: ORAL_TABLET | ORAL | 3 refills | Status: DC

## 2022-07-24 MED ORDER — FLUOXETINE HCL 10 MG PO CAPS: ORAL_CAPSULE | ORAL | 3 refills | Status: DC

## 2022-07-24 MED ORDER — ESOMEPRAZOLE MAGNESIUM 40 MG PO CPDR: DELAYED_RELEASE_CAPSULE | ORAL | 3 refills | Status: DC

## 2022-07-24 MED ORDER — LAMOTRIGINE 100 MG PO TABS: ORAL_TABLET | ORAL | 3 refills | Status: DC

## 2022-07-24 MED ORDER — LAMOTRIGINE 150 MG PO TABS: ORAL_TABLET | ORAL | 3 refills | Status: DC

## 2022-07-24 NOTE — Progress Notes (Signed)
Future Appointments  Date Time Provider Department  07/24/2022 11:30 AM Unk Pinto, MD [facility]  07/29/2022 10:30 AM [name], [facility]  08/07/2022 11:40 AM Berniece Salines, DO [facility]  08/19/2022 10:45 AM [name], NP [facility]  12/16/2022  4:00 PM Darrol Jump, NP [facility]          This very nice 71 y.o.  DWF presents for 3 month follow up with HTN, HLD, Pre-Diabetes and Vitamin D Deficiency.  Patient has OSA on CPAP with improved restorative sleep.  Patient's GERD is controlled on her meds, but she's c/o chronic cough & mild hoarseness .         Patient has long established hx/o Bipolar Depression and has been followed by Psychiatry.            Patient is followed  for labile HTN & BP has been controlled at home. Today's BP is at goal -  112/70.  Patient has had no complaints of any cardiac type chest pain, palpitations, dyspnea  orthopnea /PND, dizziness, claudication or dependent edema.       Hyperlipidemia is near controlled with diet & Ezetimibe Venia Minks . Patient denies myalgias or other med SE's. Last Lipids were at goal except elevated Trig's:  Lab Results  Component Value Date   CHOL 184 04/18/2022   HDL 45 (L) 04/18/2022   LDLCALC 96 04/18/2022   TRIG 319 (H) 04/18/2022   CHOLHDL 4.1 04/18/2022     Also, the patient has history of PreDiabetes (6.3% /2011)  and has had no symptoms of reactive hypoglycemia, diabetic polys, paresthesias or visual blurring.  Last A1c was  near goal :  Lab Results  Component Value Date   HGBA1C 5.9 (H) 04/18/2022      Further, the patient also has history of Vitamin D Deficiency ("33" /2012) and supplements vitamin D without any suspected side-effects. Last vitamin D was still not at goal (70-100):  Lab Results  Component Value Date   VD25OH 48 04/18/2022       Current Outpatient Medications:     acetaminophen (TYLENOL) 650 MG CR tablet, Take 650 mg by mouth every 8 (eight) hours as  needed for pain., Disp: , Rfl:    ASPIRIN 81 PO, Aspirin, Disp: , Rfl:    cholecalciferol (VITAMIN D3) 25 MCG (1000 UNIT) tablet, Take 4,000 Units by mouth daily., Disp: , Rfl:    clindamycin (CLEOCIN) 300 MG capsule, , Disp: , Rfl:    Cyanocobalamin 2500 MCG SUBL, Place 1 tablet under the tongue daily., Disp: , Rfl:    esomeprazole (NEXIUM) 40 MG capsule, Take  1 capsule  2 x /day  for GERD  & Laryngeal  Reflux, Disp: 180 capsule, Rfl: 3   famotidine (PEPCID) 20 MG tablet, TAKE 1 TABLET BY MOUTH TWICE  DAILY TO PREVENT HEARTBURN &amp;  INDIGESTION, Disp: 200 tablet, Rfl: 2   furosemide (LASIX) 40 MG tablet, Take 1 tablet (40 mg total) by mouth daily., Disp: 90 tablet, Rfl: 1   Icosapent Ethyl (VASCEPA) 0.5 g CAPS, Take 1 capsule by mouth 2 (two) times daily., Disp: 180 capsule, Rfl: 3   ondansetron (ZOFRAN-ODT) 8 MG disintegrating tablet, DISSOLVE 1 TABLET ON THE  TONGUE EVERY 8 HOURS AS  NEEDED FOR NAUSEA AND  VOMITING, Disp: 60 tablet, Rfl: 0   Oxymetazoline HCl (NASAL SPRAY NA), Place into the nose., Disp: , Rfl:    potassium chloride SA (KLOR-CON M) 20 MEQ tablet, Take 1  tablet (20 mEq total) by mouth daily., Disp: 90 tablet, Rfl: 1   SUCRALFATE PO, Take 1 g by mouth as needed., Disp: , Rfl:    SUMAtriptan (IMITREX) 25 MG tablet, Take 1-2 tab with onset of migraine. May repeat in 2 hours if headache persists. Max 4 tabs in 24 hours., Disp: 30 tablet, Rfl: 0   triamcinolone cream (KENALOG) 0.1 %, , Disp: , Rfl:    ezetimibe (ZETIA) 10 MG tablet, Take  1 tablet  Daily  for Cholesterol , Disp: 90 tablet, Rfl: 3   FLUoxetine (PROZAC) 10 MG capsule, Take 2 capsules ( 20 mg) Daily for Mood , Disp: 180 capsule, Rfl: 3   lamoTRIgine (LAMICTAL) 150 MG tablet, Take  1 tablet  Daily  for  Mood , Disp: 90 tablet, Rfl: 3    Allergies  Allergen Reactions   Erythromycin Other (See Comments)    TBS   Iodinated Diagnostic Agents Other (See Comments)    Hot flashes, itching scalp Hot flashes, itching  scalp   Penicillins Hives   Clarithromycin Other (See Comments)    Itching ankles   Brintellix [Vortioxetine]     Itching   PMHx:   Past Medical History:  Diagnosis Date   Adnexal mass 04/08/2020   Anemia    Depression    Extrinsic asthma 11/09/2014   Gait abnormality 12/26/2019   GERD (gastroesophageal reflux disease)    Hay fever    Head trauma    IBS (irritable bowel syndrome)    Memory loss    Migraines    OSA on CPAP    Reflux    RLS (restless legs syndrome)    Sinus congestion     Immunization History  Administered Date(s) Administered   Influenza, High Dose  06/12/2016, 05/13/2017, 06/25/2020   Influenza 06/02/2015, 05/19/2018, 05/19/2019   PFIZER SARS-COV-2 Vacc 09/12/2019, 10/03/2019, 04/30/2020   PPD Test 08/19/2011, 08/30/2013, 11/09/2014   Pneumococcal -13 08/18/2009, 02/18/2019   Pneumococcal - 23 06/12/2016   Td 08/19/2003   Tdap 08/30/2013   Zoster 08/18/2010    Past Surgical History:  Procedure Laterality Date   ABDOMINAL HYSTERECTOMY     BREAST BIOPSY Right 02/27/2014   Stereo- Benign   CHOLECYSTECTOMY  11/03/2017   Dr. Rosendo Gros   LAPAROSCOPIC HYSTERECTOMY  12/2017   NASAL SINUS SURGERY     ORIF ANKLE FRACTURE BIMALLEOLAR Left 03/14/2019   [facility], Dr. Sheran Lawless   perforated eardrum     left side   TONSILLECTOMY AND ADENOIDECTOMY     URETEROSCOPY     for ureteral stone    FHx:    Reviewed / unchanged  SHx:    Reviewed / unchanged   Systems Review:  Constitutional: Denies fever, chills, wt changes, headaches, insomnia, fatigue, night sweats, change in appetite. Eyes: Denies redness, blurred vision, diplopia, discharge, itchy, watery eyes.  ENT: Denies discharge, congestion, post nasal drip, epistaxis, sore throat, earache, hearing loss, dental pain, tinnitus, vertigo, sinus pain, snoring.  CV: Denies chest pain, palpitations, irregular heartbeat, syncope, dyspnea, diaphoresis, orthopnea, PND, claudication or edema. Respiratory: denies  cough, dyspnea, DOE, pleurisy, hoarseness, laryngitis, wheezing.  Gastrointestinal: Denies dysphagia, odynophagia, heartburn, reflux, water brash, abdominal pain or cramps, nausea, vomiting, bloating, diarrhea, constipation, hematemesis, melena, hematochezia  or hemorrhoids. Genitourinary: Denies dysuria, frequency, urgency, nocturia, hesitancy, discharge, hematuria or flank pain. Musculoskeletal: Denies arthralgias, myalgias, stiffness, jt. swelling, pain, limping or strain/sprain.  Skin: Denies pruritus, rash, hives, warts, acne, eczema or change in skin lesion(s). Neuro: No weakness, tremor, incoordination,  spasms, paresthesia or pain. Psychiatric: Denies confusion, memory loss or sensory loss. Endo: Denies change in weight, skin or hair change.  Heme/Lymph: No excessive bleeding, bruising or enlarged lymph nodes.  Physical Exam  BP 112/70   Pulse (!) 112   Temp 97.9 F (36.6 C)   Resp 17   Ht '5\' 7"'$  (1.702 m)   Wt 185 lb 9.6 oz (84.2 kg)   SpO2 97%   BMI 29.07 kg/m   Appears  well nourished, well groomed  and in no distress.  Eyes: PERRLA, EOMs, conjunctiva no swelling or erythema. Sinuses: No frontal/maxillary tenderness ENT/Mouth: EAC's clear, TM's nl w/o erythema, bulging. Nares clear w/o erythema, swelling, exudates. Oropharynx clear without erythema or exudates. Oral hygiene is good. Tongue normal, non obstructing. Hearing intact.  Neck: Supple. Thyroid not palpable. Car 2+/2+ without bruits, nodes or JVD. Chest: Respirations nl with BS clear & equal w/o rales, rhonchi, wheezing or stridor.  Cor: Heart sounds normal w/ regular rate and rhythm without sig. murmurs, gallops, clicks or rubs. Peripheral pulses normal and equal  without edema.  Abdomen: Soft & bowel sounds normal. Non-tender w/o guarding, rebound, hernias, masses or organomegaly.  Lymphatics: Unremarkable.  Musculoskeletal: Full ROM all peripheral extremities, joint stability, 5/5 strength and normal gait.  Skin:  Warm, dry without exposed rashes, lesions or ecchymosis apparent.  Neuro: Cranial nerves intact, reflexes equal bilaterally. Sensory-motor testing grossly intact. Tendon reflexes grossly intact.  Pysch: Alert & oriented x 3.  Insight and judgement nl & appropriate. No ideations.  Assessment and Plan:   1. Labile hypertension   - Continue medication, monitor blood pressure at home.  - Continue DASH diet.  Reminder to go to the ER if any CP,  SOB, nausea, dizziness, severe HA, changes vision/speech.   - CBC with Differential/Platelet - COMPLETE METABOLIC PANEL WITH GFR - Magnesium - TSH  2. Hyperlipidemia, mixed   - Continue diet/meds, exercise,& lifestyle modifications.  - Continue monitor periodic cholesterol/liver & renal functions    - Lipid panel - TSH  3. Abnormal glucose   - Continue diet, exercise  - Lifestyle modifications.  - Monitor appropriate labs.   - Hemoglobin A1c - Insulin, random  4. Vitamin D deficiency   - Continue supplementation.   - VITAMIN D 25 Hydroxy   5. Stage 3a chronic kidney disease (HCC)  - COMPLETE METABOLIC PANEL WITH GFR  6. Gastroesophageal reflux disease without esophagitis  - D/C Omeprazole   - esomeprazole 40 MG capsule; Take  1 capsule  2 x /day  for GERD  & Laryngeal  Reflux   Dispense: 180 capsule; Refill: 3    7. Laryngitis, chronic  - esomeprazole 40 MG capsule; Take  1 capsule  2 x /day  for GERD  & Laryngeal  Reflux   Dispense: 180 capsule; Refill: 3    8. Major depression, recurrent, chronic (HCC)  - TSH  9. Medication management  - CBC with Differential/Platelet - COMPLETE METABOLIC PANEL WITH GFR - Magnesium - Lipid panel - TSH - Hemoglobin A1c - Insulin, random - VITAMIN D 25 Hydroxy         Discussed  regular exercise, BP monitoring, weight control to achieve/maintain BMI less than 25 and discussed med and SE's. Recommended labs to assess and monitor clinical status with further disposition  pending results of labs.  I discussed the assessment and treatment plan with the patient. The patient was provided an opportunity to ask questions and all were answered. The patient agreed  with the plan and demonstrated an understanding of the instructions.  I provided over 30 minutes of exam, counseling, chart review and  complex critical decision making.         The patient was advised to call back or seek an in-person evaluation if the symptoms worsen or if the condition fails to improve as anticipated.   Kirtland Bouchard, MD

## 2022-07-24 NOTE — Patient Instructions (Addendum)

## 2022-07-25 LAB — COMPLETE METABOLIC PANEL WITH GFR
AG Ratio: 1.8 (calc) (ref 1.0–2.5)
ALT: 38 U/L — ABNORMAL HIGH (ref 6–29)
AST: 47 U/L — ABNORMAL HIGH (ref 10–35)
Albumin: 4.4 g/dL (ref 3.6–5.1)
Alkaline phosphatase (APISO): 79 U/L (ref 37–153)
BUN/Creatinine Ratio: 18 (calc) (ref 6–22)
BUN: 19 mg/dL (ref 7–25)
CO2: 24 mmol/L (ref 20–32)
Calcium: 9.6 mg/dL (ref 8.6–10.4)
Chloride: 99 mmol/L (ref 98–110)
Creat: 1.03 mg/dL — ABNORMAL HIGH (ref 0.60–1.00)
Globulin: 2.5 g/dL (calc) (ref 1.9–3.7)
Glucose, Bld: 148 mg/dL — ABNORMAL HIGH (ref 65–99)
Potassium: 4.5 mmol/L (ref 3.5–5.3)
Sodium: 137 mmol/L (ref 135–146)
Total Bilirubin: 0.4 mg/dL (ref 0.2–1.2)
Total Protein: 6.9 g/dL (ref 6.1–8.1)
eGFR: 58 mL/min/{1.73_m2} — ABNORMAL LOW (ref >=60)

## 2022-07-25 LAB — CBC WITH DIFFERENTIAL/PLATELET
Absolute Monocytes: 599 cells/uL (ref 200–950)
Basophils Absolute: 58 cells/uL (ref 0–200)
Basophils Relative: 0.8 %
Eosinophils Absolute: 88 cells/uL (ref 15–500)
Eosinophils Relative: 1.2 %
HCT: 37.7 % (ref 35.0–45.0)
Hemoglobin: 12 g/dL (ref 11.7–15.5)
Lymphs Abs: 1847 cells/uL (ref 850–3900)
MCH: 27 pg (ref 27.0–33.0)
MCHC: 31.8 g/dL — ABNORMAL LOW (ref 32.0–36.0)
MCV: 84.7 fL (ref 80.0–100.0)
MPV: 9.6 fL (ref 7.5–12.5)
Monocytes Relative: 8.2 %
Neutro Abs: 4709 cells/uL (ref 1500–7800)
Neutrophils Relative %: 64.5 %
Platelets: 357 10*3/uL (ref 140–400)
RBC: 4.45 10*6/uL (ref 3.80–5.10)
RDW: 13.2 % (ref 11.0–15.0)
Total Lymphocyte: 25.3 %
WBC: 7.3 10*3/uL (ref 3.8–10.8)

## 2022-07-25 LAB — HEMOGLOBIN A1C
Hgb A1c MFr Bld: 6.2 % of total Hgb — ABNORMAL HIGH (ref <5.7)
Mean Plasma Glucose: 131 mg/dL
eAG (mmol/L): 7.3 mmol/L

## 2022-07-25 LAB — LIPID PANEL
Cholesterol: 207 mg/dL — ABNORMAL HIGH (ref <200)
HDL: 60 mg/dL (ref >=50)
LDL Cholesterol (Calc): 115 mg/dL (calc) — ABNORMAL HIGH
Non-HDL Cholesterol (Calc): 147 mg/dL (calc) — ABNORMAL HIGH (ref <130)
Total CHOL/HDL Ratio: 3.5 (calc) (ref <5.0)
Triglycerides: 205 mg/dL — ABNORMAL HIGH (ref <150)

## 2022-07-25 LAB — INSULIN, RANDOM: Insulin: 132.8 u[IU]/mL — ABNORMAL HIGH

## 2022-07-25 LAB — MAGNESIUM: Magnesium: 1.8 mg/dL (ref 1.5–2.5)

## 2022-07-25 LAB — VITAMIN D 25 HYDROXY (VIT D DEFICIENCY, FRACTURES): Vit D, 25-Hydroxy: 58 ng/mL (ref 30–100)

## 2022-07-25 LAB — TSH: TSH: 3.29 mIU/L (ref 0.40–4.50)

## 2022-07-27 NOTE — Progress Notes (Signed)
<><><><><><><><><><><><><><><><><><><><><><><><><><><><><><><><><> <><><><><><><><><><><><><><><><><><><><><><><><><><><><><><><><><> - Test results slightly outside the reference range are not unusual. If there is anything important, I will review this with you,  otherwise it is considered normal test values.  If you have further questions,  please do not hesitate to contact me at the office or via My Chart.  <><><><><><><><><><><><><><><><><><><><><><><><><><><><><><><><><> <><><><><><><><><><><><><><><><><><><><><><><><><><><><><><><><><>  -  A1c = 62% is higher - and is  elevated in the borderline and                                                     early or pre-diabetes range which has the same   300% increased risk for heart attack, stroke, cancer and                                      alzheimer- type vascular dementia as full blown diabetes.   But the good news is that diet, exercise with weight loss can                                                                          cure the early diabetes at this point.  - Also, Insulin level = 132.8  is very high (Normal is less than 20 ! )  & elevated Insulin    300 % greater risk for heart attacks, strokes, cancer &                                                                                    Alzheimer type vascular dementia   - All this can be cured  and prevented with better diet  & losing weight   - get Dr Fara Olden Fuhrman's book 'the End of Diabetes" and "the End of Dieting"    -   It is very important that you work harder with diet by                                  avoiding all foods that are white except chicken, fish & calliflower.  - Avoid white rice  (brown & wild rice is OK),   - Avoid white potatoes  (sweet potatoes in moderation is OK),   White bread or wheat bread or anything made out of   white flour like bagels, donuts, rolls, buns, biscuits, cakes,  - pastries, cookies, pizza crust, and pasta  (made from  white flour & egg whites)   - vegetarian pasta or spinach or wheat pasta is OK.  - Multigrain breads like Arnold's, Pepperidge Farm or   multigrain sandwich thins or high fiber breads like   [place]  bread or "Dave's Killer" breads that are  4 to 5 grams fiber per slice !  are best.    Diet, exercise and weight loss can reverse and cure  diabetes in the early stages.    - Diet, exercise and weight loss is very important in the   control and prevention of complications of diabetes which  affects every system in your body, ie.   -Brain - dementia/stroke,  - eyes - glaucoma/blindness,  - heart - heart attack/heart failure,  - kidneys - dialysis,  - stomach - gastric paralysis,  - intestines - malabsorption,  - nerves - severe painful neuritis,  - circulation - gangrene & loss of a leg(s)  - and finally  . . . . . . . . . . . . . . . . . .    - cancer and Alzheimers. <><><><><><><><><><><><><><><><><><><><><><><><><><><><><><><><><> <><><><><><><><><><><><><><><><><><><><><><><><><><><><><><><><><>  -  Total Chol is worse - has gone up from 184 to now 207  and the   Bad / Dangerous LDL Chol has gone up from 96  to now 115   - Recommend a stricter  low cholesterol diet   - Cholesterol only comes from animal sources                                                                     - ie. meat, dairy, egg yolks  - Eat all the vegetables you want.  - Avoid Meat, Avoid Meat,  Avoid Meat                                                    - especially Red Meat - Beef AND Pork .  - Avoid cheese & dairy - milk & ice cream.     - Cheese is the most concentrated form of trans-fats which   is the WORST thing to clog up our arteries.   - Veggie cheese is OK which can be found in the fresh  produce section at [employer] or [employer] or  [employer] <><><><><><><><><><><><><><><><><><><><><><><><><><><><><><><><><> <><><><><><><><><><><><><><><><><><><><><><><><><><><><><><><><><>  -   Magnesium  = 1.8  is very  low   - goal is betw 2.0 - 2.5,   - So...Marland KitchenMarland KitchenMarland Kitchen  Recommend that you take Magnesium 500 mg tablet 2 x /day with Meals    - also important to eat lots of  leafy green vegetables   - spinach - Kale - collards - greens - okra - asparagus  - broccoli - quinoa - squash - almonds   - black, red, white beans  -  peas - green beans <><><><><><><><><><><><><><><><><><><><><><><><><><><><><><><><><> <><><><><><><><><><><><><><><><><><><><><><><><><><><><><><><><><>  -  Vit D = 58    is a little low   - Vitamin D goal is between 70-100.   - Please INCREASE your Vitamin D from 4,000 up to 6,000 units /day                ( So take 2,000 unit capsules x 3 caps /day )   - It is very important as a natural anti-inflammatory and helping the  immune system protect against viral infections, like the Covid-19     &  helping hair, skin, and nails, as well as reducing stroke and heart attack risk.   - It helps your bones and helps with mood.  - It also decreases numerous cancer risks so please  take it as directed.   - Low Vit D is associated with a 200-300% higher risk for       CANCER   and 200-300% higher risk for HEART   ATTACK  &  STROKE.    - It is also associated with higher death rate at younger ages,   autoimmune diseases like Rheumatoid arthritis, Lupus, Multiple Sclerosis.     - Also many other serious conditions, like depression, Alzheimer's  Dementia, infertility, muscle aches, fatigue, fibromyalgia  <><><><><><><><><><><><><><><><><><><><><><><><><><><><><><><><><> <><><><><><><><><><><><><><><><><><><><><><><><><><><><><><><><><>  -  All Else - CBC - Kidneys - Electrolytes - Liver - Magnesium & Thyroid    - all  Normal /  OK <><><><><><><><><><><><><><><><><><><><><><><><><><><><><><><><><> <><><><><><><><><><><><><><><><><><><><><><><><><><><><><><><><><>

## 2022-07-29 ENCOUNTER — Other Ambulatory Visit: Payer: Self-pay | Admitting: Internal Medicine

## 2022-07-29 ENCOUNTER — Ambulatory Visit: Payer: Medicare Other | Admitting: Pharmacy Technician

## 2022-07-29 DIAGNOSIS — K219 Gastro-esophageal reflux disease without esophagitis: Secondary | ICD-10-CM

## 2022-07-29 DIAGNOSIS — F339 Major depressive disorder, recurrent, unspecified: Secondary | ICD-10-CM

## 2022-07-29 DIAGNOSIS — E669 Obesity, unspecified: Secondary | ICD-10-CM

## 2022-07-29 DIAGNOSIS — E782 Mixed hyperlipidemia: Secondary | ICD-10-CM

## 2022-07-29 DIAGNOSIS — E66811 Obesity, class 1: Secondary | ICD-10-CM

## 2022-07-29 DIAGNOSIS — Z79899 Other long term (current) drug therapy: Secondary | ICD-10-CM

## 2022-07-29 DIAGNOSIS — E781 Pure hyperglyceridemia: Secondary | ICD-10-CM

## 2022-07-29 MED ORDER — PANTOPRAZOLE SODIUM 40 MG PO TBEC: DELAYED_RELEASE_TABLET | ORAL | 3 refills | Status: DC

## 2022-07-29 NOTE — Telephone Encounter (Signed)
LM-07/29/22-Called pt. To get her social security number and financial info for PAP for Repatha from [employer]. Unable to reach pt. [place] X1. Called the [employer] to see if I could go ahead and apply patient and was on hold for over one hour. I spoke to a staff member who said the grant is currently open, but that they have to have the social security number in order to apply. Will try calling pt. Again tomorrow morning to try and get information needed.  Total time spent:95 min. (1 hour, 35 min.)

## 2022-07-29 NOTE — Telephone Encounter (Signed)
LM-07/29/22-Pt. Returned call while I was on the phone w/ [employer]. Called pt. Back and obtained her social security number and monthly income. Informed pt. Unfortunately [employer] is not currently open, but I will call first thing in the morning to apply for Repatha PAP. Pt. Thanked me and verbalized understanding. Pt. Gave verbal consent for me to apply for her PAP.   Total time spent: 6 min.

## 2022-07-30 NOTE — Telephone Encounter (Signed)
LM-07/30/22-Called [facility] and was able to get pt. Approved for PAP for Repatha that will need to be renewed on 06/30/23. Called pt. And informed her she was approved and that she will receive her card in the mail along with reimburesement form in 7-10 business days. Pt. Verbalized understanding and agreed.  Total time spent: 53 min.

## 2022-07-30 NOTE — Progress Notes (Signed)
Initial Pharmacist Visit (CCM)  Handlin,Javen  71 years, Female  DOB: [birth year]  M: [phone]  __________________________________________________ Chronic Conditions Patient's Chronic Conditions: Gastroesophageal Reflux Disease (GERD), Osteopenia or Osteoporosis, Chronic Kidney Disease (CKD), Hyperlipidemia/Dyslipidemia (HLD), Depression, Diabetes (DM)  Summary for PCP:  1. Start Repatha, STOP Zetia and Vascepa. 2. Enrolled patient in [employer] for [place]. 3. Patient to call and set up [place] for [place] training. 4. PCP to switch Nexium to Protonix per patient request. 5. Patient needs updated Microalb/SCr checked 6. Patient needs FOBT, Cologuard, or colonoscopy.  Doctor and Hospital Visits Were there PCP Visits in last 6 months?: Yes PCP Visits details: 04/18/22-Tonya Cranford, NP- Pt. presented for CPE and f/u. BP 118/76 HR 73. No medication changes noted.  06/11/22-Tonya Cranford, NP-Pt. presented for evaluation of skin rash. BP 116/70 HR 74. No medication changes noted.  07/09/22-Dana Carlye Grippe, NP- Pt. presented for Flu like symptoms. BP 120/54 HR 77. STARTED Azithromycin 250 MG Take 2 tablets (500 mg) on  Day 1,  followed by 1 tablet (250 mg) once daily on Days 2 through 5. dexAMETHasone 1 MG Take 3 tabs for 2 days, 2 tabs for 2 days 1 tab for 3 days. Take with food.  STOPPED ARIPiprazole 10 mg Oral Daily Coenzyme Q10 2 times daily Gabapentin 300 mg Oral Daily at bedtime  Were there Specialist Visits in last 6 months?: Yes Specialist Visits details: 02/28/22-Ward, Julieta Bellini, MD (Ortho & Sports Medicine)-Pt. presented for evaluation of right knee pain. BP 113/70 HR 83. No medication changes noted.  03/11/22-Starnes, Amy E, PA ( Health & [facility]ness)-Pt. presented for preoperative consultation for ERAS ROBOTIC RIGHT KNEE ARTHROPLASTY TOTAL STRYKER. No medication changes noted.  03/17/22-Taylor, Yevonne Aline, PA-C (Orthopedics & Sports)-Pt. presented for pre-op  exam. No medication changes noted.  04/25/22-Ward, Julieta Bellini, MD  (Ortho)-Pt. presented for f/u. BP 125/69 HR 70.  05/05/22-Taylor, Yevonne Aline, PA-C (Ortho)-Pt. presented for post-op f/u. STOPPED oxyCODONE HCl (ROXICODONE) 5 mg  05/20/22-Taylor, Yevonne Aline, PA-C (Ortho)-Pt. presented for f/u.  06/25/22-Taylor, Yevonne Aline, PA-C (Ortho)-Pt. presented for f/u. No medication changes noted.  07/17/22-Tobb, Kardie, DO (Cardiology)-Pt. presented w/ c/o SOB. STARTED Furosemide 40 mg Oral Daily Potassium Chloride Crys ER 20 MEQ 20 mEq Oral Daily  STOPPED Azithromycin 250 MG Take 2 tablets (500 mg) on  Day 1,  followed by 1 tablet (250 mg) once daily on Days 2 through 5. (Patient Preference) buPROPion HCl 300 MG Take 1 tablet  Daily  for Mood, Focus  &  Concentration (Patient Preference) dexAMETHasone 1 MG Take 3 tabs for 2 days, 2 tabs for 2 days 1 tab for 3 days. Take with food. (Patient Preference)  Was there a Hospital Visit in last 30 days?: No Were there other Hospital Visits in last 6 months?: No Disease Assessments Subjective Information Visit Completed on: 07/29/2022 Did patient bring medications to appointment?: No What source (s) was used to reconcile medications this visit?: EMR list, Discharge summary, Patient/caregiver list Subjective: Very sweet lady presenting in office for CCM initial visit. She is divorced. She has 2 children. 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 She is very close with her mother, who is 67 years old. She would like to decrease medication burden. She also has a burden of depression. She says it runs in the family and winter months and since she retired has been worse. She is also SOB with exertion today and cardiology recently started he on Lasix and Klor-Con 1 week ago with  little change. She says she has a leaking mitral valve. No sick contacts. She says she really likes her recliner, never has any energy, and seems to have turned into a hermit since  COVID. Lifestyle habits: Diet: None-- eats out alot Exercise: None Tobacco: None Alcohol: Rare-- 1x year Caffeine: None Recreational drugs: None What is the patient's sleep pattern?: Other (with free form text) Details: Sleeping too much How many hours per night does patient typically sleep?: 8-12+  SDOH: Accountable Health Communities Health-Related Social Needs Screening Tool ([email or web address]) SDOH questions were documented and reviewed (EMR or [employer]) within the past 12 months or since hospitalization?: No What is your living situation today? (ref #1): I have a steady place to live Think about the place you live. Do you have problems with any of the following? (ref #2): None of the above Within the past 12 months, you worried that your food would run out before you got money to buy more (ref #3): Never true Within the past 12 months, the food you bought just didn't last and you didn't have money to get more (ref #4): Never true In the past 12 months, has lack of reliable transportation kept you from medical appointments, meetings, work or from getting things needed for daily living? (ref #5): No In the past 12 months, has the electric, gas, oil, or water company threatened to shut off services in your home? (ref #6): No How often does anyone, including family and friends, physically hurt you? (ref #7): Never (1) How often does anyone, including family and friends, insult or talk down to you? (ref #8): Never (1) How often does anyone, including friends and family, threaten you with harm? (ref #9): Never (1) How often does anyone, including family and friends, scream or curse at you? (ref #10): Never (1)  Medication Adherence Does [facility] have access to medication refill history?: Yes Medication adherence rates for STAR metric medications:  Ezetimibe '10mg'$ -10/21/21 (90DS), 01/14/22 (100DS)  Medication adherence rates for non-STAR  metric medications:  Omeprazole 05/27/21 (90DS), 06/12/22 (90DS) Lamotrigine '150mg'$  / 03/05/22 (90DS), 05/31/22 (30DS) Sumatriptan '25mg'$  / 03/20/22 (30DS), 04/19/22 (30DS)  Factors that may affect medication adherence?: Pill burden Name and location of Current pharmacy: [facility] Current Rx insurance plan: UHC Are meds synced by current pharmacy?: No Are meds delivered by current pharmacy?: Yes - by mail order pharmacy Would patient benefit from direct intervention of clinical lead in dispensing process to optimize clinical outcomes?: Yes Are [facility] pharmacy services available where patient lives?: Yes Is patient disadvantaged to use [facility]?: Yes Does patient experience delays in picking up medications due to transportation concerns (getting to pharmacy)?: No Medication organization: [facility] Additional Info: Prefers [facility] due to [facility] sending medications prior to clearing copays with patient. Assessment:: Adherent  Hyperlipidemia/Dyslipidemia (HLD) Last Lipid panel on: 07/24/2022 TC (Goal<200): 207 LDL: 115 HDL (Goal>40): 60 TG (Goal<150): 205 ASCVD 10-year risk?is:: Intermediate (7.5%-20%) ASCVD Risk Score: 11.5% Assessed today?: Yes LDL Goal: <70 We discussed: Complications of hyperlipidemia and prevention methods with patient for 5-10 minutes, Increasing exercise (walking, biking, swimming) to a goal of 30 minutes per day, as able based on current activity level and health or as directed by your healthcare provider, How a diet high in fruits/vegetables/nuts/whole grains/beans may help to reduce your cholesterol. Increasing soluble fiber intake.  Avoiding sugary foods and trans fat, limiting carbohydrates, and reducing portion sizes. Recommended increasing intake of healthy fats into their diet, Weight reduction- We  discussed losing 5-10% of body weight Assessment:: Uncontrolled Drug: Ezetimibe '10mg'$ -1 tab QD Pharmacist Assessment: Query  Appropriateness Drug: Vascepa BID Pharmacist Assessment: Query Appropriateness Plan/Follow up: Start Repatha per PCP approval Stop Zetia and Vascepa Counseled on diet and weight loss measurements Enroll patient in [employer] for [place]  Diabetes (DM) Most recent A1C: 6.2 taken on: 07/24/2022 Previous A1C: 5.9 taken on: 04/18/2022 Most Recent GFR: 58 taken on: 07/24/2022 Type: Pre-Diabetes/Impaired Fasting Glucose Assessed today?: No Drug: None  Depression Most recent PHQ-9 Score: 20 taken on: 12/11/2021 Assessed today?: Yes Completing the PHQ-9 Questionnaire today?: No In your opinion, how do you feel your depression symptoms have been controlled over the past 3 months?: Stable / stayed the same Additional Info: Patient states that [place] and retiring has been a major trigger because she has ultimately become a hermit. She misses her friends that moved away and she doesnt have that consistent social interaction as she did while she was a Catering manager. She is close with her mother but she does have a family hx of depression/bipolar. We discussed: Proper use of antidepressants, time to take effect, timeline of side effects and to report back if symptoms persist despite medical treatment, Depression treatment options including medication management and therapy/counseling, Calling the national suicide hotline (phone number 46) if patient has thoughts of self-harm Assessment:: Uncontrolled Drug: Fluoxetine '20mg'$ -1 cap QD Pharmacist Assessment: Appropriate, Effective, Safe, Accessible Drug: Lamictal '150mg'$  daily Pharmacist Assessment: Appropriate, Effective, Safe, Accessible Drug: Wellbutrin XL '300mg'$  daily Pharmacist Assessment: Appropriate, Effective, Safe, Accessible Plan/Follow up: Being managed by a psychiatrist Start exercise daily with mother Monitor BP at home three times weekly  GERD Assessed today?: No Drug: Famotidine '20mg'$ -1 tab BID Pharmacist Assessment:  Appropriate, Effective, Safe, Accessible Drug: Esomeprazole '40mg'$ -1 cap BID Pharmacist Assessment: Appropriate, Effective, Safe, Accessible Plan/Follow up: Pt requested change from esomeprazole to protonix. PCP to address.  Chronic kidney disease (CKD) Most Recent GFR: 58 taken on: 07/24/2022 Previous GFR: 64 taken on: 07/17/2022 Most recent microalbumin ratio: 7 tested on: 04/16/2021 Assessed today?: No Drug: None  Osteopenia or Osteoporosis Most recent T-score: -2.3 taken on: 09/05/2020 Previous T-score: -2.0 taken on: 09/02/2018 Most recent Vitamin D 25-OH: 58 taken on: 07/24/2022 Previous Vitamin D 25-OH: 55 taken on: 04/18/2022 Assessed today?: No Drug: Vitamin D3-Take 6,000U QD Pharmacist Assessment: Appropriate, Effective, Safe, Accessible  Preventative Health Care Gap: Colorectal cancer screening: Needs to be addressed Care Gap: Breast cancer screening: Addressed Care Gap: Annual Wellness Visit (AWV): Addressed  Additional exercise counseling points.  We discussed: aiming to lose 5 to 10% of body weight through lifestyle modifications, targeting at least 150 minutes per week of moderate-intensity aerobic exercise., encouraging participation in insurance-covered exercise program through local gym (i.e. [employer]), decreasing sedentary behavior Additional diet counseling points. We discussed: key components of the DASH diet, key components of a low-carb eating plan, key components of the Mediterranean diet, aiming to consume at least 8 cups of water day  Engagement Notes [name] on 08/01/2022 05:06 PM CPP Prep: 29mn CPP OV: 864m CPP Doc: [id number] Cae Plan: 7mi44mNext CCM Follow Up: 3 months Next AWV: 10/24/22 at 11:00AM Next PCP Visit: 01/30/2023 at 9:30[place]CM Services:  This encounter meets complex CCM services and moderate to high medical decision making.  Prior to outreach and patient consent for Chronic Care  Management, I referred this patient for services after reviewing the nominated patient list or from a personal encounter with the patient.  I  have personally reviewed this encounter including the documentation in this note and have collaborated with the care management provider regarding care management and care coordination activities to include development and update of the comprehensive care plan I am certifying that I agree with the content of this note and encounter as supervising physician. Pharmacy Interventions Intervention Details Pharmacist Interventions discussed: Yes Discontinued Therapy: Ineffective therapy Started Therapy: Additive / synergistic therapy, Preventative therapy Monitoring: Overdue labs, Preventative health screenings, Routine monitoring Accessibility: Financial assistance Education: Lifestyle modifications  [name], PharmD Clinical Pharmacist [email or web address] .care [phone]) [phone]   CP [place] years, Female  DOB: 11/09/1950  M: [phone]  __________________________________________________ General Information Details Chronic Conditions: Gastroesophageal Reflux Disease (GERD), Hyperlipidemia/Dyslipidemia (HLD), Depression  Review and act on the below actions as discussed with your clinical pharmacist.: 1. Start walking/exercise again as tolerated. 2. Start volunteering and stepping outside your comfort zone. 3. Track things that make depression worse. 4. Set up nurse visit once you get Repatha  Next Follow Up with the Pharmacist:: 3 months-- I will call to schedule Contact your clinical pharmacist and care team with any questions or concerns. Team Phone #:: [phone] Clinical Pharmacist:: [place]:: [place]  High Cholesterol GOAL:: Maintain LDL (bad) cholesterol less than 70 Your most recent LDL level is: 115 Your most recent triglycerides level is: 207 taken on:  07/24/2022 Patient education:: Discussed increasing exercise (walking, biking, swimming) to a goal of 30 minutes per day, as able based on current activity level and health and/or as directed by your healthcare provider., Discussed how a diet high in fruits/vegetables/nuts/whole grains/beans may help to reduce your cholesterol. Increasing soluble fiber intake (whole grains, fruits, vegetables, beans, nuts and seeds). Recommended avoiding sugary foods and trans fat, limiting carbohydrates, and reducing portion sizes. Recommended Patient to incorporate more healthy fats (salmon, cold-water fish, almonds, walnuts) into their diet. Your current cholesterol medications are::  Ezetimibe '10mg'$ -1 tab once daily  Additional Information:: STOP Zetia and Vascepa START Repatha Schedule nurse training visit for Repatha  Depression GOAL:: Reduce overall level, frequency, and intensity of depression symptoms so that daily functioning is not impaired Patient education:: Discussed depression treatment options with Patient (therapy/counseling, medication management), Educated Patient on proper use of medication, including effects, side effects and to report back if symptoms remain, Educated Patient on what to do if they have a crisis or thoughts of self-harm.  Contact PCP, CCM team or National Suicide Prevention Lifeline: 988 Your current depression medications are::  Fluoxetine '20mg'$ -1 cap once daily Lamictal '150mg'$  daily Bupropion '300mg'$  daily  Additional Information:: Start walking/exercise Start volunteering  Gastroesophageal Reflux Disease (GERD) GOAL:: Reduce and relieve severity and frequency of Gastroesophageal Reflux Disease (GERD) symptoms and prevent long-term complications Patient education:: Recommend elevating the head of the bed by 6-8 inches, avoid eating 2-3 hours before bedtime, and sleeping on the left side to reduce nighttime symptoms., Recommend avoiding tight fitting clothes around the waist.,  Recommend weight loss if overweight to help improve symptoms., Recommend eating small frequent meals and identifying and avoiding trigger foods (alcohol, acidic foods, caffeine, fatty foods, garlic, onion, peppermint, spicy foods) Your current GERD medications are::  Famotidine '20mg'$ -1 tab twice daily as needed Omeprazole '20mg'$ -1 cap twice daily  Additional Information:: Stop Omeprazole/Esomeprazole Start Pantoprazole  Other Needs Recommended Preventative Health Care:: Recommend colorectal cancer screening Other Resources and Referrals: Set up with [place] for [place].

## 2022-08-01 DIAGNOSIS — M25561 Pain in right knee: Secondary | ICD-10-CM | POA: Diagnosis not present

## 2022-08-05 ENCOUNTER — Other Ambulatory Visit: Payer: Self-pay | Admitting: Internal Medicine

## 2022-08-05 DIAGNOSIS — E782 Mixed hyperlipidemia: Secondary | ICD-10-CM

## 2022-08-05 MED ORDER — EVOLOCUMAB 140 MG/ML ~~LOC~~ SOAJ: 140.0000 mg | SUBCUTANEOUS | Status: DC

## 2022-08-06 NOTE — Telephone Encounter (Signed)
LM-08/06/22-Called pt. And she has not received her Repatha yet. Reviewed chart and it looks like it was called into her pharmacy yesterday. Informed pt. When she gets it will schedule a nurse visit to go over how to properly inject Repatha. Pt. Verbalized agreement and understanding. Connected pt. W/ her pharmacy to discuss picking up [place]. Pt. Agreed for me to check in tomorrow to set up nurse visit once she has her medication. Pt. Stated she was told by CP to stop her famotidine and another medication that she was unsure of the name. Informed pt. I will talk to CP and will let her know what changes should be made. (8 min.)

## 2022-08-07 ENCOUNTER — Encounter: Payer: Self-pay | Admitting: Cardiology

## 2022-08-07 ENCOUNTER — Ambulatory Visit: Payer: Medicare Other | Attending: Cardiology | Admitting: Cardiology

## 2022-08-07 VITALS — BP 110/66 | HR 81 | Ht 66.5 in | Wt 180.6 lb

## 2022-08-07 DIAGNOSIS — R7303 Prediabetes: Secondary | ICD-10-CM

## 2022-08-07 DIAGNOSIS — E782 Mixed hyperlipidemia: Secondary | ICD-10-CM

## 2022-08-07 DIAGNOSIS — G4733 Obstructive sleep apnea (adult) (pediatric): Secondary | ICD-10-CM | POA: Diagnosis not present

## 2022-08-07 DIAGNOSIS — E781 Pure hyperglyceridemia: Secondary | ICD-10-CM | POA: Diagnosis not present

## 2022-08-07 DIAGNOSIS — R0602 Shortness of breath: Secondary | ICD-10-CM

## 2022-08-07 DIAGNOSIS — E669 Obesity, unspecified: Secondary | ICD-10-CM

## 2022-08-07 DIAGNOSIS — I340 Nonrheumatic mitral (valve) insufficiency: Secondary | ICD-10-CM | POA: Diagnosis not present

## 2022-08-07 NOTE — Patient Instructions (Signed)
Medication Instructions:  Your physician has recommended you make the following change in your medication:  STOP: Lasix and Potassium  *If you need a refill on your cardiac medications before your next appointment, please call your pharmacy*   Lab Work: NONE If you have labs (blood work) drawn today and your tests are completely normal, you will receive your results only by: [place] (if you have MyChart) OR A paper copy in the mail If you have any lab test that is abnormal or we need to change your treatment, we will call you to review the results.   Testing/Procedures: NONE   Follow-Up: At [facility], you and your health needs are our priority.  As part of our continuing mission to provide you with exceptional heart care, we have created designated Provider Care Teams.  These Care Teams include your primary Cardiologist (physician) and Advanced Practice Providers (APPs -  Physician Assistants and Nurse Practitioners) who all work together to provide you with the care you need, when you need it.  We recommend signing up for the patient portal called "MyChart".  Sign up information is provided on this After Visit Summary.  MyChart is used to connect with patients for Virtual Visits (Telemedicine).  Patients are able to view lab/test results, encounter notes, upcoming appointments, etc.  Non-urgent messages can be sent to your provider as well.   To learn more about what you can do with MyChart, go to [email or web address].    Your next appointment:   1 year(s)  The format for your next appointment:   In Person  Provider:   Berniece Salines, DO

## 2022-08-07 NOTE — Telephone Encounter (Signed)
LM-08/07/22-Called pt. To see if she was able to get her Repatha. LVMTRC X1. (2 min.)

## 2022-08-07 NOTE — Telephone Encounter (Signed)
LM-08/07/22-Pt. Returned call and said she spoke to PCP and was informed that they were working on pts. RX for Repatha. Pt. States she will call if she is unable to get it. Pt. Aware to schedule a nurse visit at Dr. Idell Pickles office when she gets her medication. Reminded pt. To stop her Zetia and Vascepa when starting the Repatha. Pt. Verbalized understanding and agreed. Pt. Agreed 3/12 would work for her [employer]. (10 min.)

## 2022-08-08 ENCOUNTER — Ambulatory Visit: Payer: Medicare Other | Admitting: Cardiology

## 2022-08-08 DIAGNOSIS — M25561 Pain in right knee: Secondary | ICD-10-CM | POA: Diagnosis not present

## 2022-08-09 NOTE — Progress Notes (Signed)
Cardiology Office Note:    Date:  08/09/2022   ID:  Kimberly Ray, Kimberly Ray 1951-06-09, MRN [id number]  PCP:  Unk Pinto, MD  Cardiologist:  Berniece Salines, DO  Electrophysiologist:  None   Referring MD: Unk Pinto, MD   " I am short of breath"  History of Present Illness:    Kimberly Ray is a 71 y.o. female with a hx of OSA on CPAP, hypertriglyceridemia, stage III kidney disease, prediabetes, mild mitral regurgitation.  At her last visit in February 2023 at that time I discussed her echo result with her.  We discussed about repeating echo in about 6 to 9 months. Her coronary CT scan was done prior to that visit and had not show any evidence of coronary artery disease.  During her last visit I started the patient on Lasix as she was experiencing some crackles in the lungs.  I also ordered an echocardiogram.  She tells me that she had some reaction to the Lasix.  She had significant diarrhea.   Past Medical History:  Diagnosis Date   Adnexal mass 04/08/2020   Anemia    Depression    Extrinsic asthma 11/09/2014   Gait abnormality 12/26/2019   GERD (gastroesophageal reflux disease)    Hay fever    Head trauma    IBS (irritable bowel syndrome)    Memory loss    Migraines    OSA on CPAP    Reflux    RLS (restless legs syndrome)    Sinus congestion     Past Surgical History:  Procedure Laterality Date   ABDOMINAL HYSTERECTOMY     BREAST BIOPSY Right 02/27/2014   Stereo- Benign   CHOLECYSTECTOMY  11/03/2017   Dr. Rosendo Gros   LAPAROSCOPIC HYSTERECTOMY  12/2017   NASAL SINUS SURGERY     ORIF ANKLE FRACTURE BIMALLEOLAR Left 03/14/2019   [facility], Dr. Sheran Lawless   perforated eardrum     left side   TONSILLECTOMY AND ADENOIDECTOMY     URETEROSCOPY     for ureteral stone    Current Medications: Current Meds  Medication Sig   acetaminophen (TYLENOL) 650 MG CR tablet Take 650 mg by mouth every 8 (eight) hours as needed for pain.   ASPIRIN 81 PO  Aspirin   cholecalciferol (VITAMIN D3) 25 MCG (1000 UNIT) tablet Take 4,000 Units by mouth daily.   clindamycin (CLEOCIN) 300 MG capsule    Cyanocobalamin 2500 MCG SUBL Place 1 tablet under the tongue daily.   ezetimibe (ZETIA) 10 MG tablet Take 10 mg by mouth daily.   FLUoxetine (PROZAC) 10 MG capsule Take 2 capsules ( 20 mg) Daily for Mood                           /                                                      Take                               by                          mouth   lamoTRIgine (LAMICTAL) 150 MG tablet Take  1 tablet  Daily  for  Mood                          /                                                      Take                               by                          mouth   ondansetron (ZOFRAN-ODT) 8 MG disintegrating tablet DISSOLVE 1 TABLET ON THE  TONGUE EVERY 8 HOURS AS  NEEDED FOR NAUSEA AND  VOMITING   Oxymetazoline HCl (NASAL SPRAY NA) Place into the nose.   pantoprazole (PROTONIX) 40 MG tablet Take  1 tablet 2 x / day  to Prevent Heartburn &  Indigestion   SUCRALFATE PO Take 1 g by mouth as needed.   SUMAtriptan (IMITREX) 25 MG tablet Take 1-2 tab with onset of migraine. May repeat in 2 hours if headache persists. Max 4 tabs in 24 hours.   triamcinolone cream (KENALOG) 0.1 %    [DISCONTINUED] furosemide (LASIX) 40 MG tablet Take 1 tablet (40 mg total) by mouth daily.   [DISCONTINUED] potassium chloride SA (KLOR-CON M) 20 MEQ tablet Take 1 tablet (20 mEq total) by mouth daily.   Current Facility-Administered Medications for the 08/07/22 encounter (Office Visit) with [place] Godman, Godfrey Pick, DO  Medication   Evolocumab SOAJ 140 mg     Allergies:   Erythromycin, Iodinated contrast media, Penicillins, Clarithromycin, Brintellix [vortioxetine], and Cholestatin   Social History   Socioeconomic History   Marital status: Divorced    Spouse name: Not on file   Number of children: 2   Years of education: 13   Highest education level: Not on file  Occupational  History    Employer: Special educational needs teacher    Comment: Flight Attendant  Tobacco Use   Smoking status: Never   Smokeless tobacco: Never  Vaping Use   Vaping Use: Never used  Substance and Sexual Activity   Alcohol use: Not Currently    Alcohol/week: 0.0 - 1.0 standard drinks of alcohol    Comment: Once a year   Drug use: No   Sexual activity: Not Currently  Other Topics Concern   Not on file  Social History Narrative   Patient is single and lives at home alone. Patient is flight attendant. Patient has college education one year      Right handed.   Caffeine- Rare.    Social Determinants of Health   Financial Resource Strain: Not on file  Food Insecurity: Not on file  Transportation Needs: Not on file  Physical Activity: Inactive (04/06/2018)   Exercise Vital Sign    Days of Exercise per Week: 0 days    Minutes of Exercise per Session: 0 min  Stress: No Stress Concern Present (04/06/2018)   [place]    Feeling of Stress : Only a little  Social Connections: Not on file     Family History: The patient's family history includes Anxiety disorder in her brother, father, and  sister; Bipolar disorder in her brother; Breast cancer in her cousin; Breast cancer (age of onset: 73) in her maternal aunt; Breast cancer (age of onset: 75) in her maternal grandmother; Colon cancer (age of onset: 44) in her paternal grandmother; Dementia in her father; Depression in her sister; Diabetes in her mother and sister; Heart failure in her father; Heart failure (age of onset: 50) in her maternal grandfather; Mental illness in her paternal grandmother; Mood Disorder in her father; Osteoporosis in her brother; Stomach cancer in her maternal aunt.  ROS:   Review of Systems  Constitution: Negative for decreased appetite, fever and weight gain.  HENT: Negative for congestion, ear discharge, hoarse voice and sore throat.   Eyes: Negative for  discharge, redness, vision loss in right eye and visual halos.  Cardiovascular: Report dyspnea on exertion.  Negative for chest pain, leg swelling, orthopnea and palpitations.  Respiratory: Negative for cough, hemoptysis, shortness of breath and snoring.   Endocrine: Negative for heat intolerance and polyphagia.  Hematologic/Lymphatic: Negative for bleeding problem. Does not bruise/bleed easily.  Skin: Negative for flushing, nail changes, rash and suspicious lesions.  Musculoskeletal: Negative for arthritis, joint pain, muscle cramps, myalgias, neck pain and stiffness.  Gastrointestinal: Negative for abdominal pain, bowel incontinence, diarrhea and excessive appetite.  Genitourinary: Negative for decreased libido, genital sores and incomplete emptying.  Neurological: Negative for brief paralysis, focal weakness, headaches and loss of balance.  Psychiatric/Behavioral: Negative for altered mental status, depression and suicidal ideas.  Allergic/Immunologic: Negative for HIV exposure and persistent infections.    EKGs/Labs/Other Studies Reviewed:    The following studies were reviewed today:   EKG:  The ekg ordered today demonstrates sinus rhythm compared to prior EKG no significant change.   TTE 07/18/2022 MPRESSIONS     1. Mild intracavitary gradient. Peak velocity 1.55 m/s. Peak gradient 9.7  mmHg. Left ventricular ejection fraction, by estimation, is 55 to 60%.  Left ventricular ejection fraction by 2D MOD biplane is 57.5 %. Left  ventricular ejection fraction by PLAX  is 57 %. The left ventricle has normal function. The left ventricle has no  regional wall motion abnormalities. There is mild concentric left  ventricular hypertrophy. Left ventricular diastolic parameters are  consistent with Grade I diastolic dysfunction  (impaired relaxation). The average left ventricular global longitudinal  strain is -19.8 %. The global longitudinal strain is normal.   2. Right ventricular  systolic function is normal. The right ventricular  size is normal. There is normal pulmonary artery systolic pressure.   3. The mitral valve is normal in structure. Trivial mitral valve  regurgitation. No evidence of mitral stenosis.   4. The aortic valve is tricuspid. Aortic valve regurgitation is not  visualized. No aortic stenosis is present.   5. The inferior vena cava is normal in size with greater than 50%  respiratory variability, suggesting right atrial pressure of 3 mmHg.   FINDINGS   Left Ventricle: Mild intracavitary gradient. Peak velocity 1.55 m/s. Peak  gradient 9.7 mmHg. Left ventricular ejection fraction, by estimation, is  55 to 60%. Left ventricular ejection fraction by PLAX is 57 %. Left  ventricular ejection fraction by 2D  MOD biplane is 57.5 %. The left ventricle has normal function. The left  ventricle has no regional wall motion abnormalities. The average left  ventricular global longitudinal strain is -19.8 %. The global longitudinal  strain is normal. The left  ventricular internal cavity size was normal in size. There is mild  concentric left ventricular  hypertrophy. Left ventricular diastolic  parameters are consistent with Grade I diastolic dysfunction (impaired  relaxation). Indeterminate filling pressures.   Right Ventricle: The right ventricular size is normal. No increase in  right ventricular wall thickness. Right ventricular systolic function is  normal. There is normal pulmonary artery systolic pressure. The tricuspid  regurgitant velocity is 2.04 m/s, and   with an assumed right atrial pressure of 3 mmHg, the estimated right  ventricular systolic pressure is 01.0 mmHg.   Left Atrium: Left atrial size was normal in size.   Right Atrium: Right atrial size was normal in size.   Pericardium: There is no evidence of pericardial effusion.   Mitral Valve: The mitral valve is normal in structure. Trivial mitral  valve regurgitation. No evidence of  mitral valve stenosis.   Tricuspid Valve: The tricuspid valve is normal in structure. Tricuspid  valve regurgitation is trivial. No evidence of tricuspid stenosis.   Aortic Valve: The aortic valve is tricuspid. Aortic valve regurgitation is  not visualized. No aortic stenosis is present. Aortic valve mean gradient  measures 6.0 mmHg. Aortic valve peak gradient measures 11.6 mmHg. Aortic  valve area, by VTI measures 2.39   cm.   Pulmonic Valve: The pulmonic valve was normal in structure. Pulmonic valve  regurgitation is not visualized. No evidence of pulmonic stenosis.   Aorta: The aortic root is normal in size and structure.   Venous: The inferior vena cava is normal in size with greater than 50%  respiratory variability, suggesting right atrial pressure of 3 mmHg.   IAS/Shunts: No atrial level shunt detected by color flow Doppler.   TE 09/13/2021 IMPRESSIONS   1. Left ventricular ejection fraction, by estimation, is 55 to 60%. The  left ventricle has normal function. The left ventricle has no regional  wall motion abnormalities. Left ventricular diastolic parameters were  normal. The average left ventricular  global longitudinal strain is -19.7 %. The global longitudinal strain is  normal.   2. Right ventricular systolic function is normal. The right ventricular  size is normal.   3. Left atrial size was mildly dilated.   4. Right atrial size was mildly dilated.   5. There is prolapse of the posterior leaflet MR appears mild but may be  underestimated Chordal SAM with no appreciable LVOT turbulence . The  mitral valve is abnormal. No evidence of mitral valve regurgitation. No  evidence of mitral stenosis. Moderate  mitral annular calcification.   6. The aortic valve is tricuspid. There is mild calcification of the  aortic valve. There is mild thickening of the aortic valve. Aortic valve  regurgitation is mild. Aortic valve sclerosis is present, with no evidence  of aortic  valve stenosis.   7. The inferior vena cava is normal in size with greater than 50%  respiratory variability, suggesting right atrial pressure of 3 mmHg.   FINDINGS   Left Ventricle: Left ventricular ejection fraction, by estimation, is 55  to 60%. The left ventricle has normal function. The left ventricle has no  regional wall motion abnormalities. The average left ventricular global  longitudinal strain is -19.7 %.  The global longitudinal strain is normal. The left ventricular internal  cavity size was normal in size. There is no left ventricular hypertrophy.  Left ventricular diastolic parameters were normal.   Right Ventricle: The right ventricular size is normal. No increase in  right ventricular wall thickness. Right ventricular systolic function is  normal.   Left Atrium: Left atrial size was mildly  dilated.   Right Atrium: Right atrial size was mildly dilated.   Pericardium: There is no evidence of pericardial effusion.   Mitral Valve: There is prolapse of the posterior leaflet MR appears mild  but may be underestimated Chordal SAM with no appreciable LVOT turbulence.  The mitral valve is abnormal. There is mild thickening of the mitral valve  leaflet(s). Moderate mitral  annular calcification. No evidence of mitral valve regurgitation. No  evidence of mitral valve stenosis.   Tricuspid Valve: The tricuspid valve is normal in structure. Tricuspid  valve regurgitation is not demonstrated. No evidence of tricuspid  stenosis.   Aortic Valve: The aortic valve is tricuspid. There is mild calcification  of the aortic valve. There is mild thickening of the aortic valve. Aortic  valve regurgitation is mild. Aortic valve sclerosis is present, with no  evidence of aortic valve stenosis.   Pulmonic Valve: The pulmonic valve was normal in structure. Pulmonic valve  regurgitation is not visualized. No evidence of pulmonic stenosis.   Aorta: The aortic root is normal in size and  structure.   Venous: The inferior vena cava is normal in size with greater than 50%  respiratory variability, suggesting right atrial pressure of 3 mmHg.   IAS/Shunts: No atrial level shunt detected by color flow Doppler.    CCTA 09/19/2021   FINDINGS: Image quality: Excellent.   Noise artifact is: Limited.   Motion artifact present in mid LCx and LAD.   Coronary Arteries:  Normal coronary origin.  Right dominance.   Left main: The left main is a large caliber vessel with a normal take off from the left coronary cusp that bifurcates to form a left anterior descending artery and a left circumflex artery. There is no plaque or stenosis.   Left anterior descending artery: The LAD is patent without evidence of plaque or stenosis. The LAD gives off 2 patent diagonal branches.   Left circumflex artery: The LCX is non-dominant and patent with no evidence of plaque or stenosis. The LCX gives off 2 patent obtuse marginal branches.   Right coronary artery: The RCA is dominant with normal take off from the right coronary cusp. There is no evidence of plaque or stenosis. The RCA terminates as a PDA and right posterolateral branch without evidence of plaque or stenosis.   Right Atrium: Right atrial size is within normal limits.   Right Ventricle: The right ventricular cavity is within normal limits.   Left Atrium: Left atrial size is normal in size with no left atrial appendage filling defect.   Left Ventricle: The ventricular cavity size is within normal limits. There are no stigmata of prior infarction. There is no abnormal filling defect.   Pulmonary arteries: Normal in size without proximal filling defect.   Pulmonary veins: Normal pulmonary venous drainage.   Pericardium: Normal thickness with no significant effusion or calcium present.   Cardiac valves: The aortic valve is trileaflet without significant calcification. The mitral valve is normal structure with mild  mitral annular calcification of the posterior annulus.   Aorta: Normal caliber with no significant disease.   Extra-cardiac findings: See attached radiology report for non-cardiac structures.   IMPRESSION: 1. Coronary calcium score of 0. This was 0 percentile for age-, sex, and race-matched controls.   2.  Normal coronary origin with right dominance.   3.  Normal coronary arteries.  CAD RADS 0.   4.  Consider non cardiac etiologist of chest pain.   RECOMMENDATIONS: 1. CAD-RADS 0: No evidence  of CAD (0%). Consider non-atherosclerotic causes of chest pain.   2. CAD-RADS 1: Minimal non-obstructive CAD (0-24%). Consider non-atherosclerotic causes of chest pain. Consider preventive therapy and risk factor modification.   3. CAD-RADS 2: Mild non-obstructive CAD (25-49%). Consider non-atherosclerotic causes of chest pain. Consider preventive therapy and risk factor modification.   4. CAD-RADS 3: Moderate stenosis. Consider symptom-guided anti-ischemic pharmacotherapy as well as risk factor modification per guideline directed care. Additional analysis with CT FFR will be submitted.   5. CAD-RADS 4: Severe stenosis. (70-99% or > 50% left main). Cardiac catheterization or CT FFR is recommended. Consider symptom-guided anti-ischemic pharmacotherapy as well as risk factor modification per guideline directed care. Invasive coronary angiography recommended with revascularization per published guideline statements.   6. CAD-RADS 5: Total coronary occlusion (100%). Consider cardiac catheterization or viability assessment. Consider symptom-guided anti-ischemic pharmacotherapy as well as risk factor modification per guideline directed care.   7. CAD-RADS N: Non-diagnostic study. Obstructive CAD can't be excluded. Alternative evaluation is recommended.   Fransico Him, MD     Electronically Signed   By: Fransico Him M.D.   On: 09/20/2021 13:17    Addended by Sueanne Margarita, MD  on 09/20/2021  1:19 PM    Study Result   Narrative & Impression  EXAM: OVER-READ INTERPRETATION  CT CHEST   The following report is an over-read performed by radiologist Dr. Suzy Bouchard of [facility], [place] on 09/20/2021. This over-read does not include interpretation of cardiac or coronary anatomy or pathology. The coronary CTA interpretation by the cardiologist is attached.   COMPARISON:  None.   FINDINGS: Limited view of the lung parenchyma demonstrates no suspicious nodularity. Airways are normal.   Limited view of the mediastinum demonstrates no adenopathy. Esophagus normal.   Limited view of the upper abdomen unremarkable.   Limited view of the skeleton and chest wall is unremarkable.   IMPRESSION: No significant extracardiac findings.   Electronically Signed: By: Suzy Bouchard M.D. On: 09/20/2021 11:47    Recent Labs: 07/17/2022: NT-Pro BNP 165 07/24/2022: ALT 38; BUN 19; Creat 1.03; Hemoglobin 12.0; Magnesium 1.8; Platelets 357; Potassium 4.5; Sodium 137; TSH 3.29  Recent Lipid Panel    Component Value Date/Time   CHOL 207 (H) 07/24/2022 0000   CHOL 197 10/15/2021 1101   TRIG 205 (H) 07/24/2022 0000   HDL 60 07/24/2022 0000   HDL 48 10/15/2021 1101   CHOLHDL 3.5 07/24/2022 0000   VLDL 61 (H) 01/20/2017 1006   LDLCALC 115 (H) 07/24/2022 0000    Physical Exam:    VS:  BP 110/66   Pulse 81   Ht 5' 6.5" (1.689 m)   Wt 180 lb 9.6 oz (81.9 kg)   SpO2 96%   BMI 28.71 kg/m     Wt Readings from Last 3 Encounters:  08/07/22 180 lb 9.6 oz (81.9 kg)  07/24/22 185 lb 9.6 oz (84.2 kg)  07/17/22 187 lb 6.4 oz (85 kg)     GEN: Well nourished, well developed in no acute distress HEENT: Normal NECK: No JVD; No carotid bruits LYMPHATICS: No lymphadenopathy CARDIAC: S1S2 noted,RRR, no murmurs, rubs, gallops RESPIRATORY:  bilateral crackles without rales, wheezing or rhonchi  ABDOMEN: Soft, non-tender, non-distended, +bowel sounds, no  guarding. EXTREMITIES: No edema, No cyanosis, no clubbing MUSCULOSKELETAL:  No deformity  SKIN: Warm and dry NEUROLOGIC:  Alert and oriented x 3, non-focal PSYCHIATRIC:  Normal affect, good insight  ASSESSMENT:    1. SOB (shortness of breath)   2. Nonrheumatic  mitral valve regurgitation   3. OSA on CPAP   4. Mixed hyperlipidemia   5. Obesity (BMI 30.0-34.9)   6. Hypertriglyceridemia   7. Prediabetes    PLAN:    She is still short of breath and had not had any response to the Lasix but does not feel to be slightly volume depleted.  Going to stop the Lasix as this is not beneficial for her.  With the dry crackles I would like to refer the patient to pulmonary to rule out any interstitial lung disease. Her echocardiogram does not suggest any reason for her shortness of breath.  Continue with CPAP.  Hyperlipidemia - continue with current statin medication.  Blood pressure is acceptable, continue with current antihypertensive regimen.   The patient is in agreement with the above plan. The patient left the office in stable condition.  The patient will follow up in 1 year or sooner if needed.   Medication Adjustments/Labs and Tests Ordered: Current medicines are reviewed at length with the patient today.  Concerns regarding medicines are outlined above.  Orders Placed This Encounter  Procedures   Ambulatory referral to Pulmonology   No orders of the defined types were placed in this encounter.   Patient Instructions  Medication Instructions:  Your physician has recommended you make the following change in your medication:  STOP: Lasix and Potassium  *If you need a refill on your cardiac medications before your next appointment, please call your pharmacy*   Lab Work: NONE If you have labs (blood work) drawn today and your tests are completely normal, you will receive your results only by: [place] (if you have MyChart) OR A paper copy in the mail If you have any  lab test that is abnormal or we need to change your treatment, we will call you to review the results.   Testing/Procedures: NONE   Follow-Up: At [facility], you and your health needs are our priority.  As part of our continuing mission to provide you with exceptional heart care, we have created designated Provider Care Teams.  These Care Teams include your primary Cardiologist (physician) and Advanced Practice Providers (APPs -  Physician Assistants and Nurse Practitioners) who all work together to provide you with the care you need, when you need it.  We recommend signing up for the patient portal called "MyChart".  Sign up information is provided on this After Visit Summary.  MyChart is used to connect with patients for Virtual Visits (Telemedicine).  Patients are able to view lab/test results, encounter notes, upcoming appointments, etc.  Non-urgent messages can be sent to your provider as well.   To learn more about what you can do with MyChart, go to [email or web address].    Your next appointment:   1 year(s)  The format for your next appointment:   In Person  Provider:   Berniece Salines, DO     Adopting a Healthy Lifestyle.  Know what a healthy weight is for you (roughly BMI <25) and aim to maintain this   Aim for 7+ servings of fruits and vegetables daily   65-80+ fluid ounces of water or unsweet tea for healthy kidneys   Limit to max 1 drink of alcohol per day; avoid smoking/tobacco   Limit animal fats in diet for cholesterol and heart health - choose grass fed whenever available   Avoid highly processed foods, and foods high in saturated/trans fats   Aim for low stress - take time to unwind and care for  your mental health   Aim for 150 min of moderate intensity exercise weekly for heart health, and weights twice weekly for bone health   Aim for 7-9 hours of sleep daily   When it comes to diets, agreement about the perfect plan isnt easy to find, even  among the experts. Experts at the [employer] developed an idea known as the Healthy Eating Plate. Just imagine a plate divided into logical, healthy portions.   The emphasis is on diet quality:   Load up on vegetables and fruits - one-half of your plate: Aim for color and variety, and remember that potatoes dont count.   Go for whole grains - one-quarter of your plate: Whole wheat, barley, wheat berries, quinoa, oats, brown rice, and foods made with them. If you want pasta, go with whole wheat pasta.   Protein power - one-quarter of your plate: Fish, chicken, beans, and nuts are all healthy, versatile protein sources. Limit red meat.   The diet, however, does go beyond the plate, offering a few other suggestions.   Use healthy plant oils, such as olive, canola, soy, corn, sunflower and peanut. Check the labels, and avoid partially hydrogenated oil, which have unhealthy trans fats.   If youre thirsty, drink water. Coffee and tea are good in moderation, but skip sugary drinks and limit milk and dairy products to one or two daily servings.   The type of carbohydrate in the diet is more important than the amount. Some sources of carbohydrates, such as vegetables, fruits, whole grains, and beans-are healthier than others.   Finally, stay active  Signed, Berniece Salines, DO  08/09/2022 1:56 PM    [facility]

## 2022-08-15 DIAGNOSIS — M25561 Pain in right knee: Secondary | ICD-10-CM | POA: Diagnosis not present

## 2022-08-17 DIAGNOSIS — E782 Mixed hyperlipidemia: Secondary | ICD-10-CM | POA: Diagnosis not present

## 2022-08-17 DIAGNOSIS — F339 Major depressive disorder, recurrent, unspecified: Secondary | ICD-10-CM | POA: Diagnosis not present

## 2022-08-17 DIAGNOSIS — E669 Obesity, unspecified: Secondary | ICD-10-CM | POA: Diagnosis not present

## 2022-08-17 DIAGNOSIS — E781 Pure hyperglyceridemia: Secondary | ICD-10-CM | POA: Diagnosis not present

## 2022-08-19 ENCOUNTER — Ambulatory Visit: Payer: Medicare Other | Admitting: Neurology

## 2022-08-22 DIAGNOSIS — M25561 Pain in right knee: Secondary | ICD-10-CM | POA: Diagnosis not present

## 2022-08-24 DIAGNOSIS — R051 Acute cough: Secondary | ICD-10-CM | POA: Diagnosis not present

## 2022-08-24 DIAGNOSIS — J00 Acute nasopharyngitis [common cold]: Secondary | ICD-10-CM | POA: Diagnosis not present

## 2022-08-26 ENCOUNTER — Encounter: Payer: Self-pay | Admitting: Nurse Practitioner

## 2022-08-26 ENCOUNTER — Ambulatory Visit (INDEPENDENT_AMBULATORY_CARE_PROVIDER_SITE_OTHER): Payer: Medicare Other | Admitting: Nurse Practitioner

## 2022-08-26 ENCOUNTER — Other Ambulatory Visit: Payer: Self-pay

## 2022-08-26 VITALS — BP 108/70 | HR 94 | Temp 97.9°F | Ht 66.5 in | Wt 182.2 lb

## 2022-08-26 DIAGNOSIS — R0989 Other specified symptoms and signs involving the circulatory and respiratory systems: Secondary | ICD-10-CM | POA: Diagnosis not present

## 2022-08-26 DIAGNOSIS — E782 Mixed hyperlipidemia: Secondary | ICD-10-CM

## 2022-08-26 DIAGNOSIS — R6889 Other general symptoms and signs: Secondary | ICD-10-CM | POA: Diagnosis not present

## 2022-08-26 DIAGNOSIS — Z1152 Encounter for screening for COVID-19: Secondary | ICD-10-CM | POA: Diagnosis not present

## 2022-08-26 DIAGNOSIS — J309 Allergic rhinitis, unspecified: Secondary | ICD-10-CM | POA: Diagnosis not present

## 2022-08-26 DIAGNOSIS — J069 Acute upper respiratory infection, unspecified: Secondary | ICD-10-CM | POA: Diagnosis not present

## 2022-08-26 LAB — POCT INFLUENZA A/B
Influenza A, POC: NEGATIVE
Influenza B, POC: NEGATIVE

## 2022-08-26 LAB — POC COVID19 BINAXNOW: SARS Coronavirus 2 Ag: NEGATIVE

## 2022-08-26 MED ORDER — ALBUTEROL SULFATE HFA 108 (90 BASE) MCG/ACT IN AERS: 2.0000 | INHALATION_SPRAY | Freq: Four times a day (QID) | RESPIRATORY_TRACT | 2 refills | Status: DC | PRN

## 2022-08-26 MED ORDER — REPATHA 140 MG/ML ~~LOC~~ SOSY: 1.0000 mL | PREFILLED_SYRINGE | SUBCUTANEOUS | 6 refills | Status: DC

## 2022-08-26 MED ORDER — PREDNISONE 20 MG PO TABS: ORAL_TABLET | ORAL | 0 refills | Status: AC

## 2022-08-26 MED ORDER — AZITHROMYCIN 250 MG PO TABS: ORAL_TABLET | ORAL | 1 refills | Status: DC

## 2022-08-26 MED ORDER — BENZONATATE 100 MG PO CAPS: 100.0000 mg | ORAL_CAPSULE | Freq: Four times a day (QID) | ORAL | 1 refills | Status: DC | PRN

## 2022-08-26 MED ORDER — FLUTICASONE PROPIONATE 50 MCG/ACT NA SUSP: NASAL | 2 refills | Status: DC

## 2022-08-26 NOTE — Patient Instructions (Signed)
Start generic Zyrtec daily for 2-3 weeks.  Begin Flonase 2 sprays each nostril once a day for 2-3 weeks  Tessalon perles during the day every 6 hours for cough and Robitussin DM at night  Prednisone 3 tabs x 3 days, 2 tabs x 3 days and 1 tab x 5 days  Zithromax 2 tabs day 1 then 1 tab daily for 4 more days  If no improvement by Friday notify the office  Upper Respiratory Infection, Adult An upper respiratory infection (URI) is a common viral infection of the nose, throat, and upper air passages that lead to the lungs. The most common type of URI is the common cold. URIs usually get better on their own, without medical treatment. What are the causes? A URI is caused by a virus. You may catch a virus by: Breathing in droplets from an infected person's cough or sneeze. Touching something that has been exposed to the virus (is contaminated) and then touching your mouth, nose, or eyes. What increases the risk? You are more likely to get a URI if: You are very young or very old. You have close contact with others, such as at work, school, or a health care facility. You smoke. You have long-term (chronic) heart or lung disease. You have a weakened disease-fighting system (immune system). You have nasal allergies or asthma. You are experiencing a lot of stress. You have poor nutrition. What are the signs or symptoms? A URI usually involves some of the following symptoms: Runny or stuffy (congested) nose. Cough. Sneezing. Sore throat. Headache. Fatigue. Fever. Loss of appetite. Pain in your forehead, behind your eyes, and over your cheekbones (sinus pain). Muscle aches. Redness or irritation of the eyes. Pressure in the ears or face. How is this diagnosed? This condition may be diagnosed based on your medical history and symptoms, and a physical exam. Your health care provider may use a swab to take a mucus sample from your nose (nasal swab). This sample can be tested to determine  what virus is causing the illness. How is this treated? URIs usually get better on their own within 7-10 days. Medicines cannot cure URIs, but your health care provider may recommend certain medicines to help relieve symptoms, such as: Over-the-counter cold medicines. Cough suppressants. Coughing is a type of defense against infection that helps to clear the respiratory system, so take these medicines only as recommended by your health care provider. Fever-reducing medicines. Follow these instructions at home: Activity Rest as needed. If you have a fever, stay home from work or school until your fever is gone or until your health care provider says your URI cannot spread to other people (is no longer contagious). Your health care provider may have you wear a face mask to prevent your infection from spreading. Relieving symptoms Gargle with a mixture of salt and water 3-4 times a day or as needed. To make salt water, completely dissolve -1 tsp (3-6 g) of salt in 1 cup (237 mL) of warm water. Use a cool-mist humidifier to add moisture to the air. This can help you breathe more easily. Eating and drinking  Drink enough fluid to keep your urine pale yellow. Eat soups and other clear broths. General instructions  Take over-the-counter and prescription medicines only as told by your health care provider. These include cold medicines, fever reducers, and cough suppressants. Do not use any products that contain nicotine or tobacco. These products include cigarettes, chewing tobacco, and vaping devices, such as e-cigarettes. If you  need help quitting, ask your health care provider. Stay away from secondhand smoke. Stay up to date on all immunizations, including the yearly (annual) flu vaccine. Keep all follow-up visits. This is important. How to prevent the spread of infection to others URIs can be contagious. To prevent the infection from spreading: Wash your hands with soap and water for at least  20 seconds. If soap and water are not available, use hand sanitizer. Avoid touching your mouth, face, eyes, or nose. Cough or sneeze into a tissue or your sleeve or elbow instead of into your hand or into the air.  Contact a health care provider if: You are getting worse instead of better. You have a fever or chills. Your mucus is brown or red. You have yellow or brown discharge coming from your nose. You have pain in your face, especially when you bend forward. You have swollen neck glands. You have pain while swallowing. You have white areas in the back of your throat. Get help right away if: You have shortness of breath that gets worse. You have severe or persistent: Headache. Ear pain. Sinus pain. Chest pain. You have chronic lung disease along with any of the following: Making high-pitched whistling sounds when you breathe, most often when you breathe out (wheezing). Prolonged cough (more than 14 days). Coughing up blood. A change in your usual mucus. You have a stiff neck. You have changes in your: Vision. Hearing. Thinking. Mood. These symptoms may be an emergency. Get help right away. Call 911. Do not wait to see if the symptoms will go away. Do not drive yourself to the hospital. Summary An upper respiratory infection (URI) is a common infection of the nose, throat, and upper air passages that lead to the lungs. A URI is caused by a virus. URIs usually get better on their own within 7-10 days. Medicines cannot cure URIs, but your health care provider may recommend certain medicines to help relieve symptoms. This information is not intended to replace advice given to you by your health care provider. Make sure you discuss any questions you have with your health care provider. Document Revised: 03/06/2021 Document Reviewed: 03/06/2021 Elsevier Patient Education  [place].

## 2022-08-26 NOTE — Progress Notes (Signed)
Assessment and Plan:   Kimberly Ray was seen today for acute visit.  Diagnoses and all orders for this visit:  Flu-like symptoms -     POCT Influenza A/B- negative  Encounter for screening for COVID-19 -     POC COVID-19- negative  Labile hypertension - continue DASH diet, exercise and monitor at home. Call if greater than 130/80.   URI, acute -Start generic Zyrtec daily for 2-3 weeks.  Begin Flonase 2 sprays each nostril once a day for 2-3 weeks       -Tessalon perles during the day every 6 hours for cough and Robitussin DM at night       - Prednisone 3 tabs x 3 days, 2 tabs x 3 days and 1 tab x 5 days       - Zithromax 2 tabs day 1 then 1 tab daily for 4 more days If no improvement by Friday notify the office -     predniSONE (DELTASONE) 20 MG tablet; 3 tablets daily with food for 3 days, 2 tabs daily for 3 days, 1 tab a day for 5 days. -     albuterol (VENTOLIN HFA) 108 (90 Base) MCG/ACT inhaler; Inhale 2 puffs into the lungs every 6 (six) hours as needed for wheezing or shortness of breath. -     benzonatate (TESSALON PERLES) 100 MG capsule; Take 1 capsule (100 mg total) by mouth every 6 (six) hours as needed for cough. -     azithromycin (ZITHROMAX) 250 MG tablet; Take 2 tablets (500 mg) on  Day 1,  followed by 1 tablet (250 mg) once daily on Days 2 through 5.  Allergic rhinitis, unspecified seasonality, unspecified trigger -     fluticasone (FLONASE) 50 MCG/ACT nasal spray; 2 prays each nostril once a day      Further disposition pending results of labs. Discussed med's effects and SE's.   Over 30 minutes of exam, counseling, chart review, and critical decision making was performed.   Future Appointments  Date Time Provider [place]  08/26/2022 10:00 AM Alycia Rossetti, NP [facility] None  10/24/2022 11:00 AM Darrol Jump, NP [facility] None  12/30/2022  2:45 PM [name], NP [facility] None  01/30/2023 10:30 AM Unk Pinto, MD [facility] None  05/06/2023  10:00 AM Darrol Jump, NP [facility] None    ------------------------------------------------------------------------------------------------------------------   HPI BP 108/70   Pulse 94   Temp 97.9 F (36.6 C)   Ht 5' 6.5" (1.689 m)   Wt 182 lb 3.2 oz (82.6 kg)   SpO2 95%   BMI 28.97 kg/m    72 y.o.female presents for congestion, sore throat and productive cough of white mucus that began 5 days ago .  Coughing worst in the morning and when lying down at night.  Denies nausea, vomiting, fever.  She has tried Mucinex but dried her out too much and Robitussin DM is not really helping. Covid and flu are negative at office today.    BP is currently well controlled without medications.  Denies chest pain, shortness of breath and dizziness.  BP Readings from Last 3 Encounters:  08/26/22 108/70  08/07/22 110/66  07/24/22 112/70   BMI is Body mass index is 28.97 kg/m., she has not been working on diet and exercise. Wt Readings from Last 3 Encounters:  08/26/22 182 lb 3.2 oz (82.6 kg)  08/07/22 180 lb 9.6 oz (81.9 kg)  07/24/22 185 lb 9.6 oz (84.2 kg)     Past Medical History:  Diagnosis Date   Adnexal mass 04/08/2020   Anemia    Depression    Extrinsic asthma 11/09/2014   Gait abnormality 12/26/2019   GERD (gastroesophageal reflux disease)    Hay fever    Head trauma    IBS (irritable bowel syndrome)    Memory loss    Migraines    OSA on CPAP    Reflux    RLS (restless legs syndrome)    Sinus congestion      Allergies  Allergen Reactions   Erythromycin Other (See Comments)    TBS   Iodinated Contrast Media Other (See Comments)    Hot flashes, itching scalp Hot flashes, itching scalp   Penicillins Hives   Clarithromycin Other (See Comments)    Itching ankles   Brintellix [Vortioxetine]     Itching   Cholestatin     Current Outpatient Medications on File Prior to Visit  Medication Sig   acetaminophen (TYLENOL) 650 MG CR tablet Take 650 mg by mouth every 8  (eight) hours as needed for pain.   ASPIRIN 81 PO Aspirin   cholecalciferol (VITAMIN D3) 25 MCG (1000 UNIT) tablet Take 4,000 Units by mouth daily.   clindamycin (CLEOCIN) 300 MG capsule    Cyanocobalamin 2500 MCG SUBL Place 1 tablet under the tongue daily.   ezetimibe (ZETIA) 10 MG tablet Take 10 mg by mouth daily.   FLUoxetine (PROZAC) 10 MG capsule Take 2 capsules ( 20 mg) Daily for Mood                           /                                                      Take                               by                          mouth   guaiFENesin 200 MG tablet Take by mouth.   lamoTRIgine (LAMICTAL) 150 MG tablet Take  1 tablet  Daily  for  Mood                          /                                                      Take                               by                          mouth   methylphenidate 18 MG PO CR tablet Take 18 mg by mouth daily.   ondansetron (ZOFRAN-ODT) 8 MG disintegrating tablet DISSOLVE 1 TABLET ON THE  TONGUE EVERY 8 HOURS AS  NEEDED FOR NAUSEA AND  VOMITING   Oxymetazoline HCl (NASAL SPRAY  NA) Place into the nose.   pantoprazole (PROTONIX) 40 MG tablet Take  1 tablet 2 x / day  to Prevent Heartburn &  Indigestion   sodium chloride (OCEAN) 0.65 % nasal spray Place into the nose.   SUCRALFATE PO Take 1 g by mouth as needed.   SUMAtriptan (IMITREX) 25 MG tablet Take 1-2 tab with onset of migraine. May repeat in 2 hours if headache persists. Max 4 tabs in 24 hours.   triamcinolone cream (KENALOG) 0.1 %    Current Facility-Administered Medications on File Prior to Visit  Medication   Evolocumab SOAJ 140 mg    ROS: all negative except above.   Physical Exam:  BP 108/70   Pulse 94   Temp 97.9 F (36.6 C)   Ht 5' 6.5" (1.689 m)   Wt 182 lb 3.2 oz (82.6 kg)   SpO2 95%   BMI 28.97 kg/m   General Appearance: Well nourished, in no apparent distress. Eyes: PERRLA, EOMs, conjunctiva no swelling or erythema Sinuses: No Frontal/maxillary  tenderness ENT/Mouth: Ext aud canals clear, TMs without erythema, bulging. No erythema, swelling, or exudate on post pharynx.  Nasal mucosa pale Hearing normal.  Neck: Supple, thyroid normal.  Respiratory: Respiratory effort normal, BS equal bilaterally without rales, rhonchi, wheezing or stridor.  Cardio: RRR with no MRGs. Brisk peripheral pulses without edema.  Abdomen: Soft, + BS.  Non tender, no guarding, rebound, hernias, masses. Lymphatics: positive right submandibular adenoapthy Musculoskeletal: Full ROM, 5/5 strength, normal gait.  Skin: Warm, dry without rashes, lesions, ecchymosis.  Neuro: Cranial nerves intact. Normal muscle tone, no cerebellar symptoms. Sensation intact.  Psych: Awake and oriented X 3, normal affect, Insight and Judgment appropriate.     Alycia Rossetti, NP 9:55 AM [facility]

## 2022-09-02 ENCOUNTER — Ambulatory Visit: Payer: Medicare Other | Admitting: Neurology

## 2022-09-02 ENCOUNTER — Encounter: Payer: Self-pay | Admitting: Neurology

## 2022-09-02 VITALS — BP 145/81 | HR 77 | Ht 66.0 in | Wt 182.0 lb

## 2022-09-02 DIAGNOSIS — R413 Other amnesia: Secondary | ICD-10-CM | POA: Diagnosis not present

## 2022-09-02 DIAGNOSIS — R269 Unspecified abnormalities of gait and mobility: Secondary | ICD-10-CM | POA: Diagnosis not present

## 2022-09-02 DIAGNOSIS — G2581 Restless legs syndrome: Secondary | ICD-10-CM

## 2022-09-02 NOTE — Patient Instructions (Signed)
Please bring your CPAP by for a download  Memory appears stable See you back in 1 year

## 2022-09-02 NOTE — Progress Notes (Addendum)
PATIENT: Kimberly Ray DOB: 1950/12/20  REASON FOR VISIT: follow up HISTORY FROM: patient Primary Neurologist: Dr. Brett Fairy since Dr. Jannifer Franklin has retired   HISTORY OF PRESENT ILLNESS: Today 09/02/22 MMSE 26/30. Forgot her CPAP today. ESS 21. Seeing therapist, working on medications. Had right knee replacement it went well. RLS are very infrequent. Has increased her magnesium. Having urinary incontinence issues, has yet to see urology. On Prozac, Lamictal, Ritalin for the mood, she can't tell a difference, she is tired. Uses CPAP nightly. Brother thinks her memory is age related. Considered repeat neuro psych eval this year, initial testing in Jan 2022. Uses nasal mask for CPAP. Addendum 10/02/2022 SS: Data from CPAP 09/01/22-09/30/22 showed usage 28/30 days at 93%, greater than 4 hours 70%, average usage 5 hours 25 minutes, set pressure 7, leak 27.1, AHI 3. 90-day data 07/03/22-09/30/22 75/90 days 83%, greater than 4 hours 76%, leak 26.6, AHI 1.6.  Update 08/15/21 SS: Kimberly Ray is a 72 year old female with multiple complaints here with follow-up:  CPAP: Doing well, has 2 machines, she uses 1 at home, 1 when she goes to her mothers. Has 2 machines to review data from; overall she has about 90% compliance, AHI is 0.6, no significant leak from the mask. Machine is working well.  Can't imagine how she'd feel without it. ESS 18 (mood disorder contributing to responses).  Depression: Hasn't gone to psychiatry, looking for recommendation. [facility], didn't feel comfortable, so no medications changed. PCP started Abilify, dose recently increased, depression improving some, less sleepy. Today is a good day. Slept 8 hours with CPAP on.   RLS: Off Requip, just increased Abilify, few jumps in the legs, still on gabapentin. No more hallucinations.   Others: SOB with exertion, related to wearing mask? Palpations, heart fluttering, almost daily. Started PT for  torn right knee mensicus. Joined a gym, but hasn't been going lately. MMSE 27/30 today.   Update 10/25/2020 SS: Memory: She had neuropsychological evaluation with Dr. Nicole Kindred, felt the issue was more related to executive control function, could be due to depression, aging, attention deficit disorder.  Felt neurodegeneration is less likely, can follow-up in 1 to 2 years. MRI of brain has been essentially normal for age.   MMSE 29/30 today. Is emotional today.  Depression: Recommended to see therapist from Dr. Nicole Kindred. [facility]. Has been taken off Cymbalta; now just on Lamictal and Wellbutrin. Is going through grief, family stress.  RLS: She stopped Requip, switched to gabapentin for RLS. Transition was rough, afterwards, zero restless leg symptoms. On gabapentin 300 mg qhs.  CPAP: uses faithfully every night, needs more supplies, uses machine with chip   Gait, balance: much better off requip, looking at joining gym, not falling  Hallucinations: better since of Requip  HISTORY 12/26/2019 Dr. Jannifer Franklin: Kimberly Ray is a 72 year old right-handed white female with a history of diabetes and restless leg syndrome.  Restless leg syndrome runs in her family, her father and her brother have/had similar issues.  Over the last 5 or 6 years, she has noted some mild changes in memory.  She has had occasional events which she cannot figure out how things work, she may have some difficulty with remembering recent events or remembering names for people.  She over the last year has had occasional events where she may smell of tobacco smoke when there is none.  She has had events where she may see a vague object  in her right peripheral vision but when she looks she sees nothing.  She sometimes thinks it is a mouse or a person, but she never actually sees the animal or person, she just has a sensation that that is what it is.  The patient is on Requip taking 5 mg at night, she has not had any recent  increase in her dosing.  She has been placed on Seroquel.  The patient has also noted some change in balance, she will stumble on occasion, she fell last summer and fractured the left ankle.  She denies any numbness in her feet associated with her diabetes.  She has occasional headaches, she denies significant issues controlling the bowels or the bladder, she occasionally will have stress incontinence of the bladder.  She claims that her father also had memory problems before he died.  Occasionally when she stands up she may have a bit of a tremor involving the legs, occasionally a jaw tremor or tremor involving the left shoulder.  Currently, the patient is able to keep up with her medications and appointments but she is now using a pill dispenser.  She is able to operate a motor vehicle, she denies any issues getting lost.  She is sent to this office for further evaluation.  REVIEW OF SYSTEMS: Out of a complete 14 system review of symptoms, the patient complains only of the following symptoms, and all other reviewed systems are negative.  See HPI  ALLERGIES: Allergies  Allergen Reactions   Erythromycin Other (See Comments)    TBS   Iodinated Contrast Media Other (See Comments)    Hot flashes, itching scalp Hot flashes, itching scalp   Penicillins Hives   Clarithromycin Other (See Comments)    Itching ankles   Brintellix [Vortioxetine]     Itching   Cholestatin     HOME MEDICATIONS: Outpatient Medications Prior to Visit  Medication Sig Dispense Refill   acetaminophen (TYLENOL) 650 MG CR tablet Take 650 mg by mouth every 8 (eight) hours as needed for pain.     albuterol (VENTOLIN HFA) 108 (90 Base) MCG/ACT inhaler Inhale 2 puffs into the lungs every 6 (six) hours as needed for wheezing or shortness of breath. 8 g 2   ASPIRIN 81 PO Aspirin     azithromycin (ZITHROMAX) 250 MG tablet Take 2 tablets (500 mg) on  Day 1,  followed by 1 tablet (250 mg) once daily on Days 2 through 5. 6 each 1    benzonatate (TESSALON PERLES) 100 MG capsule Take 1 capsule (100 mg total) by mouth every 6 (six) hours as needed for cough. 30 capsule 1   cholecalciferol (VITAMIN D3) 25 MCG (1000 UNIT) tablet Take 4,000 Units by mouth daily.     clindamycin (CLEOCIN) 300 MG capsule      Cyanocobalamin 2500 MCG SUBL Place 1 tablet under the tongue daily.     Evolocumab (REPATHA) 140 MG/ML SOSY Inject 1 mL into the skin every 14 (fourteen) days. 2.1 mL 6   ezetimibe (ZETIA) 10 MG tablet Take 10 mg by mouth daily.     FLUoxetine (PROZAC) 10 MG capsule Take 2 capsules ( 20 mg) Daily for Mood                           /  Take                               by                          mouth 180 capsule 3   fluticasone (FLONASE) 50 MCG/ACT nasal spray 2 prays each nostril once a day 16 g 2   lamoTRIgine (LAMICTAL) 150 MG tablet Take  1 tablet  Daily  for  Mood                          /                                                      Take                               by                          mouth 90 tablet 3   methylphenidate 18 MG PO CR tablet Take 18 mg by mouth daily.     ondansetron (ZOFRAN-ODT) 8 MG disintegrating tablet DISSOLVE 1 TABLET ON THE  TONGUE EVERY 8 HOURS AS  NEEDED FOR NAUSEA AND  VOMITING 60 tablet 0   Oxymetazoline HCl (NASAL SPRAY NA) Place into the nose.     pantoprazole (PROTONIX) 40 MG tablet Take  1 tablet 2 x / day  to Prevent Heartburn &  Indigestion 180 tablet 3   predniSONE (DELTASONE) 20 MG tablet 3 tablets daily with food for 3 days, 2 tabs daily for 3 days, 1 tab a day for 5 days. 20 tablet 0   sodium chloride (OCEAN) 0.65 % nasal spray Place into the nose.     SUCRALFATE PO Take 1 g by mouth as needed.     SUMAtriptan (IMITREX) 25 MG tablet Take 1-2 tab with onset of migraine. May repeat in 2 hours if headache persists. Max 4 tabs in 24 hours. 30 tablet 0   triamcinolone cream (KENALOG) 0.1 %      No facility-administered  medications prior to visit.    PAST MEDICAL HISTORY: Past Medical History:  Diagnosis Date   Adnexal mass 04/08/2020   Anemia    Depression    Extrinsic asthma 11/09/2014   Gait abnormality 12/26/2019   GERD (gastroesophageal reflux disease)    Hay fever    Head trauma    IBS (irritable bowel syndrome)    Memory loss    Migraines    OSA on CPAP    Reflux    RLS (restless legs syndrome)    Sinus congestion     PAST SURGICAL HISTORY: Past Surgical History:  Procedure Laterality Date   ABDOMINAL HYSTERECTOMY     BREAST BIOPSY Right 02/27/2014   Stereo- Benign   CHOLECYSTECTOMY  11/03/2017   Dr. Rosendo Gros   LAPAROSCOPIC HYSTERECTOMY  12/2017   NASAL SINUS SURGERY     ORIF ANKLE FRACTURE BIMALLEOLAR Left 03/14/2019   [facility], Dr. Sheran Lawless   perforated eardrum     left side   TONSILLECTOMY AND ADENOIDECTOMY  URETEROSCOPY     for ureteral stone    FAMILY HISTORY: Family History  Problem Relation Age of Onset   Diabetes Mother    Heart failure Father    Dementia Father    Mood Disorder Father    Anxiety disorder Father    Diabetes Sister    Anxiety disorder Sister    Depression Sister    Breast cancer Maternal Grandmother 57   Breast cancer Maternal Aunt 45   Breast cancer Cousin    Osteoporosis Brother    Bipolar disorder Brother    Anxiety disorder Brother    Heart failure Maternal Grandfather 49   Colon cancer Paternal Grandmother 57   Mental illness Paternal Grandmother    Stomach cancer Maternal Aunt     SOCIAL HISTORY: Social History   Socioeconomic History   Marital status: Divorced    Spouse name: Not on file   Number of children: 2   Years of education: 13   Highest education level: Not on file  Occupational History    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 Patient is flight attendant. Patient has college education one year      Right handed.   Caffeine- Rare.    Social Determinants of Health   Financial Resource Strain: Not on file  Food Insecurity: Not on file  Transportation Needs: Not on file  Physical Activity: Inactive (04/06/2018)   Exercise Vital Sign    Days of Exercise per Week: 0 days    Minutes of Exercise per Session: 0 min  Stress: No Stress Concern Present (04/06/2018)   [place]    Feeling of Stress : Only a little  Social Connections: Not on file  Intimate Partner Violence: Not on file   PHYSICAL EXAM  Vitals:   09/02/22 1419  BP: (!) 145/81  Pulse: 77  Weight: 182 lb (82.6 kg)  Height: 5' 6"$  (1.676 m)   Body mass index is 29.38 kg/m.  Generalized: Well developed, in no acute distress     09/02/2022    2:21 PM 08/15/2021    9:17 AM 10/25/2020    1:12 PM  MMSE - Mini Mental State Exam  Orientation to time 4 5 5  $ Orientation to Place 5 5 5  $ Registration 3 3 3  $ Attention/ Calculation 2 2 4  $ Recall 3 3 3  $ Language- name 2 objects 2 2 2  $ Language- repeat 1 1 1  $ Language- follow 3 step command 3 3 3  $ Language- read & follow direction 1 1 1  $ Write a sentence 1 1 1  $ Copy design 1 1 1  $ Total score 26 27 29   $ Neurological examination  Mentation: Alert oriented to time, place, history taking. Follows all commands speech and language fluent.  Very pleasant. Cranial nerve II-XII: Pupils were equal round reactive to light. Extraocular movements were full, visual field were full on confrontational test. Facial sensation and strength were normal. Head  turning and shoulder shrug  were normal and symmetric. Motor: The  motor testing reveals 5 over 5 strength of all 4 extremities. Good symmetric motor tone is noted throughout.   Sensory: Sensory testing is intact to soft touch on all 4 extremities. No evidence of extinction is noted.  Coordination: Cerebellar testing reveals good finger-nose-finger and heel-to-shin bilaterally.  Gait and station: Gait is normal,  Reflexes: Deep tendon reflexes are symmetric and normal bilaterally.   DIAGNOSTIC DATA (LABS, IMAGING, TESTING) - I reviewed patient records, labs, notes, testing and imaging myself where available.  Lab Results  Component Value Date   WBC 7.3 07/24/2022   HGB 12.0 07/24/2022   HCT 37.7 07/24/2022   MCV 84.7 07/24/2022   PLT 357 07/24/2022      Component Value Date/Time   NA 137 07/24/2022 0000   NA 141 07/17/2022 1211   K 4.5 07/24/2022 0000   CL 99 07/24/2022 0000   CO2 24 07/24/2022 0000   GLUCOSE 148 (H) 07/24/2022 0000   BUN 19 07/24/2022 0000   BUN 25 07/17/2022 1211   CREATININE 1.03 (H) 07/24/2022 0000   CALCIUM 9.6 07/24/2022 0000   PROT 6.9 07/24/2022 0000   PROT 6.6 07/17/2022 1211   ALBUMIN 4.4 07/17/2022 1211   AST 47 (H) 07/24/2022 0000   ALT 38 (H) 07/24/2022 0000   ALKPHOS 94 07/17/2022 1211   BILITOT 0.4 07/24/2022 0000   BILITOT 0.2 07/17/2022 1211   GFRNONAA 50 (L) 11/29/2020 1538   GFRAA 58 (L) 11/29/2020 1538   Lab Results  Component Value Date   CHOL 207 (H) 07/24/2022   HDL 60 07/24/2022   LDLCALC 115 (H) 07/24/2022   TRIG 205 (H) 07/24/2022   CHOLHDL 3.5 07/24/2022   Lab Results  Component Value Date   HGBA1C 6.2 (H) 07/24/2022   Lab Results  Component Value Date   VITAMINB12 1,022 12/12/2021   Lab Results  Component Value Date   TSH 3.29 07/24/2022   ASSESSMENT AND PLAN 72 y.o. year old female  has a past medical history of Adnexal mass (04/08/2020), Anemia, Depression, Extrinsic asthma (11/09/2014), Gait abnormality (12/26/2019), GERD (gastroesophageal reflux disease), Hay fever, Head  trauma, IBS (irritable bowel syndrome), Memory loss, Migraines, OSA on CPAP, Reflux, RLS (restless legs syndrome), and Sinus congestion. here with:  1.  Mild memory disturbance, MMSE 27/30 today -Had neuropsychological evaluation with Dr. Nicole Kindred Jan 2022, felt the issue was more related to executive control function, could be due to depression, aging, attention deficit disorder.  Felt neurodegeneration is less likely, can follow-up in 1-2 years (2024) if needed -Memory appears overall stable MMSE 26/30 will continue to monitor  2.  Restless leg syndrome 3.  Visual/olfactory hallucinations -Well controlled, no longer hallucinating since Requip d/c  4.  Gait disturbance 5.  Depression -Psychiatry, now on Lamictal, Ritalin, Prozac  6.  OSA on CPAP -Will bring CPAP by for download, will then addend note with data  Butler Denmark, AGNP-C, DNP 09/02/2022, 2:42 PM [facility] [address], [place], [place] [address] [phone]

## 2022-09-03 ENCOUNTER — Telehealth: Payer: Self-pay

## 2022-09-03 NOTE — Telephone Encounter (Signed)
Prior auth approved.   Prior auth for Rapatha completed and submitted.

## 2022-09-08 NOTE — Progress Notes (Signed)
Assessment and Plan: Kimberly Ray was seen today for acute visit.  Diagnoses and all orders for this visit:  Stage 3a chronic kidney disease ([place]) Push fluids as tolerated  Urge incontinence of urine Started on Myrbetriq 25 mg - given samples Will get urine to rule out UTI F/U in 2 weeks, if no improvement in symptoms with use of Myrbetriq will refer to urogynecologist for further evaluation -     Urine Culture -     Urinalysis, Routine w reflex microscopic   Shortness of breath on exertion Started on Trelegy 1 puff daily, rinse mouth after use Continue to use Albuterol 2 puffs every 6 hours as needed If Shortness Of Breath worsens when resting, start to feel light headed or faint, develop pain with breathing or pain in chest go to the ER -     Ambulatory referral to Pulmonology -     D-dimer, quantitative  Tachycardia Normal EKG with normal pulse rate on exam today Continue to follow with cardiology - EKG  Hyperlipidemia Pt has been started on Repatha- first injection given in office today and pt verbalizes understanding of use Continue diet and exercise      Further disposition pending results of labs. Discussed med's effects and SE's.   Over 30 minutes of exam, counseling, chart review, and critical decision making was performed.   Future Appointments  Date Time Provider [place]  10/24/2022 11:00 AM Darrol Jump, NP [facility] None  01/30/2023 10:30 AM Unk Pinto, MD [facility] None  05/06/2023 10:00 AM Darrol Jump, NP [facility] None  09/01/2023  1:45 PM [name], NP [facility] None    ------------------------------------------------------------------------------------------------------------------   HPI BP 112/68   Pulse 87   Temp 97.7 F (36.5 C)   Ht 5' 6.5" (1.689 m)   Wt 181 lb 3.2 oz (82.2 kg)   SpO2 97%   BMI 28.81 kg/m   72 y.o.female presents for urinary incontinence- urge incontinence that has gotten worse over the past 2  months. She does not experience stress incontinence. Denies burning on urination, hematuria, and black pain  Has eliminated caffeine from diet and has tried limiting fluid intake and other dietary modifications which did help slightly.   She has just been started on Repatha and is here today to get her first injection to learn how to use the shot  She has noticed a lot more shortness of breath with minimal exertion. She has been to Dr. Harriet Masson who thought she heard some mild crackles when she evaluated her 08/09/22, normal echocardiogram . Dr. Harriet Masson did refer her to pulmonology but has not yet heard back from them for an appointment. She is short of breath with talking and ambulating today.   BP Readings from Last 3 Encounters:  09/09/22 112/68  09/02/22 (!) 145/81  08/26/22 108/70    She does have decreased kidney function and has been trying to push more fluids but has been experiencing more urinary incontinence Lab Results  Component Value Date   EGFR 58 (L) 07/24/2022   BMI is Body mass index is 28.81 kg/m.,  Wt Readings from Last 3 Encounters:  09/09/22 181 lb 3.2 oz (82.2 kg)  09/02/22 182 lb (82.6 kg)  08/26/22 182 lb 3.2 oz (82.6 kg)   She has been started on Repatha by Dr. Harriet Masson and is to receive first injection today Lab Results  Component Value Date   CHOL 207 (H) 07/24/2022   HDL 60 07/24/2022   LDLCALC 115 (H) 07/24/2022  TRIG 205 (H) 07/24/2022   CHOLHDL 3.5 07/24/2022     Past Medical History:  Diagnosis Date   Adnexal mass 04/08/2020   Anemia    Depression    Extrinsic asthma 11/09/2014   Gait abnormality 12/26/2019   GERD (gastroesophageal reflux disease)    Hay fever    Head trauma    IBS (irritable bowel syndrome)    Memory loss    Migraines    OSA on CPAP    Reflux    RLS (restless legs syndrome)    Sinus congestion      Allergies  Allergen Reactions   Erythromycin Other (See Comments)    TBS   Iodinated Contrast Media Other (See Comments)     Hot flashes, itching scalp Hot flashes, itching scalp   Penicillins Hives   Clarithromycin Other (See Comments)    Itching ankles   Brintellix [Vortioxetine]     Itching   Cholestatin     Current Outpatient Medications on File Prior to Visit  Medication Sig   acetaminophen (TYLENOL) 650 MG CR tablet Take 650 mg by mouth every 8 (eight) hours as needed for pain.   albuterol (VENTOLIN HFA) 108 (90 Base) MCG/ACT inhaler Inhale 2 puffs into the lungs every 6 (six) hours as needed for wheezing or shortness of breath.   ASPIRIN 81 PO Aspirin   cholecalciferol (VITAMIN D3) 25 MCG (1000 UNIT) tablet Take 4,000 Units by mouth daily.   Cyanocobalamin 2500 MCG SUBL Place 1 tablet under the tongue daily.   Evolocumab (REPATHA) 140 MG/ML SOSY Inject 1 mL into the skin every 14 (fourteen) days.   FLUoxetine (PROZAC) 10 MG capsule Take 2 capsules ( 20 mg) Daily for Mood                           /                                                      Take                               by                          mouth   fluticasone (FLONASE) 50 MCG/ACT nasal spray 2 prays each nostril once a day   lamoTRIgine (LAMICTAL) 150 MG tablet Take  1 tablet  Daily  for  Mood                          /                                                      Take                               by  mouth   methylphenidate 18 MG PO CR tablet Take 18 mg by mouth daily.   ondansetron (ZOFRAN-ODT) 8 MG disintegrating tablet DISSOLVE 1 TABLET ON THE  TONGUE EVERY 8 HOURS AS  NEEDED FOR NAUSEA AND  VOMITING   Oxymetazoline HCl (NASAL SPRAY NA) Place into the nose.   pantoprazole (PROTONIX) 40 MG tablet Take  1 tablet 2 x / day  to Prevent Heartburn &  Indigestion   sodium chloride (OCEAN) 0.65 % nasal spray Place into the nose.   SUCRALFATE PO Take 1 g by mouth as needed.   SUMAtriptan (IMITREX) 25 MG tablet Take 1-2 tab with onset of migraine. May repeat in 2 hours if headache persists. Max 4 tabs in  24 hours.   triamcinolone cream (KENALOG) 0.1 %    azithromycin (ZITHROMAX) 250 MG tablet Take 2 tablets (500 mg) on  Day 1,  followed by 1 tablet (250 mg) once daily on Days 2 through 5. (Patient not taking: Reported on 09/09/2022)   benzonatate (TESSALON PERLES) 100 MG capsule Take 1 capsule (100 mg total) by mouth every 6 (six) hours as needed for cough. (Patient not taking: Reported on 09/09/2022)   clindamycin (CLEOCIN) 300 MG capsule  (Patient not taking: Reported on 09/09/2022)   ezetimibe (ZETIA) 10 MG tablet Take 10 mg by mouth daily. (Patient not taking: Reported on 09/09/2022)   No current facility-administered medications on file prior to visit.    ROS: all negative except above.   Physical Exam:  BP 112/68   Pulse 87   Temp 97.7 F (36.5 C)   Ht 5' 6.5" (1.689 m)   Wt 181 lb 3.2 oz (82.2 kg)   SpO2 97%   BMI 28.81 kg/m   General Appearance: Well nourished, in no apparent distress. Eyes: PERRLA, EOMs, conjunctiva no swelling or erythema Sinuses: No Frontal/maxillary tenderness ENT/Mouth: Ext aud canals clear, TMs without erythema, bulging. No erythema, swelling, or exudate on post pharynx.  Tonsils not swollen or erythematous. Hearing normal.  Neck: Supple, thyroid normal.  Respiratory: Respiratory effort slightly labored, BS equal bilaterally without rales, rhonchi, wheezing or stridor.  Cardio: RRR with no MRGs. Brisk peripheral pulses without edema.  Abdomen: Soft, + BS.  Non tender, no guarding, rebound, hernias, masses. Lymphatics: Non tender without lymphadenopathy.  Musculoskeletal: Full ROM, 5/5 strength, normal gait.  Skin: Warm, dry without rashes, lesions, ecchymosis.  Neuro: Cranial nerves intact. Normal muscle tone, no cerebellar symptoms. Sensation intact.  Psych: Awake and oriented X 3, normal affect, Insight and Judgment appropriate.     Alycia Rossetti, NP 11:46 AM [facility]

## 2022-09-09 ENCOUNTER — Ambulatory Visit (INDEPENDENT_AMBULATORY_CARE_PROVIDER_SITE_OTHER): Payer: Medicare Other | Admitting: Nurse Practitioner

## 2022-09-09 ENCOUNTER — Encounter: Payer: Self-pay | Admitting: Nurse Practitioner

## 2022-09-09 VITALS — BP 112/68 | HR 87 | Temp 97.7°F | Ht 66.5 in | Wt 181.2 lb

## 2022-09-09 DIAGNOSIS — R0602 Shortness of breath: Secondary | ICD-10-CM | POA: Diagnosis not present

## 2022-09-09 DIAGNOSIS — R0609 Other forms of dyspnea: Secondary | ICD-10-CM

## 2022-09-09 DIAGNOSIS — R000 Tachycardia, unspecified: Secondary | ICD-10-CM

## 2022-09-09 DIAGNOSIS — E782 Mixed hyperlipidemia: Secondary | ICD-10-CM | POA: Diagnosis not present

## 2022-09-09 DIAGNOSIS — N3941 Urge incontinence: Secondary | ICD-10-CM

## 2022-09-09 DIAGNOSIS — N1831 Chronic kidney disease, stage 3a: Secondary | ICD-10-CM

## 2022-09-09 LAB — D-DIMER, QUANTITATIVE: D-Dimer, Quant: 1.66 mcg/mL FEU — ABNORMAL HIGH (ref <0.50)

## 2022-09-09 MED ORDER — MIRABEGRON ER 25 MG PO TB24: 25.0000 mg | ORAL_TABLET | Freq: Every day | ORAL | 0 refills | Status: DC

## 2022-09-09 NOTE — Patient Instructions (Addendum)
Start trelegy 1 puff daily Albuterol inhaler 2 puffs every 6 hours as needed for shortness of breath Have placed another referral to pulmonology for pulmonary function studies If feel like you cannot get a deep breath and have palpitations please go to the ER  Shortness of Breath, Adult Shortness of breath is when a person has trouble breathing or when a person feels like she or he is having trouble breathing in enough air. Shortness of breath could be a sign of a medical problem. Follow these instructions at home:  Pollutants Do not use any products that contain nicotine or tobacco. These products include cigarettes, chewing tobacco, and vaping devices, such as e-cigarettes. This also includes cigars and pipes. If you need help quitting, ask your health care provider. Avoid things that can irritate your airways, including: Smoke. This includes campfire smoke, forest fire smoke, and secondhand smoke from tobacco products. Do not smoke or allow others to smoke in your home. Mold. Dust. Air pollution. Chemical fumes. Things that can give you an allergic reaction (allergens) if you have allergies. Common allergens include pollen from grasses or trees and animal dander. Keep your living space clean and free of mold and dust. General instructions Pay attention to any changes in your symptoms. Take over-the-counter and prescription medicines only as told by your health care provider. This includes oxygen therapy and inhaled medicines. Rest as needed. Return to your normal activities as told by your health care provider. Ask your health care provider what activities are safe for you. Keep all follow-up visits. This is important. Contact a health care provider if: Your condition does not improve as soon as expected. You have a hard time doing your normal activities, even after you rest. You have new symptoms. You cannot walk up stairs or exercise the way that you normally do. Get help right  away if: Your shortness of breath gets worse. You have shortness of breath when you are resting. You feel light-headed or you faint. You have a cough that is not controlled with medicines. You cough up blood. You have pain with breathing. You have pain in your chest, arms, shoulders, or abdomen. You have a fever. These symptoms may be an emergency. Get help right away. Call 911. Do not wait to see if the symptoms will go away. Do not drive yourself to the hospital. Summary Shortness of breath is when a person has trouble breathing enough air. It can be a sign of a medical problem. Avoid things that irritate your lungs, such as smoking, pollution, mold, and dust. Pay attention to changes in your symptoms and contact your health care provider if you have a hard time completing daily activities because of shortness of breath. This information is not intended to replace advice given to you by your health care provider. Make sure you discuss any questions you have with your health care provider.  Mirabegron Extended-Release Tablets What is this medication? MIRABEGRON (MIR a BEG ron) treats symptoms of an overactive bladder, such as loss of bladder control or frequent need to urinate. It works by relaxing muscles in the bladder. It belongs to a group of medications called antispasmodics. This medicine may be used for other purposes; ask your health care provider or pharmacist if you have questions. COMMON BRAND NAME(S): Myrbetriq What should I tell my care team before I take this medication? They need to know if you have any of these conditions: High blood pressure Kidney disease Liver disease Prostate disease Trouble passing urine  An unusual or allergic reaction to mirabegron, other medications, foods, dyes, or preservatives Pregnant or trying to get pregnant Breast-feeding How should I use this medication? Take this medication by mouth with water. Take it as directed on the prescription  label at the same time every day. Do not cut, crush, or chew this medication. Swallow the tablets whole. Adults can take it with or without food. Children should take it with food. Keep taking it unless your care team tells you to stop. Talk to your care team about the use of this medication in children. While it may be prescribed for children as young as 3 years for selected conditions, precautions do apply. Overdosage: If you think you have taken too much of this medicine contact a poison control center or emergency room at once. NOTE: This medicine is only for you. Do not share this medicine with others. What if I miss a dose? If you miss a dose, take it as soon as you can unless it is more than 12 hours late. If it is more than 12 hours late, skip the missed dose. Take the next dose at the normal time. What may interact with this medication? Codeine Desipramine Digoxin Flecainide MAOIs, such as Carbex, Eldepryl, Marplan, Nardil, and Parnate Methadone Metoprolol Pimozide Propafenone Thioridazine Warfarin This list may not describe all possible interactions. Give your health care provider a list of all the medicines, herbs, non-prescription drugs, or dietary supplements you use. Also tell them if you smoke, drink alcohol, or use illegal drugs. Some items may interact with your medicine. What should I watch for while using this medication? Visit your care team for regular checks on your progress. Tell your care team if your symptoms do not start to get better or if they get worse. Check your blood pressure as directed. Know what your blood pressure should be and when to contact your care team. What side effects may I notice from receiving this medication? Side effects that you should report to your care team as soon as possible: Allergic reactions or angioedema--skin rash, itching or hives, swelling of the face, eyes, lips, tongue, arms, or legs, trouble swallowing or breathing Increase in  blood pressure Trouble passing urine Side effects that usually do not require medical attention (report to your care team if they continue or are bothersome): Headache Runny or stuffy nose Sore throat Stomach pain This list may not describe all possible side effects. Call your doctor for medical advice about side effects. You may report side effects to FDA at 1-800-FDA-1088. Where should I keep my medication? Keep out of the reach of children and pets. Store at room temperature between 20 and 25 degrees C (68 and 77 degrees F). Get rid of any unused medication after the expiration date. To get rid of medications that are no longer needed or have expired: Take the medication to a medication take-back program. Check with your pharmacy or law enforcement to find a location. If you cannot return the medication, check the label or package insert to see if the medication should be thrown out in the garbage or flushed down the toilet. If you are not sure, ask your care team. If it is safe to put it in the trash, empty the medication out of the container. Mix the medication with cat litter, dirt, coffee grounds, or other unwanted substance. Seal the mixture in a bag or container. Put it in the trash. NOTE: This sheet is a summary. It may not cover all  possible information. If you have questions about this medicine, talk to your doctor, pharmacist, or health care provider.  2023 Elsevier/Gold Standard (2021-07-02 00:00:00)  Document Revised: 03/23/2021 Document Reviewed: 03/23/2021 Elsevier Patient Education  [place].

## 2022-09-10 LAB — URINALYSIS, ROUTINE W REFLEX MICROSCOPIC
Bilirubin Urine: NEGATIVE
Glucose, UA: NEGATIVE
Hgb urine dipstick: NEGATIVE
Ketones, ur: NEGATIVE
Leukocytes,Ua: NEGATIVE
Nitrite: NEGATIVE
Protein, ur: NEGATIVE
Specific Gravity, Urine: 1.021 (ref 1.001–1.035)
pH: 5.5 (ref 5.0–8.0)

## 2022-09-10 LAB — URINE CULTURE
MICRO NUMBER:: 14462186
SPECIMEN QUALITY:: ADEQUATE

## 2022-09-10 NOTE — Addendum Note (Signed)
Addended by: Alycia Rossetti on: 09/10/2022 08:56 AM   Modules accepted: Orders

## 2022-09-19 ENCOUNTER — Other Ambulatory Visit: Payer: Self-pay

## 2022-09-19 ENCOUNTER — Ambulatory Visit: Payer: Medicare Other | Admitting: Nurse Practitioner

## 2022-09-19 ENCOUNTER — Encounter: Payer: Self-pay | Admitting: Nurse Practitioner

## 2022-09-19 VITALS — HR 74 | Temp 98.0°F

## 2022-09-19 DIAGNOSIS — G4483 Primary cough headache: Secondary | ICD-10-CM

## 2022-09-19 DIAGNOSIS — U071 COVID-19: Secondary | ICD-10-CM

## 2022-09-19 LAB — POC INFLUENZA A&B (BINAX/QUICKVUE)
Influenza A, POC: NEGATIVE
Influenza B, POC: NEGATIVE

## 2022-09-19 LAB — POC COVID19 BINAXNOW: SARS Coronavirus 2 Ag: POSITIVE — AB

## 2022-09-19 MED ORDER — MOLNUPIRAVIR EUA 200MG CAPSULE: 4.0000 | ORAL_CAPSULE | Freq: Two times a day (BID) | ORAL | 0 refills | Status: AC

## 2022-09-19 NOTE — Patient Instructions (Signed)
Due to co morbid conditions and risk factors, discussed antivirals  Immue support reviewed  Take tylenol PRN temp 101+ Push hydration Regular ambulation or calf exercises exercises for clot prevention and 81 mg ASA unless contraindicated Sx supportive therapy suggested Follow up via mychart or telephone if needed Advised patient obtain O2 monitor; present to ED if persistently <90% or with severe dyspnea, CP, fever uncontrolled by tylenol, confusion, sudden decline       Should remain in isolation 5 days from testing positive and then wear a mask when around other people for the following 5 days

## 2022-09-19 NOTE — Progress Notes (Signed)
THIS ENCOUNTER IS A VIRTUAL VISIT DUE TO COVID-19 - PATIENT WAS NOT SEEN IN THE OFFICE.  PATIENT HAS CONSENTED TO VIRTUAL VISIT / TELEMEDICINE VISIT   Virtual Visit via telephone Note  I connected with  Kimberly Ray on 09/19/2022 by telephone.  I verified that I am speaking with the correct person using two identifiers.    I discussed the limitations of evaluation and management by telemedicine and the availability of in person appointments. The patient expressed understanding and agreed to proceed.  History of Present Illness:  Pulse 74   Temp 98 F (36.7 C)   SpO2 97%  72 y.o. patient contacted office reporting URI sx . she tested positive by test in parking lot. OV was conducted by telephone to minimize exposure. This patient was vaccinated for covid 19, last 12/08/20    Sx began today ago with productive cough with yellow mucus and head congestion, body aches, fatigue,sore throat  Treatments tried so far: has had steroid and antibiotic in the past 2 weeks  Exposures: unknown   Medications   Current Outpatient Medications (Cardiovascular):    Evolocumab (REPATHA) 140 MG/ML SOSY, Inject 1 mL into the skin every 14 (fourteen) days.  Current Outpatient Medications (Respiratory):    albuterol (VENTOLIN HFA) 108 (90 Base) MCG/ACT inhaler, Inhale 2 puffs into the lungs every 6 (six) hours as needed for wheezing or shortness of breath.   fluticasone (FLONASE) 50 MCG/ACT nasal spray, 2 prays each nostril once a day   Oxymetazoline HCl (NASAL SPRAY NA), Place into the nose.   sodium chloride (OCEAN) 0.65 % nasal spray, Place into the nose.  Current Outpatient Medications (Analgesics):    acetaminophen (TYLENOL) 650 MG CR tablet, Take 650 mg by mouth every 8 (eight) hours as needed for pain.   ASPIRIN 81 PO, Aspirin   SUMAtriptan (IMITREX) 25 MG tablet, Take 1-2 tab with onset of migraine. May repeat in 2 hours if headache persists. Max 4 tabs in 24 hours.  Current  Outpatient Medications (Hematological):    Cyanocobalamin 2500 MCG SUBL, Place 1 tablet under the tongue daily.  Current Outpatient Medications (Other):    cholecalciferol (VITAMIN D3) 25 MCG (1000 UNIT) tablet, Take 4,000 Units by mouth daily.   FLUoxetine (PROZAC) 10 MG capsule, Take 2 capsules ( 20 mg) Daily for Mood                           /                                                      Take                               by                          mouth   lamoTRIgine (LAMICTAL) 150 MG tablet, Take  1 tablet  Daily  for  Mood                          /  Take                               by                          mouth   methylphenidate 18 MG PO CR tablet, Take 18 mg by mouth daily.   mirabegron ER (MYRBETRIQ) 25 MG TB24 tablet, Take 1 tablet (25 mg total) by mouth daily.   ondansetron (ZOFRAN-ODT) 8 MG disintegrating tablet, DISSOLVE 1 TABLET ON THE  TONGUE EVERY 8 HOURS AS  NEEDED FOR NAUSEA AND  VOMITING   pantoprazole (PROTONIX) 40 MG tablet, Take  1 tablet 2 x / day  to Prevent Heartburn &  Indigestion   SUCRALFATE PO, Take 1 g by mouth as needed.   triamcinolone cream (KENALOG) 0.1 %,   Allergies:  Allergies  Allergen Reactions   Erythromycin Other (See Comments)    TBS   Iodinated Contrast Media Other (See Comments)    Hot flashes, itching scalp Hot flashes, itching scalp   Penicillins Hives   Clarithromycin Other (See Comments)    Itching ankles   Brintellix [Vortioxetine]     Itching   Cholestatin     Problem list She has Mixed hyperlipidemia; ADD (attention deficit disorder); OSA on CPAP; Major depression, recurrent, chronic ([place]); Vitamin D deficiency; GERD ; Obesity (BMI 30.0-34.9); Circadian rhythm sleep disorder; Periodic limb movement disorder (PLMD); Fatty liver; Osteopenia; Memory changes; Gait abnormality; RLS (restless legs syndrome); B12 deficiency; Seasonal allergies; Nonrheumatic mitral valve  regurgitation; Prediabetes; Hypertriglyceridemia; Stage 3a chronic kidney disease ([place]); SOB (shortness of breath); Chest pain; Medication management; and DOE (dyspnea on exertion) on their problem list.   Social History:   reports that she has never smoked. She has never used smokeless tobacco. She reports that she does not currently use alcohol. She reports that she does not use drugs.  Observations/Objective:  General : Well sounding patient in no apparent distress HEENT: no hoarseness, no cough for duration of visit Lungs: speaks in complete sentences, no audible wheezing, no apparent distress Neurological: alert, oriented x 3 Psychiatric: pleasant, judgement appropriate   Assessment and Plan:  Covid 19 Covid 19 positive per rapid screening test in parking lot at office Risk factors include: has Mixed hyperlipidemia; ADD (attention deficit disorder); OSA on CPAP; Major depression, recurrent, chronic ([place]); Vitamin D deficiency; GERD ; Obesity (BMI 30.0-34.9); Circadian rhythm sleep disorder; Periodic limb movement disorder (PLMD); Fatty liver; Osteopenia; Memory changes; Gait abnormality; RLS (restless legs syndrome); B12 deficiency; Seasonal allergies; Nonrheumatic mitral valve regurgitation; Prediabetes; Hypertriglyceridemia; Stage 3a chronic kidney disease ([place]); SOB (shortness of breath); Chest pain; Medication management; and DOE (dyspnea on exertion) on their problem list.  Symptoms are: mild Due to co morbid conditions and risk factors, discussed antivirals  Immue support reviewed  Take tylenol PRN temp 101+ Push hydration Regular ambulation or calf exercises exercises for clot prevention and 81 mg ASA unless contraindicated Sx supportive therapy suggested Follow up via mychart or telephone if needed Advised patient obtain O2 monitor; present to ED if persistently <90% or with severe dyspnea, CP, fever uncontrolled by tylenol, confusion, sudden decline       Should remain in  isolation 5 days from testing positive and then wear a mask when around other people for the following 5 days  Kimberly Ray was seen today for cough.  Diagnoses and all orders for this visit:  Cough headache -     POC Influenza A&B (Binax test)- negative -     POC COVID-19- positive  COVID Wants to hold off on steroids as just had a course If no improvement in the next 3 days notify the office -     molnupiravir EUA (LAGEVRIO) 200 mg CAPS capsule; Take 4 capsules (800 mg total) by mouth 2 (two) times daily for 5 days.     Follow Up Instructions:  I discussed the assessment and treatment plan with the patient. The patient was provided an opportunity to ask questions and all were answered. The patient agreed with the plan and demonstrated an understanding of the instructions.   The patient was advised to call back or seek an in-person evaluation if the symptoms worsen or if the condition fails to improve as anticipated.  I provided 20 minutes of non-face-to-face time during this encounter.   Alycia Rossetti, NP

## 2022-09-22 NOTE — Progress Notes (Deleted)
Assessment and Plan:  There are no diagnoses linked to this encounter.    Further disposition pending results of labs. Discussed med's effects and SE's.   Over 30 minutes of exam, counseling, chart review, and critical decision making was performed.   Future Appointments  Date Time Provider [employer]  09/23/2022 11:30 AM Alycia Rossetti, NP [facility] None  09/26/2022  2:30 PM Freddi Starr, MD [facility] None  10/24/2022 11:00 AM Darrol Jump, NP [facility] None  01/30/2023 10:30 AM Unk Pinto, MD [facility] None  05/06/2023 10:00 AM Darrol Jump, NP [facility] None  09/01/2023  1:45 PM [name], NP [facility] None    ------------------------------------------------------------------------------------------------------------------   HPI There were no vitals taken for this visit. 72 y.o.female presents for  Past Medical History:  Diagnosis Date   Adnexal mass 04/08/2020   Anemia    Depression    Extrinsic asthma 11/09/2014   Gait abnormality 12/26/2019   GERD (gastroesophageal reflux disease)    Hay fever    Head trauma    IBS (irritable bowel syndrome)    Memory loss    Migraines    OSA on CPAP    Reflux    RLS (restless legs syndrome)    Sinus congestion      Allergies  Allergen Reactions   Erythromycin Other (See Comments)    TBS   Iodinated Contrast Media Other (See Comments)    Hot flashes, itching scalp Hot flashes, itching scalp   Penicillins Hives   Clarithromycin Other (See Comments)    Itching ankles   Brintellix [Vortioxetine]     Itching   Cholestatin     Current Outpatient Medications on File Prior to Visit  Medication Sig   acetaminophen (TYLENOL) 650 MG CR tablet Take 650 mg by mouth every 8 (eight) hours as needed for pain.   albuterol (VENTOLIN HFA) 108 (90 Base) MCG/ACT inhaler Inhale 2 puffs into the lungs every 6 (six) hours as needed for wheezing or shortness of breath.   ASPIRIN 81 PO Aspirin   cholecalciferol  (VITAMIN D3) 25 MCG (1000 UNIT) tablet Take 4,000 Units by mouth daily.   Cyanocobalamin 2500 MCG SUBL Place 1 tablet under the tongue daily.   Evolocumab (REPATHA) 140 MG/ML SOSY Inject 1 mL into the skin every 14 (fourteen) days.   FLUoxetine (PROZAC) 10 MG capsule Take 2 capsules ( 20 mg) Daily for Mood                           /                                                      Take                               by                          mouth   fluticasone (FLONASE) 50 MCG/ACT nasal spray 2 prays each nostril once a day   lamoTRIgine (LAMICTAL) 150 MG tablet Take  1 tablet  Daily  for  Mood                          /  Take                               by                          mouth   methylphenidate 18 MG PO CR tablet Take 18 mg by mouth daily.   mirabegron ER (MYRBETRIQ) 25 MG TB24 tablet Take 1 tablet (25 mg total) by mouth daily.   molnupiravir EUA (LAGEVRIO) 200 mg CAPS capsule Take 4 capsules (800 mg total) by mouth 2 (two) times daily for 5 days.   ondansetron (ZOFRAN-ODT) 8 MG disintegrating tablet DISSOLVE 1 TABLET ON THE  TONGUE EVERY 8 HOURS AS  NEEDED FOR NAUSEA AND  VOMITING   Oxymetazoline HCl (NASAL SPRAY NA) Place into the nose.   pantoprazole (PROTONIX) 40 MG tablet Take  1 tablet 2 x / day  to Prevent Heartburn &  Indigestion   sodium chloride (OCEAN) 0.65 % nasal spray Place into the nose.   SUCRALFATE PO Take 1 g by mouth as needed.   SUMAtriptan (IMITREX) 25 MG tablet Take 1-2 tab with onset of migraine. May repeat in 2 hours if headache persists. Max 4 tabs in 24 hours.   triamcinolone cream (KENALOG) 0.1 %    No current facility-administered medications on file prior to visit.    ROS: all negative except above.   Physical Exam:  There were no vitals taken for this visit.  General Appearance: Well nourished, in no apparent distress. Eyes: PERRLA, EOMs, conjunctiva no swelling or erythema Sinuses: No  Frontal/maxillary tenderness ENT/Mouth: Ext aud canals clear, TMs without erythema, bulging. No erythema, swelling, or exudate on post pharynx.  Tonsils not swollen or erythematous. Hearing normal.  Neck: Supple, thyroid normal.  Respiratory: Respiratory effort normal, BS equal bilaterally without rales, rhonchi, wheezing or stridor.  Cardio: RRR with no MRGs. Brisk peripheral pulses without edema.  Abdomen: Soft, + BS.  Non tender, no guarding, rebound, hernias, masses. Lymphatics: Non tender without lymphadenopathy.  Musculoskeletal: Full ROM, 5/5 strength, normal gait.  Skin: Warm, dry without rashes, lesions, ecchymosis.  Neuro: Cranial nerves intact. Normal muscle tone, no cerebellar symptoms. Sensation intact.  Psych: Awake and oriented X 3, normal affect, Insight and Judgment appropriate.     Alycia Rossetti, NP 9:14 AM [facility]

## 2022-09-23 ENCOUNTER — Ambulatory Visit: Payer: Medicare Other | Admitting: Nurse Practitioner

## 2022-09-26 ENCOUNTER — Encounter: Payer: Self-pay | Admitting: Pulmonary Disease

## 2022-09-26 ENCOUNTER — Ambulatory Visit: Payer: Medicare Other | Admitting: Pulmonary Disease

## 2022-09-26 VITALS — BP 124/76 | HR 81 | Ht 66.5 in | Wt 180.0 lb

## 2022-09-26 DIAGNOSIS — J4520 Mild intermittent asthma, uncomplicated: Secondary | ICD-10-CM

## 2022-09-26 DIAGNOSIS — R0602 Shortness of breath: Secondary | ICD-10-CM

## 2022-09-26 MED ORDER — FLUTICASONE-SALMETEROL 250-50 MCG/ACT IN AEPB: 1.0000 | INHALATION_SPRAY | Freq: Two times a day (BID) | RESPIRATORY_TRACT | 2 refills | Status: DC

## 2022-09-26 NOTE — Patient Instructions (Addendum)
Start advair diskus 250-30mg 1 puff twice daily - rinse mouth out after each use  Continue to use albuterol as needed  We will check pulmonary function tests at follow up in 2 months.

## 2022-09-26 NOTE — Progress Notes (Signed)
Synopsis: Referred in February 2024 for shortness of breath by Lorenda Peck, NP  Subjective:   PATIENT ID: Kimberly Ray GENDER: female DOB: November 24, 1950, MRN: [id number]   HPI  Chief Complaint  Patient presents with   Consult    Referred by PCP for DOE since February 2022. Recently tested positive for COVID (February 2024).   Kimberly Ray is a 72 year old woman, never smoker with GERD and OSA on CPAP who is referred to pulmonary clinic for shortness of breath.   She reports shortness of breath over the past 2 years. She initially noted it when her cousin thought she was more short of breath than normal. She has others state she sounds like she is wheezing but she does not hear herself. The dyspnea may be worse during colder weather. She has dry cough. The cough may wake her up occasionally at night. She does have sinus congestion and post-nasal drainage.   She was recently treated for sinus infection with antibiotics and prednisone. She reports it did not help her dyspnea much.   She recently tried trelegy inhaler and albuterol for her shorntess of breath which she reports did give her some improvement.   She denies history of  recurrent pneumonia or bronchitis. She takes zyrtec and flonase as needed.  She is a retired Catering manager. Never smoker and no second hand smoke exposure. She reports possible asbestos exposure from a prior apartment where she lived for 18 years. 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  Ddimer was elevated 1.66 on 1/23.  Based on age adjusted calculator, VTE is unlikely  Past Medical History:  Diagnosis Date   Adnexal mass 04/08/2020   Anemia    Depression    Extrinsic asthma 11/09/2014   Gait abnormality 12/26/2019   GERD (gastroesophageal reflux disease)    Hay fever    Head trauma    IBS (irritable bowel syndrome)    Memory loss    Migraines    OSA on CPAP    Reflux    RLS (restless legs syndrome)    Sinus  congestion      Family History  Problem Relation Age of Onset   Diabetes Mother    Heart failure Father    Dementia Father    Mood Disorder Father    Anxiety disorder Father    Diabetes Sister    Anxiety disorder Sister    Depression Sister    Breast cancer Maternal Grandmother 109   Breast cancer Maternal Aunt 45   Breast cancer Cousin    Osteoporosis Brother    Bipolar disorder Brother    Anxiety disorder Brother    Heart failure Maternal Grandfather 68   Colon cancer Paternal Grandmother 22   Mental illness Paternal Grandmother    Stomach cancer Maternal Aunt      Social History   Socioeconomic History   Marital status: Divorced    Spouse name: Not on file   Number of children: 2   Years of education: 13   Highest education level: Not on file  Occupational History    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 Patient is flight attendant. Patient has college education one year      Right handed.   Caffeine- Rare.    Social Determinants of Health   Financial Resource Strain: Not on file  Food Insecurity: Not on file  Transportation Needs: Not on file  Physical Activity: Inactive (04/06/2018)   Exercise Vital Sign    Days of Exercise per Week: 0 days    Minutes of Exercise per Session: 0 min  Stress: No Stress Concern Present (04/06/2018)   [place]    Feeling of Stress : Only a little  Social Connections: Not on file  Intimate Partner Violence: Not on file     Allergies  Allergen  Reactions   Erythromycin Other (See Comments)    TBS   Iodinated Contrast Media Other (See Comments)    Hot flashes, itching scalp Hot flashes, itching scalp   Penicillins Hives   Clarithromycin Other (See Comments)    Itching ankles   Brintellix [Vortioxetine]     Itching   Cholestatin      Outpatient Medications Prior to Visit  Medication Sig Dispense Refill   acetaminophen (TYLENOL) 650 MG CR tablet Take 650 mg by mouth every 8 (eight) hours as needed for pain.     albuterol (VENTOLIN HFA) 108 (90 Base) MCG/ACT inhaler Inhale 2 puffs into the lungs every 6 (six) hours as needed for wheezing or shortness of breath. 8 g 2   ASPIRIN 81 PO Aspirin     cholecalciferol (VITAMIN D3) 25 MCG (1000 UNIT) tablet Take 4,000 Units by mouth daily.     Cyanocobalamin 2500 MCG SUBL Place 1 tablet under the tongue daily.     Evolocumab (REPATHA) 140 MG/ML SOSY Inject 1 mL into the skin every 14 (fourteen) days. 2.1 mL 6   FLUoxetine (PROZAC) 10 MG capsule Take 2 capsules ( 20 mg) Daily for Mood                           /                                                      Take                               by                          mouth 180 capsule 3   fluticasone (FLONASE) 50 MCG/ACT nasal spray 2 prays each nostril once a day 16 g 2   lamoTRIgine (LAMICTAL) 150 MG tablet Take  1 tablet  Daily  for  Mood                          /  Take                               by                          mouth 90 tablet 3   methylphenidate 18 MG PO CR tablet Take 18 mg by mouth daily.     mirabegron ER (MYRBETRIQ) 25 MG TB24 tablet Take 1 tablet (25 mg total) by mouth daily. 30 tablet 0   ondansetron (ZOFRAN-ODT) 8 MG disintegrating tablet DISSOLVE 1 TABLET ON THE  TONGUE EVERY 8 HOURS AS  NEEDED FOR NAUSEA AND  VOMITING 60 tablet 0   Oxymetazoline HCl (NASAL SPRAY NA) Place into the nose.     pantoprazole (PROTONIX) 40 MG tablet Take  1 tablet 2 x / day   to Prevent Heartburn &  Indigestion 180 tablet 3   sodium chloride (OCEAN) 0.65 % nasal spray Place into the nose.     SUCRALFATE PO Take 1 g by mouth as needed.     SUMAtriptan (IMITREX) 25 MG tablet Take 1-2 tab with onset of migraine. May repeat in 2 hours if headache persists. Max 4 tabs in 24 hours. 30 tablet 0   triamcinolone cream (KENALOG) 0.1 %      No facility-administered medications prior to visit.    Review of Systems  Constitutional:  Negative for chills, fever, malaise/fatigue and weight loss.  HENT:  Negative for congestion, sinus pain and sore throat.   Eyes: Negative.   Respiratory:  Positive for cough, shortness of breath and wheezing. Negative for hemoptysis and sputum production.   Cardiovascular:  Negative for chest pain, palpitations, orthopnea, claudication and leg swelling.  Gastrointestinal:  Negative for abdominal pain, heartburn, nausea and vomiting.  Genitourinary: Negative.   Musculoskeletal:  Negative for joint pain and myalgias.  Skin:  Negative for rash.  Neurological:  Negative for weakness.  Endo/Heme/Allergies: Negative.   Psychiatric/Behavioral: Negative.     Objective:   Vitals:   09/26/22 1421  BP: 124/76  Pulse: 81  SpO2: 97%  Weight: 180 lb (81.6 kg)  Height: 5' 6.5" (1.689 m)   Physical Exam Constitutional:      General: She is not in acute distress.    Appearance: She is not ill-appearing.  HENT:     Head: Normocephalic and atraumatic.  Eyes:     General: No scleral icterus.    Conjunctiva/sclera: Conjunctivae normal.     Pupils: Pupils are equal, round, and reactive to light.  Cardiovascular:     Rate and Rhythm: Normal rate and regular rhythm.     Pulses: Normal pulses.     Heart sounds: Normal heart sounds. No murmur heard. Pulmonary:     Effort: Pulmonary effort is normal.     Breath sounds: Normal breath sounds. No wheezing, rhonchi or rales.  Abdominal:     General: Bowel sounds are normal.     Palpations: Abdomen is  soft.  Musculoskeletal:     Right lower leg: No edema.     Left lower leg: No edema.  Lymphadenopathy:     Cervical: No cervical adenopathy.  Skin:    General: Skin is warm and dry.  Neurological:     General: No focal deficit present.     Mental Status: She is alert.  Psychiatric:        Mood and Affect: Mood normal.  Behavior: Behavior normal.        Thought Content: Thought content normal.        Judgment: Judgment normal.     CBC    Component Value Date/Time   WBC 7.3 07/24/2022 0000   RBC 4.45 07/24/2022 0000   HGB 12.0 07/24/2022 0000   HGB 11.6 07/17/2022 1211   HCT 37.7 07/24/2022 0000   HCT 37.6 07/17/2022 1211   PLT 357 07/24/2022 0000   PLT 377 07/17/2022 1211   MCV 84.7 07/24/2022 0000   MCV 86 07/17/2022 1211   MCH 27.0 07/24/2022 0000   MCHC 31.8 (L) 07/24/2022 0000   RDW 13.2 07/24/2022 0000   RDW 13.4 07/17/2022 1211   LYMPHSABS 1,847 07/24/2022 0000   MONOABS 576 01/20/2017 1006   EOSABS 88 07/24/2022 0000   BASOSABS 58 07/24/2022 0000      Latest Ref Rng & Units 07/24/2022   12:00 AM 07/17/2022   12:11 PM 04/18/2022   10:41 AM  BMP  Glucose 65 - 99 mg/dL 148  95  70   BUN 7 - 25 mg/dL 19  25  19   $ Creatinine 0.60 - 1.00 mg/dL 1.03  0.94  0.94   BUN/Creat Ratio 6 - 22 (calc) 18  27  SEE NOTE:   Sodium 135 - 146 mmol/L 137  141  140   Potassium 3.5 - 5.3 mmol/L 4.5  4.6  4.6   Chloride 98 - 110 mmol/L 99  103  108   CO2 20 - 32 mmol/L 24  23  24   $ Calcium 8.6 - 10.4 mg/dL 9.6  9.7  9.0    Chest imaging: CXR 07/17/22 The lungs are clear and negative for focal airspace consolidation, pulmonary edema or suspicious pulmonary nodule. No pleural effusion or pneumothorax. Cardiac and mediastinal contours are within normal limits. No acute fracture or lytic or blastic osseous lesions. The visualized upper abdominal bowel gas pattern is unremarkable.  PFT:     No data to display          Labs:  Path:  Echo 07/2022: LV EF 55-60%.  Grade I diastolic dysfunction. RV size and function is normal  Heart Catheterization:       Assessment & Plan:   Mild intermittent reactive airway disease without complication - Plan: Pulmonary Function Test  Shortness of breath  Discussion: Kimberly Ray is a 72 year old woman, never smoker with GERD and OSA on CPAP who is referred to pulmonary clinic for shortness of breath.   The etiology of her dyspnea is not clear. Differential includes reactive airways disease vs obstructive lung disease. Less likely ILD or restrictive lung disease based on recent chest imaging.   She is to start trial of advair diskus 1 puff twice daily and continue as needed albuterol.  Follow up in 2 months with PFTs.   Freda Jackson, MD [facility] Office: [phone]    Current Outpatient Medications:    acetaminophen (TYLENOL) 650 MG CR tablet, Take 650 mg by mouth every 8 (eight) hours as needed for pain., Disp: , Rfl:    albuterol (VENTOLIN HFA) 108 (90 Base) MCG/ACT inhaler, Inhale 2 puffs into the lungs every 6 (six) hours as needed for wheezing or shortness of breath., Disp: 8 g, Rfl: 2   ASPIRIN 81 PO, Aspirin, Disp: , Rfl:    cholecalciferol (VITAMIN D3) 25 MCG (1000 UNIT) tablet, Take 4,000 Units by mouth daily., Disp: , Rfl:    Cyanocobalamin  2500 MCG SUBL, Place 1 tablet under the tongue daily., Disp: , Rfl:    Evolocumab (REPATHA) 140 MG/ML SOSY, Inject 1 mL into the skin every 14 (fourteen) days., Disp: 2.1 mL, Rfl: 6   FLUoxetine (PROZAC) 10 MG capsule, Take 2 capsules ( 20 mg) Daily for Mood                           /                                                      Take                               by                          mouth, Disp: 180 capsule, Rfl: 3   fluticasone (FLONASE) 50 MCG/ACT nasal spray, 2 prays each nostril once a day, Disp: 16 g, Rfl: 2   lamoTRIgine (LAMICTAL) 150 MG tablet, Take  1 tablet  Daily  for  Mood                          /                                                       Take                               by                          mouth, Disp: 90 tablet, Rfl: 3   methylphenidate 18 MG PO CR tablet, Take 18 mg by mouth daily., Disp: , Rfl:    mirabegron ER (MYRBETRIQ) 25 MG TB24 tablet, Take 1 tablet (25 mg total) by mouth daily., Disp: 30 tablet, Rfl: 0   ondansetron (ZOFRAN-ODT) 8 MG disintegrating tablet, DISSOLVE 1 TABLET ON THE  TONGUE EVERY 8 HOURS AS  NEEDED FOR NAUSEA AND  VOMITING, Disp: 60 tablet, Rfl: 0   Oxymetazoline HCl (NASAL SPRAY NA), Place into the nose., Disp: , Rfl:    pantoprazole (PROTONIX) 40 MG tablet, Take  1 tablet 2 x / day  to Prevent Heartburn &  Indigestion, Disp: 180 tablet, Rfl: 3   sodium chloride (OCEAN) 0.65 % nasal spray, Place into the nose., Disp: , Rfl:    SUCRALFATE PO, Take 1 g by mouth as needed., Disp: , Rfl:    SUMAtriptan (IMITREX) 25 MG tablet, Take 1-2 tab with onset of migraine. May repeat in 2 hours if headache persists. Max 4 tabs in 24 hours., Disp: 30 tablet, Rfl: 0

## 2022-09-29 ENCOUNTER — Ambulatory Visit: Payer: Medicare Other | Admitting: Nurse Practitioner

## 2022-10-01 ENCOUNTER — Encounter: Payer: Self-pay | Admitting: Nurse Practitioner

## 2022-10-01 ENCOUNTER — Ambulatory Visit (INDEPENDENT_AMBULATORY_CARE_PROVIDER_SITE_OTHER): Payer: Medicare Other | Admitting: Nurse Practitioner

## 2022-10-01 VITALS — BP 122/50 | HR 98 | Temp 97.5°F | Ht 66.5 in | Wt 181.6 lb

## 2022-10-01 DIAGNOSIS — Z79899 Other long term (current) drug therapy: Secondary | ICD-10-CM

## 2022-10-01 DIAGNOSIS — G43009 Migraine without aura, not intractable, without status migrainosus: Secondary | ICD-10-CM | POA: Diagnosis not present

## 2022-10-01 DIAGNOSIS — U071 COVID-19: Secondary | ICD-10-CM

## 2022-10-01 DIAGNOSIS — K219 Gastro-esophageal reflux disease without esophagitis: Secondary | ICD-10-CM

## 2022-10-01 DIAGNOSIS — E782 Mixed hyperlipidemia: Secondary | ICD-10-CM | POA: Diagnosis not present

## 2022-10-01 DIAGNOSIS — R0602 Shortness of breath: Secondary | ICD-10-CM

## 2022-10-01 MED ORDER — PANTOPRAZOLE SODIUM 40 MG PO TBEC: DELAYED_RELEASE_TABLET | ORAL | 3 refills | Status: DC

## 2022-10-01 MED ORDER — SUMATRIPTAN SUCCINATE 25 MG PO TABS: ORAL_TABLET | ORAL | 0 refills | Status: DC

## 2022-10-01 MED ORDER — MIRABEGRON ER 25 MG PO TB24: 25.0000 mg | ORAL_TABLET | Freq: Every day | ORAL | 0 refills | Status: DC

## 2022-10-01 MED ORDER — REPATHA 140 MG/ML ~~LOC~~ SOSY: 1.0000 mL | PREFILLED_SYRINGE | SUBCUTANEOUS | 6 refills | Status: DC

## 2022-10-01 NOTE — Progress Notes (Signed)
Assessment and Plan:  Kimberly Ray was seen today for a follow up.  Diagnoses and all order for this visit:  COVID/SOB Continue to follow with Pulmonology for PFTs Continue Advair and Albuterol Start Zyrtec 10 mg (samples provided). Suggested restarting Singular with inhalers if antihistamine does not help to resolve SOB  Mixed hyperlipidemia Medication refilled per patient request Discussed lifestyle modifications. Recommended diet heavy in fruits and veggies, omega 3's. Decrease consumption of animal meats, cheeses, and dairy products. Remain active and exercise as tolerated. Continue to monitor.  - Evolocumab (REPATHA) 140 MG/ML SOSY; Inject 1 mL into the skin every 14 (fourteen) days.  Dispense: 2.1 mL; Refill: 6  Gastroesophageal reflux disease without esophagitis Medication refilled per patient request. Lifestyle modification:  wt loss, avoid meals 2-3h before bedtime. Consider eliminating food triggers:  chocolate, caffeine, EtOH, acid/spicy food.  - pantoprazole (PROTONIX) 40 MG tablet; Take  1 tablet 2 x / day  to Prevent Heartburn &  Indigestion  Dispense: 180 tablet; Refill: 3  Migraine without aura and without status migrainosus, not intractable Medication refilled per patient request. Stay well hydrated. Avoid triggers   - SUMAtriptan (IMITREX) 25 MG tablet; Take 1-2 tab with onset of migraine. May repeat in 2 hours if headache persists. Max 4 tabs in 24 hours.  Dispense: 30 tablet; Refill: 0  Medication management All medications discussed and reviewed in full. All questions and concerns regarding medications addressed.    Notify office for further evaluation and treatment, questions or concerns if s/s fail to improve. The risks and benefits of my recommendations, as well as other treatment options were discussed with the patient today. Questions were answered.  Further disposition pending results of labs. Discussed med's effects and SE's.    Over  20 minutes of exam, counseling, chart review, and critical decision making was performed.   Future Appointments  Date Time Provider [place]  10/24/2022 11:00 AM Darrol Jump, NP [facility] None  01/30/2023 10:30 AM Unk Pinto, MD [facility] None  05/06/2023 10:00 AM Darrol Jump, NP [facility] None  09/01/2023  1:45 PM [name], NP [facility] None    ------------------------------------------------------------------------------------------------------------------   HPI BP (!) 122/50   Pulse 98   Temp (!) 97.5 F (36.4 C)   Ht 5' 6.5" (1.689 m)   Wt 181 lb 9.6 oz (82.4 kg)   SpO2 98%   BMI 28.87 kg/m   71 y.o.female presents for two week follow up after having Covid.  She saw Pulmonology 09/26/22, Dr. Erin Fulling for ongoing dyspnea.  Differential includes reactive airways disease vs obstructive lung disease.  She shares today she may have lived in an asbestos home many years ago in [place].  She is not a smoker.  She continues to have SOB with upper productive congestion with cough.  Feels as though mucus is thick and sometimes strangling.    She was prescribed Advair.  She has been using as directed.  Having to use Albuterol only during a coughing attack, sometimes >2 x week.  She has scheduled PFTs in 2 mo with Dr. Posey Pronto.  She is hesitant to wait that long and requesting to be seen by someone sooner, if possible    She does not take a daily antihistamine.  She one used Singular but did not feel as though it helped, although she did not use with inhalers.    She is on cholesterol medication and denies Repatha and requesting a refill today. Her cholesterol is not at goal. The cholesterol  last visit was:   Lab Results  Component Value Date   CHOL 207 (H) 07/24/2022   HDL 60 07/24/2022   LDLCALC 115 (H) 07/24/2022   TRIG 205 (H) 07/24/2022   CHOLHDL 3.5 07/24/2022   She has a hx of GERD.  Well managed by lifestyle modifications and pantoprazole.  She is requesting a  refill today.   She also notes a hx of migraines.  She takes Imitrex for abortive tmt and finds this effective.  Shei s requesting a refill today,  Past Medical History:  Diagnosis Date   Adnexal mass 04/08/2020   Anemia    Depression    Extrinsic asthma 11/09/2014   Gait abnormality 12/26/2019   GERD (gastroesophageal reflux disease)    Hay fever    Head trauma    IBS (irritable bowel syndrome)    Memory loss    Migraines    OSA on CPAP    Reflux    RLS (restless legs syndrome)    Sinus congestion      Allergies  Allergen Reactions   Erythromycin Other (See Comments)    TBS   Iodinated Contrast Media Other (See Comments)    Hot flashes, itching scalp Hot flashes, itching scalp   Penicillins Hives   Clarithromycin Other (See Comments)    Itching ankles   Brintellix [Vortioxetine]     Itching   Cholestatin     Current Outpatient Medications on File Prior to Visit  Medication Sig   acetaminophen (TYLENOL) 650 MG CR tablet Take 650 mg by mouth every 8 (eight) hours as needed for pain.   albuterol (VENTOLIN HFA) 108 (90 Base) MCG/ACT inhaler Inhale 2 puffs into the lungs every 6 (six) hours as needed for wheezing or shortness of breath.   ASPIRIN 81 PO Aspirin   cholecalciferol (VITAMIN D3) 25 MCG (1000 UNIT) tablet Take 4,000 Units by mouth daily.   Cyanocobalamin 2500 MCG SUBL Place 1 tablet under the tongue daily.   FLUoxetine (PROZAC) 10 MG capsule Take 2 capsules ( 20 mg) Daily for Mood                           /                                                      Take                               by                          mouth   fluticasone (FLONASE) 50 MCG/ACT nasal spray 2 prays each nostril once a day   fluticasone-salmeterol (ADVAIR DISKUS) 250-50 MCG/ACT AEPB Inhale 1 puff into the lungs in the morning and at bedtime.   lamoTRIgine (LAMICTAL) 150 MG tablet Take  1 tablet  Daily  for  Mood                          /  Take                               by                          mouth   methylphenidate 18 MG PO CR tablet Take 18 mg by mouth daily.   ondansetron (ZOFRAN-ODT) 8 MG disintegrating tablet DISSOLVE 1 TABLET ON THE  TONGUE EVERY 8 HOURS AS  NEEDED FOR NAUSEA AND  VOMITING   Oxymetazoline HCl (NASAL SPRAY NA) Place into the nose.   sodium chloride (OCEAN) 0.65 % nasal spray Place into the nose.   SUCRALFATE PO Take 1 g by mouth as needed.   No current facility-administered medications on file prior to visit.    ROS: all negative except what is noted in the HPI.   Physical Exam:  BP (!) 122/50   Pulse 98   Temp (!) 97.5 F (36.4 C)   Ht 5' 6.5" (1.689 m)   Wt 181 lb 9.6 oz (82.4 kg)   SpO2 98%   BMI 28.87 kg/m   General Appearance: NAD.  Awake, conversant and cooperative. Eyes: PERRLA, EOMs intact.  Sclera white.  Conjunctiva without erythema. Sinuses: No frontal/maxillary tenderness.  No nasal discharge. Nares patent.  ENT/Mouth: Ext aud canals clear.  Bilateral TMs w/DOL and without erythema or bulging. Hearing intact.  Posterior pharynx without swelling or exudate.  Tonsils without swelling or erythema.  Neck: Supple.  No masses, nodules or thyromegaly. Respiratory: Effort is regular with non-labored breathing. Breath sounds are equal bilaterally without rales, rhonchi, wheezing or stridor.  Cardio: RRR with no MRGs. Brisk peripheral pulses without edema.  Abdomen: Active BS in all four quadrants.  Soft and non-tender without guarding, rebound tenderness, hernias or masses. Lymphatics: Non tender without lymphadenopathy.  Musculoskeletal: Full ROM, 5/5 strength, normal ambulation.  No clubbing or cyanosis. Skin: Appropriate color for ethnicity. Warm without rashes, lesions, ecchymosis, ulcers.  Neuro: CN II-XII grossly normal. Normal muscle tone without cerebellar symptoms and intact sensation.   Psych: AO X 3,  appropriate mood and affect, insight and judgment.     Darrol Jump, NP 12:17 PM [facility]

## 2022-10-01 NOTE — Patient Instructions (Signed)
Shortness of Breath, Adult Shortness of breath means you have trouble breathing. Shortness of breath could be a sign of a medical problem. Follow these instructions at home:  Pollution Do not smoke or use any products that contain nicotine or tobacco. If you need help quitting, ask your doctor. Avoid things that can make it harder to breathe, such as: Smoke of all kinds. This includes smoke from campfires or forest fires. Do not smoke or allow others to smoke in your home. Mold. Dust. Air pollution. Chemical smells. Things that can give you an allergic reaction (allergens) if you have allergies. Keep your living space clean. Use products that help remove mold and dust. General instructions Watch for any changes in your symptoms. Take over-the-counter and prescription medicines only as told by your doctor. This includes oxygen therapy and inhaled medicines. Rest as needed. Return to your normal activities when your doctor says that it is safe. Keep all follow-up visits. Contact a doctor if: Your condition does not get better as soon as expected. You have a hard time doing your normal activities, even after you rest. You have new symptoms. You cannot walk up stairs. You cannot exercise the way you normally do. Get help right away if: Your shortness of breath gets worse. You have trouble breathing when you are resting. You feel light-headed or you faint. You have a cough that is not helped by medicines. You cough up blood. You have pain with breathing. You have pain in your chest, arms, shoulders, or belly (abdomen). You have a fever. These symptoms may be an emergency. Get help right away. Call 911. Do not wait to see if the symptoms will go away. Do not drive yourself to the hospital. Summary Shortness of breath is when you have trouble breathing enough air. It can be a sign of a medical problem. Avoid things that make it hard for you to breathe, such as smoking, pollution,  mold, and dust. Watch for any changes in your symptoms. Contact your doctor if you do not get better or you get worse. This information is not intended to replace advice given to you by your health care provider. Make sure you discuss any questions you have with your health care provider. Document Revised: 03/23/2021 Document Reviewed: 03/23/2021 Elsevier Patient Education  [employer].

## 2022-10-02 ENCOUNTER — Encounter: Payer: Self-pay | Admitting: Neurology

## 2022-10-02 DIAGNOSIS — G4733 Obstructive sleep apnea (adult) (pediatric): Secondary | ICD-10-CM

## 2022-10-03 ENCOUNTER — Other Ambulatory Visit: Payer: Self-pay | Admitting: Nurse Practitioner

## 2022-10-03 ENCOUNTER — Telehealth: Payer: Self-pay | Admitting: Nurse Practitioner

## 2022-10-03 MED ORDER — MIRABEGRON ER 25 MG PO TB24: 25.0000 mg | ORAL_TABLET | Freq: Every day | ORAL | 2 refills | Status: DC

## 2022-10-03 NOTE — Telephone Encounter (Signed)
Please send refill for Mirabegron ER to Krist[facility] Oppen[facility] at [place]. Epic shows that you did this on February 14th but it's listed as "sample" instead of going to the pharmacy.

## 2022-10-06 NOTE — Telephone Encounter (Signed)
Community message sent to [facility]

## 2022-10-15 ENCOUNTER — Telehealth: Payer: Self-pay

## 2022-10-15 NOTE — Progress Notes (Signed)
Patient: Kimberly Ray           DOB: 08/24/1950              Time: 10:20 AM  HC reviewing patients chart prior to assessment call. Reviewing office visits, consults, hospital visits, labs and medication changes. Chart review complete. HC spoke with pt and completed assessment call. PHQ-9 = 14. Pt feels symptoms have remained the same over the past 3 months.   Total time spent - 47mn

## 2022-10-16 DIAGNOSIS — E782 Mixed hyperlipidemia: Secondary | ICD-10-CM | POA: Diagnosis not present

## 2022-10-16 DIAGNOSIS — F339 Major depressive disorder, recurrent, unspecified: Secondary | ICD-10-CM

## 2022-10-16 DIAGNOSIS — E781 Pure hyperglyceridemia: Secondary | ICD-10-CM | POA: Diagnosis not present

## 2022-10-16 DIAGNOSIS — E669 Obesity, unspecified: Secondary | ICD-10-CM

## 2022-10-17 ENCOUNTER — Telehealth: Payer: Self-pay | Admitting: Pharmacist

## 2022-10-17 NOTE — Telephone Encounter (Signed)
Follow up call to pt re: weight loss hand out given to her by Naida Sleight, [place] at December visit. Pt reports that she found card given to her by Naida Sleight which was a card for [employer], CPP to schedule an appt for weight loss services. Encouraged pt to reach out to number listed and schedule appt with [facility], CPP so he would be able to give her options for her weight loss goals. Pt states she will call next week for an appt. Will follow up with pt as scheduled.

## 2022-10-20 ENCOUNTER — Encounter: Payer: Self-pay | Admitting: Nurse Practitioner

## 2022-10-20 ENCOUNTER — Ambulatory Visit (INDEPENDENT_AMBULATORY_CARE_PROVIDER_SITE_OTHER): Payer: Medicare Other | Admitting: Nurse Practitioner

## 2022-10-20 VITALS — BP 114/62 | HR 83 | Temp 97.9°F | Ht 66.5 in | Wt 180.4 lb

## 2022-10-20 DIAGNOSIS — E538 Deficiency of other specified B group vitamins: Secondary | ICD-10-CM

## 2022-10-20 DIAGNOSIS — N1831 Chronic kidney disease, stage 3a: Secondary | ICD-10-CM | POA: Diagnosis not present

## 2022-10-20 DIAGNOSIS — R0602 Shortness of breath: Secondary | ICD-10-CM | POA: Diagnosis not present

## 2022-10-20 DIAGNOSIS — R42 Dizziness and giddiness: Secondary | ICD-10-CM

## 2022-10-20 DIAGNOSIS — I951 Orthostatic hypotension: Secondary | ICD-10-CM

## 2022-10-20 DIAGNOSIS — Z862 Personal history of diseases of the blood and blood-forming organs and certain disorders involving the immune mechanism: Secondary | ICD-10-CM | POA: Diagnosis not present

## 2022-10-20 NOTE — Progress Notes (Signed)
Assessment and Plan:  Shakari was seen today for dizziness.  Diagnoses and all orders for this visit:  History of iron deficiency anemia Will check levels and treat pending results -     Iron, Total/Total Iron Binding Cap -     Ferritin  B12 deficiency Continue current supplementation and will adjust dosage depending on results -     Vitamin B12  Stage 3a chronic kidney disease (HCC) Increase fluids, avoid NSAIDS, monitor sugars, will monitor -     CBC with Differential/Platelet -     COMPLETE METABOLIC PANEL WITH GFR  Feeling faint/Orthostatic Hypotension Change position slowly Make sure to stay well hydrated- feels slightly dehydrated since starting Myrbetriq   SOB (shortness of breath) Continue inhalers Continue to follow with pulmonology Go to the ER if any chest pain, shortness of breath, nausea, dizziness, severe HA, changes vision/speech  Palpitations Followed by cardiology Regular heart rhythm on exam today If persists follow up with Dr. Harriet Masson Go to the ER if any chest pain, shortness of breath, nausea, dizziness, severe HA, changes vision/speech    Further disposition pending results of labs. Discussed med's effects and SE's.   Over 30 minutes of exam, counseling, chart review, and critical decision making was performed.   Future Appointments  Date Time Provider [place]  10/24/2022 11:00 AM Darrol Jump, NP [facility] None  01/30/2023 10:30 AM Unk Pinto, MD [facility] None  05/06/2023 10:00 AM Darrol Jump, NP [facility] None  09/01/2023  1:45 PM [name], NP [facility] None    ------------------------------------------------------------------------------------------------------------------   HPI BP 114/62   Pulse 83   Temp 97.9 F (36.6 C)   Ht 5' 6.5" (1.689 m)   Wt 180 lb 6.4 oz (81.8 kg)   SpO2 97%   BMI 28.68 kg/m   72 y.o.female presents for feeling faint and heart palpitations over the past week. Had a cough but has  improved with inhalers given by pulmonology. Wearing CPAP consistently. Has persistent fatigue and weakness. States when she changes her position she feels faint at times. She is followed by cardiology with last visit 08/07/2022 and neurology with last visit 09/02/22 She is not on BP meds. BP Readings from Last 3 Encounters:  10/20/22 114/62  10/01/22 (!) 122/50  09/26/22 124/76  Orthostatic BP's today:  Lying - 110/62     Sitting- 122/70     Standing- 100/58      She has been using Myrbetriq and it has helped but has not been drinking very much water. Believes she is getting dehydrated.   She does have a history of iron deficiency anemia. Not currently on iron Lab Results  Component Value Date   IRON 73 08/08/2021   TIBC 525 (H) 08/08/2021   FERRITIN 37 08/08/2021    She has Vit B12 deficiency and takes B12 2500 mcg sublingual daily Lab Results  Component Value Date   VITAMINB12 1,022 12/12/2021     BMI is Body mass index is 28.68 kg/m., she has not been working on diet and exercise. Wt Readings from Last 3 Encounters:  10/20/22 180 lb 6.4 oz (81.8 kg)  10/01/22 181 lb 9.6 oz (82.4 kg)  09/26/22 180 lb (81.6 kg)     Past Medical History:  Diagnosis Date   Adnexal mass 04/08/2020   Anemia    Depression    Extrinsic asthma 11/09/2014   Gait abnormality 12/26/2019   GERD (gastroesophageal reflux disease)    Hay fever    Head trauma  IBS (irritable bowel syndrome)    Memory loss    Migraines    OSA on CPAP    Reflux    RLS (restless legs syndrome)    Sinus congestion      Allergies  Allergen Reactions   Erythromycin Other (See Comments)    TBS   Iodinated Contrast Media Other (See Comments)    Hot flashes, itching scalp Hot flashes, itching scalp   Penicillins Hives   Clarithromycin Other (See Comments)    Itching ankles   Brintellix [Vortioxetine]     Itching   Cholestatin     Current Outpatient Medications on File Prior to Visit  Medication Sig    acetaminophen (TYLENOL) 650 MG CR tablet Take 650 mg by mouth every 8 (eight) hours as needed for pain.   albuterol (VENTOLIN HFA) 108 (90 Base) MCG/ACT inhaler Inhale 2 puffs into the lungs every 6 (six) hours as needed for wheezing or shortness of breath.   ASPIRIN 81 PO Aspirin   cholecalciferol (VITAMIN D3) 25 MCG (1000 UNIT) tablet Take 4,000 Units by mouth daily.   Cyanocobalamin 2500 MCG SUBL Place 1 tablet under the tongue daily.   Evolocumab (REPATHA) 140 MG/ML SOSY Inject 1 mL into the skin every 14 (fourteen) days.   FLUoxetine (PROZAC) 10 MG capsule Take 2 capsules ( 20 mg) Daily for Mood                           /                                                      Take                               by                          mouth   fluticasone (FLONASE) 50 MCG/ACT nasal spray 2 prays each nostril once a day   fluticasone-salmeterol (ADVAIR DISKUS) 250-50 MCG/ACT AEPB Inhale 1 puff into the lungs in the morning and at bedtime.   lamoTRIgine (LAMICTAL) 150 MG tablet Take  1 tablet  Daily  for  Mood                          /                                                      Take                               by                          mouth   methylphenidate 18 MG PO CR tablet Take 18 mg by mouth daily.   mirabegron ER (MYRBETRIQ) 25 MG TB24 tablet Take 1 tablet (25 mg total) by mouth daily.   ondansetron (ZOFRAN-ODT) 8 MG disintegrating tablet  DISSOLVE 1 TABLET ON THE  TONGUE EVERY 8 HOURS AS  NEEDED FOR NAUSEA AND  VOMITING   Oxymetazoline HCl (NASAL SPRAY NA) Place into the nose.   pantoprazole (PROTONIX) 40 MG tablet Take  1 tablet 2 x / day  to Prevent Heartburn &  Indigestion   sodium chloride (OCEAN) 0.65 % nasal spray Place into the nose.   SUMAtriptan (IMITREX) 25 MG tablet Take 1-2 tab with onset of migraine. May repeat in 2 hours if headache persists. Max 4 tabs in 24 hours.   SUCRALFATE PO Take 1 g by mouth as needed. (Patient not taking: Reported on 10/20/2022)   No  current facility-administered medications on file prior to visit.    ROS: all negative except above.   Physical Exam:  BP 114/62   Pulse 83   Temp 97.9 F (36.6 C)   Ht 5' 6.5" (1.689 m)   Wt 180 lb 6.4 oz (81.8 kg)   SpO2 97%   BMI 28.68 kg/m   General Appearance: Well nourished, in no apparent distress. Eyes: PERRLA, EOMs, conjunctiva no swelling or erythema Neck: Supple, thyroid normal.  Respiratory: Respiratory effort normal, BS equal bilaterally without rales, rhonchi, wheezing or stridor.  Cardio: RRR with no MRGs. Brisk peripheral pulses without edema.  Abdomen: Soft, + BS.  Non tender, no guarding, rebound, hernias, masses. Musculoskeletal: Full ROM, 5/5 strength, normal gait.  Skin: Warm, dry without rashes, lesions, ecchymosis.  Neuro: Cranial nerves intact. Normal muscle tone, no cerebellar symptoms. Sensation intact.  Psych: Awake and oriented X 3, normal affect, Insight and Judgment appropriate.     Alycia Rossetti, NP 2:49 PM [facility]

## 2022-10-20 NOTE — Patient Instructions (Signed)
Hypotension As your heart beats, it forces blood through your body. This force is called blood pressure. If you have hypotension, you have low blood pressure.  When your blood pressure is too low, you may not get enough blood to your brain or other parts of your body. This may cause you to feel weak, light-headed, have a fast heartbeat, or even faint. Low blood pressure may be harmless, or it may cause serious problems. What are the causes? Blood loss. Not enough water in the body (dehydration). Heart problems. Hormone problems. Pregnancy. A very bad infection. Not having enough of certain nutrients. Very bad allergic reactions. Certain medicines. What increases the risk? Age. The risk increases as you get older. Conditions that affect the heart or the brain and spinal cord (central nervous system). What are the signs or symptoms? Feeling: Weak. Light-headed. Dizzy. Tired (fatigued). Blurred vision. Fast heartbeat. Fainting, in very bad cases. How is this treated? Changing your diet. This may involve drinking more water or including more salt (sodium) in your diet by eating high-salt foods. Taking medicines to raise your blood pressure. Changing how much you take (the dosage) of some of your medicines. Wearing compression stockings. These stockings help to prevent blood clots and reduce swelling in your legs. In some cases, you may need to go to the hospital to: Receive fluids through an IV tube. Receive donated blood through an IV tube (transfusion). Get treated for an infection or heart problems, if this applies. Be monitored while medicines that you are taking wear off. Follow these instructions at home: Eating and drinking  Drink enough fluids to keep your pee (urine) pale yellow. Eat a healthy diet. Follow instructions from your doctor about what you can eat or drink. A healthy diet includes: Fresh fruits and vegetables. Whole grains. Low-fat (lean) meats. Low-fat  dairy products. If told, include more salt in your diet. Do not add extra salt to your diet unless your doctor tells you to. Eat small meals often. Avoid standing up quickly after you eat. Medicines Take over-the-counter and prescription medicines only as told by your doctor. Follow instructions from your doctor about changing how much you take of your medicines, if this applies. Do not stop or change any of your medicines on your own. General instructions  Wear compression stockings as told by your doctor. Get up slowly from lying down or sitting. Avoid hot showers and a lot of heat as told by your doctor. Return to your normal activities when your doctor says that it is safe. Do not smoke or use any products that contain nicotine or tobacco. If you need help quitting, ask your doctor. Keep all follow-up visits. Contact a doctor if: You vomit. You have watery poop (diarrhea). You have a fever for more than 2-3 days. You feel more thirsty than normal. You feel weak and tired. Get help right away if: You have chest pain. You have a fast or uneven heartbeat. You lose feeling (have numbness) in any part of your body. You cannot move your arms or your legs. You have trouble talking. You get sweaty or feel light-headed. You faint. You have trouble breathing. You have trouble staying awake. You feel mixed up (confused). These symptoms may be an emergency. Get help right away. Call 911. Do not wait to see if the symptoms will go away. Do not drive yourself to the hospital. Summary Hypotension is also called low blood pressure. It is when the force of blood pumping through your body   is too weak. Hypotension may be harmless, or it may cause serious problems. Treatment may include changing your diet and medicines, and wearing compression stockings. In very bad cases, you may need to go to the hospital. This information is not intended to replace advice given to you by your health care  provider. Make sure you discuss any questions you have with your health care provider. Document Revised: 03/25/2021 Document Reviewed: 03/25/2021 [employer] Patient Education  2023 [employer]  

## 2022-10-21 LAB — COMPLETE METABOLIC PANEL WITH GFR
AG Ratio: 1.9 (calc) (ref 1.0–2.5)
ALT: 43 U/L — ABNORMAL HIGH (ref 6–29)
AST: 66 U/L — ABNORMAL HIGH (ref 10–35)
Albumin: 4.4 g/dL (ref 3.6–5.1)
Alkaline phosphatase (APISO): 79 U/L (ref 37–153)
BUN/Creatinine Ratio: 14 (calc) (ref 6–22)
BUN: 17 mg/dL (ref 7–25)
CO2: 23 mmol/L (ref 20–32)
Calcium: 9.9 mg/dL (ref 8.6–10.4)
Chloride: 106 mmol/L (ref 98–110)
Creat: 1.22 mg/dL — ABNORMAL HIGH (ref 0.60–1.00)
Globulin: 2.3 g/dL (calc) (ref 1.9–3.7)
Glucose, Bld: 132 mg/dL — ABNORMAL HIGH (ref 65–99)
Potassium: 5 mmol/L (ref 3.5–5.3)
Sodium: 142 mmol/L (ref 135–146)
Total Bilirubin: 0.4 mg/dL (ref 0.2–1.2)
Total Protein: 6.7 g/dL (ref 6.1–8.1)
eGFR: 47 mL/min/{1.73_m2} — ABNORMAL LOW (ref >=60)

## 2022-10-21 LAB — CBC WITH DIFFERENTIAL/PLATELET
Absolute Monocytes: 551 cells/uL (ref 200–950)
Basophils Absolute: 52 cells/uL (ref 0–200)
Basophils Relative: 0.9 %
Eosinophils Absolute: 93 cells/uL (ref 15–500)
Eosinophils Relative: 1.6 %
HCT: 34.7 % — ABNORMAL LOW (ref 35.0–45.0)
Hemoglobin: 11 g/dL — ABNORMAL LOW (ref 11.7–15.5)
Lymphs Abs: 1357 cells/uL (ref 850–3900)
MCH: 25.9 pg — ABNORMAL LOW (ref 27.0–33.0)
MCHC: 31.7 g/dL — ABNORMAL LOW (ref 32.0–36.0)
MCV: 81.6 fL (ref 80.0–100.0)
MPV: 10 fL (ref 7.5–12.5)
Monocytes Relative: 9.5 %
Neutro Abs: 3747 cells/uL (ref 1500–7800)
Neutrophils Relative %: 64.6 %
Platelets: 332 10*3/uL (ref 140–400)
RBC: 4.25 10*6/uL (ref 3.80–5.10)
RDW: 14.1 % (ref 11.0–15.0)
Total Lymphocyte: 23.4 %
WBC: 5.8 10*3/uL (ref 3.8–10.8)

## 2022-10-21 LAB — VITAMIN B12: Vitamin B-12: >2000 pg/mL — ABNORMAL HIGH (ref 200–1100)

## 2022-10-21 LAB — IRON, TOTAL/TOTAL IRON BINDING CAP
%SAT: 7 % (calc) — ABNORMAL LOW (ref 16–45)
Iron: 30 ug/dL — ABNORMAL LOW (ref 45–160)
TIBC: 413 mcg/dL (calc) (ref 250–450)

## 2022-10-21 LAB — FERRITIN: Ferritin: 25 ng/mL (ref 16–288)

## 2022-10-23 DIAGNOSIS — G4733 Obstructive sleep apnea (adult) (pediatric): Secondary | ICD-10-CM | POA: Diagnosis not present

## 2022-10-24 ENCOUNTER — Encounter: Payer: Self-pay | Admitting: Nurse Practitioner

## 2022-10-24 ENCOUNTER — Ambulatory Visit (INDEPENDENT_AMBULATORY_CARE_PROVIDER_SITE_OTHER): Payer: Medicare Other | Admitting: Nurse Practitioner

## 2022-10-24 VITALS — BP 110/68 | HR 74 | Temp 98.1°F | Ht 66.5 in | Wt 179.8 lb

## 2022-10-24 DIAGNOSIS — R0989 Other specified symptoms and signs involving the circulatory and respiratory systems: Secondary | ICD-10-CM | POA: Diagnosis not present

## 2022-10-24 DIAGNOSIS — N1831 Chronic kidney disease, stage 3a: Secondary | ICD-10-CM

## 2022-10-24 DIAGNOSIS — Z862 Personal history of diseases of the blood and blood-forming organs and certain disorders involving the immune mechanism: Secondary | ICD-10-CM

## 2022-10-24 DIAGNOSIS — Z0001 Encounter for general adult medical examination with abnormal findings: Secondary | ICD-10-CM

## 2022-10-24 DIAGNOSIS — E559 Vitamin D deficiency, unspecified: Secondary | ICD-10-CM

## 2022-10-24 DIAGNOSIS — G2581 Restless legs syndrome: Secondary | ICD-10-CM

## 2022-10-24 DIAGNOSIS — K760 Fatty (change of) liver, not elsewhere classified: Secondary | ICD-10-CM

## 2022-10-24 DIAGNOSIS — E669 Obesity, unspecified: Secondary | ICD-10-CM

## 2022-10-24 DIAGNOSIS — R6889 Other general symptoms and signs: Secondary | ICD-10-CM

## 2022-10-24 DIAGNOSIS — F339 Major depressive disorder, recurrent, unspecified: Secondary | ICD-10-CM

## 2022-10-24 DIAGNOSIS — N3941 Urge incontinence: Secondary | ICD-10-CM

## 2022-10-24 DIAGNOSIS — K219 Gastro-esophageal reflux disease without esophagitis: Secondary | ICD-10-CM | POA: Diagnosis not present

## 2022-10-24 DIAGNOSIS — Z0000 Encounter for general adult medical examination without abnormal findings: Secondary | ICD-10-CM

## 2022-10-24 DIAGNOSIS — R7303 Prediabetes: Secondary | ICD-10-CM

## 2022-10-24 DIAGNOSIS — G4733 Obstructive sleep apnea (adult) (pediatric): Secondary | ICD-10-CM | POA: Diagnosis not present

## 2022-10-24 DIAGNOSIS — E66811 Obesity, class 1: Secondary | ICD-10-CM

## 2022-10-24 DIAGNOSIS — I340 Nonrheumatic mitral (valve) insufficiency: Secondary | ICD-10-CM

## 2022-10-24 DIAGNOSIS — R413 Other amnesia: Secondary | ICD-10-CM

## 2022-10-24 DIAGNOSIS — E782 Mixed hyperlipidemia: Secondary | ICD-10-CM

## 2022-10-24 DIAGNOSIS — F988 Other specified behavioral and emotional disorders with onset usually occurring in childhood and adolescence: Secondary | ICD-10-CM

## 2022-10-24 DIAGNOSIS — M8580 Other specified disorders of bone density and structure, unspecified site: Secondary | ICD-10-CM | POA: Diagnosis not present

## 2022-10-24 NOTE — Progress Notes (Signed)
ANNUAL WELLNESS VISIT AND FOLLOW UP   Assessment:    Encounter for Annual Physical Exam with abnormal findings Due annually  Health Maintenance reviewed Healthy lifestyle reviewed and goals set  Hyperlipidemia Discussed lifestyle modifications. Recommended diet heavy in fruits and veggies, omega 3's. Decrease consumption of animal meats, cheeses, and dairy products. Remain active and exercise as tolerated. Continue to monitor.  Prediabetes Education: Reviewed 'ABCs' of diabetes management  Discussed goals to be met and/or maintained include A1C (<7) Blood pressure (<130/80) Cholesterol (LDL <70) Continue Eye Exam yearly  Continue Dental Exam Q6 mo Discussed dietary recommendations Discussed Physical Activity recommendations Check A1C  Labile HTN Discussed DASH (Dietary Approaches to Stop Hypertension) DASH diet is lower in sodium than a typical American diet. Cut back on foods that are high in saturated fat, cholesterol, and trans fats. Eat more whole-grain foods, fish, poultry, and nuts Remain active and exercise as tolerated daily.  Monitor BP at home-Call if greater than 130/80.  Check CMP/CBC  Vitamin D deficiency Continue supplement Check vitamin D level  Osteopenia Pursue a combination of weight-bearing exercises and strength training. Patients with severe mobility impairment should be referred for physical therapy. Advised on fall prevention measures including proper lighting in all rooms, removal of area rugs and floor clutter, use of walking devices as deemed appropriate, avoidance of uneven walking surfaces. Smoking cessation and moderate alcohol consumption if applicable Consume [id number] to 1000 IU of vitamin D daily with a goal vitamin D serum value of 30 ng/mL or higher. Aim for 1000 to 1200 mg of elemental calcium daily through supplements and/or dietary sources.  ADD (attention deficit disorder) Off of medication since retirement;  Psych following  CKD  3 ([place]) Discussed how what you eat and drink can aide in kidney protection. Stay well hydrated. Avoid high salt foods. Avoid NSAIDS. Keep BP and BG well controlled.   Take medications as prescribed. Remain active and exercise as tolerated daily. Maintain weight.  Continue to monitor. Check CMP/GFR/Microablumin  Fatty liver Weight loss advised, avoid alcohol/tylenol, will monitor LFTs  OSA on CPAP Endorses 100 % compliance Monitor weight  Gastroesophageal reflux disease, esophagitis presence not specified No suspected reflux complications ([place]/stricture). Lifestyle modification:  wt loss, avoid meals 2-3h before bedtime. Consider eliminating food triggers:  chocolate, caffeine, EtOH, acid/spicy food.  RLS (restless legs syndrome) Controlled Continue to monitor  Recurrent Major depression, chronic (HCC) Managed by Pscyh Lifestyle discussed: diet/exerise, sleep hygiene, stress management, hydration  Obesity - BMI 30 Discussed appropriate BMI Diet modification. Physical activity. Encouraged/praised to build confidence.  Memory changes Stable  R/t severe depression, neuro/psych following She will follow up with psych for management of severe mood Hallucinations resolved off of requip/gabapentin  Nonrheumatic mitral valve regurgitation Dr. Sherren Mocha following, planning 6-9 month ECHO follow up  History of IDA Continue supplement Stool card provided Discussed need for UTD colonoscopy  Urge incontinence Myrbetriq not working Refer to URO/GYN for further review and evaluation of pelvic floor condition.  Orders Placed This Encounter  Procedures   Ambulatory referral to Urogynecology    Referral Priority:   Routine    Referral Type:   Consultation    Referral Reason:   Specialty Services Required    Requested Specialty:   Urology    Number of Visits Requested:   1   Blood work completed 10/20/22.  Reviewed and discussed with patient.  All questions and concerns  addressed.    Notify office for further evaluation and treatment, questions or concerns if  any reported s/s fail to improve.   The patient was advised to call back or seek an in-person evaluation if any symptoms worsen or if the condition fails to improve as anticipated.   Further disposition pending results of labs. Discussed med's effects and SE's.    I discussed the assessment and treatment plan with the patient. The patient was provided an opportunity to ask questions and all were answered. The patient agreed with the plan and demonstrated an understanding of the instructions.  Discussed med's effects and SE's. Screening labs and tests as requested with regular follow-up as recommended.  I provided 40 minutes of face-to-face time during this encounter including counseling, chart review, and critical decision making was preformed.  Today's Plan of Care is based on a patient-centered health care approach known as shared decision making - the decisions, tests and treatments allow for patient preferences and values to be balanced with clinical evidence.    Future Appointments  Date Time Provider [place]  01/30/2023 10:30 AM Unk Pinto, MD [facility] None  05/06/2023 10:00 AM Darrol Jump, NP [facility] None  09/01/2023  1:45 PM [name], NP [facility] None  11/03/2023 11:00 AM Darrol Jump, NP [facility] None     Plan:   During the course of the visit the patient was educated and counseled about appropriate screening and preventive services including:   Pneumococcal vaccine  Prevnar 13 Influenza vaccine Td vaccine Screening electrocardiogram Bone densitometry screening Colorectal cancer screening Diabetes screening Glaucoma screening Nutrition counseling  Advanced directives: requested   Subjective:  Kimberly Ray is a 72 y.o. female who presents for Annual Wellness and follow up. She has Mixed hyperlipidemia; ADD (attention deficit disorder);  OSA on CPAP; Major depression, recurrent, chronic ([place]); Vitamin D deficiency; GERD ; Obesity (BMI 30.0-34.9); Circadian rhythm sleep disorder; Periodic limb movement disorder (PLMD); Fatty liver; Osteopenia; Memory changes; Gait abnormality; RLS (restless legs syndrome); B12 deficiency; Seasonal allergies; Nonrheumatic mitral valve regurgitation; Prediabetes; Hypertriglyceridemia; Stage 3a chronic kidney disease ([place]); SOB (shortness of breath); Chest pain; Medication management; and DOE (dyspnea on exertion) on their problem list.  She is single, retired Programme researcher, broadcasting/film/video.  She had Covid 10/01/22.  Feels possible long covid symptom of fatigue and SOB, however, she has hx of IDA.  She is taking iron supplement and plans to make a updated apt for Colonoscopy.  Denies any gross hematuria in stool or urine.  Concerned for bladder leakage, urge incontinence accompanied by fecal urgency.  She has been taking Myrbetriq which has not helped. She is trying [place] exercises but still not managing with incontinence and urgency of urine.     She has long hx of severe depression, now following with NP Domingo Cocking for meds and Remo Lipps for counseling. She reports medication compliance and effectiveness. Currently taking Lamictal, Concerta.  She was reporting memory changes, visual halluciations, unremarkable MRI 01/2020, was evaluated by Dr. Nicole Kindred for neuropsychological evaluation, felt the issue was more related to executive control function,  depression, aging, attention deficit disorder.  Felt neurodegeneration is less likely. Did recommend to stop requip/gabapentin for RLS which resolved hallucinations. Follows with Butler Denmark, NP as needed.   Discussing right knee replacement with Dr. Wynelle Link, had TKA right knee 05/2022. Reports doing well.  She has OSA on CPAP, endorses 100% compliance and feels this has helped RLS. Typically wears around 7-8 hours each night.  Has GERD and well managed on famotidine BID.    BMI is Body mass index is 28.59 kg/m., she has not  been working on diet and exercise d/t increase in SOB, fatigue.  Wt Readings from Last 3 Encounters:  10/24/22 179 lb 12.8 oz (81.6 kg)  10/20/22 180 lb 6.4 oz (81.8 kg)  10/01/22 181 lb 9.6 oz (82.4 kg)   She does not check BP at home, today their BP is BP: 110/68 She does workout. She denies chest pain, dizziness.   She was having exertional dyspnea/chest pain, saw cardiologist Dr. Harriet Masson 08/2021, had CT coronary 09/20/21 with CAD RADS 0, ECHO 09/13/2021 showed EF 55-60% but did show MV with mild posterior leaflet prolapse, mild thickening of leaflets, moderate mitral annular calcification, planning for 6-9 month ECHO follow up.   She is on cholesterol medication (on zetia 10 mg, off of fenofibrate and started on vascepa, cardiology is planning recheck) and denies myalgias.  Currently treated with Repatha.  Her cholesterol is not at goal. The cholesterol last visit was:   Lab Results  Component Value Date   CHOL 207 (H) 07/24/2022   HDL 60 07/24/2022   LDLCALC 115 (H) 07/24/2022   TRIG 205 (H) 07/24/2022   CHOLHDL 3.5 07/24/2022   She has had prediabetes predating 2014. She has not been working on diet and exercise for prediabetes, and denies increased appetite, nausea, paresthesia of the feet, polydipsia, polyuria, visual disturbances, vomiting and weight loss. Last A1C in the office was:  Lab Results  Component Value Date   HGBA1C 6.2 (H) 07/24/2022   She has stable CKD IIIa monitored at this office. Last GFR: Lab Results  Component Value Date   EGFR 47 (L) 10/20/2022   EGFR 58 (L) 07/24/2022   EGFR 64 07/17/2022   Patient is on Vitamin D supplement and was at goal at last check:    Lab Results  Component Value Date   VD25OH 58 07/24/2022     Taking B12 SL 2500 mcg irregularly. She reports fatigue and requests recheck of B12 today -  Lab Results  Component Value Date   VITAMINB12 >2,000 (H) 10/20/2022     Medication  Review: Current Outpatient Medications on File Prior to Visit  Medication Sig Dispense Refill   acetaminophen (TYLENOL) 650 MG CR tablet Take 650 mg by mouth every 8 (eight) hours as needed for pain.     albuterol (VENTOLIN HFA) 108 (90 Base) MCG/ACT inhaler Inhale 2 puffs into the lungs every 6 (six) hours as needed for wheezing or shortness of breath. 8 g 2   ASPIRIN 81 PO Aspirin     cholecalciferol (VITAMIN D3) 25 MCG (1000 UNIT) tablet Take 4,000 Units by mouth daily.     Cyanocobalamin 2500 MCG SUBL Place 1 tablet under the tongue daily.     Evolocumab (REPATHA) 140 MG/ML SOSY Inject 1 mL into the skin every 14 (fourteen) days. 2.1 mL 6   FLUoxetine (PROZAC) 10 MG capsule Take 2 capsules ( 20 mg) Daily for Mood                           /                                                      Take  by                          mouth 180 capsule 3   fluticasone (FLONASE) 50 MCG/ACT nasal spray 2 prays each nostril once a day 16 g 2   fluticasone-salmeterol (ADVAIR DISKUS) 250-50 MCG/ACT AEPB Inhale 1 puff into the lungs in the morning and at bedtime. 60 each 2   lamoTRIgine (LAMICTAL) 150 MG tablet Take  1 tablet  Daily  for  Mood                          /                                                      Take                               by                          mouth 90 tablet 3   methylphenidate 18 MG PO CR tablet Take 18 mg by mouth daily.     mirabegron ER (MYRBETRIQ) 25 MG TB24 tablet Take 1 tablet (25 mg total) by mouth daily. 90 tablet 2   ondansetron (ZOFRAN-ODT) 8 MG disintegrating tablet DISSOLVE 1 TABLET ON THE  TONGUE EVERY 8 HOURS AS  NEEDED FOR NAUSEA AND  VOMITING 60 tablet 0   Oxymetazoline HCl (NASAL SPRAY NA) Place into the nose.     pantoprazole (PROTONIX) 40 MG tablet Take  1 tablet 2 x / day  to Prevent Heartburn &  Indigestion 180 tablet 3   sodium chloride (OCEAN) 0.65 % nasal spray Place into the nose.     SUCRALFATE PO Take 1 g by  mouth as needed.     SUMAtriptan (IMITREX) 25 MG tablet Take 1-2 tab with onset of migraine. May repeat in 2 hours if headache persists. Max 4 tabs in 24 hours. 30 tablet 0   No current facility-administered medications on file prior to visit.    Allergies  Allergen Reactions   Erythromycin Other (See Comments)    TBS   Iodinated Contrast Media Other (See Comments)    Hot flashes, itching scalp Hot flashes, itching scalp   Penicillins Hives   Clarithromycin Other (See Comments)    Itching ankles   Brintellix [Vortioxetine]     Itching   Cholestatin     Current Problems (verified) Patient Active Problem List   Diagnosis Date Noted   SOB (shortness of breath) 07/17/2022   Chest pain 07/17/2022   Medication management 07/17/2022   DOE (dyspnea on exertion) 07/17/2022   Nonrheumatic mitral valve regurgitation 09/25/2021   Prediabetes 09/25/2021   Hypertriglyceridemia 09/25/2021   Stage 3a chronic kidney disease ([place]) 09/25/2021   Seasonal allergies 02/12/2021   B12 deficiency 04/12/2020   Gait abnormality 12/26/2019   RLS (restless legs syndrome) 12/26/2019   Memory changes 08/04/2019   Osteopenia 04/06/2018   Fatty liver 08/29/2017   Circadian rhythm sleep disorder 03/25/2017   Periodic limb movement disorder (PLMD) 03/25/2017   Obesity (BMI 30.0-34.9) 06/16/2015   Vitamin D deficiency 11/09/2014   GERD  11/09/2014  Major depression, recurrent, chronic ([place]) 06/07/2014   OSA on CPAP    Mixed hyperlipidemia 06/29/2013   ADD (attention deficit disorder) 06/29/2013    Screening Tests Immunization History  Administered Date(s) Administered   Influenza, High Dose Seasonal PF 06/12/2016, 05/13/2017, 06/25/2020   Influenza-Unspecified 06/02/2015, 05/19/2018, 05/19/2019   PFIZER(Purple Top)SARS-COV-2 Vaccination 09/12/2019, 10/03/2019, 04/30/2020, 12/08/2020   PPD Test 08/19/2011, 08/30/2013, 11/09/2014   Pfizer Covid-19 Vaccine Bivalent Booster 77yr & up 05/08/2021    Pneumococcal Conjugate-13 08/18/2009, 02/18/2019   Pneumococcal Polysaccharide-23 06/12/2016   Td 08/19/2003   Tdap 08/30/2013   Zoster Recombinat (Shingrix) 10/07/2021, 12/09/2021   Zoster, Live 08/18/2010   Covid 19: 3/3, 2021, pfizer  PAP: 2019, s/p TAH, done  MGM: 12/2020 R breast likely cyst -1 year follow up, overdue, has seen surgeon, did not recommend surgery, will call to schedule  DEXA: 08/2020 - L fem T -2.3  - will complete with mammogram when scheduled   Colonoscopy: 09/2011 Dr PHilarie Fredrickson 10 year, Due will call to schedule EGD: 2019  Names of Other Physician/Practitioners you currently use: 1. [facility] here for primary care 2. Dr. DIdolina Primer eye doctor, last visit 2023 3. Dr. TTama Gander dentist, last visit 11/2021  Patient Care Team: MUnk Pinto MD as PCP - General (Internal Medicine) TBerniece Salines DO as PCP - Cardiology (Cardiology) MClarene Essex MD as Consulting Physician (Gastroenterology) LRoyston SinnerEColin Benton MD as Consulting Physician (Obstetrics and Gynecology) [facility] (Pharmacist)  SURGICAL HISTORY She  has a past surgical history that includes perforated eardrum; Nasal sinus surgery; Tonsillectomy and adenoidectomy; Breast biopsy (Right, 02/27/2014); Cholecystectomy (11/03/2017); Laparoscopic hysterectomy (12/2017); Ureteroscopy; ORIF ankle fracture bimalleolar (Left, 03/14/2019); and Abdominal hysterectomy. FAMILY HISTORY Her family history includes Anxiety disorder in her brother, father, and sister; Bipolar disorder in her brother; Breast cancer in her cousin; Breast cancer (age of onset: 429 in her maternal aunt; Breast cancer (age of onset: 776 in her maternal grandmother; Colon cancer (age of onset: 691 in her paternal grandmother; Dementia in her father; Depression in her sister; Diabetes in her mother and sister; Heart failure in her father; Heart failure (age of onset: 8108 in her maternal  grandfather; Mental illness in her paternal grandmother; Mood Disorder in her father; Osteoporosis in her brother; Stomach cancer in her maternal aunt. SOCIAL HISTORY She  reports that she has never smoked. She has never used smokeless tobacco. She reports that she does not currently use alcohol. She reports that she does not use drugs.  MEDICARE WELLNESS OBJECTIVES: Physical activity:   Cardiac risk factors:   Depression/mood screen:      10/24/2022   11:50 AM  Depression screen PHQ 2/9  Decreased Interest 0  Down, Depressed, Hopeless 0  PHQ - 2 Score 0    ADLs:     10/24/2022   11:39 AM 12/12/2021    4:43 PM  In your present state of health, do you have any difficulty performing the following activities:  Hearing? 1 0  Comment [facility]   Vision? 0 0  Difficulty concentrating or making decisions? 0 0  Walking or climbing stairs? 1 0  Comment Feeling off balance - has not fallen   Dressing or bathing? 0 0  Doing errands, shopping? 0 0  Preparing Food and eating ? N   Using the Toilet? N   In the past six months, have you accidently leaked urine? N  Do you have problems with loss of bowel control? N   Managing your Medications? N   Managing your Finances? N   Housekeeping or managing your Housekeeping? N      Cognitive Testing  Alert? Yes  Normal Appearance?Yes  Oriented to person? Yes  Place? Yes   Time? Yes  Recall of three objects?  Yes  Can perform simple calculations? Yes  Displays appropriate judgment?Yes  Can read the correct time from a watch face?Yes  EOL planning:       Review of Systems  Constitutional:  Negative for malaise/fatigue and weight loss.  HENT:  Negative for hearing loss and tinnitus.   Eyes:  Negative for blurred vision and double vision.  Respiratory:  Negative for cough, sputum production, shortness of breath and wheezing.   Cardiovascular:  Negative for chest pain, palpitations, orthopnea,  claudication, leg swelling and PND.  Gastrointestinal:  Negative for abdominal pain, blood in stool, constipation, diarrhea, heartburn (improved, avoiding triggers), melena, nausea and vomiting.  Genitourinary:  Negative for dysuria, flank pain, frequency, hematuria and urgency.  Musculoskeletal:  Positive for joint pain (R knee, pain mild, some intermittent instability). Negative for falls and myalgias.  Skin:  Negative for rash.  Neurological:  Negative for dizziness, tingling, sensory change, weakness and headaches.  Endo/Heme/Allergies:  Negative for polydipsia.  Psychiatric/Behavioral:  Positive for depression and memory loss (stable/unchanged). Negative for hallucinations (improved off of requip), substance abuse and suicidal ideas. The patient is not nervous/anxious and does not have insomnia.   All other systems reviewed and are negative.    Objective:     Today's Vitals   10/24/22 1105  BP: 110/68  Pulse: 74  Temp: 98.1 F (36.7 C)  SpO2: 98%  Weight: 179 lb 12.8 oz (81.6 kg)  Height: 5' 6.5" (1.689 m)   Body mass index is 28.59 kg/m.  General appearance: alert, no distress, WD/WN, female HEENT: normocephalic, sclerae anicteric, TMs pearly, nares patent, no discharge or erythema, pharynx normal Oral cavity: MMM, no lesions Neck: supple, no lymphadenopathy, no thyromegaly, no masses Heart: RRR, normal S1, S2, no murmurs Lungs: CTA bilaterally, no wheezes, rhonchi, or rales Abdomen: +bs, soft, non tender, non distended, no masses, no hepatomegaly, no splenomegaly Musculoskeletal: nontender, no swelling, no obvious deformity, gait is slow and antalgic Extremities: no edema, no cyanosis, no clubbing Pulses: 2+ symmetric, upper and lower extremities, normal cap refill Neurological: alert, oriented x 3, CN2-12 intact, strength normal upper extremities and lower extremities, sensation normal throughout, DTRs 2+ throughout, no cerebellar signs, gait slow and antalgic   Psychiatric: Depressed affect, behavior normal, pleasant   Medicare Attestation I have personally reviewed: The patient's medical and social history Their use of alcohol, tobacco or illicit drugs Their current medications and supplements The patient's functional ability including ADLs,fall risks, home safety risks, cognitive, and hearing and visual impairment Diet and physical activities Evidence for depression or mood disorders  The patient's weight, height, BMI, and visual acuity have been recorded in the chart.  I have made referrals, counseling, and provided education to the patient based on review of the above and I have provided the patient with a written personalized care plan for preventive services.      Darrol Jump, NP   10/24/2022

## 2022-10-24 NOTE — Patient Instructions (Signed)

## 2022-11-02 ENCOUNTER — Other Ambulatory Visit: Payer: Self-pay | Admitting: Nurse Practitioner

## 2022-11-02 DIAGNOSIS — J069 Acute upper respiratory infection, unspecified: Secondary | ICD-10-CM

## 2022-11-03 ENCOUNTER — Telehealth: Payer: Self-pay | Admitting: Nurse Practitioner

## 2022-11-03 ENCOUNTER — Other Ambulatory Visit: Payer: Self-pay | Admitting: Nurse Practitioner

## 2022-11-03 ENCOUNTER — Other Ambulatory Visit: Payer: Self-pay

## 2022-11-03 DIAGNOSIS — E782 Mixed hyperlipidemia: Secondary | ICD-10-CM

## 2022-11-03 DIAGNOSIS — G43009 Migraine without aura, not intractable, without status migrainosus: Secondary | ICD-10-CM

## 2022-11-03 MED ORDER — REPATHA 140 MG/ML ~~LOC~~ SOSY: 1.0000 mL | PREFILLED_SYRINGE | SUBCUTANEOUS | 6 refills | Status: DC

## 2022-11-03 NOTE — Telephone Encounter (Signed)
Pt is requesting a refill on repetha at [facility] on [address]

## 2022-11-05 ENCOUNTER — Other Ambulatory Visit (INDEPENDENT_AMBULATORY_CARE_PROVIDER_SITE_OTHER): Payer: Medicare Other

## 2022-11-05 DIAGNOSIS — Z1212 Encounter for screening for malignant neoplasm of rectum: Secondary | ICD-10-CM | POA: Diagnosis not present

## 2022-11-05 DIAGNOSIS — Z1211 Encounter for screening for malignant neoplasm of colon: Secondary | ICD-10-CM

## 2022-11-05 LAB — POC HEMOCCULT BLD/STL (HOME/3-CARD/SCREEN)
Card #2 Fecal Occult Blod, POC: NEGATIVE
Card #3 Fecal Occult Blood, POC: NEGATIVE
Fecal Occult Blood, POC: NEGATIVE

## 2022-11-18 ENCOUNTER — Ambulatory Visit: Payer: Medicare Other | Admitting: Pulmonary Disease

## 2022-11-18 ENCOUNTER — Encounter: Payer: Self-pay | Admitting: Pulmonary Disease

## 2022-11-18 ENCOUNTER — Ambulatory Visit (INDEPENDENT_AMBULATORY_CARE_PROVIDER_SITE_OTHER): Payer: Medicare Other | Admitting: Pulmonary Disease

## 2022-11-18 VITALS — BP 126/74 | HR 85 | Temp 98.3°F | Ht 66.5 in | Wt 182.4 lb

## 2022-11-18 DIAGNOSIS — R0982 Postnasal drip: Secondary | ICD-10-CM

## 2022-11-18 DIAGNOSIS — J4520 Mild intermittent asthma, uncomplicated: Secondary | ICD-10-CM | POA: Diagnosis not present

## 2022-11-18 LAB — PULMONARY FUNCTION TEST
DL/VA % pred: 132 %
DL/VA: 5.36 ml/min/mmHg/L
DLCO cor % pred: 93 %
DLCO cor: 19.67 ml/min/mmHg
DLCO unc % pred: 85 %
DLCO unc: 18.04 ml/min/mmHg
FEF 25-75 Post: 2.83 L/sec
FEF 25-75 Pre: 2.31 L/sec
FEF2575-%Change-Post: 22 %
FEF2575-%Pred-Post: 144 %
FEF2575-%Pred-Pre: 118 %
FEV1-%Change-Post: 9 %
FEV1-%Pred-Post: 89 %
FEV1-%Pred-Pre: 81 %
FEV1-Post: 2.19 L
FEV1-Pre: 2 L
FEV1FVC-%Change-Post: 6 %
FEV1FVC-%Pred-Pre: 106 %
FEV6-%Change-Post: 5 %
FEV6-%Pred-Post: 82 %
FEV6-%Pred-Pre: 77 %
FEV6-Post: 2.55 L
FEV6-Pre: 2.41 L
FEV6FVC-%Change-Post: 0 %
FEV6FVC-%Pred-Post: 104 %
FEV6FVC-%Pred-Pre: 104 %
FVC-%Change-Post: 2 %
FVC-%Pred-Post: 79 %
FVC-%Pred-Pre: 76 %
FVC-Post: 2.56 L
FVC-Pre: 2.49 L
Post FEV1/FVC ratio: 86 %
Post FEV6/FVC ratio: 100 %
Pre FEV1/FVC ratio: 80 %
Pre FEV6/FVC Ratio: 100 %
RV % pred: 113 %
RV: 2.66 L
TLC % pred: 100 %
TLC: 5.48 L

## 2022-11-18 NOTE — Progress Notes (Unsigned)
Synopsis: Referred in February 2024 for shortness of breath by Lorenda Peck, NP  Subjective:   PATIENT ID: Kimberly Cap GENDER: female DOB: Aug 01, 1951, MRN: [id number]   HPI  Chief Complaint  Patient presents with   Follow-up    SOB/ cough improved, PFT review    Kimberly Ray is a 72 year old woman, never smoker with GERD and OSA on CPAP who is referred to pulmonary clinic for shortness of breath.   She has had significant improvement in her shortness of breath and cough with using advair scheduled. She has post nasal drainage. She has not been using flonase. She is taking zyrtec as needed.   She has issues with reflux and belching which is a bit better on pantoprazole.   PFTs are within normal limits.   Initial OV 09/26/22 She reports shortness of breath over the past 2 years. She initially noted it when her cousin thought she was more short of breath than normal. She has others state she sounds like she is wheezing but she does not hear herself. The dyspnea may be worse during colder weather. She has dry cough. The cough may wake her up occasionally at night. She does have sinus congestion and post-nasal drainage.   She was recently treated for sinus infection with antibiotics and prednisone. She reports it did not help her dyspnea much.   She recently tried trelegy inhaler and albuterol for her shorntess of breath which she reports did give her some improvement.   She denies history of  recurrent pneumonia or bronchitis. She takes zyrtec and flonase as needed.  She is a retired Catering manager. Never smoker and no second hand smoke exposure. She reports possible asbestos exposure from a prior apartment where she lived for 18 years. 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  Ddimer was elevated 1.66 on 1/23.  Based on age adjusted calculator, VTE is unlikely  Past Medical History:  Diagnosis Date   Adnexal mass 04/08/2020   Anemia    Depression     Extrinsic asthma 11/09/2014   Gait abnormality 12/26/2019   GERD (gastroesophageal reflux disease)    Hay fever    Head trauma    IBS (irritable bowel syndrome)    Memory loss    Migraines    OSA on CPAP    Reflux    RLS (restless legs syndrome)    Sinus congestion      Family History  Problem Relation Age of Onset   Diabetes Mother    Heart failure Father    Dementia Father    Mood Disorder Father    Anxiety disorder Father    Diabetes Sister    Anxiety disorder Sister    Depression Sister    Breast cancer Maternal Grandmother 25   Breast cancer Maternal Aunt 45   Breast cancer Cousin    Osteoporosis Brother    Bipolar disorder Brother    Anxiety disorder Brother    Heart failure Maternal Grandfather 59   Colon cancer Paternal Grandmother 30   Mental illness Paternal Grandmother    Stomach cancer Maternal Aunt      Social History   Socioeconomic History   Marital status: Divorced    Spouse name: Not on file   Number of children: 2   Years of education: 13   Highest education level: Not on file  Occupational History    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 Patient is flight attendant. Patient has college education one year      Right handed.   Caffeine- Rare.    Social Determinants of Health   Financial Resource Strain: Not on file  Food Insecurity: Not on file  Transportation Needs: Not on file  Physical Activity: Inactive (04/06/2018)   Exercise Vital Sign    Days of Exercise per Week: 0 days    Minutes of Exercise per Session: 0 min   Stress: No Stress Concern Present (04/06/2018)   [place]    Feeling of Stress : Only a little  Social Connections: Not on file  Intimate Partner Violence: Not on file     Allergies  Allergen Reactions   Erythromycin Other (See Comments)    TBS   Iodinated Contrast Media Other (See Comments)    Hot flashes, itching scalp Hot flashes, itching scalp   Penicillins Hives   Clarithromycin Other (See Comments)    Itching ankles   Brintellix [Vortioxetine]     Itching   Cholestatin      Outpatient Medications Prior to Visit  Medication Sig Dispense Refill   acetaminophen (TYLENOL) 650 MG CR tablet Take 650 mg by mouth every 8 (eight) hours as needed for pain.     albuterol (VENTOLIN HFA) 108 (90 Base) MCG/ACT inhaler Inhale 2 puffs into the lungs every 6 (six) hours as needed for wheezing or shortness of breath. 8 Ray 2   ASPIRIN 81 PO Aspirin     cholecalciferol (VITAMIN D3) 25 MCG (1000 UNIT) tablet Take 4,000 Units by mouth daily.     Cyanocobalamin 2500 MCG SUBL Place 1 tablet under the tongue daily.     Evolocumab (REPATHA) 140 MG/ML SOSY Inject 1 mL into the skin every 14 (fourteen) days. 2.1 mL 6   fluticasone (FLONASE) 50 MCG/ACT nasal spray 2 prays each nostril once a day 16 Ray 2   fluticasone-salmeterol (ADVAIR DISKUS) 250-50 MCG/ACT AEPB Inhale 1 puff into the lungs in the morning and at bedtime. 60 each 2   lamoTRIgine (LAMICTAL) 150 MG tablet Take  1 tablet  Daily  for  Mood                          /                                                      Take                               by                          mouth 90 tablet 3   methylphenidate 18 MG PO CR tablet  Take 18 mg by mouth daily.     mirabegron ER (MYRBETRIQ) 25 MG TB24 tablet Take 1 tablet (25 mg total) by mouth daily. 90 tablet 2   ondansetron (ZOFRAN-ODT) 8 MG disintegrating tablet DISSOLVE 1 TABLET ON THE  TONGUE EVERY 8 HOURS AS  NEEDED FOR  NAUSEA AND  VOMITING 60 tablet 0   Oxymetazoline HCl (NASAL SPRAY NA) Place into the nose.     pantoprazole (PROTONIX) 40 MG tablet Take  1 tablet 2 x / day  to Prevent Heartburn &  Indigestion 180 tablet 3   sodium chloride (OCEAN) 0.65 % nasal spray Place into the nose.     SUCRALFATE PO Take 1 Ray by mouth as needed.     SUMAtriptan (IMITREX) 25 MG tablet TAKE 1 TO 2 TABLETS BY MOUTH WITH ONSET OF MIGRAINE. MAY REPEAT IN 2 HOURS IF HEADACHE PERSISTS. MAX OF 4 TABLETS IN 24 HOURS 30 tablet 0   No facility-administered medications prior to visit.    Review of Systems  Constitutional:  Negative for chills, fever, malaise/fatigue and weight loss.  HENT:  Positive for congestion. Negative for sinus pain and sore throat.   Eyes: Negative.   Respiratory:  Positive for cough, shortness of breath and wheezing. Negative for hemoptysis and sputum production.   Cardiovascular:  Negative for chest pain, palpitations, orthopnea, claudication and leg swelling.  Gastrointestinal:  Negative for abdominal pain, heartburn, nausea and vomiting.  Genitourinary: Negative.   Musculoskeletal:  Negative for joint pain and myalgias.  Skin:  Negative for rash.   Objective:   Vitals:   11/18/22 1458  BP: 126/74  Pulse: 85  Temp: 98.3 F (36.8 C)  TempSrc: Oral  SpO2: 96%  Weight: 182 lb 6.4 oz (82.7 kg)  Height: 5' 6.5" (1.689 m)   Physical Exam Constitutional:      General: She is not in acute distress.    Appearance: She is not ill-appearing.  HENT:     Head: Normocephalic and atraumatic.  Eyes:     General: No scleral icterus.    Conjunctiva/sclera: Conjunctivae normal.  Cardiovascular:     Rate and Rhythm: Normal rate and regular rhythm.     Pulses: Normal pulses.     Heart sounds: Normal heart sounds. No murmur heard. Pulmonary:     Effort: Pulmonary effort is normal.     Breath sounds: Normal breath sounds. No wheezing, rhonchi or rales.  Musculoskeletal:     Right lower leg: No edema.      Left lower leg: No edema.  Skin:    General: Skin is warm and dry.  Neurological:     General: No focal deficit present.     Mental Status: She is alert.     CBC    Component Value Date/Time   WBC 5.8 10/20/2022 1502   RBC 4.25 10/20/2022 1502   HGB 11.0 (L) 10/20/2022 1502   HGB 11.6 07/17/2022 1211   HCT 34.7 (L) 10/20/2022 1502   HCT 37.6 07/17/2022 1211   PLT 332 10/20/2022 1502   PLT 377 07/17/2022 1211   MCV 81.6 10/20/2022 1502   MCV 86 07/17/2022 1211   MCH 25.9 (L) 10/20/2022 1502   MCHC 31.7 (L) 10/20/2022 1502   RDW 14.1 10/20/2022 1502   RDW 13.4 07/17/2022 1211   LYMPHSABS 1,357 10/20/2022 1502   MONOABS 576 01/20/2017 1006   EOSABS 93 10/20/2022 1502   BASOSABS 52 10/20/2022 1502      Latest Ref Rng & Units  10/20/2022    3:02 PM 07/24/2022   12:00 AM 07/17/2022   12:11 PM  BMP  Glucose 65 - 99 mg/dL 132  148  95   BUN 7 - 25 mg/dL 17  19  25    Creatinine 0.60 - 1.00 mg/dL 1.22  1.03  0.94   BUN/Creat Ratio 6 - 22 (calc) 14  18  27    Sodium 135 - 146 mmol/L 142  137  141   Potassium 3.5 - 5.3 mmol/L 5.0  4.5  4.6   Chloride 98 - 110 mmol/L 106  99  103   CO2 20 - 32 mmol/L 23  24  23    Calcium 8.6 - 10.4 mg/dL 9.9  9.6  9.7    Chest imaging: CXR 07/17/22 The lungs are clear and negative for focal airspace consolidation, pulmonary edema or suspicious pulmonary nodule. No pleural effusion or pneumothorax. Cardiac and mediastinal contours are within normal limits. No acute fracture or lytic or blastic osseous lesions. The visualized upper abdominal bowel gas pattern is unremarkable.  PFT:    Latest Ref Rng & Units 11/18/2022    1:46 PM  PFT Results  FVC-Pre L 2.49  P  FVC-Predicted Pre % 76  P  FVC-Post L 2.56  P  FVC-Predicted Post % 79  P  Pre FEV1/FVC % % 80  P  Post FEV1/FCV % % 86  P  FEV1-Pre L 2.00  P  FEV1-Predicted Pre % 81  P  FEV1-Post L 2.19  P  DLCO uncorrected ml/min/mmHg 18.04  P  DLCO UNC% % 85  P  DLCO corrected  ml/min/mmHg 19.67  P  DLCO COR %Predicted % 93  P  DLVA Predicted % 132  P  TLC L 5.48  P  TLC % Predicted % 100  P  RV % Predicted % 113  P    P Preliminary result    Labs:  Path:  Echo 07/2022: LV EF 55-60%. Grade I diastolic dysfunction. RV size and function is normal  Heart Catheterization:  Assessment & Plan:   Mild intermittent reactive airway disease without complication  Post-nasal drip  Discussion: Carole Krill is a 72 year old woman, never smoker with GERD and OSA on CPAP who returns to pulmonary clinic for shortness of breath.   She likely has degree of reactive airways disease and post nasal drainage.   Her PFTs are within normal limits.   She is to continue advair diskus 1 puff twice daily and resume flonase nasal spray.  She reports having episodes of heart palpitations. She has not had extended cardiac monitoring done in the past. I will reach out to her cardiology team.   Follow up in 6 months.   Freda Jackson, MD [facility] Office: [phone]    Current Outpatient Medications:    acetaminophen (TYLENOL) 650 MG CR tablet, Take 650 mg by mouth every 8 (eight) hours as needed for pain., Disp: , Rfl:    albuterol (VENTOLIN HFA) 108 (90 Base) MCG/ACT inhaler, Inhale 2 puffs into the lungs every 6 (six) hours as needed for wheezing or shortness of breath., Disp: 8 Ray, Rfl: 2   ASPIRIN 81 PO, Aspirin, Disp: , Rfl:    cholecalciferol (VITAMIN D3) 25 MCG (1000 UNIT) tablet, Take 4,000 Units by mouth daily., Disp: , Rfl:    Cyanocobalamin 2500 MCG SUBL, Place 1 tablet under the tongue daily., Disp: , Rfl:    Evolocumab (REPATHA) 140 MG/ML SOSY, Inject 1 mL  into the skin every 14 (fourteen) days., Disp: 2.1 mL, Rfl: 6   fluticasone (FLONASE) 50 MCG/ACT nasal spray, 2 prays each nostril once a day, Disp: 16 Ray, Rfl: 2   fluticasone-salmeterol (ADVAIR DISKUS) 250-50 MCG/ACT AEPB, Inhale 1 puff into the lungs in the morning and at  bedtime., Disp: 60 each, Rfl: 2   lamoTRIgine (LAMICTAL) 150 MG tablet, Take  1 tablet  Daily  for  Mood                          /                                                      Take                               by                          mouth, Disp: 90 tablet, Rfl: 3   methylphenidate 18 MG PO CR tablet, Take 18 mg by mouth daily., Disp: , Rfl:    mirabegron ER (MYRBETRIQ) 25 MG TB24 tablet, Take 1 tablet (25 mg total) by mouth daily., Disp: 90 tablet, Rfl: 2   ondansetron (ZOFRAN-ODT) 8 MG disintegrating tablet, DISSOLVE 1 TABLET ON THE  TONGUE EVERY 8 HOURS AS  NEEDED FOR NAUSEA AND  VOMITING, Disp: 60 tablet, Rfl: 0   Oxymetazoline HCl (NASAL SPRAY NA), Place into the nose., Disp: , Rfl:    pantoprazole (PROTONIX) 40 MG tablet, Take  1 tablet 2 x / day  to Prevent Heartburn &  Indigestion, Disp: 180 tablet, Rfl: 3   sodium chloride (OCEAN) 0.65 % nasal spray, Place into the nose., Disp: , Rfl:    SUCRALFATE PO, Take 1 Ray by mouth as needed., Disp: , Rfl:    SUMAtriptan (IMITREX) 25 MG tablet, TAKE 1 TO 2 TABLETS BY MOUTH WITH ONSET OF MIGRAINE. MAY REPEAT IN 2 HOURS IF HEADACHE PERSISTS. MAX OF 4 TABLETS IN 24 HOURS, Disp: 30 tablet, Rfl: 0

## 2022-11-18 NOTE — Patient Instructions (Signed)
Full PFT performed today. °

## 2022-11-18 NOTE — Patient Instructions (Addendum)
Continue advair inhaler 1 puff twice daily  Use albuterol inhaler 1-2 puffs every 4-6 hours as needed  Resume flonase 1 spray per nostril daily for post-nasal drainage  Recommend using zyrtec daily for the sinus drainage  Continue to use saline nasal spray as needed   Your breathing tests are normal  Follow up in 6 months

## 2022-11-18 NOTE — Progress Notes (Signed)
Full PFT performed today. °

## 2022-11-19 ENCOUNTER — Encounter: Payer: Self-pay | Admitting: Pulmonary Disease

## 2022-11-21 DIAGNOSIS — H11823 Conjunctivochalasis, bilateral: Secondary | ICD-10-CM | POA: Diagnosis not present

## 2022-11-24 DIAGNOSIS — H524 Presbyopia: Secondary | ICD-10-CM | POA: Diagnosis not present

## 2022-11-24 DIAGNOSIS — H53143 Visual discomfort, bilateral: Secondary | ICD-10-CM | POA: Diagnosis not present

## 2022-11-25 ENCOUNTER — Encounter (INDEPENDENT_AMBULATORY_CARE_PROVIDER_SITE_OTHER): Payer: Medicare Other | Admitting: Neurology

## 2022-11-25 DIAGNOSIS — R413 Other amnesia: Secondary | ICD-10-CM

## 2022-11-26 ENCOUNTER — Telehealth: Payer: Self-pay | Admitting: Neurology

## 2022-11-26 NOTE — Telephone Encounter (Signed)
Neuropsychology referral sent to [facility] (fax # [phone], phone # [phone]) 

## 2022-11-27 DIAGNOSIS — R350 Frequency of micturition: Secondary | ICD-10-CM | POA: Diagnosis not present

## 2022-12-16 ENCOUNTER — Ambulatory Visit: Payer: Medicare Other | Admitting: Nurse Practitioner

## 2022-12-24 NOTE — Telephone Encounter (Signed)
Neuropsych referral refaxed to [facility] (fax# [phone], phone# [phone])

## 2022-12-30 ENCOUNTER — Ambulatory Visit: Payer: Medicare Other | Admitting: Neurology

## 2022-12-31 ENCOUNTER — Telehealth: Payer: Self-pay | Admitting: Neurology

## 2022-12-31 NOTE — Telephone Encounter (Signed)
Referral sent to Dr. Minus Liberty office: Phone:[phone]  Fax: [phone]

## 2023-01-07 NOTE — Progress Notes (Signed)
Future Appointments  Date Time Provider Department  01/08/2023  4:00 PM Lucky Cowboy, MD [facility]  01/30/2023 10:30 AM Lucky Cowboy, MD [facility]  05/06/2023                     cpe 10:00 AM Adela Glimpse, NP [facility]  09/01/2023  1:45 PM Glean Salvo, NP [facility]  11/03/2023                    wellness 11:00 AM Adela Glimpse, NP [facility]    History of Present Illness:       This very nice 72 y.o.  DWF with HTN, HLD, Pre-Diabetes,  OSA/CPAP,  GERD and Vitamin D Deficiency who presents with concerns  of occasional feeling her memory or mental clarity is "dulled"    Current Outpatient Medications  Medication Instructions   acetaminophen  650 mg Every 8 hours PRN   Albuterol  HFA   inhaler 2  Inhalation Every 6 hours PRN   ASPIRIN 81  Aspirin   VITAMIN D 4,000 Units Daily   Vit B12  2500 mcg  SL 1 tablet  Daily   REPATHA  140 MG/ML S 1 mL, Subcut Every 14 days   FLONASE nasal spray 2 prays each nostril once a day   ADVAIR DISKUS 250-50  1 Inhalation   2 times daily   lamoTRIgine (LAMICTAL) 150 MG tablet Take  1 tablet  Daily   methylphenidate (CONCERTA)  18 mg Daily   mirabegron ER  25 mgDaily   ondansetron -ODT 8 MG   1 TAB SL EVERY 8 HRS AS  NEEDED    Oxymetazoline NASAL SPRAY  Nasal   pantoprazole (0 MG tablet Take  1 tablet 2 x / day    sodium chloride  0.65 % nasal spray Nasal   SUCRALFATE 1 gm As needed   SUMAtriptan (IMITREX) 25 MG tablet TAKE 1 TO 2 TABLETS  WITH ONSET OF MIGRAINE. MAY REPEAT IN 2 HOURS IF HEADACHE PERSISTS.      Allergies  Allergen Reactions   Erythromycin Other (See Comments)    TBS   Iodinated Contrast Media Other (See Comments)    Hot flashes, itching scalp Hot flashes, itching scalp   Penicillins Hives   Clarithromycin Other (See Comments)    Itching ankles   Brintellix [Vortioxetine]     Itching   Cholestatin      Problem list She has Mixed hyperlipidemia; ADD (attention deficit disorder); OSA on CPAP; Major  depression, recurrent, chronic (HCC); Vitamin D deficiency; GERD ; Obesity (BMI 30.0-34.9); Circadian rhythm sleep disorder; Periodic limb movement disorder (PLMD); Fatty liver; Osteopenia; Memory changes; Gait abnormality; RLS (restless legs syndrome); B12 deficiency; Seasonal allergies; Nonrheumatic mitral valve regurgitation; Prediabetes; Hypertriglyceridemia; Stage 3a chronic kidney disease (HCC); SOB (shortness of breath); Chest pain; Medication management; and DOE (dyspnea on exertion) on their problem list.     Observations/Objective:  BP 122/70   Pulse 92   Temp 98.1 F (36.7 C)   Resp 16   Ht 5' 6.5" (1.689 m)   Wt 179 lb 6.4 oz (81.4 kg)   SpO2 96%   BMI 28.52 kg/m   HEENT - WNL. Neck - supple.  Chest - Clear equal BS. Cor - Nl HS. RRR w/o sig MGR. PP 1(+). No edema. MS- FROM w/o deformities.  Gait Nl. Neuro -  Nl w/o focal abnormalities.    Assessment and Plan:   1. Memory changes  -  Lamotrigine level  - Recommend taper her Prozac 40 mg to qod   2. Major depression, recurrent, chronic (HCC)  - TSH - Lamotrigine level  - Cyanocobalamin 2500 MCG SUBL;  Place 1 tablet (2,500 mcg total) under the tongue daily.   3. Attention deficit hyperactivity disorder (ADHD), predominantly inattentive type  - Lamotrigine level   4. Thyroid function test abnormal  - TSH   5. Medication management  - TSH - Lamotrigine level  - Cyanocobalamin 2500 MCG SUBL; Place 1 tablet (2,500 mcg total) under the tongue daily.   6. OAB (overactive bladder)  - Vibegron (GEMTESA) 75 MG TABS;  Takes  1 tablet  Daily  for Bladder control   Dispense: 30 tablet  ( Has sx's from her urologist)    Follow Up Instructions:        I discussed the assessment and treatment plan with the patient. The patient was provided an opportunity to ask questions and all were answered. The patient agreed with the plan and demonstrated an understanding of the instructions.       The patient  was advised to call back or seek an in-person evaluation if the symptoms worsen or if the condition fails to improve as anticipated.    Marinus Maw, MD

## 2023-01-08 ENCOUNTER — Ambulatory Visit (INDEPENDENT_AMBULATORY_CARE_PROVIDER_SITE_OTHER): Payer: Medicare Other | Admitting: Internal Medicine

## 2023-01-08 ENCOUNTER — Encounter: Payer: Self-pay | Admitting: Internal Medicine

## 2023-01-08 VITALS — BP 122/70 | HR 92 | Temp 98.1°F | Resp 16 | Ht 66.5 in | Wt 179.4 lb

## 2023-01-08 DIAGNOSIS — R946 Abnormal results of thyroid function studies: Secondary | ICD-10-CM | POA: Diagnosis not present

## 2023-01-08 DIAGNOSIS — F339 Major depressive disorder, recurrent, unspecified: Secondary | ICD-10-CM | POA: Diagnosis not present

## 2023-01-08 DIAGNOSIS — R413 Other amnesia: Secondary | ICD-10-CM | POA: Diagnosis not present

## 2023-01-08 DIAGNOSIS — Z79899 Other long term (current) drug therapy: Secondary | ICD-10-CM

## 2023-01-08 DIAGNOSIS — N3281 Overactive bladder: Secondary | ICD-10-CM | POA: Diagnosis not present

## 2023-01-08 DIAGNOSIS — F900 Attention-deficit hyperactivity disorder, predominantly inattentive type: Secondary | ICD-10-CM

## 2023-01-08 MED ORDER — CYANOCOBALAMIN 2500 MCG SL SUBL: 1.0000 | SUBLINGUAL_TABLET | Freq: Every day | SUBLINGUAL | Status: DC

## 2023-01-08 MED ORDER — GEMTESA 75 MG PO TABS: ORAL_TABLET | ORAL | Status: DC

## 2023-01-08 MED ORDER — METHYLPHENIDATE HCL ER (OSM) 18 MG PO TBCR: EXTENDED_RELEASE_TABLET | ORAL | Status: DC

## 2023-01-08 MED ORDER — BUPROPION HCL ER (XL) 300 MG PO TB24: ORAL_TABLET | ORAL | 3 refills | Status: DC

## 2023-01-08 MED ORDER — FLUOXETINE HCL 40 MG PO CAPS: ORAL_CAPSULE | ORAL | 1 refills | Status: DC

## 2023-01-08 NOTE — Patient Instructions (Signed)
Memory Compensation Strategies  Use "WARM" strategy.  W= write it down  A= associate it  R= repeat it  M= make a mental note  2.   You can keep a Memory Notebook.  Use a 3-ring notebook with sections for the following: calendar, important names and phone numbers,  medications, doctors' names/phone numbers, lists/reminders, and a section to journal what you did  each day.   3.    Use a calendar to write appointments down.  4.    Write yourself a schedule for the day.  This can be placed on the calendar or in a separate section of the Memory Notebook.  Keeping a  regular schedule can help memory.  5.    Use medication organizer with sections for each day or morning/evening pills.  You may need help loading it  6.    Keep a basket, or pegboard by the door.  Place items that you need to take out with you in the basket or on the pegboard.  You may also want to  include a message board for reminders.  7.    Use sticky notes.  Place sticky notes with reminders in a place where the task is performed.  For example: " turn off the  stove" placed by the stove, "lock the door" placed on the door at eye level, " take your medications" on  the bathroom mirror or by the place where you normally take your medications.  8.    Use alarms/timers.  Use while cooking to remind yourself to check on food or as a reminder to take your medicine, or as a  reminder to make a call, or as a reminder to perform another task, etc. Management of Memory Problems  There are some general things you can do to help manage your memory problems.  Your memory may not in fact recover, but by using techniques and strategies you will be able to manage your memory difficulties better.  1)  Establish a routine. Try to establish and then stick to a regular routine.  By doing this, you will get used to what to expect and you will reduce the need to rely on your memory.  Also, try to do things at the same time of day, such as  taking your medication or checking your calendar first thing in the morning. Think about think that you can do as a part of a regular routine and make a list.  Then enter them into a daily planner to remind you.  This will help you establish a routine.  2)  Organize your environment. Organize your environment so that it is uncluttered.  Decrease visual stimulation.  Place everyday items such as keys or cell phone in the same place every day (ie.  Basket next to front door) Use post it notes with a brief message to yourself (ie. Turn off light, lock the door) Use labels to indicate where things go (ie. Which cupboards are for food, dishes, etc.) Keep a notepad and pen by the telephone to take messages  3)  Memory Aids A diary or journal/notebook/daily planner Making a list (shopping list, chore list, to do list that needs to be done) Using an alarm as a reminder (kitchen timer or cell phone alarm) Using cell phone to store information (Notes, Calendar, Reminders) Calendar/White board placed in a prominent position Post-it notes  In order for memory aids to be useful, you need to have good habits.  It's no good remembering   to make a note in your journal if you don't remember to look in it.  Try setting aside a certain time of day to look in journal.  4)  Improving mood and managing fatigue. There may be other factors that contribute to memory difficulties.  Factors, such as anxiety, depression and tiredness can affect memory. Regular gentle exercise can help improve your mood and give you more energy. Simple relaxation techniques may help relieve symptoms of anxiety Try to get back to completing activities or hobbies you enjoyed doing in the past. Learn to pace yourself through activities to decrease fatigue. Find out about some local support groups where you can share experiences with others. Try and achieve 7-8 hours of sleep at night. 

## 2023-01-10 LAB — TSH: TSH: 2.08 mIU/L (ref 0.40–4.50)

## 2023-01-10 LAB — LAMOTRIGINE LEVEL: Lamotrigine Lvl: 5.8 ug/mL (ref 2.5–15.0)

## 2023-01-10 NOTE — Progress Notes (Signed)
^<^<^<^<^<^<^<^<^<^<^<^<^<^<^<^<^<^<^<^<^<^<^<^<^<^<^<^<^<^<^<^<^<^<^<^<^ ^>^>^>^>^>^>^>^>^>^>^>>^>^>^>^>^>^>^>^>^>^>^>^>^>^>^>^>^>^>^>^>^>^>^>^>^>  -   Thyroid & Lamictal   levels both in good ranges   ^<^<^<^<^<^<^<^<^<^<^<^<^<^<^<^<^<^<^<^<^<^<^<^<^<^<^<^<^<^<^<^<^<^<^<^<^ ^>^>^>^>^>^>^>^>^>^>^>^>^>^>^>^>^>^>^>^>^>^>^>^>^>^>^>^>^>^>^>^>^>^>^>^>^

## 2023-01-29 ENCOUNTER — Encounter (INDEPENDENT_AMBULATORY_CARE_PROVIDER_SITE_OTHER): Payer: Self-pay

## 2023-01-29 ENCOUNTER — Encounter: Payer: Self-pay | Admitting: Internal Medicine

## 2023-01-29 NOTE — Patient Instructions (Signed)

## 2023-01-29 NOTE — Progress Notes (Signed)
Patient has a Headache &   cancelled her App't                                        Future Appointments  Date Time Provider Department  01/30/2023 10:30 AM Lucky Cowboy, MD [facility]  05/06/2023                        cpe 10:00 AM Adela Glimpse, NP [facility]  09/01/2023  1:45 PM Glean Salvo, NP [facility]  11/03/2023                        wellness 11:00 AM Adela Glimpse, NP [facility]  05/06/2024                        cpe  10:00 AM Adela Glimpse, NP [facility]    History of Present Illness:       This very nice 72 y.o. DWF presents for 6 month follow up with HTN, HLD, Pre-Diabetes and Vitamin D Deficiency.   Patient has OSA on CPAP with improved restorative sleep .        Patient is treated for HTN  & BP has been controlled at home. Today's  . Patient has had no complaints of any cardiac type chest pain, palpitations, dyspnea / orthopnea / PND, dizziness, claudication, or dependent edema.        Hyperlipidemia is controlled with diet &  Repatha, Ezetimibe Hezzie Bump . Patient denies  myalgias or other med SE's. Last Lipids were not at goal :  Lab Results  Component Value Date   CHOL 207 (H) 07/24/2022   HDL 60 07/24/2022   LDLCALC 115 (H) 07/24/2022   TRIG 205 (H) 07/24/2022   CHOLHDL 3.5 07/24/2022     Also, the patient has history of PreDiabetes (6.3% /2011)   and then T2+_NIDDM with A1c 6.6% in 2021  and CKD 3a  (GFR 48).. Patient  has had no symptoms of reactive hypoglycemia, diabetic polys, paresthesias or visual blurring.  Last A1c was  not at goal :  Lab Results  Component Value Date   HGBA1C 6.2 (H) 07/24/2022                                                          Further, the patient also has history of Vitamin D Deficiency  ("33" /2012)  and supplements vitamin D . Last vitamin D was near goal (70-100) :  Lab Results  Component Value Date   VD25OH 58 07/24/2022     Current Outpatient Medications on File Prior to Visit  Medication Sig   acetaminophen (TYLENOL) 650 MG CR tablet Take 650 mg by mouth every 8 (eight) hours as needed for pain.   albuterol (VENTOLIN HFA) 108 (90 Base) MCG/ACT inhaler Inhale 2 puffs into the lungs every 6 (six) hours as needed for wheezing or shortness of breath.   ASPIRIN 81 PO Aspirin   buPROPion (WELLBUTRIN XL) 300 MG 24 hr tablet Take 1 tablet Daily for Mood, Focus & Concentration.   cholecalciferol (VITAMIN D3) 25 MCG (1000 UNIT) tablet Take 4,000 Units by mouth daily.   Cyanocobalamin 2500 MCG SUBL Place 1 tablet (2,500 mcg total) under the tongue daily.   Evolocumab (REPATHA) 140 MG/ML SOSY Inject 1 mL into the skin every 14 (fourteen) days.   FLUoxetine (PROZAC) 40 MG capsule Takes 1 capsule Daily for Mood   fluticasone (FLONASE) 50 MCG/ACT nasal spray 2 prays each nostril once a day   ADVAIR DISKUS  250-50 Inhale 1 puff  in the morning and at bedtime.   lamoTRIgine (LAMICTAL) 150 MG tablet Take  1 tablet  Daily     methylphenidate 18 MG PO CR tablet Takes 2 tablets ( 36 mg) every morning   ondansetron -ODT 8  MG disintegrating tablet DISSOLVE 1 TABLET ON THE  TONGUE EVERY 8 HOURS AS  NEEDED    Oxymetazoline HCl (NASAL SPRAY NA) Place into the nose.   pantoprazole 40 MG tablet Take  1 tablet 2 x / day  to Prevent Heartburn &  Indigestion   sodium chloride (OCEAN)  0.65 % nasal spray Place into the nose.   SUCRALFATE Take 1 g as needed.   SUMAtriptan (IMITREX) 25 MG tablet TAKE 1 TO 2 TABLETS  WITH ONSET OF MIGRAINE. MAY REPEAT IN 2 HOURS IF HEADACHE PERSISTS. MAX OF 4 TABLETS IN 24 HOURS   Vibegron (GEMTESA) 75 MG TABS Takes  1 tablet  Daily  for Bladder control     Allergies  Allergen Reactions   Erythromycin Other (See Comments)    TBS   Iodinated Contrast Media Other (See Comments)    Hot flashes, itching scalp Hot flashes, itching scalp   Penicillins Hives   Clarithromycin Other (See Comments)    Itching ankles   Brintellix [Vortioxetine]     Itching   Cholestatin      PMHx:   Past Medical History:  Diagnosis Date   Adnexal mass 04/08/2020   Anemia    Depression    Extrinsic asthma 11/09/2014   Gait abnormality 12/26/2019   GERD (gastroesophageal reflux disease)    Hay fever    Head trauma    IBS (irritable bowel syndrome)    Memory loss    Migraines    OSA on CPAP    Reflux    RLS (restless legs syndrome)    Sinus congestion      Immunization History  Administered Date(s) Administered   COVID-19, mRNA, vacc 05/26/2022   Influenza, High Dose  06/12/2016, 05/13/2017, 06/25/2020   Influenza 06/02/2015, 05/19/2018, 05/19/2019, 05/22/2022   PFIZER SARS-COV-2 Vacc 09/12/2019, 10/03/2019, 04/30/2020, 12/08/2020   PPD Test 08/19/2011, 08/30/2013, 11/09/2014   Pfizer Covid-19 Vacc Bivalent 05/08/2021   Pneumococcal -13 08/18/2009, 02/18/2019   Pneumococcal -23 06/12/2016   Td 08/19/2003   Tdap 08/30/2013   Zoster Recombinat (Shingrix) 10/07/2021, 12/09/2021   Zoster, Live 08/18/2010     Past Surgical History:  Procedure Laterality Date   ABDOMINAL HYSTERECTOMY      BREAST BIOPSY Right 02/27/2014   Stereo- Benign   CHOLECYSTECTOMY  11/03/2017   Dr. Derrell Lolling   LAPAROSCOPIC HYSTERECTOMY  12/2017   NASAL SINUS SURGERY     ORIF ANKLE FRACTURE BIMALLEOLAR Left 03/14/2019   [facility], Dr. Katrina Stack   perforated eardrum     left side   TONSILLECTOMY AND ADENOIDECTOMY     URETEROSCOPY     for ureteral stone     FHx:    Reviewed / unchanged   SHx:    Reviewed / unchanged    Systems Review:  Constitutional: Denies fever, chills, wt changes, headaches, insomnia, fatigue, night sweats, change in appetite. Eyes: Denies redness, blurred vision, diplopia, discharge, itchy, watery eyes.  ENT: Denies discharge, congestion, post nasal drip, epistaxis, sore throat, earache, hearing loss, dental pain, tinnitus, vertigo, sinus pain, snoring.  CV: Denies chest pain, palpitations, irregular heartbeat, syncope, dyspnea, diaphoresis, orthopnea, PND, claudication or edema. Respiratory: denies cough, dyspnea, DOE, pleurisy, hoarseness, laryngitis, wheezing.  Gastrointestinal: Denies dysphagia, odynophagia, heartburn, reflux, water brash, abdominal pain or cramps, nausea, vomiting, bloating, diarrhea, constipation, hematemesis, melena, hematochezia  or hemorrhoids. Genitourinary: Denies dysuria, frequency, urgency, nocturia, hesitancy, discharge, hematuria or flank pain. Musculoskeletal: Denies arthralgias, myalgias, stiffness, jt. swelling, pain, limping or strain/sprain.  Skin: Denies pruritus, rash, hives, warts, acne, eczema or change in skin lesion(s). Neuro: No weakness, tremor, incoordination, spasms, paresthesia or pain. Psychiatric: Denies confusion, memory loss or sensory loss. Endo: Denies change in weight, skin or hair change.  Heme/Lymph: No excessive bleeding, bruising or enlarged lymph nodes.   Physical Exam  There were no vitals taken  for this visit.  Appears  well nourished, well groomed  and in no distress.  Eyes: PERRLA, EOMs, conjunctiva no  swelling or erythema. Sinuses: No frontal/maxillary tenderness ENT/Mouth: EAC's clear, TM's nl w/o erythema, bulging. Nares clear w/o erythema, swelling, exudates. Oropharynx clear without erythema or exudates. Oral hygiene is good. Tongue normal, non obstructing. Hearing intact.  Neck: Supple. Thyroid not palpable. Car 2+/2+ without bruits, nodes or JVD. Chest: Respirations nl with BS clear & equal w/o rales, rhonchi, wheezing or stridor.  Cor: Heart sounds normal w/ regular rate and rhythm without sig. murmurs, gallops, clicks or rubs. Peripheral pulses normal and equal  without edema.  Abdomen: Soft & bowel sounds normal. Non-tender w/o guarding, rebound, hernias, masses or organomegaly.  Lymphatics: Unremarkable.  Musculoskeletal: Full ROM all peripheral extremities, joint stability, 5/5 strength and normal gait.  Skin: Warm, dry without exposed rashes, lesions or ecchymosis apparent.  Neuro: Cranial nerves intact, reflexes equal bilaterally. Sensory-motor testing grossly intact. Tendon reflexes grossly intact.  Pysch: Alert & oriented x 3.  Insight and judgement nl & appropriate. No ideations.   Assessment and Plan:  1. Labile hypertension  - Continue medication, monitor blood pressure at home.  - Continue DASH diet.  Reminder to go to the ER if any CP,  SOB, nausea, dizziness, severe HA, changes vision/speech.    - CBC with Differential/Platelet - COMPLETE METABOLIC PANEL WITH GFR - Magnesium - TSH   2. Hyperlipidemia associated with type 2 diabetes mellitus (HCC)  - Continue diet/meds, exercise,& lifestyle modifications.  - Continue monitor periodic cholesterol/liver & renal functions     - Lipid panel - TSH   3. Type 2 diabetes mellitus with stage 3a chronic kidney  disease, without long-term current use of insulin (HCC)  - Continue diet, exercise  - Lifestyle modifications.  - Monitor appropriate labs    - Hemoglobin A1c - Insulin, random   4. Vitamin D  deficiency  - Continue supplementation.    - VITAMIN D 25 Hydroxy    5. Major depression, recurrent, chronic (HCC)  - TSH   6. OSA on CPAP   7. Medication management - CBC with Differential/Platelet - COMPLETE METABOLIC PANEL WITH GFR - Magnesium - Lipid panel - TSH - Hemoglobin A1c - Insulin, random - VITAMIN D 25 Hydroxy             Discussed  regular exercise, BP monitoring, weight control to achieve/maintain BMI less than 25 and discussed med and SE's. Recommended labs to assess /monitor clinical status .  I discussed the assessment and treatment plan with the patient. The patient was provided an opportunity to ask questions and all were answered. The patient agreed with the plan and demonstrated an understanding of the instructions.  I provided over 30 minutes of exam, counseling, chart review and  complex critical decision making.        The patient was advised to call back or seek an in-person evaluation if the symptoms worsen or if the condition fails to improve as anticipated.   Marinus Maw, MD

## 2023-01-30 ENCOUNTER — Ambulatory Visit: Payer: Medicare Other | Admitting: Internal Medicine

## 2023-01-30 DIAGNOSIS — E1122 Type 2 diabetes mellitus with diabetic chronic kidney disease: Secondary | ICD-10-CM

## 2023-01-30 DIAGNOSIS — E1169 Type 2 diabetes mellitus with other specified complication: Secondary | ICD-10-CM

## 2023-01-30 DIAGNOSIS — Z79899 Other long term (current) drug therapy: Secondary | ICD-10-CM

## 2023-01-30 DIAGNOSIS — F339 Major depressive disorder, recurrent, unspecified: Secondary | ICD-10-CM

## 2023-01-30 DIAGNOSIS — E559 Vitamin D deficiency, unspecified: Secondary | ICD-10-CM

## 2023-01-30 DIAGNOSIS — G4733 Obstructive sleep apnea (adult) (pediatric): Secondary | ICD-10-CM

## 2023-01-30 DIAGNOSIS — R0989 Other specified symptoms and signs involving the circulatory and respiratory systems: Secondary | ICD-10-CM

## 2023-02-03 ENCOUNTER — Telehealth: Payer: Self-pay

## 2023-02-03 NOTE — Telephone Encounter (Signed)
PA for Repatha completed and submitted.

## 2023-02-17 ENCOUNTER — Ambulatory Visit (INDEPENDENT_AMBULATORY_CARE_PROVIDER_SITE_OTHER): Payer: Medicare Other | Admitting: Internal Medicine

## 2023-02-17 ENCOUNTER — Encounter: Payer: Self-pay | Admitting: Internal Medicine

## 2023-02-17 VITALS — BP 122/70 | HR 90 | Temp 97.9°F | Resp 17 | Ht 66.5 in | Wt 175.4 lb

## 2023-02-17 DIAGNOSIS — Z79899 Other long term (current) drug therapy: Secondary | ICD-10-CM | POA: Diagnosis not present

## 2023-02-17 DIAGNOSIS — F339 Major depressive disorder, recurrent, unspecified: Secondary | ICD-10-CM

## 2023-02-17 DIAGNOSIS — E1122 Type 2 diabetes mellitus with diabetic chronic kidney disease: Secondary | ICD-10-CM | POA: Diagnosis not present

## 2023-02-17 DIAGNOSIS — E1169 Type 2 diabetes mellitus with other specified complication: Secondary | ICD-10-CM | POA: Diagnosis not present

## 2023-02-17 DIAGNOSIS — E785 Hyperlipidemia, unspecified: Secondary | ICD-10-CM

## 2023-02-17 DIAGNOSIS — E559 Vitamin D deficiency, unspecified: Secondary | ICD-10-CM | POA: Diagnosis not present

## 2023-02-17 DIAGNOSIS — R0989 Other specified symptoms and signs involving the circulatory and respiratory systems: Secondary | ICD-10-CM

## 2023-02-17 DIAGNOSIS — G4733 Obstructive sleep apnea (adult) (pediatric): Secondary | ICD-10-CM | POA: Diagnosis not present

## 2023-02-17 DIAGNOSIS — N1831 Chronic kidney disease, stage 3a: Secondary | ICD-10-CM | POA: Diagnosis not present

## 2023-02-17 NOTE — Progress Notes (Signed)
Future Appointments  Date Time Provider Department  02/17/2023 11:30 AM Lucky Cowboy, MD [facility]  05/06/2023                   cpe 10:00 AM Adela Glimpse, NP [facility]  09/01/2023  1:45 PM Glean Salvo, NP [facility]  11/03/2023                  wellness 11:00 AM Adela Glimpse, NP [facility]  05/06/2024                   cpe 10:00 AM Adela Glimpse, NP [facility]    History of Present Illness:       This very nice 72 y.o. DWF presents for 6 month follow up with HTN, HLD, Pre-Diabetes and Vitamin D Deficiency.   Patient has OSA on CPAP with improved restorative sleep .   Patient's GERD is controlled on her meds.  Patient also has hx/o Major Depression controlled on her meds.         Patient is followed expectantly for labile HTN  & BP has been controlled at home. Today's BP is at goal -  122/70. Patient has had no complaints of any cardiac type chest pain, palpitations, dyspnea Pollyann Kennedy /PND, dizziness, claudication or dependent edema.        Hyperlipidemia is not controlled with diet &  Repatha . Patient denies myalgias or other med SE's. Last Lipids were not at goal :  Lab Results  Component Value Date   CHOL 207 (H) 07/24/2022   HDL 60 07/24/2022   LDLCALC 115 (H) 07/24/2022   TRIG 205 (H) 07/24/2022   CHOLHDL 3.5 07/24/2022     Also, the patient has history of PreDiabetes (6.3% /2011)   and then T2+_NIDDM with A1c 6.6% in 2021  and CKD 3a  (GFR 47) and is attempting control with diet. Patient  has had no symptoms of reactive hypoglycemia, diabetic polys, paresthesias or visual blurring.  Last A1c was  not at goal :  Lab Results  Component Value Date   HGBA1C 6.2 (H) 07/24/2022                                                          Further, the patient also has history of Vitamin D Deficiency  ("33" /2012)  and supplements vitamin D .    Last vitamin D was near goal (70-100) :  Lab Results  Component Value Date   VD25OH 58 07/24/2022     Current  Outpatient Medications  Medication Instructions   acetaminophen (TYLENOL) 650 mg, Oral, Every 8 hours PRN   albuterol (VENTOLIN HFA) 108 (90 Base) MCG/ACT inhaler 2 puffs, Inhalation, Every 6 hours PRN   ASPIRIN 81 PO Aspirin   buPROPion (WELLBUTRIN XL) 300 MG 24 hr tablet Take 1 tablet Daily for Mood, Focus & Concentration.   cholecalciferol (VITAMIN D3) 4,000 Units, Oral, Daily   Cyanocobalamin 2,500 mcg, Sublingual, Daily   Evolocumab (REPATHA) 140 MG/ML SOSY 1 mL, Subcutaneous, Every 14 days   FLUoxetine (PROZAC) 40 MG capsule Takes 1 capsule Daily for Mood   fluticasone (FLONASE) 50 MCG/ACT nasal spray 2 prays each nostril once a day   fluticasone-salmeterol (ADVAIR DISKUS) 250-50 MCG/ACT AEPB 1 puff, Inhalation, 2 times daily   lamoTRIgine (  LAMICTAL) 150 MG tablet Take  1 tablet  Daily  for  Mood                          /                                                      Take                               by                          mouth   methylphenidate 18 MG PO CR tablet Takes 2 tablets ( 36 mg) every morning   ondansetron (ZOFRAN-ODT) 8 MG disintegrating tablet DISSOLVE 1 TABLET ON THE  TONGUE EVERY 8 HOURS AS  NEEDED FOR NAUSEA AND  VOMITING   Oxymetazoline HCl (NASAL SPRAY NA) Nasal   pantoprazole (PROTONIX) 40 MG tablet Take  1 tablet 2 x / day  to Prevent Heartburn &  Indigestion   sodium chloride (OCEAN) 0.65 % nasal spray Nasal   SUCRALFATE PO 1 g, Oral, As needed   SUMAtriptan (IMITREX) 25 MG tablet TAKE 1 TO 2 TABLETS BY MOUTH WITH ONSET OF MIGRAINE. MAY REPEAT IN 2 HOURS IF HEADACHE PERSISTS. MAX OF 4 TABLETS IN 24 HOURS   Vibegron (GEMTESA) 75 MG TABS Takes  1 tablet  Daily  for Bladder control     Allergies  Allergen Reactions   Erythromycin Other (See Comments)    TBS   Iodinated Contrast Media Other (See Comments)    Hot flashes, itching scalp Hot flashes, itching scalp   Penicillins Hives   Clarithromycin Other (See Comments)    Itching ankles    Brintellix [Vortioxetine]     Itching   Cholestatin      PMHx:   Past Medical History:  Diagnosis Date   Adnexal mass 04/08/2020   Anemia    Depression    Extrinsic asthma 11/09/2014   Gait abnormality 12/26/2019   GERD (gastroesophageal reflux disease)    Hay fever    Head trauma    IBS (irritable bowel syndrome)    Memory loss    Migraines    OSA on CPAP    Reflux    RLS (restless legs syndrome)    Sinus congestion      Immunization History  Administered Date(s) Administered   COVID-19, mRNA, vacc 05/26/2022   Influenza, High Dose  06/12/2016, 05/13/2017, 06/25/2020   Influenza 06/02/2015, 05/19/2018, 05/19/2019, 05/22/2022   PFIZER SARS-COV-2 Vacc 09/12/2019, 10/03/2019, 04/30/2020, 12/08/2020   PPD Test 08/19/2011, 08/30/2013, 11/09/2014   Pfizer Covid-19 Vacc Bivalent 05/08/2021   Pneumococcal -13 08/18/2009, 02/18/2019   Pneumococcal -23 06/12/2016   Td 08/19/2003   Tdap 08/30/2013   Zoster Recombinat (Shingrix) 10/07/2021, 12/09/2021   Zoster, Live 08/18/2010     Past Surgical History:  Procedure Laterality Date   ABDOMINAL HYSTERECTOMY     BREAST BIOPSY Right 02/27/2014   Stereo- Benign   CHOLECYSTECTOMY  11/03/2017   Dr. Derrell Lolling   LAPAROSCOPIC HYSTERECTOMY  12/2017   NASAL SINUS SURGERY     ORIF ANKLE FRACTURE BIMALLEOLAR Left 03/14/2019   [facility], Dr. Katrina Stack   perforated  eardrum     left side   TONSILLECTOMY AND ADENOIDECTOMY     URETEROSCOPY     for ureteral stone     FHx:    Reviewed / unchanged   SHx:    Reviewed / unchanged    Systems Review:  Constitutional: Denies fever, chills, wt changes, headaches, insomnia, fatigue, night sweats, change in appetite. Eyes: Denies redness, blurred vision, diplopia, discharge, itchy, watery eyes.  ENT: Denies discharge, congestion, post nasal drip, epistaxis, sore throat, earache, hearing loss, dental pain, tinnitus, vertigo, sinus pain, snoring.  CV: Denies chest pain, palpitations, irregular  heartbeat, syncope, dyspnea, diaphoresis, orthopnea, PND, claudication or edema. Respiratory: denies cough, dyspnea, DOE, pleurisy, hoarseness, laryngitis, wheezing.  Gastrointestinal: Denies dysphagia, odynophagia, heartburn, reflux, water brash, abdominal pain or cramps, nausea, vomiting, bloating, diarrhea, constipation, hematemesis, melena, hematochezia  or hemorrhoids. Genitourinary: Denies dysuria, frequency, urgency, nocturia, hesitancy, discharge, hematuria or flank pain. Musculoskeletal: Denies arthralgias, myalgias, stiffness, jt. swelling, pain, limping or strain/sprain.  Skin: Denies pruritus, rash, hives, warts, acne, eczema or change in skin lesion(s). Neuro: No weakness, tremor, incoordination, spasms, paresthesia or pain. Psychiatric: Denies confusion, memory loss or sensory loss. Endo: Denies change in weight, skin or hair change.  Heme/Lymph: No excessive bleeding, bruising or enlarged lymph nodes.   Physical Exam  BP 122/70   Pulse 90   Temp 97.9 F (36.6 C)   Resp 17   Ht 5' 6.5" (1.689 m)   Wt 175 lb 6.4 oz (79.6 kg)   SpO2 98%   BMI 27.89 kg/m   Appears  well nourished, well groomed  and in no distress.  Eyes: PERRLA, EOMs, conjunctiva no swelling or erythema. Sinuses: No frontal/maxillary tenderness ENT/Mouth: EAC's clear, TM's nl w/o erythema, bulging. Nares clear w/o erythema, swelling, exudates. Oropharynx clear without erythema or exudates. Oral hygiene is good. Tongue normal, non obstructing. Hearing intact.  Neck: Supple. Thyroid not palpable. Car 2+/2+ without bruits, nodes or JVD. Chest: Respirations nl with BS clear & equal w/o rales, rhonchi, wheezing or stridor.  Cor: Heart sounds normal w/ regular rate and rhythm without sig. murmurs, gallops, clicks or rubs. Peripheral pulses normal and equal  without edema.  Abdomen: Soft & bowel sounds normal. Non-tender w/o guarding, rebound, hernias, masses or organomegaly.  Lymphatics: Unremarkable.   Musculoskeletal: Full ROM all peripheral extremities, joint stability, 5/5 strength and normal gait.  Skin: Warm, dry without exposed rashes, lesions or ecchymosis apparent.  Neuro: Cranial nerves intact, reflexes equal bilaterally. Sensory-motor testing grossly intact. Tendon reflexes grossly intact.  Pysch: Alert & oriented x 3.  Insight and judgement nl & appropriate. No ideations.   Assessment and Plan:  1. Labile hypertension  - Continue medication, monitor blood pressure at home.  - Continue DASH diet.  Reminder to go to the ER if any CP,  SOB, nausea, dizziness, severe HA, changes vision/speech.    - CBC with Differential/Platelet - COMPLETE METABOLIC PANEL WITH GFR - Magnesium - TSH   2. Hyperlipidemia associated with type 2 diabetes mellitus (HCC)  - Continue diet/meds, exercise,& lifestyle modifications.  - Continue monitor periodic cholesterol/liver & renal functions     - Lipid panel - TSH   3. Type 2 diabetes mellitus with stage 3a chronic kidney  disease, without long-term current use of insulin (HCC)  - Continue diet, exercise  - Lifestyle modifications.  - Monitor appropriate labs    - Hemoglobin A1c - Insulin, random   4. Vitamin D deficiency  - Continue supplementation.    -  VITAMIN D 25 Hydroxy    5. Major depression, recurrent, chronic (HCC)  - TSH   6. OSA on CPAP   7. Medication management - CBC with Differential/Platelet - COMPLETE METABOLIC PANEL WITH GFR - Magnesium - Lipid panel - TSH - Hemoglobin A1c - Insulin, random - VITAMIN D 25 Hydroxy             Discussed  regular exercise, BP monitoring, weight control to achieve/maintain BMI less than 25 and discussed med and SE's. Recommended labs to assess /monitor clinical status .  I discussed the assessment and treatment plan with the patient. The patient was provided an opportunity to ask questions and all were answered. The patient agreed with the plan and demonstrated  an understanding of the instructions.  I provided over 30 minutes of exam, counseling, chart review and  complex critical decision making.        The patient was advised to call back or seek an in-person evaluation if the symptoms worsen or if the condition fails to improve as anticipated.   Marinus Maw, MD

## 2023-02-17 NOTE — Patient Instructions (Signed)

## 2023-02-18 DIAGNOSIS — R350 Frequency of micturition: Secondary | ICD-10-CM | POA: Diagnosis not present

## 2023-02-18 LAB — COMPLETE METABOLIC PANEL WITH GFR
AG Ratio: 2.2 (calc) (ref 1.0–2.5)
ALT: 46 U/L — ABNORMAL HIGH (ref 6–29)
AST: 63 U/L — ABNORMAL HIGH (ref 10–35)
Albumin: 5 g/dL (ref 3.6–5.1)
Alkaline phosphatase (APISO): 74 U/L (ref 37–153)
BUN/Creatinine Ratio: 18 (calc) (ref 6–22)
BUN: 20 mg/dL (ref 7–25)
CO2: 23 mmol/L (ref 20–32)
Calcium: 10.2 mg/dL (ref 8.6–10.4)
Chloride: 104 mmol/L (ref 98–110)
Creat: 1.09 mg/dL — ABNORMAL HIGH (ref 0.60–1.00)
Globulin: 2.3 g/dL (calc) (ref 1.9–3.7)
Glucose, Bld: 126 mg/dL — ABNORMAL HIGH (ref 65–99)
Potassium: 4.1 mmol/L (ref 3.5–5.3)
Sodium: 139 mmol/L (ref 135–146)
Total Bilirubin: 0.8 mg/dL (ref 0.2–1.2)
Total Protein: 7.3 g/dL (ref 6.1–8.1)
eGFR: 54 mL/min/{1.73_m2} — ABNORMAL LOW (ref >=60)

## 2023-02-18 LAB — LIPID PANEL
Cholesterol: 135 mg/dL (ref <200)
HDL: 61 mg/dL (ref >=50)
LDL Cholesterol (Calc): 51 mg/dL (calc)
Non-HDL Cholesterol (Calc): 74 mg/dL (calc) (ref <130)
Total CHOL/HDL Ratio: 2.2 (calc) (ref <5.0)
Triglycerides: 146 mg/dL (ref <150)

## 2023-02-18 LAB — CBC WITH DIFFERENTIAL/PLATELET
Absolute Monocytes: 569 cells/uL (ref 200–950)
Basophils Absolute: 62 cells/uL (ref 0–200)
Basophils Relative: 0.8 %
Eosinophils Absolute: 62 cells/uL (ref 15–500)
Eosinophils Relative: 0.8 %
HCT: 40.2 % (ref 35.0–45.0)
Hemoglobin: 12.8 g/dL (ref 11.7–15.5)
Lymphs Abs: 1365 cells/uL (ref 850–3900)
MCH: 27.2 pg (ref 27.0–33.0)
MCHC: 31.8 g/dL — ABNORMAL LOW (ref 32.0–36.0)
MCV: 85.4 fL (ref 80.0–100.0)
MPV: 9.4 fL (ref 7.5–12.5)
Monocytes Relative: 7.3 %
Neutro Abs: 5741 cells/uL (ref 1500–7800)
Neutrophils Relative %: 73.6 %
Platelets: 357 10*3/uL (ref 140–400)
RBC: 4.71 10*6/uL (ref 3.80–5.10)
RDW: 13.5 % (ref 11.0–15.0)
Total Lymphocyte: 17.5 %
WBC: 7.8 10*3/uL (ref 3.8–10.8)

## 2023-02-18 LAB — HEMOGLOBIN A1C
Hgb A1c MFr Bld: 5.5 % of total Hgb (ref <5.7)
Mean Plasma Glucose: 111 mg/dL
eAG (mmol/L): 6.2 mmol/L

## 2023-02-18 LAB — MAGNESIUM: Magnesium: 1.9 mg/dL (ref 1.5–2.5)

## 2023-02-18 LAB — INSULIN, RANDOM: Insulin: 76.4 u[IU]/mL — ABNORMAL HIGH

## 2023-02-18 LAB — VITAMIN D 25 HYDROXY (VIT D DEFICIENCY, FRACTURES): Vit D, 25-Hydroxy: 61 ng/mL (ref 30–100)

## 2023-02-18 LAB — TSH: TSH: 3.55 mIU/L (ref 0.40–4.50)

## 2023-02-18 NOTE — Progress Notes (Signed)
^<^<^<^<^<^<^<^<^<^<^<^<^<^<^<^<^<^<^<^<^<^<^<^<^<^<^<^<^<^<^<^<^<^<^<^<^ ^>^>^>^>^>^>^>^>^>^>^>>^>^>^>^>^>^>^>^>^>^>^>^>^>^>^>^>^>^>^>^>^>^>^>^>^>  -  Test results slightly outside the reference range are not unusual. If there is anything important, I will review this with you,  otherwise it is considered normal test values.  If you have further questions,  please do not hesitate to contact me at the office or via My Chart.   ^<^<^<^<^<^<^<^<^<^<^<^<^<^<^<^<^<^<^<^<^<^<^<^<^<^<^<^<^<^<^<^<^<^<^<^<^ ^>^>^>^>^>^>^>^>^>^>^>^>^>^>^>^>^>^>^>^>^>^>^>^>^>^>^>^>^>^>^>^>^>^>^>^>^  -  A1c is Much Much Better - Down from 6.2%  to now 5.5%                                                                                & back in Normal nonDiabetic range   ^<^<^<^<^<^<^<^<^<^<^<^<^<^<^<^<^<^<^<^<^<^<^<^<^<^<^<^<^<^<^<^<^<^<^<^<^ ^>^>^>^>^>^>^>^>^>^>^>^>^>^>^>^>^>^>^>^>^>^>^>^>^>^>^>^>^>^>^>^>^>^>^>^>^  -  A1c = 61 - Great   !         Please keep dosage same   ^<^<^<^<^<^<^<^<^<^<^<^<^<^<^<^<^<^<^<^<^<^<^<^<^<^<^<^<^<^<^<^<^<^<^<^<^ ^>^>^>^>^>^>^>^>^>^>^>^>^>^>^>^>^>^>^>^>^>^>^>^>^>^>^>^>^>^>^>^>^>^>^>^>^  -  Chol = 135   &  LDL = 51  - Both  Excellent   - Very low risk for Heart Attack  / Stroke - Continue Repatha  !   ^>^>^>^>^>^>^>^>^>^>^>^>^>^>^>^>^>^>^>^>^>^>^>^>^>^>^>^>^>^>^>^>^>^>^>^>^ ^>^>^>^>^>^>^>^>^>^>^>^>^>^>^>^>^>^>^>^>^>^>^>^>^>^>^>^>^>^>^>^>^>^>^>^>^  - 2 liver enzymes are still slightly elevated, So avoid Alcohol   - will continue to monitor. ^<^<^<^<^<^<^<^<^<^<^<^<^<^<^<^<^<^<^<^<^<^<^<^<^<^<^<^<^<^<^<^<^<^<^<^<^ ^>^>^>^>^>^>^>^>^>^>^>^>^>^>^>^>^>^>^>^>^>^>^>^>^>^>^>^>^>^>^>^>^>^>^>^>^  -  All Else - CBC - Kidneys - Electrolytes  - Magnesium & Thyroid    - all  Normal / OK  ^<^<^<^<^<^<^<^<^<^<^<^<^<^<^<^<^<^<^<^<^<^<^<^<^<^<^<^<^<^<^<^<^<^<^<^<^ ^>^>^>^>^>^>^>^>^>^>^>^>^>^>^>^>^>^>^>^>^>^>^>^>^>^>^>^>^>^>^>^>^>^>^>^>^

## 2023-03-04 ENCOUNTER — Encounter: Payer: Self-pay | Admitting: Emergency Medicine

## 2023-03-04 DIAGNOSIS — F0280 Dementia in other diseases classified elsewhere without behavioral disturbance: Secondary | ICD-10-CM

## 2023-03-06 ENCOUNTER — Other Ambulatory Visit: Payer: Self-pay | Admitting: Emergency Medicine

## 2023-03-06 DIAGNOSIS — F0280 Dementia in other diseases classified elsewhere without behavioral disturbance: Secondary | ICD-10-CM

## 2023-03-11 ENCOUNTER — Encounter: Payer: Self-pay | Admitting: Internal Medicine

## 2023-03-13 ENCOUNTER — Other Ambulatory Visit: Payer: Medicare Other

## 2023-03-16 ENCOUNTER — Ambulatory Visit
Admission: RE | Admit: 2023-03-16 | Discharge: 2023-03-16 | Disposition: A | Discharge status: Home / Self Care | Payer: No Typology Code available for payment source | Source: Ambulatory Visit | Attending: Emergency Medicine | Admitting: Emergency Medicine

## 2023-03-16 DIAGNOSIS — F0280 Dementia in other diseases classified elsewhere without behavioral disturbance: Secondary | ICD-10-CM

## 2023-03-16 NOTE — Discharge Instructions (Signed)
Lumbar Puncture Discharge Instructions  Go home and rest quietly as needed. You may resume normal activities; however, do not exert yourself strongly or do any heavy lifting today and tomorrow.   DO NOT drive today.    You may resume your normal diet and medications unless otherwise indicated. Drink lots of extra fluids today and tomorrow.   The incidence of headache, nausea, or vomiting is about 5% (one in 20 patients).  If you develop a headache, lie flat for 24 hours and drink plenty of fluids until the headache goes away.  Caffeinated beverages may be helpful. If when you get up you still have a headache when standing, go back to bed and force fluids for another 24 hours.   If you develop severe nausea and vomiting or a headache that does not go away with the flat bedrest after 48 hours, please call [phone].   Call your physician for a follow-up appointment.  The results of your Lumbar Puncture will be sent directly to your physician and they will contact you.   If you have any questions or if complications develop after you arrive home, please call [phone].  Discharge instructions have been explained to the patient.  The patient, or the person responsible for the patient, fully understands these instructions.   YOU MAY RESUME YOUR ASPIRIN TODAY, POST PROCEDURE  Thank you for visiting our office today.

## 2023-03-20 DIAGNOSIS — Z20822 Contact with and (suspected) exposure to covid-19: Secondary | ICD-10-CM | POA: Diagnosis not present

## 2023-03-20 DIAGNOSIS — B349 Viral infection, unspecified: Secondary | ICD-10-CM | POA: Diagnosis not present

## 2023-03-20 DIAGNOSIS — L089 Local infection of the skin and subcutaneous tissue, unspecified: Secondary | ICD-10-CM | POA: Diagnosis not present

## 2023-03-20 DIAGNOSIS — J029 Acute pharyngitis, unspecified: Secondary | ICD-10-CM | POA: Diagnosis not present

## 2023-03-24 ENCOUNTER — Other Ambulatory Visit: Payer: Self-pay

## 2023-03-24 DIAGNOSIS — E782 Mixed hyperlipidemia: Secondary | ICD-10-CM

## 2023-03-24 MED ORDER — REPATHA 140 MG/ML ~~LOC~~ SOSY: 1.0000 mL | PREFILLED_SYRINGE | SUBCUTANEOUS | 12 refills | Status: DC

## 2023-03-30 DIAGNOSIS — E559 Vitamin D deficiency, unspecified: Secondary | ICD-10-CM | POA: Diagnosis not present

## 2023-03-30 DIAGNOSIS — Z136 Encounter for screening for cardiovascular disorders: Secondary | ICD-10-CM | POA: Diagnosis not present

## 2023-03-30 DIAGNOSIS — Z79899 Other long term (current) drug therapy: Secondary | ICD-10-CM | POA: Diagnosis not present

## 2023-03-30 DIAGNOSIS — Z1159 Encounter for screening for other viral diseases: Secondary | ICD-10-CM | POA: Diagnosis not present

## 2023-03-30 DIAGNOSIS — E538 Deficiency of other specified B group vitamins: Secondary | ICD-10-CM | POA: Diagnosis not present

## 2023-04-02 DIAGNOSIS — R350 Frequency of micturition: Secondary | ICD-10-CM | POA: Diagnosis not present

## 2023-04-06 ENCOUNTER — Other Ambulatory Visit: Payer: Self-pay | Admitting: Nurse Practitioner

## 2023-04-06 ENCOUNTER — Telehealth: Payer: Self-pay

## 2023-04-06 DIAGNOSIS — K760 Fatty (change of) liver, not elsewhere classified: Secondary | ICD-10-CM

## 2023-04-06 DIAGNOSIS — E782 Mixed hyperlipidemia: Secondary | ICD-10-CM

## 2023-04-06 DIAGNOSIS — R748 Abnormal levels of other serum enzymes: Secondary | ICD-10-CM

## 2023-04-06 NOTE — Telephone Encounter (Signed)
PA for Repatha completed and submitted through cover my meds.

## 2023-04-07 MED ORDER — REPATHA 140 MG/ML ~~LOC~~ SOSY: 1.0000 mL | PREFILLED_SYRINGE | SUBCUTANEOUS | 12 refills | Status: DC

## 2023-04-07 NOTE — Telephone Encounter (Signed)
PA approved.

## 2023-04-07 NOTE — Addendum Note (Signed)
Addended by: Dionicio Stall on: 04/07/2023 10:15 AM   Modules accepted: Orders

## 2023-04-14 ENCOUNTER — Ambulatory Visit
Admission: RE | Admit: 2023-04-14 | Discharge: 2023-04-14 | Disposition: A | Discharge status: Home / Self Care | Payer: Medicare Other | Source: Ambulatory Visit | Attending: Nurse Practitioner | Admitting: Nurse Practitioner

## 2023-04-14 DIAGNOSIS — E785 Hyperlipidemia, unspecified: Secondary | ICD-10-CM | POA: Diagnosis not present

## 2023-04-14 DIAGNOSIS — R748 Abnormal levels of other serum enzymes: Secondary | ICD-10-CM

## 2023-04-14 DIAGNOSIS — Z0001 Encounter for general adult medical examination with abnormal findings: Secondary | ICD-10-CM | POA: Diagnosis not present

## 2023-04-14 DIAGNOSIS — K760 Fatty (change of) liver, not elsewhere classified: Secondary | ICD-10-CM

## 2023-04-14 DIAGNOSIS — E1122 Type 2 diabetes mellitus with diabetic chronic kidney disease: Secondary | ICD-10-CM | POA: Diagnosis not present

## 2023-04-14 DIAGNOSIS — N1831 Chronic kidney disease, stage 3a: Secondary | ICD-10-CM | POA: Diagnosis not present

## 2023-04-14 DIAGNOSIS — Z7189 Other specified counseling: Secondary | ICD-10-CM | POA: Diagnosis not present

## 2023-04-14 DIAGNOSIS — G3184 Mild cognitive impairment, so stated: Secondary | ICD-10-CM | POA: Diagnosis not present

## 2023-04-15 ENCOUNTER — Ambulatory Visit
Admission: RE | Admit: 2023-04-15 | Discharge: 2023-04-15 | Disposition: A | Discharge status: Home / Self Care | Payer: Medicare Other | Source: Ambulatory Visit | Attending: Nurse Practitioner | Admitting: Nurse Practitioner

## 2023-04-15 ENCOUNTER — Encounter (INDEPENDENT_AMBULATORY_CARE_PROVIDER_SITE_OTHER): Payer: Self-pay

## 2023-04-15 ENCOUNTER — Other Ambulatory Visit: Payer: Self-pay | Admitting: Nurse Practitioner

## 2023-04-15 DIAGNOSIS — Z1231 Encounter for screening mammogram for malignant neoplasm of breast: Secondary | ICD-10-CM

## 2023-04-15 DIAGNOSIS — G3184 Mild cognitive impairment, so stated: Secondary | ICD-10-CM

## 2023-04-16 ENCOUNTER — Other Ambulatory Visit: Payer: Self-pay | Admitting: Nurse Practitioner

## 2023-04-16 DIAGNOSIS — E2839 Other primary ovarian failure: Secondary | ICD-10-CM

## 2023-05-01 ENCOUNTER — Ambulatory Visit
Admission: RE | Admit: 2023-05-01 | Discharge: 2023-05-01 | Disposition: A | Discharge status: Home / Self Care | Payer: Medicare Other | Source: Ambulatory Visit | Attending: Nurse Practitioner

## 2023-05-01 ENCOUNTER — Encounter: Payer: Self-pay | Admitting: Nurse Practitioner

## 2023-05-01 DIAGNOSIS — G3184 Mild cognitive impairment, so stated: Secondary | ICD-10-CM

## 2023-05-01 MED ORDER — GADOPICLENOL 0.5 MMOL/ML IV SOLN
8.0000 mL | Freq: Once | INTRAVENOUS | Status: AC | PRN
  Administered 2023-05-01: 8 mL via INTRAVENOUS

## 2023-05-01 MED ORDER — GADOPICLENOL 0.5 MMOL/ML IV SOLN: 8.0000 mL | Freq: Once | INTRAVENOUS | Status: DC | PRN

## 2023-05-05 ENCOUNTER — Other Ambulatory Visit: Payer: Self-pay | Admitting: Nurse Practitioner

## 2023-05-05 DIAGNOSIS — K760 Fatty (change of) liver, not elsewhere classified: Secondary | ICD-10-CM

## 2023-05-05 DIAGNOSIS — R748 Abnormal levels of other serum enzymes: Secondary | ICD-10-CM

## 2023-05-06 ENCOUNTER — Encounter: Payer: Medicare Other | Admitting: Nurse Practitioner

## 2023-05-07 ENCOUNTER — Encounter: Payer: Medicare Other | Admitting: Nurse Practitioner

## 2023-05-20 ENCOUNTER — Other Ambulatory Visit: Payer: Medicare Other

## 2023-05-21 ENCOUNTER — Ambulatory Visit
Admission: RE | Admit: 2023-05-21 | Discharge: 2023-05-21 | Disposition: A | Discharge status: Home / Self Care | Payer: Medicare Other | Source: Ambulatory Visit | Attending: Nurse Practitioner | Admitting: Nurse Practitioner

## 2023-05-21 DIAGNOSIS — R748 Abnormal levels of other serum enzymes: Secondary | ICD-10-CM

## 2023-05-21 DIAGNOSIS — K760 Fatty (change of) liver, not elsewhere classified: Secondary | ICD-10-CM

## 2023-07-01 ENCOUNTER — Encounter: Payer: Self-pay | Admitting: Psychiatry

## 2023-07-03 ENCOUNTER — Encounter: Payer: Self-pay | Admitting: Physician Assistant

## 2023-07-09 IMAGING — CT CT HEART MORP W/ CTA COR W/ SCORE W/ CA W/CM &/OR W/O CM
4 of 7 series · 8 of 20 positions shown, 9 images · non-contrast
Comparison: None.
COMPARISON: None.

Addendum:
EXAM:
OVER-READ INTERPRETATION  CT CHEST

The following report is an over-read performed by radiologist Dr.
Kviria Bitar [REDACTED] on 09/20/2021. This
over-read does not include interpretation of cardiac or coronary
anatomy or pathology. The coronary CTA interpretation by the
cardiologist is attached.
CLINICAL DATA: Chest pain
Cardiac/Coronary CTA
TECHNIQUE: A non-contrast, gated CT scan was obtained with axial slices of 3 mm
through the heart for calcium scoring. Calcium scoring was performed
using the Agatston method. A 120 kV prospective, gated, contrast
cardiac scan was obtained. Gantry rotation speed was 250 msecs and
collimation was 0.6 mm. Two sublingual nitroglycerin tablets (0.8
mg) were given. The 3D data set was reconstructed in 5% intervals of
the 35-75% of the R-R cycle. Diastolic phases were analyzed on a
dedicated workstation using MPR, MIP, and VRT modes. The patient
received 95 cc of contrast.

[Series 6: ts diast sharp · axial · 0.39mm/px · z∈[-195,-158]mm · 2 of 276 slices shown]
[im 92/276  lung]
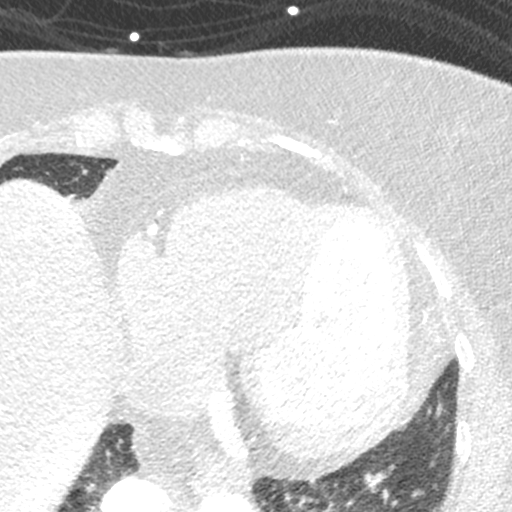
[im 184/276  lung]
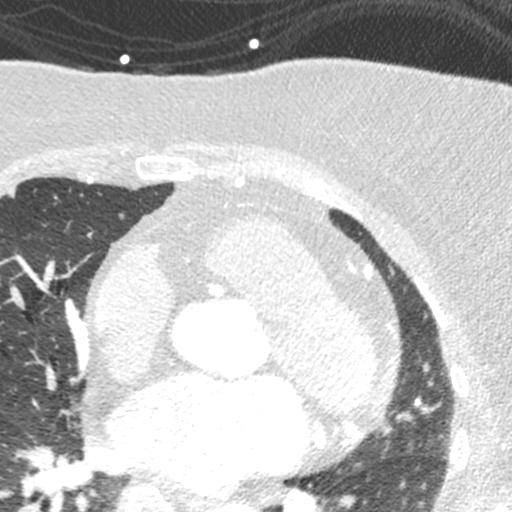

[Series 7: ts syst sharp · axial · 0.39mm/px · z∈[-195,-158]mm · 2 of 276 slices shown]
[im 92/276  lung]
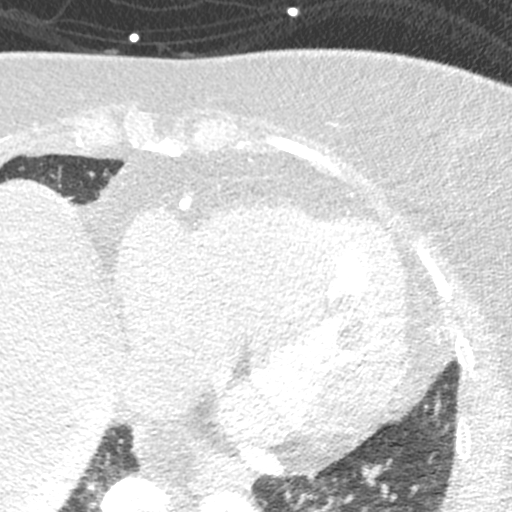
[im 184/276  lung]
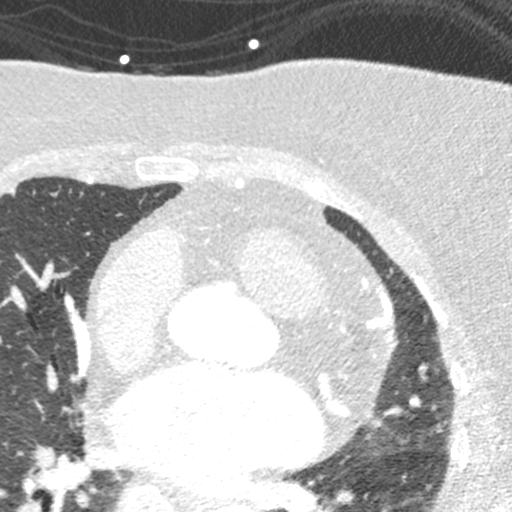

[Series 8: best diast · axial · 0.39mm/px · z∈[-195,-158]mm · 2 of 276 slices shown, 3 images]
[im 92/276  vessel]
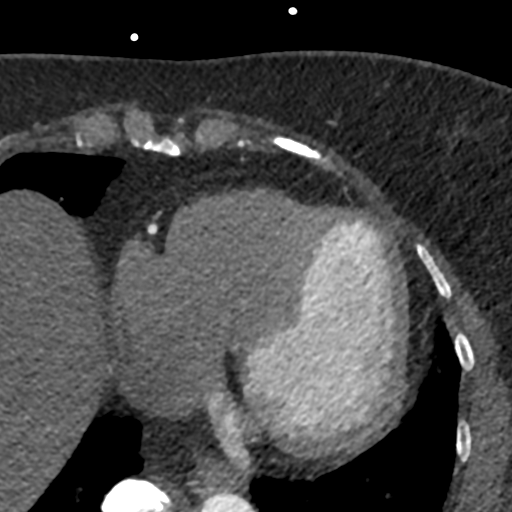
[im 92/276  lung]
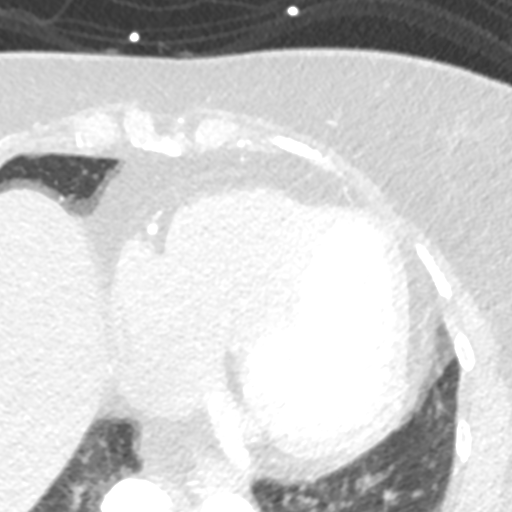
[im 184/276  vessel]
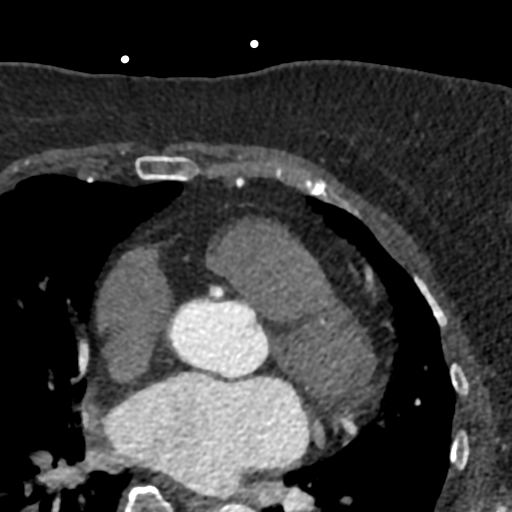

[Series 9: best syst · axial · 0.39mm/px · z∈[-195,-158]mm · 2 of 276 slices shown]
[im 92/276  vessel]
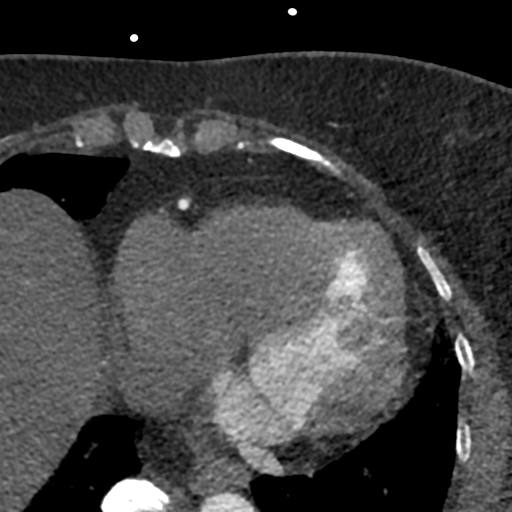
[im 184/276  vessel]
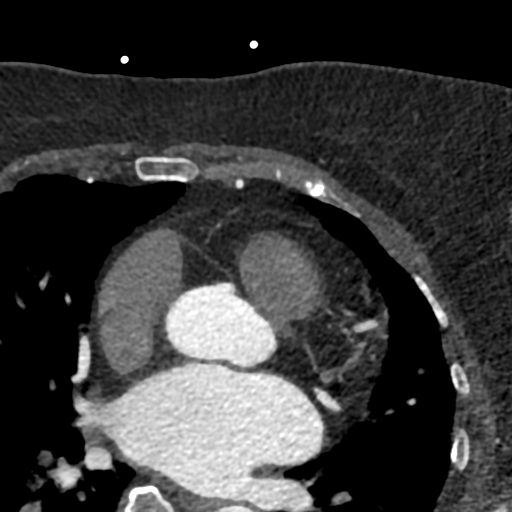

[8 of 20 positions shown; findings below may reference images not displayed]

FINDINGS: Limited view of the lung parenchyma demonstrates no suspicious
nodularity. Airways are normal.

Limited view of the mediastinum demonstrates no adenopathy.
Esophagus normal.

Limited view of the upper abdomen unremarkable.

Limited view of the skeleton and chest wall is unremarkable.
IMPRESSION: No significant extracardiac findings.
FINDINGS: Image quality: Excellent.

Noise artifact is: Limited.

Motion artifact present in mid LCx and LAD.

Coronary Arteries:  Normal coronary origin.  Right dominance.

Left main: The left main is a large caliber vessel with a normal
take off from the left coronary cusp that bifurcates to form a left
anterior descending artery and a left circumflex artery. There is no
plaque or stenosis.

Left anterior descending artery: The LAD is patent without evidence
of plaque or stenosis. The LAD gives off 2 patent diagonal branches.

Left circumflex artery: The LCX is non-dominant and patent with no
evidence of plaque or stenosis. The LCX gives off 2 patent obtuse
marginal branches.

Right coronary artery: The RCA is dominant with normal take off from
the right coronary cusp. There is no evidence of plaque or stenosis.
The RCA terminates as a PDA and right posterolateral branch without
evidence of plaque or stenosis.

Right Atrium: Right atrial size is within normal limits.

Right Ventricle: The right ventricular cavity is within normal
limits.

Left Atrium: Left atrial size is normal in size with no left atrial
appendage filling defect.

Left Ventricle: The ventricular cavity size is within normal limits.
There are no stigmata of prior infarction. There is no abnormal
filling defect.

Pulmonary arteries: Normal in size without proximal filling defect.

Pulmonary veins: Normal pulmonary venous drainage.

Pericardium: Normal thickness with no significant effusion or
calcium present.

Cardiac valves: The aortic valve is trileaflet without significant
calcification. The mitral valve is normal structure with mild mitral
annular calcification of the posterior annulus.

Aorta: Normal caliber with no significant disease.

Extra-cardiac findings: See attached radiology report for
non-cardiac structures.
IMPRESSION: 1. Coronary calcium score of 0. This was 0 percentile for age-, sex,
and race-matched controls.

2.  Normal coronary origin with right dominance.

3.  Normal coronary arteries.  CAD RADS 0.

4.  Consider non cardiac etiologist of chest pain.

RECOMMENDATIONS:
1. CAD-RADS 0: No evidence of CAD (0%). Consider non-atherosclerotic
causes of chest pain.

2. CAD-RADS 1: Minimal non-obstructive CAD (0-24%). Consider
non-atherosclerotic causes of chest pain. Consider preventive
therapy and risk factor modification.

3. CAD-RADS 2: Mild non-obstructive CAD (25-49%). Consider
non-atherosclerotic causes of chest pain. Consider preventive
therapy and risk factor modification.

4. CAD-RADS 3: Moderate stenosis. Consider symptom-guided
anti-ischemic pharmacotherapy as well as risk factor modification
per guideline directed care. Additional analysis with CT FFR will be
submitted.

5. CAD-RADS 4: Severe stenosis. (70-99% or > 50% left main). Cardiac
catheterization or CT FFR is recommended. Consider symptom-guided
anti-ischemic pharmacotherapy as well as risk factor modification
per guideline directed care. Invasive coronary angiography
recommended with revascularization per published guideline
statements.

6. CAD-RADS 5: Total coronary occlusion (100%). Consider cardiac
catheterization or viability assessment. Consider symptom-guided
anti-ischemic pharmacotherapy as well as risk factor modification
per guideline directed care.

7. CAD-RADS N: Non-diagnostic study. Obstructive CAD can't be
excluded. Alternative evaluation is recommended.

*** End of Addendum ***
EXAM:
OVER-READ INTERPRETATION  CT CHEST

The following report is an over-read performed by radiologist Dr.
Kviria Bitar [REDACTED] on 09/20/2021. This
over-read does not include interpretation of cardiac or coronary
anatomy or pathology. The coronary CTA interpretation by the
cardiologist is attached.
FINDINGS: Limited view of the lung parenchyma demonstrates no suspicious
nodularity. Airways are normal.

Limited view of the mediastinum demonstrates no adenopathy.
Esophagus normal.

Limited view of the upper abdomen unremarkable.

Limited view of the skeleton and chest wall is unremarkable.
IMPRESSION: No significant extracardiac findings.

## 2023-07-14 ENCOUNTER — Ambulatory Visit: Payer: Medicare Other | Admitting: Physician Assistant

## 2023-07-14 ENCOUNTER — Ambulatory Visit: Payer: Medicare Other | Admitting: Nurse Practitioner

## 2023-07-14 ENCOUNTER — Encounter: Payer: Self-pay | Admitting: Physician Assistant

## 2023-07-14 ENCOUNTER — Other Ambulatory Visit (INDEPENDENT_AMBULATORY_CARE_PROVIDER_SITE_OTHER): Payer: Medicare Other

## 2023-07-14 ENCOUNTER — Encounter: Payer: Self-pay | Admitting: Nurse Practitioner

## 2023-07-14 VITALS — BP 119/65 | HR 94 | Resp 20 | Ht 66.56 in | Wt 188.0 lb

## 2023-07-14 VITALS — BP 114/70 | HR 96 | Ht 66.5 in | Wt 187.0 lb

## 2023-07-14 DIAGNOSIS — R413 Other amnesia: Secondary | ICD-10-CM | POA: Diagnosis not present

## 2023-07-14 DIAGNOSIS — K219 Gastro-esophageal reflux disease without esophagitis: Secondary | ICD-10-CM

## 2023-07-14 DIAGNOSIS — R7989 Other specified abnormal findings of blood chemistry: Secondary | ICD-10-CM

## 2023-07-14 DIAGNOSIS — Z8711 Personal history of peptic ulcer disease: Secondary | ICD-10-CM

## 2023-07-14 DIAGNOSIS — Z860102 Personal history of hyperplastic colon polyps: Secondary | ICD-10-CM

## 2023-07-14 DIAGNOSIS — D509 Iron deficiency anemia, unspecified: Secondary | ICD-10-CM | POA: Diagnosis not present

## 2023-07-14 DIAGNOSIS — R932 Abnormal findings on diagnostic imaging of liver and biliary tract: Secondary | ICD-10-CM

## 2023-07-14 DIAGNOSIS — R1310 Dysphagia, unspecified: Secondary | ICD-10-CM | POA: Diagnosis not present

## 2023-07-14 LAB — CBC
HCT: 39.3 % (ref 36.0–46.0)
Hemoglobin: 12.7 g/dL (ref 12.0–15.0)
MCHC: 32.2 g/dL (ref 30.0–36.0)
MCV: 86.1 fL (ref 78.0–100.0)
Platelets: 293 10*3/uL (ref 150.0–400.0)
RBC: 4.56 Mil/uL (ref 3.87–5.11)
RDW: 15.7 % — ABNORMAL HIGH (ref 11.5–15.5)
WBC: 6.7 10*3/uL (ref 4.0–10.5)

## 2023-07-14 LAB — IBC + FERRITIN
Ferritin: 37.4 ng/mL (ref 10.0–291.0)
Iron: 72 ug/dL (ref 42–145)
Saturation Ratios: 15.3 % — ABNORMAL LOW (ref 20.0–50.0)
TIBC: 471.8 ug/dL — ABNORMAL HIGH (ref 250.0–450.0)
Transferrin: 337 mg/dL (ref 212.0–360.0)

## 2023-07-14 LAB — COMPREHENSIVE METABOLIC PANEL
ALT: 45 U/L — ABNORMAL HIGH (ref 0–35)
AST: 52 U/L — ABNORMAL HIGH (ref 0–37)
Albumin: 4.5 g/dL (ref 3.5–5.2)
Alkaline Phosphatase: 66 U/L (ref 39–117)
BUN: 22 mg/dL (ref 6–23)
CO2: 25 meq/L (ref 19–32)
Calcium: 9.1 mg/dL (ref 8.4–10.5)
Chloride: 105 meq/L (ref 96–112)
Creatinine, Ser: 1.07 mg/dL (ref 0.40–1.20)
GFR: 51.54 mL/min — ABNORMAL LOW (ref >60.00)
Glucose, Bld: 115 mg/dL — ABNORMAL HIGH (ref 70–99)
Potassium: 4.1 meq/L (ref 3.5–5.1)
Sodium: 139 meq/L (ref 135–145)
Total Bilirubin: 0.5 mg/dL (ref 0.2–1.2)
Total Protein: 7 g/dL (ref 6.0–8.3)

## 2023-07-14 MED ORDER — NA SULFATE-K SULFATE-MG SULF 17.5-3.13-1.6 GM/177ML PO SOLN: 1.0000 | Freq: Once | ORAL | 0 refills | Status: DC

## 2023-07-14 NOTE — Addendum Note (Signed)
Addended by: Marlowe Kays E on: 07/14/2023 05:17 PM   Modules accepted: Level of Service

## 2023-07-14 NOTE — Patient Instructions (Signed)
It was a pleasure to see you today at our office.   Recommendations:   Follow up with [facility]     For psychiatric meds, mood meds: Please have your primary care physician manage these medications.  If you have any severe symptoms of a stroke, or other severe issues such as confusion,severe chills or fever, etc call 911 or go to the ER as you may need to be evaluated further    For assessment of decision of mental capacity and competency:  Call Dr. Erick Blinks, geriatric psychiatrist at [phone]  Counseling regarding caregiver distress, including caregiver depression, anxiety and issues regarding community resources, adult day care programs, adult living facilities, or memory care questions:  please contact your  Primary Doctor's Social Worker   Whom to call: Memory  decline, memory medications: Call our office [phone]    [email or web address]   RECOMMENDATIONS FOR ALL PATIENTS WITH MEMORY PROBLEMS: 1. Continue to exercise (Recommend 30 minutes of walking everyday, or 3 hours every week) 2. Increase social interactions - continue going to [place] and enjoy social gatherings with friends and family 3. Eat healthy, avoid fried foods and eat more fruits and vegetables 4. Maintain adequate blood pressure, blood sugar, and blood cholesterol level. Reducing the risk of stroke and cardiovascular disease also helps promoting better memory. 5. Avoid stressful situations. Live a simple life and avoid aggravations. Organize your time and prepare for the next day in anticipation. 6. Sleep well, avoid any interruptions of sleep and avoid any distractions in the bedroom that may interfere with adequate sleep quality 7. Avoid sugar, avoid sweets as there is a strong link between excessive sugar intake, diabetes, and cognitive impairment We discussed the Mediterranean diet, which has been shown to help patients reduce the risk of progressive  memory disorders and reduces cardiovascular risk. This includes eating fish, eat fruits and green leafy vegetables, nuts like almonds and hazelnuts, walnuts, and also use olive oil. Avoid fast foods and fried foods as much as possible. Avoid sweets and sugar as sugar use has been linked to worsening of memory function.  There is always a concern of gradual progression of memory problems. If this is the case, then we may need to adjust level of care according to patient needs. Support, both to the patient and caregiver, should then be put into place.       DRIVING: Regarding driving, in patients with progressive memory problems, driving will be impaired. We advise to have someone else do the driving if trouble finding directions or if minor accidents are reported. Independent driving assessment is available to determine safety of driving.   If you are interested in the driving assessment, you can contact the following:  [facility] [employer] [facility] in [place] [phone]  [facility] [phone]  [facility] [phone]  [facility] [phone] or [phone]   FALL PRECAUTIONS: Be cautious when walking. Scan the area for obstacles that may increase the risk of trips and falls. When getting up in the mornings, sit up at the edge of the bed for a few minutes before getting out of bed. Consider elevating the bed at the head end to avoid drop of blood pressure when getting up. Walk always in a well-lit room (use night lights in the walls). Avoid area rugs or power cords from appliances in the middle of the walkways. Use a walker or a cane if necessary and consider physical therapy for balance exercise. Get your eyesight checked regularly.  FINANCIAL OVERSIGHT: Supervision,  especially oversight when making financial decisions or transactions is also recommended.  HOME SAFETY: Consider the safety of the kitchen when operating appliances like stoves, microwave  oven, and blender. Consider having supervision and share cooking responsibilities until no longer able to participate in those. Accidents with firearms and other hazards in the house should be identified and addressed as well.   ABILITY TO BE LEFT ALONE: If patient is unable to contact 911 operator, consider using LifeLine, or when the need is there, arrange for someone to stay with patients. Smoking is a fire hazard, consider supervision or cessation. Risk of wandering should be assessed by caregiver and if detected at any point, supervision and safe proof recommendations should be instituted.  MEDICATION SUPERVISION: Inability to self-administer medication needs to be constantly addressed. Implement a mechanism to ensure safe administration of the medications.      Mediterranean Diet A Mediterranean diet refers to food and lifestyle choices that are based on the traditions of countries located on the [employer] [place]. This way of eating has been shown to help prevent certain conditions and improve outcomes for people who have chronic diseases, like kidney disease and heart disease. What are tips for following this plan? Lifestyle  Cook and eat meals together with your family, when possible. Drink enough fluid to keep your urine clear or pale yellow. Be physically active every day. This includes: Aerobic exercise like running or swimming. Leisure activities like gardening, walking, or housework. Get 7-8 hours of sleep each night. If recommended by your health care provider, drink red wine in moderation. This means 1 glass a day for nonpregnant women and 2 glasses a day for men. A glass of wine equals 5 oz (150 mL). Reading food labels  Check the serving size of packaged foods. For foods such as rice and pasta, the serving size refers to the amount of cooked product, not dry. Check the total fat in packaged foods. Avoid foods that have saturated fat or trans fats. Check the ingredients list  for added sugars, such as corn syrup. Shopping  At the grocery store, buy most of your food from the areas near the walls of the store. This includes: Fresh fruits and vegetables (produce). Grains, beans, nuts, and seeds. Some of these may be available in unpackaged forms or large amounts (in bulk). Fresh seafood. Poultry and eggs. Low-fat dairy products. Buy whole ingredients instead of prepackaged foods. Buy fresh fruits and vegetables in-season from local farmers markets. Buy frozen fruits and vegetables in resealable bags. If you do not have access to quality fresh seafood, buy precooked frozen shrimp or canned fish, such as tuna, salmon, or sardines. Buy small amounts of raw or cooked vegetables, salads, or olives from the deli or salad bar at your store. Stock your pantry so you always have certain foods on hand, such as olive oil, canned tuna, canned tomatoes, rice, pasta, and beans. Cooking  Cook foods with extra-virgin olive oil instead of using butter or other vegetable oils. Have meat as a side dish, and have vegetables or grains as your main dish. This means having meat in small portions or adding small amounts of meat to foods like pasta or stew. Use beans or vegetables instead of meat in common dishes like chili or lasagna. Experiment with different cooking methods. Try roasting or broiling vegetables instead of steaming or sauteing them. Add frozen vegetables to soups, stews, pasta, or rice. Add nuts or seeds for added healthy fat at each meal. You can  add these to yogurt, salads, or vegetable dishes. Marinate fish or vegetables using olive oil, lemon juice, garlic, and fresh herbs. Meal planning  Plan to eat 1 vegetarian meal one day each week. Try to work up to 2 vegetarian meals, if possible. Eat seafood 2 or more times a week. Have healthy snacks readily available, such as: Vegetable sticks with hummus. Greek yogurt. Fruit and nut trail mix. Eat balanced meals  throughout the week. This includes: Fruit: 2-3 servings a day Vegetables: 4-5 servings a day Low-fat dairy: 2 servings a day Fish, poultry, or lean meat: 1 serving a day Beans and legumes: 2 or more servings a week Nuts and seeds: 1-2 servings a day Whole grains: 6-8 servings a day Extra-virgin olive oil: 3-4 servings a day Limit red meat and sweets to only a few servings a month What are my food choices? Mediterranean diet Recommended Grains: Whole-grain pasta. Brown rice. Bulgar wheat. Polenta. Couscous. Whole-wheat bread. Orpah Cobb. Vegetables: Artichokes. Beets. Broccoli. Cabbage. Carrots. Eggplant. Green beans. Chard. Kale. Spinach. Onions. Leeks. Peas. Squash. Tomatoes. Peppers. Radishes. Fruits: Apples. Apricots. Avocado. Berries. Bananas. Cherries. Dates. Figs. Grapes. Lemons. Melon. Oranges. Peaches. Plums. Pomegranate. Meats and other protein foods: Beans. Almonds. Sunflower seeds. Pine nuts. Peanuts. Cod. Salmon. Scallops. Shrimp. Tuna. Tilapia. Clams. Oysters. Eggs. Dairy: Low-fat milk. Cheese. Greek yogurt. Beverages: Water. Red wine. Herbal tea. Fats and oils: Extra virgin olive oil. Avocado oil. Grape seed oil. Sweets and desserts: [place] yogurt with honey. Baked apples. Poached pears. Trail mix. Seasoning and other foods: Basil. Cilantro. Coriander. Cumin. Mint. Parsley. Sage. Rosemary. Tarragon. Garlic. Oregano. Thyme. Pepper. Balsalmic vinegar. Tahini. Hummus. Tomato sauce. Olives. Mushrooms. Limit these Grains: Prepackaged pasta or rice dishes. Prepackaged cereal with added sugar. Vegetables: Deep fried potatoes (french fries). Fruits: Fruit canned in syrup. Meats and other protein foods: Beef. Pork. Lamb. Poultry with skin. Hot dogs. Tomasa Blase. Dairy: Ice cream. Sour cream. Whole milk. Beverages: Juice. Sugar-sweetened soft drinks. Beer. Liquor and spirits. Fats and oils: Butter. Canola oil. Vegetable oil. Beef fat (tallow). Lard. Sweets and desserts: Cookies.  Cakes. Pies. Candy. Seasoning and other foods: Mayonnaise. Premade sauces and marinades. The items listed may not be a complete list. Talk with your dietitian about what dietary choices are right for you. Summary The Mediterranean diet includes both food and lifestyle choices. Eat a variety of fresh fruits and vegetables, beans, nuts, seeds, and whole grains. Limit the amount of red meat and sweets that you eat. Talk with your health care provider about whether it is safe for you to drink red wine in moderation. This means 1 glass a day for nonpregnant women and 2 glasses a day for men. A glass of wine equals 5 oz (150 mL). This information is not intended to replace advice given to you by your health care provider. Make sure you discuss any questions you have with your health care provider. Document Released: 03/27/2016 Document Revised: 04/29/2016 Document Reviewed: 03/27/2016 Elsevier Interactive Patient Education  2017 [employer].

## 2023-07-14 NOTE — Patient Instructions (Addendum)
You have been scheduled for an endoscopy and colonoscopy. Please follow the written instructions given to you at your visit today.  Please pick up your prep supplies at the pharmacy within the next 1-3 days.  If you use inhalers (even only as needed), please bring them with you on the day of your procedure.  DO NOT TAKE 7 DAYS PRIOR TO TEST- Trulicity (dulaglutide) Ozempic, Wegovy (semaglutide) Mounjaro (tirzepatide) Bydureon Bcise (exanatide extended release)  DO NOT TAKE 1 DAY PRIOR TO YOUR TEST Rybelsus (semaglutide) Adlyxin (lixisenatide) Victoza (liraglutide) Byetta (exanatide) ___________________________________________________________________________ Your provider has requested that you go to the basement level for lab work before leaving today. Press "B" on the elevator. The lab is located at the first door on the left as you exit the elevator.   Due to recent changes in healthcare laws, you may see the results of your imaging and laboratory studies on MyChart before your provider has had a chance to review them.  We understand that in some cases there may be results that are confusing or concerning to you. Not all laboratory results come back in the same time frame and the provider may be waiting for multiple results in order to interpret others.  Please give [name] 48 hours in order for your provider to thoroughly review all the results before contacting the office for clarification of your results.   Thank you for trusting me with your gastrointestinal care!   Alcide Evener, CRNP

## 2023-07-14 NOTE — Progress Notes (Unsigned)
07/14/2023 Kimberly Ray [id number] 11-10-1950   CHIEF COMPLAINT: Schedule a colonoscopy, anemia   HISTORY OF PRESENT ILLNESS: Kimberly Ray is a 72 year old female with a past medical history of depression, sleep apnea (to start CPAP), migraine headaches, mild memory impairment currently enrolled in a clinical trial, osteoporosis on Fosamax, kidney stones, CKD, IDA, GERD, bleeding ulcer and colon polyps. Past hysterectomy, cholecystectomy and appendectomy. She presents to our office today as referred by Dr. Meriam Sprague brown to schedule a colonoscopy. Patient is known by Dr. Rhea Belton in 2013 had a screening colonoscopy then had 2 EGDs by [facility] 2017 and 2019. Patient wishes to re-establish GI management with Dr. Rhea Belton. She denies having any heartburn for a long time but has "strong belching". She sometimes has difficulty  swallowing large pills and sometimes feels choked when she drinks water. No upper or lower abdominal pain. She typically passes a normal brown formed stool most days. Every once in a while, she passes a large amount of stool in one day "dumps out". No rectal bleeding or black stools. She was found to have iron deficiency anemia within the past year and was started on oral iron with vitamin C once daily. She stated having stomach ulcers a very long time ago. She underwent an EGD 10/2015 by [facility] which showed a small hiatal hernia, multiple nonbleeding duodenal ulcers and a benign nodule in the esophagus. EGD 08/28/2017 showed a normal stomach and duodenum. She underwent a colonoscopy by Dr. Rhea Belton 09/20/2011 which showed diverticulosis to the left colon and one hyperplastic polyp was removed from the rectum. Paternal grandmother with history of colon cancer.   She endorses having a history of mildly elevated LFTs which she contributes to taking Lamotrigine. She underwent a liver elastography study 05/27/2023 which showed low risk for advanced liver disease.      Latest  Ref Rng & Units 02/17/2023   11:32 AM 10/20/2022    3:02 PM 07/24/2022   12:00 AM  CBC  WBC 3.8 - 10.8 Thousand/uL 7.8  5.8  7.3   Hemoglobin 11.7 - 15.5 g/dL 08.6  57.8  46.9   Hematocrit 35.0 - 45.0 % 40.2  34.7  37.7   Platelets 140 - 400 Thousand/uL 357  332  357     10/20/2022: Iron 30. Iron saturation 7%. TIBC 413. Ferritin 25. B12 level > 2,0000  11/05/2022: Hemoccult cards negative x 3 on 11/05/2022      Latest Ref Rng & Units 02/17/2023   11:32 AM 10/20/2022    3:02 PM 07/24/2022   12:00 AM  CMP  Glucose 65 - 99 mg/dL 629  528  413   BUN 7 - 25 mg/dL 20  17  19    Creatinine 0.60 - 1.00 mg/dL 2.44  0.10  2.72   Sodium 135 - 146 mmol/L 139  142  137   Potassium 3.5 - 5.3 mmol/L 4.1  5.0  4.5   Chloride 98 - 110 mmol/L 104  106  99   CO2 20 - 32 mmol/L 23  23  24    Calcium 8.6 - 10.4 mg/dL 53.6  9.9  9.6   Total Protein 6.1 - 8.1 g/dL 7.3  6.7  6.9   Total Bilirubin 0.2 - 1.2 mg/dL 0.8  0.4  0.4   AST 10 - 35 U/L 63  66  47   ALT 6 - 29 U/L 46  43  38     Liver elastography 05/21/2023:  FINDINGS: Liver: No focal lesion identified. Increased parenchymal echogenicity. Portal vein is patent on color Doppler imaging with normal direction of blood flow towards the liver.   ULTRASOUND HEPATIC ELASTOGRAPHY   Device: Siemens Helix VTQ   Patient position: Oblique   Transducer: [id number]   Number of measurements: 10   Hepatic segment:  8   Median kPa: 3.2   IQR: 0.9   IQR/Median kPa ratio: 0.3   Data quality: IQR/Median kPa ratio of 0.3 or greater indicates reduced accuracy   Diagnostic category:  < or = 5 kPa: high probability of being normal   The use of hepatic elastography is applicable to patients with viral hepatitis and non-alcoholic fatty liver disease. At this time, there is insufficient data for the referenced cut-off values and use in other causes of liver disease, including alcoholic liver disease. Patients, however, may be assessed by elastography and serve  as their own reference standard/baseline.   In patients with non-alcoholic liver disease, the values suggesting compensated advanced chronic liver disease (cACLD) may be lower, and patients may need additional testing with elasticity results of 7-9 kPa.   Please note that abnormal hepatic elasticity and shear wave velocities may also be identified in clinical settings other than with hepatic fibrosis, such as: acute hepatitis, elevated right heart and central venous pressures including use of beta blockers, veno-occlusive disease (Budd-Chiari), infiltrative processes such as mastocytosis/amyloidosis/infiltrative tumor/lymphoma, extrahepatic cholestasis, with hyperemia in the post-prandial state, and with liver transplantation. Correlation with patient history, laboratory data, and clinical condition recommended.   Diagnostic Categories:   < or =5 kPa: high probability of being normal   < or =9 kPa: in the absence of other known clinical signs, rules out cACLD   >9 kPa and ?13 kPa: suggestive of cACLD, but needs further testing   >13 kPa: highly suggestive of cACLD   > or =17 kPa: highly suggestive of cACLD with an increased probability of clinically significant portal hypertension   IMPRESSION: ULTRASOUND LIVER:   Increased parenchymal echogenicity, finding can be seen in the setting of hepatic steatosis.   ULTRASOUND HEPATIC ELASTOGRAPHY:   Median kPa:  3.2   Diagnostic category:  < or = 5 kPa: high probability of being normal  ECHO 07/18/2022: IMPRESSIONS Mild intracavitary gradient. Peak velocity 1.55 m/s. Peak gradient 9.7 mmHg. Left ventricular ejection fraction, by estimation, is 55 to 60%. Left ventricular ejection fraction by 2D MOD biplane is 57.5 %. Left ventricular ejection fraction by PLAX is 57 %. The left ventricle has normal function. The left ventricle has no regional wall motion abnormalities. There is mild concentric left ventricular hypertrophy. Left  ventricular diastolic parameters are consistent with Grade I diastolic dysfunction (impaired relaxation). The average left ventricular global longitudinal strain is -19.8 %. The global longitudinal strain is normal. 1. Right ventricular systolic function is normal. The right ventricular size is normal. There is normal pulmonary artery systolic pressure. 2. The mitral valve is normal in structure. Trivial mitral valve regurgitation. No evidence of mitral stenosis. 3. The aortic valve is tricuspid. Aortic valve regurgitation is not visualized. No aortic stenosis is present. 4. The inferior vena cava is normal in size with greater than 50% respiratory variability, suggesting right atrial pressure of 3 mmHg.  Pulmonary Function test 11/18/2022: Normal    PAST GI PROCEDURES:  EGD 08/28/2017 [facility]: Small hiatal hernia Normal stomach Normal duodenum  EGD 11/14/2015:  Small hiatal hernia Nodule found in the esophagus, biopsies Bilious gastric fluid Normal stomach Multiple nonbleeding duodenal  ulcers  1. Surgical [P], gastric biopsy - BENIGN GASTRIC MUCOSA WITH MILD CHRONIC GASTRITIS. - NO INTESTINAL METAPLASIA OR HELICOBACTER PYLORI ORGANISMS IDENTIFIED. 2. Surgical [P], GE junction biopsy of nodule - BENIGN GASTROESOPHAGEAL JUNCTION MUCOSA WITH CHRONIC INFLAMMATION AND HYPERPLASTIC EPITHELIAL CHANGES.  Colonoscopy by Dr. Rhea Belton 10/13/2011: Normal terminal ileum  Moderate diverticulosis left colon Sessile hyperplastic rectal polyp   Past Medical History:  Diagnosis Date   Adnexal mass 04/08/2020   Anemia    Depression    Extrinsic asthma 11/09/2014   Gait abnormality 12/26/2019   GERD (gastroesophageal reflux disease)    Hay fever    Head trauma    IBS (irritable bowel syndrome)    MCI (mild cognitive impairment) with memory loss    Memory loss    Migraines    OSA on CPAP    Reflux    RLS (restless legs syndrome)    Sinus congestion    Past Surgical History:  Procedure  Laterality Date   ABDOMINAL HYSTERECTOMY     BREAST BIOPSY Right 02/27/2014   Stereo- Benign   CHOLECYSTECTOMY  11/03/2017   Dr. Derrell Lolling   LAPAROSCOPIC HYSTERECTOMY  12/2017   NASAL SINUS SURGERY     ORIF ANKLE FRACTURE BIMALLEOLAR Left 03/14/2019   [facility], Dr. Katrina Stack   perforated eardrum     left side   TONSILLECTOMY AND ADENOIDECTOMY     URETEROSCOPY     for ureteral stone   Social History: She is divorced. She has 2 sons. Retired. Nonsmoker. No alcohol use. No drug use.   Family History: family history includes Anxiety disorder in her brother, father, and sister; Bipolar disorder in her brother; Breast cancer in her cousin; Breast cancer (age of onset: 34) in her maternal aunt; Breast cancer (age of onset: 38) in her maternal grandmother; Colon cancer (age of onset: 25) in her paternal grandmother; Dementia in her father; Depression in her sister; Diabetes in her mother and sister; Heart failure in her father; Heart failure (age of onset: 38) in her maternal grandfather; Mental illness in her paternal grandmother; Mood Disorder in her father; Osteoporosis in her brother; Stomach cancer in her maternal aunt.  Allergies  Allergen Reactions   Erythromycin Other (See Comments)    TBS   Iodinated Contrast Media Other (See Comments)    Hot flashes, itching scalp Hot flashes, itching scalp   Penicillins Hives   Clarithromycin Other (See Comments)    Itching ankles   Brintellix [Vortioxetine]     Itching   Cholestatin       Outpatient Encounter Medications as of 07/14/2023  Medication Sig   acetaminophen (TYLENOL) 650 MG CR tablet Take 650 mg by mouth every 8 (eight) hours as needed for pain.   albuterol (VENTOLIN HFA) 108 (90 Base) MCG/ACT inhaler Inhale 2 puffs into the lungs every 6 (six) hours as needed for wheezing or shortness of breath.   alendronate (FOSAMAX) 70 MG tablet Take 70 mg by mouth once a week.   buPROPion (WELLBUTRIN XL) 300 MG 24 hr tablet Take 1 tablet  Daily for Mood, Focus & Concentration.   cholecalciferol (VITAMIN D3) 25 MCG (1000 UNIT) tablet Take 4,000 Units by mouth daily.   Cyanocobalamin 2500 MCG SUBL Place 1 tablet (2,500 mcg total) under the tongue daily.   Evolocumab (REPATHA) 140 MG/ML SOSY Inject 140 mg into the skin every 14 (fourteen) days.   FLUoxetine (PROZAC) 40 MG capsule Takes 1 capsule Daily for Mood   fluticasone (FLONASE) 50 MCG/ACT nasal spray  2 prays each nostril once a day (Patient taking differently: 2 sprays as needed. 2 prays each nostril once a day)   fluticasone-salmeterol (ADVAIR DISKUS) 250-50 MCG/ACT AEPB Inhale 1 puff into the lungs in the morning and at bedtime. (Patient taking differently: Inhale 1 puff into the lungs as needed.)   lamoTRIgine (LAMICTAL) 150 MG tablet Take  1 tablet  Daily  for  Mood                          /                                                      Take                               by                          mouth   ondansetron (ZOFRAN-ODT) 8 MG disintegrating tablet DISSOLVE 1 TABLET ON THE  TONGUE EVERY 8 HOURS AS  NEEDED FOR NAUSEA AND  VOMITING   oxybutynin (DITROPAN-XL) 10 MG 24 hr tablet Take 10 mg by mouth daily.   Oxymetazoline HCl (NASAL SPRAY NA) Place into the nose.   pantoprazole (PROTONIX) 40 MG tablet Take  1 tablet 2 x / day  to Prevent Heartburn &  Indigestion   sodium chloride (OCEAN) 0.65 % nasal spray Place into the nose.   SUMAtriptan (IMITREX) 25 MG tablet TAKE 1 TO 2 TABLETS BY MOUTH WITH ONSET OF MIGRAINE. MAY REPEAT IN 2 HOURS IF HEADACHE PERSISTS. MAX OF 4 TABLETS IN 24 HOURS   [DISCONTINUED] ASPIRIN 81 PO Aspirin   [DISCONTINUED] methylphenidate 18 MG PO CR tablet Takes 2 tablets ( 36 mg) every morning   No facility-administered encounter medications on file as of 07/14/2023.    REVIEW OF SYSTEMS:  Gen: Fatigue. Denies fever, sweats or chills. No weight loss.  CV: Denies chest pain, palpitations or edema. Resp: + Cough and SOB. No hemoptysis.   GI: Denies heartburn, dysphagia, stomach or lower abdominal pain. No diarrhea or constipation.  GU: + Urine leakage. MS: Denies joint pain, muscles aches or weakness. Derm: Denies rash, itchiness, skin lesions or unhealing ulcers. Psych: Mild memory loss and confusion.  Heme: Denies bruising, easy bleeding. Neuro:  Denies headaches, dizziness or paresthesias. Endo:  Denies any problems with DM, thyroid or adrenal function.  PHYSICAL EXAM: BP 114/70 (BP Location: Left Arm, Patient Position: Sitting, Cuff Size: Normal)   Pulse 96   Ht 5' 6.5" (1.689 m)   Wt 187 lb (84.8 kg)   SpO2 95%   BMI 29.73 kg/m  General:  71 year old female in no acute distress. Head: Normocephalic and atraumatic. Eyes:  Sclerae non-icteric, conjunctive pink. Ears: Normal auditory acuity. Mouth: Dentition intact. No ulcers or lesions.  Neck: Supple, no lymphadenopathy or thyromegaly.  Lungs: Clear bilaterally to auscultation without wheezes, crackles or rhonchi. Heart: Regular rate and rhythm. No murmur, rub or gallop appreciated.  Abdomen: Soft, nontender, nondistended. No masses. No hepatosplenomegaly. Normoactive bowel sounds x 4 quadrants.  Rectal: Deferred.  Musculoskeletal: Symmetrical with no gross deformities. Skin: Warm and dry. No rash or lesions on visible extremities. Extremities:  No edema. Neurological: Alert oriented x 4, no focal deficits.  Psychological:  Alert and cooperative. Normal mood and affect.  ASSESSMENT AND PLAN:  72 year old female with GERD, pill dysphagia. Prior history of duodenal ulcers in 2017, possibly had gastric ulcers in the 1990s. -Continue Pantoprazole 40mg  po every day -EGD benefits and risks discussed including risk with sedation, risk of bleeding, perforation and infection  -GERD diet, avoid eating large pieces of meat/bread or rice  Iron deficiency anemia  -EGD and colonoscopy benefits and risks discussed including risk with sedation, risk of bleeding,  perforation and infection  -Patient instructed to hold oral iron tablet for one week prior to EGD/colonoscopy date, restart after procedures completed  -CBC, IBC + ferritin   History of a rectal hyperplastic polyp per colonoscopy 09/2011. Paternal grandmother with history of colon cancer.  -Colonoscopy as ordered above   Elevated LFTs. Liver sonogram with elastography showed a increased parenchyal echogenicity, possible hepatic steatosis, a patent portal vein and the elastography result showed the median KPA 3.2 which indicates low risk for chronic advance  liver disease -Patient encouraged to avoid weight gain, reduce carbohydrates in diet ie: reduce bread/pasta/potatoes/rice and sweets -CMP    CC:  Lucky Cowboy, MD

## 2023-07-14 NOTE — Progress Notes (Signed)
Assessment/Plan:   Kimberly Ray is a very pleasant 72 y.o. year old RH female with a history of hypertension, hyperlipidemia, RLS, IBS, anxiety, depression, OSA on CPAP, GERD, asthma, anemia, DM2, CKD, seen today for evaluation of memory loss.  In the past, she had a neuropsych evaluation in 2022 at our office, yielding a diagnosis of mild cognitive impairment although etiology was unclear, not daily due to Alzheimer's disease but suspecting to be due to depression, and ADD.  Patient was being followed by [facility] regarding memory and OSA, last seen there in January 2024 with MMSE 26/30.   Marland Kitchen  She is on a double-blind clinical trial for dementia, "taking the medicine for erectile dysfunction to test efficacy ". She also had other studies including LP showing A beta 1-42: A beta 1-40 ratio of 0.035, supporting the diagnosis of Alzheimer's disease ( nl >0.073).   She reports that PET amyloid  [facility] but her patient to correlate amyloid plaques but I do not have that evidence during this visit. Discussed with patient the importance of continuity of care. She is also interested in newer drugs such as Lecanemab which could be received at that facility if she were to be a candidate without interfering with the trial. Patient was appreciative of the time spent with [name]. We explained in detail the MRI findings from 05/01/2023, personally reviewed, remarkable for mild chronic small vessel ischemic changes within the cerebral white matter and pons, slightly progressed from the prior MRI of the brain in June 2021, but without age advanced lobar atrophy or acute intracranial abnormalities.  Patient is able to participate on his IADLs and continues to drive without significant difficulties.  MoCA today is 26/30, actually improved from prior testings (she suspects that she is actually receiving the medicine during these trials).   Memory Impairment likely due to Alzheimer's disease  Continue the  double-blind clinical trial for dementia, with erectile dysfunction medicine to test efficacy. She has an appt with [facility] on Jan 2025 for continuity of care .  Continue to control mood as per PCP, and BH Continue using CPAP for OSA at [facility].  Recommend good control of cardiovascular risk factors, recommend close monitoring of her LDLs, highly elevated in the 400s.   Subjective:   The patient is here alone   How long did patient have memory difficulties?  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  Reports some difficulty remembering new information, conversations and names.  Long-term memory is good. "I am having a good week".  repeats oneself?  Endorsed Disoriented when walking into a room?  Patient denies except occasionally not remembering what patient came to the room for.    Leaving objects in unusual places? Denies. One time she looked at her car key and could not figure out what the key was for.   Wandering behavior?  Denies.  Any personality changes? Family said that she may be doing better since the clinical trial  Any history of depression?: Endorsed, sees a therapist and a NP for medicines.    Hallucinations or paranoia?  Endorsed. In the corner of her eye, sometimes she may see a shadow or benign sharp object, but when turning to see if there is nothing. Sometimes she sees "a chair that breathes". This is not new, she attributes it to dehydration.    Seizures?  Denies    Any sleep changes? I sleep too hard  but have to get up to go to the bathroom, always tired, reports vivid dreams, denies REM behavior or sleepwalking   Sleep apnea?  Denies   Any hygiene concerns?  Denies   Independent of bathing and dressing?  Endorsed  Does the patient needs help with medications? Patient is in charge . Has a difficulty time sorting the medications. Who is in charge of the finances? Patient is in charge, on auto pay, as she was having a hard time  with calendars Any changes in appetite?  Denies. Tries to drink water.      Patient have trouble swallowing? Denies.   Does the patient cook? Not much. Any kitchen accidents such as leaving the stove on? Denies.   Any history of headaches? Endorsed, takes Imitrex as needed . After menopause it improved   Chronic pain ? Denies.   Ambulates with difficulty?  She has peripheral neuropathy Recent falls or head injuries? Denies.   Vision changes? Denies.   Unilateral weakness, numbness or tingling? Denies.   Any tremors?   Denies.   Any anosmia?  Denies.   Any incontinence of urine?  She has stress and urge incontinence.   Any bowel dysfunction? Denies.      Patient lives alone History of heavy alcohol intake? Denies.   History of heavy tobacco use? Denies.   Family history of dementia?  Father had dementia of unknown etiology. Does patient drive? Yes, short distances issues, denies getting lost. Today however she had to think about the route to take to get there.     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 College education   Recent labs TG 461, B12 882, vitamin D 51, amiloride ratio 0.035 (low), TSH 3.63   MRI of the brain, personally reviewed without age advanced or lobar predominant parenchymal atrophy, remarkable for mild chronic small vessel ischemic changes within the cerebral white matter and pons, slightly progressed from prior brain MRI on 01/20/2020.  No acute intracranial findings.  Past Medical History:  Diagnosis Date   Adnexal mass 04/08/2020   Anemia    Depression    Extrinsic asthma 11/09/2014   Gait abnormality 12/26/2019   GERD (gastroesophageal reflux disease)    Hay fever    Head trauma    IBS (irritable bowel syndrome)    Memory loss    Migraines    OSA on CPAP    Reflux    RLS (restless legs syndrome)    Sinus congestion      Past Surgical History:  Procedure Laterality Date   ABDOMINAL HYSTERECTOMY     BREAST BIOPSY Right  02/27/2014   Stereo- Benign   CHOLECYSTECTOMY  11/03/2017   Dr. Derrell Lolling   LAPAROSCOPIC HYSTERECTOMY  12/2017   NASAL SINUS SURGERY     ORIF ANKLE FRACTURE BIMALLEOLAR Left 03/14/2019   [facility], Dr. Katrina Stack   perforated eardrum     left side   TONSILLECTOMY AND ADENOIDECTOMY     URETEROSCOPY     for ureteral stone     Allergies  Allergen Reactions   Erythromycin Other (See Comments)    TBS   Iodinated Contrast Media Other (See Comments)    Hot flashes, itching scalp Hot flashes, itching scalp   Penicillins Hives   Clarithromycin Other (See Comments)    Itching ankles   Brintellix [Vortioxetine]     Itching   Cholestatin     Current Outpatient Medications  Medication Instructions   acetaminophen (TYLENOL) 650 mg,  Oral, Every 8 hours PRN   albuterol (VENTOLIN HFA) 108 (90 Base) MCG/ACT inhaler 2 puffs, Inhalation, Every 6 hours PRN   alendronate (FOSAMAX) 70 mg, Oral, Weekly   ASPIRIN 81 PO Aspirin   buPROPion (WELLBUTRIN XL) 300 MG 24 hr tablet Take 1 tablet Daily for Mood, Focus & Concentration.   cholecalciferol (VITAMIN D3) 4,000 Units, Oral, Daily   Cyanocobalamin 2,500 mcg, Sublingual, Daily   FLUoxetine (PROZAC) 40 MG capsule Takes 1 capsule Daily for Mood   fluticasone (FLONASE) 50 MCG/ACT nasal spray 2 prays each nostril once a day   fluticasone-salmeterol (ADVAIR DISKUS) 250-50 MCG/ACT AEPB 1 puff, Inhalation, 2 times daily   lamoTRIgine (LAMICTAL) 150 MG tablet Take  1 tablet  Daily  for  Mood                          /                                                      Take                               by                          mouth   methylphenidate 18 MG PO CR tablet Takes 2 tablets ( 36 mg) every morning   ondansetron (ZOFRAN-ODT) 8 MG disintegrating tablet DISSOLVE 1 TABLET ON THE  TONGUE EVERY 8 HOURS AS  NEEDED FOR NAUSEA AND  VOMITING   Oxymetazoline HCl (NASAL SPRAY NA) Nasal   pantoprazole (PROTONIX) 40 MG tablet Take  1 tablet 2 x / day  to  Prevent Heartburn &  Indigestion   Repatha 140 mg, Subcutaneous, Every 14 days   sodium chloride (OCEAN) 0.65 % nasal spray Nasal   SUMAtriptan (IMITREX) 25 MG tablet TAKE 1 TO 2 TABLETS BY MOUTH WITH ONSET OF MIGRAINE. MAY REPEAT IN 2 HOURS IF HEADACHE PERSISTS. MAX OF 4 TABLETS IN 24 HOURS     VITALS:   Vitals:   07/14/23 0949  BP: 119/65  Pulse: 94  Resp: 20  SpO2: 95%  Weight: 188 lb (85.3 kg)  Height: 5' 6.56" (1.691 m)      PHYSICAL EXAM   HEENT:  Normocephalic, atraumatic. The superficial temporal arteries are without ropiness or tenderness. Cardiovascular: Regular rate and rhythm. Lungs: Clear to auscultation bilaterally. Neck: There are no carotid bruits noted bilaterally.  NEUROLOGICAL:    07/14/2023   11:00 AM 09/14/2020   11:00 AM  Montreal Cognitive Assessment   Visuospatial/ Executive (0/5) 5 2  Naming (0/3) 3 3  Attention: Read list of digits (0/2) 2 1  Attention: Read list of letters (0/1) 1 1  Attention: Serial 7 subtraction starting at 100 (0/3) 2 2  Language: Repeat phrase (0/2) 1 1  Language : Fluency (0/1) 1 0  Abstraction (0/2) 2 1  Delayed Recall (0/5) 3 3  Orientation (0/6) 6 6  Total 26 20  Adjusted Score (based on education) 26 21       09/02/2022    2:21 PM 08/15/2021    9:17 AM 10/25/2020    1:12 PM  MMSE - Mini Mental State  Exam  Orientation to time 4 5 5   Orientation to Place 5 5 5   Registration 3 3 3   Attention/ Calculation 2 2 4   Recall 3 3 3   Language- name 2 objects 2 2 2   Language- repeat 1 1 1   Language- follow 3 step command 3 3 3   Language- read & follow direction 1 1 1   Write a sentence 1 1 1   Copy design 1 1 1   Total score 26 27 29      Orientation:  Alert and oriented to person, place and time. No aphasia or dysarthria. Fund of knowledge is appropriate. Recent memory impaired and remote memory intact.  Attention and concentration are normal.  Able to name objects and repeat phrases. Delayed recall  /5 Cranial  nerves: There is good facial symmetry. Extraocular muscles are intact and visual fields are full to confrontational testing. Speech is fluent and clear. No tongue deviation. Hearing is intact to conversational tone. Tone: Tone is good throughout. Sensation: Sensation is intact to light touch. Vibration is intact  Coordination: The patient has no difficulty with RAM's or FNF bilaterally. Normal finger to nose  Motor: Strength is 5/5 in the bilateral upper and lower extremities. There is no pronator drift. There are no fasciculations noted. DTR's: Deep tendon reflexes are 2/4 bilaterally. Gait and Station: The patient is able to ambulate without difficulty The patient is able to heel toe walk . Gait is cautious and narrow. The patient is able to ambulate in a tandem fashion.       Thank you for allowing [name] the opportunity to participate in the care of this nice patient. Please do not hesitate to contact us for any questions or concerns.   Total time spent on today's visit was 62 minutes dedicated to this patient today, preparing to see patient, examining the patient, ordering tests and/or medications and counseling the patient, documenting clinical information in the EHR or other health record, independently interpreting results and communicating results to the patient/family, discussing treatment and goals, answering patient's questions and coordinating care.  Cc:  Lucky Cowboy, MD  Marlowe Kays 07/14/2023 11:58 AM

## 2023-07-22 NOTE — Progress Notes (Signed)
Addendum: Reviewed and agree with assessment and management plan. Kadijah Shamoon M, MD  

## 2023-07-29 ENCOUNTER — Ambulatory Visit: Payer: Medicare Other | Admitting: Physician Assistant

## 2023-07-29 ENCOUNTER — Ambulatory Visit: Payer: Medicare Other

## 2023-09-01 ENCOUNTER — Ambulatory Visit: Payer: Medicare (Managed Care) | Admitting: Neurology

## 2023-09-01 ENCOUNTER — Encounter: Payer: Self-pay | Admitting: Neurology

## 2023-09-01 VITALS — BP 116/68 | HR 86 | Ht 66.5 in | Wt 180.0 lb

## 2023-09-01 DIAGNOSIS — G4733 Obstructive sleep apnea (adult) (pediatric): Secondary | ICD-10-CM | POA: Diagnosis not present

## 2023-09-01 DIAGNOSIS — R413 Other amnesia: Secondary | ICD-10-CM

## 2023-09-01 DIAGNOSIS — G3184 Mild cognitive impairment, so stated: Secondary | ICD-10-CM | POA: Insufficient documentation

## 2023-09-01 NOTE — Progress Notes (Signed)
 PATIENT: Kimberly Ray DOB: [birth year]  REASON FOR VISIT: follow up HISTORY FROM: patient Primary Neurologist: Dr. Chalice since Dr. Jenel has retired   HISTORY OF PRESENT ILLNESS: Today 09/01/23 Had memory consult at [facility] Nov 2024.  Today, MoCA 28/30. Remains in clinical trial for memory with [employer], is in double blind study for memory, drug is for erectile dysfunction, idea is it increases blood flow to the brain. Has been in study 5 months. She feels she is getting the drug. Going to [employer], had MRI brain in Sept 2024. Concerned with memory, short term, enters appointments incorrect on her calendar, math calculation. Living alone, driving, has to be careful with directions has to be intentional. She has felt her memory loss has stabilized since taking trial drug. She has 2 CPAP machines (brought card for download that was issued to someone other than her-robert o'leary), has another machine she keeps at her mother's house, uses it every night. Mood has been well, sees counselor, psychiatry, last year Lamictal  and Wellbutrin  was increased.  CPAP 06/03/23-08/31/23 card, 58% usage, greater than 4 hours 49%, 6 hours 19 minutes.  Set pressure 7.  AHI 2.4, leak 41.5.  09/02/22 SS: MMSE 26/30. Forgot her CPAP today. ESS 21. Seeing therapist, working on medications. Had right knee replacement it went well. RLS are very infrequent. Has increased her magnesium . Having urinary incontinence issues, has yet to see urology. On Prozac , Lamictal , Ritalin  for the mood, she can't tell a difference, she is tired. Uses CPAP nightly. Brother thinks her memory is age related. Considered repeat neuro psych eval this year, initial testing in Jan 2022. Uses nasal mask for CPAP. Addendum 10/02/2022 SS: Data from CPAP 09/01/22-09/30/22 showed usage 28/30 days at 93%, greater than 4 hours 70%, average usage 5 hours 25 minutes, set pressure 7, leak 27.1, AHI 3. 90-day data 07/03/22-09/30/22  75/90 days 83%, greater than 4 hours 76%, leak 26.6, AHI 1.6.  Update 08/15/21 SS: Kimberly Ray is a 73 year old female with multiple complaints here with follow-up:  CPAP: Doing well, has 2 machines, she uses 1 at home, 1 when she goes to her mothers. Has 2 machines to review data from; overall she has about 90% compliance, AHI is 0.6, no significant leak from the mask. Machine is working well.  Can't imagine how she'd feel without it. ESS 18 (mood disorder contributing to responses).  Depression: Hasn't gone to psychiatry, looking for recommendation. [facility], didn't feel comfortable, so no medications changed. PCP started Abilify , dose recently increased, depression improving some, less sleepy. Today is a good day. Slept 8 hours with CPAP on.   RLS: Off Requip , just increased Abilify , few jumps in the legs, still on gabapentin . No more hallucinations.   Others: SOB with exertion, related to wearing mask? Palpations, heart fluttering, almost daily. Started PT for torn right knee mensicus. Joined a gym, but hasn't been going lately. MMSE 27/30 today.   Update 10/25/2020 SS: Memory: She had neuropsychological evaluation with Dr. Jackquline, felt the issue was more related to executive control function, could be due to depression, aging, attention deficit disorder.  Felt neurodegeneration is less likely, can follow-up in 1 to 2 years. MRI of brain has been essentially normal for age.   MMSE 29/30 today. Is emotional today.  Depression: Recommended to see therapist from Dr. Jackquline. [facility]. Has been taken off Cymbalta ; now just on Lamictal  and Wellbutrin . Is  going through grief, family stress.  RLS: She stopped Requip , switched to gabapentin  for RLS. Transition was rough, afterwards, zero restless leg symptoms. On gabapentin  300 mg qhs.  CPAP: uses faithfully every night, needs more supplies, uses machine with chip   Gait, balance:  much better off requip , looking at joining gym, not falling  Hallucinations: better since of Requip   HISTORY 12/26/2019 Dr. Jenel: Kimberly Ray is a 73 year old right-handed white female with a history of diabetes and restless leg syndrome.  Restless leg syndrome runs in her family, her father and her brother have/had similar issues.  Over the last 5 or 6 years, she has noted some mild changes in memory.  She has had occasional events which she cannot figure out how things work, she may have some difficulty with remembering recent events or remembering names for people.  She over the last year has had occasional events where she may smell of tobacco smoke when there is none.  She has had events where she may see a vague object in her right peripheral vision but when she looks she sees nothing.  She sometimes thinks it is a mouse or a person, but she never actually sees the animal or person, she just has a sensation that that is what it is.  The patient is on Requip  taking 5 mg at night, she has not had any recent increase in her dosing.  She has been placed on Seroquel .  The patient has also noted some change in balance, she will stumble on occasion, she fell last summer and fractured the left ankle.  She denies any numbness in her feet associated with her diabetes.  She has occasional headaches, she denies significant issues controlling the bowels or the bladder, she occasionally will have stress incontinence of the bladder.  She claims that her father also had memory problems before he died.  Occasionally when she stands up she may have a bit of a tremor involving the legs, occasionally a jaw tremor or tremor involving the left shoulder.  Currently, the patient is able to keep up with her medications and appointments but she is now using a pill dispenser.  She is able to operate a motor vehicle, she denies any issues getting lost.  She is sent to this office for further evaluation.  REVIEW OF SYSTEMS: Out of a  complete 14 system review of symptoms, the patient complains only of the following symptoms, and all other reviewed systems are negative.  See HPI  ALLERGIES: Allergies  Allergen Reactions   Erythromycin Other (See Comments)    TBS   Iodinated Contrast Media Other (See Comments)    Hot flashes, itching scalp Hot flashes, itching scalp   Penicillins Hives   Clarithromycin Other (See Comments)    Itching ankles   Brintellix [Vortioxetine ]     Itching   Cholestatin     HOME MEDICATIONS: Outpatient Medications Prior to Visit  Medication Sig Dispense Refill   acetaminophen  (TYLENOL ) 650 MG CR tablet Take 650 mg by mouth every 8 (eight) hours as needed for pain.     alendronate (FOSAMAX) 70 MG tablet Take 70 mg by mouth once a week.     buPROPion  (WELLBUTRIN  XL) 300 MG 24 hr tablet Take 1 tablet Daily for Mood, Focus & Concentration. 90 tablet 3   cholecalciferol (VITAMIN D3) 25 MCG (1000 UNIT) tablet Take 4,000 Units by mouth daily.     Cyanocobalamin  2500 MCG SUBL Place 1 tablet (2,500 mcg total) under the tongue  daily.     Evolocumab  (REPATHA ) 140 MG/ML SOSY Inject 140 mg into the skin every 14 (fourteen) days. 2.1 mL 12   FLUoxetine  (PROZAC ) 40 MG capsule Takes 1 capsule Daily for Mood 90 capsule 1   fluticasone  (FLONASE ) 50 MCG/ACT nasal spray 2 prays each nostril once a day (Patient taking differently: 2 sprays as needed. 2 prays each nostril once a day) 16 g 2   lamoTRIgine  (LAMICTAL ) 150 MG tablet Take  1 tablet  Daily  for  Mood                          /                                                      Take                               by                          mouth 90 tablet 3   ondansetron  (ZOFRAN -ODT) 8 MG disintegrating tablet DISSOLVE 1 TABLET ON THE  TONGUE EVERY 8 HOURS AS  NEEDED FOR NAUSEA AND  VOMITING 60 tablet 0   oxybutynin (DITROPAN-XL) 10 MG 24 hr tablet Take 10 mg by mouth daily.     Oxymetazoline HCl (NASAL SPRAY NA) Place into the nose.      pantoprazole  (PROTONIX ) 40 MG tablet Take  1 tablet 2 x / day  to Prevent Heartburn &  Indigestion 180 tablet 3   SUMAtriptan  (IMITREX ) 25 MG tablet TAKE 1 TO 2 TABLETS BY MOUTH WITH ONSET OF MIGRAINE. MAY REPEAT IN 2 HOURS IF HEADACHE PERSISTS. MAX OF 4 TABLETS IN 24 HOURS 30 tablet 0   albuterol  (VENTOLIN  HFA) 108 (90 Base) MCG/ACT inhaler Inhale 2 puffs into the lungs every 6 (six) hours as needed for wheezing or shortness of breath. (Patient not taking: Reported on 09/01/2023) 8 g 2   fluticasone -salmeterol (ADVAIR DISKUS) 250-50 MCG/ACT AEPB Inhale 1 puff into the lungs in the morning and at bedtime. (Patient not taking: Reported on 09/01/2023) 60 each 2   sodium chloride  (OCEAN) 0.65 % nasal spray Place into the nose.     No facility-administered medications prior to visit.    PAST MEDICAL HISTORY: Past Medical History:  Diagnosis Date   Adnexal mass 04/08/2020   Anemia    Depression    Extrinsic asthma 11/09/2014   Gait abnormality 12/26/2019   GERD (gastroesophageal reflux disease)    Hay fever    Head trauma    IBS (irritable bowel syndrome)    MCI (mild cognitive impairment) with memory loss    Memory loss    Migraines    OSA on CPAP    Reflux    RLS (restless legs syndrome)    Sinus congestion     PAST SURGICAL HISTORY: Past Surgical History:  Procedure Laterality Date   ABDOMINAL HYSTERECTOMY     BREAST BIOPSY Right 02/27/2014   Stereo- Benign   CHOLECYSTECTOMY  11/03/2017   Dr. Rubin   LAPAROSCOPIC HYSTERECTOMY  12/2017   NASAL SINUS SURGERY     ORIF ANKLE FRACTURE BIMALLEOLAR Left 03/14/2019   [facility],  Dr. Irene   perforated eardrum     left side   TONSILLECTOMY AND ADENOIDECTOMY     URETEROSCOPY     for ureteral stone    FAMILY HISTORY: Family History  Problem Relation Age of Onset   Diabetes Mother    Heart failure Father    Dementia Father    Mood Disorder Father    Anxiety disorder Father    Diabetes Sister    Anxiety disorder Sister     Depression Sister    Breast cancer Maternal Grandmother 33   Breast cancer Maternal Aunt 45   Breast cancer Cousin    Osteoporosis Brother    Bipolar disorder Brother    Anxiety disorder Brother    Heart failure Maternal Grandfather 32   Colon cancer Paternal Grandmother 67   Mental illness Paternal Grandmother    Stomach cancer Maternal Aunt     SOCIAL HISTORY: Social History   Socioeconomic History   Marital status: Divorced    Spouse name: Not on file   Number of children: 2   Years of education: 13   Highest education level: Not on file  Occupational History    Employer: [employer]    Comment: Flight Attendant  Tobacco Use   Smoking status: Never   Smokeless tobacco: Never  Vaping Use   Vaping status: Never Used  Substance and Sexual Activity   Alcohol use: Not Currently    Alcohol/week: 0.0 - 1.0 standard drinks of alcohol    Comment: Once a year   Drug use: No   Sexual activity: Not Currently  Other Topics Concern   Not on file  Social History Narrative   Patient is single and lives at home alone. Patient is flight attendant. Patient has college education one yearRight handed.Caffeine- Rare.    Drinks no caffeine   Social Drivers of Corporate Investment Banker Strain: Low Risk  (04/29/2022)   Received from [facility], [facility]   Overall Financial Resource Strain (CARDIA)    Difficulty of Paying Living Expenses: Not very hard  Food Insecurity: No Food Insecurity (02/27/2022)   Received from [facility], [facility]   Hunger Vital Sign    Worried About Running Out of Food in the Last Year: Never true    Ran Out of Food in the Last Year: Never true  Transportation Needs: No Transportation Needs (05/02/2022)   Received from [facility], [facility]   PRAPARE - Transportation    Lack of Transportation (Medical): No    Lack of Transportation (Non-Medical): No  Physical Activity: Inactive (02/27/2022)   Received from [facility]   Exercise Vital Sign    Days of Exercise per Week: 0 days    Minutes of Exercise per Session: 0 min  Stress: No Stress Concern Present (04/29/2022)   Received from [facility], [facility] - Occupational Stress Questionnaire    Feeling of Stress : Only a little  Social Connections: Unknown (03/12/2023)   Received from [facility]   Social Network    Social Network: Not on file  Intimate Partner Violence: Unknown (03/12/2023)   Received from [facility]   HITS    Physically Hurt: Not on file    Insult or Talk Down To: Not on file    Threaten Physical Harm: Not on file    Scream or Curse: Not on file   PHYSICAL EXAM  Vitals:   09/01/23 1334  BP: 116/68  Pulse: 86  Weight: 180 lb (81.6 kg)  Height: 5' 6.5 (1.689 m)    Body mass index is 28.62 kg/m.  Generalized: Well developed, in no acute distress     09/02/2022    2:21 PM 08/15/2021    9:17 AM 10/25/2020    1:12 PM  MMSE - Mini Mental State Exam  Orientation to time 4 5 5   Orientation to Place 5 5 5   Registration 3 3 3   Attention/ Calculation 2 2 4   Recall 3 3 3   Language- name 2 objects 2 2 2   Language- repeat 1 1 1   Language- follow 3 step command 3 3 3   Language- read & follow direction 1 1 1   Write a sentence 1 1 1   Copy design 1 1 1   Total score 26 27 29       09/01/2023    1:38 PM 07/14/2023   11:00 AM 09/14/2020   11:00 AM  Montreal Cognitive Assessment   Visuospatial/ Executive (0/5) 5 5 2   Naming (0/3) 3 3 3   Attention: Read list of digits (0/2) 1 2 1   Attention: Read list of letters (0/1) 1 1 1   Attention: Serial 7 subtraction starting at 100 (0/3) 3 2 2   Language: Repeat phrase (0/2) 2 1 1   Language : Fluency (0/1) 1 1 0  Abstraction (0/2) 2 2 1   Delayed Recall (0/5) 5 3 3   Orientation (0/6) 5 6 6   Total 28 26 20   Adjusted Score (based on education)  26 21   Neurological examination  Mentation: Alert oriented to time, place, history  taking. Follows all commands speech and language fluent.  Very pleasant. Cranial nerve II-XII: Pupils were equal round reactive to light. Extraocular movements were full, visual field were full on confrontational test. Facial sensation and strength were normal. Head turning and shoulder shrug  were normal and symmetric. Motor: The motor testing reveals 5 over 5 strength of all 4 extremities. Good symmetric motor tone is noted throughout.   Sensory: Sensory testing is intact to soft touch on all 4 extremities. No evidence of extinction is noted.  Coordination: Cerebellar testing reveals good finger-nose-finger and heel-to-shin bilaterally.  Gait and station: Gait is normal,  Reflexes: Deep tendon reflexes are symmetric and normal bilaterally.   DIAGNOSTIC DATA (LABS, IMAGING, TESTING) - I reviewed patient records, labs, notes, testing and imaging myself where available.  Lab Results  Component Value Date   WBC 6.7 07/14/2023   HGB 12.7 07/14/2023   HCT 39.3 07/14/2023   MCV 86.1 07/14/2023   PLT 293.0 07/14/2023      Component Value Date/Time   NA 139 07/14/2023 1535   NA 141 07/17/2022 1211   K 4.1 07/14/2023 1535   CL 105 07/14/2023 1535   CO2 25 07/14/2023 1535   GLUCOSE 115 (H) 07/14/2023 1535   BUN 22 07/14/2023 1535   BUN 25 07/17/2022 1211   CREATININE 1.07 07/14/2023 1535   CREATININE 1.09 (H) 02/17/2023 1132   CALCIUM 9.1 07/14/2023 1535   PROT 7.0 07/14/2023 1535   PROT 6.6 07/17/2022 1211   ALBUMIN 4.5 07/14/2023 1535   ALBUMIN 4.4 07/17/2022 1211   AST 52 (H) 07/14/2023 1535   ALT 45 (H) 07/14/2023 1535   ALKPHOS 66 07/14/2023 1535   BILITOT 0.5 07/14/2023 1535   BILITOT 0.2 07/17/2022 1211   GFRNONAA 50 (L) 11/29/2020 1538   GFRAA 58 (L) 11/29/2020 1538   Lab Results  Component Value Date  CHOL 135 02/17/2023   HDL 61 02/17/2023   LDLCALC 51 02/17/2023   TRIG 146 02/17/2023   CHOLHDL 2.2 02/17/2023   Lab Results  Component Value Date   HGBA1C 5.5  02/17/2023   Lab Results  Component Value Date   VITAMINB12 >2,000 (H) 10/20/2022   Lab Results  Component Value Date   TSH 3.55 02/17/2023   ASSESSMENT AND PLAN 73 y.o. year old female  has a past medical history of Adnexal mass (04/08/2020), Anemia, Depression, Extrinsic asthma (11/09/2014), Gait abnormality (12/26/2019), GERD (gastroesophageal reflux disease), Hay fever, Head trauma, IBS (irritable bowel syndrome), MCI (mild cognitive impairment) with memory loss, Memory loss, Migraines, OSA on CPAP, Reflux, RLS (restless legs syndrome), and Sinus congestion. here with:  1.  Mild memory disturbance, MOCA 28/30 today -Is involved in clinical trial, reportedly had positive amyloid PET scan, LP amyloid specimen.  She will obtain the records from the clinical trial study via [employer] for my review.  She is enrolled in double-blind study and is unclear if she is receiving drug, study involving an erectile dysfunction drug for Alzheimer's.  If results are positive, unclear if she would start Aricept, Namenda, Leqembi. -Had neuropsychological evaluation with Dr. Jackquline Jan 2022, felt the issue was more related to executive control function, could be due to depression, aging, attention deficit disorder.  Felt neurodegeneration is less likely, can follow-up in 1-2 years (2024) if needed.  I placed a referral for neuropsychological evaluation, saw Camie Sevin at [facility], decided she would come back here  2.  Restless leg syndrome 3.  Visual/olfactory hallucinations -Well controlled, no longer hallucinating since Requip  d/c  4.  Gait disturbance 5.  Depression -Continue follow-up with psychiatry, on higher dose Wellbutrin  and Lamictal   6.  OSA on CPAP -Has 2 machines that she uses at her mother's house, another at her home.  Uses nightly.  She is likely eligible for a new machine in the future.  Recommend continue nightly usage in about 4 hours.  I asked her to follow-up in 6 months  with Dr. Chalice to establish care.  She previously saw Dr. Chalice for sleep related issues.  Lauraine Born, AGNP-C, DNP 09/01/2023, 1:59 PM [facility] [address], [address] [place], [place] [address] [phone]

## 2023-09-01 NOTE — Patient Instructions (Addendum)
 Try to get the results from testing (amyloid PET, lumbar puncture result, any other labs or imaging specifically for Alzheimer's) for our review. Medication options for Alzheimer's are things like Aricept, Namenda, Leqembi.

## 2023-09-25 ENCOUNTER — Encounter: Payer: Medicare (Managed Care) | Admitting: Internal Medicine

## 2023-09-28 ENCOUNTER — Other Ambulatory Visit: Payer: Self-pay | Admitting: Nurse Practitioner

## 2023-09-30 ENCOUNTER — Other Ambulatory Visit: Payer: Self-pay

## 2023-09-30 DIAGNOSIS — K219 Gastro-esophageal reflux disease without esophagitis: Secondary | ICD-10-CM

## 2023-09-30 MED ORDER — PANTOPRAZOLE SODIUM 40 MG PO TBEC: DELAYED_RELEASE_TABLET | ORAL | 0 refills | Status: DC

## 2023-10-26 ENCOUNTER — Ambulatory Visit
Admission: RE | Admit: 2023-10-26 | Discharge: 2023-10-26 | Disposition: A | Discharge status: Home / Self Care | Payer: Medicare (Managed Care) | Source: Ambulatory Visit | Attending: Nurse Practitioner

## 2023-10-26 ENCOUNTER — Ambulatory Visit (AMBULATORY_SURGERY_CENTER): Payer: Medicare (Managed Care) | Admitting: *Deleted

## 2023-10-26 VITALS — Ht 66.5 in | Wt 180.0 lb

## 2023-10-26 DIAGNOSIS — D509 Iron deficiency anemia, unspecified: Secondary | ICD-10-CM

## 2023-10-26 DIAGNOSIS — E2839 Other primary ovarian failure: Secondary | ICD-10-CM

## 2023-10-26 DIAGNOSIS — Z1211 Encounter for screening for malignant neoplasm of colon: Secondary | ICD-10-CM

## 2023-10-26 NOTE — Progress Notes (Signed)
 Pt's name and DOB verified at the beginning of the pre-visit wit 2 identifiers  Pt denies any difficulty with ambulating,sitting, laying down or rolling side to side  Pt has no issues with ambulation   Pt has no issues moving head neck or swallowing  No egg or soy allergy known to patient   No issues known to pt with past sedation with any surgeries or procedures  Pt denies having issues being intubated  No FH of Malignant Hyperthermia  Pt is not on diet pills or shots  Pt is not on home 02   Pt is not on blood thinners   Pt denies issues with constipation   Pt is not on dialysis  Pt denise any abnormal heart rhythms   Pt denies any upcoming cardiac testing  Patient's chart reviewed by Cathlyn Parsons CNRA prior to pre-visit and patient appropriate for the [facility].  Pre-visit completed and red dot placed by patient's name on their procedure day (on provider's schedule).    Visit by phone  Pt states  weight is 180 lb  IInstructions reviewed. Pt given  [facility] main # and MD on call # prior to instructions.  Pt states understanding of instructions. Instructed to review again prior to procedure. Pt states they will.

## 2023-11-03 ENCOUNTER — Ambulatory Visit: Payer: Medicare Other | Admitting: Nurse Practitioner

## 2023-11-03 ENCOUNTER — Encounter: Payer: Self-pay | Admitting: Internal Medicine

## 2023-11-08 ENCOUNTER — Telehealth: Payer: Self-pay | Admitting: Gastroenterology

## 2023-11-08 NOTE — Telephone Encounter (Signed)
 Received call overnight from patient. She is scheduled for colonoscopy on 3/24 with Dr. Rhea Belton. She was able to drink the first half of her preparation, with some difficulty. Within 30 to 45 minutes however of completion of the first half of the preparation, she vomited it all up. She has been having bowel movements. She called to decide the next steps in her evaluation and whether she could pursue colonoscopy. Asked the patient to take antiemetic (she has Zofran at home) to help with nausea acutely. I told her to stay hydrated. I told her to take antiemetic early tomorrow at least 1 to 2 hours before preparation) and also to consider starting her preparation 1 hour early to ensure that she is able to get as much of it down as possible. She understands that enemas could be administered if needed. She states that her stool is already turning yellow with small specks of stool so is hopeful that she is cleaning out. Patient would like to continue to move forward with her planned colonoscopy tomorrow. I think that is reasonable. I will forward this information to Dr. Lauro Franklin team.  Corliss Parish, MD [facility] Advanced Endoscopy Office # [phone]

## 2023-11-09 ENCOUNTER — Telehealth: Payer: Self-pay

## 2023-11-09 ENCOUNTER — Ambulatory Visit: Payer: Medicare (Managed Care) | Admitting: Internal Medicine

## 2023-11-09 ENCOUNTER — Encounter: Payer: Self-pay | Admitting: Internal Medicine

## 2023-11-09 VITALS — BP 117/63 | HR 78 | Temp 98.0°F | Resp 22 | Ht 65.0 in | Wt 180.0 lb

## 2023-11-09 DIAGNOSIS — D125 Benign neoplasm of sigmoid colon: Secondary | ICD-10-CM

## 2023-11-09 DIAGNOSIS — K635 Polyp of colon: Secondary | ICD-10-CM

## 2023-11-09 DIAGNOSIS — K3189 Other diseases of stomach and duodenum: Secondary | ICD-10-CM | POA: Diagnosis not present

## 2023-11-09 DIAGNOSIS — D123 Benign neoplasm of transverse colon: Secondary | ICD-10-CM | POA: Diagnosis not present

## 2023-11-09 DIAGNOSIS — K5730 Diverticulosis of large intestine without perforation or abscess without bleeding: Secondary | ICD-10-CM

## 2023-11-09 DIAGNOSIS — K31819 Angiodysplasia of stomach and duodenum without bleeding: Secondary | ICD-10-CM | POA: Diagnosis not present

## 2023-11-09 DIAGNOSIS — K5710 Diverticulosis of small intestine without perforation or abscess without bleeding: Secondary | ICD-10-CM | POA: Diagnosis not present

## 2023-11-09 DIAGNOSIS — D509 Iron deficiency anemia, unspecified: Secondary | ICD-10-CM

## 2023-11-09 DIAGNOSIS — K449 Diaphragmatic hernia without obstruction or gangrene: Secondary | ICD-10-CM

## 2023-11-09 DIAGNOSIS — K319 Disease of stomach and duodenum, unspecified: Secondary | ICD-10-CM | POA: Diagnosis not present

## 2023-11-09 DIAGNOSIS — Z1211 Encounter for screening for malignant neoplasm of colon: Secondary | ICD-10-CM

## 2023-11-09 MED ORDER — SODIUM CHLORIDE 0.9 % IV SOLN: 500.0000 mL | INTRAVENOUS | Status: DC

## 2023-11-09 NOTE — Progress Notes (Signed)
 GASTROENTEROLOGY PROCEDURE H&P NOTE   Primary Care Physician: Estevan Oaks, NP    Reason for Procedure:  Iron deficiency anemia, history of GERD and pill dysphagia, prior PUD  Plan:    Upper and lower endoscopy  Patient is appropriate for endoscopic procedure(s) in the ambulatory ([facility]) setting.  The nature of the procedure, as well as the risks, benefits, and alternatives were carefully and thoroughly reviewed with the patient. Ample time for discussion and questions allowed. The patient understood, was satisfied, and agreed to proceed.     HPI: Kimberly Ray is a 73 y.o. female who presents for EGD and colonoscopy.  Medical history as below.  Tolerated the prep.  No recent chest pain or shortness of breath.  No abdominal pain today.  Past Medical History:  Diagnosis Date   ADD (attention deficit disorder)    Adnexal mass 04/08/2020   Anemia    Cataract    Chronic kidney disease    Stage !!!   Depression    Extrinsic asthma 11/09/2014   Fatty liver    Gait abnormality 12/26/2019   GERD (gastroesophageal reflux disease)    Hay fever    Head trauma    Hyperlipidemia    IBS (irritable bowel syndrome)    MCI (mild cognitive impairment) with memory loss    Memory loss    Migraines    OSA on CPAP    Osteoporosis    Reflux    RLS (restless legs syndrome)    Sinus congestion    Sleep apnea     Past Surgical History:  Procedure Laterality Date   ABDOMINAL HYSTERECTOMY     BREAST BIOPSY Right 02/27/2014   Stereo- Benign   CHOLECYSTECTOMY  11/03/2017   Dr. Derrell Lolling   LAPAROSCOPIC HYSTERECTOMY  12/2017   NASAL SINUS SURGERY     ORIF ANKLE FRACTURE BIMALLEOLAR Left 03/14/2019   [facility], Dr. Katrina Stack   perforated eardrum     left side   TONSILLECTOMY AND ADENOIDECTOMY     URETEROSCOPY     for ureteral stone    Prior to Admission medications   Medication Sig Start Date End Date Taking? Authorizing Provider  buPROPion (WELLBUTRIN XL) 300 MG 24 hr  tablet Take 1 tablet Daily for Mood, Focus & Concentration. 01/08/23  Yes Lucky Cowboy, MD  FLUoxetine (PROZAC) 40 MG capsule Takes 1 capsule Daily for Mood 01/08/23  Yes Lucky Cowboy, MD  lamoTRIgine (LAMICTAL) 150 MG tablet Take  1 tablet  Daily  for  Mood                          /                                                      Take                               by                          mouth 07/24/22  Yes Lucky Cowboy, MD  ondansetron (ZOFRAN-ODT) 8 MG disintegrating tablet DISSOLVE 1 TABLET ON THE  TONGUE EVERY 8 HOURS AS  NEEDED FOR NAUSEA AND  VOMITING  07/08/21  Yes Raynelle Dick, NP  pantoprazole (PROTONIX) 40 MG tablet Take  1 tablet 2 x / day  to Prevent Heartburn &  Indigestion 09/30/23  Yes Worthy Rancher B, FNP  acetaminophen (TYLENOL) 650 MG CR tablet Take 650 mg by mouth every 8 (eight) hours as needed for pain.    [provider]  albuterol (VENTOLIN HFA) 108 (90 Base) MCG/ACT inhaler Inhale 2 puffs into the lungs every 6 (six) hours as needed for wheezing or shortness of breath. Patient not taking: Reported on 09/01/2023 08/26/22   Raynelle Dick, NP  alendronate (FOSAMAX) 70 MG tablet Take 70 mg by mouth once a week. 04/23/23   [provider]  cholecalciferol (VITAMIN D3) 25 MCG (1000 UNIT) tablet Take 4,000 Units by mouth daily.    [provider]  Cyanocobalamin 2500 MCG SUBL Place 1 tablet (2,500 mcg total) under the tongue daily. 01/08/23   Lucky Cowboy, MD  Evolocumab (REPATHA) 140 MG/ML SOSY Inject 140 mg into the skin every 14 (fourteen) days. 04/07/23   Adela Glimpse, NP  fluticasone (FLONASE) 50 MCG/ACT nasal spray 2 prays each nostril once a day Patient taking differently: 2 sprays as needed. 2 prays each nostril once a day 08/26/22   Raynelle Dick, NP  fluticasone-salmeterol (ADVAIR DISKUS) 250-50 MCG/ACT AEPB Inhale 1 puff into the lungs in the morning and at bedtime. Patient not taking: Reported on 09/01/2023 09/26/22    Martina Sinner, MD  Multiple Vitamin (MULTIVITAMIN) capsule Take 1 capsule by mouth daily.    [provider]  Na Sulfate-K Sulfate-Mg Sulfate concentrate 17.5-3.13-1.6 GM/177ML SOLN TAKE BY MOUTH AS DIRECTED 09/29/23   Arnaldo Natal, NP  oxyBUTYnin (DITROPAN) 5 MG/5ML solution Take 75 mg by mouth daily at 12 noon.    [provider]  Oxymetazoline HCl (NASAL SPRAY NA) Place into the nose.    [provider]  sodium chloride (OCEAN) 0.65 % nasal spray Place into the nose. 08/24/22 08/24/23  [provider]  SUMAtriptan (IMITREX) 25 MG tablet TAKE 1 TO 2 TABLETS BY MOUTH WITH ONSET OF MIGRAINE. MAY REPEAT IN 2 HOURS IF HEADACHE PERSISTS. MAX OF 4 TABLETS IN 24 HOURS 11/03/22   Raynelle Dick, NP    Current Outpatient Medications  Medication Sig Dispense Refill   buPROPion (WELLBUTRIN XL) 300 MG 24 hr tablet Take 1 tablet Daily for Mood, Focus & Concentration. 90 tablet 3   FLUoxetine (PROZAC) 40 MG capsule Takes 1 capsule Daily for Mood 90 capsule 1   lamoTRIgine (LAMICTAL) 150 MG tablet Take  1 tablet  Daily  for  Mood                          /                                                      Take                               by                          mouth 90 tablet 3   ondansetron (ZOFRAN-ODT) 8 MG disintegrating tablet DISSOLVE  1 TABLET ON THE  TONGUE EVERY 8 HOURS AS  NEEDED FOR NAUSEA AND  VOMITING 60 tablet 0   pantoprazole (PROTONIX) 40 MG tablet Take  1 tablet 2 x / day  to Prevent Heartburn &  Indigestion 180 tablet 0   acetaminophen (TYLENOL) 650 MG CR tablet Take 650 mg by mouth every 8 (eight) hours as needed for pain.     albuterol (VENTOLIN HFA) 108 (90 Base) MCG/ACT inhaler Inhale 2 puffs into the lungs every 6 (six) hours as needed for wheezing or shortness of breath. (Patient not taking: Reported on 09/01/2023) 8 g 2   alendronate (FOSAMAX) 70 MG tablet Take 70 mg by mouth once a week.     cholecalciferol (VITAMIN D3) 25 MCG  (1000 UNIT) tablet Take 4,000 Units by mouth daily.     Cyanocobalamin 2500 MCG SUBL Place 1 tablet (2,500 mcg total) under the tongue daily.     Evolocumab (REPATHA) 140 MG/ML SOSY Inject 140 mg into the skin every 14 (fourteen) days. 2.1 mL 12   fluticasone (FLONASE) 50 MCG/ACT nasal spray 2 prays each nostril once a day (Patient taking differently: 2 sprays as needed. 2 prays each nostril once a day) 16 g 2   fluticasone-salmeterol (ADVAIR DISKUS) 250-50 MCG/ACT AEPB Inhale 1 puff into the lungs in the morning and at bedtime. (Patient not taking: Reported on 09/01/2023) 60 each 2   Multiple Vitamin (MULTIVITAMIN) capsule Take 1 capsule by mouth daily.     Na Sulfate-K Sulfate-Mg Sulfate concentrate 17.5-3.13-1.6 GM/177ML SOLN TAKE BY MOUTH AS DIRECTED 354 mL 0   oxyBUTYnin (DITROPAN) 5 MG/5ML solution Take 75 mg by mouth daily at 12 noon.     Oxymetazoline HCl (NASAL SPRAY NA) Place into the nose.     sodium chloride (OCEAN) 0.65 % nasal spray Place into the nose.     SUMAtriptan (IMITREX) 25 MG tablet TAKE 1 TO 2 TABLETS BY MOUTH WITH ONSET OF MIGRAINE. MAY REPEAT IN 2 HOURS IF HEADACHE PERSISTS. MAX OF 4 TABLETS IN 24 HOURS 30 tablet 0   Current Facility-Administered Medications  Medication Dose Route Frequency Provider Last Rate Last Admin   0.9 %  sodium chloride infusion  500 mL Intravenous Continuous Jauan Wohl, Carie Caddy, MD        Allergies as of 11/09/2023 - Review Complete 11/09/2023  Allergen Reaction Noted   Erythromycin Other (See Comments) 09/23/2011   Iodinated contrast media Other (See Comments) 09/23/2011   Penicillins Hives and Other (See Comments) 09/23/2011   Clarithromycin Other (See Comments) 09/23/2011   Brintellix [vortioxetine] Other (See Comments) 05/08/2014   Cholestatin Other (See Comments) 09/12/2021    Family History  Problem Relation Age of Onset   Diabetes Mother    Heart failure Father    Dementia Father    Mood Disorder Father    Anxiety disorder Father     Diabetes Sister    Anxiety disorder Sister    Depression Sister    Osteoporosis Brother    Bipolar disorder Brother    Anxiety disorder Brother    Breast cancer Maternal Aunt 45   Stomach cancer Maternal Aunt    Breast cancer Maternal Grandmother 76   Heart failure Maternal Grandfather 76   Colon cancer Paternal Grandmother 11   Mental illness Paternal Grandmother    Breast cancer Cousin    Colon polyps Neg Hx    Esophageal cancer Neg Hx    Rectal cancer Neg Hx     Social History  Socioeconomic History   Marital status: Divorced    Spouse name: Not on file   Number of children: 2   Years of education: 13   Highest education level: Not on file  Occupational History    Employer: [employer]    Comment: Flight Attendant  Tobacco Use   Smoking status: Never   Smokeless tobacco: Never  Vaping Use   Vaping status: Never Used  Substance and Sexual Activity   Alcohol use: Yes    Alcohol/week: 0.0 - 1.0 standard drinks of alcohol    Comment: Once a year   Drug use: No   Sexual activity: Not Currently  Other Topics Concern   Not on file  Social History Narrative   Patient is single and lives at home alone. Patient is flight attendant. Patient has college education one yearRight handed.Caffeine- Rare.    Drinks no caffeine   Social Drivers of Corporate investment banker Strain: Low Risk  (04/29/2022)   Received from [facility], [facility]   Overall Financial Resource Strain (CARDIA)    Difficulty of Paying Living Expenses: Not very hard  Food Insecurity: No Food Insecurity (02/27/2022)   Received from [facility], [facility]   Hunger Vital Sign    Worried About Running Out of Food in the Last Year: Never true    Ran Out of Food in the Last Year: Never true  Transportation Needs: No Transportation Needs (05/02/2022)   Received from [facility], [facility]   PRAPARE - Transportation    Lack of Transportation (Medical): No    Lack of Transportation  (Non-Medical): No  Physical Activity: Inactive (02/27/2022)   Received from [facility], [facility]   Exercise Vital Sign    Days of Exercise per Week: 0 days    Minutes of Exercise per Session: 0 min  Stress: No Stress Concern Present (04/29/2022)   Received from [facility], [facility] - Occupational Stress Questionnaire    Feeling of Stress : Only a little  Social Connections: Unknown (03/12/2023)   Received from [facility]   Social Network    Social Network: Not on file  Intimate Partner Violence: Unknown (03/12/2023)   Received from [facility]   HITS    Physically Hurt: Not on file    Insult or Talk Down To: Not on file    Threaten Physical Harm: Not on file    Scream or Curse: Not on file    Physical Exam: Vital signs in last 24 hours: @BP  119/64   Pulse 85   Temp 98 F (36.7 C)   Ht 5\' 5"  (1.651 m)   Wt 180 lb (81.6 kg)   SpO2 96%   BMI 29.95 kg/m  GEN: NAD EYE: Sclerae anicteric ENT: MMM CV: Non-tachycardic Pulm: CTA b/l GI: Soft, NT/ND NEURO:  Alert & Oriented x 3   Erick Blinks, MD [facility]  11/09/2023 2:31 PM

## 2023-11-09 NOTE — Telephone Encounter (Signed)
 Contacted pt.  She took zofran  last night and this morning and has not had any nausea or vomiting.  She has started her prep and it is going  fine with no issues.

## 2023-11-09 NOTE — Patient Instructions (Addendum)
 Please read handouts provided. Continue present medications. Await pathology results. Resume previous diet. Replace iron.  YOU HAD AN ENDOSCOPIC PROCEDURE TODAY AT THE [facility]:   Refer to the procedure report that was given to you for any specific questions about what was found during the examination.  If the procedure report does not answer your questions, please call your gastroenterologist to clarify.  If you requested that your care partner not be given the details of your procedure findings, then the procedure report has been included in a sealed envelope for you to review at your convenience later.  YOU SHOULD EXPECT: Some feelings of bloating in the abdomen. Passage of more gas than usual.  Walking can help get rid of the air that was put into your GI tract during the procedure and reduce the bloating. If you had a lower endoscopy (such as a colonoscopy or flexible sigmoidoscopy) you may notice spotting of blood in your stool or on the toilet paper. If you underwent a bowel prep for your procedure, you may not have a normal bowel movement for a few days.  Please Note:  You might notice some irritation and congestion in your nose or some drainage.  This is from the oxygen used during your procedure.  There is no need for concern and it should clear up in a day or so.  SYMPTOMS TO REPORT IMMEDIATELY:  Following lower endoscopy (colonoscopy or flexible sigmoidoscopy):  Excessive amounts of blood in the stool  Significant tenderness or worsening of abdominal pains  Swelling of the abdomen that is new, acute  Fever of 100F or higher  Following upper endoscopy (EGD)  Vomiting of blood or coffee ground material  New chest pain or pain under the shoulder blades  Painful or persistently difficult swallowing  New shortness of breath  Fever of 100F or higher  Black, tarry-looking stools  For urgent or emergent issues, a gastroenterologist can be reached at any hour by  calling [phone]. Do not use MyChart messaging for urgent concerns.    DIET:  We do recommend a small meal at first, but then you may proceed to your regular diet.  Drink plenty of fluids but you should avoid alcoholic beverages for 24 hours.  ACTIVITY:  You should plan to take it easy for the rest of today and you should NOT DRIVE or use heavy machinery until tomorrow (because of the sedation medicines used during the test).    FOLLOW UP: Our staff will call the number listed on your records the next business day following your procedure.  We will call around 7:15- 8:00 am to check on you and address any questions or concerns that you may have regarding the information given to you following your procedure. If we do not reach you, we will leave a message.     If any biopsies were taken you will be contacted by phone or by letter within the next 1-3 weeks.  Please call us at [phone] if you have not heard about the biopsies in 3 weeks.    SIGNATURES/CONFIDENTIALITY: You and/or your care partner have signed paperwork which will be entered into your electronic medical record.  These signatures attest to the fact that that the information above on your After Visit Summary has been reviewed and is understood.  Full responsibility of the confidentiality of this discharge information lies with you and/or your care-partner.

## 2023-11-09 NOTE — Op Note (Signed)
 [facility] Patient Name: Kimberly Ray Procedure Date: 11/09/2023 2:30 PM MRN: [id number] Endoscopist: Beverley Fiedler , MD, [id number] Age: 73 Referring MD:  Date of Birth: 10-Jul-1951 Gender: Female Account #: [id number] Procedure:                Colonoscopy Indications:              Iron deficiency anemia Medicines:                Monitored Anesthesia Care Procedure:                Pre-Anesthesia Assessment:                           - Prior to the procedure, a History and Physical                            was performed, and patient medications and                            allergies were reviewed. The patient's tolerance of                            previous anesthesia was also reviewed. The risks                            and benefits of the procedure and the sedation                            options and risks were discussed with the patient.                            All questions were answered, and informed consent                            was obtained. Prior Anticoagulants: The patient has                            taken no anticoagulant or antiplatelet agents. ASA                            Grade Assessment: III - A patient with severe                            systemic disease. After reviewing the risks and                            benefits, the patient was deemed in satisfactory                            condition to undergo the procedure.                           After obtaining informed consent, the colonoscope  was passed under direct vision. Throughout the                            procedure, the patient's blood pressure, pulse, and                            oxygen saturations were monitored continuously. The                            CF [id number] #[id number] was introduced through the anus                            and advanced to the terminal ileum. The colonoscopy                            was performed without  difficulty. The patient                            tolerated the procedure well. The quality of the                            bowel preparation was good. The terminal ileum,                            ileocecal valve, appendiceal orifice, and rectum                            were photographed. Scope In: 2:47:15 PM Scope Out: 3:03:05 PM Scope Withdrawal Time: 0 hours 10 minutes 32 seconds  Total Procedure Duration: 0 hours 15 minutes 50 seconds  Findings:                 The digital rectal exam was normal.                           The distal ileum contained a single small                            diverticulum.                           The terminal ileum appeared normal.                           A 3 mm polyp was found in the transverse colon. The                            polyp was sessile. The polyp was removed with a                            cold snare. Resection and retrieval were complete.                           A 3 mm polyp was found in the sigmoid colon. The  polyp was sessile. The polyp was removed with a                            cold snare. Resection and retrieval were complete.                           Multiple large-mouthed, medium-mouthed and                            small-mouthed diverticula were found in the sigmoid                            colon, descending colon, transverse colon and                            hepatic flexure.                           The retroflexed view of the distal rectum and anal                            verge was normal and showed no anal or rectal                            abnormalities. Complications:            No immediate complications. Estimated Blood Loss:     Estimated blood loss: none. Impression:               - Ileal diverticulum.                           - The examined portion of the ileum was otherwise                            normal.                           - One 3 mm polyp in the  transverse colon, removed                            with a cold snare. Resected and retrieved.                           - One 3 mm polyp in the sigmoid colon, removed with                            a cold snare. Resected and retrieved.                           - Moderate diverticulosis in the sigmoid colon, in                            the descending colon, in the transverse colon and  at the hepatic flexure.                           - The distal rectum and anal verge are normal on                            retroflexion view. Recommendation:           - Patient has a contact number available for                            emergencies. The signs and symptoms of potential                            delayed complications were discussed with the                            patient. Return to normal activities tomorrow.                            Written discharge instructions were provided to the                            patient.                           - Resume previous diet.                           - Continue present medications.                           - Await pathology results.                           - See EGD report.                           - Small bowel angioectasias most likely explain low                            iron.                           - Repeat colonoscopy may be recommended for                            surveillance (versus discontinuation based on age                            at next screening interval). The colonoscopy date                            will be determined after pathology results from                            today's exam become available for review. Vonna Kotyk  Nyra Capes, MD 11/09/2023 3:11:17 PM This report has been signed electronically.

## 2023-11-09 NOTE — Progress Notes (Signed)
 Called to room to assist during endoscopic procedure.  Patient ID and intended procedure confirmed with present staff. Received instructions for my participation in the procedure from the performing physician.

## 2023-11-09 NOTE — Telephone Encounter (Signed)
-----   Message from Carie Caddy Pyrtle sent at 11/09/2023  3:20 PM EDT ----- 2 months for labs as below Cbc, ibc + ferritin for IDA Reduce panto to once daily for now Small intestine angiodysplasias JMP

## 2023-11-09 NOTE — Progress Notes (Signed)
 Report to PACU, RN, vss, BBS= Clear.

## 2023-11-09 NOTE — Op Note (Signed)
 [facility] Patient Name: Kimberly Ray Procedure Date: 11/09/2023 2:31 PM MRN: [id number] Endoscopist: Beverley Fiedler , MD, [id number] Age: 73 Referring MD:  Date of Birth: 03-Sep-1950 Gender: Female Account #: [id number] Procedure:                Upper GI endoscopy Indications:              Iron deficiency anemia, Gastro-esophageal reflux                            disease (hx of), remote PUD history Medicines:                Monitored Anesthesia Care Procedure:                Pre-Anesthesia Assessment:                           - Prior to the procedure, a History and Physical                            was performed, and patient medications and                            allergies were reviewed. The patient's tolerance of                            previous anesthesia was also reviewed. The risks                            and benefits of the procedure and the sedation                            options and risks were discussed with the patient.                            All questions were answered, and informed consent                            was obtained. Prior Anticoagulants: The patient has                            taken no anticoagulant or antiplatelet agents. ASA                            Grade Assessment: III - A patient with severe                            systemic disease. After reviewing the risks and                            benefits, the patient was deemed in satisfactory                            condition to undergo the procedure.  After obtaining informed consent, the endoscope was                            passed under direct vision. Throughout the                            procedure, the patient's blood pressure, pulse, and                            oxygen saturations were monitored continuously. The                            GIF [id number] #[id number] was introduced through the                            mouth, and advanced  to the second part of duodenum.                            The upper GI endoscopy was accomplished without                            difficulty. The patient tolerated the procedure                            well. Scope In: Scope Out: Findings:                 The examined esophagus was normal.                           A 3 cm hiatal hernia was present.                           The gastroesophageal flap valve was visualized                            endoscopically and classified as Hill Grade IV (no                            fold, wide open lumen, hiatal hernia present).                           The entire examined stomach was normal. Biopsies                            were taken with a cold forceps for Helicobacter                            pylori testing.                           Two diminutive angioectasias with typical                            arborization were found in the second portion of  the duodenum.                           The exam of the duodenum was otherwise normal.                           Biopsies for histology were taken with a cold                            forceps in the second portion of the duodenum for                            evaluation of celiac disease. Complications:            No immediate complications. Estimated Blood Loss:     Estimated blood loss was minimal. Impression:               - Normal esophagus.                           - 3 cm hiatal hernia.                           - Normal stomach. Biopsied.                           - Two angioectasias in the duodenum. This is the                            most likely and probable source of iron deficiency.                           - Biopsies were taken with a cold forceps for                            evaluation of celiac disease. Recommendation:           - Patient has a contact number available for                            emergencies. The signs and symptoms  of potential                            delayed complications were discussed with the                            patient. Return to normal activities tomorrow.                            Written discharge instructions were provided to the                            patient.                           - Resume previous diet.                           -  Continue present medications. Replace iron. If                            iron stores and hemoglobin not responsive to oral                            iron then outpatient hospital SBE is recommended in                            setting of duodenal angioectasias.                           - Await pathology results.                           - See the other procedure note for documentation of                            additional recommendations. Beverley Fiedler, MD 11/09/2023 3:07:40 PM This report has been signed electronically.

## 2023-11-09 NOTE — Progress Notes (Signed)
 Pt's states no medical or surgical changes since previsit or office visit.

## 2023-11-10 ENCOUNTER — Telehealth: Payer: Self-pay

## 2023-11-10 NOTE — Telephone Encounter (Signed)
  Follow up Call-     11/09/2023    1:51 PM  Call back number  Post procedure Call Back phone  # [phone]  Permission to leave phone message Yes     Patient questions:  Do you have a fever, pain , or abdominal swelling? No. Pain Score  0 *  Have you tolerated food without any problems? Yes.    Have you been able to return to your normal activities? Yes.    Do you have any questions about your discharge instructions: Diet   No. Medications  No. Follow up visit  No.  Do you have questions or concerns about your Care? No.  Actions: * If pain score is 4 or above: No action needed, pain <4.

## 2023-11-10 NOTE — Telephone Encounter (Signed)
 Informed patient I will contact her in 2 months to have her come in to the lab for repeat blood work. Orders to placed at that time. Also, verified patient is going to reduce pantoprazole to once daily dosing for now. Patient verbalized understanding.

## 2023-11-10 NOTE — Telephone Encounter (Signed)
 Called patient to go over Dr. Lauro Franklin recommendations. The phone line was a bad connection. Will attempt to contact patient later.

## 2023-11-12 ENCOUNTER — Encounter: Payer: Self-pay | Admitting: Internal Medicine

## 2023-11-12 LAB — SURGICAL PATHOLOGY

## 2024-01-01 ENCOUNTER — Emergency Department (HOSPITAL_COMMUNITY)
Admission: EM | Admit: 2024-01-01 | Discharge: 2024-01-02 | Disposition: A | Discharge status: Home / Self Care | Payer: Medicare (Managed Care) | Attending: Emergency Medicine | Admitting: Emergency Medicine

## 2024-01-01 ENCOUNTER — Emergency Department (HOSPITAL_COMMUNITY): Payer: Medicare (Managed Care)

## 2024-01-01 ENCOUNTER — Encounter (HOSPITAL_COMMUNITY): Payer: Self-pay | Admitting: *Deleted

## 2024-01-01 ENCOUNTER — Other Ambulatory Visit: Payer: Self-pay

## 2024-01-01 DIAGNOSIS — R197 Diarrhea, unspecified: Secondary | ICD-10-CM | POA: Insufficient documentation

## 2024-01-01 DIAGNOSIS — R531 Weakness: Secondary | ICD-10-CM | POA: Diagnosis present

## 2024-01-01 DIAGNOSIS — R0602 Shortness of breath: Secondary | ICD-10-CM | POA: Insufficient documentation

## 2024-01-01 DIAGNOSIS — R55 Syncope and collapse: Secondary | ICD-10-CM | POA: Diagnosis not present

## 2024-01-01 DIAGNOSIS — R059 Cough, unspecified: Secondary | ICD-10-CM | POA: Insufficient documentation

## 2024-01-01 DIAGNOSIS — E860 Dehydration: Secondary | ICD-10-CM | POA: Insufficient documentation

## 2024-01-01 LAB — TROPONIN I (HIGH SENSITIVITY): Troponin I (High Sensitivity): 10 ng/L (ref <18)

## 2024-01-01 LAB — CBC
HCT: 38.5 % (ref 36.0–46.0)
Hemoglobin: 12.3 g/dL (ref 12.0–15.0)
MCH: 27.7 pg (ref 26.0–34.0)
MCHC: 31.9 g/dL (ref 30.0–36.0)
MCV: 86.7 fL (ref 80.0–100.0)
Platelets: 295 10*3/uL (ref 150–400)
RBC: 4.44 MIL/uL (ref 3.87–5.11)
RDW: 13.4 % (ref 11.5–15.5)
WBC: 8.6 10*3/uL (ref 4.0–10.5)
nRBC: 0 % (ref 0.0–0.2)

## 2024-01-01 MED ORDER — SODIUM CHLORIDE 0.9 % IV BOLUS
500.0000 mL | Freq: Once | INTRAVENOUS | Status: AC
  Administered 2024-01-02: 500 mL via INTRAVENOUS

## 2024-01-01 NOTE — ED Provider Notes (Signed)
 [place] EMERGENCY DEPARTMENT AT [facility] Provider Note   CSN: [id number] Arrival date & time: 01/01/24  2058     History  Chief Complaint  Patient presents with   Nausea   Shortness of Breath    Kimberly Ray is a 73 y.o. female.  Patient presents to the emergency department for evaluation of weakness, cough.  Patient reports that she has been sick for several days.  Cough has been more prominent in the mornings, cough medicine has helped.  She has had some mild nausea today.  After dinner tonight she had sensation of needing to have an intermittent bowel movement, had some loose stools.  She noticed that she was feeling increased weakness, felt like she was going to pass out when she walked.       Home Medications Prior to Admission medications   Medication Sig Start Date End Date Taking? Authorizing Provider  acetaminophen  (TYLENOL ) 650 MG CR tablet Take 650 mg by mouth every 8 (eight) hours as needed for pain.    [provider]  albuterol  (VENTOLIN  HFA) 108 (90 Base) MCG/ACT inhaler Inhale 2 puffs into the lungs every 6 (six) hours as needed for wheezing or shortness of breath. Patient not taking: Reported on 09/01/2023 08/26/22   Wilkinson, Dana E, FNP  alendronate (FOSAMAX) 70 MG tablet Take 70 mg by mouth once a week. 04/23/23   [provider]  buPROPion  (WELLBUTRIN  XL) 300 MG 24 hr tablet Take 1 tablet Daily for Mood, Focus & Concentration. 01/08/23   Vangie Genet, MD  cholecalciferol (VITAMIN D3) 25 MCG (1000 UNIT) tablet Take 4,000 Units by mouth daily.    [provider]  Cyanocobalamin  2500 MCG SUBL Place 1 tablet (2,500 mcg total) under the tongue daily. 01/08/23   Vangie Genet, MD  Evolocumab  (REPATHA ) 140 MG/ML SOSY Inject 140 mg into the skin every 14 (fourteen) days. 04/07/23   Cranford, Tonya, NP  FLUoxetine  (PROZAC ) 40 MG capsule Takes 1 capsule Daily for Mood 01/08/23   Vangie Genet, MD  fluticasone   (FLONASE ) 50 MCG/ACT nasal spray 2 prays each nostril once a day Patient taking differently: 2 sprays as needed. 2 prays each nostril once a day 08/26/22   Wilkinson, Dana E, FNP  fluticasone -salmeterol (ADVAIR DISKUS) 250-50 MCG/ACT AEPB Inhale 1 puff into the lungs in the morning and at bedtime. Patient not taking: Reported on 09/01/2023 09/26/22   Wilfredo Hanly, MD  lamoTRIgine  (LAMICTAL ) 150 MG tablet Take  1 tablet  Daily  for  Mood                          /                                                      Take                               by                          mouth 07/24/22   Vangie Genet, MD  Multiple Vitamin (MULTIVITAMIN) capsule Take 1 capsule by mouth daily.    [provider]  Na Sulfate-K Sulfate-Mg Sulfate concentrate  17.5-3.13-1.6 GM/177ML SOLN TAKE BY MOUTH AS DIRECTED 09/29/23   Tory Freiberg, NP  ondansetron  (ZOFRAN -ODT) 8 MG disintegrating tablet DISSOLVE 1 TABLET ON THE  TONGUE EVERY 8 HOURS AS  NEEDED FOR NAUSEA AND  VOMITING 07/08/21   Wilkinson, Dana E, FNP  oxyBUTYnin (DITROPAN) 5 MG/5ML solution Take 75 mg by mouth daily at 12 noon.    [provider]  Oxymetazoline HCl (NASAL SPRAY NA) Place into the nose.    [provider]  pantoprazole  (PROTONIX ) 40 MG tablet Take  1 tablet 2 x / day  to Prevent Heartburn &  Indigestion 09/30/23   Webb, Padonda B, FNP  sodium chloride  (OCEAN) 0.65 % nasal spray Place into the nose. 08/24/22 08/24/23  [provider]  SUMAtriptan  (IMITREX ) 25 MG tablet TAKE 1 TO 2 TABLETS BY MOUTH WITH ONSET OF MIGRAINE. MAY REPEAT IN 2 HOURS IF HEADACHE PERSISTS. MAX OF 4 TABLETS IN 24 HOURS 11/03/22   Wilkinson, Dana E, FNP      Allergies    Erythromycin, Iodinated contrast media, Penicillins, Clarithromycin, Brintellix [vortioxetine ], and Cholestatin    Review of Systems   Review of Systems  Physical Exam Updated Vital Signs BP 121/67   Pulse 64   Temp 98.4 F (36.9 C)   Resp 16   Ht 5'  5" (1.651 m)   Wt 81.6 kg   SpO2 96%   BMI 29.94 kg/m  Physical Exam Vitals and nursing note reviewed.  Constitutional:      General: She is not in acute distress.    Appearance: She is well-developed.  HENT:     Head: Normocephalic and atraumatic.     Mouth/Throat:     Mouth: Mucous membranes are moist.  Eyes:     General: Vision grossly intact. Gaze aligned appropriately.     Extraocular Movements: Extraocular movements intact.     Conjunctiva/sclera: Conjunctivae normal.  Cardiovascular:     Rate and Rhythm: Normal rate and regular rhythm.     Pulses: Normal pulses.     Heart sounds: Normal heart sounds, S1 normal and S2 normal. No murmur heard.    No friction rub. No gallop.  Pulmonary:     Effort: Pulmonary effort is normal. No respiratory distress.     Breath sounds: Normal breath sounds.  Abdominal:     General: Bowel sounds are normal.     Palpations: Abdomen is soft.     Tenderness: There is no abdominal tenderness. There is no guarding or rebound.     Hernia: No hernia is present.  Musculoskeletal:        General: No swelling.     Cervical back: Full passive range of motion without pain, normal range of motion and neck supple. No spinous process tenderness or muscular tenderness. Normal range of motion.     Right lower leg: No edema.     Left lower leg: No edema.  Skin:    General: Skin is warm and dry.     Capillary Refill: Capillary refill takes less than 2 seconds.     Findings: No ecchymosis, erythema, rash or wound.  Neurological:     General: No focal deficit present.     Mental Status: She is alert and oriented to person, place, and time.     GCS: GCS eye subscore is 4. GCS verbal subscore is 5. GCS motor subscore is 6.     Cranial Nerves: Cranial nerves 2-12 are intact.     Sensory: Sensation is  intact.     Motor: Motor function is intact.     Coordination: Coordination is intact.  Psychiatric:        Attention and Perception: Attention normal.         Mood and Affect: Mood normal.        Speech: Speech normal.        Behavior: Behavior normal.     ED Results / Procedures / Treatments   Labs (all labs ordered are listed, but only abnormal results are displayed) Labs Reviewed  BASIC METABOLIC PANEL WITH GFR - Abnormal; Notable for the following components:      Result Value   Potassium 3.3 (*)    BUN 27 (*)    All other components within normal limits  RESP PANEL BY RT-PCR (RSV, FLU A&B, COVID)  RVPGX2  CBC  BRAIN NATRIURETIC PEPTIDE  URINALYSIS, W/ REFLEX TO CULTURE (INFECTION SUSPECTED)  TROPONIN I (HIGH SENSITIVITY)  TROPONIN I (HIGH SENSITIVITY)    EKG EKG Interpretation Date/Time:  Friday Jan 01 2024 22:10:50 EDT Ventricular Rate:  73 PR Interval:  144 QRS Duration:  94 QT Interval:  410 QTC Calculation: 451 R Axis:   19  Text Interpretation: Normal sinus rhythm Normal ECG When compared with ECG of 29-Dec-2004 14:18, PREVIOUS ECG IS PRESENT Confirmed by Ballard Bongo [phone]) on 01/02/2024 1:26:20 AM  Radiology DG Chest 2 View Result Date: 01/01/2024 CLINICAL DATA:  Chest pain short of breath EXAM: CHEST - 2 VIEW COMPARISON:  07/17/2022 FINDINGS: Mild chronic bronchitic changes. No acute consolidation or effusion. Stable cardiomediastinal silhouette with aortic atherosclerosis. No pneumothorax IMPRESSION: No active cardiopulmonary disease. Mild chronic bronchitic changes. Electronically Signed   By: Esmeralda Hedge M.D.   On: 01/01/2024 22:47    Procedures Procedures    Medications Ordered in ED Medications  sodium chloride  0.9 % bolus 500 mL (0 mLs Intravenous Stopped 01/02/24 0146)    ED Course/ Medical Decision Making/ A&P                                 Medical Decision Making Amount and/or Complexity of Data Reviewed Labs: ordered.   Presents to the emergency permit for evaluation of generalized weakness, mild nausea.  She did have an episode of loose stools earlier and afterwards her Apple  Watch alerted her to a heart rate above 100.  Her vital signs here, however, have been entirely normal.  Patient does not have any neurologic deficit on exam, no focal findings.  No signs of stroke.  She does report getting winded with walking.  No hypoxia.  She was able to ambulate here in the ED without difficulty.  Patient's BUN is mildly elevated consistent with mild dehydration.  She had a urinalysis performed at her doctor's office earlier today that showed ketones and bilirubin consistent with dehydration.  Urinalysis here is clear, no signs of infection.        Final Clinical Impression(s) / ED Diagnoses Final diagnoses:  Near syncope  Dehydration    Rx / DC Orders ED Discharge Orders     None         Liahm Grivas, Marine Sia, MD 01/02/24 [phone]

## 2024-01-01 NOTE — ED Notes (Signed)
 Unable to get pa in triage to see this pt

## 2024-01-01 NOTE — ED Triage Notes (Signed)
 The pt has had dizziness sob  with diarrhea and nausea  for 3-4 days

## 2024-01-02 LAB — URINALYSIS, W/ REFLEX TO CULTURE (INFECTION SUSPECTED)
Bacteria, UA: NONE SEEN
Bilirubin Urine: NEGATIVE
Glucose, UA: NEGATIVE mg/dL
Hgb urine dipstick: NEGATIVE
Ketones, ur: NEGATIVE mg/dL
Leukocytes,Ua: NEGATIVE
Nitrite: NEGATIVE
Protein, ur: NEGATIVE mg/dL
Specific Gravity, Urine: 1.015 (ref 1.005–1.030)
pH: 6 (ref 5.0–8.0)

## 2024-01-02 LAB — BASIC METABOLIC PANEL WITH GFR
Anion gap: 11 (ref 5–15)
BUN: 27 mg/dL — ABNORMAL HIGH (ref 8–23)
CO2: 22 mmol/L (ref 22–32)
Calcium: 9.1 mg/dL (ref 8.9–10.3)
Chloride: 104 mmol/L (ref 98–111)
Creatinine, Ser: 0.97 mg/dL (ref 0.44–1.00)
GFR, Estimated: >60 mL/min (ref >60)
Glucose, Bld: 94 mg/dL (ref 70–99)
Potassium: 3.3 mmol/L — ABNORMAL LOW (ref 3.5–5.1)
Sodium: 137 mmol/L (ref 135–145)

## 2024-01-02 LAB — TROPONIN I (HIGH SENSITIVITY): Troponin I (High Sensitivity): 10 ng/L (ref <18)

## 2024-01-02 LAB — RESP PANEL BY RT-PCR (RSV, FLU A&B, COVID)  RVPGX2
Influenza A by PCR: NEGATIVE
Influenza B by PCR: NEGATIVE
Resp Syncytial Virus by PCR: NEGATIVE
SARS Coronavirus 2 by RT PCR: NEGATIVE

## 2024-01-02 LAB — BRAIN NATRIURETIC PEPTIDE: B Natriuretic Peptide: 57.6 pg/mL (ref 0.0–100.0)

## 2024-01-02 NOTE — ED Notes (Signed)
 Pt ambulatory around the department. Got slightly short winded but sats maintained >93% and HR stayed 80s.

## 2024-01-06 ENCOUNTER — Telehealth: Payer: Self-pay

## 2024-01-06 ENCOUNTER — Other Ambulatory Visit: Payer: Self-pay

## 2024-01-06 DIAGNOSIS — D509 Iron deficiency anemia, unspecified: Secondary | ICD-10-CM

## 2024-01-06 NOTE — Telephone Encounter (Signed)
 Left detailed message for patient to come in for repeat labs. Will send MyChart message also. Patient is MyChart active.

## 2024-01-06 NOTE — Telephone Encounter (Signed)
-----   Message from [facility] Lavona Pounds sent at 11/10/2023  1:59 PM EDT ----- See note below. Labs have not been put in Epic. Call patient to remind her.  ----- Message from Amber Bail Pyrtle sent at 11/09/2023  3:20 PM EDT ----- 2 months for labs as below Cbc, ibc + ferritin for IDA Reduce panto to once daily for now Small intestine angiodysplasias JMP

## 2024-01-07 ENCOUNTER — Other Ambulatory Visit (INDEPENDENT_AMBULATORY_CARE_PROVIDER_SITE_OTHER): Payer: Medicare (Managed Care)

## 2024-01-07 DIAGNOSIS — D509 Iron deficiency anemia, unspecified: Secondary | ICD-10-CM

## 2024-01-07 LAB — IBC + FERRITIN
Ferritin: 37 ng/mL (ref 10.0–291.0)
Iron: 50 ug/dL (ref 42–145)
Saturation Ratios: 10.9 % — ABNORMAL LOW (ref 20.0–50.0)
TIBC: 459.2 ug/dL — ABNORMAL HIGH (ref 250.0–450.0)
Transferrin: 328 mg/dL (ref 212.0–360.0)

## 2024-01-07 LAB — CBC WITH DIFFERENTIAL/PLATELET
Basophils Absolute: 0.1 10*3/uL (ref 0.0–0.1)
Basophils Relative: 0.7 % (ref 0.0–3.0)
Eosinophils Absolute: 0.1 10*3/uL (ref 0.0–0.7)
Eosinophils Relative: 1.1 % (ref 0.0–5.0)
HCT: 38.5 % (ref 36.0–46.0)
Hemoglobin: 12.5 g/dL (ref 12.0–15.0)
Lymphocytes Relative: 22.2 % (ref 12.0–46.0)
Lymphs Abs: 1.7 10*3/uL (ref 0.7–4.0)
MCHC: 32.5 g/dL (ref 30.0–36.0)
MCV: 84.8 fl (ref 78.0–100.0)
Monocytes Absolute: 0.6 10*3/uL (ref 0.1–1.0)
Monocytes Relative: 7.8 % (ref 3.0–12.0)
Neutro Abs: 5.3 10*3/uL (ref 1.4–7.7)
Neutrophils Relative %: 68.2 % (ref 43.0–77.0)
Platelets: 294 10*3/uL (ref 150.0–400.0)
RBC: 4.54 Mil/uL (ref 3.87–5.11)
RDW: 13.8 % (ref 11.5–15.5)
WBC: 7.8 10*3/uL (ref 4.0–10.5)

## 2024-01-08 ENCOUNTER — Telehealth: Payer: Self-pay | Admitting: Internal Medicine

## 2024-01-08 NOTE — Telephone Encounter (Signed)
 Patient called and stated that she was order lab work but her insurance is requesting her to have a prior authorization for those lab order. Patient is requesting a call back. Please advise.

## 2024-01-08 NOTE — Telephone Encounter (Signed)
 Left message for patient to return my call.

## 2024-01-12 NOTE — Telephone Encounter (Signed)
 Patient states she reached out to her insurance company last week and they informed her she would need a pre-authorization prior to having her labs drawn. Patient states the lab spoke with her insurance when she came in to have her labs drawn but is unsure if they got it taken care of. Informed patient let our office know if I need to speak to someone at her insurance company regarding her labs. Patient verbalized understanding.

## 2024-01-14 ENCOUNTER — Ambulatory Visit: Payer: Self-pay | Admitting: Internal Medicine

## 2024-01-14 DIAGNOSIS — D509 Iron deficiency anemia, unspecified: Secondary | ICD-10-CM

## 2024-01-15 ENCOUNTER — Telehealth: Payer: Self-pay

## 2024-01-15 ENCOUNTER — Other Ambulatory Visit (HOSPITAL_COMMUNITY): Payer: Self-pay

## 2024-01-15 MED ORDER — INTEGRA 62.5-62.5-40-3 MG PO CAPS: 1.0000 | ORAL_CAPSULE | Freq: Every day | ORAL | 2 refills | Status: DC

## 2024-01-15 NOTE — Telephone Encounter (Signed)
 Pharmacy Patient Advocate Encounter   Received notification from CoverMyMeds that prior authorization for Integra 62.5-62.5-40-3MG  capsules is required/requested.   Insurance verification completed.   The patient is insured through [employer] .   Per test claim: This medication is excluded from the patient's benefit.

## 2024-01-15 NOTE — Telephone Encounter (Signed)
 Called patient to inform her the cost for Integra is $15. Patient states she will pay for the medication.

## 2024-01-15 NOTE — Telephone Encounter (Signed)
 Called pharmacy to find out the cost of Integra. Pharmacist states the cost would be $15.49.

## 2024-02-03 ENCOUNTER — Ambulatory Visit: Payer: Medicare Other | Admitting: Internal Medicine

## 2024-02-18 ENCOUNTER — Other Ambulatory Visit: Payer: Self-pay | Admitting: Emergency Medicine

## 2024-02-18 DIAGNOSIS — F0280 Dementia in other diseases classified elsewhere without behavioral disturbance: Secondary | ICD-10-CM

## 2024-02-29 ENCOUNTER — Ambulatory Visit (INDEPENDENT_AMBULATORY_CARE_PROVIDER_SITE_OTHER): Payer: Medicare (Managed Care) | Admitting: Neurology

## 2024-02-29 ENCOUNTER — Encounter: Payer: Self-pay | Admitting: Neurology

## 2024-02-29 ENCOUNTER — Telehealth: Payer: Self-pay | Admitting: Pharmacist

## 2024-02-29 VITALS — BP 114/68 | HR 80 | Ht 66.5 in | Wt 184.4 lb

## 2024-02-29 DIAGNOSIS — G309 Alzheimer's disease, unspecified: Secondary | ICD-10-CM

## 2024-02-29 DIAGNOSIS — G3184 Mild cognitive impairment, so stated: Secondary | ICD-10-CM

## 2024-02-29 DIAGNOSIS — G4710 Hypersomnia, unspecified: Secondary | ICD-10-CM | POA: Diagnosis not present

## 2024-02-29 MED ORDER — MODAFINIL 200 MG PO TABS: 200.0000 mg | ORAL_TABLET | Freq: Every day | ORAL | 5 refills | Status: AC

## 2024-02-29 NOTE — Telephone Encounter (Signed)
 Pharmacy Patient Advocate Encounter   Received notification from Patient Pharmacy that prior authorization for Modafinil  200MG  tablets is required/requested.   Insurance verification completed.   The patient is insured through [facility] .   Per test claim: PA required; PA submitted to above mentioned insurance via CoverMyMeds Key/confirmation #/[id number] Status is pending

## 2024-02-29 NOTE — Patient Instructions (Addendum)
 ASSESSMENT AND PLAN:  73 y.o. year old female  here with:    1) subjective memory deficits in the setting of positive Alzheimer's Biomarkers. Clinically [facility] with AD.   2) she asked about further evaluation - there should be a PETscan  for amyloid considered.  She also will sign out her research trial test results, CSF, Blood, imaging etc.   3)OSA ?still present ; ?    Plan : she is in need of implementing sleep hygiene- reports persistent hypersomnia on CPAP-  With a diagnosis of OSA she is qualified to  Former Education officer, [employer] as well. - she can take modafinil . / armodafinil.    The patient has a 80% compliance for the last 90 days by CPAP use but for the compliance of over 4 hours of daily use the number has from 30 to 66%.  So when she uses CPAP it is an average of 6-1/2 hours.  CPAP in her case is a set pressure of 7 cm water with 3 cm EPR and residual AHI is only 0.7/h.  She does have a significant air leak.  She is using a nasal pillow and this is usually associated with higher air leakage.  However apnea is well-controlled.  I like for her   Plan To repeat a HST -the machine is 74 years old she reported, choice DME,  we need a baseline and consider a different mask if apnea is still present.   She would be placed on an autotitration device.    I plan to follow up either personally or through our NP within 6 months.  By that time, I should have access to her research lab data and the results of a new HST , initiation of treatment if apnea is still present.   With a diagnosis of OSA she is qualified to take modafinil . / armodafinil.  That's helpful with driving safety.  100 mg ordered.    I would like to thank Delores Rojelio Caldron, NP and Tonita Fallow, Md [address] [place],  [place] [address] for allowing me to meet with and to take care of this pleasant patient.    Hypersomnia Hypersomnia is a condition in which a person feels very tired during the day even  though the person gets plenty of sleep at night. A person with this condition may take naps during the day and may find it very difficult to wake up from sleep. Hypersomnia may affect a person's ability to think, concentrate, drive, or remember things. What are the causes? The cause of this condition may not be known. Possible causes include: Taking certain medicines. Using drugs or alcohol. Sleep disorders, such as narcolepsy and sleep apnea. Injury to the head, brain, or spinal cord. Tumors. Certain medical conditions. These include: Depression. Diabetes. Gastroesophageal reflux disease (GERD). An underactive thyroid  gland (hypothyroidism). What are the signs or symptoms? The main symptoms of hypersomnia include: Feeling very tired throughout the day, regardless of how much sleep you got the night before. Having trouble waking up. Others may find it difficult to wake you up when you are sleeping. Sleeping for longer and longer periods at a time. Taking naps throughout the day. Other symptoms may include: Feeling restless, anxious, or annoyed. Lacking energy. Having trouble with: Remembering. Speaking. Thinking. Loss of appetite. Seeing, hearing, tasting, smelling, or feeling things that are not real (hallucinations). How is this diagnosed? This condition may be diagnosed based on: Your symptoms and medical history. Your sleeping habits. Your health  care provider may ask you to write down your sleeping habits in a daily sleep log, along with any symptoms you have. A series of tests that are done while you sleep (sleep study or polysomnogram). A test that measures how quickly you can fall asleep during the day (daytime nap study or multiple sleep latency test). How is this treated? This condition may be treated by: Following a regular sleep routine. Making lifestyle changes, such as changing your eating habits, getting regular exercise, and avoiding alcohol or caffeinated  beverages. Taking medicines to make you more alert (stimulants) during the day. Treating any underlying medical causes of hypersomnia. Follow these instructions at home: Sleep habits Stick to a routine that includes going to bed and waking up at the same times every day and night. Practice a relaxing bedtime routine. This may include reading, meditation, deep breathing, or taking a warm bath before going to sleep. Exercise regularly as told by your health care provider. However, avoid exercising in the hours right before bedtime. Keep your sleep environment at a cooler temperature, darkened, and quiet. Sleep with pillows and a mattress that are comfortable and supportive. Schedule short 20-minute naps for when you feel sleepiest during the day. Talk with your employer or teachers about your hypersomnia. If possible, adjust your schedule so that: You have a regular daytime work schedule. You can take a scheduled nap during the day. You do not have to work or be active at night. Do not eat a heavy meal for a few hours before bedtime. Eat your meals at about the same times every day. Safety  Do not drive or use machinery if you are sleepy. Ask your health care provider if it is safe for you to drive. Wear a life jacket when swimming or spending time near water. General instructions  Take over-the-counter and prescription medicines only as told by your health care provider. This includes supplements. Avoid drinking alcohol or caffeinated beverages. Keep a sleep log that will help your health care provider manage your condition. This may include information about: What time you go to bed each night. How often you wake up at night. How many hours you sleep at night. How often and for how long you nap during the day. Any observations from others, such as leg movements during sleep, sleep walking, or snoring. Keep all follow-up visits. This is important. Contact a health care provider if: You  have new symptoms. Your symptoms get worse. Get help right away if: You have thoughts about hurting yourself or someone else. Get help right away if you feel like you may hurt yourself or others, or have thoughts about taking your own life. Go to your nearest emergency room or: Call 911. Call the National Suicide Prevention Lifeline at (859) 836-6410 or 988. This is open 24 hours a day. Text the Crisis Text Line at (867)581-6017. Summary Hypersomnia refers to a condition in which you feel very tired during the day even though you get plenty of sleep at night. A person with this condition may take naps during the day and may find it very difficult to wake up from sleep. Hypersomnia may affect a person's ability to think, concentrate, drive, or remember things. Treatment may include a regular sleep routine and making some lifestyle changes. This information is not intended to replace advice given to you by your health care provider. Make sure you discuss any questions you have with your health care provider. Document Revised: 07/15/2021 Document Reviewed: 07/15/2021 Else[employer] Patient  Education  2024 [employer]Healthy Living: Sleep In this video, you will learn why sleep is an important part of a healthy lifestyle. To view the content, go to this web address: [email or web address]  This video will expire on: 07/29/2025. If you need access to this video following this date, please reach out to the healthcare provider who assigned it to you. This information is not intended to replace advice given to you by your health care provider. Make sure you discuss any questions you have with your health care provider. Elsevier Patient Education  2024 [employer].

## 2024-02-29 NOTE — Progress Notes (Signed)
 SLEEP MEDICINE CLINIC    Provider:  Dedra Gores, MD  Primary Care Physician:  Delores Rojelio Caldron, NP [address] [place] [address]     Referring Provider: Tonita Fallow, Md [address] [place],  [place] [address]          Chief Complaint according to patient   Patient presents with:     New Patient (Initial Visit)           HISTORY OF PRESENT ILLNESS:   I have not been involved in Kimberly Ray care except for a sleep study ordered by dr Jenel.  I have strictly seen her one time for Sleep medicine .   Kimberly Ray is a 73 y.o. female patient who is seen upon her request on 02/29/2024 . She is worried about her memory and has been seen for memory by [facility] 2022 and 2024, just 8 months ago .  She is enrolled in a clinical trial for cognitive impairment with [employer], had CSF for amyloid and MRI - abnormal CSF amyloid confirmed.   Chief concern according to patient :  I was a patient of Dr. Jenel and have seen Lauraine Born, NP. My first sleep study was 2014 with Dr Burnard [facility] I have seen her in 2018 for REM BD and OSA- she had mild apnea, snoring, PVCs.  She remained on CPAP, followed by Dr Jenel. Kimberly Ray WertPatricia Rigsbee Ray is a very pleasant 73 y.o. year old RH female with a history of hypertension, hyperlipidemia, RLS, IBS, anxiety, depression, OSA on CPAP, GERD, asthma, anemia, DM2, CKD, seen today for evaluation of memory loss.  In the past, she had a neuropsych evaluation in 2022 at our office, yielding a diagnosis of mild cognitive impairment although etiology was unclear, not daily due to Alzheimer's disease but suspecting to be due to depression, and ADD.   Patient was being followed by [facility] regarding memory and OSA, last seen there in January 2024 with MMSE 26/30.   She is on a double-blind clinical trial for dementia, taking the medicine for erectile dysfunction to test  efficacy . She also had other studies including LP showing A beta 1-42: A beta 1-40 ratio of 0.035, supporting the diagnosis of Alzheimer's disease ( nl >0.073).   She reports that PET amyloid  [facility] but her patient to correlate amyloid plaques but I do not have that evidence during this visit. Discussed with patient the importance of continuity of care. She is also interested in newer drugs such as Lecanemab which could be received at that facility if she were to be a candidate without interfering with the trial. Patient was appreciative of the time spent with us . We explained in detail the MRI findings from 05/01/2023, personally reviewed, remarkable for mild chronic small vessel ischemic changes within the cerebral white matter and pons, slightly progressed from the prior MRI of the brain in June 2021, but without age advanced lobar atrophy or acute intracranial abnormalities.  Patient is able to participate on his IADLs and continues to drive without significant difficulties.  MoCA today is 26/30, actually improved from prior testings (she suspects that she is actually receiving the medicine during these trials).       I have the pleasure of seeing Kimberly Ray 02/29/24 a right-handed female with a  sleep disorder, but sees me for Cognitive impairment which has  been diagnosed as Alzheimer's disease and followed elsewhere.     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  Family status is divorced , with 2 adult sons- in the area, she has 4  grandchildren.  Pets are not present. Tobacco use-none .  ETOH use : seldomly,  No Caffeine intake after  she had cystic breasts.  Exercise in form of ; no routine .   Hobbies : gardening.       Sleep habits are as follows: I can sleep too much- sleepy  feeling all the time-  slept 8 hours last night , felt great and within hours could go back to sleep, non restorative.  Has been without routines and without set bedtime. Watches sometimes TV much too long.  She takes wellbutrin  in AM , Prozac  in AM,  150 mg Lamictal  at night-  she slept better when she was just taking 100 mg at night.   The patient's dinner time is between 6-7 PM. The patient goes to bed at 8- 10 PM   and continues to sleep for 7-8 hours, wakes for 2-4  bathroom breaks, the first time at 2 AM.   The preferred sleep position is laterally but has shoulder pain- sleeps a bit more often on her back.  with the support of 2 pillows. Dreams are reportedly infrequent/vivid.   The patient wakes up spontaneously- waking up earlier  when adhering to sleep routines- sometimes 6.30 and sometimes wakes at 8.  She reports not feeling refreshed or restored after 2-3 hours of wakefulness, with symptoms such as dry mouth, rare morning headaches( she attributed these to dehydration) , and residual fatigue.  Naps are taken frequently, lasting from 2-3 hours and are more refreshing, not interfering with nocturnal sleep.    Review of Systems: Out of a complete 14 system review, the patient complains of only the following symptoms, and all other reviewed systems are negative.:  Fatigue, sleepiness , snoring,  Nocturia  Remote history of RLS.    How likely are you to doze in the following situations: 0 = not likely, 1 = slight chance, 2 = moderate chance, 3 = high chance   Sitting and Reading? Watching Television? Sitting inactive in a public place (theater or meeting)? As a passenger in a car for an hour without a break? Lying down in the afternoon when circumstances permit? Sitting and talking to someone? Sitting quietly after lunch without alcohol? In a car, while stopped for a few minutes in traffic?   Total = 20/ 24 points   FSS endorsed at NA/ 63 points.   GDS  NA  .  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 Patient is flight attendant. Patient has college education one yearRight handed.Caffeine- Rare.    Drinks no caffeine   Social Drivers of Corporate investment banker Strain: Low Risk  (04/29/2022)   Received from [facility]   Overall Financial Resource Strain (CARDIA)    Difficulty of Paying Living Expenses: Not very hard  Food Insecurity: No Food Insecurity (02/27/2022)   Received from [facility]   Hunger Vital Sign    Within the past 12 months, you worried that your food would run out before you got the money to buy more.: Never true    Within the past 12 months, the food you bought just didn't last and you didn't have money to get more.: Never true  Transportation Needs: No Transportation Needs (05/02/2022)   Received from [facility] - Transportation    Lack of Transportation (Medical): No    Lack of Transportation (Non-Medical): No  Physical Activity: Inactive (02/27/2022)   [facility] from [facility]   Exercise Vital Sign    On average, how many days per week do you engage in moderate to strenuous exercise (like a brisk walk)?: 0 days    On average, how many minutes do you engage in exercise at this level?: 0 min  Stress: No Stress Concern Present (04/29/2022)   Received from [facility] - Occupational Stress Questionnaire    Feeling of Stress : Only a little  Social Connections: Unknown (03/12/2023)   Received from [facility]   Social Network    Social Network: Not on file    Family History  Problem Relation Age of Onset   Diabetes Mother    Heart failure Father    Dementia Father    Mood Disorder Father    Anxiety disorder Father    Diabetes Sister    Anxiety disorder Sister    Depression Sister    Osteoporosis Brother    Bipolar disorder Brother    Anxiety disorder Brother    Breast cancer Maternal Aunt 45   Stomach cancer Maternal Aunt    Breast cancer Maternal Grandmother 65   Heart failure Maternal Grandfather 73   Colon cancer Paternal Grandmother 5   Mental illness Paternal Grandmother    Breast cancer Cousin    Colon polyps Neg Hx    Esophageal cancer Neg Hx    Rectal cancer Neg Hx     Past Medical History:  Diagnosis Date   ADD (attention deficit disorder)    Adnexal mass 04/08/2020   Anemia    Cataract    Chronic kidney disease    Stage !!!   Depression    Extrinsic asthma 11/09/2014   Fatty liver    Gait abnormality 12/26/2019   GERD (gastroesophageal reflux disease)    Hay fever    Head trauma    Hyperlipidemia    IBS (irritable bowel syndrome)    MCI (mild cognitive impairment) with memory loss    Memory loss    Migraines    OSA on CPAP    Osteoporosis    Reflux    RLS (restless legs syndrome)    Sinus congestion    Sleep apnea     Past Surgical History:  Procedure Laterality Date   ABDOMINAL HYSTERECTOMY     BREAST BIOPSY Right  02/27/2014   Stereo- Benign   CHOLECYSTECTOMY  11/03/2017   Dr. Rubin   LAPAROSCOPIC HYSTERECTOMY  12/2017   NASAL SINUS SURGERY     ORIF ANKLE FRACTURE BIMALLEOLAR Left 03/14/2019   [facility], Dr. Irene   perforated eardrum     left side   TONSILLECTOMY AND ADENOIDECTOMY     URETEROSCOPY     for ureteral stone     Current Outpatient Medications on File  Prior to Visit  Medication Sig Dispense Refill   alendronate (FOSAMAX) 70 MG tablet Take 70 mg by mouth once a week.     buPROPion  (WELLBUTRIN  XL) 300 MG 24 hr tablet Take 1 tablet Daily for Mood, Focus & Concentration. 90 tablet 3   cholecalciferol (VITAMIN D3) 25 MCG (1000 UNIT) tablet Take 4,000 Units by mouth daily.     Evolocumab  (REPATHA ) 140 MG/ML SOSY Inject 140 mg into the skin every 14 (fourteen) days. 2.1 mL 12   Fe Fum-FePoly-Vit C-Vit B3 (INTEGRA) 62.5-62.5-40-3 MG CAPS Take 1 tablet by mouth daily. 30 capsule 2   FLUoxetine  (PROZAC ) 40 MG capsule Takes 1 capsule Daily for Mood 90 capsule 1   lamoTRIgine  (LAMICTAL ) 150 MG tablet Take  1 tablet  Daily  for  Mood                          /                                                      Take                               by                          mouth 90 tablet 3   Multiple Vitamin (MULTIVITAMIN) capsule Take 1 capsule by mouth daily.     ondansetron  (ZOFRAN -ODT) 8 MG disintegrating tablet DISSOLVE 1 TABLET ON THE  TONGUE EVERY 8 HOURS AS  NEEDED FOR NAUSEA AND  VOMITING 60 tablet 0   pantoprazole  (PROTONIX ) 40 MG tablet Take  1 tablet 2 x / day  to Prevent Heartburn &  Indigestion 180 tablet 0   SUMAtriptan  (IMITREX ) 25 MG tablet TAKE 1 TO 2 TABLETS BY MOUTH WITH ONSET OF MIGRAINE. MAY REPEAT IN 2 HOURS IF HEADACHE PERSISTS. MAX OF 4 TABLETS IN 24 HOURS 30 tablet 0   acetaminophen  (TYLENOL ) 650 MG CR tablet Take 650 mg by mouth every 8 (eight) hours as needed for pain.     albuterol  (VENTOLIN  HFA) 108 (90 Base) MCG/ACT inhaler Inhale 2 puffs into the lungs every 6 (six) hours as needed for wheezing or shortness of breath. (Patient not taking: Reported on 09/01/2023) 8 g 2   Cyanocobalamin  2500 MCG SUBL Place 1 tablet (2,500 mcg total) under the tongue daily.     fluticasone  (FLONASE ) 50 MCG/ACT nasal spray 2 prays each nostril once a day (Patient taking differently: 2 sprays as needed. 2 prays each nostril once a day) 16 g 2    fluticasone -salmeterol (ADVAIR DISKUS) 250-50 MCG/ACT AEPB Inhale 1 puff into the lungs in the morning and at bedtime. (Patient not taking: Reported on 09/01/2023) 60 each 2  Na Sulfate-K Sulfate-Mg Sulfate concentrate 17.5-3.13-1.6 GM/177ML SOLN TAKE BY MOUTH AS DIRECTED 354 mL 0   oxyBUTYnin (DITROPAN) 5 MG/5ML solution Take 75 mg by mouth daily at 12 noon.     Oxymetazoline HCl (NASAL SPRAY NA) Place into the nose.     sodium chloride  (OCEAN) 0.65 % nasal spray Place into the nose.     No current facility-administered medications on file prior to visit.    Allergies  Allergen Reactions   Erythromycin Other (See Comments)    TBS   Iodinated Contrast Media Other (See Comments)    Hot flashes, itching scalp Hot flashes, itching scalp   Penicillins Hives and Other (See Comments)   Clarithromycin Other (See Comments)    Itching ankles   Brintellix [Vortioxetine ] Other (See Comments)    Itching   Cholestatin Other (See Comments)     DIAGNOSTIC DATA (LABS, IMAGING, TESTING) - I reviewed patient records, labs, notes, testing and imaging myself where available.  Lab Results  Component Value Date   WBC 7.8 01/07/2024   HGB 12.5 01/07/2024   HCT 38.5 01/07/2024   MCV 84.8 01/07/2024   PLT 294.0 01/07/2024      Component Value Date/Time   NA 137 01/02/2024 0021   NA 141 07/17/2022 1211   K 3.3 (L) 01/02/2024 0021   CL 104 01/02/2024 0021   CO2 22 01/02/2024 0021   GLUCOSE 94 01/02/2024 0021   BUN 27 (H) 01/02/2024 0021   BUN 25 07/17/2022 1211   CREATININE 0.97 01/02/2024 0021   CREATININE 1.09 (H) 02/17/2023 1132   CALCIUM 9.1 01/02/2024 0021   PROT 7.0 07/14/2023 1535   PROT 6.6 07/17/2022 1211   ALBUMIN 4.5 07/14/2023 1535   ALBUMIN 4.4 07/17/2022 1211   AST 52 (H) 07/14/2023 1535   ALT 45 (H) 07/14/2023 1535   ALKPHOS 66 07/14/2023 1535   BILITOT 0.5 07/14/2023 1535   BILITOT 0.2 07/17/2022 1211   GFRNONAA >60 01/02/2024 0021   GFRNONAA 50 (L) 11/29/2020 1538    GFRAA 58 (L) 11/29/2020 1538   Lab Results  Component Value Date   CHOL 135 02/17/2023   HDL 61 02/17/2023   LDLCALC 51 02/17/2023   TRIG 146 02/17/2023   CHOLHDL 2.2 02/17/2023   Lab Results  Component Value Date   HGBA1C 5.5 02/17/2023   Lab Results  Component Value Date   VITAMINB12 >2,000 (H) 10/20/2022   Lab Results  Component Value Date   TSH 3.55 02/17/2023    PHYSICAL EXAM:  Today's Vitals   02/29/24 1322  BP: 114/68  Pulse: 80  Weight: 184 lb 6.4 oz (83.6 kg)  Height: 5' 6.5 (1.689 m)   Body mass index is 29.32 kg/m.   Wt Readings from Last 3 Encounters:  02/29/24 184 lb 6.4 oz (83.6 kg)  01/01/24 179 lb 14.3 oz (81.6 kg)  11/09/23 180 lb (81.6 kg)     Ht Readings from Last 3 Encounters:  02/29/24 5' 6.5 (1.689 m)  01/01/24 5' 5 (1.651 m)  11/09/23 5' 5 (1.651 m)      General: The patient is awake, alert and appears not in acute distress. The patient is well groomed. Head: Normocephalic, atraumatic. Neck is supple. Mallampati 3,  neck circumference:14. 25  inches . Nasal airflow not fully patent.   Retrognathia is not seen.  Dental status: biological , teeth were crowded.  Cardiovascular:  Regular rate and cardiac rhythm by pulse,  without distended neck veins. Respiratory: Lungs are clear to  auscultation.  Skin:  Without evidence of ankle edema, or rash. Trunk: The patient's posture is erect.   NEUROLOGIC EXAM: The patient is awake and alert, oriented to place and time.   Memory subjective described as impaired:    09/02/2022    2:21 PM 08/15/2021    9:17 AM 10/25/2020    1:12 PM 06/27/2020   10:19 AM 12/26/2019   11:40 AM 08/04/2019    3:48 PM  MMSE - Mini Mental State Exam  Orientation to time 4 5 5 5 5 5   Orientation to Place 5 5 5 5 5 5   Registration 3 3 3 3 3 3   Attention/ Calculation 2 2 4 4 2 5   Recall 3 3 3 2 3 3   Language- name 2 objects 2 2 2 2 2 2   Language- repeat 1 1 1 1 1 1   Language- follow 3 step command 3 3 3 3 3  3   Language- read & follow direction 1 1 1 1 1 1   Write a sentence 1 1 1 1 1 1   Copy design 1 1 1 1 1 1   Total score 26 27 29 28 27 30         02/29/2024    1:35 PM 09/01/2023    1:38 PM 07/14/2023   11:00 AM 09/14/2020   11:00 AM  Montreal Cognitive Assessment   Visuospatial/ Executive (0/5) 4 5 5 2   Naming (0/3) 3 3 3 3   Attention: Read list of digits (0/2) 2 1 2 1   Attention: Read list of letters (0/1) 1 1 1 1   Attention: Serial 7 subtraction starting at 100 (0/3) 2 3 2 2   Language: Repeat phrase (0/2) 0 2 1 1   Language : Fluency (0/1) 1 1 1  0  Abstraction (0/2) 2 2 2 1   Delayed Recall (0/5) 5 5 3 3   Orientation (0/6) 6 5 6 6   Total 26 28 26 20   Adjusted Score (based on education)   26 21    Attention span & concentration ability appears normal.  Speech is fluent,  without  dysarthria, dysphonia or aphasia.  Mood and affect are appropriate.   Cranial nerves: no loss of smell or taste reported  Pupils are equal and briskly reactive to light. Funduscopic exam deferred.  Extraocular movements in vertical and horizontal planes were intact and without nystagmus. No Diplopia. Visual fields by finger perimetry are intact. Hearing was intact to soft voice and finger rubbing.   Facial sensation intact to fine touch.  Facial motor strength is symmetric and tongue and uvula move midline.  Neck ROM : rotation, tilt and flexion extension were normal for age and shoulder shrug was symmetrical.    Motor exam:  Symmetric bulk, tone and ROM.   Normal tone without cog wheeling, symmetric grip strength .   Coordination: Rapid alternating movements in the fingers/hands were of normal speed.  The Finger-to-nose maneuver was intact without evidence of ataxia, dysmetria or tremor.   Gait and station: Patient could rise unassisted from a seated position, walked without assistive device.  Stance is of normal width/ base and the patient turned with 3.5 steps.  Toe and heel walk were deferred.   Deep tendon reflexes: in the  upper and lower extremities are symmetric and intact.  Babinski response was deferred .    ASSESSMENT AND PLAN:  73 y.o. year old female  here with:    1) subjective memory deficits in the setting of positive Alzheimer's Biomarkers. Clinically MCI with  AD.   2) she asked about further evaluation - there should be a PETscan  for amyloid considered.  She also will sign out her research trial test results, CSF, Blood, imaging etc.   3)OSA ?still present ; ?    Plan : she is in need of implementing sleep hygiene- reports persistent hypersomnia on CPAP-  With a diagnosis of OSA she is qualified to  Former Education officer, museum as well. - she can take modafinil . / armodafinil.    The patient has a 80% compliance for the last 90 days by CPAP use but for the compliance of over 4 hours of daily use the number has from 30 to 66%.  So when she uses CPAP it is an average of 6-1/2 hours.  CPAP in her case is a set pressure of 7 cm water with 3 cm EPR and residual AHI is only 0.7/h.  She does have a significant air leak.  She is using a nasal pillow and this is usually associated with higher air leakage.  However apnea is well-controlled.  I like for her   Plan To repeat a HST -the machine is 74 years old she reported, choice DME,  we need a baseline and consider a different mask if apnea is still present.   She would be placed on an autotitration device.    I plan to follow up either personally or through our NP within 6 months.  By that time, I should have access to her research lab data and the results of a new HST , initiation of treatment if apnea is still present.   With a diagnosis of OSA she is qualified to take modafinil . / armodafinil.  That's helpful with driving safety.  100 mg ordered.    I would like to thank Delores Rojelio Caldron, NP and Tonita Fallow, Md [address] [place],  [place] [address] for allowing me to meet with and to take care of  this pleasant patient.    After spending a total time of  45  minutes face to face and additional time for physical and neurologic examination, review of laboratory studies,  personal review of imaging studies, reports and results of other testing and review of referral information / records as far as provided in visit,   Electronically signed by: Dedra Gores, MD 02/29/2024 1:50 PM  [facility] Board certified by The [employer] of Sleep Medicine and Diplomate of the [employer] of Sleep Medicine. Board certified In Neurology through the ABPN, Fellow of the [employer] of Neurology.

## 2024-03-01 NOTE — Telephone Encounter (Signed)
 Pharmacy Patient Advocate Encounter  Received notification from [employer] that Prior Authorization for Modafinil  200MG  tablets has been APPROVED from 02/29/2024 to 08/31/2024   PA #/Case ID/Reference #: [id number]

## 2024-03-06 ENCOUNTER — Encounter (INDEPENDENT_AMBULATORY_CARE_PROVIDER_SITE_OTHER): Payer: Self-pay

## 2024-03-07 ENCOUNTER — Ambulatory Visit (INDEPENDENT_AMBULATORY_CARE_PROVIDER_SITE_OTHER): Payer: Medicare (Managed Care) | Admitting: Family Medicine

## 2024-03-07 VITALS — BP 110/70 | HR 79 | Temp 97.8°F | Ht 66.5 in | Wt 183.2 lb

## 2024-03-07 DIAGNOSIS — Z79899 Other long term (current) drug therapy: Secondary | ICD-10-CM

## 2024-03-07 DIAGNOSIS — F319 Bipolar disorder, unspecified: Secondary | ICD-10-CM

## 2024-03-07 DIAGNOSIS — R0602 Shortness of breath: Secondary | ICD-10-CM

## 2024-03-07 DIAGNOSIS — E785 Hyperlipidemia, unspecified: Secondary | ICD-10-CM | POA: Diagnosis not present

## 2024-03-07 DIAGNOSIS — G4733 Obstructive sleep apnea (adult) (pediatric): Secondary | ICD-10-CM

## 2024-03-07 DIAGNOSIS — R002 Palpitations: Secondary | ICD-10-CM

## 2024-03-07 DIAGNOSIS — E782 Mixed hyperlipidemia: Secondary | ICD-10-CM

## 2024-03-07 DIAGNOSIS — N1831 Chronic kidney disease, stage 3a: Secondary | ICD-10-CM | POA: Diagnosis not present

## 2024-03-07 DIAGNOSIS — F339 Major depressive disorder, recurrent, unspecified: Secondary | ICD-10-CM

## 2024-03-07 DIAGNOSIS — E559 Vitamin D deficiency, unspecified: Secondary | ICD-10-CM | POA: Diagnosis not present

## 2024-03-07 DIAGNOSIS — I340 Nonrheumatic mitral (valve) insufficiency: Secondary | ICD-10-CM

## 2024-03-07 DIAGNOSIS — E538 Deficiency of other specified B group vitamins: Secondary | ICD-10-CM | POA: Diagnosis not present

## 2024-03-07 DIAGNOSIS — E1169 Type 2 diabetes mellitus with other specified complication: Secondary | ICD-10-CM | POA: Diagnosis not present

## 2024-03-07 LAB — CBC WITH DIFFERENTIAL/PLATELET
Basophils Absolute: 0 K/uL (ref 0.0–0.1)
Basophils Relative: 0.7 % (ref 0.0–3.0)
Eosinophils Absolute: 0.1 K/uL (ref 0.0–0.7)
Eosinophils Relative: 1.6 % (ref 0.0–5.0)
HCT: 40.6 % (ref 36.0–46.0)
Hemoglobin: 13.2 g/dL (ref 12.0–15.0)
Lymphocytes Relative: 20.2 % (ref 12.0–46.0)
Lymphs Abs: 1.4 K/uL (ref 0.7–4.0)
MCHC: 32.4 g/dL (ref 30.0–36.0)
MCV: 85 fl (ref 78.0–100.0)
Monocytes Absolute: 0.6 K/uL (ref 0.1–1.0)
Monocytes Relative: 8.1 % (ref 3.0–12.0)
Neutro Abs: 4.8 K/uL (ref 1.4–7.7)
Neutrophils Relative %: 69.4 % (ref 43.0–77.0)
Platelets: 311 K/uL (ref 150.0–400.0)
RBC: 4.78 Mil/uL (ref 3.87–5.11)
RDW: 14.3 % (ref 11.5–15.5)
WBC: 6.8 K/uL (ref 4.0–10.5)

## 2024-03-07 LAB — COMPREHENSIVE METABOLIC PANEL WITH GFR
ALT: 38 U/L — ABNORMAL HIGH (ref 0–35)
AST: 43 U/L — ABNORMAL HIGH (ref 0–37)
Albumin: 4.7 g/dL (ref 3.5–5.2)
Alkaline Phosphatase: 75 U/L (ref 39–117)
BUN: 32 mg/dL — ABNORMAL HIGH (ref 6–23)
CO2: 25 meq/L (ref 19–32)
Calcium: 11.4 mg/dL — ABNORMAL HIGH (ref 8.4–10.5)
Chloride: 104 meq/L (ref 96–112)
Creatinine, Ser: 0.9 mg/dL (ref 0.40–1.20)
GFR: 63.15 mL/min (ref >60.00)
Glucose, Bld: 95 mg/dL (ref 70–99)
Potassium: 4.2 meq/L (ref 3.5–5.1)
Sodium: 138 meq/L (ref 135–145)
Total Bilirubin: 0.4 mg/dL (ref 0.2–1.2)
Total Protein: 7.2 g/dL (ref 6.0–8.3)

## 2024-03-07 LAB — LIPID PANEL
Cholesterol: 146 mg/dL (ref 0–200)
HDL: 63.8 mg/dL (ref >39.00)
LDL Cholesterol: 33 mg/dL (ref 0–99)
NonHDL: 82.55
Total CHOL/HDL Ratio: 2
Triglycerides: 246 mg/dL — ABNORMAL HIGH (ref 0.0–149.0)
VLDL: 49.2 mg/dL — ABNORMAL HIGH (ref 0.0–40.0)

## 2024-03-07 LAB — MICROALBUMIN / CREATININE URINE RATIO
Creatinine,U: 128.5 mg/dL
Microalb Creat Ratio: 14.9 mg/g (ref 0.0–30.0)
Microalb, Ur: 1.9 mg/dL (ref 0.0–1.9)

## 2024-03-07 LAB — VITAMIN B12: Vitamin B-12: 513 pg/mL (ref 211–911)

## 2024-03-07 LAB — HEMOGLOBIN A1C: Hgb A1c MFr Bld: 5.8 % (ref 4.6–6.5)

## 2024-03-07 LAB — VITAMIN D 25 HYDROXY (VIT D DEFICIENCY, FRACTURES): VITD: 44.08 ng/mL (ref 30.00–100.00)

## 2024-03-07 MED ORDER — REPATHA 140 MG/ML ~~LOC~~ SOSY: 1.0000 mL | PREFILLED_SYRINGE | SUBCUTANEOUS | 12 refills | Status: DC

## 2024-03-07 MED ORDER — LAMOTRIGINE 100 MG PO TABS: 100.0000 mg | ORAL_TABLET | Freq: Every day | ORAL | 1 refills | Status: DC

## 2024-03-07 NOTE — Patient Instructions (Addendum)
 Welcome to [facility]!  Thank you for choosing us  for your Primary Care needs.   We offer in person and video appointments for your convenience. You may call our office to schedule appointments, or you may schedule appointments with me through MyChart.   The best way to get in contact with me is via MyChart message. This will get to me faster than a phone call, unless there is an emergency, then please call 911.  The lab is located downstairs in the Sports Medicine building, we also have xray available there.   Decrease lamictal  to 100mg  once daily  Continue other current medications  Follow-up with me for new or worsening symptoms.  We have made the referral to cardiology with Dr Sheena. They will be reaching out to get you scheduled.   Follow up with neuro as scheduled

## 2024-03-07 NOTE — Progress Notes (Signed)
 New Patient Visit  Subjective:     Patient ID: Kimberly Ray, female    DOB: 1950-09-05, 73 y.o.   MRN: [id number]  Chief Complaint  Patient presents with   Establish Care    HPI  Discussed the use of AI scribe software for clinical note transcription with the patient, who gave verbal consent to proceed.  History of Present Illness Kimberly Ray is a 73 year old female with palpitations and shortness of breath who presents for worsening symptoms.  Palpitations and dyspnea - Palpitations and shortness of breath present for several years - Recent worsening of symptoms - No cardiology follow-up for over a year  Hyperlipidemia management - Currently taking Repatha  for cholesterol management - Favorable laboratory results last year - Not taking Zetia   Somnolence - On Lamictal  for mood stabilization - Experiencing sleepiness, possibly as a side effect of Lamictal  - Recently started Modafinil  to address sleepiness - Full dose of Modafinil  prevents need for afternoon nap  Cognitive impairment - Participating in a clinical trial for dementia - Difficulties with technical tasks - Feels more capable on days with less fatigue  Urinary incontinence - Scheduled for E coin procedure on July 29th - Wears a diaper and pad daily - Pelvic floor exercises have been insufficient  Depressive symptoms - Stable on current medication regimen for depression - Increased socialization and time with grandson associated with improvement in mood     ROS Per HPI  Outpatient Encounter Medications as of 03/07/2024  Medication Sig   alendronate (FOSAMAX) 70 MG tablet Take 70 mg by mouth once a week.   buPROPion  (WELLBUTRIN  XL) 300 MG 24 hr tablet Take 1 tablet Daily for Mood, Focus & Concentration.   cholecalciferol (VITAMIN D3) 25 MCG (1000 UNIT) tablet Take 4,000 Units by mouth daily.   Fe Fum-FePoly-Vit C-Vit B3 (INTEGRA) 62.5-62.5-40-3 MG CAPS Take 1 tablet by mouth  daily.   FLUoxetine  (PROZAC ) 40 MG capsule Takes 1 capsule Daily for Mood   lamoTRIgine  (LAMICTAL ) 100 MG tablet Take 1 tablet (100 mg total) by mouth daily.   modafinil  (PROVIGIL ) 200 MG tablet Take 1 tablet (200 mg total) by mouth daily.   Multiple Vitamin (MULTIVITAMIN) capsule Take 1 capsule by mouth daily.   ondansetron  (ZOFRAN -ODT) 8 MG disintegrating tablet DISSOLVE 1 TABLET ON THE  TONGUE EVERY 8 HOURS AS  NEEDED FOR NAUSEA AND  VOMITING   pantoprazole  (PROTONIX ) 40 MG tablet Take  1 tablet 2 x / day  to Prevent Heartburn &  Indigestion   SUMAtriptan  (IMITREX ) 25 MG tablet TAKE 1 TO 2 TABLETS BY MOUTH WITH ONSET OF MIGRAINE. MAY REPEAT IN 2 HOURS IF HEADACHE PERSISTS. MAX OF 4 TABLETS IN 24 HOURS   [DISCONTINUED] Evolocumab  (REPATHA ) 140 MG/ML SOSY Inject 140 mg into the skin every 14 (fourteen) days.   [DISCONTINUED] lamoTRIgine  (LAMICTAL ) 150 MG tablet Take  1 tablet  Daily  for  Mood                          /                                                      Take  by                          mouth   Evolocumab  (REPATHA ) 140 MG/ML SOSY Inject 140 mg into the skin every 14 (fourteen) days.   [DISCONTINUED] Cyanocobalamin  2500 MCG SUBL Place 1 tablet (2,500 mcg total) under the tongue daily.   No facility-administered encounter medications on file as of 03/07/2024.    Past Medical History:  Diagnosis Date   ADD (attention deficit disorder)    Adnexal mass 04/08/2020   Anemia    Cataract    Chronic kidney disease    Stage !!!   Depression    Extrinsic asthma 11/09/2014   Fatty liver    Gait abnormality 12/26/2019   GERD (gastroesophageal reflux disease)    Hay fever    Head trauma    Hyperlipidemia    IBS (irritable bowel syndrome)    MCI (mild cognitive impairment) with memory loss    Memory loss    Migraines    OSA on CPAP    Osteoporosis    Reflux    RLS (restless legs syndrome)    Sinus congestion    Sleep apnea     Past Surgical  History:  Procedure Laterality Date   ABDOMINAL HYSTERECTOMY     APPENDECTOMY     BREAST BIOPSY Right 02/27/2014   Stereo- Benign   CHOLECYSTECTOMY  11/03/2017   Dr. Rubin   COSMETIC SURGERY     EYE SURGERY     FRACTURE SURGERY     JOINT REPLACEMENT     LAPAROSCOPIC HYSTERECTOMY  12/2017   NASAL SINUS SURGERY     ORIF ANKLE FRACTURE BIMALLEOLAR Left 03/14/2019   [facility], Dr. Irene   perforated eardrum     left side   TONSILLECTOMY AND ADENOIDECTOMY     URETEROSCOPY     for ureteral stone    Family History  Problem Relation Age of Onset   Diabetes Mother    Hearing loss Mother    Hypertension Mother    Miscarriages / Stillbirths Mother    Heart failure Father    Dementia Father    Mood Disorder Father    Anxiety disorder Father    Arthritis Father    Depression Father    Heart disease Father    Hypertension Father    Diabetes Sister    Anxiety disorder Sister    Depression Sister    Hearing loss Sister    Hypertension Sister    Kidney disease Sister    Obesity Sister    Osteoporosis Brother    Bipolar disorder Brother    Anxiety disorder Brother    Depression Brother    Breast cancer Maternal Aunt 45   Arrhythmia Maternal Aunt    Stomach cancer Maternal Aunt    Breast cancer Maternal Grandmother 50   Cancer Maternal Grandmother    Diabetes Maternal Grandmother    Stroke Maternal Grandmother    Heart failure Maternal Grandfather 34   Alcohol abuse Maternal Grandfather    Hearing loss Maternal Grandfather    Colon cancer Paternal Grandmother 68   Mental illness Paternal Grandmother    Cancer Paternal Grandmother    Breast cancer Cousin    Arrhythmia Maternal Aunt    Vision loss Son    Colon polyps Neg Hx    Esophageal cancer Neg Hx    Rectal cancer Neg Hx     Social History   Socioeconomic History  Marital status: Divorced    Spouse name: Not on file   Number of children: 2   Years of education: 20   Highest education level: Associate  degree: occupational, Scientist, product/process development, or vocational program  Occupational History    Employer: [employer]    Comment: Flight Attendant  Tobacco Use   Smoking status: Never   Smokeless tobacco: Never   Tobacco comments:    None  Vaping Use   Vaping status: Never Used  Substance and Sexual Activity   Alcohol use: Not Currently    Alcohol/week: 0.0 - 1.0 standard drinks of alcohol    Comment: Once a year   Drug use: No   Sexual activity: Not Currently    Birth control/protection: I.U.D., Pill, Post-menopausal  Other Topics Concern   Not on file  Social History Narrative   Patient is single and lives at home alone. Patient is flight attendant. Patient has college education one yearRight handed.Caffeine- Rare.    Drinks no caffeine   Social Drivers of Corporate investment banker Strain: Low Risk  (03/07/2024)   Overall Financial Resource Strain (CARDIA)    Difficulty of Paying Living Expenses: Not very hard  Food Insecurity: No Food Insecurity (03/07/2024)   Hunger Vital Sign    Worried About Running Out of Food in the Last Year: Never true    Ran Out of Food in the Last Year: Never true  Transportation Needs: No Transportation Needs (03/07/2024)   PRAPARE - Administrator, Civil Service (Medical): No    Lack of Transportation (Non-Medical): No  Physical Activity: Insufficiently Active (03/07/2024)   Exercise Vital Sign    Days of Exercise per Week: 1 day    Minutes of Exercise per Session: 30 min  Stress: No Stress Concern Present (03/07/2024)   [employer] of Occupational Health - Occupational Stress Questionnaire    Feeling of Stress: Not at all  Social Connections: Socially Isolated (03/07/2024)   Social Connection and Isolation Panel    Frequency of Communication with Friends and Family: More than three times a week    Frequency of Social Gatherings with Friends and Family: Once a week    Attends Religious Services: Never    Database administrator or  Organizations: No    Attends Engineer, structural: Not on file    Marital Status: Divorced  Intimate Partner Violence: Unknown (03/12/2023)   Received from [facility]   HITS    Physically Hurt: Not on file    Insult or Talk Down To: Not on file    Threaten Physical Harm: Not on file    Scream or Curse: Not on file       Objective:    BP 110/70 (BP Location: Left Arm, Patient Position: Sitting)   Pulse 79   Temp 97.8 F (36.6 C) (Temporal)   Ht 5' 6.5 (1.689 m)   Wt 183 lb 3.2 oz (83.1 kg)   SpO2 96%   BMI 29.13 kg/m    Physical Exam Vitals and nursing note reviewed.  Constitutional:      General: She is not in acute distress.    Appearance: Normal appearance. She is normal weight.  HENT:     Head: Normocephalic and atraumatic.     Right Ear: External ear normal.     Left Ear: External ear normal.     Nose: Nose normal.     Mouth/Throat:     Mouth: Mucous membranes are moist.  Pharynx: Oropharynx is clear.  Eyes:     Extraocular Movements: Extraocular movements intact.     Pupils: Pupils are equal, round, and reactive to light.  Cardiovascular:     Rate and Rhythm: Normal rate and regular rhythm.     Pulses: Normal pulses.     Heart sounds: Normal heart sounds.  Pulmonary:     Effort: Pulmonary effort is normal. No respiratory distress.     Breath sounds: Normal breath sounds. No wheezing, rhonchi or rales.  Musculoskeletal:        General: Normal range of motion.     Cervical back: Normal range of motion.     Right lower leg: No edema.     Left lower leg: No edema.  Lymphadenopathy:     Cervical: No cervical adenopathy.  Neurological:     General: No focal deficit present.     Mental Status: She is alert and oriented to person, place, and time.  Psychiatric:        Mood and Affect: Mood normal.        Thought Content: Thought content normal.     Results for orders placed or performed in visit on 03/07/24  CBC with Differential/Platelet   Result Value Ref Range   WBC 6.8 4.0 - 10.5 K/uL   RBC 4.78 3.87 - 5.11 Mil/uL   Hemoglobin 13.2 12.0 - 15.0 g/dL   HCT 59.3 63.9 - 53.9 %   MCV 85.0 78.0 - 100.0 fl   MCHC 32.4 30.0 - 36.0 g/dL   RDW 85.6 88.4 - 84.4 %   Platelets 311.0 150.0 - 400.0 K/uL   Neutrophils Relative % 69.4 43.0 - 77.0 %   Lymphocytes Relative 20.2 12.0 - 46.0 %   Monocytes Relative 8.1 3.0 - 12.0 %   Eosinophils Relative 1.6 0.0 - 5.0 %   Basophils Relative 0.7 0.0 - 3.0 %   Neutro Abs 4.8 1.4 - 7.7 K/uL   Lymphs Abs 1.4 0.7 - 4.0 K/uL   Monocytes Absolute 0.6 0.1 - 1.0 K/uL   Eosinophils Absolute 0.1 0.0 - 0.7 K/uL   Basophils Absolute 0.0 0.0 - 0.1 K/uL  Comprehensive metabolic panel with GFR  Result Value Ref Range   Sodium 138 135 - 145 mEq/L   Potassium 4.2 3.5 - 5.1 mEq/L   Chloride 104 96 - 112 mEq/L   CO2 25 19 - 32 mEq/L   Glucose, Bld 95 70 - 99 mg/dL   BUN 32 (H) 6 - 23 mg/dL   Creatinine, Ser 9.09 0.40 - 1.20 mg/dL   Total Bilirubin 0.4 0.2 - 1.2 mg/dL   Alkaline Phosphatase 75 39 - 117 U/L   AST 43 (H) 0 - 37 U/L   ALT 38 (H) 0 - 35 U/L   Total Protein 7.2 6.0 - 8.3 g/dL   Albumin 4.7 3.5 - 5.2 g/dL   GFR 36.84 >39.99 mL/min   Calcium 11.4 (H) 8.4 - 10.5 mg/dL  Hemoglobin J8r  Result Value Ref Range   Hgb A1c MFr Bld 5.8 4.6 - 6.5 %  Lipid panel  Result Value Ref Range   Cholesterol 146 0 - 200 mg/dL   Triglycerides 753.9 (H) 0.0 - 149.0 mg/dL   HDL 36.19 >60.99 mg/dL   VLDL 50.7 (H) 0.0 - 59.9 mg/dL   LDL Cholesterol 33 0 - 99 mg/dL   Total CHOL/HDL Ratio 2    NonHDL 82.55   Microalbumin / creatinine urine ratio  Result Value Ref Range  Microalb, Ur 1.9 0.0 - 1.9 mg/dL   Creatinine,U 871.4 mg/dL   Microalb Creat Ratio 14.9 0.0 - 30.0 mg/g  VITAMIN D  25 Hydroxy (Vit-D Deficiency, Fractures)  Result Value Ref Range   VITD 44.08 30.00 - 100.00 ng/mL  Vitamin B12  Result Value Ref Range   Vitamin B-12 513 211 - 911 pg/mL        Assessment & Plan:   Assessment  and Plan Assessment & Plan Mitral Valve Regurgitation/Palpitations Palpitations and Shortness of Breath Chronic palpitations and dyspnea, worsening. Overdue for cardiology follow-up. - Refer to cardiology for evaluation. - Coordinate with previous cardiologist if preferred.  Hyperlipidemia Previously managed with Repatha , effective. Recent insurance issues caused a lapse, potentially affecting lipid levels. - Enroll in Repatha  program with pharmacist assistance. - Monitor lipid levels with current lab work. - Consider adding Zetia  if lipid levels are not controlled with Repatha  alone.  Bipolar Disorder Well-managed on Lamictal  150 mg but experiencing somnolence. Neurologist identified lamotrigine  as a potential cause. Modafinil  initiated, showing positive effects. - Decrease Lamictal  to 100 mg. - Consider further dose adjustments if necessary. - Continue modafinil  and assess its effectiveness.  Mild Cognitive Impairment Participating in a clinical trial. Neurologist considering further evaluation with PET scan and potential treatment targeting amyloid plaques. - Continue participation in clinical trial. - Coordinate with neurologist for further evaluation and potential treatment.  Urinary Incontinence Scheduled for E coin procedure. Previous pelvic floor therapy provided limited relief. E coin is a less invasive option. - Proceed with E coin procedure on March 15, 2024.  OSA - continue CPAP  Follow-up Follow-up plans discussed for various conditions and treatments. - Follow up in 3 months unless issues arise with Lamictal  adjustment. - Coordinate with cardiology for follow-up on palpitations and dyspnea. - Continue communication with clinical trial team and neurologist.     Orders Placed This Encounter  Procedures   CBC with Differential/Platelet    Release to patient:   Immediate [1]   Comprehensive metabolic panel with GFR    Release to patient:   Immediate [1]    Hemoglobin A1c   Lipid panel   Microalbumin / creatinine urine ratio    Release to patient:   Immediate   VITAMIN D  25 Hydroxy (Vit-D Deficiency, Fractures)   Vitamin B12   AMB Referral VBCI Care Management    Referral Priority:   Routine    Referral Type:   Consultation    Referral Reason:   Care Coordination    Number of Visits Requested:   1   Ambulatory referral to Cardiology    Referral Priority:   Routine    Referral Type:   Consultation    Referral Reason:   Specialty Services Required    Referred to Provider:   Tobb, Kardie, DO    Number of Visits Requested:   1     Meds ordered this encounter  Medications   Evolocumab  (REPATHA ) 140 MG/ML SOSY    Sig: Inject 140 mg into the skin every 14 (fourteen) days.    Dispense:  2.1 mL    Refill:  12    PA approved. Please fill and notify the patient. Thank You!   lamoTRIgine  (LAMICTAL ) 100 MG tablet    Sig: Take 1 tablet (100 mg total) by mouth daily.    Dispense:  30 tablet    Refill:  1    Return in about 3 months (around 06/07/2024) for meds, labs.  Corean LITTIE Ku, FNP

## 2024-03-08 ENCOUNTER — Telehealth: Payer: Self-pay

## 2024-03-08 ENCOUNTER — Other Ambulatory Visit (HOSPITAL_COMMUNITY): Payer: Self-pay

## 2024-03-08 NOTE — Telephone Encounter (Signed)
 Pharmacy Patient Advocate Encounter   Received notification from Patient Pharmacy that prior authorization for Repatha  140mg /ml syringes is required/requested.   Insurance verification completed.   The patient is insured through [facility] .   Per test claim: PA required; PA submitted to above mentioned insurance via CoverMyMeds Key/confirmation #/EOC [id number] Status is pending

## 2024-03-09 NOTE — Telephone Encounter (Signed)
 Pharmacy Patient Advocate Encounter  Received notification from [employer] that Prior Authorization for Repatha  140mg /ml has been DENIED.  See denial reason below. No denial letter attached in CMM. Will attach denial letter to Media tab once received.   PA #/Case ID/Reference #: [id number]

## 2024-03-10 ENCOUNTER — Encounter: Payer: Self-pay | Admitting: Family Medicine

## 2024-03-11 ENCOUNTER — Ambulatory Visit: Payer: Self-pay | Admitting: Family Medicine

## 2024-03-15 ENCOUNTER — Telehealth: Payer: Self-pay | Admitting: *Deleted

## 2024-03-15 NOTE — Progress Notes (Signed)
 Care Guide Pharmacy Note  03/15/2024 Name: [name] MRN: [id number] DOB: 1951/07/16  Referred By: Alvia Corean CROME, FNP Reason for referral: Call Attempt #1 and Complex Care Management (Outreach to schedule referral with pharmacist )   Kimberly Ray is a 73 y.o. year old female who is a primary care patient of Alvia Corean CROME, FNP.  Kimberly Ray was referred to the pharmacist for assistance related to: CKD Stage 3  An unsuccessful telephone outreach was attempted today to contact the patient who was referred to the pharmacy team for assistance with medication assistance. Additional attempts will be made to contact the patient.  Kimberly Ray, CMA [place]  [facility], [facility] Guide Direct Dial: [phone]  Fax: [phone] Website: [email or web address]

## 2024-03-15 NOTE — Progress Notes (Signed)
 Care Guide Pharmacy Note  03/15/2024 Name: Latifah Padin MRN: [id number] DOB: 1951-08-03  Referred By: Alvia Corean CROME, FNP Reason for referral: Call Attempt #1 and Complex Care Management (Outreach to schedule referral with pharmacist )   Stamatia Masri is a 73 y.o. year old female who is a primary care patient of Alvia Corean CROME, FNP.  Nandi Tonnesen was referred to the pharmacist for assistance related to: CKD Stage 3  Successful contact was made with the patient to discuss pharmacy services including being ready for the pharmacist to call at least 5 minutes before the scheduled appointment time and to have medication bottles and any blood pressure readings ready for review. The patient agreed to meet with the pharmacist via telephone visit on 03/25/2024  Thedford Franks, CMA [place]  [facility], [facility] Guide Direct Dial: [phone]  Fax: [phone] Website: [email or web address]

## 2024-03-25 ENCOUNTER — Other Ambulatory Visit

## 2024-03-28 DIAGNOSIS — H04123 Dry eye syndrome of bilateral lacrimal glands: Secondary | ICD-10-CM | POA: Diagnosis not present

## 2024-03-28 DIAGNOSIS — Z961 Presence of intraocular lens: Secondary | ICD-10-CM | POA: Diagnosis not present

## 2024-03-29 ENCOUNTER — Telehealth: Payer: Self-pay | Admitting: Neurology

## 2024-03-29 ENCOUNTER — Ambulatory Visit (INDEPENDENT_AMBULATORY_CARE_PROVIDER_SITE_OTHER): Admitting: Neurology

## 2024-03-29 DIAGNOSIS — G4710 Hypersomnia, unspecified: Secondary | ICD-10-CM | POA: Diagnosis not present

## 2024-03-29 DIAGNOSIS — G309 Alzheimer's disease, unspecified: Secondary | ICD-10-CM

## 2024-03-29 DIAGNOSIS — R350 Frequency of micturition: Secondary | ICD-10-CM | POA: Diagnosis not present

## 2024-03-29 DIAGNOSIS — G4733 Obstructive sleep apnea (adult) (pediatric): Secondary | ICD-10-CM

## 2024-03-29 DIAGNOSIS — G3184 Mild cognitive impairment, so stated: Secondary | ICD-10-CM

## 2024-03-29 NOTE — Telephone Encounter (Signed)
 Pt came into office states her clinical trial she has been participating in is ending August 25th and she has a lumbar puncture scheduled for next week. Pt is requesting to see Dr. Chalice after she has results of the clinical trial, states this is something they discussed in appt.Pt also states a PET scan was talked about in the appt and she is wondering if that needs to be scheduled or she needs to be seen prior. Scheduled visit with Dr. Chalice for next available 07/18/2024. Would like a call to discuss.

## 2024-03-29 NOTE — Telephone Encounter (Signed)
 Called the patient and asked for clarification. She has been on a research trial for cognitive research through [facility] on [address].  She provided a release form for them to send records and I advised I don't have anything. I will have our medical records person reach out to receive all the information. Pt picked up her sleep study rest today to be completed.  She was inquiring about PET imaging and whether Dr Chalice would want to do that. Advised I will first see if we can get the records first because we need to make sure appropriate labs were taken. She verbalized understanding

## 2024-03-30 ENCOUNTER — Other Ambulatory Visit (INDEPENDENT_AMBULATORY_CARE_PROVIDER_SITE_OTHER)

## 2024-03-30 DIAGNOSIS — E1169 Type 2 diabetes mellitus with other specified complication: Secondary | ICD-10-CM

## 2024-03-30 DIAGNOSIS — E785 Hyperlipidemia, unspecified: Secondary | ICD-10-CM

## 2024-03-30 NOTE — Progress Notes (Unsigned)
   03/30/2024 Name: Kimberly Ray MRN: [id number] DOB: 10/01/1950  Chief Complaint  Patient presents with   Medication Assistance    Kimberly Ray is a 73 y.o. year old female who presented for a telephone visit.   They were referred to the pharmacist by their PCP for assistance in managing medication access.   Subjective:  Care Team: Primary Care Provider: Alvia Corean CROME, FNP ; Next Scheduled Visit: 06/07/2024   Medication Access/Adherence  Current Pharmacy:  [facility] [id number] [place], [place] - [address] [place] [address] Phone: [phone] Fax: [phone]   Patient reports affordability concerns with their medications: No  Patient reports access/transportation concerns to their pharmacy: Yes  Patient reports adherence concerns with their medications:  No     Pt previously received Repatha  through insurance but has changed insurance and PA was denied due to needing to be on statin or ezetimibe . Per pt, she was unable to tolerate pravastatin  or ezetimibe  due to knee pain. She also notes cholesterol did not respond to either medication. She states that she was diagnosed with atherosclerosis with her previous provider at [facility].    Objective:  Lab Results  Component Value Date   HGBA1C 5.8 03/07/2024    Lab Results  Component Value Date   CREATININE 0.90 03/07/2024   BUN 32 (H) 03/07/2024   NA 138 03/07/2024   K 4.2 03/07/2024   CL 104 03/07/2024   CO2 25 03/07/2024    Lab Results  Component Value Date   CHOL 146 03/07/2024   HDL 63.80 03/07/2024   LDLCALC 33 03/07/2024   TRIG 246.0 (H) 03/07/2024   CHOLHDL 2 03/07/2024    Medications Reviewed Today   Medications were not reviewed in this encounter       Assessment/Plan:   Will need to see if we can get records from [facility] as having a CAD diagnosis may make it easier to get approval Will need to submit an  appeal  Darrelyn Drum, PharmD, BCPS, CPP Clinical Pharmacist Practitioner  Primary Care at [facility] [phone]

## 2024-03-31 ENCOUNTER — Encounter: Payer: Self-pay | Admitting: Neurology

## 2024-03-31 NOTE — Progress Notes (Unsigned)
 SABRA

## 2024-03-31 NOTE — Telephone Encounter (Signed)
 Copied from CRM #[id number]. Topic: General - Other >> Mar 31, 2024  8:06 AM Thersia BROCKS wrote: Reason for CRM: Patient called in regarding getting a message to nurse, would like a call back when she is avaliable

## 2024-03-31 NOTE — Telephone Encounter (Signed)
 Spoke with patient, she did not mean to call our office she was trying to reach neurology

## 2024-04-01 NOTE — Discharge Instructions (Signed)

## 2024-04-04 ENCOUNTER — Inpatient Hospital Stay
Admission: RE | Admit: 2024-04-04 | Discharge: 2024-04-04 | Disposition: A | Discharge status: Home / Self Care | Payer: Medicare (Managed Care) | Source: Ambulatory Visit | Attending: Emergency Medicine | Admitting: Emergency Medicine

## 2024-04-04 DIAGNOSIS — F0280 Dementia in other diseases classified elsewhere without behavioral disturbance: Secondary | ICD-10-CM

## 2024-04-04 DIAGNOSIS — G309 Alzheimer's disease, unspecified: Secondary | ICD-10-CM | POA: Diagnosis not present

## 2024-04-06 ENCOUNTER — Encounter: Payer: Self-pay | Admitting: Family Medicine

## 2024-04-06 ENCOUNTER — Other Ambulatory Visit: Payer: Self-pay

## 2024-04-06 ENCOUNTER — Ambulatory Visit: Payer: Self-pay | Admitting: Neurology

## 2024-04-06 DIAGNOSIS — G4733 Obstructive sleep apnea (adult) (pediatric): Secondary | ICD-10-CM | POA: Insufficient documentation

## 2024-04-06 DIAGNOSIS — G43009 Migraine without aura, not intractable, without status migrainosus: Secondary | ICD-10-CM

## 2024-04-06 MED ORDER — SUMATRIPTAN SUCCINATE 25 MG PO TABS: ORAL_TABLET | ORAL | 3 refills | Status: AC

## 2024-04-06 NOTE — Progress Notes (Deleted)
   Established Patient Office Visit  Subjective   Patient ID: Kimberly Ray, female    DOB: May 15, 1951  Age: 73 y.o. MRN: [id number]  No chief complaint on file.   HPI  {History (Optional):23778}  ROS    Objective:     There were no vitals taken for this visit. {Vitals History (Optional):23777}  Physical Exam   No results found for any visits on 03/29/24.  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    Dedra Gores, MD

## 2024-04-06 NOTE — Procedures (Signed)
 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  Today, MoCA 28/30. Remains in clinical trial for memory with [employer], is in double blind study for memory, drug is for erectile dysfunction, idea is it increases blood flow to the brain. Has been in study 5 months. She feels she is getting the drug. Going to [employer], had MRI brain in Sept 2024. Concerned with memory, short term, enters appointments incorrect on her calendar, math calculation. Living alone, driving, has to be careful with directions has to be intentional. She has felt her memory loss has stabilized since taking trial drug. She has 2 CPAP machines (brought card for download that was issued to someone other than her-robert o'leary), has another machine she keeps at her mother's house, uses it every night. Mood has been well, sees counselor, psychiatry, last year Lamictal  and Wellbutrin  was increased.  CPAP 06/03/23-08/31/23 card, 58% usage, greater than 4 hours 49%, 6 hours 19 minutes.  Set pressure 7.  AHI 2.4, leak 41.5.     Epworth sleepiness score: 20 /24.   BMI: 30 kg/m   Neck Circumference: 14.25   FINDINGS:   Sleep Summary:   Total Recording Time (hours, min):     7 h 57 m   Total Sleep Time (hours, min):   5 h 5 m,              Percent REM (%):     23.8                                    Respiratory Indices:   Calculated pAHI (per CMS guideline):SABRA  Total AHI was 20.6/h, without central apneas being detected.                        REM pAHI:    26/h                                             NREM pAHI:    19/h                          Positional AHI: Patient experienced the lowest apnea-hypopnea index while sleeping on her left side with 6.3/h and the  highest sleeping on her right side was 23.1/h. Snoring:                                                Oxygen  Saturation Statistics:   Oxygen  Saturation (%) Mean: The mean oxygen  saturation for this patient was 93%, between a nadir at 86% and a maximum saturation of 98%.  There was not even 1 total minute of sleep time in hypoxia recorded.  Pulse Rate Statistics:   Pulse Mean (bpm): The average pulse rate was 72 bpm with a minimum of 55 and a maximum of 102 bpm                         IMPRESSION:  This HST confirms the presence of moderate-severe all obstructive sleep apnea with slight REM sleep accentuation.  Please note that the higher apnea hypopnea index during right lateral sleep was likely correlated to the onset of REM sleep while in this sleep position. This is an uncomplicated obstructive sleep apnea without hypoxia or bradycardia tachycardia   RECOMMENDATION: Continuation of CPAP therapy is indicated, current apnea was well-controlled on 7 cmH2O CPAP 3 cm EPR with a residual AHI of only 0.7/h. I will order of today an auto-titration capable CPAP device by [employer] for this patient with heated humidification, with the interface she chooses to her comfort, and with a setting of 5 through 10 cm water pressure with 2 cm EPR.   Follow-up with nurse practitioner Lauraine Born, NP, within 60 to 90 days of CPAP therapy.  GENERAL RECOMMENDATIONS :  Any patient should be cautioned not to drive, work at heights, or operate dangerous or heavy equipment when tired or sleepy.   Review of good sleep hygiene measures is accessible to any sleep clinic patient and can be reiterated through online material- I we recommend the Guide to better Sleep   by the NIH.   Weight loss and Core Strength improvement is highly recommended for individuals with low muscle tone and/ or a BMI over 30.  Any CPAP patient should be reminded to be fully compliant with PAP therapy ,  (defined as using PAP therapy for more than 4 hours each night ) with the goal to improve sleep related symptoms and decrease long term cardiovascular risks. Any PAP therapy patient should be reminded, that it may take up to 3 months to get fully used to using PAP and it may take 1-2 weeks for an established CPAP user to acclimatize to changes in pressure or mask. The earlier full compliance is achieved, the better long term compliance tends to be.   Please note that untreated obstructive sleep apnea may carry additional perioperative morbidity. Patients with significant obstructive sleep apnea should receive perioperative PAP therapy and the surgical team should be informed of the diagnosis and degree of sleep disordered breathing.  Sleep fragmentation in the presence of normal proportional sleep stages is a nonspecific findings and per se does not signify an intrinsic sleep disorder or a cause for the patient's sleep-related symptoms.  Causes include (but are not limited to) the unfamiliarity of sleeping while recorded by HST device or sleeping in a sleep lab for a full Polysomnography sleep study, but also circadian rhythm disturbances, medication side effects or an underlying mood disorder or medical problem.   The referring physician will be notified of the test results.       INTERPRETING PHYSICIAN:   Dedra Gores, MD  [facility] Sleep Board certified by The [employer] of Sleep Medicine and Diplomate of the [employer] of Sleep Medicine. Board certified In Neurology through the ABPN, Fellow of the [employer] of Neu[employer].

## 2024-04-06 NOTE — Progress Notes (Unsigned)
 [facility]   HOME SLEEP TEST REPORT ( by Watch PAT)   STUDY DATE:     ORDERING CLINICIAN: Lauraine Born , NP  REFERRING CLINICIAN: Lauraine Born, NP  -  PCP listed as Dr Tonita, MD, now deceased.     CLINICAL INFORMATION/HISTORY: PERSISTENT HYOPERSOMNIA ON CPAP ; TOC per NP Slack.  I have once been involved in Kimberly Ray's sleep care for a sleep study ordered by Dr Jenel for REM BD while the patient was treated by him for RLS. Our Sleep study was in early 2018 after which she remained on CPAP,  a therapy which she had been prescribed in 2014 by Dr Burnard.  She has 2 CPAP machines (brought card for download that was issued to someone other than her-Kimberly Ray), has another machine she keeps at her mother's house, uses it every night.  Mood has been well, sees counselor, psychiatry, last year Lamictal  and Wellbutrin  was increased.  CPAP 06/03/23-08/31/23 card, 58% usage, greater than 4 hours 49%, 6 hours 19 minutes.  Set pressure 7.  AHI 2.4, leak 41.5.    Kimberly Ray is a 73 y.o. female patient who is seen upon her request on 02/29/2024 . Kimberly Ray is a very pleasant 73 y.o. year old RH female with a history of hypertension, hyperlipidemia, RLS, IBS, anxiety, depression, OSA on CPAP, GERD, asthma, anemia, DM2, CKD, She is worried about her memory and has been seen for memory by [facility] 2022 and 2024, just 8 months ago . She is enrolled in a clinical trial for cognitive impairment with [employer], had CSF for amyloid and MRI - abnormal CSF amyloid confirmed dx of AD .Cognitive impairment has been stable which has been diagnosed as Alzheimer's disease and followed .        Epworth sleepiness score:  20/ 24 points    BMI:  29 kg/m   Neck Circumference: 14.25   FINDINGS:    Total Recording Time (hours, min):   7 hours 57 minutes  Total Sleep Time (hours, min):         5 hours 14 minutes        Percent REM (%):   24%    The patient  experienced a short sleep latency of only 6 minutes but then she then remained awake for 2 hours before she could go back to sleep.  Therefore,  the wakefulness after sleep onset time was 157 minutes long.                                   Respiratory Indices:   Calculated pAHI (per CMS guideline): 20.6/h, this places the patient into the moderate sleep apnea severity category.                       REM pAHI: 26/h                                                NREM pAHI:    19/h                          Positional AHI:    The patient slept mostly on her right side  171 minutes were recorded associated with an AHI of 23/h while 39 minutes on the left side were associated with only 6.3/h.  Supine sleep was associated with an AHI of 20.5/h.  Please note that there was no REM sleep occurring during the left positional sleep.  Snoring: Mean volume was 43 dB which is moderately loud and snoring was present for about a third of the total recorded sleep time.                                               Oxygen  Saturation Statistics: Oxygen  Saturation (%) Mean:    Mean oxygen  saturation was 93% between the nadir at 86 and a maximum saturation of 98%.  There was 0 minutes of total hypoxia time, hypoxic burden was less than 1% of the night.           Pulse Rate Statistics: Pulse Mean (bpm):      The mean heart rate was 72 bpm between a minimum of 55 and a maximum of 102 bpm.           IMPRESSION:  This HST confirms the presence of moderate severe sleep apnea without any evidence of central sleep apneas.  There was no significant hypoxia noted no bradycardia and mild snoring.  This diagnosis would qualify the patient to continue CPAP therapy on a new device.  With this diagnoses of sleep apnea she is also qualified to take modafinil  or armodafinil for safe driving if needed.  I ordered 100 mg tablets.  The current CPAP device is set at 7 cm water pressure with 3 cm EPR and she has only residual  apnea-hypopnea index of 0.7/h.  She does have a significant air leak using nasal pillows.  RECOMMENDATION: Auto titrating CPAP device by [employer] is heated humidification, set between 5 and 10 cm water pressure 2 cm EPR, heated humidification and mask of patient's comfort and choice. Revisit with nurse practitioner Lauraine Born within the days 68-90 of CPAP use on the new device, and yearly thereafter.  Any patient should be cautioned not to drive, work at heights, or operate dangerous or heavy equipment when tired or sleepy.   Review of good sleep hygiene measures is accessible to any sleep clinic patient and can be reiterated through online material- I we recommend the Guide to better Sleep   by the NIH.   Weight loss and Core Strength improvement is highly recommended for individuals with low muscle tone and/ or a BMI over 30.  Any CPAP patient should be reminded to be fully compliant with PAP therapy , (defined as using PAP therapy for more than 4 hours each night ) with the goal to improve sleep related symptoms and decrease long term cardiovascular risks. Any PAP therapy patient should be reminded, that it may take up to 3 months to get fully used to using PAP and it may take 1-2 weeks for an established CPAP user to acclimatize to changes in pressure or mask. The earlier full compliance is achieved, the better long term compliance tends to be.   Please note that untreated obstructive sleep apnea may carry additional perioperative morbidity. Patients with significant obstructive sleep apnea should receive perioperative PAP therapy and the surgical team should be informed of the diagnosis and degree of sleep disordered breathing.  Sleep fragmentation in the presence of normal proportional sleep  stages is a nonspecific findings and per se does not signify an intrinsic sleep disorder or a cause for the patient's sleep-related symptoms.  Causes include (but are not limited to) the unfamiliarity of  sleeping while recorded by HST device or sleeping in a sleep lab for a full Polysomnography sleep study, but also circadian rhythm disturbances, medication side effects or an underlying mood disorder or medical problem.   The referring physician will be notified of the test results.     INTERPRETING PHYSICIAN:   Dedra Gores, MD  [facility] Board certified by The [employer] of Sleep Medicine and Diplomate of the [employer]. Board certified In Neurology through the ABPN, Fellow of the [employer] Neu[employer].

## 2024-04-07 ENCOUNTER — Encounter: Payer: Self-pay | Admitting: Neurology

## 2024-04-11 ENCOUNTER — Encounter: Payer: Self-pay | Admitting: *Deleted

## 2024-04-12 ENCOUNTER — Encounter

## 2024-04-14 ENCOUNTER — Other Ambulatory Visit: Payer: Self-pay | Admitting: Medical Genetics

## 2024-04-14 ENCOUNTER — Ambulatory Visit (INDEPENDENT_AMBULATORY_CARE_PROVIDER_SITE_OTHER)

## 2024-04-14 VITALS — Ht 66.0 in | Wt 183.0 lb

## 2024-04-14 DIAGNOSIS — Z0000 Encounter for general adult medical examination without abnormal findings: Secondary | ICD-10-CM | POA: Diagnosis not present

## 2024-04-14 NOTE — Patient Instructions (Addendum)
 Kimberly Ray , Thank you for taking time out of your busy schedule to complete your Annual Wellness Visit with me. I enjoyed our conversation and look forward to speaking with you again next year. I, as well as your care team,  appreciate your ongoing commitment to your health goals. Please review the following plan we discussed and let me know if I can assist you in the future. Your Game plan/ To Do List    Referrals: If you haven't heard from the office you've been referred to, please reach out to them at the phone provided.   Follow up Visits: We will see or speak with you next year for your Next Medicare AWV with our clinical staff Have you seen your provider in the last 6 months (3 months if uncontrolled diabetes)? No  Clinician Recommendations:  Aim for 30 minutes of exercise or brisk walking, 6-8 glasses of water, and 5 servings of fruits and vegetables each day. Educated and advised on getting the Tdap vaccine in 2025.      This is a list of the screenings recommended for you:  Health Maintenance  Topic Date Due   Complete foot exam   Never done   COVID-19 Vaccine (7 - 2024-25 season) 04/19/2023   DTaP/Tdap/Td vaccine (3 - Td or Tdap) 08/31/2023   Flu Shot  03/18/2024   Hemoglobin A1C  09/07/2024   Yearly kidney function blood test for diabetes  03/07/2025   Yearly kidney health urinalysis for diabetes  03/07/2025   Eye exam for diabetics  03/21/2025   Mammogram  04/14/2025   Medicare Annual Wellness Visit  04/14/2025   Pneumococcal Vaccine for age over 71  Completed   DEXA scan (bone density measurement)  Completed   Hepatitis C Screening  Completed   Zoster (Shingles) Vaccine  Completed   HPV Vaccine  Aged Out   Meningitis B Vaccine  Aged Out   Colon Cancer Screening  Discontinued    Advanced directives: (In Chart) A copy of your advanced directives are scanned into your chart should your provider ever need it. Advance Care Planning is important because it:  [x]  Makes  sure you receive the medical care that is consistent with your values, goals, and preferences  [x]  It provides guidance to your family and loved ones and reduces their decisional burden about whether or not they are making the right decisions based on your wishes.  Follow the link provided in your after visit summary or read over the paperwork we have mailed to you to help you started getting your Advance Directives in place. If you need assistance in completing these, please reach out to us  so that we can help you!

## 2024-04-14 NOTE — Progress Notes (Signed)
 Subjective:   Kimberly Ray is a 73 y.o. who presents for a Medicare Wellness preventive visit.  As a reminder, Annual Wellness Visits don't include a physical exam, and some assessments may be limited, especially if this visit is performed virtually. We may recommend an in-person follow-up visit with your provider if needed.  Visit Complete: Virtual I connected with  Kimberly Ray on 04/14/24 by a audio enabled telemedicine application and verified that I am speaking with the correct person using two identifiers.  Patient Location: Home  Provider Location: Office/Clinic  I discussed the limitations of evaluation and management by telemedicine. The patient expressed understanding and agreed to proceed.  Vital Signs: Because this visit was a virtual/telehealth visit, some criteria may be missing or patient reported. Any vitals not documented were not able to be obtained and vitals that have been documented are patient reported.  VideoDeclined- This patient declined Librarian, academic. Therefore the visit was completed with audio only.  Persons Participating in Visit: Patient.  AWV Questionnaire: Yes: Patient Medicare AWV questionnaire was completed by the patient on 04/13/2024; I have confirmed that all information answered by patient is correct and no changes since this date.  Cardiac Risk Factors include: advanced age (>24men, >77 women);dyslipidemia     Objective:    Today's Vitals   04/14/24 1422  Weight: 183 lb (83 kg)  Height: 5' 6 (1.676 m)   Body mass index is 29.54 kg/m.     04/14/2024    2:22 PM 01/01/2024   10:01 PM 07/14/2023   10:09 AM 12/12/2021    4:39 PM 11/29/2020    3:10 PM 08/04/2019    3:36 PM 05/06/2019    2:43 PM  Advanced Directives  Does Patient Have a Medical Advance Directive? Yes No Yes Yes Yes Yes No  Type of Estate agent of [place];Living will  Healthcare Power of [employer] of [place];Living will Healthcare Power of [place];Living will Healthcare Power of [place];Living will   Does patient want to make changes to medical advance directive? No - Patient declined  No - Patient declined No - Patient declined No - Patient declined No - Patient declined   Copy of Healthcare Power of Attorney in Chart? Yes - validated most recent copy scanned in chart (See row information)  No - copy requested Yes - validated most recent copy scanned in chart (See row information) No - copy requested No - copy requested   Would patient like information on creating a medical advance directive?       Yes (ED - Information included in AVS)    Current Medications (verified) Outpatient Encounter Medications as of 04/14/2024  Medication Sig   alendronate (FOSAMAX) 70 MG tablet Take 70 mg by mouth once a week.   buPROPion  (WELLBUTRIN  XL) 300 MG 24 hr tablet Take 1 tablet Daily for Mood, Focus & Concentration.   cholecalciferol (VITAMIN D3) 25 MCG (1000 UNIT) tablet Take 4,000 Units by mouth daily.   Evolocumab  (REPATHA ) 140 MG/ML SOSY Inject 140 mg into the skin every 14 (fourteen) days.   Fe Fum-FePoly-Vit C-Vit B3 (INTEGRA) 62.5-62.5-40-3 MG CAPS Take 1 tablet by mouth daily.   FLUoxetine  (PROZAC ) 40 MG capsule Takes 1 capsule Daily for Mood   lamoTRIgine  (LAMICTAL ) 100 MG tablet Take 1 tablet (100 mg total) by mouth daily.   modafinil  (PROVIGIL ) 200 MG tablet Take 1 tablet (200 mg total) by mouth daily.   Multiple Vitamin (MULTIVITAMIN) capsule Take  1 capsule by mouth daily.   ondansetron  (ZOFRAN -ODT) 8 MG disintegrating tablet DISSOLVE 1 TABLET ON THE  TONGUE EVERY 8 HOURS AS  NEEDED FOR NAUSEA AND  VOMITING   pantoprazole  (PROTONIX ) 40 MG tablet Take  1 tablet 2 x / day  to Prevent Heartburn &  Indigestion   SUMAtriptan  (IMITREX ) 25 MG tablet TAKE 1 TO 2 TABLETS BY MOUTH WITH ONSET OF MIGRAINE. MAY REPEAT IN 2 HOURS IF HEADACHE PERSISTS. MAX OF 4 TABLETS IN 24 HOURS    No facility-administered encounter medications on file as of 04/14/2024.    Allergies (verified) Erythromycin, Iodinated contrast media, Penicillins, Clarithromycin, Brintellix [vortioxetine ], and Cholestatin   History: Past Medical History:  Diagnosis Date   ADD (attention deficit disorder)    Adnexal mass 04/08/2020   Anemia    Cataract    Chronic kidney disease    Stage !!!   Depression    Extrinsic asthma 11/09/2014   Fatty liver    Gait abnormality 12/26/2019   GERD (gastroesophageal reflux disease)    Hay fever    Head trauma    Hyperlipidemia    IBS (irritable bowel syndrome)    MCI (mild cognitive impairment) with memory loss    Memory loss    Migraines    OSA on CPAP    Osteoporosis    Reflux    RLS (restless legs syndrome)    Sinus congestion    Sleep apnea    Past Surgical History:  Procedure Laterality Date   ABDOMINAL HYSTERECTOMY     APPENDECTOMY     BREAST BIOPSY Right 02/27/2014   Stereo- Benign   CHOLECYSTECTOMY  11/03/2017   Dr. Rubin   COSMETIC SURGERY     EYE SURGERY     FRACTURE SURGERY     JOINT REPLACEMENT     LAPAROSCOPIC HYSTERECTOMY  12/2017   NASAL SINUS SURGERY     ORIF ANKLE FRACTURE BIMALLEOLAR Left 03/14/2019   [facility], Dr. Irene   perforated eardrum     left side   TONSILLECTOMY AND ADENOIDECTOMY     URETEROSCOPY     for ureteral stone   Family History  Problem Relation Age of Onset   Diabetes Mother    Hearing loss Mother    Hypertension Mother    Miscarriages / Stillbirths Mother    Heart failure Father    Dementia Father    Mood Disorder Father    Anxiety disorder Father    Arthritis Father    Depression Father    Heart disease Father    Hypertension Father    Diabetes Sister    Anxiety disorder Sister    Depression Sister    Hearing loss Sister    Hypertension Sister    Kidney disease Sister    Obesity Sister    Osteoporosis Brother    Bipolar disorder Brother    Anxiety disorder Brother     Depression Brother    Breast cancer Maternal Aunt 45   Arrhythmia Maternal Aunt    Stomach cancer Maternal Aunt    Breast cancer Maternal Grandmother 35   Cancer Maternal Grandmother    Diabetes Maternal Grandmother    Stroke Maternal Grandmother    Heart failure Maternal Grandfather 77   Alcohol abuse Maternal Grandfather    Hearing loss Maternal Grandfather    Colon cancer Paternal Grandmother 58   Mental illness Paternal Grandmother    Cancer Paternal Grandmother    Breast cancer Cousin    Arrhythmia Maternal Aunt  Vision loss Son    Colon polyps Neg Hx    Esophageal cancer Neg Hx    Rectal cancer Neg Hx    Social History   Socioeconomic History   Marital status: Divorced    Spouse name: Not on file   Number of children: 2   Years of education: 13   Highest education level: Tax adviser degree: occupational, Scientist, product/process development, or vocational program  Occupational History    Employer: [employer]    Comment: Flight Attendant  Tobacco Use   Smoking status: Never   Smokeless tobacco: Never   Tobacco comments:    None  Vaping Use   Vaping status: Never Used  Substance and Sexual Activity   Alcohol use: Not Currently    Alcohol/week: 0.0 - 1.0 standard drinks of alcohol    Comment: Once a year   Drug use: No   Sexual activity: Not Currently    Birth control/protection: I.U.D., Pill, Post-menopausal  Other Topics Concern   Not on file  Social History Narrative   Patient is single and lives at home alone. Patient is flight attendant. Patient has college education one yearRight handed.Caffeine- Rare.    Drinks no caffeine   Social Drivers of Corporate investment banker Strain: Low Risk  (04/14/2024)   Overall Financial Resource Strain (CARDIA)    Difficulty of Paying Living Expenses: Not hard at all  Food Insecurity: No Food Insecurity (04/14/2024)   Hunger Vital Sign    Worried About Running Out of Food in the Last Year: Never true    Ran Out of Food in the Last Year:  Never true  Transportation Needs: No Transportation Needs (04/14/2024)   PRAPARE - Administrator, Civil Service (Medical): No    Lack of Transportation (Non-Medical): No  Physical Activity: Inactive (04/14/2024)   Exercise Vital Sign    Days of Exercise per Week: 0 days    Minutes of Exercise per Session: 0 min  Stress: No Stress Concern Present (04/14/2024)   [employer] of Occupational Health - Occupational Stress Questionnaire    Feeling of Stress: Not at all  Social Connections: Socially Isolated (04/14/2024)   Social Connection and Isolation Panel    Frequency of Communication with Friends and Family: More than three times a week    Frequency of Social Gatherings with Friends and Family: Once a week    Attends Religious Services: Never    Database administrator or Organizations: No    Attends Engineer, structural: Never    Marital Status: Divorced    Tobacco Counseling Counseling given: Not Answered Tobacco comments: None    Clinical Intake:  Pre-visit preparation completed: Yes  Pain : No/denies pain     BMI - recorded: 29.54 Nutritional Status: BMI 25 -29 Overweight Nutritional Risks: None Diabetes: No  Lab Results  Component Value Date   HGBA1C 5.8 03/07/2024   HGBA1C 5.5 02/17/2023   HGBA1C 6.2 (H) 07/24/2022     How often do you need to have someone help you when you read instructions, pamphlets, or other written materials from your doctor or pharmacy?: 1 - Never  Interpreter Needed?: No  Information entered by :: Kimberly Ray, CMA   Activities of Daily Living     04/14/2024    2:25 PM 04/13/2024    5:01 PM  In your present state of health, do you have any difficulty performing the following activities:  Hearing? 0 0  Vision? 0 0  Difficulty concentrating  or making decisions? 1 1  Walking or climbing stairs? 1 1  Dressing or bathing? 0 0  Doing errands, shopping? 0 0  Preparing Food and eating ? N N  Using the Toilet?  N N  In the past six months, have you accidently leaked urine? CINDERELLA CINDERELLA  Comment wears a pad   Do you have problems with loss of bowel control? CINDERELLA CINDERELLA  Comment wears a pad   Managing your Medications? N N  Managing your Finances? N N  Housekeeping or managing your Housekeeping? N N    Patient Care Team: Alvia Corean CROME, FNP as PCP - General (Family Medicine) Tobb, Kardie, DO as PCP - Cardiology (Cardiology) Rosalie Kitchens, MD as Consulting Physician (Gastroenterology) Marne Kelly Nest, MD as Consulting Physician (Obstetrics and Gynecology) Octavia Charlie Hamilton, MD as Consulting Physician (Ophthalmology)  I have updated your Care Teams any recent Medical Services you may have received from other providers in the past year.     Assessment:   This is a routine wellness examination for Kimberly Ray.  Hearing/Vision screen Hearing Screening - Comments:: Denies hearing difficulties   Vision Screening - Comments:: Wears rx glasses - up to date with routine eye exams with  Dr Charlie Octavia   Goals Addressed               This Visit's Progress     Patient Stated (pt-stated)        Patient stated she has no goals but wants to keep living       Depression Screen     04/14/2024    2:26 PM 03/07/2024    2:23 PM 01/29/2023   11:17 PM 10/24/2022   11:50 AM 12/12/2021    4:46 PM 04/17/2021   10:15 AM 11/29/2020    3:14 PM  PHQ 2/9 Scores  PHQ - 2 Score 0 1 0 0 6 3 3   PHQ- 9 Score 0 8   20 9 6     Fall Risk     04/14/2024    2:25 PM 04/13/2024    5:01 PM 07/14/2023   10:08 AM 01/29/2023   11:17 PM 10/24/2022   11:49 AM  Fall Risk   Falls in the past year? 0 0 0 0 0  Number falls in past yr: 0 0 0    Injury with Fall? 0 0 0    Risk for fall due to : No Fall Risks   No Fall Risks   Follow up Falls evaluation completed;Falls prevention discussed  Falls evaluation completed      MEDICARE RISK AT HOME:  Medicare Risk at Home Any stairs in or around the home?: No If so, are there any  without handrails?: No Home free of loose throw rugs in walkways, pet beds, electrical cords, etc?: Yes Adequate lighting in your home to reduce risk of falls?: Yes Life alert?: No Use of a cane, walker or w/c?: No Grab bars in the bathroom?: No Shower chair or bench in shower?: Yes Elevated toilet seat or a handicapped toilet?: No  TIMED UP AND GO:  Was the test performed?  No  Cognitive Function: 6CIT completed    09/02/2022    2:21 PM 08/15/2021    9:17 AM 10/25/2020    1:12 PM 06/27/2020   10:19 AM 12/26/2019   11:40 AM  MMSE - Mini Mental State Exam  Orientation to time 4 5 5 5 5   Orientation to Place 5 5 5 5 5   Registration  3 3 3 3 3   Attention/ Calculation 2 2 4 4 2   Recall 3 3 3 2 3   Language- name 2 objects 2 2 2 2 2   Language- repeat 1 1 1 1 1   Language- follow 3 step command 3 3 3 3 3   Language- read & follow direction 1 1 1 1 1   Write a sentence 1 1 1 1 1   Copy design 1 1 1 1 1   Total score 26 27 29 28 27       02/29/2024    1:35 PM 09/01/2023    1:38 PM 07/14/2023   11:00 AM 09/14/2020   11:00 AM  Montreal Cognitive Assessment   Visuospatial/ Executive (0/5) 4 5 5 2   Naming (0/3) 3 3 3 3   Attention: Read list of digits (0/2) 2 1 2 1   Attention: Read list of letters (0/1) 1 1 1 1   Attention: Serial 7 subtraction starting at 100 (0/3) 2 3 2 2   Language: Repeat phrase (0/2) 0 2 1 1   Language : Fluency (0/1) 1 1 1  0  Abstraction (0/2) 2 2 2 1   Delayed Recall (0/5) 5 5 3 3   Orientation (0/6) 6 5 6 6   Total 26 28 26 20   Adjusted Score (based on education)   26 21      04/14/2024    2:28 PM  6CIT Screen  What Year? 0 points  What month? 0 points  What time? 0 points  Count back from 20 0 points  Months in reverse 0 points  Repeat phrase 0 points  Total Score 0 points    Immunizations Immunization History  Administered Date(s) Administered   INFLUENZA, HIGH DOSE SEASONAL PF 06/12/2016, 05/13/2017, 06/25/2020   Influenza-Unspecified 06/02/2015,  05/19/2018, 05/19/2019, 05/22/2022   PFIZER(Purple Top)SARS-COV-2 Vaccination 09/12/2019, 10/03/2019, 04/30/2020, 12/08/2020   PPD Test 08/19/2011, 08/30/2013, 11/09/2014   Pfizer Covid-19 Vaccine Bivalent Booster 59yrs & up 05/08/2021   Pfizer(Comirnaty)Fall Seasonal Vaccine 12 years and older 05/26/2022   Pneumococcal Conjugate-13 08/18/2009, 02/18/2019   Pneumococcal Polysaccharide-23 06/12/2016   Td 08/19/2003   Tdap 08/30/2013   Zoster Recombinant(Shingrix) 10/07/2021, 12/09/2021   Zoster, Live 08/18/2010    Screening Tests Health Maintenance  Topic Date Due   FOOT EXAM  Never done   COVID-19 Vaccine (7 - 2024-25 season) 04/19/2023   DTaP/Tdap/Td (3 - Td or Tdap) 08/31/2023   INFLUENZA VACCINE  03/18/2024   HEMOGLOBIN A1C  09/07/2024   Diabetic kidney evaluation - eGFR measurement  03/07/2025   Diabetic kidney evaluation - Urine ACR  03/07/2025   OPHTHALMOLOGY EXAM  03/21/2025   MAMMOGRAM  04/14/2025   Medicare Annual Wellness (AWV)  04/14/2025   Pneumococcal Vaccine: 50+ Years  Completed   DEXA SCAN  Completed   Hepatitis C Screening  Completed   Zoster Vaccines- Shingrix  Completed   HPV VACCINES  Aged Out   Meningococcal B Vaccine  Aged Out   Colonoscopy  Discontinued    Health Maintenance  Health Maintenance Due  Topic Date Due   FOOT EXAM  Never done   COVID-19 Vaccine (7 - 2024-25 season) 04/19/2023   DTaP/Tdap/Td (3 - Td or Tdap) 08/31/2023   INFLUENZA VACCINE  03/18/2024   Health Maintenance Items Addressed:  04/14/2024  Additional Screening:  Vision Screening: Recommended annual ophthalmology exams for early detection of glaucoma and other disorders of the eye. Would you like a referral to an eye doctor? No    Dental Screening: Recommended annual dental exams for proper oral  hygiene  Community Resource Referral / Chronic Care Management: CRR required this visit?  No   CCM required this visit?  No   Plan:    I have personally reviewed and  noted the following in the patient's chart:   Medical and social history Use of alcohol, tobacco or illicit drugs  Current medications and supplements including opioid prescriptions. Patient is not currently taking opioid prescriptions. Functional ability and status Nutritional status Physical activity Advanced directives List of other physicians Hospitalizations, surgeries, and ER visits in previous 12 months Vitals Screenings to include cognitive, depression, and falls Referrals and appointments  In addition, I have reviewed and discussed with patient certain preventive protocols, quality metrics, and best practice recommendations. A written personalized care plan for preventive services as well as general preventive health recommendations were provided to patient.   Kimberly Ray, CMA   04/14/2024   After Visit Summary: (MyChart) Due to this being a telephonic visit, the after visit summary with patients personalized plan was offered to patient via MyChart   Notes: Nothing significant to report at this time.

## 2024-04-19 ENCOUNTER — Other Ambulatory Visit: Payer: Self-pay | Admitting: Family Medicine

## 2024-04-19 DIAGNOSIS — Z1231 Encounter for screening mammogram for malignant neoplasm of breast: Secondary | ICD-10-CM

## 2024-04-26 ENCOUNTER — Encounter

## 2024-04-26 DIAGNOSIS — Z1231 Encounter for screening mammogram for malignant neoplasm of breast: Secondary | ICD-10-CM

## 2024-04-27 ENCOUNTER — Other Ambulatory Visit
Admission: RE | Admit: 2024-04-27 | Discharge: 2024-04-27 | Disposition: A | Discharge status: Home / Self Care | Payer: Self-pay | Source: Ambulatory Visit | Attending: Medical Genetics | Admitting: Medical Genetics

## 2024-05-02 ENCOUNTER — Encounter: Payer: Self-pay | Admitting: Internal Medicine

## 2024-05-02 ENCOUNTER — Other Ambulatory Visit: Payer: Self-pay | Admitting: Family Medicine

## 2024-05-02 ENCOUNTER — Encounter: Payer: Self-pay | Admitting: Family Medicine

## 2024-05-02 DIAGNOSIS — F319 Bipolar disorder, unspecified: Secondary | ICD-10-CM

## 2024-05-02 NOTE — Telephone Encounter (Signed)
 Okay to refill?

## 2024-05-03 ENCOUNTER — Encounter (INDEPENDENT_AMBULATORY_CARE_PROVIDER_SITE_OTHER): Payer: Self-pay

## 2024-05-05 ENCOUNTER — Other Ambulatory Visit (INDEPENDENT_AMBULATORY_CARE_PROVIDER_SITE_OTHER)

## 2024-05-05 DIAGNOSIS — D509 Iron deficiency anemia, unspecified: Secondary | ICD-10-CM | POA: Diagnosis not present

## 2024-05-05 LAB — CBC WITH DIFFERENTIAL/PLATELET
Basophils Absolute: 0 K/uL (ref 0.0–0.1)
Basophils Relative: 0.5 % (ref 0.0–3.0)
Eosinophils Absolute: 0.1 K/uL (ref 0.0–0.7)
Eosinophils Relative: 1.6 % (ref 0.0–5.0)
HCT: 36.1 % (ref 36.0–46.0)
Hemoglobin: 11.8 g/dL — ABNORMAL LOW (ref 12.0–15.0)
Lymphocytes Relative: 25.4 % (ref 12.0–46.0)
Lymphs Abs: 1.8 K/uL (ref 0.7–4.0)
MCHC: 32.8 g/dL (ref 30.0–36.0)
MCV: 85.1 fl (ref 78.0–100.0)
Monocytes Absolute: 0.7 K/uL (ref 0.1–1.0)
Monocytes Relative: 10.2 % (ref 3.0–12.0)
Neutro Abs: 4.5 K/uL (ref 1.4–7.7)
Neutrophils Relative %: 62.3 % (ref 43.0–77.0)
Platelets: 260 K/uL (ref 150.0–400.0)
RBC: 4.24 Mil/uL (ref 3.87–5.11)
RDW: 14.2 % (ref 11.5–15.5)
WBC: 7.2 K/uL (ref 4.0–10.5)

## 2024-05-05 LAB — IBC + FERRITIN
Ferritin: 25.9 ng/mL (ref 10.0–291.0)
Iron: 37 ug/dL — ABNORMAL LOW (ref 42–145)
Saturation Ratios: 8.6 % — ABNORMAL LOW (ref 20.0–50.0)
TIBC: 432.6 ug/dL (ref 250.0–450.0)
Transferrin: 309 mg/dL (ref 212.0–360.0)

## 2024-05-06 ENCOUNTER — Encounter: Payer: Self-pay | Admitting: Internal Medicine

## 2024-05-06 ENCOUNTER — Encounter: Payer: Medicare Other | Admitting: Nurse Practitioner

## 2024-05-06 ENCOUNTER — Other Ambulatory Visit: Payer: Self-pay

## 2024-05-06 DIAGNOSIS — F339 Major depressive disorder, recurrent, unspecified: Secondary | ICD-10-CM

## 2024-05-06 DIAGNOSIS — Z79899 Other long term (current) drug therapy: Secondary | ICD-10-CM

## 2024-05-06 MED ORDER — FLUOXETINE HCL 40 MG PO CAPS
40.0000 mg | ORAL_CAPSULE | Freq: Every day | ORAL | 1 refills | Status: AC
Start: 2024-05-06 — End: ?

## 2024-05-06 NOTE — Telephone Encounter (Signed)
See note from pt and advise.

## 2024-05-08 ENCOUNTER — Other Ambulatory Visit: Payer: Self-pay | Admitting: Neurology

## 2024-05-08 DIAGNOSIS — G309 Alzheimer's disease, unspecified: Secondary | ICD-10-CM

## 2024-05-08 LAB — GENECONNECT MOLECULAR SCREEN: Genetic Analysis Overall Interpretation: NEGATIVE

## 2024-05-09 ENCOUNTER — Other Ambulatory Visit: Payer: Self-pay | Admitting: Internal Medicine

## 2024-05-09 ENCOUNTER — Telehealth: Payer: Self-pay

## 2024-05-09 ENCOUNTER — Ambulatory Visit: Payer: Self-pay | Admitting: Internal Medicine

## 2024-05-09 ENCOUNTER — Telehealth: Payer: Self-pay | Admitting: Neurology

## 2024-05-09 DIAGNOSIS — D509 Iron deficiency anemia, unspecified: Secondary | ICD-10-CM

## 2024-05-09 DIAGNOSIS — D500 Iron deficiency anemia secondary to blood loss (chronic): Secondary | ICD-10-CM

## 2024-05-09 NOTE — Telephone Encounter (Signed)
 [employer] medicare shara: [id number] exp. 06/23/24 sent to [facility] [phone]

## 2024-05-09 NOTE — Progress Notes (Signed)
 Persistent iron deficiency anemia, recent endoscopic evaluation Oral iron with Integra has been ongoing Remains mildly anemic with borderline low iron studies IV iron ordered today

## 2024-05-09 NOTE — Telephone Encounter (Signed)
 Dr. Albertus, patient will be scheduled as soon as possible.  Auth Submission: NO AUTH NEEDED Site of care: Site of care: [facility] Payer: [employer] medicare Medication & CPT/J Code(s) submitted: Feraheme (ferumoxytol ) [id number] Diagnosis Code:  Route of submission (phone, fax, portal):  Phone # Fax # Auth type: Buy/Bill PB Units/visits requested: 510mg  x 2 doses Reference number:  Approval from: 05/09/24 to 08/17/24

## 2024-05-10 ENCOUNTER — Ambulatory Visit (INDEPENDENT_AMBULATORY_CARE_PROVIDER_SITE_OTHER): Admitting: *Deleted

## 2024-05-10 VITALS — BP 115/66 | HR 74 | Temp 97.9°F | Resp 16 | Ht 66.0 in | Wt 186.4 lb

## 2024-05-10 DIAGNOSIS — D500 Iron deficiency anemia secondary to blood loss (chronic): Secondary | ICD-10-CM

## 2024-05-10 DIAGNOSIS — D509 Iron deficiency anemia, unspecified: Secondary | ICD-10-CM | POA: Diagnosis not present

## 2024-05-10 MED ORDER — SODIUM CHLORIDE 0.9 % IV SOLN
510.0000 mg | Freq: Once | INTRAVENOUS | Status: AC
  Administered 2024-05-10: 510 mg via INTRAVENOUS
  Filled 2024-05-10: qty 17

## 2024-05-10 NOTE — Progress Notes (Signed)
 Diagnosis: Iron Deficiency Anemia  Provider:  Mannam, Praveen MD  Procedure: IV Infusion  IV Type: Peripheral, IV Location: L Hand  Feraheme (Ferumoxytol ), Dose: 510 mg  Infusion Start Time: 1016 am  Infusion Stop Time: 1032 am  Post Infusion IV Care: Observation period completed and Peripheral IV Discontinued  Discharge: Condition: Good, Destination: Home . AVS Provided  Performed by:  Trudy Lamarr LABOR, RN

## 2024-05-10 NOTE — Patient Instructions (Signed)

## 2024-05-16 ENCOUNTER — Encounter (HOSPITAL_COMMUNITY)
Admission: RE | Admit: 2024-05-16 | Discharge: 2024-05-16 | Disposition: A | Discharge status: Home / Self Care | Source: Ambulatory Visit | Attending: Neurology | Admitting: Neurology

## 2024-05-16 DIAGNOSIS — G309 Alzheimer's disease, unspecified: Secondary | ICD-10-CM | POA: Insufficient documentation

## 2024-05-16 MED ORDER — FLORBETAPIR F 18 500-1900 MBQ/ML IV SOLN
10.4100 | Freq: Once | INTRAVENOUS | Status: AC
  Administered 2024-05-16: 10.41 via INTRAVENOUS

## 2024-05-17 ENCOUNTER — Ambulatory Visit (INDEPENDENT_AMBULATORY_CARE_PROVIDER_SITE_OTHER)

## 2024-05-17 ENCOUNTER — Ambulatory Visit
Admission: RE | Admit: 2024-05-17 | Discharge: 2024-05-17 | Disposition: A | Source: Ambulatory Visit | Attending: Family Medicine

## 2024-05-17 VITALS — BP 117/73 | HR 75 | Temp 97.8°F | Resp 20 | Ht 66.0 in | Wt 186.8 lb

## 2024-05-17 DIAGNOSIS — Z1231 Encounter for screening mammogram for malignant neoplasm of breast: Secondary | ICD-10-CM | POA: Diagnosis not present

## 2024-05-17 DIAGNOSIS — D500 Iron deficiency anemia secondary to blood loss (chronic): Secondary | ICD-10-CM

## 2024-05-17 DIAGNOSIS — D509 Iron deficiency anemia, unspecified: Secondary | ICD-10-CM | POA: Diagnosis not present

## 2024-05-17 MED ORDER — SODIUM CHLORIDE 0.9 % IV SOLN
510.0000 mg | Freq: Once | INTRAVENOUS | Status: AC
  Administered 2024-05-17: 510 mg via INTRAVENOUS
  Filled 2024-05-17: qty 17

## 2024-05-17 NOTE — Progress Notes (Signed)
 Diagnosis: Iron Deficiency Anemia  Provider:  Praveen Mannam MD  Procedure: IV Infusion  IV Type: Peripheral, IV Location: R Antecubital  Feraheme (Ferumoxytol ), Dose: 510 mg  Infusion Start Time: 0840  Infusion Stop Time: 0905  Post Infusion IV Care: Observation period completed  Discharge: Condition: Good, Destination: Home . AVS Provided  Performed by:  Eleanor DELENA Bloch, RN

## 2024-05-18 ENCOUNTER — Ambulatory Visit: Payer: Self-pay | Admitting: Neurology

## 2024-06-06 NOTE — Progress Notes (Deleted)
   Established Patient Office Visit  Subjective:     Patient ID: Kimberly Ray, female    DOB: 09-17-1950, 74 y.o.   MRN: [id number]  No chief complaint on file.   HPI  Discussed the use of AI scribe software for clinical note transcription with the patient, who gave verbal consent to proceed.  History of Present Illness      ROS Per HPI      Objective:    There were no vitals taken for this visit.   Physical Exam Vitals and nursing note reviewed.  Constitutional:      General: She is not in acute distress.    Appearance: Normal appearance. She is normal weight.  HENT:     Head: Normocephalic and atraumatic.     Right Ear: External ear normal.     Left Ear: External ear normal.     Nose: Nose normal.     Mouth/Throat:     Mouth: Mucous membranes are moist.     Pharynx: Oropharynx is clear.  Eyes:     Extraocular Movements: Extraocular movements intact.     Pupils: Pupils are equal, round, and reactive to light.  Cardiovascular:     Rate and Rhythm: Normal rate and regular rhythm.     Pulses: Normal pulses.     Heart sounds: Normal heart sounds.  Pulmonary:     Effort: Pulmonary effort is normal. No respiratory distress.     Breath sounds: Normal breath sounds. No wheezing, rhonchi or rales.  Musculoskeletal:        General: Normal range of motion.     Cervical back: Normal range of motion.     Right lower leg: No edema.     Left lower leg: No edema.  Lymphadenopathy:     Cervical: No cervical adenopathy.  Neurological:     General: No focal deficit present.     Mental Status: She is alert and oriented to person, place, and time.  Psychiatric:        Mood and Affect: Mood normal.        Thought Content: Thought content normal.     No results found for any visits on 06/07/24.  The 10-year ASCVD risk score (Arnett DK, et al., 2019) is: 21.5%  {Vitals History (Optional):23777}  {Show previous labs (optional):23779}     Assessment & Plan:    Assessment and Plan Assessment & Plan      No orders of the defined types were placed in this encounter.    No orders of the defined types were placed in this encounter.   No follow-ups on file.  Corean LITTIE Ku, FNP

## 2024-06-07 ENCOUNTER — Ambulatory Visit: Admitting: Family Medicine

## 2024-06-08 ENCOUNTER — Other Ambulatory Visit: Payer: Self-pay | Admitting: Internal Medicine

## 2024-06-17 ENCOUNTER — Ambulatory Visit: Payer: Self-pay

## 2024-06-17 NOTE — Telephone Encounter (Signed)
 FYI Only or Action Required?: FYI only for provider: UC.  Patient was last seen in primary care on 03/07/2024 by Alvia Corean CROME, FNP.  Called Nurse Triage reporting Diarrhea.  Symptoms began a week ago.  Interventions attempted: OTC medications: imodium, pepto bismol.  Symptoms are: unchanged.  Triage Disposition: See Physician Within 24 Hours  Patient/caregiver understands and will follow disposition?: Yes   Reason for Disposition  [1] MODERATE diarrhea (e.g., 4-6 times / day more than normal) AND [2] present > 48 hours (2 days)  Answer Assessment - Initial Assessment Questions No avaialble appts today. Advised UC today and ED if symptoms worsen.  Nurse provided pt with  UC at [facility] information.  1. DIARRHEA SEVERITY: How bad is the diarrhea? How many more stools have you had in the past 24 hours than normal?      Eat and 1 hour later BM; 4x  2. ONSET: When did the diarrhea begin?      2 weeks ago 3. STOOL DESCRIPTION:  How loose or watery is the diarrhea? What is the stool color? Is there any blood or mucous in the stool?     Loose and watery, dark brown, long yellow stringy x1 (3 to 4 inch long), denies blood or mucous Changed diet and took imodium, pepto bismol 4. VOMITING: Are you also vomiting? If Yes, ask: How many times in the past 24 hours?      no 5. ABDOMEN PAIN: Are you having any abdomen pain? If Yes, ask: What does it feel like? (e.g., crampy, dull, intermittent, constant)      no 6. ABDOMEN PAIN SEVERITY: If present, ask: How bad is the pain?  (e.g., Scale 1-10; mild, moderate, or severe)     no 7. ORAL INTAKE: If vomiting, Have you been able to drink liquids? How much liquids have you had in the past 24 hours?     16 oz fluids 8. HYDRATION: Any signs of dehydration? (e.g., dry mouth [not just dry lips], too weak to stand, dizziness, new weight loss) When did you last urinate?     75 oz 9. EXPOSURE: Have you  traveled to a foreign country recently? Have you been exposed to anyone with diarrhea? Could you have eaten any food that was spoiled?     no 10. ANTIBIOTIC USE: Are you taking antibiotics now or have you taken antibiotics in the past 2 months?       no 11. OTHER SYMPTOMS: Do you have any other symptoms? (e.g., fever, blood in stool)     Denies fever, chills, vomiting, dizziness/ faint, dark urine.  Protocols used: [facility]

## 2024-07-04 ENCOUNTER — Other Ambulatory Visit: Payer: Self-pay | Admitting: Family Medicine

## 2024-07-04 DIAGNOSIS — F319 Bipolar disorder, unspecified: Secondary | ICD-10-CM

## 2024-07-11 ENCOUNTER — Ambulatory Visit: Admitting: Family Medicine

## 2024-07-11 VITALS — BP 126/84 | HR 71 | Temp 97.9°F | Ht 66.0 in | Wt 189.0 lb

## 2024-07-11 DIAGNOSIS — E782 Mixed hyperlipidemia: Secondary | ICD-10-CM

## 2024-07-11 DIAGNOSIS — G4733 Obstructive sleep apnea (adult) (pediatric): Secondary | ICD-10-CM

## 2024-07-11 DIAGNOSIS — F339 Major depressive disorder, recurrent, unspecified: Secondary | ICD-10-CM

## 2024-07-11 DIAGNOSIS — N3946 Mixed incontinence: Secondary | ICD-10-CM | POA: Diagnosis not present

## 2024-07-11 DIAGNOSIS — Z789 Other specified health status: Secondary | ICD-10-CM | POA: Diagnosis not present

## 2024-07-11 DIAGNOSIS — E1169 Type 2 diabetes mellitus with other specified complication: Secondary | ICD-10-CM

## 2024-07-11 DIAGNOSIS — R0602 Shortness of breath: Secondary | ICD-10-CM

## 2024-07-11 DIAGNOSIS — Z79899 Other long term (current) drug therapy: Secondary | ICD-10-CM

## 2024-07-11 DIAGNOSIS — F319 Bipolar disorder, unspecified: Secondary | ICD-10-CM

## 2024-07-11 DIAGNOSIS — I340 Nonrheumatic mitral (valve) insufficiency: Secondary | ICD-10-CM

## 2024-07-11 DIAGNOSIS — N1831 Chronic kidney disease, stage 3a: Secondary | ICD-10-CM | POA: Diagnosis not present

## 2024-07-11 DIAGNOSIS — E538 Deficiency of other specified B group vitamins: Secondary | ICD-10-CM

## 2024-07-11 DIAGNOSIS — R635 Abnormal weight gain: Secondary | ICD-10-CM

## 2024-07-11 DIAGNOSIS — E669 Obesity, unspecified: Secondary | ICD-10-CM

## 2024-07-11 DIAGNOSIS — G3184 Mild cognitive impairment, so stated: Secondary | ICD-10-CM | POA: Diagnosis not present

## 2024-07-11 DIAGNOSIS — E8889 Other specified metabolic disorders: Secondary | ICD-10-CM | POA: Insufficient documentation

## 2024-07-11 LAB — CBC WITH DIFFERENTIAL/PLATELET
Basophils Absolute: 0 K/uL (ref 0.0–0.1)
Basophils Relative: 0.4 % (ref 0.0–3.0)
Eosinophils Absolute: 0.1 K/uL (ref 0.0–0.7)
Eosinophils Relative: 1.3 % (ref 0.0–5.0)
HCT: 38.1 % (ref 36.0–46.0)
Hemoglobin: 12.8 g/dL (ref 12.0–15.0)
Lymphocytes Relative: 23.4 % (ref 12.0–46.0)
Lymphs Abs: 1.6 K/uL (ref 0.7–4.0)
MCHC: 33.6 g/dL (ref 30.0–36.0)
MCV: 88.3 fl (ref 78.0–100.0)
Monocytes Absolute: 0.7 K/uL (ref 0.1–1.0)
Monocytes Relative: 10.1 % (ref 3.0–12.0)
Neutro Abs: 4.6 K/uL (ref 1.4–7.7)
Neutrophils Relative %: 64.8 % (ref 43.0–77.0)
Platelets: 252 K/uL (ref 150.0–400.0)
RBC: 4.32 Mil/uL (ref 3.87–5.11)
RDW: 14.6 % (ref 11.5–15.5)
WBC: 7 K/uL (ref 4.0–10.5)

## 2024-07-11 LAB — COMPREHENSIVE METABOLIC PANEL WITH GFR
ALT: 31 U/L (ref 0–35)
AST: 38 U/L — ABNORMAL HIGH (ref 0–37)
Albumin: 4.5 g/dL (ref 3.5–5.2)
Alkaline Phosphatase: 57 U/L (ref 39–117)
BUN: 18 mg/dL (ref 6–23)
CO2: 28 meq/L (ref 19–32)
Calcium: 8.9 mg/dL (ref 8.4–10.5)
Chloride: 106 meq/L (ref 96–112)
Creatinine, Ser: 0.79 mg/dL (ref 0.40–1.20)
GFR: 73.66 mL/min (ref >60.00)
Glucose, Bld: 96 mg/dL (ref 70–99)
Potassium: 3.2 meq/L — ABNORMAL LOW (ref 3.5–5.1)
Sodium: 140 meq/L (ref 135–145)
Total Bilirubin: 0.4 mg/dL (ref 0.2–1.2)
Total Protein: 6.7 g/dL (ref 6.0–8.3)

## 2024-07-11 LAB — LIPID PANEL
Cholesterol: 206 mg/dL — ABNORMAL HIGH (ref 0–200)
HDL: 48.3 mg/dL (ref >39.00)
LDL Cholesterol: 98 mg/dL (ref 0–99)
NonHDL: 157.65
Total CHOL/HDL Ratio: 4
Triglycerides: 300 mg/dL — ABNORMAL HIGH (ref 0.0–149.0)
VLDL: 60 mg/dL — ABNORMAL HIGH (ref 0.0–40.0)

## 2024-07-11 LAB — VITAMIN D 25 HYDROXY (VIT D DEFICIENCY, FRACTURES): VITD: 28.87 ng/mL — ABNORMAL LOW (ref 30.00–100.00)

## 2024-07-11 LAB — VITAMIN B12: Vitamin B-12: 330 pg/mL (ref 211–911)

## 2024-07-11 LAB — HEMOGLOBIN A1C: Hgb A1c MFr Bld: 5.4 % (ref 4.6–6.5)

## 2024-07-11 LAB — TSH: TSH: 2.2 u[IU]/mL (ref 0.35–5.50)

## 2024-07-11 NOTE — Patient Instructions (Addendum)
 Referral placed to Dr. Sheena today. I would give her office a call to get scheduled.   We are checking labs today, will be in contact with any results that require further attention  Follow up with me in about 3 months for labs and medication management, sooner if needed.

## 2024-07-11 NOTE — Progress Notes (Signed)
 Established Patient Office Visit  Subjective:     Patient ID: Kimberly Ray, female    DOB: June 16, 1951, 73 y.o.   MRN: [id number]  Chief Complaint  Patient presents with   Follow-up    HPI  Discussed the use of AI scribe software for clinical note transcription with the patient, who gave verbal consent to proceed.  History of Present Illness Kimberly Ray is a 73 year old female with stage 3 kidney disease and cognitive issues who presents for follow-up on her medical conditions.  Chronic kidney disease and urinary incontinence - Stage 3 chronic kidney disease. - Severe urinary incontinence. - Ankle implant initially provided relief for incontinence but is no longer effective. - Uses high absorbency pads for incontinence management. - Considering Botox treatment for incontinence. - Urology appointment scheduled.  Cognitive impairment - Participating in a clinical trial for cognitive issues with significant improvement. - Lumbar puncture in September showed no change from the start of the trial. - PET scan findings inconsistent with her diagnosis.  Cardiac valvular disease and exertional dyspnea - Leaky heart valve with no follow-up in over two years. - Intermittent exertional dyspnea. - Engages in gentle exercise classes.  Atherosclerosis and hyperlipidemia - History of atherosclerosis previously noted by a healthcare provider, unclear documentation. - Previously used Repatha  for cholesterol management, but it is no longer covered by insurance. - Exploring options to resume Repatha . - Discontinued a medication due to joint pain.  Obesity and weight management - Recent weight gain. - Interest in weight loss. - Emotional eating and sugar intake contribute to weight gain. - Previous successful weight loss through dietary changes and increased activity.  Sleep apnea and daytime somnolence - Diagnosed with sleep apnea. - Uses Provigil , resulting in  improved daytime alertness and motivation.  Immunization and infectious disease history - Received influenza and COVID-19 vaccines this year. - Experienced mild COVID-19 symptoms on two occasions.     ROS Per HPI      Objective:    BP 126/84 (BP Location: Left Arm, Patient Position: Sitting)   Pulse 71   Temp 97.9 F (36.6 C) (Temporal)   Ht 5' 6 (1.676 m)   Wt 189 lb (85.7 kg)   SpO2 95%   BMI 30.51 kg/m    Physical Exam Vitals and nursing note reviewed.  Constitutional:      General: She is not in acute distress.    Appearance: Normal appearance.  HENT:     Head: Normocephalic and atraumatic.     Right Ear: External ear normal.     Left Ear: External ear normal.     Nose: Nose normal.     Mouth/Throat:     Mouth: Mucous membranes are moist.     Pharynx: Oropharynx is clear.  Eyes:     Extraocular Movements: Extraocular movements intact.     Pupils: Pupils are equal, round, and reactive to light.  Cardiovascular:     Rate and Rhythm: Normal rate and regular rhythm.     Pulses: Normal pulses.     Heart sounds: Normal heart sounds.  Pulmonary:     Effort: Pulmonary effort is normal. No respiratory distress.     Breath sounds: Normal breath sounds. No wheezing, rhonchi or rales.  Musculoskeletal:        General: Normal range of motion.     Cervical back: Normal range of motion.     Right lower leg: No edema.     Left lower leg: No  edema.  Lymphadenopathy:     Cervical: No cervical adenopathy.  Neurological:     General: No focal deficit present.     Mental Status: She is alert and oriented to person, place, and time.  Psychiatric:        Mood and Affect: Mood normal.        Thought Content: Thought content normal.     Results for orders placed or performed in visit on 07/11/24  CBC with Differential/Platelet  Result Value Ref Range   WBC 7.0 4.0 - 10.5 K/uL   RBC 4.32 3.87 - 5.11 Mil/uL   Hemoglobin 12.8 12.0 - 15.0 g/dL   HCT 61.8 63.9 - 53.9 %    MCV 88.3 78.0 - 100.0 fl   MCHC 33.6 30.0 - 36.0 g/dL   RDW 85.3 88.4 - 84.4 %   Platelets 252.0 150.0 - 400.0 K/uL   Neutrophils Relative % 64.8 43.0 - 77.0 %   Lymphocytes Relative 23.4 12.0 - 46.0 %   Monocytes Relative 10.1 3.0 - 12.0 %   Eosinophils Relative 1.3 0.0 - 5.0 %   Basophils Relative 0.4 0.0 - 3.0 %   Neutro Abs 4.6 1.4 - 7.7 K/uL   Lymphs Abs 1.6 0.7 - 4.0 K/uL   Monocytes Absolute 0.7 0.1 - 1.0 K/uL   Eosinophils Absolute 0.1 0.0 - 0.7 K/uL   Basophils Absolute 0.0 0.0 - 0.1 K/uL  Comprehensive metabolic panel with GFR  Result Value Ref Range   Sodium 140 135 - 145 mEq/L   Potassium 3.2 (L) 3.5 - 5.1 mEq/L   Chloride 106 96 - 112 mEq/L   CO2 28 19 - 32 mEq/L   Glucose, Bld 96 70 - 99 mg/dL   BUN 18 6 - 23 mg/dL   Creatinine, Ser 9.20 0.40 - 1.20 mg/dL   Total Bilirubin 0.4 0.2 - 1.2 mg/dL   Alkaline Phosphatase 57 39 - 117 U/L   AST 38 (H) 0 - 37 U/L   ALT 31 0 - 35 U/L   Total Protein 6.7 6.0 - 8.3 g/dL   Albumin 4.5 3.5 - 5.2 g/dL   GFR 26.33 >39.99 mL/min   Calcium 8.9 8.4 - 10.5 mg/dL  Hemoglobin J8r  Result Value Ref Range   Hgb A1c MFr Bld 5.4 4.6 - 6.5 %  Lipid panel  Result Value Ref Range   Cholesterol 206 (H) 0 - 200 mg/dL   Triglycerides 699.9 (H) 0.0 - 149.0 mg/dL   HDL 51.69 >60.99 mg/dL   VLDL 39.9 (H) 0.0 - 59.9 mg/dL   LDL Cholesterol 98 0 - 99 mg/dL   Total CHOL/HDL Ratio 4    NonHDL 157.65   TSH  Result Value Ref Range   TSH 2.20 0.35 - 5.50 uIU/mL  Vitamin B12  Result Value Ref Range   Vitamin B-12 330 211 - 911 pg/mL  VITAMIN D  25 Hydroxy (Vit-D Deficiency, Fractures)  Result Value Ref Range   VITD 28.87 (L) 30.00 - 100.00 ng/mL    The 10-year ASCVD risk score (Arnett DK, et al., 2019) is: 25.6%         Assessment & Plan:   Assessment and Plan Assessment & Plan Mixed hyperlipidemia Repatha  effective but discontinued due to insurance. Joint pain reported but not linked to Repatha . - Consulted pharmacist for  Repatha  coverage solutions. - Continue appeals for Repatha  coverage. - Referred to cardiologist for further management.  Chronic kidney disease, stage 3a Advised against creatine and ketosis due to kidney  function concerns.  Urinary incontinence Persistent despite previous interventions. Considering Botox treatment. - Discuss Botox treatment with urologist.  Obstructive sleep apnea Managed with Provigil , effective in reducing daytime sleepiness. - Continue Provigil  for sleep apnea management.  Mild cognitive impairment Participating in clinical trial with positive results. Stable lumbar puncture results. PET scan results inconsistent with diagnosis. - Continue clinical trial participation. - Discuss PET scan results with neurologist.  Shortness of breath on exertion Intermittent symptoms possibly related to leaky heart valve. Not consistent during exercise. - Continue monitoring symptoms and report changes.  Leaky heart valve (mitral valve insufficiency) Intermittent shortness of breath related to mitral valve insufficiency. Follow-up overdue. - Sent updated referral to cardiologist for follow-up evaluation.  Obesity and abnormal weight gain Interest in weight loss strategies. GLP-1 agonists discussed but cost prohibitive. - Encouraged lifestyle modifications for weight loss.  Bipolar Depression, MDD, mod, rec - controlled fluoxetine , lamictal , wellbutrin   General Health Maintenance Up to date on vaccinations. Discussed timing for next pneumonia vaccine. - Continue annual flu and COVID vaccinations. - Plan for next pneumonia vaccine around 2029-2030.     Orders Placed This Encounter  Procedures   CBC with Differential/Platelet    Release to patient:   Immediate [1]   Comprehensive metabolic panel with GFR    Release to patient:   Immediate [1]   Hemoglobin A1c   Lipid panel   TSH   Vitamin B12   VITAMIN D  25 Hydroxy (Vit-D Deficiency, Fractures)   Ambulatory  referral to Cardiology    Referral Priority:   Routine    Referral Type:   Consultation    Referral Reason:   Specialty Services Required    Referred to Provider:   Tobb, Kardie, DO    Number of Visits Requested:   1     No orders of the defined types were placed in this encounter.   Return in about 3 months (around 10/11/2024) for meds OV.  Corean LITTIE Ku, FNP

## 2024-07-13 ENCOUNTER — Ambulatory Visit: Payer: Self-pay | Admitting: Family Medicine

## 2024-07-18 ENCOUNTER — Ambulatory Visit: Admitting: Neurology

## 2024-07-18 ENCOUNTER — Encounter: Payer: Self-pay | Admitting: Neurology

## 2024-07-18 VITALS — BP 134/73 | HR 77 | Ht 66.0 in | Wt 189.0 lb

## 2024-07-18 DIAGNOSIS — G3184 Mild cognitive impairment, so stated: Secondary | ICD-10-CM

## 2024-07-18 DIAGNOSIS — D508 Other iron deficiency anemias: Secondary | ICD-10-CM | POA: Diagnosis not present

## 2024-07-18 DIAGNOSIS — F339 Major depressive disorder, recurrent, unspecified: Secondary | ICD-10-CM

## 2024-07-18 DIAGNOSIS — G4733 Obstructive sleep apnea (adult) (pediatric): Secondary | ICD-10-CM | POA: Diagnosis not present

## 2024-07-18 DIAGNOSIS — G309 Alzheimer's disease, unspecified: Secondary | ICD-10-CM

## 2024-07-18 NOTE — Progress Notes (Addendum)
 Provider:  Dedra Gores, MD  Primary Care Physician:  Alvia Corean CROME, FNP [address] [place] [address]     Referring Provider: Alvia Corean CROME, Fnp [address] [place],  [place] [address]          Chief Complaint according to patient   Patient presents with:                HISTORY OF PRESENT ILLNESS:  Kimberly Ray is a 73 y.o. female patient who is here for revisit 07/18/2024 for  a discussion of memory work up results. This patient has been my patient for Sleep medicine while being seen by dr Jenel for general neurology.  and had been followed by [facility] for memory loss. She reports slow progression of memory loss, short term memory is most affected.  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    She reports visual misperceptions, yellow lines as drawn by crayon on the floor when there are none.  Sometimes the lines are oscillating.  Before the dementia trial , she would be sitting in her recliner and looked at her white curtains they appeared as if  pulsing.  If she looked at her fireplace ( covered by a mash metal screen )  she would see beautiful a kaleidoscope of colours, almost like a economist.  These visual hallucinations are not followed by headaches, by nausea nor by fatigue.  She reports bladder and bowel urge incontinence and has a nerve stimulator at the medial ankle implanted. Irritable bowel does make it worse.   I have low  iron levels and my GI doc thinks I may have a small  bleeding site, an ongoing seeping bleed somewhere. I have balance issues, I don't feel depressed ( GDS 1/ 15).   OSA : ESS : I think Modafinil  made me more alert and... more sociable.  I get a lot better sleep with my new machine. I will see Lauraine Born for sleep follow up specifically  in January 2025. Memory  follow up with NP in July-August and then yearly   HA : Some headaches felt as if to be shooting through the left eye , inside out - sharp ,lasting  just seconds. No lingering pain.  Had cataract surgery before the headaches arose.  HA have been a bit more frequent, not really new - had infrequently similar sensations over the years.   She had a history of ocular migraines.     Chief concern according to patient :   my son has been noticing slow progression before I was enrolled in the Dementia trial ; I have done better since - I feel the treatment was beneficial.  Was seen by Maude Rattler, Neuropsychologist at [facility] in 2024 and 2024 , he left the practice  but the patient would like to have a biannual session with neuropsychological testing.   Social HX; loves spending time with her 1 year old grandson. Lives independently, all ADL endorsed as her being independent.    I reviewed the patients available Test results and labs :  CLINICAL DATA:  Nonvascular etiology of memory loss suspected   EXAM: NUCLEAR MEDICINE PET BRAIN AMYLOID SCAN indicating only sparse or no neuritic beta- amyloid plaques which is inconsistent with a pathologic diagnosis of Alzheimer's disease. 05-17-2024.  Brain MRI : IMPRESSION: 1. No evidence of an acute intracranial abnormality. 2. No age advanced or lobar predominant parenchymal atrophy. ( I disagree- there is generalized widening of Sulci)  3. Mild chronic small vessel ischemic changes within the cerebral white matter and pons, slightly progressed from the prior brain MRI of 01/20/2020. 4. Mild paranasal sinus mucosal thickening.     Electronically Signed   By: Rockey Childs D.O.   On: 05/01/2023 18:56   LP ( Clinical trial ) Dementia clinical trial by Juliene Starlyn Lauraine Dina, PA at [facility] neurology:     EXAM:DIAGNOSTIC LUMBAR PUNCTURE UNDER FLUOROSCOPIC GUIDANCE   FLUOROSCOPY PROCEDURE:  Fifteen ml of CSF were obtained for  laboratory studies. The patient tolerated the procedure well and there were no apparent complications.   IMPRESSION: Lumbar puncture on the right at L3-4.  15 cc sample submitted.     Electronically Signed: Oneil Officer M.D.On: 03/16/2023 11:18    Previous MRI _ 01-20-2020 ORDERING CLINICIAN: Jenel Carlin POUR, MD  CLINICAL HISTORY: 73 year old female with memory and gait difficulty. Normal for age.     Review of Systems:  Out of a complete 14 system review, the patient complains of only the following symptoms, and all other reviewed systems are negative.:   SLEEPINESS ?  How likely are you to doze in the following situations: 0 = not likely, 1 = slight chance, 2 = moderate chance, 3 = high chance  Sitting and Reading? 1 Watching Television? Sitting inactive in a public place (theater or meeting)?1 Lying down in the afternoon when circumstances permit?1 Sitting and talking to someone? Sitting quietly after lunch without alcohol?1 In a car, while stopped for a few minutes in traffic? As a passenger in a car for an hour without a break?  Total = 4  ADL :All intact-  Activities except occasionally toileting accidents, these may be IBS related. .  Instrumental activities were all intact         Social History   Socioeconomic History   Marital status: Divorced    Spouse name: Not on file   Number of children: 2   Years of education: 13   Highest education level: Associate degree: occupational, scientist, product/process development, or vocational program  Occupational History    Employer: [employer]    Comment: Flight Attendant  Tobacco Use   Smoking status: Never   Smokeless tobacco: Never   Tobacco comments:    None  Vaping Use   Vaping status: Never Used  Substance and Sexual Activity   Alcohol use: Not Currently    Alcohol/week: 0.0 - 1.0 standard drinks of alcohol    Comment: Once a year   Drug use: No   Sexual activity: Not Currently    Birth control/protection: I.U.D.,  Pill, Post-menopausal  Other Topics Concern   Not on file  Social History Narrative   Patient is single and lives at home alone. Patient is flight attendant. Patient has college education one yearRight handed.Caffeine- Rare.    Drinks no caffeine   Social Drivers of Corporate Investment Banker Strain: Low Risk  (07/11/2024)   Overall Financial Resource Strain (CARDIA)    Difficulty of Paying Living Expenses: Not very hard  Food Insecurity: No Food Insecurity (07/11/2024)   Hunger Vital Sign    Worried About Running Out of Food in the Last Year: Never true    Ran Out of Food in the Last Year: Never true  Transportation Needs: No Transportation Needs (07/11/2024)  PRAPARE - Administrator, Civil Service (Medical): No    Lack of Transportation (Non-Medical): No  Physical Activity: Insufficiently Active (07/11/2024)   Exercise Vital Sign    Days of Exercise per Week: 4 days    Minutes of Exercise per Session: 30 min  Stress: Stress Concern Present (07/11/2024)   [employer] of Occupational Health - Occupational Stress Questionnaire    Feeling of Stress: To some extent  Social Connections: Moderately Isolated (07/11/2024)   Social Connection and Isolation Panel    Frequency of Communication with Friends and Family: More than three times a week    Frequency of Social Gatherings with Friends and Family: Twice a week    Attends Religious Services: Never    Database Administrator or Organizations: Yes    Attends Engineer, Structural: More than 4 times per year    Marital Status: Divorced    Family History  Problem Relation Age of Onset   Diabetes Mother    Hearing loss Mother    Hypertension Mother    Miscarriages / Stillbirths Mother    Heart failure Father    Dementia Father    Mood Disorder Father    Anxiety disorder Father    Arthritis Father    Depression Father    Heart disease Father    Hypertension Father    Diabetes Sister    Anxiety  disorder Sister    Depression Sister    Hearing loss Sister    Hypertension Sister    Kidney disease Sister    Obesity Sister    Osteoporosis Brother    Bipolar disorder Brother    Anxiety disorder Brother    Depression Brother    Breast cancer Maternal Aunt 45   Arrhythmia Maternal Aunt    Stomach cancer Maternal Aunt    Breast cancer Maternal Grandmother 73   Cancer Maternal Grandmother    Diabetes Maternal Grandmother    Stroke Maternal Grandmother    Heart failure Maternal Grandfather 38   Alcohol abuse Maternal Grandfather    Hearing loss Maternal Grandfather    Colon cancer Paternal Grandmother 27   Mental illness Paternal Grandmother    Cancer Paternal Grandmother    Breast cancer Cousin    Arrhythmia Maternal Aunt    Vision loss Son    Colon polyps Neg Hx    Esophageal cancer Neg Hx    Rectal cancer Neg Hx     Past Medical History:  Diagnosis Date   ADD (attention deficit disorder)    Adnexal mass 04/08/2020   Anemia    Cataract    Chronic kidney disease    Stage !!!   Depression    Extrinsic asthma 11/09/2014   Fatty liver    Gait abnormality 12/26/2019   GERD (gastroesophageal reflux disease)    Hay fever    Head trauma    Hyperlipidemia    IBS (irritable bowel syndrome)    MCI (mild cognitive impairment) with memory loss    Memory loss    Migraines    OSA on CPAP    Osteoporosis    Reflux    RLS (restless legs syndrome)    Sinus congestion    Sleep apnea     Past Surgical History:  Procedure Laterality Date   ABDOMINAL HYSTERECTOMY     APPENDECTOMY     BREAST BIOPSY Right 02/27/2014   Stereo- Benign   CHOLECYSTECTOMY  11/03/2017   Dr. Rubin   COSMETIC SURGERY  EYE SURGERY     FRACTURE SURGERY     JOINT REPLACEMENT     LAPAROSCOPIC HYSTERECTOMY  12/2017   NASAL SINUS SURGERY     ORIF ANKLE FRACTURE BIMALLEOLAR Left 03/14/2019   [facility], Dr. Irene   perforated eardrum     left side   TONSILLECTOMY AND ADENOIDECTOMY      URETEROSCOPY     for ureteral stone     Current Outpatient Medications on File Prior to Visit  Medication Sig Dispense Refill   alendronate (FOSAMAX) 70 MG tablet Take 70 mg by mouth once a week.     buPROPion  (WELLBUTRIN  XL) 300 MG 24 hr tablet Take 1 tablet Daily for Mood, Focus & Concentration. 90 tablet 3   FLUoxetine  (PROZAC ) 40 MG capsule Take 1 capsule (40 mg total) by mouth daily. 90 capsule 1   lamoTRIgine  (LAMICTAL ) 100 MG tablet TAKE 1 TABLET BY MOUTH DAILY 30 tablet 1   modafinil  (PROVIGIL ) 200 MG tablet Take 1 tablet (200 mg total) by mouth daily. 30 tablet 5   Multiple Vitamin (MULTIVITAMIN) capsule Take 1 capsule by mouth daily.     ondansetron  (ZOFRAN -ODT) 8 MG disintegrating tablet DISSOLVE 1 TABLET ON THE  TONGUE EVERY 8 HOURS AS  NEEDED FOR NAUSEA AND  VOMITING 60 tablet 0   pantoprazole  (PROTONIX ) 40 MG tablet Take  1 tablet 2 x / day  to Prevent Heartburn &  Indigestion 180 tablet 0   SUMAtriptan  (IMITREX ) 25 MG tablet TAKE 1 TO 2 TABLETS BY MOUTH WITH ONSET OF MIGRAINE. MAY REPEAT IN 2 HOURS IF HEADACHE PERSISTS. MAX OF 4 TABLETS IN 24 HOURS 30 tablet 3   No current facility-administered medications on file prior to visit.    Allergies  Allergen Reactions   Erythromycin Other (See Comments)    TBS   Iodinated Contrast Media Other (See Comments)    Hot flashes, itching scalp Hot flashes, itching scalp   Penicillins Hives and Other (See Comments)   Clarithromycin Other (See Comments)    Itching ankles   Brintellix [Vortioxetine ] Other (See Comments)    Itching   Cholestatin Other (See Comments)     DIAGNOSTIC DATA (LABS, IMAGING, TESTING) - I reviewed patient records, labs, notes, testing and imaging myself where available.  Lab Results  Component Value Date   WBC 7.0 07/11/2024   HGB 12.8 07/11/2024   HCT 38.1 07/11/2024   MCV 88.3 07/11/2024   PLT 252.0 07/11/2024      Component Value Date/Time   NA 140 07/11/2024 1520   NA 141 07/17/2022 1211    K 3.2 (L) 07/11/2024 1520   CL 106 07/11/2024 1520   CO2 28 07/11/2024 1520   GLUCOSE 96 07/11/2024 1520   BUN 18 07/11/2024 1520   BUN 25 07/17/2022 1211   CREATININE 0.79 07/11/2024 1520   CREATININE 1.09 (H) 02/17/2023 1132   CALCIUM 8.9 07/11/2024 1520   PROT 6.7 07/11/2024 1520   PROT 6.6 07/17/2022 1211   ALBUMIN 4.5 07/11/2024 1520   ALBUMIN 4.4 07/17/2022 1211   AST 38 (H) 07/11/2024 1520   ALT 31 07/11/2024 1520   ALKPHOS 57 07/11/2024 1520   BILITOT 0.4 07/11/2024 1520   BILITOT 0.2 07/17/2022 1211   GFRNONAA >60 01/02/2024 0021   GFRNONAA 50 (L) 11/29/2020 1538   GFRAA 58 (L) 11/29/2020 1538   Lab Results  Component Value Date   CHOL 206 (H) 07/11/2024   HDL 48.30 07/11/2024   LDLCALC 98 07/11/2024   TRIG  300.0 (H) 07/11/2024   CHOLHDL 4 07/11/2024   Lab Results  Component Value Date   HGBA1C 5.4 07/11/2024   Lab Results  Component Value Date   VITAMINB12 330 07/11/2024   Lab Results  Component Value Date   TSH 2.20 07/11/2024    PHYSICAL EXAM:  Vitals:   07/18/24 1017  BP: 134/73  Pulse: 77   No data found. Body mass index is 30.51 kg/m.   Wt Readings from Last 3 Encounters:  07/18/24 189 lb (85.7 kg)  07/11/24 189 lb (85.7 kg)  05/17/24 186 lb 12.8 oz (84.7 kg)     Ht Readings from Last 3 Encounters:  07/18/24 5' 6 (1.676 m)  07/11/24 5' 6 (1.676 m)  05/17/24 5' 6 (1.676 m)      General: The patient is awake, alert and appears not in acute distress and groomed. Head: Normocephalic, atraumatic.  Neck is supple.  Mallampati 3,  neck circumference:15 inches .  TMJ click. Nasal airflow is patent.  Wore braces,   Overbite Dwan is not seen.   She used to have a crowded lower jaw.   Dental status: biological  Cardiovascular:  Regular rate and cardiac rhythm by pulse, without distended neck veins. Respiratory: no shortness of breath  Skin:  Without evidence of ankle edema, or rash. Trunk: BMI is 30.5     NEUROLOGIC  EXAM: The patient is awake and alert, oriented to place and time.   Memory subjective described as improved since participation in memory clinical trial.  Attention span & concentration ability appears normal.      09/02/2022    2:21 PM 08/15/2021    9:17 AM 10/25/2020    1:12 PM 06/27/2020   10:19 AM 12/26/2019   11:40 AM 08/04/2019    3:48 PM  MMSE - Mini Mental State Exam  Orientation to time 4 5 5 5 5 5   Orientation to Place 5 5 5 5 5 5   Registration 3 3 3 3 3 3   Attention/ Calculation 2 2 4 4 2 5   Recall 3 3 3 2 3 3   Language- name 2 objects 2 2 2 2 2 2   Language- repeat 1 1 1 1 1 1   Language- follow 3 step command 3 3 3 3 3 3   Language- read & follow direction 1 1 1 1 1 1   Write a sentence 1 1 1 1 1 1   Copy design 1 1 1 1 1 1   Total score 26 27 29 28 27 30         07/18/2024   11:33 AM 02/29/2024    1:35 PM 09/01/2023    1:38 PM 07/14/2023   11:00 AM 09/14/2020   11:00 AM  Montreal Cognitive Assessment   Visuospatial/ Executive (0/5) 5 4 5 5 2   Naming (0/3) 3 3 3 3 3   Attention: Read list of digits (0/2) 2 2 1 2 1   Attention: Read list of letters (0/1) 1 1 1 1 1   Attention: Serial 7 subtraction starting at 100 (0/3) 3 2 3 2 2   Language: Repeat phrase (0/2) 1 0 2 1 1   Language : Fluency (0/1) 1 1 1 1  0  Abstraction (0/2) 0 2 2 2 1   Delayed Recall (0/5) 5 5 5 3 3   Orientation (0/6) 6 6 5 6 6   Total 27 26 28 26 20   Adjusted Score (based on education)    26 21    Speech is fluent,  without  dysarthria, dysphonia  or aphasia.  Mood and affect are concerned and appropriate .   Neurological Examination: Mental Status: Intact.  Language and speech are normal.  No obvious cognitive deficits.  Cranial Nerves II-XII: Intact. PERL. EOMI. VFF. No nystagmus.  No facial droop.  No masked face , no stare, normal blinking.  No ptosis.  Hearing is grossly intact bilaterally.  The tongue is normal and midline. Motor: Strengths reduced at hip flexion and core- unable to stand  up from a seated position with arms crossed in front of her. Muscle bulk and tone are normal. No tremors. No cogwheeling, no waxy resistance either.   Coordination: No ataxia or dysmetria on finger to nose, no TREMOR>    Sensory: Grossly intact throughout to all modalities. Vibration intact.   Reflexes: Normal and symmetric throughout.  No ankle clonus. Babinski's sign is absent bilaterally.   Gait and Station: Normal spontaneous gait, slightly slowed.  Cautious-   Good arm swing, not shuffling, not stooped.    ABNORMAL : Unable to perform tandem gait- almost falling,  turned with 4 steps 180 degrees.  Romberg's sign is POSITIVE>  severely swaying, almost falling..    ASSESSMENT AND PLAN :   73 y.o. year old female  here with  a desire to have [facility] evaluate her further for memory concerns and differential diagnosis of her cognitive problems.  She continues to score high on MOCA and  on MMSE ( which was almost insultingly easy for her ) .       1) Alzheimer's disease is not her diagnosis - amyloid negative PET scan . Therefore no need to discuss anti-amyloid therapy.   2) Vascular dementia is not present ;  by MRI imagines and by smooth progression and some recovery , not step wise decline.   There is some general brain atrophy, non- lobar.   3) Lewy body disease is a possibility due to vision abnormalities, visual and silent hallucination mostly squiggly lines or pulsating surfaces.  No formed delusions, no people in these visions.   Physically no Parkinsonian features. No longer on dopaminergic agonists ( Dr Jenel had prescribed Requip  for presumed RLS )  RLS was not evaluated in light of her recurrent iron deficiency.    4) cerebellar or vestibular balance problems/ has a history of vertigo, BPV.  Worst was getting up from sleep and laying down. Posterior vestibular system? Reportedly propulsive sensation and spinning sensation. Rinne Weber non- lateralizing.   PLAN :   I  will order a metabolic PET scan and issue a  referral back for testing by  neuropsychology.  I will ask for her memory trial results to be faxed to me- [facility].   Eli Lilly trial.   MOCA today was 27/ 30 points, still in normal range.   Rv in 8-9  months with her clinical trial results hopefully being made available.  MOCA testing again , but chose to use alternative MOCA test  edition with farm animals, etc.  After that , the patient will follow up once a year. We may repeat an ATN test in 12 - 24 months.    I would like to thank  Dr Juliene Dunnings, DO, and  Alvia Corean CROME, Fnp [address] [place],  [place] [address] for allowing me to meet with this pleasant patient.  I will provide Sleep medical care in the future and [facility] will offer a cognitive function follow up once a year.    The referring  provider will be notified of the test results.   The patient's condition requires frequent monitoring and adjustments in the treatment plan, reflecting the ongoing complexity of care.  This provider is the continuing focal point for all needed services for this condition.  After spending a total time of  45  minutes face to face and time for  history taking, physical and neurologic examination, review of laboratory studies,  personal review of imaging studies, reports and results of other testing and review of referral information / records as far as provided in visit,   Electronically signed by: Dedra Gores, MD 07/18/2024 10:27 AM  [facility] Board certified by The [employer] of Sleep Medicine and Diplomate of the [employer] of Sleep Medicine. Board certified In Neurology through the ABPN, Fellow of the [employer] of Neurology.

## 2024-07-18 NOTE — Patient Instructions (Signed)
 Eli Lilly trial.   Rv in 8 months with her trial results being made available.    MOCA again.

## 2024-07-19 ENCOUNTER — Telehealth: Payer: Self-pay | Admitting: Neurology

## 2024-07-19 NOTE — Telephone Encounter (Signed)
 [employer] medicare shara: [id number] exp. 09/02/24 sent to [facility] [phone]

## 2024-07-21 ENCOUNTER — Encounter: Payer: Self-pay | Admitting: Cardiology

## 2024-07-22 ENCOUNTER — Encounter: Payer: Self-pay | Admitting: Internal Medicine

## 2024-07-22 ENCOUNTER — Other Ambulatory Visit: Payer: Self-pay

## 2024-07-22 DIAGNOSIS — D500 Iron deficiency anemia secondary to blood loss (chronic): Secondary | ICD-10-CM

## 2024-07-22 MED ORDER — DICYCLOMINE HCL 10 MG PO CAPS: 10.0000 mg | ORAL_CAPSULE | Freq: Four times a day (QID) | ORAL | 0 refills | Status: AC | PRN

## 2024-07-22 NOTE — Telephone Encounter (Signed)
 Her vitamin D  is slightly low and she should have this replaced; I would recommend at least 2000 international units of vitamin D  daily --unless her other providers have recommended a different replacement dose Her B12 is within normal limits  Lets have her repeat IBC panel plus ferritin --her hemoglobin when checked a week ago was normal which is reassuring  She can try low-dose dicyclomine  10 mg every 6-8 hours as needed for IBS/loose stools Office visit for follow-up with Colleen or myself  JMP

## 2024-08-01 ENCOUNTER — Ambulatory Visit: Payer: Self-pay | Admitting: Neurology

## 2024-08-01 ENCOUNTER — Encounter (HOSPITAL_COMMUNITY): Admission: RE | Admit: 2024-08-01 | Discharge: 2024-08-01 | Attending: Neurology | Admitting: Neurology

## 2024-08-01 DIAGNOSIS — G309 Alzheimer's disease, unspecified: Secondary | ICD-10-CM | POA: Diagnosis present

## 2024-08-01 LAB — GLUCOSE, CAPILLARY: Glucose-Capillary: 113 mg/dL — ABNORMAL HIGH (ref 70–99)

## 2024-08-01 MED ORDER — FLUDEOXYGLUCOSE F - 18 (FDG) INJECTION
10.0000 | Freq: Once | INTRAVENOUS | Status: AC
  Administered 2024-08-01: 11:00:00 9.65 via INTRAVENOUS

## 2024-08-03 ENCOUNTER — Other Ambulatory Visit

## 2024-08-03 ENCOUNTER — Encounter: Payer: Self-pay | Admitting: Nurse Practitioner

## 2024-08-03 ENCOUNTER — Ambulatory Visit: Payer: Self-pay | Admitting: Neurology

## 2024-08-03 ENCOUNTER — Ambulatory Visit: Payer: Self-pay | Admitting: Nurse Practitioner

## 2024-08-03 ENCOUNTER — Ambulatory Visit: Admitting: Nurse Practitioner

## 2024-08-03 VITALS — BP 114/70 | HR 84 | Ht 66.0 in | Wt 182.5 lb

## 2024-08-03 DIAGNOSIS — D509 Iron deficiency anemia, unspecified: Secondary | ICD-10-CM

## 2024-08-03 DIAGNOSIS — K589 Irritable bowel syndrome without diarrhea: Secondary | ICD-10-CM | POA: Diagnosis not present

## 2024-08-03 LAB — IBC + FERRITIN
Ferritin: 229.3 ng/mL (ref 10.0–291.0)
Iron: 108 ug/dL (ref 42–145)
Saturation Ratios: 30.5 % (ref 20.0–50.0)
TIBC: 354.2 ug/dL (ref 250.0–450.0)
Transferrin: 253 mg/dL (ref 212.0–360.0)

## 2024-08-03 LAB — CBC
HCT: 41.1 % (ref 36.0–46.0)
Hemoglobin: 13.7 g/dL (ref 12.0–15.0)
MCHC: 33.4 g/dL (ref 30.0–36.0)
MCV: 88 fl (ref 78.0–100.0)
Platelets: 253 K/uL (ref 150.0–400.0)
RBC: 4.67 Mil/uL (ref 3.87–5.11)
RDW: 13.9 % (ref 11.5–15.5)
WBC: 5.8 K/uL (ref 4.0–10.5)

## 2024-08-03 NOTE — Progress Notes (Deleted)
 08/03/2024 Kimberly Ray [id number] [birth year]   Chief Complaint:  History of Present Illness:  Kimberly Ray is a 73 year old female with a past medical history of depression, sleep apnea (to start CPAP), migraine headaches, mild memory impairment, osteoporosis on Fosamax, kidney stones, CKD, IDA, GERD, bleeding ulcer and colon polyps. Past hysterectomy, cholecystectomy and appendectomy.    Discussed the use of AI scribe software for clinical note transcription with the patient, who gave verbal consent to proceed.  History of Present Illness   IDA received IV iron     Latest Ref Rng & Units 07/11/2024    3:20 PM 05/05/2024    3:49 PM 03/07/2024    2:23 PM  CBC  WBC 4.0 - 10.5 K/uL 7.0  7.2  6.8   Hemoglobin 12.0 - 15.0 g/dL 87.1  88.1  86.7   Hematocrit 36.0 - 46.0 % 38.1  36.1  40.6   Platelets 150.0 - 400.0 K/uL 252.0  260.0  311.0        Latest Ref Rng & Units 07/11/2024    3:20 PM 03/07/2024    2:23 PM 01/02/2024   12:21 AM  CMP  Glucose 70 - 99 mg/dL 96  95  94   BUN 6 - 23 mg/dL 18  32  27   Creatinine 0.40 - 1.20 mg/dL 9.20  9.09  9.02   Sodium 135 - 145 mEq/L 140  138  137   Potassium 3.5 - 5.1 mEq/L 3.2  4.2  3.3   Chloride 96 - 112 mEq/L 106  104  104   CO2 19 - 32 mEq/L 28  25  22    Calcium 8.4 - 10.5 mg/dL 8.9  88.5  9.1   Total Protein 6.0 - 8.3 g/dL 6.7  7.2    Total Bilirubin 0.2 - 1.2 mg/dL 0.4  0.4    Alkaline Phos 39 - 117 U/L 57  75    AST 0 - 37 U/L 38  43    ALT 0 - 35 U/L 31  38      Liver elastography 05/21/2023: FINDINGS: Liver: No focal lesion identified. Increased parenchymal echogenicity. Portal vein is patent on color Doppler imaging with normal direction of blood flow towards the liver.   ULTRASOUND HEPATIC ELASTOGRAPHY   Device: Siemens Helix VTQ   Patient position: Oblique   Transducer: [id number]   Number of measurements: 10   Hepatic segment:  8   Median kPa: 3.2   IQR: 0.9   IQR/Median kPa ratio: 0.3    Data quality: IQR/Median kPa ratio of 0.3 or greater indicates reduced accuracy   Diagnostic category:  < or = 5 kPa: high probability of being normal   The use of hepatic elastography is applicable to patients with viral hepatitis and non-alcoholic fatty liver disease. At this time, there is insufficient data for the referenced cut-off values and use in other causes of liver disease, including alcoholic liver disease. Patients, however, may be assessed by elastography and serve as their own reference standard/baseline.   In patients with non-alcoholic liver disease, the values suggesting compensated advanced chronic liver disease (cACLD) may be lower, and patients may need additional testing with elasticity results of 7-9 kPa.   Please note that abnormal hepatic elasticity and shear wave velocities may also be identified in clinical settings other than with hepatic fibrosis, such as: acute hepatitis, elevated right heart and central venous pressures including use of beta blockers, veno-occlusive disease (Budd-Chiari), infiltrative  processes such as mastocytosis/amyloidosis/infiltrative tumor/lymphoma, extrahepatic cholestasis, with hyperemia in the post-prandial state, and with liver transplantation. Correlation with patient history, laboratory data, and clinical condition recommended.   Diagnostic Categories:   < or =5 kPa: high probability of being normal   < or =9 kPa: in the absence of other known clinical signs, rules out cACLD   >9 kPa and ?13 kPa: suggestive of cACLD, but needs further testing   >13 kPa: highly suggestive of cACLD   > or =17 kPa: highly suggestive of cACLD with an increased probability of clinically significant portal hypertension   IMPRESSION: ULTRASOUND LIVER:   Increased parenchymal echogenicity, finding can be seen in the setting of hepatic steatosis.   ULTRASOUND HEPATIC ELASTOGRAPHY:   Median kPa:  3.2   Diagnostic category:  < or =  5 kPa: high probability of being normal   ECHO 07/18/2022: IMPRESSIONS Mild intracavitary gradient. Peak velocity 1.55 m/s. Peak gradient 9.7 mmHg. Left ventricular ejection fraction, by estimation, is 55 to 60%. Left ventricular ejection fraction by 2D MOD biplane is 57.5 %. Left ventricular ejection fraction by PLAX is 57 %. The left ventricle has normal function. The left ventricle has no regional wall motion abnormalities. There is mild concentric left ventricular hypertrophy. Left ventricular diastolic parameters are consistent with Grade I diastolic dysfunction (impaired relaxation). The average left ventricular global longitudinal strain is -19.8 %. The global longitudinal strain is normal. 1. Right ventricular systolic function is normal. The right ventricular size is normal. There is normal pulmonary artery systolic pressure. 2. The mitral valve is normal in structure. Trivial mitral valve regurgitation. No evidence of mitral stenosis. 3. The aortic valve is tricuspid. Aortic valve regurgitation is not visualized. No aortic stenosis is present. 4. The inferior vena cava is normal in size with greater than 50% respiratory variability, suggesting right atrial pressure of 3 mmHg.   Pulmonary Function test 11/18/2022: Normal    PAST GI PROCEDURES:  EGD 11/09/2023: - Normal esophagus. - 3 cm hiatal hernia. - Normal stomach. Biopsied. - Two angioectasias in the duodenum. This is the most likely and probable source of iron deficiency. - Biopsies were taken with a cold forceps for evaluation of celiac disease.  Colonoscopy 11/09/2023: - Ileal diverticulum. - The examined portion of the ileum was otherwise normal. - One 3 mm polyp in the transverse colon, removed with a cold snare. Resected and retrieved.  - One 3 mm polyp in the sigmoid colon, removed with a cold snare. Resected and retrieved.  - Moderate diverticulosis in the sigmoid colon, in the descending colon, in the transverse colon and at the  hepatic flexure.  - The distal rectum and anal verge are normal on retroflexion view.  - Patient has a contact number available for emergencies. The signs and symptoms of potential delayed complications were discussed with the patient. Return to normal activities tomorrow. Written discharge instructions were provided to the patient. - Resume previous diet. - Continue present medications. - Await pathology results. - See EGD report. - Small bowel angioectasias most likely explain low iron. - Repeat colonoscopy may be recommended for surveillance (versus discontinuation based on age at next screening interval). The colonoscopy date will be determined after pathology results from today's exam become available for review. - No recall colonoscopy secondary to age   1. Surgical [P], small bowel :      - SMALL INTESTINAL MUCOSA WITH NO SPECIFIC HISTOPATHOLOGIC CHANGES      - NEGATIVE FOR INCREASED INTRAEPITHELIAL LYMPHOCYTES OR VILLOUS  ARCHITECTURAL      CHANGES       2. Surgical [P], gastric :      - GASTRIC ANTRAL MUCOSA WITH MILD NONSPECIFIC REACTIVE GASTROPATHY      - GASTRIC OXYNTIC MUCOSA WITH NO SPECIFIC HISTOPATHOLOGIC CHANGES      - HELICOBACTER PYLORI-LIKE ORGANISMS ARE NOT IDENTIFIED ON ROUTINE H&E STAIN       3. Surgical [P], colon, transverse, sigmoid, polyp (2) :      - TUBULAR ADENOMA WITHOUT HIGH-GRADE DYSPLASIA OR MALIGNANCY      - HYPERPLASTIC POLYP(S)         EGD 08/28/2017 [facility]: Small hiatal hernia Normal stomach Normal duodenum   EGD 11/14/2015:  Small hiatal hernia Nodule found in the esophagus, biopsies Bilious gastric fluid Normal stomach Multiple nonbleeding duodenal ulcers   1. Surgical [P], gastric biopsy - BENIGN GASTRIC MUCOSA WITH MILD CHRONIC GASTRITIS. - NO INTESTINAL METAPLASIA OR HELICOBACTER PYLORI ORGANISMS IDENTIFIED. 2. Surgical [P], GE junction biopsy of nodule - BENIGN GASTROESOPHAGEAL JUNCTION MUCOSA WITH CHRONIC INFLAMMATION AND  HYPERPLASTIC EPITHELIAL CHANGES.   Colonoscopy by Dr. Albertus 10/13/2011: Normal terminal ileum  Moderate diverticulosis left colon Sessile hyperplastic rectal polyp     Current Medications, Allergies, Past Medical History, Past Surgical History, Family History and Social History were reviewed in [employer] electronic medical record.   Review of Systems:   Constitutional: Negative for fever, sweats, chills or weight loss.  Respiratory: Negative for shortness of breath.   Cardiovascular: Negative for chest pain, palpitations and leg swelling.  Gastrointestinal: See HPI.  Musculoskeletal: Negative for back pain or muscle aches.  Neurological: Negative for dizziness, headaches or paresthesias.    Physical Exam: There were no vitals taken for this visit. General: in no acute distress. Head: Normocephalic and atraumatic. Eyes: No scleral icterus. Conjunctiva pink . Ears: Normal auditory acuity. Mouth: Dentition intact. No ulcers or lesions.  Lungs: Clear throughout to auscultation. Heart: Regular rate and rhythm, no murmur. Abdomen: Soft, nontender and nondistended. No masses or hepatomegaly. Normal bowel sounds x 4 quadrants.  Rectal: *** Musculoskeletal: Symmetrical with no gross deformities. Extremities: No edema. Neurological: Alert oriented x 4. No focal deficits.  Psychological: Alert and cooperative. Normal mood and affect  Assessment and Recommendations: ***

## 2024-08-03 NOTE — Progress Notes (Signed)
 08/03/2024 Kimberly Ray [id number] 04/28/1951   Chief Complaint: Loose stools   History of Present Illness: Kimberly Ray is a 73 year old female with a past medical history of depression, sleep apnea, migraine headaches, mild memory impairment enrolled in a clinical trial, osteoporosis on Fosamax, kidney stones, CKD, IDA, GERD, bleeding ulcer and colon polyps. Past hysterectomy, cholecystectomy and appendectomy.  She is known by Dr. Albertus.   Discussed the use of AI scribe software for clinical note transcription with the patient, who gave verbal consent to proceed.  History of Present Illness Kimberly Ray is a 73 year old female who presents for follow up regarding loose stools. She experiences gastrointestinal irregularity characterized by loose stools and urgency. She uses dicyclomine  as needed, approximately three to four times a week, which she feels helps reduce the urgency. Previously, she used Pepto Bismol and Imodium but was concerned about long-term use. No abdominal pain or bloody stools are present, although she had a brief episode of abdominal discomfort possibly related to diet about one to two months ago.  No recent antibiotics.  Dairy intake is limited.  She reports cognitive issues, which she describes as chronic, and is currently participating in a clinical trial for Alzheimer's disease. She notes an improvement in symptoms, including increased libido, which she attributes to the medication being tested.  She indicated recent testing showed evidence of Alzheimer's disease, and she is receiving care from a neurologist specializing in sleep studies. Her family, including her sons and mother, have observed significant improvement regarding her cognitive function over the past year.  She has been experiencing balance issues for several years, which have worsened over the past month. She acknowledges feeling foggy-headed at times, particularly today, and is  aware of the potential impact of her memory impairment on her ability to communicate effectively.  She has a history of IDA on Integra. She underwent an EGD and colonoscopy 11/09/2023.  The EGD identified a normal esophagus, a 3 cm hiatal hernia, the stomach was normal and 2 AVMs were noted in the duodenum which was likely the source of IDA.  Duodenal biopsies were negative for celiac disease.  Colonoscopy identified 2 polyps removed from the colon, path report consistent with tubular adenoma and hyperplastic polyps.  No further colonoscopies were recommended due to age.      Latest Ref Rng & Units 07/11/2024    3:20 PM 05/05/2024    3:49 PM 03/07/2024    2:23 PM  CBC  WBC 4.0 - 10.5 K/uL 7.0  7.2  6.8   Hemoglobin 12.0 - 15.0 g/dL 87.1  88.1  86.7   Hematocrit 36.0 - 46.0 % 38.1  36.1  40.6   Platelets 150.0 - 400.0 K/uL 252.0  260.0  311.0        Latest Ref Rng & Units 07/11/2024    3:20 PM 03/07/2024    2:23 PM 01/02/2024   12:21 AM  CMP  Glucose 70 - 99 mg/dL 96  95  94   BUN 6 - 23 mg/dL 18  32  27   Creatinine 0.40 - 1.20 mg/dL 9.20  9.09  9.02   Sodium 135 - 145 mEq/L 140  138  137   Potassium 3.5 - 5.1 mEq/L 3.2  4.2  3.3   Chloride 96 - 112 mEq/L 106  104  104   CO2 19 - 32 mEq/L 28  25  22    Calcium 8.4 - 10.5 mg/dL 8.9  11.4  9.1   Total Protein 6.0 - 8.3 g/dL 6.7  7.2    Total Bilirubin 0.2 - 1.2 mg/dL 0.4  0.4    Alkaline Phos 39 - 117 U/L 57  75    AST 0 - 37 U/L 38  43    ALT 0 - 35 U/L 31  38       ECHO 07/18/2022: IMPRESSIONS Mild intracavitary gradient. Peak velocity 1.55 m/s. Peak gradient 9.7 mmHg. Left ventricular ejection fraction, by estimation, is 55 to 60%. Left ventricular ejection fraction by 2D MOD biplane is 57.5 %. Left ventricular ejection fraction by PLAX is 57 %. The left ventricle has normal function. The left ventricle has no regional wall motion abnormalities. There is mild concentric left ventricular hypertrophy. Left ventricular diastolic  parameters are consistent with Grade I diastolic dysfunction (impaired relaxation). The average left ventricular global longitudinal strain is -19.8 %. The global longitudinal strain is normal. 1. Right ventricular systolic function is normal. The right ventricular size is normal. There is normal pulmonary artery systolic pressure. 2. The mitral valve is normal in structure. Trivial mitral valve regurgitation. No evidence of mitral stenosis. 3. The aortic valve is tricuspid. Aortic valve regurgitation is not visualized. No aortic stenosis is present. 4. The inferior vena cava is normal in size with greater than 50% respiratory variability, suggesting right atrial pressure of 3 mmHg.   Pulmonary Function test 11/18/2022: Normal    PAST GI PROCEDURES:  EGD 11/09/2023: - Normal esophagus.  - 3 cm hiatal hernia.  - Normal stomach. Biopsied.  - Two angioectasias in the duodenum. This is the most likely and probable source of iron deficiency.  - Biopsies were taken with a cold forceps for evaluation of celiac disease.  Colonoscopy 11/09/2023: - Ileal diverticulum.  - The examined portion of the ileum was otherwise normal.  - One 3 mm polyp in the transverse colon, removed with a cold snare. Resected and retrieved.  - One 3 mm polyp in the sigmoid colon, removed with a cold snare. Resected and retrieved.  - Moderate diverticulosis in the sigmoid colon, in the descending colon, in the transverse colon and at the hepatic flexure.  - The distal rectum and anal verge are normal on retroflexion view.  - No recall colonoscopy secondary to age   1. Surgical [P], small bowel :      - SMALL INTESTINAL MUCOSA WITH NO SPECIFIC HISTOPATHOLOGIC CHANGES      - NEGATIVE FOR INCREASED INTRAEPITHELIAL LYMPHOCYTES OR VILLOUS ARCHITECTURAL      CHANGES       2. Surgical [P], gastric :      - GASTRIC ANTRAL MUCOSA WITH MILD NONSPECIFIC REACTIVE GASTROPATHY      - GASTRIC OXYNTIC MUCOSA WITH NO SPECIFIC  HISTOPATHOLOGIC CHANGES      - HELICOBACTER PYLORI-LIKE ORGANISMS ARE NOT IDENTIFIED ON ROUTINE H&E STAIN       3. Surgical [P], colon, transverse, sigmoid, polyp (2) :      - TUBULAR ADENOMA WITHOUT HIGH-GRADE DYSPLASIA OR MALIGNANCY      - HYPERPLASTIC POLYP(S)   EGD 08/28/2017 [facility]: Small hiatal hernia Normal stomach Normal duodenum   EGD 11/14/2015:  Small hiatal hernia Nodule found in the esophagus, biopsies Bilious gastric fluid Normal stomach Multiple nonbleeding duodenal ulcers   1. Surgical [P], gastric biopsy - BENIGN GASTRIC MUCOSA WITH MILD CHRONIC GASTRITIS. - NO INTESTINAL METAPLASIA OR HELICOBACTER PYLORI ORGANISMS IDENTIFIED. 2. Surgical [P], GE junction biopsy of nodule - BENIGN GASTROESOPHAGEAL  JUNCTION MUCOSA WITH CHRONIC INFLAMMATION AND HYPERPLASTIC EPITHELIAL CHANGES.   Colonoscopy by Dr. Albertus 10/13/2011: Normal terminal ileum  Moderate diverticulosis left colon Sessile hyperplastic rectal polyp    Medications Ordered Prior to Encounter[1] Allergies[2]  Current Medications, Allergies, Past Medical History, Past Surgical History, Family History and Social History were reviewed in [employer] record.  Review of Systems:   Constitutional: Negative for fever, sweats, chills or weight loss.  Respiratory: Negative for shortness of breath.   Cardiovascular: Negative for chest pain, palpitations and leg swelling.  Gastrointestinal: See HPI.  Musculoskeletal: Negative for back pain or muscle aches.  Neurological: + Memory impairment and balance issues.   Physical Exam: BP 114/70   Pulse 84   Ht 5' 6 (1.676 m)   Wt 182 lb 8 oz (82.8 kg)   BMI 29.46 kg/m   General: 73 year old female in no acute distress. Head: Normocephalic and atraumatic. Eyes: No scleral icterus. Conjunctiva pink . Ears: Normal auditory acuity. Mouth: Dentition intact. No ulcers or lesions.  Lungs: Clear throughout to auscultation. Heart: Regular rate  and rhythm, no murmur. Abdomen: Soft, nontender and nondistended. No masses or hepatomegaly. Normal bowel sounds x 4 quadrants.  Rectal: Deferred. Musculoskeletal: Symmetrical with no gross deformities. Extremities: No edema. Neurological: Alert oriented x 4. No focal deficits.  Psychological: Alert and cooperative. Normal mood and affect  Assessment and Recommendations:  73 year old female with IBS, loose stools with intermittent increased urgency and mild cramping without significant abdominal pain.  - Dicyclomine  reduced the sensation of urgency, however, the patient noted having worsening issues with balance and feeling foggy headed for the past month therefore I have discontinued this medication. - IBgard 1 p.o. twice daily as needed - Patient instructed to eat a green banana daily which contains a natural constipating fiber pectin - Patient to contact me in 2 weeks with an update, may consider Benefiber and/or Cholestyramine if loose stools persist - To check stool cultures if loose stools occur daily - Patient instructed to take Lactaid 2 tabs with each dairy product  IDA. On Integra. EGD 10/2023 identified a normal esophagus, a 3 cm hiatal hernia, the stomach was normal and 2 AVMs were noted in the duodenum which was likely the source of IDA.  Duodenal biopsies were negative for celiac disease.  Colonoscopy identified 2 polyps removed from the colon, path report consistent with tubular adenoma and hyperplastic polyps.   - CBC, IBC + ferritin panel   History of colon polyps. Colonoscopy 10/2023 identified 2 polyps removed from the colon, path report consistent with tubular adenoma and hyperplastic polyps.   - No further colon polyp surveillance colonoscopies recommended due to age  GERD, stable  - Continue Pantoprazole  40 mg twice daily, to consider reducing to once daily after the holidays  Today's encounter was 25 minutes which included precharting, chart/result review,  history/exam, face-to-face time used for counseling, formulating a treatment plan with follow-up and documentation.     [1]  Current Outpatient Medications on File Prior to Visit  Medication Sig Dispense Refill   alendronate (FOSAMAX) 70 MG tablet Take 70 mg by mouth once a week.     buPROPion  (WELLBUTRIN  XL) 300 MG 24 hr tablet Take 1 tablet Daily for Mood, Focus & Concentration. 90 tablet 3   dicyclomine  (BENTYL ) 10 MG capsule Take 1 capsule (10 mg total) by mouth every 6 (six) hours as needed for spasms. 90 capsule 0   Fe Fum-FePoly-Vit C-Vit B3 (INTEGRA PO) Take 1 1e11 Vector  Genomes by mouth daily.     FLUoxetine  (PROZAC ) 40 MG capsule Take 1 capsule (40 mg total) by mouth daily. 90 capsule 1   lamoTRIgine  (LAMICTAL ) 100 MG tablet TAKE 1 TABLET BY MOUTH DAILY 30 tablet 1   modafinil  (PROVIGIL ) 200 MG tablet Take 1 tablet (200 mg total) by mouth daily. 30 tablet 5   Multiple Vitamin (MULTIVITAMIN) capsule Take 1 capsule by mouth daily.     ondansetron  (ZOFRAN -ODT) 8 MG disintegrating tablet DISSOLVE 1 TABLET ON THE  TONGUE EVERY 8 HOURS AS  NEEDED FOR NAUSEA AND  VOMITING 60 tablet 0   pantoprazole  (PROTONIX ) 40 MG tablet Take  1 tablet 2 x / day  to Prevent Heartburn &  Indigestion 180 tablet 0   SUMAtriptan  (IMITREX ) 25 MG tablet TAKE 1 TO 2 TABLETS BY MOUTH WITH ONSET OF MIGRAINE. MAY REPEAT IN 2 HOURS IF HEADACHE PERSISTS. MAX OF 4 TABLETS IN 24 HOURS 30 tablet 3   No current facility-administered medications on file prior to visit.  [2]  Allergies Allergen Reactions   Erythromycin Other (See Comments)    TBS   Iodinated Contrast Media Other (See Comments)    Hot flashes, itching scalp Hot flashes, itching scalp   Penicillins Hives and Other (See Comments)   Clarithromycin Other (See Comments)    Itching ankles   Brintellix [Vortioxetine ] Other (See Comments)    Itching   Cholestatin Other (See Comments)

## 2024-08-03 NOTE — Patient Instructions (Signed)
 Your provider has requested that you go to the basement level for lab work before leaving today. Press B on the elevator. The lab is located at the first door on the left as you exit the elevator.  Stop Dicyclomine   Take IB [place], over the counter - 1 twice a day as needed for abdominal cramping  Eat one green banana daily, which contains a natural constipating fiber called pectin.  Take Lactaid 2 tablets with every dairy product  _______________________________________________________  If your blood pressure at your visit was 140/90 or greater, please contact your primary care physician to follow up on this.  _______________________________________________________  If you are age 24 or older, your body mass index should be between 23-30. Your Body mass index is 29.46 kg/m. If this is out of the aforementioned range listed, please consider follow up with your Primary Care Provider.  If you are age 14 or younger, your body mass index should be between 19-25. Your Body mass index is 29.46 kg/m. If this is out of the aformentioned range listed, please consider follow up with your Primary Care Provider.   ________________________________________________________  The [facility] providers would like to encourage you to use MYCHART to communicate with providers for non-urgent requests or questions.  Due to long hold times on the telephone, sending your provider a message by [facility] may be a faster and more efficient way to get a response.  Please allow 48 business hours for a response.  Please remember that this is for non-urgent requests.  _______________________________________________________  [facility] is using a team-based approach to care.  Your team is made up of your doctor and two to three APPS. Our APPS (Nurse Practitioners and Physician Assistants) work with your physician to ensure care continuity for you. They are fully qualified to address your health concerns and  develop a treatment plan. They communicate directly with your gastroenterologist to care for you. Seeing the Advanced Practice Practitioners on your physician's team can help you by facilitating care more promptly, often allowing for earlier appointments, access to diagnostic testing, procedures, and other specialty referrals.

## 2024-08-04 LAB — TISSUE TRANSGLUTAMINASE, IGA: (tTG) Ab, IgA: <1 U/mL

## 2024-08-04 LAB — IGA: Immunoglobulin A: 106 mg/dL (ref 70–320)

## 2024-08-31 ENCOUNTER — Encounter: Payer: Self-pay | Admitting: Cardiology

## 2024-08-31 ENCOUNTER — Ambulatory Visit

## 2024-08-31 ENCOUNTER — Ambulatory Visit: Attending: Cardiology | Admitting: Cardiology

## 2024-08-31 VITALS — BP 128/64 | HR 94 | Ht 66.0 in | Wt 186.6 lb

## 2024-08-31 DIAGNOSIS — R002 Palpitations: Secondary | ICD-10-CM

## 2024-08-31 DIAGNOSIS — R7303 Prediabetes: Secondary | ICD-10-CM | POA: Diagnosis not present

## 2024-08-31 DIAGNOSIS — N1831 Chronic kidney disease, stage 3a: Secondary | ICD-10-CM | POA: Diagnosis not present

## 2024-08-31 DIAGNOSIS — R0609 Other forms of dyspnea: Secondary | ICD-10-CM | POA: Diagnosis not present

## 2024-08-31 DIAGNOSIS — E781 Pure hyperglyceridemia: Secondary | ICD-10-CM | POA: Diagnosis not present

## 2024-08-31 DIAGNOSIS — G4733 Obstructive sleep apnea (adult) (pediatric): Secondary | ICD-10-CM

## 2024-08-31 MED ORDER — REPATHA SURECLICK 140 MG/ML ~~LOC~~ SOAJ: 140.0000 mg | SUBCUTANEOUS | 2 refills | Status: AC

## 2024-08-31 NOTE — Progress Notes (Unsigned)
 Enrolled patient for a 14 day Zio XT  monitor to be mailed to patients home

## 2024-08-31 NOTE — Progress Notes (Unsigned)
 " Cardiology Office Note:    Date:  09/02/2024   ID:  Kimberly Ray, DOB 1951/07/26, MRN [id number]  PCP:  Alvia Corean CROME, FNP  Cardiologist:  Dub Huntsman, DO  Electrophysiologist:  None   Referring MD: Alvia Corean CROME, FNP    I am having palpitations    History of Present Illness:    Kimberly Ray is a 74 y.o. female with a hx of OSA on CPAP, hypertriglyceridemia, stage III kidney disease, prediabetes, mild mitral regurgitation. Her last visit with me was in 07/2022 at that time she was still short of breath and had not responded to Lasix . I then referred her to Pulmonary to rule other pulmonary pathology.   She had been following up with Pulmonary, there was concern for reactive airway disease. Her PFTs were normal.  During her last visit in 11/2022 she reported palpitations.   Discussed the use of AI scribe software for clinical note transcription with the patient, who gave verbal consent to proceed  She reports occasional palpitations and consistent dyspnea on exertion, especially when lifting heavy objects.  Since her last visit her insurance company has not approved for Repatha .  Of note she shares with me that she has had progressive cognitive issues with memory loss for at least 18 months and is in a clinical trial of a medication intended to improve cerebral blood flow. Her neurologist suspects possible Lewy body dementia.  Past Medical History:  Diagnosis Date   ADD (attention deficit disorder)    Adnexal mass 04/08/2020   Anemia    Cataract    Chronic kidney disease    Stage !!!   Depression    Extrinsic asthma 11/09/2014   Fatty liver    Gait abnormality 12/26/2019   GERD (gastroesophageal reflux disease)    Hay fever    Head trauma    Hyperlipidemia    IBS (irritable bowel syndrome)    MCI (mild cognitive impairment) with memory loss    Memory loss    Migraines    OSA on CPAP    Osteoporosis    Reflux    RLS (restless legs  syndrome)    Sinus congestion    Sleep apnea     Past Surgical History:  Procedure Laterality Date   ABDOMINAL HYSTERECTOMY     APPENDECTOMY     BREAST BIOPSY Right 02/27/2014   Stereo- Benign   CHOLECYSTECTOMY  11/03/2017   Dr. Rubin   COSMETIC SURGERY     EYE SURGERY     FRACTURE SURGERY     JOINT REPLACEMENT     LAPAROSCOPIC HYSTERECTOMY  12/2017   NASAL SINUS SURGERY     ORIF ANKLE FRACTURE BIMALLEOLAR Left 03/14/2019   [facility], Dr. Irene   perforated eardrum     left side   TONSILLECTOMY AND ADENOIDECTOMY     URETEROSCOPY     for ureteral stone    Current Medications: Active Medications[1]   Allergies:   Erythromycin, Iodinated contrast media, Penicillins, Clarithromycin, Brintellix [vortioxetine ], and Cholestatin   Social History   Socioeconomic History   Marital status: Divorced    Spouse name: Not on file   Number of children: 2   Years of education: 13   Highest education level: Tax Adviser degree: occupational, scientist, product/process development, or vocational program  Occupational History    Employer: [employer]    Comment: Flight Attendant  Tobacco Use   Smoking status: Never   Smokeless tobacco: Never   Tobacco comments:    None  Vaping Use   Vaping status: Never Used  Substance and Sexual Activity   Alcohol use: Not Currently    Alcohol/week: 0.0 - 1.0 standard drinks of alcohol    Comment: Once a year   Drug use: No   Sexual activity: Not Currently    Birth control/protection: I.U.D., Pill, Post-menopausal  Other Topics Concern   Not on file  Social History Narrative   Patient is single and lives at home alone. Patient is flight attendant. Patient has college education one yearRight handed.Caffeine- Rare.    Drinks no caffeine   Social Drivers of Health   Tobacco Use: Low Risk (08/31/2024)   Patient History    Smoking Tobacco Use: Never    Smokeless Tobacco Use: Never    Passive Exposure: Not on file  Financial Resource Strain: Low Risk (07/11/2024)    Overall Financial Resource Strain (CARDIA)    Difficulty of Paying Living Expenses: Not very hard  Food Insecurity: No Food Insecurity (07/11/2024)   Epic    Worried About Programme Researcher, Broadcasting/film/video in the Last Year: Never true    Ran Out of Food in the Last Year: Never true  Transportation Needs: No Transportation Needs (07/11/2024)   Epic    Lack of Transportation (Medical): No    Lack of Transportation (Non-Medical): No  Physical Activity: Insufficiently Active (07/11/2024)   Exercise Vital Sign    Days of Exercise per Week: 4 days    Minutes of Exercise per Session: 30 min  Stress: Stress Concern Present (07/11/2024)   [employer] of Occupational Health - Occupational Stress Questionnaire    Feeling of Stress: To some extent  Social Connections: Moderately Isolated (07/11/2024)   Social Connection and Isolation Panel    Frequency of Communication with Friends and Family: More than three times a week    Frequency of Social Gatherings with Friends and Family: Twice a week    Attends Religious Services: Never    Database Administrator or Organizations: Yes    Attends Engineer, Structural: More than 4 times per year    Marital Status: Divorced  Depression (PHQ2-9): Low Risk (04/14/2024)   Depression (PHQ2-9)    PHQ-2 Score: 0  Recent Concern: Depression (PHQ2-9) - Medium Risk (03/07/2024)   Depression (PHQ2-9)    PHQ-2 Score: 8  Alcohol Screen: Low Risk (04/14/2024)   Alcohol Screen    Last Alcohol Screening Score (AUDIT): 0  Housing: Unknown (07/11/2024)   Epic    Unable to Pay for Housing in the Last Year: No    Number of Times Moved in the Last Year: Not on file    Homeless in the Last Year: No  Utilities: Not At Risk (04/14/2024)   Epic    Threatened with loss of utilities: No  Health Literacy: Adequate Health Literacy (04/14/2024)   B1300 Health Literacy    Frequency of need for help with medical instructions: Never     Family History: The patient's family  history includes Alcohol abuse in her maternal grandfather; Anxiety disorder in her brother, father, and sister; Arrhythmia in her maternal aunt and maternal aunt; Arthritis in her father; Bipolar disorder in her brother; Breast cancer in her cousin; Breast cancer (age of onset: 27) in her maternal aunt; Breast cancer (age of onset: 52) in her maternal grandmother; Cancer in her maternal grandmother and paternal grandmother; Colon cancer (age of onset: 63) in her paternal grandmother; Dementia in her father; Depression in her brother, father, and sister; Diabetes  in her maternal grandmother, mother, and sister; Hearing loss in her maternal grandfather, mother, and sister; Heart disease in her father; Heart failure in her father; Heart failure (age of onset: 46) in her maternal grandfather; Hypertension in her father, mother, and sister; Kidney disease in her sister; Mental illness in her paternal grandmother; Miscarriages / Stillbirths in her mother; Mood Disorder in her father; Obesity in her sister; Osteoporosis in her brother; Stomach cancer in her maternal aunt; Stroke in her maternal grandmother; Vision loss in her son. There is no history of Colon polyps, Esophageal cancer, or Rectal cancer.  ROS:   Review of Systems  Constitution: Negative for decreased appetite, fever and weight gain.  HENT: Negative for congestion, ear discharge, hoarse voice and sore throat.   Eyes: Negative for discharge, redness, vision loss in right eye and visual halos.  Cardiovascular: Negative for chest pain, dyspnea on exertion, leg swelling, orthopnea and palpitations.  Respiratory: Negative for cough, hemoptysis, shortness of breath and snoring.   Endocrine: Negative for heat intolerance and polyphagia.  Hematologic/Lymphatic: Negative for bleeding problem. Does not bruise/bleed easily.  Skin: Negative for flushing, nail changes, rash and suspicious lesions.  Musculoskeletal: Negative for arthritis, joint pain, muscle  cramps, myalgias, neck pain and stiffness.  Gastrointestinal: Negative for abdominal pain, bowel incontinence, diarrhea and excessive appetite.  Genitourinary: Negative for decreased libido, genital sores and incomplete emptying.  Neurological: Negative for brief paralysis, focal weakness, headaches and loss of balance.  Psychiatric/Behavioral: Negative for altered mental status, depression and suicidal ideas.  Allergic/Immunologic: Negative for HIV exposure and persistent infections.    EKGs/Labs/Other Studies Reviewed:    The following studies were reviewed today:   EKG:  The ekg ordered today demonstrates   Recent Labs: 01/02/2024: B Natriuretic Peptide 57.6 07/11/2024: ALT 31; BUN 18; Creatinine, Ser 0.79; Potassium 3.2; Sodium 140; TSH 2.20 08/03/2024: Hemoglobin 13.7; Platelets 253.0  Recent Lipid Panel    Component Value Date/Time   CHOL 206 (H) 07/11/2024 1520   CHOL 197 10/15/2021 1101   TRIG 300.0 (H) 07/11/2024 1520   HDL 48.30 07/11/2024 1520   HDL 48 10/15/2021 1101   CHOLHDL 4 07/11/2024 1520   VLDL 60.0 (H) 07/11/2024 1520   LDLCALC 98 07/11/2024 1520   LDLCALC 51 02/17/2023 1132    Physical Exam:    VS:  BP 128/64 (BP Location: Right Arm, Patient Position: Sitting, Cuff Size: Normal)   Pulse 94   Ht 5' 6 (1.676 m)   Wt 186 lb 9.6 oz (84.6 kg)   SpO2 96%   BMI 30.12 kg/m     Wt Readings from Last 3 Encounters:  08/31/24 186 lb 9.6 oz (84.6 kg)  08/03/24 182 lb 8 oz (82.8 kg)  07/18/24 189 lb (85.7 kg)     GEN: Well nourished, well developed in no acute distress HEENT: Normal NECK: No JVD; No carotid bruits LYMPHATICS: No lymphadenopathy CARDIAC: S1S2 noted,RRR, no murmurs, rubs, gallops RESPIRATORY:  Clear to auscultation without rales, wheezing or rhonchi  ABDOMEN: Soft, non-tender, non-distended, +bowel sounds, no guarding. EXTREMITIES: No edema, No cyanosis, no clubbing MUSCULOSKELETAL:  No deformity  SKIN: Warm and dry NEUROLOGIC:  Alert  and oriented x 3, non-focal PSYCHIATRIC:  Normal affect, good insight  ASSESSMENT:    1. DOE (dyspnea on exertion)   2. Palpitations   3. OSA on CPAP   4. Stage 3a chronic kidney disease (HCC)   5. Prediabetes   6. Hypertriglyceridemia    PLAN:    Palpitations Shortness  of breath OSA on CPAP Chronic kidney disease stage III Prediabetes Mild regurgitation Hypertriglyceridemia   She is experiencing intermittent symptoms for palpitations which has progressed over the last couple years and has not had any evaluation for this.  Think she would benefit from a ZIO monitor 14-day.  Her shortness of breath continues to progress which is out of proportion for her reactive airway disease, in the setting of her OSA I would like to have the patient get a stress echo to be able to understand her right sided pressures to rule out any pulmonary hypertension or increased pulmonary artery pressures on exertion.  Will restart her Repatha .  Recent triglycerides 300.  Blood work will be reassessed in 12 weeks.  Will avoid nephrotoxins  The patient is in agreement with the above plan. The patient left the office in stable condition.  The patient will follow up in   Medication Adjustments/Labs and Tests Ordered: Current medicines are reviewed at length with the patient today.  Concerns regarding medicines are outlined above.  Orders Placed This Encounter  Procedures   LONG TERM MONITOR (3-14 DAYS)   ECHOCARDIOGRAM STRESS TEST   Meds ordered this encounter  Medications   Evolocumab  (REPATHA  SURECLICK) 140 MG/ML SOAJ    Sig: Inject 140 mg into the skin every 14 (fourteen) days.    Dispense:  2 mL    Refill:  2    Patient Instructions  Medication Instructions:  Your physician has recommended you make the following change in your medication:  START: Repatha  140 mg /ml injected into the skin every 14 days.  *If you need a refill on your cardiac medications before your next appointment,  please call your pharmacy*  Testing/Procedures: Your physician has requested that you have a stress echocardiogram. For further information please visit [email or web address] Please follow instruction sheet as given.  Please note: We ask at that you not bring children with you during ultrasound (echo/ vascular) testing. Due to room size and safety concerns, children are not allowed in the ultrasound rooms during exams. Our front office staff cannot provide observation of children in our lobby area while testing is being conducted. An adult accompanying a patient to their appointment will only be allowed in the ultrasound room at the discretion of the ultrasound technician under special circumstances. We apologize for any inconvenience.  ZIO XT- Long Term Monitor Instructions  Your physician has requested you wear a ZIO patch monitor for 14 days.  This is a single patch monitor. [employer] supplies one patch monitor per enrollment. Additional stickers are not available. Please do not apply patch if you will be having a Nuclear Stress Test,  Echocardiogram, Cardiac CT, MRI, or Chest Xray during the period you would be wearing the  monitor. The patch cannot be worn during these tests. You cannot remove and re-apply the  ZIO XT patch monitor.  Your ZIO patch monitor will be mailed 3 day USPS to your address on file. It may take 3-5 days  to receive your monitor after you have been enrolled.  Once you have received your monitor, please review the enclosed instructions. Your monitor  has already been registered assigning a specific monitor serial # to you.  Billing and Patient Assistance Program Information  We have supplied [employer] with any of your insurance information on file for billing purposes. [employer] offers a sliding scale Patient Assistance Program for patients that do not have  insurance, or whose insurance does not completely cover the cost of the ZIO  monitor.  You must apply for the  Patient Assistance Program to qualify for this discounted rate.  To apply, please call [employer] at [phone], select option 4, select option 2, ask to apply for  Patient Assistance Program. Meredeth will ask your household income, and how many people  are in your household. They will quote your out-of-pocket cost based on that information.  [employer] will also be able to set up a 38-month, interest-Ray payment plan if needed.  Applying the monitor   Shave hair from upper left chest.  Hold abrader disc by orange tab. Rub abrader in 40 strokes over the upper left chest as  indicated in your monitor instructions.  Clean area with 4 enclosed alcohol pads. Let dry.  Apply patch as indicated in monitor instructions. Patch will be placed under collarbone on left  side of chest with arrow pointing upward.  Rub patch adhesive wings for 2 minutes. Remove white label marked 1. Remove the white  label marked 2. Rub patch adhesive wings for 2 additional minutes.  While looking in a mirror, press and release button in center of patch. A small green light will  flash 3-4 times. This will be your only indicator that the monitor has been turned on.  Do not shower for the first 24 hours. You may shower after the first 24 hours.  Press the button if you feel a symptom. You will hear a small click. Record Date, Time and  Symptom in the Patient Logbook.  When you are ready to remove the patch, follow instructions on the last 2 pages of Patient  Logbook. Stick patch monitor onto the last page of Patient Logbook.  Place Patient Logbook in the blue and white box. Use locking tab on box and tape box closed  securely. The blue and white box has prepaid postage on it. Please place it in the mailbox as  soon as possible. Your physician should have your test results approximately 7 days after the  monitor has been mailed back to [facility].  Call [facility] Customer Care at [phone] if you have  questions regarding  your ZIO XT patch monitor. Call them immediately if you see an orange light blinking on your  monitor.  If your monitor falls off in less than 4 days, contact our Monitor department at [phone].  If your monitor becomes loose or falls off after 4 days call [employer] at [phone] for  suggestions on securing your monitor   Follow-Up: At [facility], you and your health needs are our priority.  As part of our continuing mission to provide you with exceptional heart care, our providers are all part of one team.  This team includes your primary Cardiologist (physician) and Advanced Practice Providers or APPs (Physician Assistants and Nurse Practitioners) who all work together to provide you with the care you need, when you need it.  Your next appointment:   6 month(s)  Provider:   Sierah Lacewell, DO    Other Instructions:            Adopting a Healthy Lifestyle.  Know what a healthy weight is for you (roughly BMI <25) and aim to maintain this   Aim for 7+ servings of fruits and vegetables daily   65-80+ fluid ounces of water or unsweet tea for healthy kidneys   Limit to max 1 drink of alcohol per day; avoid smoking/tobacco   Limit animal fats in diet for cholesterol and heart health - choose grass fed whenever  available   Avoid highly processed foods, and foods high in saturated/trans fats   Aim for low stress - take time to unwind and care for your mental health   Aim for 150 min of moderate intensity exercise weekly for heart health, and weights twice weekly for bone health   Aim for 7-9 hours of sleep daily   When it comes to diets, agreement about the perfect plan isnt easy to find, even among the experts. Experts at the [facility] of [employer] developed an idea known as the Healthy Eating Plate. Just imagine a plate divided into logical, healthy portions.   The emphasis is on diet quality:   Load up on vegetables and fruits  - one-half of your plate: Aim for color and variety, and remember that potatoes dont count.   Go for whole grains - one-quarter of your plate: Whole wheat, barley, wheat berries, quinoa, oats, brown rice, and foods made with them. If you want pasta, go with whole wheat pasta.   Protein power - one-quarter of your plate: Fish, chicken, beans, and nuts are all healthy, versatile protein sources. Limit red meat.   The diet, however, does go beyond the plate, offering a few other suggestions.   Use healthy plant oils, such as olive, canola, soy, corn, sunflower and peanut. Check the labels, and avoid partially hydrogenated oil, which have unhealthy trans fats.   If youre thirsty, drink water. Coffee and tea are good in moderation, but skip sugary drinks and limit milk and dairy products to one or two daily servings.   The type of carbohydrate in the diet is more important than the amount. Some sources of carbohydrates, such as vegetables, fruits, whole grains, and beans-are healthier than others.   Finally, stay active  Signed, Munachimso Palin, DO  09/02/2024 8:17 AM    [facility]     [1]  Current Meds  Medication Sig   alendronate (FOSAMAX) 70 MG tablet Take 70 mg by mouth once a week.   buPROPion  (WELLBUTRIN  XL) 300 MG 24 hr tablet Take 1 tablet Daily for Mood, Focus & Concentration.   dicyclomine  (BENTYL ) 10 MG capsule Take 1 capsule (10 mg total) by mouth every 6 (six) hours as needed for spasms.   Evolocumab  (REPATHA  SURECLICK) 140 MG/ML SOAJ Inject 140 mg into the skin every 14 (fourteen) days.   Fe Fum-FePoly-Vit C-Vit B3 (INTEGRA PO) Take 1 1e11 Vector Genomes by mouth daily.   FLUoxetine  (PROZAC ) 40 MG capsule Take 1 capsule (40 mg total) by mouth daily.   lamoTRIgine  (LAMICTAL ) 100 MG tablet TAKE 1 TABLET BY MOUTH DAILY   modafinil  (PROVIGIL ) 200 MG tablet Take 1 tablet (200 mg total) by mouth daily.   Multiple Vitamin (MULTIVITAMIN) capsule Take 1  capsule by mouth daily.   ondansetron  (ZOFRAN -ODT) 8 MG disintegrating tablet DISSOLVE 1 TABLET ON THE  TONGUE EVERY 8 HOURS AS  NEEDED FOR NAUSEA AND  VOMITING   pantoprazole  (PROTONIX ) 40 MG tablet Take  1 tablet 2 x / day  to Prevent Heartburn &  Indigestion   SUMAtriptan  (IMITREX ) 25 MG tablet TAKE 1 TO 2 TABLETS BY MOUTH WITH ONSET OF MIGRAINE. MAY REPEAT IN 2 HOURS IF HEADACHE PERSISTS. MAX OF 4 TABLETS IN 24 HOURS   "

## 2024-08-31 NOTE — Patient Instructions (Signed)
 Medication Instructions:  Your physician has recommended you make the following change in your medication:  START: Repatha  140 mg /ml injected into the skin every 14 days.  *If you need a refill on your cardiac medications before your next appointment, please call your pharmacy*  Testing/Procedures: Your physician has requested that you have a stress echocardiogram. For further information please visit [email or web address] Please follow instruction sheet as given.  Please note: We ask at that you not bring children with you during ultrasound (echo/ vascular) testing. Due to room size and safety concerns, children are not allowed in the ultrasound rooms during exams. Our front office staff cannot provide observation of children in our lobby area while testing is being conducted. An adult accompanying a patient to their appointment will only be allowed in the ultrasound room at the discretion of the ultrasound technician under special circumstances. We apologize for any inconvenience.  ZIO XT- Long Term Monitor Instructions  Your physician has requested you wear a ZIO patch monitor for 14 days.  This is a single patch monitor. [employer] supplies one patch monitor per enrollment. Additional stickers are not available. Please do not apply patch if you will be having a Nuclear Stress Test,  Echocardiogram, Cardiac CT, MRI, or Chest Xray during the period you would be wearing the  monitor. The patch cannot be worn during these tests. You cannot remove and re-apply the  ZIO XT patch monitor.  Your ZIO patch monitor will be mailed 3 day USPS to your address on file. It may take 3-5 days  to receive your monitor after you have been enrolled.  Once you have received your monitor, please review the enclosed instructions. Your monitor  has already been registered assigning a specific monitor serial # to you.  Billing and Patient Assistance Program Information  We have supplied [employer] with any of your  insurance information on file for billing purposes. [employer] offers a sliding scale Patient Assistance Program for patients that do not have  insurance, or whose insurance does not completely cover the cost of the ZIO monitor.  You must apply for the Patient Assistance Program to qualify for this discounted rate.  To apply, please call [employer] at [phone], select option 4, select option 2, ask to apply for  Patient Assistance Program. Meredeth will ask your household income, and how many people  are in your household. They will quote your out-of-pocket cost based on that information.  [employer] will also be able to set up a 20-month, interest-free payment plan if needed.  Applying the monitor   Shave hair from upper left chest.  Hold abrader disc by orange tab. Rub abrader in 40 strokes over the upper left chest as  indicated in your monitor instructions.  Clean area with 4 enclosed alcohol pads. Let dry.  Apply patch as indicated in monitor instructions. Patch will be placed under collarbone on left  side of chest with arrow pointing upward.  Rub patch adhesive wings for 2 minutes. Remove white label marked 1. Remove the white  label marked 2. Rub patch adhesive wings for 2 additional minutes.  While looking in a mirror, press and release button in center of patch. A small green light will  flash 3-4 times. This will be your only indicator that the monitor has been turned on.  Do not shower for the first 24 hours. You may shower after the first 24 hours.  Press the button if you feel a symptom. You will hear a small click.  Record Date, Time and  Symptom in the Patient Logbook.  When you are ready to remove the patch, follow instructions on the last 2 pages of Patient  Logbook. Stick patch monitor onto the last page of Patient Logbook.  Place Patient Logbook in the blue and white box. Use locking tab on box and tape box closed  securely. The blue and white box has prepaid postage on  it. Please place it in the mailbox as  soon as possible. Your physician should have your test results approximately 7 days after the  monitor has been mailed back to [facility].  Call [facility] Customer Care at [phone] if you have questions regarding  your ZIO XT patch monitor. Call them immediately if you see an orange light blinking on your  monitor.  If your monitor falls off in less than 4 days, contact our Monitor department at [phone].  If your monitor becomes loose or falls off after 4 days call [employer] at [phone] for  suggestions on securing your monitor   Follow-Up: At [facility], you and your health needs are our priority.  As part of our continuing mission to provide you with exceptional heart care, our providers are all part of one team.  This team includes your primary Cardiologist (physician) and Advanced Practice Providers or APPs (Physician Assistants and Nurse Practitioners) who all work together to provide you with the care you need, when you need it.  Your next appointment:   6 month(s)  Provider:   Kardie Tobb, DO    Other Instructions:

## 2024-09-04 ENCOUNTER — Telehealth: Admitting: Family

## 2024-09-04 DIAGNOSIS — U071 COVID-19: Secondary | ICD-10-CM | POA: Diagnosis not present

## 2024-09-04 MED ORDER — NIRMATRELVIR/RITONAVIR (PAXLOVID)TABLET: 3.0000 | ORAL_TABLET | Freq: Two times a day (BID) | ORAL | 0 refills | Status: AC

## 2024-09-04 NOTE — Progress Notes (Signed)
 " Virtual Visit Consent   Kimberly Ray, you are scheduled for a virtual visit with a [place] provider today. Just as with appointments in the office, your consent must be obtained to participate. Your consent will be active for this visit and any virtual visit you may have with one of our providers in the next 365 days. If you have a MyChart account, a copy of this consent can be sent to you electronically.  As this is a virtual visit, video technology does not allow for your provider to perform a traditional examination. This may limit your provider's ability to fully assess your condition. If your provider identifies any concerns that need to be evaluated in person or the need to arrange testing (such as labs, EKG, etc.), we will make arrangements to do so. Although advances in technology are sophisticated, we cannot ensure that it will always work on either your end or our end. If the connection with a video visit is poor, the visit may have to be switched to a telephone visit. With either a video or telephone visit, we are not always able to ensure that we have a secure connection.  By engaging in this virtual visit, you consent to the provision of healthcare and authorize for your insurance to be billed (if applicable) for the services provided during this visit. Depending on your insurance coverage, you may receive a charge related to this service.  I need to obtain your verbal consent now. Are you willing to proceed with your visit today? Kimberly Ray has provided verbal consent on 09/04/2024 for a virtual visit (video or telephone). Bari Learn, FNP  Date: 09/04/2024 12:05 PM   Virtual Visit via Video Note   I, Bari Learn, connected with  Kimberly Ray  ([id number], [birth year]) on 09/04/24 at 12:00 PM EST by a video-enabled telemedicine application and verified that I am speaking with the correct person using two identifiers.  Location: Patient: Virtual  Visit Location Patient: Home Provider: Virtual Visit Location Provider: Home Office   I discussed the limitations of evaluation and management by telemedicine and the availability of in person appointments. The patient expressed understanding and agreed to proceed.    History of Present Illness: Kimberly Ray is a 74 y.o. who identifies as a female who was assigned female at birth, and is being seen today for COVID. Reports her symptoms started two days ago and tested positive yesterday.   HPI: URI  This is a new problem. The current episode started in the past 7 days. The problem has been gradually worsening. There has been no fever. Associated symptoms include congestion, coughing, ear pain (left), rhinorrhea, sinus pain and a sore throat. Pertinent negatives include no headaches, sneezing or wheezing. She has tried increased fluids (honey) for the symptoms. The treatment provided mild relief.    Problems:  Patient Active Problem List   Diagnosis Date Noted   Other specified metabolic disorders (HCC) 07/11/2024   IDA (iron deficiency anemia) 05/09/2024   Moderate obstructive sleep apnea-hypopnea syndrome 04/06/2024   Bipolar depression (HCC) 03/07/2024   Hyperlipidemia associated with type 2 diabetes mellitus (HCC) 03/07/2024   SOBOE (shortness of breath on exertion) 03/07/2024   Palpitations 03/07/2024   MCI (mild cognitive impairment) with memory loss    SOB (shortness of breath) 07/17/2022   Chest pain 07/17/2022   Medication management 07/17/2022   DOE (dyspnea on exertion) 07/17/2022   Nonrheumatic mitral valve regurgitation 09/25/2021   Prediabetes 09/25/2021  Hypertriglyceridemia 09/25/2021   Stage 3a chronic kidney disease (HCC) 09/25/2021   Seasonal allergies 02/12/2021   B12 deficiency 04/12/2020   Gait abnormality 12/26/2019   RLS (restless legs syndrome) 12/26/2019   Memory changes 08/04/2019   Osteopenia 04/06/2018   Fatty liver 08/29/2017   Circadian  rhythm sleep disorder 03/25/2017   Periodic limb movement disorder (PLMD) 03/25/2017   Obesity (BMI 30.0-34.9) 06/16/2015   Vitamin D  deficiency 11/09/2014   GERD  11/09/2014   Major depression, recurrent, chronic (HCC) 06/07/2014   OSA on CPAP    Mixed hyperlipidemia 06/29/2013   ADD (attention deficit disorder) 06/29/2013    Allergies: Allergies[1] Medications: Current Medications[2]  Observations/Objective: Patient is well-developed, well-nourished in no acute distress.  Resting comfortably  at home.  Head is normocephalic, atraumatic.  No labored breathing.  Speech is clear and coherent with logical content.  Patient is alert and oriented at baseline.    Assessment and Plan: 1. COVID-19 (Primary) - nirmatrelvir /ritonavir  (PAXLOVID ) 20 x 150 MG & 10 x 100MG  TABS; Take 3 tablets by mouth 2 (two) times daily for 5 days. (Take nirmatrelvir  150 mg two tablets twice daily for 5 days and ritonavir  100 mg one tablet twice daily for 5 days) Patient GFR is 73  Dispense: 30 tablet; Refill: 0  COVID positive, rest, force fluids, tylenol  as needed, report any worsening symptoms such as increased shortness of breath, swelling, or continued high fevers. Possible adverse effects discussed with antivirals.    Follow Up Instructions: I discussed the assessment and treatment plan with the patient. The patient was provided an opportunity to ask questions and all were answered. The patient agreed with the plan and demonstrated an understanding of the instructions.  A copy of instructions were sent to the patient via MyChart unless otherwise noted below.     The patient was advised to call back or seek an in-person evaluation if the symptoms worsen or if the condition fails to improve as anticipated.    Bari Learn, FNP    [1]  Allergies Allergen Reactions   Erythromycin Other (See Comments)    TBS   Iodinated Contrast Media Other (See Comments)    Hot flashes, itching scalp Hot  flashes, itching scalp   Penicillins Hives and Other (See Comments)   Clarithromycin Other (See Comments)    Itching ankles   Brintellix [Vortioxetine ] Other (See Comments)    Itching   Cholestatin Other (See Comments)  [2]  Current Outpatient Medications:    nirmatrelvir /ritonavir  (PAXLOVID ) 20 x 150 MG & 10 x 100MG  TABS, Take 3 tablets by mouth 2 (two) times daily for 5 days. (Take nirmatrelvir  150 mg two tablets twice daily for 5 days and ritonavir  100 mg one tablet twice daily for 5 days) Patient GFR is 73, Disp: 30 tablet, Rfl: 0   alendronate (FOSAMAX) 70 MG tablet, Take 70 mg by mouth once a week., Disp: , Rfl:    buPROPion  (WELLBUTRIN  XL) 300 MG 24 hr tablet, Take 1 tablet Daily for Mood, Focus & Concentration., Disp: 90 tablet, Rfl: 3   dicyclomine  (BENTYL ) 10 MG capsule, Take 1 capsule (10 mg total) by mouth every 6 (six) hours as needed for spasms., Disp: 90 capsule, Rfl: 0   Evolocumab  (REPATHA  SURECLICK) 140 MG/ML SOAJ, Inject 140 mg into the skin every 14 (fourteen) days., Disp: 2 mL, Rfl: 2   Fe Fum-FePoly-Vit C-Vit B3 (INTEGRA PO), Take 1 1e11 Vector Genomes by mouth daily., Disp: , Rfl:    FLUoxetine  (PROZAC ) 40  MG capsule, Take 1 capsule (40 mg total) by mouth daily., Disp: 90 capsule, Rfl: 1   lamoTRIgine  (LAMICTAL ) 100 MG tablet, TAKE 1 TABLET BY MOUTH DAILY, Disp: 30 tablet, Rfl: 1   modafinil  (PROVIGIL ) 200 MG tablet, Take 1 tablet (200 mg total) by mouth daily., Disp: 30 tablet, Rfl: 5   Multiple Vitamin (MULTIVITAMIN) capsule, Take 1 capsule by mouth daily., Disp: , Rfl:    ondansetron  (ZOFRAN -ODT) 8 MG disintegrating tablet, DISSOLVE 1 TABLET ON THE  TONGUE EVERY 8 HOURS AS  NEEDED FOR NAUSEA AND  VOMITING, Disp: 60 tablet, Rfl: 0   pantoprazole  (PROTONIX ) 40 MG tablet, Take  1 tablet 2 x / day  to Prevent Heartburn &  Indigestion, Disp: 180 tablet, Rfl: 0   SUMAtriptan  (IMITREX ) 25 MG tablet, TAKE 1 TO 2 TABLETS BY MOUTH WITH ONSET OF MIGRAINE. MAY REPEAT IN 2 HOURS IF  HEADACHE PERSISTS. MAX OF 4 TABLETS IN 24 HOURS, Disp: 30 tablet, Rfl: 3  "

## 2024-09-04 NOTE — Patient Instructions (Signed)
 COVID-19: What to Know COVID-19 is an infection caused by a virus called SARS-CoV-2. This type of virus is called a coronavirus. People with COVID-19 may: Have few to no symptoms. Have mild to moderate symptoms that affect their lungs and breathing. Get very sick. What are the causes?  COVID-19 is caused by a virus. This virus may be in the air as droplets or on surfaces. It can spread from an infected person when they cough, sneeze, speak, sing, or breathe. You may become infected if: You breathe in the infected droplets in the air. You touch an object that has the virus on it. What increases the risk? You are at risk of getting COVID-19 if you have been around someone with the infection. You may be more likely to get very sick if: You are 57 years old or older. You have certain medical conditions, such as: Heart disease. Diabetes. Long-term respiratory disease. Cancer. Pregnancy. You are immunocompromised. This means your body can't fight infections easily. You have a disability that makes it hard for you to move around, you have trouble moving, or you can't move at all. What are the signs or symptoms? People may have different symptoms from COVID-19. The symptoms can also be mild to very bad. They often show up in 5-6 days after being infected. But, they can take up to 14 days to appear. Common symptoms are: Cough. Feeling tired. New loss of taste or smell. Fever. Less common symptoms are: Sore throat. Headache. Body or muscle aches. Diarrhea. A skin rash or fingers or toes that are a different color than usual. Red or irritated eyes. Sometimes, COVID-19 does not cause symptoms. How is this diagnosed? COVID-19 can be diagnosed with tests done in the lab or at home. Fluid from your nose, mouth, or lungs will be used to check for the virus. How is this treated? Treatment for COVID-19 depends on how sick you are. Mild symptoms can be treated at home with rest, fluids, and  over-the-counter medicines. very bad symptoms may be treated in a hospital intensive care unit (ICU). If you have symptoms and are at risk of getting very sick, you may be given a medicine that fights viruses. This medicine is called an antiviral. How is this prevented? To protect yourself from COVID-19: Know your risk factors. Get vaccinated. If your body can't fight infections easily, talk to your provider about treatment to help prevent COVID-19. Stay at least about 3 feet (1 meter) away from other people. Wear mask that fits well when: You can't stay at a distance from people. You're in a place with not a lot of air flow. Try to be in open spaces with good air flow when you are in public. Wash your hands often or use an alcohol-based hand sanitizer. Cover your nose and mouth when you cough or sneeze. If you think you have COVID-19 or have been around someone who has it, stay home and away from other people as told by your provider or health officials. Where to find more information To learn more: Go to [employer]erPromos.no Click Health Topics. Type COVID-19 in the search box. Go to VisitDestination.com.br Click Health Topics. Then click All Topics. Type COVID-19 in the search box. Get help right away if: You have trouble breathing or get short of breath. You have pain or pressure in your chest. You're feeling confused. These symptoms may be an emergency. Get help right away. Call 911. Do not wait to see if the symptoms will go away.  Do not drive yourself to the hospital. This information is not intended to replace advice given to you by your health care provider. Make sure you discuss any questions you have with your health care provider. Document Revised: 05/07/2023 Document Reviewed: 04/29/2023 Elsevier Patient Education  2025 [employer].

## 2024-09-05 ENCOUNTER — Encounter: Payer: Self-pay | Admitting: Family Medicine

## 2024-09-20 ENCOUNTER — Encounter: Payer: Self-pay | Admitting: Family Medicine

## 2024-09-20 ENCOUNTER — Other Ambulatory Visit: Payer: Self-pay | Admitting: Internal Medicine

## 2024-09-20 ENCOUNTER — Encounter: Payer: Self-pay | Admitting: Internal Medicine

## 2024-09-20 ENCOUNTER — Encounter: Payer: Self-pay | Admitting: Neurology

## 2024-09-20 ENCOUNTER — Other Ambulatory Visit: Payer: Self-pay

## 2024-09-20 DIAGNOSIS — K219 Gastro-esophageal reflux disease without esophagitis: Secondary | ICD-10-CM

## 2024-09-20 DIAGNOSIS — Z79899 Other long term (current) drug therapy: Secondary | ICD-10-CM

## 2024-09-20 DIAGNOSIS — F339 Major depressive disorder, recurrent, unspecified: Secondary | ICD-10-CM

## 2024-09-20 DIAGNOSIS — F319 Bipolar disorder, unspecified: Secondary | ICD-10-CM

## 2024-09-20 MED ORDER — BUPROPION HCL ER (XL) 300 MG PO TB24: ORAL_TABLET | ORAL | 3 refills | Status: AC

## 2024-09-20 MED ORDER — LAMOTRIGINE 100 MG PO TABS: 100.0000 mg | ORAL_TABLET | Freq: Every day | ORAL | 1 refills | Status: AC

## 2024-09-22 ENCOUNTER — Other Ambulatory Visit: Payer: Self-pay | Admitting: Internal Medicine

## 2024-09-22 NOTE — Telephone Encounter (Signed)
 Kimberly Ray-  Please see multiple patient messages below and advise.  Additionally, patient is requesting refills on integra and pantoprazole .  Does she still need integra with recent normal IBC/ferritin testing?   Previously was getting pantoprazole  BID from P. Douglass, FNP. OK to send that prescription under us ?

## 2024-09-23 MED ORDER — INTEGRA 62.5-62.5-40-3 MG PO CAPS: 1.0000 | ORAL_CAPSULE | ORAL | 0 refills | Status: AC

## 2024-09-23 MED ORDER — PANTOPRAZOLE SODIUM 40 MG PO TBEC: 40.0000 mg | DELAYED_RELEASE_TABLET | Freq: Two times a day (BID) | ORAL | 1 refills | Status: AC

## 2024-09-23 NOTE — Telephone Encounter (Signed)
 Kimberly Ray, okay to refill Pantoprazole  40 mg 1 capsule p.o. twice daily to be taken 30 minutes before breakfast and dinner #180 with 1 refill.  Her iron levels were normal per labs 07/2024.  I recommend for her to decrease Integra to 1 tab p.o. every other day.  Please send Rx for Integra 1 tab p.o. every other day #45, no refills.  Please send patient to our lab in 6 weeks to check a CBC, IBC + ferritin panel. THX.

## 2024-09-23 NOTE — Telephone Encounter (Signed)
 Dottie in addition to the prior instructions regarding pantoprazole  and Integra, patient can continue Imodium or Pepto-Bismol as needed long-term.  She can try a probiotic OTC of her choice if she wishes to do so.  Thank you.

## 2024-09-29 ENCOUNTER — Ambulatory Visit: Admitting: Neurology

## 2024-10-05 ENCOUNTER — Ambulatory Visit (HOSPITAL_COMMUNITY)

## 2024-10-06 ENCOUNTER — Ambulatory Visit (HOSPITAL_COMMUNITY)

## 2024-10-06 ENCOUNTER — Other Ambulatory Visit (HOSPITAL_COMMUNITY)

## 2025-03-14 ENCOUNTER — Ambulatory Visit: Admitting: Neurology

## 2025-04-19 ENCOUNTER — Ambulatory Visit
